# Patient Record
Sex: Male | Born: 1935 | Race: White | Hispanic: No | Marital: Married | Smoking: Never smoker
Health system: Southern US, Community
[De-identification: ages and names within clinical notes are randomized; demographics above are authoritative.]

## PROBLEM LIST (undated history)

## (undated) DIAGNOSIS — R9389 Abnormal findings on diagnostic imaging of other specified body structures: Secondary | ICD-10-CM

## (undated) DIAGNOSIS — T8859XA Other complications of anesthesia, initial encounter: Secondary | ICD-10-CM

## (undated) DIAGNOSIS — G459 Transient cerebral ischemic attack, unspecified: Secondary | ICD-10-CM

## (undated) DIAGNOSIS — K259 Gastric ulcer, unspecified as acute or chronic, without hemorrhage or perforation: Secondary | ICD-10-CM

## (undated) DIAGNOSIS — B029 Zoster without complications: Secondary | ICD-10-CM

## (undated) DIAGNOSIS — Z950 Presence of cardiac pacemaker: Secondary | ICD-10-CM

## (undated) DIAGNOSIS — I6522 Occlusion and stenosis of left carotid artery: Secondary | ICD-10-CM

## (undated) DIAGNOSIS — E119 Type 2 diabetes mellitus without complications: Secondary | ICD-10-CM

## (undated) DIAGNOSIS — N390 Urinary tract infection, site not specified: Secondary | ICD-10-CM

## (undated) DIAGNOSIS — H01009 Unspecified blepharitis unspecified eye, unspecified eyelid: Secondary | ICD-10-CM

## (undated) DIAGNOSIS — Z9289 Personal history of other medical treatment: Secondary | ICD-10-CM

## (undated) DIAGNOSIS — C801 Malignant (primary) neoplasm, unspecified: Secondary | ICD-10-CM

## (undated) DIAGNOSIS — K922 Gastrointestinal hemorrhage, unspecified: Secondary | ICD-10-CM

## (undated) DIAGNOSIS — E6609 Other obesity due to excess calories: Secondary | ICD-10-CM

## (undated) DIAGNOSIS — J9601 Acute respiratory failure with hypoxia: Secondary | ICD-10-CM

## (undated) HISTORY — DX: Abnormal findings on diagnostic imaging of other specified body structures: R93.89

## (undated) HISTORY — PX: OTHER SURGICAL HISTORY: SHX169

## (undated) HISTORY — PX: SPINE SURGERY: SHX786

## (undated) HISTORY — PX: CATARACT EXTRACTION: SUR2

## (undated) HISTORY — DX: Type 2 diabetes mellitus without complications: E11.9

## (undated) HISTORY — PX: KNEE SURGERY: SHX244

## (undated) HISTORY — DX: Personal history of other medical treatment: Z92.89

## (undated) HISTORY — DX: Other obesity due to excess calories: E66.09

## (undated) HISTORY — PX: PROSTATE BIOPSY: SHX241

## (undated) HISTORY — PX: WISDOM TOOTH EXTRACTION: SHX21

---
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
## 2000-03-02 ENCOUNTER — Encounter: Payer: Self-pay | Admitting: Family Medicine

## 2000-03-02 ENCOUNTER — Encounter: Admission: RE | Admit: 2000-03-02 | Discharge: 2000-03-02 | Payer: Self-pay | Admitting: Family Medicine

## 2000-10-01 ENCOUNTER — Encounter: Admission: RE | Admit: 2000-10-01 | Discharge: 2000-12-30 | Payer: Self-pay | Admitting: Family Medicine

## 2001-04-05 ENCOUNTER — Ambulatory Visit (HOSPITAL_COMMUNITY): Admission: RE | Admit: 2001-04-05 | Discharge: 2001-04-06 | Payer: Self-pay | Admitting: Cardiovascular Disease

## 2001-04-06 ENCOUNTER — Encounter: Payer: Self-pay | Admitting: Cardiovascular Disease

## 2003-11-19 ENCOUNTER — Inpatient Hospital Stay (HOSPITAL_COMMUNITY): Admission: AD | Admit: 2003-11-19 | Discharge: 2003-11-20 | Payer: Self-pay | Admitting: Cardiovascular Disease

## 2003-11-19 HISTORY — PX: CARDIAC CATHETERIZATION: SHX172

## 2003-12-29 ENCOUNTER — Ambulatory Visit (HOSPITAL_COMMUNITY): Admission: RE | Admit: 2003-12-29 | Discharge: 2003-12-30 | Payer: Self-pay | Admitting: Cardiovascular Disease

## 2003-12-29 HISTORY — PX: CARDIAC CATHETERIZATION: SHX172

## 2004-02-01 ENCOUNTER — Encounter (HOSPITAL_COMMUNITY): Admission: RE | Admit: 2004-02-01 | Discharge: 2004-05-01 | Payer: Self-pay | Admitting: Cardiovascular Disease

## 2004-11-15 ENCOUNTER — Emergency Department (HOSPITAL_COMMUNITY): Admission: EM | Admit: 2004-11-15 | Discharge: 2004-11-15 | Payer: Self-pay | Admitting: Emergency Medicine

## 2005-12-17 ENCOUNTER — Inpatient Hospital Stay (HOSPITAL_COMMUNITY): Admission: EM | Admit: 2005-12-17 | Discharge: 2005-12-25 | Payer: Self-pay | Admitting: Emergency Medicine

## 2009-05-22 DIAGNOSIS — Z950 Presence of cardiac pacemaker: Secondary | ICD-10-CM

## 2009-05-22 HISTORY — DX: Presence of cardiac pacemaker: Z95.0

## 2010-03-31 ENCOUNTER — Ambulatory Visit (HOSPITAL_COMMUNITY): Admission: RE | Admit: 2010-03-31 | Discharge: 2010-03-31 | Payer: Self-pay | Admitting: Cardiovascular Disease

## 2010-06-29 ENCOUNTER — Emergency Department (HOSPITAL_COMMUNITY)
Admission: EM | Admit: 2010-06-29 | Discharge: 2010-06-29 | Disposition: A | Discharge status: Home / Self Care | Payer: Medicare Other | Attending: Emergency Medicine | Admitting: Emergency Medicine

## 2010-06-29 ENCOUNTER — Emergency Department (HOSPITAL_COMMUNITY): Payer: Medicare Other

## 2010-06-29 DIAGNOSIS — I2510 Atherosclerotic heart disease of native coronary artery without angina pectoris: Secondary | ICD-10-CM | POA: Insufficient documentation

## 2010-06-29 DIAGNOSIS — Z794 Long term (current) use of insulin: Secondary | ICD-10-CM | POA: Insufficient documentation

## 2010-06-29 DIAGNOSIS — K5641 Fecal impaction: Secondary | ICD-10-CM | POA: Insufficient documentation

## 2010-06-29 DIAGNOSIS — E119 Type 2 diabetes mellitus without complications: Secondary | ICD-10-CM | POA: Insufficient documentation

## 2010-06-29 DIAGNOSIS — I10 Essential (primary) hypertension: Secondary | ICD-10-CM | POA: Insufficient documentation

## 2010-06-29 DIAGNOSIS — K5900 Constipation, unspecified: Secondary | ICD-10-CM | POA: Insufficient documentation

## 2010-06-29 DIAGNOSIS — Z950 Presence of cardiac pacemaker: Secondary | ICD-10-CM | POA: Insufficient documentation

## 2010-06-29 LAB — URINALYSIS, ROUTINE W REFLEX MICROSCOPIC
Bilirubin Urine: NEGATIVE
Hgb urine dipstick: NEGATIVE
Ketones, ur: NEGATIVE mg/dL
Nitrite: NEGATIVE
Protein, ur: NEGATIVE mg/dL
Specific Gravity, Urine: 1.01 (ref 1.005–1.030)
Urine Glucose, Fasting: NEGATIVE mg/dL
Urobilinogen, UA: 0.2 mg/dL (ref 0.0–1.0)
pH: 6.5 (ref 5.0–8.0)

## 2010-08-02 LAB — GLUCOSE, CAPILLARY
Glucose-Capillary: 137 mg/dL — ABNORMAL HIGH (ref 70–99)
Glucose-Capillary: 182 mg/dL — ABNORMAL HIGH (ref 70–99)

## 2010-08-02 LAB — SURGICAL PCR SCREEN
MRSA, PCR: NEGATIVE
Staphylococcus aureus: NEGATIVE

## 2010-10-07 NOTE — Op Note (Signed)
[name], [name]                ACCOUNT NO.:  [id number]   MEDICAL RECORD NO.:  [id number]          PATIENT TYPE:  INP   LOCATION:  2911                         FACILITY:  [facility]   PHYSICIAN:  John C. Amedeo Plenty, M.D.    DATE OF BIRTH:  1932-02-19   DATE OF PROCEDURE:  12/18/2005  DATE OF DISCHARGE:                                 OPERATIVE REPORT   PROCEDURE:  Esophagogastroduodenoscopy.   INDICATIONS FOR PROCEDURE:  Suspected upper GI bleeding.   PROCEDURE:  The patient was placed in the left lateral decubitus position  and placed on the pulse monitor with continuous low-flow oxygen delivered by  nasal cannula.  He was sedated with 20 mcg IV fentanyl and 2 mg IV Versed.  The Olympus video endoscope was advanced under direct vision in the  oropharynx and esophagus.  The esophagus was straight and of normal caliber  with squamocolumnar line sharply outlined at 38 cm.  There was no visible  esophagitis, ring, stricture, Mallory-Weiss tear, esophageal varices or  other abnormalities in the esophagus.  Just distal to the GE junction,  however, there  is a large body of blackish blue clot that remained quite  adherent and resisted suctioning despite switching to the therapeutic scope.  This occupied a significant portion of the dependent portions of the fundus  and body.  A significant amount of time was spent trying to lavage and  suction fluid and clots.  The clots generally would not come through the  scope.  I was able to get a retroflex view of the immediate cardia all the  way around the squamocolumnar line but able to see almost none of the  fundus.  The significant portions of the body and particularly the entire  lesser curvature of the body were well seen as was the antrum and pylorus  and all appeared normal.  The duodenum was entered and both the bulb and  second portion were well inspected and appeared to be within normal limits.  Despite multiple attempts, I was unable to  dislodge the clot from the  proximal stomach and could not ascertain the source of bleeding.  It was  also unclear whether there was active bleeding going on.  I never saw any  fresh bright red blood but certainly, fluid around the clots was purple to  reddish.  The scope was then withdrawn and the patient returned to the  recovery room in stable condition.  He tolerated the procedure well.  There  were no immediate complications.   IMPRESSION:  Evidence of recent or active bleeding from the proximal  stomach.  Fundus of stomach not well seen.  Gastroesophageal  junction  relatively well seen and appeared free of any Mallory-Weiss tear, varices,  esophageal ulcer etc.   PLAN:  Intensive PPI therapy, IV Reglan and probable repeat and attempted  endoscopy while resuscitating with blood and fluid products as necessary.           ______________________________  Elyse Jarvis Amedeo Plenty, M.D.     JCH/MEDQ  D:  12/18/2005  T:  12/19/2005  Job:  [id number]   cc:   Jerelene Redden, MD   Janifer Adie, M.D.  Fax: [phone]

## 2010-10-07 NOTE — Op Note (Signed)
NAMEQUANTAVIUS, [name]                ACCOUNT NO.:  [id number]   MEDICAL RECORD NO.:  [id number]          PATIENT TYPE:  INP   LOCATION:  2911                         FACILITY:  [facility]   PHYSICIAN:  John C. Amedeo Plenty, M.D.    DATE OF BIRTH:  03-Nov-1931   DATE OF PROCEDURE:  12/19/2005  DATE OF DISCHARGE:                                 OPERATIVE REPORT   Esophagogastroduodenoscopy with biopsy.   INDICATIONS FOR PROCEDURE:  Upper GI bleeding with EGD yesterday  nondiagnostic due to a large amount of clot in the fundus of the stomach.   PROCEDURE:  The patient was placed in the left lateral decubitus position  and placed on the pulse monitor with continuous low-flow oxygen delivered by  nasal cannula.  He was sedated with 50 mcg of IV fentanyl and 2.5 mg of IV  Versed.  The Olympus video endoscope was advanced under direct vision into  the oropharynx and esophagus.  The esophagus was straight and normal caliber  with squamocolumnar line at 38 cm.  There was no visible hiatal hernia, ring  stricture, Mallory-Weiss tear, or other abnormality of the GE junction.  The  stomach was entered.  There was no blood seen in the stomach at this time.  A small amount of liquid secretions are suctioned from the fundus.  Retroflexed view of the cardia was essentially unremarkable.  In the fundus,  particularly along the greater curvature and into the body, there were  streaks of erythema and granularity with one or two small faint submucosal  hemorrhages paralleling the rugal folds.  Careful prolonged inspection of  the proximal stomach revealed no other likely bleeding lesions.  Specifically, there was no ulcer seen and no Dieulafoy-type lesions  visualized.  The inflammation attenuated into the body, and the body and  antrum appeared basically normal.  I did obtain a CLOtest from normal-  appearing antral mucosa.  The pylorus was nondeformed and easily allowed  passage of the endoscope tip into the  duodenum.  Both the bulb and second  portion were well-inspected and appeared to be within normal limits.  The  scope was then withdrawn, and the patient returned to the recovery room in  stable condition.  He tolerated the procedure well, and there were no  immediate complications.   IMPRESSION:  Proximal gastritis with no obvious stigma of hemorrhage.   PLAN:  We will treat his peptic ulcer disease and observe for re-bleeding.  We will need to repeat endoscopy on demand if he has recurrent upper GI  bleeding.           ______________________________  Elyse Jarvis. Amedeo Plenty, M.D.     JCH/MEDQ  D:  12/19/2005  T:  12/19/2005  Job:  [id number]   cc:   Janifer Adie, M.D.  Fax: [phone]

## 2010-10-07 NOTE — Consult Note (Signed)
[name], ROCCHI                ACCOUNT NO.:  [id number]   MEDICAL RECORD NO.:  [id number]          PATIENT TYPE:  INP   LOCATION:  2911                         FACILITY:  [facility]   PHYSICIAN:  John C. Amedeo Cross, M.D.    DATE OF BIRTH:  January 15, 1932   DATE OF CONSULTATION:  DATE OF DISCHARGE:                                   CONSULTATION   REASON FOR CONSULTATION:  1.  Black stools.  2.  Anemia.  3.  Hypotension with elevated liver function tests.   HISTORY OF ILLNESS:  Patient is a 77 year old white male who was having  trouble as an outpatient with increasing constipation, who could not pass a  stool yesterday despite enemas, and was feeling increasingly uncomfortable  and came to the emergency room.  While he was there, he became quite dizzy  and lightheaded, and I believe had orthostatic hypotension, and also had an  elevated BUN and creatinine and was felt to be dehydrated with prerenal  azotemia.  He also had elevated AST and ALT.  He was admitted to the  hospital  and underwent disimpaction by the nurses, he says with good  results, but a large amount of black stool.  His hemoglobin on admission was  10.4, but this morning fell to 7.4.  Since he has been here, he has remained  orthostatic and is currently having supine hypotension requiring pressor  support with dopamine.  He denies any specific abdominal pain.  Denies chest  pain or shortness of breath.  He states he is not aware of having any  problems with his liver and has been on statin lipid-lowering agents for  some time and has his LFTs checked every 6 months.  On admission his  bilirubin and alkaline phosphatase were normal, but his AST was 569 and ALT  861.  These were slightly improved today.  Patient has no history of GI  bleeding that he is aware of.  He reportedly had a colonoscopy by Dr. Penelope Coop  earlier this year with findings of 1 small polyp.   PAST MEDICAL HISTORY:  1.  Coronary artery disease status post  percutaneous transluminal coronary      angioplasty in 2005.  2.  History of complete heart block with pacemaker implantation.  3.  Prostate cancer status post seed implants.  4.  Adult-onset diabetes, non-insulin-dependent.  5.  Hypertension.  6.  Hyperlipidemia.   SURGERIES:  In addition to his pacemaker implantation, he has had 2 back  operations.   FAMILY HISTORY:  Noncontributory.   SOCIAL HISTORY:  Patient lives with his wife.  He denies alcohol or tobacco  use.   PHYSICAL EXAMINATION:  GENERAL:  Obese, white male.  Skin is somewhat cool  and clammy, but no acute distress.  HEART:  Regular tachycardia.  Pulse of approximately 120.  No murmurs or  gallops or rubs.  LUNGS:  The lungs are clear anteriorly.  ABDOMEN:  Soft, slightly distended, with normoactive bowel sounds.  There is  mild diffuse tenderness which was not marked in any quadrant.   IMPRESSION:  1.  Black stools.  Anemia.  Hypotension as well as BUN, suggesting an upper      GI bleed.  2.  Elevated liver function tests, cause unclear at present.  I suppose this      could represent an ischemic hepatopathy related to congestive heart      failure or simply hypotension, and appears to be acute and not      representative of chronic  liver disease.   PLAN:  We will begin workup with EGD and proceed from there.  Further  recommendations to follow.           ______________________________  [name], M.D.     JCH/MEDQ  D:  12/18/2005  T:  12/18/2005  Job:  [id number]   cc:   Janifer Adie, M.D.  Fax: [phone]

## 2010-10-07 NOTE — Discharge Summary (Signed)
[name], [name]                            ACCOUNT NO.:  [id number]   MEDICAL RECORD NO.:  [id number]                   PATIENT TYPE:  INP   LOCATION:  6523                                 FACILITY:  [facility]   PHYSICIAN:  Richard A. Rollene Fare, M.D.          DATE OF BIRTH:  12-Sep-1931   DATE OF ADMISSION:  11/19/2003  DATE OF DISCHARGE:  11/20/2003                                 DISCHARGE SUMMARY   ADMISSION DIAGNOSES:  1. Exertional angina, positive Cardiolite, positive cardiac catheterization     with 95% left anterior descending, 70% to 80% right coronary artery     stenosis, ejection fraction of 45%.  2. History of paroxysmal atrial fibrillation and complete heart block with     permanent transvenous pacemaker, first, April 27, 1989, and second,     April 05, 2001.  Current pacer is a St. Jude Identity [id number].  3. Adult-onset diabetes mellitus, non-insulin-dependent, type 2.  4. Hypertension.  5. Hyperlipidemia.  6. Metabolic syndrome.  7. Mild elevation in creatinine, post procedure, from 1.2 to 1.5.   DISCHARGE DIAGNOSES:  1. Exertional angina, positive Cardiolite, positive cardiac catheterization     with 95% left anterior descending, 70% to 80% right coronary artery     stenosis, ejection fraction of 45%.  2. History of paroxysmal atrial fibrillation and complete heart block with     permanent transvenous pacemaker, first, April 27, 1989, and second,     April 05, 2001.  Current pacer is a St. Jude Identity [id number].  3. Adult-onset diabetes mellitus, non-insulin-dependent, type 2.  4. Hypertension.  5. Hyperlipidemia.  6. Metabolic syndrome.  7. Mild elevation in creatinine, post procedure, from 1.2 to 1.5.   PROCEDURES:  Percutaneous transluminal coronary angioplasty to the left  anterior descending with placement of a 3.5 x 28.0-mm CYPHER stent.   BRIEF HISTORY:  The patient is a 77 year old gentleman, a medical patient of  Dr. Janifer Adie,  who presented with history of chest discomfort.  He  has a long history of PAF and more specifically, complete heart block,  requiring a permanent transvenous pacemaker.  Recently, he had some  exertional chest discomfort which he minimized but Dr. Nanine Means. Rollene Fare  did a Cardiolite which was positive.  He subsequently underwent outpatient  cardiac cath on November 19, 2003 which showed a proximal 30% RCA and a distal  75% to 80% RCA stenosis.  He also had a mid 95% LAD stenosis with a 30%  stenosis just prior to that.  The patient has a 30% stenosis of his  circumflex.  EF was 45%.  The patient was subsequently transferred to [facility] and taken to the cath lab there, at which time he underwent  PTCA and CYPHER stenting of his 95% LAD lesion.   PAST MEDICAL HISTORY:  Past medical history is positive for:  1. Complete heart block with St. Jude pacer, as  noted above.  2. Adult-onset diabetes mellitus, non-insulin-dependent.  3. Hyperlipidemia.  4. Hypertension.  5. Metabolic syndrome with the patient being significantly overweight.   ALLERGIES:  None.   CURRENT MEDICATIONS:  1. Monopril 10 mg daily.  2. Aspirin 81 mg daily.  3. CQ10 daily.  4. Vytorin 10/40 daily.  5. Glucophage daily -- I do not have a dose listed.  6. Vitamin C, E and fish oil daily.  7. Toprol-XL -- dose is not listed.   ALLERGIES:  None.   FAMILY HISTORY AND SOCIAL HISTORY:  Please see old chart.   HOSPITAL COURSE:  The patient was admitted and underwent PTCA of his LAD  lesion, along with placement of a CYPHER stent.  He was placed on Aggrastat  and fluids and transferred to the floor.  He had done well, he has had no  arrhythmias and the following a.m., it was Dr. Lowella Fairy opinion he could  be discharged home.  His creatinine was elevated at 1.5 and we plan to  encourage fluids over the weekend and recheck it next week after the holiday  on November 24, 2003.   DISCHARGE MEDICATIONS:  1.  Vytorin 10/40 one daily.  2. Monopril 10 mg daily.  3. Aspirin 81 mg daily.  4. Lopressor 25 mg b.i.d.  5. Plavix 75 mg daily.  6. He is to resume his Glucophage tomorrow, November 21, 2003.  7. He was given Amaryl 2 mg today.  8. He was also given a prescription for nitroglycerin 1/150 SL p.r.n. and     instructed on how to use it and to call if he needs to use 2 or more.   DISCHARGE ACTIVITY:  Light-to-moderate, no lifting over 10 pounds, no  driving, no strenuous activity for 72 hours.  He is to walk daily.   DIET:  Low-fat diabetic diet.   SPECIAL DISCHARGE INSTRUCTIONS:  He is to call if he has any problems with  his cath site.   FOLLOWUP:  He will have his BMP checked on November 24, 2003.  He will return to  the office for an office check on Wednesday, November 25, 2003, at 10:30.  He is  scheduled for a Cardiolite stress test on December 11, 2003 at 9 a.m. and he  will follow up with Dr. Rollene Fare after that.   LABORATORY DATA:  Please note, creatinine was elevated to 1.5, post  procedure; it was 1.2 prior to procedure.  BUN is 15.  His electrolytes are  normal.  Hemoglobin is 14.1, hematocrit is 40, white count is 9.4, platelets  are 171,000.      Lydia Guiles, P.A.                 Richard A. Rollene Fare, M.D.    WDJ/MEDQ  D:  11/20/2003  T:  11/21/2003  Job:  [id number]   cc:   Janifer Adie, M.D.  [place]  [place] [address]  Fax: [phone]

## 2010-10-07 NOTE — H&P (Signed)
[name], [name] NO.:  [id number]   MEDICAL RECORD NO.:  [id number]          PATIENT TYPE:  EMS   LOCATION:  MAJO                         FACILITY:  [facility]   PHYSICIAN:  Jerelene Redden, MD      DATE OF BIRTH:  July 26, 1931   DATE OF ADMISSION:  12/17/2005  DATE OF DISCHARGE:                                HISTORY & PHYSICAL   HISTORY OF PRESENT ILLNESS:  [name] is a 77 year old man who states  that as he has gotten older, he has had increasing problems with  constipation.  He has been taking stool softeners, Ex-Lax, about once a  month, and giving himself tap water enemas.  In spite of these measures, he  continues to feel that he is having problems with constipation.  Today, he  felt quite weak and tired.  For the last several days, he has noted a  feeling of bloating of the abdomen.  He feels that he is more short of  breath with exertion because of this feeling of bloating.  He tried to pass  a stool today and became extremely dizzy and lightheaded.  He was,  therefore, brought into the [facility] emergency room where his blood pressure was  initially 100/72, pulse was 101.  Laboratory studies obtained were  remarkable for a creatinine of 1.48 and a BUN of 60, suggesting marked  prerenal azotemia.  A KUB and upright abdomen were obtained, which did not  show any acute process.  An effort was made to disimpact Mr. Bramlett, but  that was not very successful.  When Dr. Winfred Leeds tried to have Mr. Hearing  stand up, he became extremely dizzy, and it was felt to be unsafe to  discharge him from the emergency room.  Therefore, he will be admitted for  further observation and treatment.  He denies any recent history of  headache, breathing difficulty, orthopnea, PND, chest pain, vomiting,  hematemesis or melena.  Of note is that he had a colonoscopy done about 6  months ago by one of the [facility] doctors, and the only finding was a small  polyp.   PAST MEDICAL  HISTORY:   CURRENT MEDICATIONS:  1.  Monopril 10 mg daily.  2.  Vytorin 10/80 one daily.  3.  Aspirin 81 mg daily.  4.  Lopressor 25 mg b.i.d.  5.  Plavix 75 mg daily.  6.  Avandia 8 mg daily.  7.  Glucotrol XL 10 mg daily.  8.  Nitroglycerin p.r.n. for chest pain.  9.  Dulcolax p.r.n. for constipation as noted above.   ALLERGIES:  He has no known drug allergies.   MEDICAL ILLNESSES:  1.  Coronary artery disease.  The patient was evaluated at [facility] in      June of 2005 after having a positive Cardiolite study.  He underwent      cardiac catheterization and was found to have a 95% LAD lesion, as well      as an 80% right coronary lesion.  Ejection fraction was 45%.  Therefore,      he  underwent a PTCA to the LAD with placement of a 3.5-mm Cypher stent.      He tolerated this procedure well and since then has had a stress test      done which he reports looked good.  2.  History of complete heart block.  The patient has a past history of      paroxysmal atrial fibrillation and complete heart block which was first      diagnosed in December of 1990.  He has had 2 pacemakers placed for this      problem and currently has a St. Jude type pacemaker.  3.  Other surgical procedures have included placement of seed implants for      prostate cancer, 2 previous back operations.   OTHER MEDICAL ILLNESSES:  1.  Adult onset diabetes.  The patient states his A1c hemoglobins have been      less than 7.  He says he has not had any problems with his vision or      with kidney disease related to diabetes. to hypertension.  2.  Hypertension.  3.  Hyperlipidemia.   FAMILY HISTORY:  Noncontributory.   SOCIAL HISTORY:  The patient lives with his wife.  He does not smoke.  He  does not consume alcohol.  He does not abuse drugs.   REVIEW OF SYSTEMS:  HEAD:  He denies headache or dizziness.  EYES:  He  denies visual blurring or clear.  EAR/NOSE/THROAT:  Denies earache, sinus  pain or sore  throat.  CHEST:  Denies coughing, wheezing or chest congestion.  CARDIOVASCULAR:  See above.  GI:  See above.  GU:  Denies dysuria, urinary  frequency, hesitancy or nocturia.  RHEUMATOLOGIC:  Denies back pain or joint  pain.  HEMATOLOGIC:  Denies easy bleeding or bruising.  NEUROLOGIC:  Denies  history of seizure or stroke.   PHYSICAL EXAMINATION:  GENERAL:  He is a pleasant obese man who looks  somewhat pale.  HEENT:  Within normal limits.  CHEST:  Clear.  BACK:  No CVA or point tenderness.  CARDIOVASCULAR:  Normal S1-S2.  There are no rubs, murmurs or gallops.  Heart rate is about 90.  ABDOMEN:  The abdomen is obese.  It looks a little bit distended.  It is  nontender.  There is no guarding or rebound.  RECTAL:  There is stool in the rectum, which is somewhat hard.  No blood is  present.  NEUROLOGIC TESTING:  Cranial nerves, motor, sensory and cerebellar testing  is normal.  EXTREMITIES:  No evidence of cyanosis or edema.   IMPRESSION:  1.  Constipation, acute and chronic.  2.  Dehydration.  3.  History of hypertension.  4.  Coronary artery disease status post stents to the left anterior      descending.  5.  Hyperlipidemia.  6.  History of prostate cancer status post seed implants.  7.  History of back surgery,  8.  History of complete heart block status post pacemaker.  9.  Diabetes.  10. Obesity.   PLAN:  Will admit the patient for observation and treatment.  Will start IV  fluids with normal saline at 100 cc/hour.  Will try to give him a treatment  for the constipation using mag citrate or possibly Fleet's enema.  Will make  sure to continue his usual medicines and will monitor his blood sugars  closely.           ______________________________  Jerelene Redden, MD  SY/MEDQ  D:  12/17/2005  T:  12/17/2005  Job:  [id number]   cc:   Janifer Adie, M.D.  Fax: [place]. Rollene Fare, M.D.  Fax: [phone]

## 2010-10-07 NOTE — Cardiovascular Report (Signed)
[name], Cross                            ACCOUNT NO.:  [id number]   MEDICAL RECORD NO.:  [id number]                   PATIENT TYPE:  OIB   LOCATION:  6526                                 FACILITY:  [facility]   PHYSICIAN:  [name], M.D.          DATE OF BIRTH:  11-01-1931   DATE OF PROCEDURE:  12/29/2003  DATE OF DISCHARGE:                              CARDIAC CATHETERIZATION   PROCEDURE:  Retrograde central aortic catheterization, selective coronary  angiography via Judkins technique, pre and post IC nitroglycerin  administration, direct stenting high grade mid right coronary artery  stenosis, Aggrastat bolus plus infusion, weight-adjusted heparin, continued  aspirin and Plavix therapy.   INDICATIONS FOR PROCEDURE:  Please refer to associated notes and prior  catheterization report and PCI report of November 19, 2003.  The patient is a  longterm patient of ours who is married with two children and two  grandchildren.  He is a Psychologist, forensic, nonsmoker, has exogenous  obesity, AODM, hyperlipidemia, and metabolic syndrome.  He is status post  PTVP for complete heart block in 1990 and had end-of-life generator change  on April 05, 2001, with a St. Jude Identity DDD pulse generator.  He was  seen in June and had complaints of several weeks of exertional chest  discomfort.  He had a positive Cardiolite showing anterolateral ischemia and  inferior ischemia.  He underwent outpatient catheterization at [facility] on November 19, 2003, and this showed a 70 to 80% mid right  coronary artery lesion and a 9% eccentric LAD lesion after the large second  diagonal.  He had noncritical circumflex disease with 90% lesion of a small  PAVG branch.  EF was approximately 45% with anterolateral hypokinesis and  pacer-induced LBBB.  He underwent PCI with stenting of the LAD with a 3.5/28  Cypher stent.  He had ostial DX2 side branch narrowing which was dilated  with a 3.0/8  balloon with good result and good flow.  He subsequently had a  follow-up Cardiolite which continued to show inferior ischemia.  He was  followed closely post procedure because of AODM and renal function and  creatinine transiently increased from 1.2 to 1.5.  He was scheduled for  staged elective PCI intervention in this setting.   He was hydrated preoperatively, given bicarbonate drip.  Diuretics and ACE  inhibitors were held on the day of the procedure.  Glucophage was held and  he received Mucomyst preoperatively.   DESCRIPTION OF PROCEDURE:  He was brought to the second floor CP lab in the  postabsorptive state after 5 mg of Valium p.o.  The right groin was prepped  and draped in the usual fashion.  1% Xylocaine was used for local anesthesia  and the CFRA was entered with a single anterior puncture using an 18 thin  wall needle.  A 6 French short Daig sidearm sheath was inserted without  difficulty.  BUN  and creatinine was 29/1.5 on December 23, 2003.  Selective  right coronary angiography was done with a 6 French 4 cm taper right  coronary guiding catheter.  This demonstrated approximately 70 to 75% mildly  segmental stenosis in the mid RCA before the acute margin.  There were  tandem 30% lesions of the RCA proximal and distal to a large RV branch in  the midvessel followed by intercurrent normal appearing artery and then the  stenosis as described.  There is good flow to the distal RCA, PDA, and PLA  branches.   The patient was given Aggrastat bolus plus infusion.  Limited dye was used  of Visipaque.  He was given an extra 150 mg of Plavix and he had been on  Plavix and aspirin as an outpatient.  He was given 2 mg of Nubain and 2 mg  of Versed in divided doses for sedation.  Visipaque dye was used throughout  the procedure.  The lesion was crossed with a Asahi 0.014 inch guide wire  and then crossed with a 3.5 x 16 mm Taxis Express II Scimed stent.  It was  positioned  fluoroscopically and deployed at 10/35, postdilated to 12/42.  Balloon was easily removed and final right coronary injections showed good  position of the stent.  The lesion was fully covered and there was 0%  residual stenosis and TIMI 3 flow to the distal right coronary artery.   The catheter was removed and diagnostic selective left coronary angiography  was done in the LAO and RAO cranial projections to assess the patient's  recently placed stent post DX2.   The stent was widely patent with 0% residual stenosis and good flow to the  distal LAD.  The DX2 which arose just proximal to the stent was widely  patent with approximately 30 to 40% narrowing and good flow.  There did  appear to be probably diffuse disease of the LAD in the midportion, but no  high grade angiographic stenosis.   The catheter was removed, sidearm sheath was flushed.  Final ACT was 303  seconds.  The patient had been given weight-adjusted heparin 5000 units  prior to the procedure, along with Aggrastat 25 mcg/kg bolus and then  infusion.   The patient has also had a history of CA of the prostate treated remotely  with seed implant.  After his last catheterization, he required a Foley  catheter and had mild hematuria probably related to the Foley and  anticoagulant therapy.  As an outpatient, he had dysuria with microscopic  hematuria and pyuria and urine culture showed Escherichia coli.  He was  treated with a course of Cipro in early July and his symptoms resolved.   The patient has had successful staged PCI of his mid right coronary stenosis  with angina and positive Cardiolite as outlined above.  We recommend his LAD  with DES stent.  His LAD DES stent is widely patent on this study from his  staged November 19, 2003, procedure.  Continue medical therapy.   CARDIAC ASSESSMENT:  1. ASHD - stable accelerated angina with positive Cardiolite, June of 2005. 2. Successful DES stenting high grade proximal LAD  stenosis and side branch     DX2 POBA, November 19, 2003.  3. Follow-up positive Cardiolite with inferior ischemia and angina, improved     - staged RCA PCI, December 29, 2003, DES stent mid RCA, widely patent LAD     stent on restudy.  4. Adult onset diabetes  mellitus.  5. Exogenous obesity.  6. Hypertriglyceridemia.  7. Hyperlipidemia under therapy.  8. Remote cancer of the prostate, status post seed implant.  9. Urinary tract infection, Escherichia coli, treated in July of 2005, no     symptoms recurrence.  10.      PTVP, April 27, 1989, with EOL generator replacement on April 05, 2001, for heart block.   It should be noted that during the procedure, the patient was AV pacing with  full capture.  Blood pressures were monitored as was EKG and blood pressures  remained approximately 110 to 120 mmHg.  It transiently dropped to just  below 100 with IC nitroglycerin 200 mcg in the right coronary artery and  then came back up. The patient tolerated this well.                                               [name], M.D.    RAW/MEDQ  D:  12/29/2003  T:  12/30/2003  Job:  [id number]   cc:   Janifer Adie, M.D.  [place]  [place] [address]  Fax: [phone]   Bernestine Amass, M.D.  [place]. [address], [address] [place]  [place] [address]  Fax: [phone]

## 2010-10-07 NOTE — Cardiovascular Report (Signed)
NAMEJERMELLE, [name]                            ACCOUNT NO.:  [id number]   MEDICAL RECORD NO.:  [id number]                   PATIENT TYPE:  INP   LOCATION:  6523                                 FACILITY:  [facility]   PHYSICIAN:  Richard A. Rollene Fare, M.D.          DATE OF BIRTH:  May 11, 1932   DATE OF PROCEDURE:  11/19/2003  DATE OF DISCHARGE:                              CARDIAC CATHETERIZATION   PROCEDURES PERFORMED:  1. Retrograde central aortic catheterization.  2. Selective left coronary angiography via Judkins technique.  3. Intracoronary nitroglycerin administration.  4. Percutaneous transluminal coronary angioplasty, high-grade left anterior     descending stenosis, predilatation and subsequent drug-eluting stent     3.5/28 Cypher.  5. Side branch percutaneous transluminal coronary angioplasty, diagonal-2,     ostial with 3.0/8 balloon.  6. Right coronary angiogram.  7. Double bolus Aggrastat plus infusion, 5,000 units of heparin     intravenously and 150 mg Plavix per os.   CARDIOLOGIST:  Richard A. Rollene Fare, M.D.   BRIEF HISTORY:  Please refer to H&P of November 19, 2003.   [name] is a 77 year old married father  of two with two grandchildren  who is a nonsmoker and a self-employed Arboriculturist.  He has a history of  hypertension, hyperlipidemia, metabolic syndrome, recent diagnosis of AODM,  remote CA of the prostate status post radioactive seed implant without  recurrence, exogenous obesity, and permanent transvenous pacemaker for  symptomatic CHB, April 27, 1989 with end of life generator change  April 05, 2001.  He also has a chronic history of atypical chest pain  remotely with negative Cardiolite in 2003.  He has had three to four months  of exertional angina, seen as an outpatient November 13, 2003 and with positive  Cardiolite, anterolateral and inferior, November 18, 2003.  Outpatient  catheterization at [facility] revealed a 95% mildly eccentric,  mildly  segmental stenosis beyond the large second diagonal and first septal  perforator, noncritical circumflex disease, short main, left, and borderline  75% mid right coronary stenosis.  There was anterolateral hypokinesis  compatible with his LAD lesion and stunned myocardium.  The patient was  transferred to [facility].  He was given 600 mg of Plavix almost five to six hours  prior to the procedure, had been on continued aspirin and was given bolus  heparin pending PCI.   DESCRIPTION OF PROCEDURE:  Informed consent was obtained from the patient  and hi wife to proceed with PCI.  Foley catheter was inserted with over a  liter on IV hydration.  Metformin had been held on the day of the procedure.  The patient was brought to the second floor CP lab in the post absorptive  state.  He was premedicated with 5 mg of Valium p.o.  The previously placed  5 French sheath was sterilely exchanged for an upgraded 6 [place]  short Daig  sidearm sheath under 1% Xylocaine anesthesia.  He was given 2 mg of Versed  and 2 mg of Nubain for sedation in the laboratory.  The left coronary was  intubated with a 6 [place] JL-3.0 guide catheter because of the short aortic  root.  The patient was given 5,000 units of heparin and monitoring ACTs,  which were therapeutic.  The high-grade LAD stenosis was then crossed with a  3.0/8 Guidant Voyager balloon.  The lesion was predilated at seven  atmospheres for 30 seconds.  There was a 95% eccentric lesion beyond the  septal perforator-1, but just beyond the DX-2 there was tandem 50%  narrowing.  Both these areas were covered with a 3.5/28 DE Cordis Cypher  stent positioned beyond the origin of the DX-2.  The stent was deployed  under fluoroscopic control at 10-28 and post dilated at 13-35.  The balloon  was pulled back and injection showed 70% narrowing of the ostium of the DX-  2, probably related to balloon inflation and plaque shift.  There was  excellent stent deployment with 0%  residual stenosis and good TIMI III flow  to the distal LAD, which coursed to the apex of the heart and bifurcated.   The guidewire was pulled back and positioned in the DX-2.  The ostium  of  the DX-2 was the dilated with the same 3.0/8 Voyager balloon at low  atmospheres from 5-6 for 20-30 seconds.  The balloon was pulled back and  final injection showed stenosis reduction of 70 to less than 30. There was a  very minimal tear beyond the balloon dilatation site, but this was not flow-  limiting and there was no significant stenosis, so it was elected not to  address this.   The patient will be continued on Aggrastat for 18 hours, and kept on aspirin  and Plavix.  The final ACT was 319 seconds.   Following PCI right coronary angiography was done with a 6 [place] JL-4  diagnostic catheter.  Angiography of the right coronary artery demonstrated  approximately 70% narrowing, concentric, slightly hypodense with no visible  thrombus and good flow to the distal RCA.   DISCUSSION:  The patient has had successful PTCA and subsequent DES stenting  of tandem LAD lesions beyond the large DX-2.  He had ostial pinching of the  DX-2 ostium that was dilated successfully with a 3.0 balloon with good  result as outlined above.  He has residual coronary artery disease, which  angiographically appears to be approximately 70%, slightly hypodense.  This  is a borderline lesion; however, the patient did have inferior wall  ischemia, fairly significant, separate from his anterior wall ischemia on  recent Cardiolite;  and, I suspect that this is a hemodynamically  significant lesion.   The patient will be followed up medically and we will repeat his Cardiolite  for resolution of anterior ischemia.  It there is clinical inferior ischemia  in view of his risk factors as outlined he would be a candidate for staged  intervention.   CATHETERIZATION DIAGNOSES: 1. Atherosclerotic heart disease - stable exertional  angina with positive     Cardiolite showing anterior and inferior ischemia.  2. Successful percutaneous transluminal coronary angioplasty and drug-     eluting stent, high-grade tandem proximal left anterior descending     stenosis beyond the diagonal-2.  3. Ostial diagonal-2 stenosis post stent, treated successfully with balloon     dilatation.  4. Seventy percent or greater mid right coronary stenosis with inferior  ischemia on Cardiolite.  5. Adult-onset diabetes mellitus.  6. Exogenous obesity.  7. Permanent transvenous pacemaker implant April 27, 1989, end of life     implant April 05, 2001 for symptomatic chronic heart block with     alternating right and left bundle branch block.  8. Anterior wall ischemia and stunning on angiography; follow up pending     post percutaneous coronary     intervention.  9. Short episodes of paroxysmal atrial fibrillation on pacer telemetry, to     be followed up, asymptomatic.  10.      Hyperlipidemia on therapy.                                               Richard A. Rollene Fare, M.D.    RAW/MEDQ  D:  11/19/2003  T:  11/20/2003  Job:  [id number]   cc:   Janifer Adie, M.D.  [place]  [place] [address]  Fax: [phone]   CP Laboratory

## 2010-10-07 NOTE — Op Note (Signed)
[place]  Patient:    [name], [name] Visit Number: [id number] MRN: [id number]          Service Type: CAT Location: 2000 2039 [id number] Attending Physician:  Epifanio Lesches Dictated by:   Nanine Means Rollene Fare, M.D. Proc. Date: 04/05/01 Admit Date:  04/05/2001   CC:         Janifer Adie, M.D.  Alla German, M.D.  [facility] lab   Operative Report  PROCEDURE:  Explantation of end of life expired DDD Paragon II pacesetter number [phone] pulse generator [phone]).  Implantation of new pacesetter, St. Jude medical Identity XL-GR, model number [id number]; serial number [id number].  IMPLANTING PHYSICIAN:  Richard A. Rollene Fare, M.D.  COMPLICATIONS:  None.  ESTIMATED BLOOD LOSS:  Approximately 20 cc.  ANESTHESIA:  Valium 5 mg p.o. premedication, 1% local Xylocaine, 4 mg of Nubain in divided doses during procedure for sedation.  PREOPERATIVE DIAGNOSES: 1. Complete heart block with intermittent left bundle branch block and sinus    rhythm requiring DDD pacesetter Paragon II post generator [id number]. 2. End of life Paragon II pulse generator with generator reaching RRT, and    drop in magnet rate to 86, and increased battery impedance to 48 killaohms. 3. Implantation of new Identity XLDR, post generator [phone], St. Jude medical    03/26/01. 4. Exogenous obesity. 5. Adult onset diabetes mellitus, on medical therapy. 6. Left bundle branch block. 7. Carcinoma of the prostate, status post seed implantation. 8. Systemic hypertension. 9. Hyperlipidemia.  POSTOPERATIVE DIAGNOSES:  Same as above.  EXISTING ELECTRODES:  Implant 04/27/89:  Atrial in line coaxial bipolar passive fixation, model 1022T; serial number [id number].  VENTRICULAR ELECTRODE:  St. Jude model number [id number]; serial number [id number], implanted 04/27/89.  In line coaxial bipolar silicone.  THRESHOLDS: 1. Unipolar atrial:  P=3.5 mv, threshold 0.6 V, impedance 500 ohms. 2. Unipolar ventricular:  R=8.0  mv, minimal threshhold 0.8 V, 420 ohms. 3. Bipolar atrial:  P=3.8 mv, threshhold 0.9 V, impedence 480 ohms. 4. Bipolar ventricular:  R=10.2 mv, minimal threshhold 0.9 V, impedence 540    ohms.  There was retrograde wenkebach at a ventricular pacing rate of 130 with atrial recording.  There was short retrograde VA conduction up to this point of 230 milliseconds.  MAGNET RATE:  BOL=98.6; magnet rated RRT=86.3; magnet rated EOL=68.  GENERATOR:  DDDR multiprogrammable rate adaptive pulse generator with stored electrograms.  DESCRIPTION OF PROCEDURE:  The patient was brought to the second floor CP lab, room #2, in the postabsorptive state after 5 mg of Valium p.o. premedication. The right anterior chest was prepped, draped in usual manner.  Xylocaine 1% was used for local anesthesia.  Nubain 2 mg were given x 2 for sedation during the procedure.  Skin and subcutaneous tissue were infiltrated with 1% Xylocaine, and a right infraclavicular curvilinear incision was performed above the previously placed incision.  It was brought down through the subcutaneous tissue, and to the fibrous capsule using blunt dissection. Fibrous capsule was opened, generator freed up with adequate Xylocaine infiltration.  The generator was freed up and delivered from the pocket.  The patient had been programmed bipolar output so he would continue pacing. Threshold testing was performed showing excellent chronic unipolar and bipolar threshold, R wave and P wave measurements.  The patient had underlying complete heart block, and was not pacer dependent, but had a ventricular response in the 40s without pacing.  Electrocautery was used carefully to control hemostasis.  Threshold testing was performed.  Inspection  of the electrodes showed no abnormalities of the visible electrode loops and pin connectors.  Retrograde conduction study was done with ventricular pacing and atrial recordings through the  implanted electrodes.  Generator pocket was irrigated with 500 mg of kanamycin solution. The new generator was hooked to the electrodes in proper AV sequence with a single hex nut tightened, and delivered into the pocket.  Sponge count was correct.  The subcutaneous tissue was closed with two separate running layers of 2-0 Dexon suture, and the skin was closed with 5-0 subcuticular Dexon suture.  Prior to implantation of the pacemaker, the portion of the anterior fibrous capsule which was redundant was removed surgically.  The Steri-Strips were applied.  Fluoroscopy showed good position of the generator, no electrode damage, and excellent position of the atrial and ventricular electrodes in the RV apex and the right atrial appendage that had not changed.  The patient was transferred to the holding area for postoperative programming in stable condition. Dictated by:   Nanine Means Rollene Fare, M.D. Attending Physician:  Epifanio Lesches DD:  04/05/01 TD:  04/05/01 Job: [id number]

## 2010-10-07 NOTE — Discharge Summary (Signed)
[name], [name]                ACCOUNT NO.:  [id number]   MEDICAL RECORD NO.:  [id number]          PATIENT TYPE:  INP   LOCATION:  [id number]                         FACILITY:  [facility]   PHYSICIAN:  Benito Mccreedy, M.D.DATE OF BIRTH:  1932/01/26   DATE OF ADMISSION:  12/17/2005  DATE OF DISCHARGE:  12/25/2005                                 DISCHARGE SUMMARY   DISCHARGE DIAGNOSES:  1. Upper gastrointestinal bleeding resolved status post EGD biopsy      indicating proximal gastritis with no evidence of hemorrhage.      Discharge done on December 19, 2005.  Prior EGD done on December 18, 2005      indicated evidence of recent or active bleeding from the proximal      acestoma.  Fundus not well visualized.  This is anemia blood loss with      discharge hemoglobin of 12.1.  2. Abnormal liver function testing with negative preliminary ultrasound      report of the liver.  Full report to be followed up by PCP.  Likely      secondary to hepatic ishemic related to hypertensive episode.  Overall      improving at this time.  Followup recommended.  Discharge AST of 127      and ALT of 304.  3. History of hypertension, diabetes, dyslipidemia, coronary artery      disease, history of back surgery, history of obesity.   DISCHARGE MEDICATIONS:  The patient is going to resume all of his  preadmission medications except for the following medicines; Monopril,  aspirin and Plavix.  These are currently on hold and will be started after a  followup appointment if deemed appropriate.  New medicine is Protonix 40 mg  p.o. b.i.d.   CONSULTATIONS:  Dr. Amedeo Plenty   PROCEDURE:  EGD.   CONDITION ON DISCHARGE:  Satisfactory.   REASON FOR ADMISSION:  GI bleed.  Please see H & P dictated on December 07, 2005.   HISTORY:  The patient is a 77 year old gentleman who presented with GI  bleed.  He was hypotensive.  He had also had it on admission.  The patient  was admitted to hospitalist service.  The patient had EGD done  which is  listed above and actually was repeated the next day.  The patient's LFTs  were noted to be high and were thought to be related to ischemic hepatic  injury because of hypertensive episodes.  The patient is also on  dyslipidemic/statins however his liver function tests have continued to  improve with careful followup and after rehydration this is due to  hypertension rather than any Statin related injury or any significant  hepatic injury probably from the hypertension.  The patient's AST and ALT  have continued to improve and he will need to continue outpatient followup.  Ultrasound was done which came back negative.  Patient is fine, no history  of fever or chills.  His antihypertensive medications were held yesterday  because his blood pressures were low though there was no evidence of any  ongoing active bleed.  We therefore  increased the antihypertensive  medications and they seemed to have not done well.  Systolic blood pressure  more than 160 today and we are going to resume his Lopressor but we are  going to hold his Monopril and need followup as recommended.   REVIEW OF SYSTEMS:  Unremarkable.  He is alert and appropriate.  His bowels  are moving fine without any evidence of melena or hematochezia.  He is  ambulatory without any dizziness.   DISCHARGE LABS:  Sodium of 141, potassium 4.2, chloride 105, CO2 30, glucose  139, BUN 24, creatinine 1.6, bilirubin 0.5, alkaline phosphatase 67, AST  125, ALT 304.  Total protein 5.9, albumin 2.9, calcium 8.7.  The patient is  to followup with Dr. Amedeo Plenty in 7-10 days as well as PCP.  He was discharged  in stable satisfactory condition.      Benito Mccreedy, M.D.  Electronically Signed     GO/MEDQ  D:  12/25/2005  T:  12/25/2005  Job:  [id number]

## 2011-07-04 DIAGNOSIS — M79609 Pain in unspecified limb: Secondary | ICD-10-CM | POA: Diagnosis not present

## 2011-07-04 DIAGNOSIS — E1049 Type 1 diabetes mellitus with other diabetic neurological complication: Secondary | ICD-10-CM | POA: Diagnosis not present

## 2011-07-04 DIAGNOSIS — B351 Tinea unguium: Secondary | ICD-10-CM | POA: Diagnosis not present

## 2011-07-15 DIAGNOSIS — I495 Sick sinus syndrome: Secondary | ICD-10-CM | POA: Diagnosis not present

## 2011-07-18 DIAGNOSIS — I495 Sick sinus syndrome: Secondary | ICD-10-CM | POA: Diagnosis not present

## 2011-07-18 DIAGNOSIS — I2510 Atherosclerotic heart disease of native coronary artery without angina pectoris: Secondary | ICD-10-CM | POA: Diagnosis not present

## 2011-07-18 DIAGNOSIS — E119 Type 2 diabetes mellitus without complications: Secondary | ICD-10-CM | POA: Diagnosis not present

## 2011-07-20 DIAGNOSIS — R5381 Other malaise: Secondary | ICD-10-CM | POA: Diagnosis not present

## 2011-07-20 DIAGNOSIS — Z79899 Other long term (current) drug therapy: Secondary | ICD-10-CM | POA: Diagnosis not present

## 2011-07-20 DIAGNOSIS — E119 Type 2 diabetes mellitus without complications: Secondary | ICD-10-CM | POA: Diagnosis not present

## 2011-07-20 DIAGNOSIS — D649 Anemia, unspecified: Secondary | ICD-10-CM | POA: Diagnosis not present

## 2011-07-20 DIAGNOSIS — E782 Mixed hyperlipidemia: Secondary | ICD-10-CM | POA: Diagnosis not present

## 2011-07-31 DIAGNOSIS — H269 Unspecified cataract: Secondary | ICD-10-CM | POA: Diagnosis not present

## 2011-07-31 DIAGNOSIS — I10 Essential (primary) hypertension: Secondary | ICD-10-CM | POA: Diagnosis not present

## 2011-07-31 DIAGNOSIS — E1149 Type 2 diabetes mellitus with other diabetic neurological complication: Secondary | ICD-10-CM | POA: Diagnosis not present

## 2011-07-31 DIAGNOSIS — K253 Acute gastric ulcer without hemorrhage or perforation: Secondary | ICD-10-CM | POA: Diagnosis not present

## 2011-07-31 DIAGNOSIS — E559 Vitamin D deficiency, unspecified: Secondary | ICD-10-CM | POA: Diagnosis not present

## 2011-07-31 DIAGNOSIS — R6889 Other general symptoms and signs: Secondary | ICD-10-CM | POA: Diagnosis not present

## 2011-07-31 DIAGNOSIS — E782 Mixed hyperlipidemia: Secondary | ICD-10-CM | POA: Diagnosis not present

## 2011-09-12 DIAGNOSIS — M79609 Pain in unspecified limb: Secondary | ICD-10-CM | POA: Diagnosis not present

## 2011-09-12 DIAGNOSIS — B351 Tinea unguium: Secondary | ICD-10-CM | POA: Diagnosis not present

## 2011-10-14 DIAGNOSIS — I495 Sick sinus syndrome: Secondary | ICD-10-CM | POA: Diagnosis not present

## 2011-11-22 DIAGNOSIS — Z79899 Other long term (current) drug therapy: Secondary | ICD-10-CM | POA: Diagnosis not present

## 2011-11-22 DIAGNOSIS — I2510 Atherosclerotic heart disease of native coronary artery without angina pectoris: Secondary | ICD-10-CM | POA: Diagnosis not present

## 2011-11-22 DIAGNOSIS — I471 Supraventricular tachycardia, unspecified: Secondary | ICD-10-CM | POA: Diagnosis not present

## 2011-11-22 DIAGNOSIS — R5383 Other fatigue: Secondary | ICD-10-CM | POA: Diagnosis not present

## 2011-11-22 DIAGNOSIS — R5381 Other malaise: Secondary | ICD-10-CM | POA: Diagnosis not present

## 2011-12-05 DIAGNOSIS — B351 Tinea unguium: Secondary | ICD-10-CM | POA: Diagnosis not present

## 2011-12-05 DIAGNOSIS — M79609 Pain in unspecified limb: Secondary | ICD-10-CM | POA: Diagnosis not present

## 2012-01-13 DIAGNOSIS — I495 Sick sinus syndrome: Secondary | ICD-10-CM | POA: Diagnosis not present

## 2012-02-06 DIAGNOSIS — E1129 Type 2 diabetes mellitus with other diabetic kidney complication: Secondary | ICD-10-CM | POA: Diagnosis not present

## 2012-02-06 DIAGNOSIS — Z125 Encounter for screening for malignant neoplasm of prostate: Secondary | ICD-10-CM | POA: Diagnosis not present

## 2012-02-06 DIAGNOSIS — Z0000 Encounter for general adult medical examination without abnormal findings: Secondary | ICD-10-CM | POA: Diagnosis not present

## 2012-02-06 DIAGNOSIS — Z23 Encounter for immunization: Secondary | ICD-10-CM | POA: Diagnosis not present

## 2012-02-06 DIAGNOSIS — E1165 Type 2 diabetes mellitus with hyperglycemia: Secondary | ICD-10-CM | POA: Diagnosis not present

## 2012-02-06 DIAGNOSIS — E782 Mixed hyperlipidemia: Secondary | ICD-10-CM | POA: Diagnosis not present

## 2012-02-06 DIAGNOSIS — Z8546 Personal history of malignant neoplasm of prostate: Secondary | ICD-10-CM | POA: Diagnosis not present

## 2012-02-06 DIAGNOSIS — E559 Vitamin D deficiency, unspecified: Secondary | ICD-10-CM | POA: Diagnosis not present

## 2012-02-06 DIAGNOSIS — N183 Chronic kidney disease, stage 3 unspecified: Secondary | ICD-10-CM | POA: Diagnosis not present

## 2012-02-06 DIAGNOSIS — Z79899 Other long term (current) drug therapy: Secondary | ICD-10-CM | POA: Diagnosis not present

## 2012-02-20 DIAGNOSIS — B351 Tinea unguium: Secondary | ICD-10-CM | POA: Diagnosis not present

## 2012-02-20 DIAGNOSIS — M79609 Pain in unspecified limb: Secondary | ICD-10-CM | POA: Diagnosis not present

## 2012-03-28 DIAGNOSIS — E119 Type 2 diabetes mellitus without complications: Secondary | ICD-10-CM | POA: Diagnosis not present

## 2012-03-28 DIAGNOSIS — Z45018 Encounter for adjustment and management of other part of cardiac pacemaker: Secondary | ICD-10-CM | POA: Diagnosis not present

## 2012-03-28 DIAGNOSIS — I2510 Atherosclerotic heart disease of native coronary artery without angina pectoris: Secondary | ICD-10-CM | POA: Diagnosis not present

## 2012-03-28 DIAGNOSIS — I10 Essential (primary) hypertension: Secondary | ICD-10-CM | POA: Diagnosis not present

## 2012-04-30 DIAGNOSIS — M79609 Pain in unspecified limb: Secondary | ICD-10-CM | POA: Diagnosis not present

## 2012-04-30 DIAGNOSIS — B351 Tinea unguium: Secondary | ICD-10-CM | POA: Diagnosis not present

## 2012-05-24 ENCOUNTER — Other Ambulatory Visit (HOSPITAL_COMMUNITY): Payer: Self-pay | Admitting: Cardiovascular Disease

## 2012-05-24 DIAGNOSIS — I2510 Atherosclerotic heart disease of native coronary artery without angina pectoris: Secondary | ICD-10-CM

## 2012-06-06 ENCOUNTER — Ambulatory Visit (HOSPITAL_COMMUNITY)
Admission: RE | Admit: 2012-06-06 | Discharge: 2012-06-06 | Disposition: A | Discharge status: Home / Self Care | Payer: Medicare Other | Source: Ambulatory Visit | Attending: Cardiovascular Disease | Admitting: Cardiovascular Disease

## 2012-06-06 DIAGNOSIS — R0609 Other forms of dyspnea: Secondary | ICD-10-CM | POA: Insufficient documentation

## 2012-06-06 DIAGNOSIS — E119 Type 2 diabetes mellitus without complications: Secondary | ICD-10-CM | POA: Insufficient documentation

## 2012-06-06 DIAGNOSIS — I10 Essential (primary) hypertension: Secondary | ICD-10-CM | POA: Insufficient documentation

## 2012-06-06 DIAGNOSIS — I2510 Atherosclerotic heart disease of native coronary artery without angina pectoris: Secondary | ICD-10-CM | POA: Diagnosis not present

## 2012-06-06 DIAGNOSIS — Z9289 Personal history of other medical treatment: Secondary | ICD-10-CM

## 2012-06-06 DIAGNOSIS — R42 Dizziness and giddiness: Secondary | ICD-10-CM | POA: Insufficient documentation

## 2012-06-06 DIAGNOSIS — R0989 Other specified symptoms and signs involving the circulatory and respiratory systems: Secondary | ICD-10-CM | POA: Insufficient documentation

## 2012-06-06 HISTORY — DX: Personal history of other medical treatment: Z92.89

## 2012-06-06 MED ORDER — REGADENOSON 0.4 MG/5ML IV SOLN
0.4000 mg | Freq: Once | INTRAVENOUS | Status: AC
  Administered 2012-06-06: 0.4 mg via INTRAVENOUS

## 2012-06-06 MED ORDER — TECHNETIUM TC 99M SESTAMIBI GENERIC - CARDIOLITE
10.5000 | Freq: Once | INTRAVENOUS | Status: AC | PRN
  Administered 2012-06-06: 11 via INTRAVENOUS

## 2012-06-06 MED ORDER — TECHNETIUM TC 99M SESTAMIBI GENERIC - CARDIOLITE
32.0000 | Freq: Once | INTRAVENOUS | Status: AC | PRN
  Administered 2012-06-06: 32 via INTRAVENOUS

## 2012-06-06 NOTE — Procedures (Addendum)
[facility] [address] [place] [address] [id number]  Cardiology Nuclear Med Study  [name] is a 79 y.o. male     MRN : [id number]     DOB: [birth year]  Procedure Date: 06/06/2012  Nuclear Med Background Indication for Stress Test:  Evaluation for Ischemia History:  CAD Cardiac Risk Factors: Hypertension, IDDM Type 2, Lipids and Obesity  Symptoms:  Dizziness and DOE   Nuclear Pre-Procedure Caffeine/Decaff Intake:  10:00pm NPO After: 8:00am   IV Site: R Antecubital  IV 0.9% NS with Angio Cath:  22g  Chest Size (in):  48 IV Started by: Otho Perl, CNMT  Height: 5\' 7"  (1.702 m)  Cup Size: n/a  BMI:  Body mass index is 39.00 kg/(m^2). Weight:  249 lb (112.946 kg)   Tech Comments:  n/a    Nuclear Med Study 1 or 2 day study: 1 day  Stress Test Type:  [place] Provider:  Terance Ice, MD   Resting Radionuclide: Technetium 49m Sestamibi  Resting Radionuclide Dose: 10.5 mCi   Stress Radionuclide:  Technetium 74m Sestamibi  Stress Radionuclide Dose: 32.0 mCi           Stress Protocol Rest HR: 68 Stress HR: 82  Rest BP: 120/86 Stress BP: 157/99  Exercise Time (min): n/a METS: n/a          Dose of Adenosine (mg):  n/a Dose of Lexiscan: 0.4 mg  Dose of Atropine (mg): n/a Dose of Dobutamine: n/a mcg/kg/min (at max HR)  Stress Test Technologist: Mellody Memos, CCT Nuclear Technologist: Imagene Riches, CNMT   Rest Procedure:  Myocardial perfusion imaging was performed at rest 45 minutes following the intravenous administration of Technetium 35m Sestamibi. Stress Procedure:  The patient received IV Lexiscan 0.4 mg over 15-seconds.  Technetium 71m Sestamibi injected at 30-seconds.  There were no significant changes with Lexiscan.  Quantitative spect images were obtained after a 45 minute delay.  Transient Ischemic Dilatation (Normal <1.22):  1.29 Lung/Heart Ratio (Normal <0.45): 0.28 QGS  EDV:  93 ml QGS ESV:  63 ml LV Ejection Fraction: 34%  Signed by: Lesleigh Noe Phillips, CNMT  PHYSICIAN INTERPRETATION:  Rest ECG: NSR-LBBB and intermittent A pace V Sensing as well as A-V pacing.No acute changes  Stress ECG: No significant change from baseline ECG  QPS Raw Data Images:  does not likely alter ability to read. Stress Images:  There is decreased uptake in the mid to apical inferior wall. There is decreased uptake in the apex / inferoapical wall. There is decreased uptake in the basal inferoseptal wall septum - likely attributable to LBBB.  There is also a very focal area of decreased uptake in the apex. Rest Images:  Comparison with the stress images reveals no significant change.  The Actual apical defect is less pronounced, but the distal to apical inferior defect is more pronounced in rest than stress images. Subtraction (SDS):  There is a fixed defect that is most consistent with a previous infarction.No reversibility is appreciated.   Impression Exercise Capacity:  Lexiscan with no exercise. BP Response:  Normal blood pressure response. Clinical Symptoms:  No significant symptoms noted. ECG Impression:  No significant ST segment change suggestive of ischemia. LV Wall Motion:  Likely underestimated LV Function with septal WMA consistent with LBBB.  EF is more likely ~40-45% - LBBB makes wall motion difficult to assess.  Comparison with Prior Nuclear Study: No significant change from previous study -- inferior  defect was present, but attributed to diaphragmatic attenuation.  Overall Impression:  Low risk stress nuclear study. Fixed inferior defect consistent with prior infarction.  with moderately reduced EF.  No appreciable evidence of ischemia.   Leonie Man, MD  06/06/2012 3:03 PM

## 2012-06-13 DIAGNOSIS — Z8546 Personal history of malignant neoplasm of prostate: Secondary | ICD-10-CM | POA: Diagnosis not present

## 2012-06-13 DIAGNOSIS — N3020 Other chronic cystitis without hematuria: Secondary | ICD-10-CM | POA: Diagnosis not present

## 2012-07-19 ENCOUNTER — Other Ambulatory Visit (HOSPITAL_COMMUNITY): Payer: Self-pay | Admitting: Cardiovascular Disease

## 2012-07-19 DIAGNOSIS — I447 Left bundle-branch block, unspecified: Secondary | ICD-10-CM | POA: Diagnosis not present

## 2012-07-19 DIAGNOSIS — E782 Mixed hyperlipidemia: Secondary | ICD-10-CM | POA: Diagnosis not present

## 2012-07-19 DIAGNOSIS — I714 Abdominal aortic aneurysm, without rupture: Secondary | ICD-10-CM

## 2012-07-19 DIAGNOSIS — R011 Cardiac murmur, unspecified: Secondary | ICD-10-CM

## 2012-07-19 DIAGNOSIS — I2510 Atherosclerotic heart disease of native coronary artery without angina pectoris: Secondary | ICD-10-CM | POA: Diagnosis not present

## 2012-07-19 DIAGNOSIS — I719 Aortic aneurysm of unspecified site, without rupture: Secondary | ICD-10-CM | POA: Diagnosis not present

## 2012-07-19 DIAGNOSIS — Z950 Presence of cardiac pacemaker: Secondary | ICD-10-CM

## 2012-07-23 DIAGNOSIS — B351 Tinea unguium: Secondary | ICD-10-CM | POA: Diagnosis not present

## 2012-07-23 DIAGNOSIS — E1049 Type 1 diabetes mellitus with other diabetic neurological complication: Secondary | ICD-10-CM | POA: Diagnosis not present

## 2012-07-23 DIAGNOSIS — M79609 Pain in unspecified limb: Secondary | ICD-10-CM | POA: Diagnosis not present

## 2012-08-05 DIAGNOSIS — N183 Chronic kidney disease, stage 3 unspecified: Secondary | ICD-10-CM | POA: Diagnosis not present

## 2012-08-05 DIAGNOSIS — E559 Vitamin D deficiency, unspecified: Secondary | ICD-10-CM | POA: Diagnosis not present

## 2012-08-05 DIAGNOSIS — I10 Essential (primary) hypertension: Secondary | ICD-10-CM | POA: Diagnosis not present

## 2012-08-05 DIAGNOSIS — E782 Mixed hyperlipidemia: Secondary | ICD-10-CM | POA: Diagnosis not present

## 2012-08-05 DIAGNOSIS — E1129 Type 2 diabetes mellitus with other diabetic kidney complication: Secondary | ICD-10-CM | POA: Diagnosis not present

## 2012-08-09 ENCOUNTER — Ambulatory Visit (HOSPITAL_COMMUNITY): Payer: Medicare Other

## 2012-08-09 ENCOUNTER — Encounter (HOSPITAL_COMMUNITY): Payer: Medicare Other

## 2012-08-09 DIAGNOSIS — H18419 Arcus senilis, unspecified eye: Secondary | ICD-10-CM | POA: Diagnosis not present

## 2012-08-09 DIAGNOSIS — H2510 Age-related nuclear cataract, unspecified eye: Secondary | ICD-10-CM | POA: Diagnosis not present

## 2012-08-15 ENCOUNTER — Ambulatory Visit (HOSPITAL_COMMUNITY)
Admission: RE | Admit: 2012-08-15 | Discharge: 2012-08-15 | Disposition: A | Discharge status: Home / Self Care | Payer: Medicare Other | Source: Ambulatory Visit | Attending: Cardiovascular Disease | Admitting: Cardiovascular Disease

## 2012-08-15 DIAGNOSIS — Z951 Presence of aortocoronary bypass graft: Secondary | ICD-10-CM | POA: Insufficient documentation

## 2012-08-15 DIAGNOSIS — Z950 Presence of cardiac pacemaker: Secondary | ICD-10-CM

## 2012-08-15 DIAGNOSIS — I447 Left bundle-branch block, unspecified: Secondary | ICD-10-CM | POA: Diagnosis not present

## 2012-08-15 DIAGNOSIS — I714 Abdominal aortic aneurysm, without rupture, unspecified: Secondary | ICD-10-CM | POA: Insufficient documentation

## 2012-08-15 DIAGNOSIS — R011 Cardiac murmur, unspecified: Secondary | ICD-10-CM | POA: Diagnosis not present

## 2012-08-15 DIAGNOSIS — I2510 Atherosclerotic heart disease of native coronary artery without angina pectoris: Secondary | ICD-10-CM | POA: Diagnosis not present

## 2012-08-15 DIAGNOSIS — Z9289 Personal history of other medical treatment: Secondary | ICD-10-CM

## 2012-08-15 DIAGNOSIS — R0989 Other specified symptoms and signs involving the circulatory and respiratory systems: Secondary | ICD-10-CM | POA: Diagnosis not present

## 2012-08-15 HISTORY — DX: Personal history of other medical treatment: Z92.89

## 2012-08-15 NOTE — Progress Notes (Signed)
Aorta Duplex Completed. [name]  

## 2012-08-15 NOTE — Progress Notes (Signed)
[facility]   2D echo completed 08/15/2012.   Jamison Neighbor, RDCS

## 2012-10-03 DIAGNOSIS — H1045 Other chronic allergic conjunctivitis: Secondary | ICD-10-CM | POA: Diagnosis not present

## 2012-10-12 DIAGNOSIS — I498 Other specified cardiac arrhythmias: Secondary | ICD-10-CM | POA: Diagnosis not present

## 2012-10-12 LAB — PACEMAKER DEVICE OBSERVATION

## 2012-10-15 DIAGNOSIS — B351 Tinea unguium: Secondary | ICD-10-CM | POA: Diagnosis not present

## 2012-10-15 DIAGNOSIS — M79609 Pain in unspecified limb: Secondary | ICD-10-CM | POA: Diagnosis not present

## 2012-10-17 ENCOUNTER — Other Ambulatory Visit: Payer: Self-pay

## 2012-10-17 MED ORDER — FUROSEMIDE 20 MG PO TABS: 20.0000 mg | ORAL_TABLET | Freq: Every day | ORAL | Status: DC

## 2012-10-17 NOTE — Telephone Encounter (Signed)
Rx faxed to pharmacy  

## 2012-10-28 DIAGNOSIS — H2510 Age-related nuclear cataract, unspecified eye: Secondary | ICD-10-CM | POA: Diagnosis not present

## 2012-10-28 DIAGNOSIS — H269 Unspecified cataract: Secondary | ICD-10-CM | POA: Diagnosis not present

## 2012-10-29 DIAGNOSIS — H2510 Age-related nuclear cataract, unspecified eye: Secondary | ICD-10-CM | POA: Diagnosis not present

## 2012-11-06 DIAGNOSIS — H9190 Unspecified hearing loss, unspecified ear: Secondary | ICD-10-CM | POA: Diagnosis not present

## 2012-11-06 DIAGNOSIS — H9319 Tinnitus, unspecified ear: Secondary | ICD-10-CM | POA: Diagnosis not present

## 2012-11-11 DIAGNOSIS — H2510 Age-related nuclear cataract, unspecified eye: Secondary | ICD-10-CM | POA: Diagnosis not present

## 2012-11-11 DIAGNOSIS — H269 Unspecified cataract: Secondary | ICD-10-CM | POA: Diagnosis not present

## 2012-11-14 ENCOUNTER — Encounter: Payer: Self-pay | Admitting: *Deleted

## 2012-11-29 ENCOUNTER — Encounter: Payer: Self-pay | Admitting: Cardiovascular Disease

## 2012-11-29 ENCOUNTER — Other Ambulatory Visit: Payer: Self-pay | Admitting: *Deleted

## 2012-11-29 DIAGNOSIS — E782 Mixed hyperlipidemia: Secondary | ICD-10-CM | POA: Diagnosis not present

## 2012-11-29 DIAGNOSIS — I2510 Atherosclerotic heart disease of native coronary artery without angina pectoris: Secondary | ICD-10-CM | POA: Diagnosis not present

## 2012-11-29 DIAGNOSIS — R0989 Other specified symptoms and signs involving the circulatory and respiratory systems: Secondary | ICD-10-CM

## 2012-11-29 DIAGNOSIS — I714 Abdominal aortic aneurysm, without rupture: Secondary | ICD-10-CM

## 2012-11-29 LAB — PACEMAKER DEVICE OBSERVATION

## 2012-12-02 ENCOUNTER — Telehealth: Payer: Self-pay | Admitting: Cardiovascular Disease

## 2012-12-02 NOTE — Telephone Encounter (Signed)
Per Hebert Soho, MA complete.

## 2012-12-02 NOTE — Telephone Encounter (Signed)
Please call-question about prescription that was just sent on his Metoprolol!

## 2012-12-07 ENCOUNTER — Encounter: Payer: Self-pay | Admitting: Cardiovascular Disease

## 2012-12-16 ENCOUNTER — Ambulatory Visit (HOSPITAL_COMMUNITY)
Admission: RE | Admit: 2012-12-16 | Discharge: 2012-12-16 | Disposition: A | Discharge status: Home / Self Care | Payer: Medicare Other | Source: Ambulatory Visit | Attending: Cardiovascular Disease | Admitting: Cardiovascular Disease

## 2012-12-16 DIAGNOSIS — I672 Cerebral atherosclerosis: Secondary | ICD-10-CM | POA: Diagnosis not present

## 2012-12-16 DIAGNOSIS — R0989 Other specified symptoms and signs involving the circulatory and respiratory systems: Secondary | ICD-10-CM | POA: Insufficient documentation

## 2012-12-16 DIAGNOSIS — R9389 Abnormal findings on diagnostic imaging of other specified body structures: Secondary | ICD-10-CM

## 2012-12-16 HISTORY — DX: Abnormal findings on diagnostic imaging of other specified body structures: R93.89

## 2012-12-16 NOTE — Progress Notes (Signed)
Carotid Duplex Completed. Ligaya Cormier, RDMS, RVT  

## 2012-12-24 ENCOUNTER — Ambulatory Visit (HOSPITAL_COMMUNITY)
Admission: RE | Admit: 2012-12-24 | Discharge: 2012-12-24 | Disposition: A | Discharge status: Home / Self Care | Payer: Medicare Other | Source: Ambulatory Visit | Attending: Cardiovascular Disease | Admitting: Cardiovascular Disease

## 2012-12-24 DIAGNOSIS — I714 Abdominal aortic aneurysm, without rupture: Secondary | ICD-10-CM

## 2013-01-07 DIAGNOSIS — B351 Tinea unguium: Secondary | ICD-10-CM | POA: Diagnosis not present

## 2013-01-07 DIAGNOSIS — E1049 Type 1 diabetes mellitus with other diabetic neurological complication: Secondary | ICD-10-CM | POA: Diagnosis not present

## 2013-01-07 DIAGNOSIS — M79609 Pain in unspecified limb: Secondary | ICD-10-CM | POA: Diagnosis not present

## 2013-01-14 ENCOUNTER — Telehealth: Payer: Self-pay | Admitting: Cardiovascular Disease

## 2013-01-14 NOTE — Telephone Encounter (Signed)
Returned call.  Pt informed he has a Pacemaker, not a Defibrillator.  Pt verbalized understanding.  Stated he received a letter from [facility] about attending a shock conference and he didn't think he has anything like that.   Message forwarded to S. Tye Savoy, CMA r/t device/monitor concerns (FYI).

## 2013-01-14 NOTE — Telephone Encounter (Signed)
Wants to know if his pacemaker is an icd?

## 2013-01-29 DIAGNOSIS — IMO0001 Reserved for inherently not codable concepts without codable children: Secondary | ICD-10-CM | POA: Diagnosis not present

## 2013-02-27 DIAGNOSIS — E782 Mixed hyperlipidemia: Secondary | ICD-10-CM | POA: Diagnosis not present

## 2013-02-27 DIAGNOSIS — I10 Essential (primary) hypertension: Secondary | ICD-10-CM | POA: Diagnosis not present

## 2013-02-27 DIAGNOSIS — N183 Chronic kidney disease, stage 3 unspecified: Secondary | ICD-10-CM | POA: Diagnosis not present

## 2013-02-27 DIAGNOSIS — Z8546 Personal history of malignant neoplasm of prostate: Secondary | ICD-10-CM | POA: Diagnosis not present

## 2013-02-27 DIAGNOSIS — Z0000 Encounter for general adult medical examination without abnormal findings: Secondary | ICD-10-CM | POA: Diagnosis not present

## 2013-02-27 DIAGNOSIS — Z79899 Other long term (current) drug therapy: Secondary | ICD-10-CM | POA: Diagnosis not present

## 2013-02-27 DIAGNOSIS — E1129 Type 2 diabetes mellitus with other diabetic kidney complication: Secondary | ICD-10-CM | POA: Diagnosis not present

## 2013-03-03 DIAGNOSIS — Z23 Encounter for immunization: Secondary | ICD-10-CM | POA: Diagnosis not present

## 2013-03-03 DIAGNOSIS — Z8546 Personal history of malignant neoplasm of prostate: Secondary | ICD-10-CM | POA: Diagnosis not present

## 2013-03-03 DIAGNOSIS — Z79899 Other long term (current) drug therapy: Secondary | ICD-10-CM | POA: Diagnosis not present

## 2013-03-03 DIAGNOSIS — E1129 Type 2 diabetes mellitus with other diabetic kidney complication: Secondary | ICD-10-CM | POA: Diagnosis not present

## 2013-03-03 DIAGNOSIS — E782 Mixed hyperlipidemia: Secondary | ICD-10-CM | POA: Diagnosis not present

## 2013-03-04 ENCOUNTER — Encounter: Payer: Self-pay | Admitting: Cardiovascular Disease

## 2013-03-13 ENCOUNTER — Telehealth: Payer: Self-pay | Admitting: Cardiovascular Disease

## 2013-03-13 NOTE — Telephone Encounter (Signed)
Need refill on his Proton ix 40 mg #30.

## 2013-03-13 NOTE — Telephone Encounter (Signed)
Returned call.  Left message asking that hardy copy request be faxed to [phone] for refills.

## 2013-03-18 ENCOUNTER — Telehealth: Payer: Self-pay | Admitting: Cardiovascular Disease

## 2013-03-18 NOTE — Telephone Encounter (Signed)
No refill request received.  Call to pharmacy and left message to fax hard copy refill request to [phone].  Returned call and pt verified x 2.  Pt informed message received and pharmacy contacted to fax hard copy refill requests for all refills to office.  Informed will refill when received.  Pt verbalized understanding and agreed w/ plan.

## 2013-03-18 NOTE — Telephone Encounter (Signed)
Calling regarding refill Protonix generic  His pharmacy ([facility]) requested refills last week and yesterday sent again marked stat  He almost out   Need to get refill sooon,  Please call

## 2013-03-19 DIAGNOSIS — R599 Enlarged lymph nodes, unspecified: Secondary | ICD-10-CM | POA: Diagnosis not present

## 2013-03-19 DIAGNOSIS — J029 Acute pharyngitis, unspecified: Secondary | ICD-10-CM | POA: Diagnosis not present

## 2013-03-20 ENCOUNTER — Encounter: Payer: Self-pay | Admitting: Cardiovascular Disease

## 2013-03-20 ENCOUNTER — Other Ambulatory Visit: Payer: Self-pay | Admitting: *Deleted

## 2013-03-20 MED ORDER — ROSUVASTATIN CALCIUM 40 MG PO TABS: 40.0000 mg | ORAL_TABLET | Freq: Every day | ORAL | Status: DC

## 2013-03-21 MED ORDER — ROSUVASTATIN CALCIUM 40 MG PO TABS: 40.0000 mg | ORAL_TABLET | Freq: Every day | ORAL | Status: DC

## 2013-03-22 ENCOUNTER — Emergency Department (HOSPITAL_COMMUNITY)
Admission: EM | Admit: 2013-03-22 | Discharge: 2013-03-22 | Disposition: A | Discharge status: Home / Self Care | Payer: Medicare Other | Attending: Emergency Medicine | Admitting: Emergency Medicine

## 2013-03-22 ENCOUNTER — Encounter (HOSPITAL_COMMUNITY): Payer: Self-pay | Admitting: Emergency Medicine

## 2013-03-22 DIAGNOSIS — Z87440 Personal history of urinary (tract) infections: Secondary | ICD-10-CM | POA: Diagnosis not present

## 2013-03-22 DIAGNOSIS — E119 Type 2 diabetes mellitus without complications: Secondary | ICD-10-CM | POA: Diagnosis not present

## 2013-03-22 DIAGNOSIS — Z950 Presence of cardiac pacemaker: Secondary | ICD-10-CM | POA: Diagnosis not present

## 2013-03-22 DIAGNOSIS — Z8669 Personal history of other diseases of the nervous system and sense organs: Secondary | ICD-10-CM | POA: Insufficient documentation

## 2013-03-22 DIAGNOSIS — Z7982 Long term (current) use of aspirin: Secondary | ICD-10-CM | POA: Diagnosis not present

## 2013-03-22 DIAGNOSIS — K146 Glossodynia: Secondary | ICD-10-CM | POA: Insufficient documentation

## 2013-03-22 DIAGNOSIS — Z794 Long term (current) use of insulin: Secondary | ICD-10-CM | POA: Insufficient documentation

## 2013-03-22 DIAGNOSIS — Z859 Personal history of malignant neoplasm, unspecified: Secondary | ICD-10-CM | POA: Insufficient documentation

## 2013-03-22 DIAGNOSIS — Z79899 Other long term (current) drug therapy: Secondary | ICD-10-CM | POA: Diagnosis not present

## 2013-03-22 DIAGNOSIS — Z8619 Personal history of other infectious and parasitic diseases: Secondary | ICD-10-CM | POA: Insufficient documentation

## 2013-03-22 HISTORY — DX: Urinary tract infection, site not specified: N39.0

## 2013-03-22 HISTORY — DX: Presence of cardiac pacemaker: Z95.0

## 2013-03-22 HISTORY — DX: Type 2 diabetes mellitus without complications: E11.9

## 2013-03-22 HISTORY — DX: Gastrointestinal hemorrhage, unspecified: K92.2

## 2013-03-22 HISTORY — DX: Zoster without complications: B02.9

## 2013-03-22 HISTORY — DX: Unspecified blepharitis unspecified eye, unspecified eyelid: H01.009

## 2013-03-22 HISTORY — DX: Malignant (primary) neoplasm, unspecified: C80.1

## 2013-03-22 HISTORY — DX: Gastric ulcer, unspecified as acute or chronic, without hemorrhage or perforation: K25.9

## 2013-03-22 LAB — GLUCOSE, CAPILLARY: Glucose-Capillary: 106 mg/dL — ABNORMAL HIGH (ref 70–99)

## 2013-03-22 MED ORDER — CLINDAMYCIN HCL 150 MG PO CAPS: 300.0000 mg | ORAL_CAPSULE | Freq: Three times a day (TID) | ORAL | Status: DC

## 2013-03-22 NOTE — ED Provider Notes (Signed)
CSN: [id number]     Arrival date & time 03/22/13  1956 History  This chart was scribed for non-physician practitioner Antonietta Breach, PA-C, working with Virgel Manifold, MD by Zettie Pho, ED Scribe. This patient was seen in room WTR8/WTR8 and the patient's care was started at 8:30 PM.    Chief Complaint  Patient presents with  . Oral Swelling   The history is provided by the patient. No language interpreter was used.   HPI Comments: [name] is a 79 y.o. male who presents to the Emergency Department complaining of a constant tongue pain onset 3 days ago that has been progressively worsening. Patient reports that he was seen by his PCP 3 days ago for tenderness to his right cervical lymph nodes. Patient was diagnosed with tonsillitis and discharged with amoxicillin, which he states he is still currently taking. He states that the lymphadenopathy has been improving, but that no there is pain and swelling to his tongue. He reports associated difficulty speaking secondary to the pain and swelling. Patient reports a history of DM.  Past Medical History  Diagnosis Date  . Pacemaker   . Diabetes mellitus without complication   . Cancer   . Shingles   . GI bleed   . Gastric ulcer   . UTI (lower urinary tract infection)   . Blepharitis    Past Surgical History  Procedure Laterality Date  . Knee surgery Right   . Spine surgery    . Prostate biopsy    . Prostate seed implants    . Cardiac stents    . Wisdom tooth extraction    . Cataract extraction     No family history on file. History  Substance Use Topics  . Smoking status: Never Smoker   . Smokeless tobacco: Not on file  . Alcohol Use: Yes     Comment: daily wine    Review of Systems  HENT: Positive for voice change.   All other systems reviewed and are negative.    Allergies  Review of patient's allergies indicates no known allergies.  Home Medications   Current Outpatient Rx  Name  Route  Sig  Dispense  Refill  .  amoxicillin (AMOXIL) 875 MG tablet   Oral   Take 875 mg by mouth 2 (two) times daily.         Marland Kitchen aspirin 81 MG tablet   Oral   Take 81 mg by mouth daily.         . cholecalciferol (VITAMIN D) 1000 UNITS tablet   Oral   Take 2,000 Units by mouth daily.         . clopidogrel (PLAVIX) 75 MG tablet   Oral   Take 75 mg by mouth daily.         . Coenzyme Q10 (COQ-10) 150 MG CAPS   Oral   Take 1 capsule by mouth daily.          . furosemide (LASIX) 20 MG tablet   Oral   Take 1 tablet (20 mg total) by mouth daily. ALTERNATING with 2 tablets as directed.   30 tablet   9   . insulin lispro protamine-lispro (HUMALOG 75/25) (75-25) 100 UNIT/ML SUSP injection   Subcutaneous   Inject 30-35 Units into the skin 2 (two) times daily with a meal. Sliding scale         . losartan (COZAAR) 100 MG tablet   Oral   Take 150 mg by mouth daily.         Marland Kitchen  metoprolol (LOPRESSOR) 50 MG tablet   Oral   Take 75 mg by mouth daily. Take 1 1/2 tablets daily         . nitroGLYCERIN (NITROSTAT) 0.4 MG SL tablet   Sublingual   Place 0.4 mg under the tongue every 5 (five) minutes as needed for chest pain.         . Omega-3 Fatty Acids (FISH OIL) 1000 MG CAPS   Oral   Take 2,000 mg by mouth daily.         . potassium chloride (K-DUR) 10 MEQ tablet   Oral   Take 10-20 mEq by mouth daily. Takes 29mEq one day and 19mEq the next (alternates)         . rosuvastatin (CRESTOR) 40 MG tablet   Oral   Take 1 tablet (40 mg total) by mouth daily.   90 tablet   3   . vitamin C (ASCORBIC ACID) 500 MG tablet   Oral   Take 500 mg by mouth daily.         . vitamin E 400 UNIT capsule   Oral   Take 400 Units by mouth daily.         . clindamycin (CLEOCIN) 150 MG capsule   Oral   Take 2 capsules (300 mg total) by mouth 3 (three) times daily. May dispense as 150mg  capsules   60 capsule   0    Triage Vitals: BP 146/71  Pulse 100  Temp(Src) 97.4 F (36.3 C) (Oral)  Resp 18  Ht  5\' 7"  (1.702 m)  Wt 250 lb (113.399 kg)  BMI 39.15 kg/m2  SpO2 97%  Physical Exam  Nursing note and vitals reviewed. Constitutional: He is oriented to person, place, and time. He appears well-developed and well-nourished. No distress.  HENT:  Head: Normocephalic and atraumatic.  Mouth/Throat: Uvula is midline. Posterior oropharyngeal erythema ( mild) present. No oropharyngeal exudate.    Patient with geographic tongue on R side with right sided sublingual swelling with mild TTP. No obvious ulceration. Airway patent and uvula midline. Patient tolerating secretions without difficulty or drooling.  Eyes: Conjunctivae and EOM are normal. Pupils are equal, round, and reactive to light. No scleral icterus.  Neck: Normal range of motion. Neck supple.  Pulmonary/Chest: Effort normal. No stridor. No respiratory distress.  Musculoskeletal: Normal range of motion.  Lymphadenopathy:    He has no cervical adenopathy.  Neurological: He is alert and oriented to person, place, and time.  Skin: Skin is warm and dry. No rash noted. He is not diaphoretic. No erythema. No pallor.  Psychiatric: He has a normal mood and affect. His behavior is normal.    ED Course  Procedures (including critical care time)  DIAGNOSTIC STUDIES: Oxygen Saturation is 97% on room air, normal by my interpretation.    COORDINATION OF CARE: 8:40 PM- Will consult with Dr. Wilson Singer, who will come and evaluate the patient. Discussed treatment plan with patient at bedside and patient verbalized agreement.   9:11 PM- Will switch antibiotic to Clindamycin and discharge patient with pain medication to manage symptoms. Discussed treatment plan with patient at bedside and patient verbalized agreement.   Labs Review Labs Reviewed  GLUCOSE, CAPILLARY - Abnormal; Notable for the following:    Glucose-Capillary 106 (*)    All other components within normal limits   Imaging Review No results found.  EKG Interpretation   None        MDM   1. Tongue pain  Patient presents for worsening R tongue pain and swelling. Patient well and nontoxic appearing, hemodynamically stable, and afebrile. Patient recently began a course of amoxicillin for suspected tonsillitis. Patient without angioedema and airways patent; patient tolerating secretions without difficulty. Patient with mild evidence of geographic tongue on right-sided. Physical exam also significant for right sided sublingual swelling with tenderness to palpation. Findings consistent with blocked Wharton's duct. Plan to change antibiotic from amoxicillin to clindamycin and to advise patient to promote salivation such as by sucking on tart candies. Patient appropriate for discharge with primary care followup as needed. Return precautions discussed and patient agreeable to plan with no unaddressed concerns. Patient seen also by Dr. Wilson Singer who is in agreement with this workup, assessment, management plan, and patient's stability for discharge.  I personally performed the services described in this documentation, which was scribed in my presence. The recorded information has been reviewed and is accurate.      Antonietta Breach, PA-C 03/23/13 2133

## 2013-03-22 NOTE — ED Notes (Signed)
Pt c/o R sided tongue swelling and pain. Pt is on 3rd day of Amoxicillin for ?Tonsilitis. Pt c/o difficulty chewing and speaking d/t pain and swelling. Ulceration noted to tongue

## 2013-03-22 NOTE — ED Notes (Signed)
Dr. Kohut at bedside 

## 2013-03-22 NOTE — ED Notes (Signed)
Patient c/o tongue swelling. Patient states he is on amoxicillin at home. Patient states he took his insulin tonight before dinner, patient decided "suddenly to come here" before eating his dinner. States he feels "shakey" CBG 106.

## 2013-03-24 DIAGNOSIS — N183 Chronic kidney disease, stage 3 unspecified: Secondary | ICD-10-CM | POA: Diagnosis not present

## 2013-03-24 DIAGNOSIS — I2510 Atherosclerotic heart disease of native coronary artery without angina pectoris: Secondary | ICD-10-CM | POA: Diagnosis not present

## 2013-03-24 DIAGNOSIS — E1129 Type 2 diabetes mellitus with other diabetic kidney complication: Secondary | ICD-10-CM | POA: Diagnosis not present

## 2013-03-24 DIAGNOSIS — K3184 Gastroparesis: Secondary | ICD-10-CM | POA: Diagnosis not present

## 2013-03-24 DIAGNOSIS — D649 Anemia, unspecified: Secondary | ICD-10-CM | POA: Diagnosis not present

## 2013-03-24 DIAGNOSIS — D500 Iron deficiency anemia secondary to blood loss (chronic): Secondary | ICD-10-CM | POA: Diagnosis not present

## 2013-03-24 DIAGNOSIS — K1120 Sialoadenitis, unspecified: Secondary | ICD-10-CM | POA: Diagnosis not present

## 2013-03-24 DIAGNOSIS — E1165 Type 2 diabetes mellitus with hyperglycemia: Secondary | ICD-10-CM | POA: Diagnosis not present

## 2013-03-27 NOTE — ED Provider Notes (Signed)
Medical screening examination/treatment/procedure(s) were conducted as a shared visit with non-physician practitioner(s) and myself.  I personally evaluated the patient during the encounter.  EKG Interpretation   None      81yM with R sialadenitis. No airway compromise. Sialogogues, abx, pain meds, ENT FU.    Virgel Manifold, MD 03/27/13 [phone]

## 2013-04-01 DIAGNOSIS — B351 Tinea unguium: Secondary | ICD-10-CM | POA: Diagnosis not present

## 2013-04-01 DIAGNOSIS — M79609 Pain in unspecified limb: Secondary | ICD-10-CM | POA: Diagnosis not present

## 2013-04-07 ENCOUNTER — Encounter: Payer: Self-pay | Admitting: Cardiovascular Disease

## 2013-04-09 ENCOUNTER — Encounter: Payer: Self-pay | Admitting: Cardiovascular Disease

## 2013-04-10 ENCOUNTER — Encounter: Payer: Self-pay | Admitting: Cardiovascular Disease

## 2013-04-12 LAB — PACEMAKER DEVICE OBSERVATION

## 2013-04-14 ENCOUNTER — Ambulatory Visit: Payer: Medicare Other

## 2013-04-25 ENCOUNTER — Encounter: Payer: Self-pay | Admitting: *Deleted

## 2013-04-25 LAB — MDC_IDC_ENUM_SESS_TYPE_REMOTE
Battery Remaining Longevity: 7
Battery Voltage: 2.95 V
Brady Statistic RA Percent Paced: 48 %
Brady Statistic RV Percent Paced: 85 %
Implantable Pulse Generator Model: 2210
Implantable Pulse Generator Serial Number: 7174903
Lead Channel Impedance Value: 310 Ohm
Lead Channel Impedance Value: 540 Ohm
Lead Channel Pacing Threshold Amplitude: 0.875 V
Lead Channel Pacing Threshold Amplitude: 1.125 V
Lead Channel Pacing Threshold Pulse Width: 0.4 ms
Lead Channel Pacing Threshold Pulse Width: 0.6 ms
Lead Channel Sensing Intrinsic Amplitude: 5 mV
Lead Channel Sensing Intrinsic Amplitude: 9.1 mV
Lead Channel Setting Pacing Amplitude: 1.375
Lead Channel Setting Pacing Amplitude: 1.625
Lead Channel Setting Pacing Pulse Width: 0.6 ms
Lead Channel Setting Sensing Sensitivity: 4 mV

## 2013-05-08 DIAGNOSIS — B3749 Other urogenital candidiasis: Secondary | ICD-10-CM | POA: Diagnosis not present

## 2013-05-08 DIAGNOSIS — N390 Urinary tract infection, site not specified: Secondary | ICD-10-CM | POA: Diagnosis not present

## 2013-06-04 DIAGNOSIS — E1149 Type 2 diabetes mellitus with other diabetic neurological complication: Secondary | ICD-10-CM | POA: Diagnosis not present

## 2013-06-04 DIAGNOSIS — R946 Abnormal results of thyroid function studies: Secondary | ICD-10-CM | POA: Diagnosis not present

## 2013-06-04 DIAGNOSIS — K3184 Gastroparesis: Secondary | ICD-10-CM | POA: Diagnosis not present

## 2013-06-04 DIAGNOSIS — N183 Chronic kidney disease, stage 3 unspecified: Secondary | ICD-10-CM | POA: Diagnosis not present

## 2013-06-23 ENCOUNTER — Other Ambulatory Visit: Payer: Self-pay | Admitting: *Deleted

## 2013-06-24 DIAGNOSIS — B351 Tinea unguium: Secondary | ICD-10-CM | POA: Diagnosis not present

## 2013-06-24 DIAGNOSIS — M79609 Pain in unspecified limb: Secondary | ICD-10-CM | POA: Diagnosis not present

## 2013-06-24 DIAGNOSIS — N3020 Other chronic cystitis without hematuria: Secondary | ICD-10-CM | POA: Diagnosis not present

## 2013-06-24 DIAGNOSIS — Z8546 Personal history of malignant neoplasm of prostate: Secondary | ICD-10-CM | POA: Diagnosis not present

## 2013-06-30 ENCOUNTER — Telehealth: Payer: Self-pay | Admitting: *Deleted

## 2013-06-30 ENCOUNTER — Other Ambulatory Visit: Payer: Self-pay

## 2013-06-30 MED ORDER — LOSARTAN POTASSIUM 100 MG PO TABS: 100.0000 mg | ORAL_TABLET | Freq: Every day | ORAL | Status: DC

## 2013-06-30 MED ORDER — PANTOPRAZOLE SODIUM 40 MG PO TBEC: 40.0000 mg | DELAYED_RELEASE_TABLET | Freq: Every day | ORAL | Status: DC

## 2013-06-30 NOTE — Telephone Encounter (Signed)
Insurance will not cover > 30 Losartan tablets in a 30 day period.  Reviewed w/Dr. C - decrease Losartan to 100mg  qd.  Patient advised and will see if his BP is controlled.  IF not will change med.  Voiced understanding.

## 2013-06-30 NOTE — Telephone Encounter (Signed)
Received request from pharmacy for Pantoprazole (Protonix). Per paper chart, he was "off" of that medication.  Patient stated there was a period of time when he went off of the pantoprazole but has since gone back on said medication due to worsening reflux symptoms.  Rx was sent to pharmacy electronically.

## 2013-07-02 ENCOUNTER — Other Ambulatory Visit: Payer: Self-pay | Admitting: *Deleted

## 2013-07-02 MED ORDER — POTASSIUM CHLORIDE ER 10 MEQ PO TBCR: 10.0000 meq | EXTENDED_RELEASE_TABLET | Freq: Every day | ORAL | Status: DC

## 2013-07-02 NOTE — Telephone Encounter (Signed)
Rx was sent to pharmacy electronically. 

## 2013-07-24 ENCOUNTER — Encounter: Payer: Self-pay | Admitting: Cardiovascular Disease

## 2013-07-24 ENCOUNTER — Ambulatory Visit (INDEPENDENT_AMBULATORY_CARE_PROVIDER_SITE_OTHER): Payer: Medicare Other | Admitting: Cardiovascular Disease

## 2013-07-24 VITALS — BP 140/82 | HR 73 | Resp 16 | Ht 68.0 in | Wt 261.9 lb

## 2013-07-24 DIAGNOSIS — Z950 Presence of cardiac pacemaker: Secondary | ICD-10-CM | POA: Insufficient documentation

## 2013-07-24 DIAGNOSIS — G4733 Obstructive sleep apnea (adult) (pediatric): Secondary | ICD-10-CM | POA: Insufficient documentation

## 2013-07-24 DIAGNOSIS — I2510 Atherosclerotic heart disease of native coronary artery without angina pectoris: Secondary | ICD-10-CM

## 2013-07-24 DIAGNOSIS — I2589 Other forms of chronic ischemic heart disease: Secondary | ICD-10-CM

## 2013-07-24 DIAGNOSIS — N183 Chronic kidney disease, stage 3 unspecified: Secondary | ICD-10-CM

## 2013-07-24 DIAGNOSIS — I495 Sick sinus syndrome: Secondary | ICD-10-CM | POA: Diagnosis not present

## 2013-07-24 DIAGNOSIS — E1129 Type 2 diabetes mellitus with other diabetic kidney complication: Secondary | ICD-10-CM

## 2013-07-24 DIAGNOSIS — E782 Mixed hyperlipidemia: Secondary | ICD-10-CM | POA: Insufficient documentation

## 2013-07-24 DIAGNOSIS — E1122 Type 2 diabetes mellitus with diabetic chronic kidney disease: Secondary | ICD-10-CM

## 2013-07-24 DIAGNOSIS — Z9989 Dependence on other enabling machines and devices: Secondary | ICD-10-CM

## 2013-07-24 DIAGNOSIS — I255 Ischemic cardiomyopathy: Secondary | ICD-10-CM | POA: Insufficient documentation

## 2013-07-24 HISTORY — DX: Mixed hyperlipidemia: E78.2

## 2013-07-24 HISTORY — DX: Atherosclerotic heart disease of native coronary artery without angina pectoris: I25.10

## 2013-07-24 HISTORY — DX: Obstructive sleep apnea (adult) (pediatric): G47.33

## 2013-07-24 HISTORY — DX: Ischemic cardiomyopathy: I25.5

## 2013-07-24 HISTORY — DX: Chronic kidney disease, stage 3 unspecified: N18.30

## 2013-07-24 LAB — PACEMAKER DEVICE OBSERVATION

## 2013-07-24 NOTE — Progress Notes (Signed)
Patient ID: [name], male   DOB: 24-Apr-1932, 80 y.o.   MRN: [id number]      Reason for office visit Establish new pacemaker followup  This is a 80 year old gentleman, former long-term patient of Dr. Rollene Fare, who is now seeing Dr. Debara Pickett for the majority of his cardiac problems. He has a dual-chamber permanent pacemaker that was initially implanted in 1990 for symptomatic bradycardia, with subsequent generator change outs in 2000 07/01/2009. His initial leads are still operating with excellent parameters. He is enrolled in remote monitoring using the [employer] system.  He has no cardiac complaints today.  Device interrogation shows roughly 50% atrial pacing and almost 90% ventricular pacing which is comparable to his historical trends. Battery 2.96 V with an estimated longevity of 7-8 years. He has infrequent and very brief episodes of mode switch related to bursts of atrial tachycardia. 2 atrial fibrillation was not recorded since his last device check. At presentation today he is in sinus rhythm with native AV conduction. Some far field oversensing was dealt with by slightly increasing PVAB 230 ms. No other permanent changes to make the device settings.  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 aspirin 81 MG tablet Take 81 mg by mouth daily.      . cholecalciferol (VITAMIN D) 1000 UNITS tablet Take 2,000 Units by mouth daily.      . clopidogrel (PLAVIX) 75 MG tablet Take 75 mg by mouth daily.      . Coenzyme Q10 (COQ-10) 150 MG CAPS Take 1 capsule by mouth daily.       . furosemide (LASIX) 20 MG tablet Take 1 tablet (20 mg total) by mouth daily. ALTERNATING with 2 tablets as directed.  30 tablet  9  . insulin lispro protamine-lispro (HUMALOG 75/25) (75-25) 100 UNIT/ML SUSP injection Inject 30-35 Units into the skin 2  (two) times daily with a meal. Sliding scale      . losartan (COZAAR) 100 MG tablet Take 1 tablet (100 mg total) by mouth daily.  30 tablet  0  . metoprolol (LOPRESSOR) 50 MG tablet Take 75 mg by mouth daily. Take 1 1/2 tablets daily      . nitroGLYCERIN (NITROSTAT) 0.4 MG SL tablet Place 0.4 mg under the tongue every 5 (five) minutes as needed for chest pain.      . Omega-3 Fatty Acids (FISH OIL) 1000 MG CAPS Take 2,000 mg by mouth daily.      . pantoprazole (PROTONIX) 40 MG tablet Take 1 tablet (40 mg total) by mouth daily.  30 tablet  5  . potassium chloride (K-DUR) 10 MEQ tablet Take 1-2 tablets (10-20 mEq total) by mouth daily. Takes 13mEq one day and 69mEq the next (alternates)  60 tablet  6  . rosuvastatin (CRESTOR) 40 MG tablet Take 1 tablet (40 mg total) by mouth daily.  90 tablet  3  . vitamin C (ASCORBIC ACID) 500 MG tablet Take 500 mg by mouth daily.      . vitamin E 400 UNIT capsule Take 400 Units by mouth daily.       No current facility-administered medications for this visit.    Past Medical History  Diagnosis Date  . Pacemaker 2011    St. Jude Accent DDDR ; this is the third generator this pt. has had  . Diabetes  mellitus without complication   . Cancer   . Shingles   . GI bleed   . Gastric ulcer   . UTI (lower urinary tract infection)   . Blepharitis   . Hx of echocardiogram 08-15-2012    EF  45-50 % very difficult study, severe LVH, pacemaker, trace TR,mildly enlarged RV,; aorta mildly calcified  . History of stress test 06/06/2012    low risk scan. fixed inferior defect consistant with prior infarction with moderatly reduced EF. no appreciable evidence of ischemia. no significant change from previous study  . Abnormal Doppler ultrasound of carotid artery 12/16/2012    mildly abnormal doppler . no prior studies to compare to. follow up studies are recommended when clinically indicated.  . Exogenous obesity     Past Surgical History  Procedure Laterality Date  .  Knee surgery Right   . Spine surgery    . Prostate biopsy    . Prostate seed implants    . Cardiac stents    . Wisdom tooth extraction    . Cataract extraction    . Cardiac catheterization  12-29-2003    rt. coronary angiography was done with a 6 french 4 cm taper coronary cath. This demonstrated  approximately 70 to 75% mildly segmental stenosis in the mid RCA before the acute margin          . Cardiac catheterization  11-19-2003    diagnostic cath,, pt. to get second cath to have stent placed    No family history on file.  History   Social History  . Marital Status: Married    Spouse Name: N/A    Number of Children: N/A  . Years of Education: N/A   Occupational History  . Not on file.   Social History Main Topics  . Smoking status: Never Smoker   . Smokeless tobacco: Not on file  . Alcohol Use: Yes     Comment: daily wine  . Drug Use: No  . Sexual Activity: Not on file   Other Topics Concern  . Not on file   Social History Narrative  . No narrative on file    Review of systems: The patient specifically denies any chest pain at rest or with exertion, dyspnea at rest or with exertion, orthopnea, paroxysmal nocturnal dyspnea, syncope, palpitations, focal neurological deficits, intermittent claudication, lower extremity edema, unexplained weight gain, cough, hemoptysis or wheezing.  The patient also denies abdominal pain, nausea, vomiting, dysphagia, diarrhea, constipation, polyuria, polydipsia, dysuria, hematuria, frequency, urgency, abnormal bleeding or bruising, fever, chills, unexpected weight changes, mood swings, change in skin or hair texture, change in voice quality, auditory or visual problems, allergic reactions or rashes, new musculoskeletal complaints other than usual "aches and pains".   PHYSICAL EXAM BP 140/82  Pulse 73  Resp 16  Ht 5\' 8"  (1.727 m)  Wt 118.797 kg (261 lb 14.4 oz)  BMI 39.83 kg/m2 Severely obese General: Alert, oriented x3, no  distress Head: no evidence of trauma, PERRL, EOMI, no exophtalmos or lid lag, no myxedema, no xanthelasma; normal ears, nose and oropharynx Neck: normal jugular venous pulsations and no hepatojugular reflux; brisk carotid pulses without delay and no carotid bruits Chest: clear to auscultation, no signs of consolidation by percussion or palpation, normal fremitus, symmetrical and full respiratory excursions Cardiovascular: normal position and quality of the apical impulse, regular rhythm, normal first and paradoxically split second heart sounds, 2/6 early systolic murmur, no rubs or gallops Abdomen: no tenderness or distention, no masses by palpation,  no abnormal pulsatility or arterial bruits, normal bowel sounds, no hepatosplenomegaly Extremities: no clubbing, cyanosis or edema; 2+ radial, ulnar and brachial pulses bilaterally; 2+ right femoral, posterior tibial and dorsalis pedis pulses; 2+ left femoral, posterior tibial and dorsalis pedis pulses; no subclavian or femoral bruits Neurological: grossly nonfocal   EKG: Sinus rhythm, left bundle branch block  Lipid Panel  Total cholesterol 171, triglycerides 266, LDL 74, HDL 39 (March 2014) Hemoglobin A1c 7.2% BMET BUN 33, creatinine 1.65, glucose 174, potassium 4.8, normal liver function tests, hemoglobin 15  ASSESSMENT AND PLAN Pacemaker - dual chamber St. Jude RF Excellent lead function, 25 years following implantation. Minor reprogramming changes made today. Continue remote monitoring every 3 months. I'll see him back in the clinic in 6 months.  CAD (coronary artery disease) History of drug-eluting stents placed in the LAD artery and right coronary artery in 2005 with low risk nuclear study since and no symptoms of active coronary insufficiency.  OSA on CPAP    Mixed hyperlipidemia Secondary to obesity and diabetes mellitus. A1c is not yet optimal level. Suspect triglycerides will decrease with optimal glycemic control. LDL is close to  target at 70 mg/dL. No changes made to his medications today.   Orders Placed This Encounter  Procedures  . EKG 12-Lead   No orders of the defined types were placed in this encounter.    Holli Humbles, MD, [place] [phone] office [phone] pager

## 2013-07-24 NOTE — Assessment & Plan Note (Signed)
History of drug-eluting stents placed in the LAD artery and right coronary artery in 2005 with low risk nuclear study since and no symptoms of active coronary insufficiency.

## 2013-07-24 NOTE — Assessment & Plan Note (Signed)
Secondary to obesity and diabetes mellitus. A1c is not yet optimal level. Suspect triglycerides will decrease with optimal glycemic control. LDL is close to target at 70 mg/dL. No changes made to his medications today.

## 2013-07-24 NOTE — Patient Instructions (Addendum)
Remote monitoring is used to monitor your Pacemaker of ICD from home. This monitoring reduces the number of office visits required to check your device to one time per year. It allows [place] to keep an eye on the functioning of your device to ensure it is working properly. You are scheduled for a device check from home on October 27, 2013. You may send your transmission at any time that day. If you have a wireless device, the transmission will be sent automatically. After your physician reviews your transmission, you will receive a postcard with your next transmission date.  No changes   Your physician wants you to follow-up in 6 months Dr Sallyanne Kuster. You will receive a reminder letter in the mail two months in advance. If you don't receive a letter, please call our office to schedule the follow-up appointment.

## 2013-07-24 NOTE — Assessment & Plan Note (Addendum)
Excellent lead function, 25 years following implantation. Minor reprogramming changes made today. Continue remote monitoring every 3 months. I'll see him back in the clinic in 6 months.

## 2013-07-28 LAB — MDC_IDC_ENUM_SESS_TYPE_INCLINIC
Battery Remaining Longevity: 7.6
Battery Voltage: 2.96 V
Brady Statistic RA Percent Paced: 51 %
Brady Statistic RV Percent Paced: 87 %
Implantable Pulse Generator Model: 2210
Implantable Pulse Generator Serial Number: 7174903
Lead Channel Impedance Value: 310 Ohm
Lead Channel Impedance Value: 540 Ohm
Lead Channel Pacing Threshold Amplitude: 1 V
Lead Channel Pacing Threshold Amplitude: 1 V
Lead Channel Pacing Threshold Pulse Width: 0.4 ms
Lead Channel Pacing Threshold Pulse Width: 0.6 ms
Lead Channel Sensing Intrinsic Amplitude: 4.8 mV
Lead Channel Sensing Intrinsic Amplitude: 9.1 mV
Lead Channel Setting Pacing Amplitude: 1.375
Lead Channel Setting Pacing Amplitude: 1.875
Lead Channel Setting Pacing Pulse Width: 0.6 ms
Lead Channel Setting Sensing Sensitivity: 4 mV

## 2013-08-01 ENCOUNTER — Other Ambulatory Visit: Payer: Self-pay | Admitting: *Deleted

## 2013-08-01 DIAGNOSIS — E1149 Type 2 diabetes mellitus with other diabetic neurological complication: Secondary | ICD-10-CM | POA: Diagnosis not present

## 2013-08-01 DIAGNOSIS — N183 Chronic kidney disease, stage 3 unspecified: Secondary | ICD-10-CM | POA: Diagnosis not present

## 2013-08-01 DIAGNOSIS — R946 Abnormal results of thyroid function studies: Secondary | ICD-10-CM | POA: Diagnosis not present

## 2013-08-01 DIAGNOSIS — K3184 Gastroparesis: Secondary | ICD-10-CM | POA: Diagnosis not present

## 2013-08-01 MED ORDER — CLOPIDOGREL BISULFATE 75 MG PO TABS: 75.0000 mg | ORAL_TABLET | Freq: Every day | ORAL | Status: DC

## 2013-08-01 NOTE — Telephone Encounter (Signed)
Rx was sent to pharmacy electronically. 

## 2013-08-26 DIAGNOSIS — N183 Chronic kidney disease, stage 3 unspecified: Secondary | ICD-10-CM | POA: Diagnosis not present

## 2013-08-26 DIAGNOSIS — Z6841 Body Mass Index (BMI) 40.0 and over, adult: Secondary | ICD-10-CM | POA: Diagnosis not present

## 2013-08-26 DIAGNOSIS — E1129 Type 2 diabetes mellitus with other diabetic kidney complication: Secondary | ICD-10-CM | POA: Diagnosis not present

## 2013-08-31 ENCOUNTER — Other Ambulatory Visit: Payer: Self-pay | Admitting: Cardiovascular Disease

## 2013-09-01 NOTE — Telephone Encounter (Signed)
Rx was sent to pharmacy electronically. 

## 2013-09-16 DIAGNOSIS — M79609 Pain in unspecified limb: Secondary | ICD-10-CM | POA: Diagnosis not present

## 2013-09-16 DIAGNOSIS — B351 Tinea unguium: Secondary | ICD-10-CM | POA: Diagnosis not present

## 2013-09-21 DIAGNOSIS — N390 Urinary tract infection, site not specified: Secondary | ICD-10-CM | POA: Diagnosis not present

## 2013-09-21 DIAGNOSIS — R300 Dysuria: Secondary | ICD-10-CM | POA: Diagnosis not present

## 2013-10-02 ENCOUNTER — Other Ambulatory Visit: Payer: Self-pay | Admitting: *Deleted

## 2013-10-02 DIAGNOSIS — R946 Abnormal results of thyroid function studies: Secondary | ICD-10-CM | POA: Diagnosis not present

## 2013-10-02 DIAGNOSIS — N183 Chronic kidney disease, stage 3 unspecified: Secondary | ICD-10-CM | POA: Diagnosis not present

## 2013-10-02 DIAGNOSIS — K3184 Gastroparesis: Secondary | ICD-10-CM | POA: Diagnosis not present

## 2013-10-02 DIAGNOSIS — E1149 Type 2 diabetes mellitus with other diabetic neurological complication: Secondary | ICD-10-CM | POA: Diagnosis not present

## 2013-10-02 MED ORDER — LOSARTAN POTASSIUM 100 MG PO TABS: 100.0000 mg | ORAL_TABLET | Freq: Every day | ORAL | Status: DC

## 2013-10-02 NOTE — Telephone Encounter (Signed)
Rx was sent to pharmacy electronically. 

## 2013-10-27 ENCOUNTER — Ambulatory Visit
Admission: RE | Admit: 2013-10-27 | Discharge: 2013-10-27 | Disposition: A | Discharge status: Home / Self Care | Payer: Medicare Other | Source: Ambulatory Visit | Attending: Family Medicine | Admitting: Family Medicine

## 2013-10-27 ENCOUNTER — Ambulatory Visit (INDEPENDENT_AMBULATORY_CARE_PROVIDER_SITE_OTHER): Payer: Medicare Other | Admitting: *Deleted

## 2013-10-27 ENCOUNTER — Encounter: Payer: Self-pay | Admitting: Cardiovascular Disease

## 2013-10-27 ENCOUNTER — Other Ambulatory Visit: Payer: Self-pay | Admitting: Family Medicine

## 2013-10-27 DIAGNOSIS — I2589 Other forms of chronic ischemic heart disease: Secondary | ICD-10-CM

## 2013-10-27 DIAGNOSIS — Z950 Presence of cardiac pacemaker: Secondary | ICD-10-CM

## 2013-10-27 DIAGNOSIS — IMO0002 Reserved for concepts with insufficient information to code with codable children: Secondary | ICD-10-CM | POA: Diagnosis not present

## 2013-10-27 DIAGNOSIS — M169 Osteoarthritis of hip, unspecified: Secondary | ICD-10-CM | POA: Diagnosis not present

## 2013-10-27 DIAGNOSIS — M1610 Unilateral primary osteoarthritis, unspecified hip: Secondary | ICD-10-CM | POA: Diagnosis not present

## 2013-10-27 DIAGNOSIS — I255 Ischemic cardiomyopathy: Secondary | ICD-10-CM

## 2013-10-27 NOTE — Progress Notes (Signed)
Remote pacemaker transmission.   

## 2013-10-31 LAB — MDC_IDC_ENUM_SESS_TYPE_REMOTE
Battery Remaining Longevity: 84 mo
Battery Remaining Percentage: 74 %
Battery Voltage: 2.95 V
Brady Statistic AP VP Percent: 41 %
Brady Statistic AP VS Percent: 2.7 %
Brady Statistic AS VP Percent: 52 %
Brady Statistic AS VS Percent: 3.8 %
Brady Statistic RA Percent Paced: 44 %
Brady Statistic RV Percent Paced: 93 %
Date Time Interrogation Session: 20150608080242
Implantable Pulse Generator Model: 2210
Implantable Pulse Generator Serial Number: 7174903
Lead Channel Impedance Value: 310 Ohm
Lead Channel Impedance Value: 540 Ohm
Lead Channel Pacing Threshold Amplitude: 0.875 V
Lead Channel Pacing Threshold Amplitude: 1 V
Lead Channel Pacing Threshold Pulse Width: 0.4 ms
Lead Channel Pacing Threshold Pulse Width: 0.6 ms
Lead Channel Sensing Intrinsic Amplitude: 5 mV
Lead Channel Sensing Intrinsic Amplitude: 9 mV
Lead Channel Setting Pacing Amplitude: 1.25 V
Lead Channel Setting Pacing Amplitude: 1.875
Lead Channel Setting Pacing Pulse Width: 0.6 ms
Lead Channel Setting Sensing Sensitivity: 3 mV

## 2013-11-03 ENCOUNTER — Ambulatory Visit: Payer: Medicare Other | Attending: Family Medicine | Admitting: Physical Therapy

## 2013-11-03 DIAGNOSIS — M25559 Pain in unspecified hip: Secondary | ICD-10-CM | POA: Insufficient documentation

## 2013-11-03 DIAGNOSIS — IMO0001 Reserved for inherently not codable concepts without codable children: Secondary | ICD-10-CM | POA: Diagnosis not present

## 2013-11-04 ENCOUNTER — Ambulatory Visit: Payer: Medicare Other | Admitting: Physical Therapy

## 2013-11-09 DIAGNOSIS — T169XXA Foreign body in ear, unspecified ear, initial encounter: Secondary | ICD-10-CM | POA: Diagnosis not present

## 2013-11-10 ENCOUNTER — Ambulatory Visit: Payer: Medicare Other | Admitting: Physical Therapy

## 2013-11-12 ENCOUNTER — Ambulatory Visit: Payer: Medicare Other | Admitting: Physical Therapy

## 2013-11-18 ENCOUNTER — Encounter: Payer: Self-pay | Admitting: Cardiology

## 2013-11-18 ENCOUNTER — Ambulatory Visit: Payer: Medicare Other | Admitting: Physical Therapy

## 2013-11-20 ENCOUNTER — Ambulatory Visit: Payer: Medicare Other | Attending: Family Medicine | Admitting: Physical Therapy

## 2013-11-20 DIAGNOSIS — IMO0001 Reserved for inherently not codable concepts without codable children: Secondary | ICD-10-CM | POA: Diagnosis not present

## 2013-11-20 DIAGNOSIS — M25559 Pain in unspecified hip: Secondary | ICD-10-CM | POA: Insufficient documentation

## 2013-11-24 ENCOUNTER — Other Ambulatory Visit: Payer: Self-pay | Admitting: *Deleted

## 2013-11-24 MED ORDER — FUROSEMIDE 20 MG PO TABS: 20.0000 mg | ORAL_TABLET | Freq: Every day | ORAL | Status: DC

## 2013-11-25 ENCOUNTER — Ambulatory Visit: Payer: Medicare Other | Admitting: Physical Therapy

## 2013-11-25 DIAGNOSIS — IMO0001 Reserved for inherently not codable concepts without codable children: Secondary | ICD-10-CM | POA: Diagnosis not present

## 2013-11-25 DIAGNOSIS — B351 Tinea unguium: Secondary | ICD-10-CM | POA: Diagnosis not present

## 2013-11-25 DIAGNOSIS — M79609 Pain in unspecified limb: Secondary | ICD-10-CM | POA: Diagnosis not present

## 2013-11-27 ENCOUNTER — Ambulatory Visit: Payer: Medicare Other | Admitting: Physical Therapy

## 2013-11-27 DIAGNOSIS — IMO0001 Reserved for inherently not codable concepts without codable children: Secondary | ICD-10-CM | POA: Diagnosis not present

## 2013-12-01 ENCOUNTER — Other Ambulatory Visit: Payer: Self-pay | Admitting: *Deleted

## 2013-12-01 MED ORDER — FUROSEMIDE 20 MG PO TABS: 20.0000 mg | ORAL_TABLET | Freq: Every day | ORAL | Status: DC

## 2013-12-01 NOTE — Telephone Encounter (Signed)
Rx was sent to pharmacy electronically. 

## 2013-12-02 ENCOUNTER — Encounter: Payer: Medicare Other | Admitting: Physical Therapy

## 2013-12-04 ENCOUNTER — Encounter: Payer: Medicare Other | Admitting: Physical Therapy

## 2013-12-15 DIAGNOSIS — R946 Abnormal results of thyroid function studies: Secondary | ICD-10-CM | POA: Diagnosis not present

## 2013-12-15 DIAGNOSIS — K3184 Gastroparesis: Secondary | ICD-10-CM | POA: Diagnosis not present

## 2013-12-15 DIAGNOSIS — E1149 Type 2 diabetes mellitus with other diabetic neurological complication: Secondary | ICD-10-CM | POA: Diagnosis not present

## 2013-12-15 DIAGNOSIS — N183 Chronic kidney disease, stage 3 unspecified: Secondary | ICD-10-CM | POA: Diagnosis not present

## 2013-12-18 ENCOUNTER — Other Ambulatory Visit: Payer: Self-pay | Admitting: Cardiovascular Disease

## 2013-12-18 NOTE — Telephone Encounter (Deleted)
Rx was sent to pharmacy electronically. 

## 2013-12-18 NOTE — Telephone Encounter (Signed)
Pt dosage increased for his Furosemide 20 mg. He need a new prescription for it #45 instead of #30. Please call to [facility]

## 2013-12-18 NOTE — Telephone Encounter (Signed)
Attempted to call patient to notify him that updated refills were sent to the pharmacy on 12/01/2013 but could not reach the patient.

## 2013-12-22 ENCOUNTER — Other Ambulatory Visit: Payer: Self-pay | Admitting: *Deleted

## 2013-12-22 MED ORDER — METOPROLOL TARTRATE 50 MG PO TABS: ORAL_TABLET | ORAL | Status: DC

## 2013-12-29 ENCOUNTER — Telehealth: Payer: Self-pay | Admitting: Cardiovascular Disease

## 2013-12-29 MED ORDER — PANTOPRAZOLE SODIUM 40 MG PO TBEC: 40.0000 mg | DELAYED_RELEASE_TABLET | Freq: Every day | ORAL | Status: DC

## 2013-12-29 MED ORDER — CLOPIDOGREL BISULFATE 75 MG PO TABS: 75.0000 mg | ORAL_TABLET | Freq: Every day | ORAL | Status: DC

## 2013-12-29 MED ORDER — ROSUVASTATIN CALCIUM 40 MG PO TABS: 40.0000 mg | ORAL_TABLET | Freq: Every day | ORAL | Status: DC

## 2013-12-29 MED ORDER — METOPROLOL TARTRATE 50 MG PO TABS: ORAL_TABLET | ORAL | Status: DC

## 2013-12-29 MED ORDER — LOSARTAN POTASSIUM 100 MG PO TABS: 100.0000 mg | ORAL_TABLET | Freq: Every day | ORAL | Status: DC

## 2013-12-29 MED ORDER — NITROGLYCERIN 0.4 MG SL SUBL: 0.4000 mg | SUBLINGUAL_TABLET | SUBLINGUAL | Status: DC | PRN

## 2013-12-29 MED ORDER — POTASSIUM CHLORIDE ER 10 MEQ PO TBCR: 10.0000 meq | EXTENDED_RELEASE_TABLET | Freq: Every day | ORAL | Status: DC

## 2013-12-29 MED ORDER — FUROSEMIDE 20 MG PO TABS: 20.0000 mg | ORAL_TABLET | Freq: Every day | ORAL | Status: DC

## 2013-12-29 NOTE — Telephone Encounter (Signed)
[name] called in stating that his prescriptions need to be sent to the [facility] on [address] instead of the [address] location..Please call  Thanks

## 2013-12-29 NOTE — Telephone Encounter (Signed)
Prescriptions resent to [place]  States they have been going to [place] which is incorrect.

## 2013-12-31 ENCOUNTER — Telehealth: Payer: Self-pay | Admitting: Cardiovascular Disease

## 2013-12-31 MED ORDER — METOPROLOL SUCCINATE ER 25 MG PO TB24: 37.5000 mg | ORAL_TABLET | Freq: Every day | ORAL | Status: DC

## 2013-12-31 NOTE — Telephone Encounter (Signed)
It should be succinate - tartrate was an error

## 2013-12-31 NOTE — Telephone Encounter (Signed)
Returned call to patient. Metoprolol tartrate 50mg  tablet (take 1.5 tablets - 75mg ) once daily was sent to Rx yesterday. Patient states he has been taking metoprolol succinate 25mg  (1.5 tablets 37.5mg  daily) since was previously Rx'ed by Dr. Rollene Fare. Informed patient that tartrate has been on his med list since his last OV with Dr. Sallyanne Kuster in March.   Will defer to Dr. Sallyanne Kuster to advise on medications.

## 2013-12-31 NOTE — Telephone Encounter (Signed)
Rx was sent to pharmacy electronically. 

## 2013-12-31 NOTE — Telephone Encounter (Signed)
New Prob    Pt has some questions regarding his metoprolol (LOPRESSOR) 50 MG tablet. Please call.

## 2014-01-01 ENCOUNTER — Other Ambulatory Visit (HOSPITAL_COMMUNITY): Payer: Self-pay | Admitting: Unknown Physician Specialty

## 2014-01-01 ENCOUNTER — Ambulatory Visit (HOSPITAL_COMMUNITY)
Admission: RE | Admit: 2014-01-01 | Discharge: 2014-01-01 | Disposition: A | Discharge status: Home / Self Care | Payer: Medicare Other | Source: Ambulatory Visit | Attending: Family Medicine | Admitting: Family Medicine

## 2014-01-01 DIAGNOSIS — M7989 Other specified soft tissue disorders: Secondary | ICD-10-CM | POA: Diagnosis not present

## 2014-01-01 DIAGNOSIS — H9209 Otalgia, unspecified ear: Secondary | ICD-10-CM | POA: Diagnosis not present

## 2014-01-01 NOTE — Progress Notes (Signed)
VASCULAR LAB PRELIMINARY  PRELIMINARY  PRELIMINARY  PRELIMINARY  Right lower extremity venous duplex completed.    Preliminary report:  Right:  No evidence of DVT, superficial thrombosis, or Baker's cyst.  Jeaninne Lodico, RVT 01/01/2014, 1:22 PM

## 2014-01-28 ENCOUNTER — Encounter: Payer: Self-pay | Admitting: Cardiovascular Disease

## 2014-01-29 DIAGNOSIS — B351 Tinea unguium: Secondary | ICD-10-CM | POA: Diagnosis not present

## 2014-01-29 DIAGNOSIS — M79609 Pain in unspecified limb: Secondary | ICD-10-CM | POA: Diagnosis not present

## 2014-02-03 DIAGNOSIS — R946 Abnormal results of thyroid function studies: Secondary | ICD-10-CM | POA: Diagnosis not present

## 2014-03-09 ENCOUNTER — Ambulatory Visit (INDEPENDENT_AMBULATORY_CARE_PROVIDER_SITE_OTHER): Payer: Medicare Other | Admitting: Cardiovascular Disease

## 2014-03-09 ENCOUNTER — Encounter: Payer: Self-pay | Admitting: Cardiovascular Disease

## 2014-03-09 VITALS — BP 140/82 | HR 71 | Resp 20 | Ht 68.0 in | Wt 266.1 lb

## 2014-03-09 DIAGNOSIS — I2510 Atherosclerotic heart disease of native coronary artery without angina pectoris: Secondary | ICD-10-CM | POA: Diagnosis not present

## 2014-03-09 DIAGNOSIS — I495 Sick sinus syndrome: Secondary | ICD-10-CM | POA: Insufficient documentation

## 2014-03-09 DIAGNOSIS — I441 Atrioventricular block, second degree: Secondary | ICD-10-CM

## 2014-03-09 DIAGNOSIS — N183 Chronic kidney disease, stage 3 unspecified: Secondary | ICD-10-CM

## 2014-03-09 DIAGNOSIS — Z950 Presence of cardiac pacemaker: Secondary | ICD-10-CM

## 2014-03-09 DIAGNOSIS — E1122 Type 2 diabetes mellitus with diabetic chronic kidney disease: Secondary | ICD-10-CM

## 2014-03-09 DIAGNOSIS — E782 Mixed hyperlipidemia: Secondary | ICD-10-CM

## 2014-03-09 DIAGNOSIS — Z9989 Dependence on other enabling machines and devices: Secondary | ICD-10-CM

## 2014-03-09 DIAGNOSIS — I255 Ischemic cardiomyopathy: Secondary | ICD-10-CM

## 2014-03-09 DIAGNOSIS — G4733 Obstructive sleep apnea (adult) (pediatric): Secondary | ICD-10-CM

## 2014-03-09 HISTORY — DX: Atrioventricular block, second degree: I44.1

## 2014-03-09 HISTORY — DX: Sick sinus syndrome: I49.5

## 2014-03-09 NOTE — Assessment & Plan Note (Signed)
His device was implanted 25 years ago for sinus node dysfunction but he has also developed AV block and has a fairly high frequency of ventricular pacing. Despite this he does not have any signs or symptoms of congestive heart failure even though his ejection fraction is only around 45%. No changes are made to device settings today. Will continue with every 3 month remote bowel loads and office visit in one year.

## 2014-03-09 NOTE — Assessment & Plan Note (Signed)
No current signs or symptoms of congestive heart failure

## 2014-03-09 NOTE — Patient Instructions (Signed)
Remote monitoring is used to monitor your Pacemaker of ICD from home. This monitoring reduces the number of office visits required to check your device to one time per year. It allows [place] to keep an eye on the functioning of your device to ensure it is working properly. You are scheduled for a device check from home on 3 Months. You may send your transmission at any time that day. If you have a wireless device, the transmission will be sent automatically. After your physician reviews your transmission, you will receive a postcard with your next transmission date.  Your physician wants you to follow-up in: 1 Year with Dr Sallyanne Kuster You will receive a reminder letter in the mail two months in advance. If you don't receive a letter, please call our office to schedule the follow-up appointment.

## 2014-03-09 NOTE — Assessment & Plan Note (Signed)
Intolerant of CPAP. ?

## 2014-03-09 NOTE — Assessment & Plan Note (Addendum)
Need to retrieve his most recent labs. Last year he still had substantial hypertriglyceridemia despite reasonably good glycemic control. Review most recent labs. Adding fenofibrate is a consideration.

## 2014-03-09 NOTE — Assessment & Plan Note (Signed)
Currently asymptomatic. He has a history of drug-eluting stents placed in the LAD and right coronary arteries in 2005. He had a low risk myocardial perfusion study in January 2014.

## 2014-03-09 NOTE — Progress Notes (Signed)
Patient ID: [name], male   DOB: 06/20/1931, 80 y.o.   MRN: [id number]     Reason for office visit Pacemaker followup  He has a dual-chamber permanent pacemaker that was initially implanted in 1990 for symptomatic bradycardia, with subsequent generator change outs in 2002 and 2011. His initial leads are still operating with excellent parameters. He is enrolled in remote monitoring using the [employer] system.  He has no cardiac complaints today.  Device interrogation shows 40% atrial pacing and 79% ventricular pacing which is comparable to his historical trends. Battery 2.93 V with an estimated longevity of 7-8 years. He has not had mode switch episodes (previously infrequent and very brief episodes of mode switch related to bursts of atrial tachycardia). No atrial fibrillation was not recorded since his last device check. At presentation today he is in sinus rhythm with V paced beats. Excellent lead sensing/impedance/capture thresholds. No permanent changes were made to device settings.  Additional problems include insulin-requiring diabetes mellitus, hyperlipidemia and hypertension, all of which are well controlled. His most recent nuclear study in January 2014 showed an ejection of 34% with a fixed inferior defect and no reversible ischemia. I'm not sure about the accuracy of left ventricular ejection fraction assessment. His most recent echocardiogram performed around the same date showed a mildly depressed LVEF of 45-50% (note the presence of right ventricular pacing which may alter the accuracy of EF assessment). 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 He is now retired and is an avid Geophysicist/field seismologist : He mostly likes to photograph buildings and has an exhibit coming up at [employer].   No Known Allergies  Current Outpatient  Prescriptions  Medication Sig Dispense Refill  . aspirin 81 MG tablet Take 81 mg by mouth daily.      . cholecalciferol (VITAMIN D) 1000 UNITS tablet Take 2,000 Units by mouth daily.      . clopidogrel (PLAVIX) 75 MG tablet Take 1 tablet (75 mg total) by mouth daily.  30 tablet  6  . Coenzyme Q10 (COQ-10) 150 MG CAPS Take 1 capsule by mouth daily.       . furosemide (LASIX) 20 MG tablet Take 1 tablet (20 mg total) by mouth daily. ALTERNATING with 2 tablets as directed.  60 tablet  6  . insulin lispro protamine-lispro (HUMALOG 75/25) (75-25) 100 UNIT/ML SUSP injection Inject 30-35 Units into the skin 2 (two) times daily with a meal. Sliding scale      . levothyroxine (SYNTHROID, LEVOTHROID) 25 MCG tablet       . losartan (COZAAR) 100 MG tablet Take 1 tablet (100 mg total) by mouth daily.  30 tablet  6  . metoprolol succinate (TOPROL-XL) 25 MG 24 hr tablet Take 1.5 tablets (37.5 mg total) by mouth daily.  45 tablet  6  . nitroGLYCERIN (NITROSTAT) 0.4 MG SL tablet Place 1 tablet (0.4 mg total) under the tongue every 5 (five) minutes as needed for chest pain.  25 tablet  3  . Omega-3 Fatty Acids (FISH OIL) 1000 MG CAPS Take 2,000 mg by mouth daily.      . pantoprazole (PROTONIX) 40 MG tablet Take 1 tablet (40 mg total) by mouth daily.  30 tablet  6  . potassium chloride (K-DUR) 10 MEQ tablet Take 1-2 tablets (10-20 mEq total) by mouth daily. Takes 57mEq one day and 81mEq the next (  alternates)  60 tablet  6  . rosuvastatin (CRESTOR) 40 MG tablet Take 1 tablet (40 mg total) by mouth daily.  30 tablet  6  . vitamin C (ASCORBIC ACID) 500 MG tablet Take 500 mg by mouth daily.      . vitamin E 400 UNIT capsule Take 400 Units by mouth daily.       No current facility-administered medications for this visit.    Past Medical History  Diagnosis Date  . Pacemaker 2011    St. Jude Accent DDDR ; this is the third generator this pt. has had  . Diabetes mellitus without complication   . Cancer   . Shingles    . GI bleed   . Gastric ulcer   . UTI (lower urinary tract infection)   . Blepharitis   . Hx of echocardiogram 08-15-2012    EF  45-50 % very difficult study, severe LVH, pacemaker, trace TR,mildly enlarged RV,; aorta mildly calcified  . History of stress test 06/06/2012    low risk scan. fixed inferior defect consistant with prior infarction with moderatly reduced EF. no appreciable evidence of ischemia. no significant change from previous study  . Abnormal Doppler ultrasound of carotid artery 12/16/2012    mildly abnormal doppler . no prior studies to compare to. follow up studies are recommended when clinically indicated.  . Exogenous obesity     Past Surgical History  Procedure Laterality Date  . Knee surgery Right   . Spine surgery    . Prostate biopsy    . Prostate seed implants    . Cardiac stents    . Wisdom tooth extraction    . Cataract extraction    . Cardiac catheterization  12-29-2003    rt. coronary angiography was done with a 6 french 4 cm taper coronary cath. This demonstrated  approximately 70 to 75% mildly segmental stenosis in the mid RCA before the acute margin          . Cardiac catheterization  11-19-2003    diagnostic cath,, pt. to get second cath to have stent placed    No family history on file.  History   Social History  . Marital Status: Married    Spouse Name: N/A    Number of Children: N/A  . Years of Education: N/A   Occupational History  . Not on file.   Social History Main Topics  . Smoking status: Never Smoker   . Smokeless tobacco: Not on file  . Alcohol Use: Yes     Comment: daily wine  . Drug Use: No  . Sexual Activity: Not on file   Other Topics Concern  . Not on file   Social History Narrative  . No narrative on file    Review of systems: The patient specifically denies any chest pain at rest or with exertion, dyspnea at rest or with exertion, orthopnea, paroxysmal nocturnal dyspnea, syncope, palpitations, focal neurological  deficits, intermittent claudication, lower extremity edema, unexplained weight gain, cough, hemoptysis or wheezing.  The patient also denies abdominal pain, nausea, vomiting, dysphagia, diarrhea, constipation, polyuria, polydipsia, dysuria, hematuria, frequency, urgency, abnormal bleeding or bruising, fever, chills, unexpected weight changes, mood swings, change in skin or hair texture, change in voice quality, auditory or visual problems, allergic reactions or rashes, new musculoskeletal complaints other than usual "aches and pains".   PHYSICAL EXAM BP 140/82  Pulse 71  Resp 20  Ht 5\' 8"  (1.727 m)  Wt 120.702 kg (266 lb 1.6 oz)  BMI 40.47 kg/m2  General: Alert, oriented x3, no distress, morbidly obese, which limits the exam Head: no evidence of trauma, PERRL, EOMI, no exophtalmos or lid lag, no myxedema, no xanthelasma; normal ears, nose and oropharynx Neck: normal jugular venous pulsations and no hepatojugular reflux; brisk carotid pulses without delay and no carotid bruits Chest: clear to auscultation, no signs of consolidation by percussion or palpation, normal fremitus, symmetrical and full respiratory excursions Cardiovascular: Unable to identify the apical impulse, regular rhythm, normal first and second heart sounds, no murmurs, rubs or gallops Abdomen: no tenderness or distention, no masses by palpation, no abnormal pulsatility or arterial bruits, normal bowel sounds, no hepatosplenomegaly Extremities: no clubbing, cyanosis or edema; 2+ radial, ulnar and brachial pulses bilaterally; 2+ right femoral, posterior tibial and dorsalis pedis pulses; 2+ left femoral, posterior tibial and dorsalis pedis pulses; no subclavian or femoral bruits Neurological: grossly nonfocal  Lipid Panel / Labs  March 2014 hemoglobin A1c 7.2%, total cholesterol 171, triglycerides 266, HDL 39, LDL 74, creatinine 1.67, BUN 31, potassium 5.1  ASSESSMENT AND PLAN Pacemaker - dual chamber St. Jude RF His device  was implanted 25 years ago for sinus node dysfunction but he has also developed AV block and has a fairly high frequency of ventricular pacing. Despite this he does not have any signs or symptoms of congestive heart failure even though his ejection fraction is only around 45%. No changes are made to device settings today. Will continue with every 3 month remote bowel loads and office visit in one year.  CAD (coronary artery disease) Currently asymptomatic. He has a history of drug-eluting stents placed in the LAD and right coronary arteries in 2005. He had a low risk myocardial perfusion study in January 2014.  Cardiomyopathy, ischemic No current signs or symptoms of congestive heart failure  OSA (obstructive sleep apnea) Intolerant of CPAP  Mixed hyperlipidemia Need to retrieve his most recent labs. Last year he still had substantial hypertriglyceridemia despite reasonably good glycemic control. Review most recent labs. Adding fenofibrate is a consideration.   No orders of the defined types were placed in this encounter.   Meds ordered this encounter  Medications  . levothyroxine (SYNTHROID, LEVOTHROID) 25 MCG tablet    Sig:     Holli Humbles, MD, [facility] [phone] office [phone] pager

## 2014-03-16 LAB — MDC_IDC_ENUM_SESS_TYPE_INCLINIC
Brady Statistic RA Percent Paced: 40 %
Brady Statistic RV Percent Paced: 79 %
Implantable Pulse Generator Model: 2210
Implantable Pulse Generator Serial Number: 7174903
Lead Channel Impedance Value: 300 Ohm
Lead Channel Impedance Value: 540 Ohm
Lead Channel Pacing Threshold Amplitude: 0.875 V
Lead Channel Pacing Threshold Amplitude: 1 V
Lead Channel Pacing Threshold Pulse Width: 0.4 ms
Lead Channel Pacing Threshold Pulse Width: 0.6 ms
Lead Channel Sensing Intrinsic Amplitude: 5 mV
Lead Channel Sensing Intrinsic Amplitude: 9.4 mV
Lead Channel Setting Pacing Amplitude: 1.25 V
Lead Channel Setting Pacing Amplitude: 1.875
Lead Channel Setting Pacing Pulse Width: 0.6 ms
Lead Channel Setting Sensing Sensitivity: 3 mV

## 2014-03-18 DIAGNOSIS — E1142 Type 2 diabetes mellitus with diabetic polyneuropathy: Secondary | ICD-10-CM | POA: Diagnosis not present

## 2014-03-18 DIAGNOSIS — N183 Chronic kidney disease, stage 3 (moderate): Secondary | ICD-10-CM | POA: Diagnosis not present

## 2014-03-18 DIAGNOSIS — E039 Hypothyroidism, unspecified: Secondary | ICD-10-CM | POA: Diagnosis not present

## 2014-03-18 DIAGNOSIS — Z23 Encounter for immunization: Secondary | ICD-10-CM | POA: Diagnosis not present

## 2014-03-30 ENCOUNTER — Encounter: Payer: Self-pay | Admitting: Cardiovascular Disease

## 2014-04-06 DIAGNOSIS — M79674 Pain in right toe(s): Secondary | ICD-10-CM | POA: Diagnosis not present

## 2014-04-06 DIAGNOSIS — M79675 Pain in left toe(s): Secondary | ICD-10-CM | POA: Diagnosis not present

## 2014-04-06 DIAGNOSIS — B351 Tinea unguium: Secondary | ICD-10-CM | POA: Diagnosis not present

## 2014-05-25 ENCOUNTER — Ambulatory Visit
Admission: RE | Admit: 2014-05-25 | Discharge: 2014-05-25 | Disposition: A | Discharge status: Home / Self Care | Payer: Medicare Other | Source: Ambulatory Visit | Attending: Family Medicine | Admitting: Family Medicine

## 2014-05-25 ENCOUNTER — Other Ambulatory Visit: Payer: Self-pay | Admitting: Family Medicine

## 2014-05-25 DIAGNOSIS — M47816 Spondylosis without myelopathy or radiculopathy, lumbar region: Secondary | ICD-10-CM | POA: Diagnosis not present

## 2014-05-25 DIAGNOSIS — M549 Dorsalgia, unspecified: Secondary | ICD-10-CM | POA: Diagnosis not present

## 2014-05-25 DIAGNOSIS — B370 Candidal stomatitis: Secondary | ICD-10-CM | POA: Diagnosis not present

## 2014-05-25 DIAGNOSIS — M4316 Spondylolisthesis, lumbar region: Secondary | ICD-10-CM | POA: Diagnosis not present

## 2014-05-25 DIAGNOSIS — M5136 Other intervertebral disc degeneration, lumbar region: Secondary | ICD-10-CM | POA: Diagnosis not present

## 2014-06-02 DIAGNOSIS — M545 Low back pain: Secondary | ICD-10-CM | POA: Diagnosis not present

## 2014-06-03 DIAGNOSIS — M6281 Muscle weakness (generalized): Secondary | ICD-10-CM | POA: Diagnosis not present

## 2014-06-03 DIAGNOSIS — M545 Low back pain: Secondary | ICD-10-CM | POA: Diagnosis not present

## 2014-06-03 DIAGNOSIS — M2560 Stiffness of unspecified joint, not elsewhere classified: Secondary | ICD-10-CM | POA: Diagnosis not present

## 2014-06-04 DIAGNOSIS — M545 Low back pain: Secondary | ICD-10-CM | POA: Diagnosis not present

## 2014-06-04 DIAGNOSIS — M6281 Muscle weakness (generalized): Secondary | ICD-10-CM | POA: Diagnosis not present

## 2014-06-04 DIAGNOSIS — M2560 Stiffness of unspecified joint, not elsewhere classified: Secondary | ICD-10-CM | POA: Diagnosis not present

## 2014-06-08 DIAGNOSIS — M47816 Spondylosis without myelopathy or radiculopathy, lumbar region: Secondary | ICD-10-CM | POA: Diagnosis not present

## 2014-06-08 DIAGNOSIS — M2560 Stiffness of unspecified joint, not elsewhere classified: Secondary | ICD-10-CM | POA: Diagnosis not present

## 2014-06-08 DIAGNOSIS — M6281 Muscle weakness (generalized): Secondary | ICD-10-CM | POA: Diagnosis not present

## 2014-06-08 DIAGNOSIS — M545 Low back pain: Secondary | ICD-10-CM | POA: Diagnosis not present

## 2014-06-10 ENCOUNTER — Ambulatory Visit (INDEPENDENT_AMBULATORY_CARE_PROVIDER_SITE_OTHER): Payer: Medicare Other | Admitting: *Deleted

## 2014-06-10 DIAGNOSIS — I495 Sick sinus syndrome: Secondary | ICD-10-CM | POA: Diagnosis not present

## 2014-06-10 LAB — MDC_IDC_ENUM_SESS_TYPE_REMOTE
Battery Remaining Longevity: 85 mo
Battery Remaining Percentage: 74 %
Battery Voltage: 2.95 V
Brady Statistic AP VP Percent: 30 %
Brady Statistic AP VS Percent: 11 %
Brady Statistic AS VP Percent: 42 %
Brady Statistic AS VS Percent: 17 %
Brady Statistic RA Percent Paced: 40 %
Brady Statistic RV Percent Paced: 72 %
Date Time Interrogation Session: 20160120091625
Implantable Pulse Generator Model: 2210
Implantable Pulse Generator Serial Number: 7174903
Lead Channel Impedance Value: 310 Ohm
Lead Channel Impedance Value: 540 Ohm
Lead Channel Pacing Threshold Amplitude: 0.875 V
Lead Channel Pacing Threshold Amplitude: 1.125 V
Lead Channel Pacing Threshold Pulse Width: 0.4 ms
Lead Channel Pacing Threshold Pulse Width: 0.6 ms
Lead Channel Sensing Intrinsic Amplitude: 5 mV
Lead Channel Sensing Intrinsic Amplitude: 9.2 mV
Lead Channel Setting Pacing Amplitude: 1.375
Lead Channel Setting Pacing Amplitude: 1.875
Lead Channel Setting Pacing Pulse Width: 0.6 ms
Lead Channel Setting Sensing Sensitivity: 3 mV

## 2014-06-10 NOTE — Progress Notes (Signed)
Remote pacemaker transmission.   

## 2014-06-11 DIAGNOSIS — M545 Low back pain: Secondary | ICD-10-CM | POA: Diagnosis not present

## 2014-06-11 DIAGNOSIS — M2560 Stiffness of unspecified joint, not elsewhere classified: Secondary | ICD-10-CM | POA: Diagnosis not present

## 2014-06-11 DIAGNOSIS — M6281 Muscle weakness (generalized): Secondary | ICD-10-CM | POA: Diagnosis not present

## 2014-06-16 DIAGNOSIS — M79675 Pain in left toe(s): Secondary | ICD-10-CM | POA: Diagnosis not present

## 2014-06-16 DIAGNOSIS — B351 Tinea unguium: Secondary | ICD-10-CM | POA: Diagnosis not present

## 2014-06-16 DIAGNOSIS — M79674 Pain in right toe(s): Secondary | ICD-10-CM | POA: Diagnosis not present

## 2014-06-18 ENCOUNTER — Encounter: Payer: Self-pay | Admitting: Cardiology

## 2014-06-18 DIAGNOSIS — M6281 Muscle weakness (generalized): Secondary | ICD-10-CM | POA: Diagnosis not present

## 2014-06-18 DIAGNOSIS — M2560 Stiffness of unspecified joint, not elsewhere classified: Secondary | ICD-10-CM | POA: Diagnosis not present

## 2014-06-18 DIAGNOSIS — M545 Low back pain: Secondary | ICD-10-CM | POA: Diagnosis not present

## 2014-06-22 DIAGNOSIS — M2560 Stiffness of unspecified joint, not elsewhere classified: Secondary | ICD-10-CM | POA: Diagnosis not present

## 2014-06-22 DIAGNOSIS — M6281 Muscle weakness (generalized): Secondary | ICD-10-CM | POA: Diagnosis not present

## 2014-06-22 DIAGNOSIS — M545 Low back pain: Secondary | ICD-10-CM | POA: Diagnosis not present

## 2014-06-24 ENCOUNTER — Encounter: Payer: Self-pay | Admitting: Cardiovascular Disease

## 2014-06-25 DIAGNOSIS — M545 Low back pain: Secondary | ICD-10-CM | POA: Diagnosis not present

## 2014-06-25 DIAGNOSIS — M2560 Stiffness of unspecified joint, not elsewhere classified: Secondary | ICD-10-CM | POA: Diagnosis not present

## 2014-06-25 DIAGNOSIS — M6281 Muscle weakness (generalized): Secondary | ICD-10-CM | POA: Diagnosis not present

## 2014-06-29 DIAGNOSIS — M2560 Stiffness of unspecified joint, not elsewhere classified: Secondary | ICD-10-CM | POA: Diagnosis not present

## 2014-06-29 DIAGNOSIS — M545 Low back pain: Secondary | ICD-10-CM | POA: Diagnosis not present

## 2014-06-29 DIAGNOSIS — M6281 Muscle weakness (generalized): Secondary | ICD-10-CM | POA: Diagnosis not present

## 2014-06-30 DIAGNOSIS — M47816 Spondylosis without myelopathy or radiculopathy, lumbar region: Secondary | ICD-10-CM | POA: Diagnosis not present

## 2014-07-02 DIAGNOSIS — M6281 Muscle weakness (generalized): Secondary | ICD-10-CM | POA: Diagnosis not present

## 2014-07-02 DIAGNOSIS — M2560 Stiffness of unspecified joint, not elsewhere classified: Secondary | ICD-10-CM | POA: Diagnosis not present

## 2014-07-02 DIAGNOSIS — M545 Low back pain: Secondary | ICD-10-CM | POA: Diagnosis not present

## 2014-07-06 DIAGNOSIS — M47816 Spondylosis without myelopathy or radiculopathy, lumbar region: Secondary | ICD-10-CM | POA: Diagnosis not present

## 2014-07-07 DIAGNOSIS — M545 Low back pain: Secondary | ICD-10-CM | POA: Diagnosis not present

## 2014-07-07 DIAGNOSIS — M6281 Muscle weakness (generalized): Secondary | ICD-10-CM | POA: Diagnosis not present

## 2014-07-07 DIAGNOSIS — M2560 Stiffness of unspecified joint, not elsewhere classified: Secondary | ICD-10-CM | POA: Diagnosis not present

## 2014-07-08 DIAGNOSIS — E039 Hypothyroidism, unspecified: Secondary | ICD-10-CM | POA: Diagnosis not present

## 2014-07-08 DIAGNOSIS — Z6841 Body Mass Index (BMI) 40.0 and over, adult: Secondary | ICD-10-CM | POA: Diagnosis not present

## 2014-07-08 DIAGNOSIS — E1142 Type 2 diabetes mellitus with diabetic polyneuropathy: Secondary | ICD-10-CM | POA: Diagnosis not present

## 2014-07-08 DIAGNOSIS — N183 Chronic kidney disease, stage 3 (moderate): Secondary | ICD-10-CM | POA: Diagnosis not present

## 2014-07-08 DIAGNOSIS — Z23 Encounter for immunization: Secondary | ICD-10-CM | POA: Diagnosis not present

## 2014-07-09 DIAGNOSIS — M2560 Stiffness of unspecified joint, not elsewhere classified: Secondary | ICD-10-CM | POA: Diagnosis not present

## 2014-07-09 DIAGNOSIS — M545 Low back pain: Secondary | ICD-10-CM | POA: Diagnosis not present

## 2014-07-09 DIAGNOSIS — M6281 Muscle weakness (generalized): Secondary | ICD-10-CM | POA: Diagnosis not present

## 2014-07-14 ENCOUNTER — Other Ambulatory Visit: Payer: Self-pay | Admitting: Orthopedic Surgery

## 2014-07-14 ENCOUNTER — Encounter: Payer: Self-pay | Admitting: Cardiovascular Disease

## 2014-07-14 DIAGNOSIS — M545 Low back pain: Secondary | ICD-10-CM

## 2014-07-14 DIAGNOSIS — R1031 Right lower quadrant pain: Secondary | ICD-10-CM | POA: Diagnosis not present

## 2014-07-20 DIAGNOSIS — M6281 Muscle weakness (generalized): Secondary | ICD-10-CM | POA: Diagnosis not present

## 2014-07-20 DIAGNOSIS — M545 Low back pain: Secondary | ICD-10-CM | POA: Diagnosis not present

## 2014-07-20 DIAGNOSIS — M2560 Stiffness of unspecified joint, not elsewhere classified: Secondary | ICD-10-CM | POA: Diagnosis not present

## 2014-07-23 DIAGNOSIS — M47816 Spondylosis without myelopathy or radiculopathy, lumbar region: Secondary | ICD-10-CM | POA: Diagnosis not present

## 2014-07-30 DIAGNOSIS — M2041 Other hammer toe(s) (acquired), right foot: Secondary | ICD-10-CM | POA: Diagnosis not present

## 2014-07-30 DIAGNOSIS — B351 Tinea unguium: Secondary | ICD-10-CM | POA: Diagnosis not present

## 2014-07-30 DIAGNOSIS — E119 Type 2 diabetes mellitus without complications: Secondary | ICD-10-CM | POA: Diagnosis not present

## 2014-07-30 DIAGNOSIS — M79674 Pain in right toe(s): Secondary | ICD-10-CM | POA: Diagnosis not present

## 2014-08-13 ENCOUNTER — Other Ambulatory Visit: Payer: Self-pay | Admitting: Cardiovascular Disease

## 2014-08-13 NOTE — Telephone Encounter (Signed)
Rx(s) sent to pharmacy electronically.  

## 2014-08-17 DIAGNOSIS — L03031 Cellulitis of right toe: Secondary | ICD-10-CM | POA: Diagnosis not present

## 2014-08-17 DIAGNOSIS — E119 Type 2 diabetes mellitus without complications: Secondary | ICD-10-CM | POA: Diagnosis not present

## 2014-08-17 DIAGNOSIS — M79674 Pain in right toe(s): Secondary | ICD-10-CM | POA: Diagnosis not present

## 2014-08-24 ENCOUNTER — Other Ambulatory Visit: Payer: Self-pay | Admitting: Cardiovascular Disease

## 2014-08-25 NOTE — Telephone Encounter (Signed)
Rx refill sent to patient pharmacy   

## 2014-09-01 ENCOUNTER — Other Ambulatory Visit: Payer: Self-pay | Admitting: Cardiovascular Disease

## 2014-09-01 NOTE — Telephone Encounter (Signed)
Rx has been sent to the pharmacy electronically. ° °

## 2014-09-09 ENCOUNTER — Ambulatory Visit (INDEPENDENT_AMBULATORY_CARE_PROVIDER_SITE_OTHER): Payer: Medicare Other | Admitting: *Deleted

## 2014-09-09 DIAGNOSIS — I495 Sick sinus syndrome: Secondary | ICD-10-CM | POA: Diagnosis not present

## 2014-09-09 NOTE — Progress Notes (Signed)
Remote pacemaker transmission.   

## 2014-09-13 LAB — MDC_IDC_ENUM_SESS_TYPE_REMOTE
Battery Remaining Longevity: 76 mo
Battery Remaining Percentage: 67 %
Battery Voltage: 2.93 V
Brady Statistic AP VP Percent: 29 %
Brady Statistic AP VS Percent: 10 %
Brady Statistic AS VP Percent: 45 %
Brady Statistic AS VS Percent: 16 %
Brady Statistic RA Percent Paced: 38 %
Brady Statistic RV Percent Paced: 73 %
Date Time Interrogation Session: 20160420074701
Implantable Pulse Generator Model: 2210
Implantable Pulse Generator Serial Number: 7174903
Lead Channel Impedance Value: 300 Ohm
Lead Channel Impedance Value: 540 Ohm
Lead Channel Pacing Threshold Amplitude: 0.875 V
Lead Channel Pacing Threshold Amplitude: 1.125 V
Lead Channel Pacing Threshold Pulse Width: 0.4 ms
Lead Channel Pacing Threshold Pulse Width: 0.6 ms
Lead Channel Sensing Intrinsic Amplitude: 4.9 mV
Lead Channel Sensing Intrinsic Amplitude: 8.2 mV
Lead Channel Setting Pacing Amplitude: 1.375
Lead Channel Setting Pacing Amplitude: 1.875
Lead Channel Setting Pacing Pulse Width: 0.6 ms
Lead Channel Setting Sensing Sensitivity: 3 mV

## 2014-09-16 ENCOUNTER — Encounter: Payer: Self-pay | Admitting: Cardiology

## 2014-09-18 ENCOUNTER — Encounter: Payer: Self-pay | Admitting: Cardiovascular Disease

## 2014-11-03 ENCOUNTER — Other Ambulatory Visit: Payer: Self-pay | Admitting: Cardiovascular Disease

## 2014-11-04 ENCOUNTER — Ambulatory Visit (INDEPENDENT_AMBULATORY_CARE_PROVIDER_SITE_OTHER): Payer: Medicare Other | Admitting: Podiatry

## 2014-11-04 DIAGNOSIS — M79609 Pain in unspecified limb: Secondary | ICD-10-CM

## 2014-11-04 DIAGNOSIS — E1151 Type 2 diabetes mellitus with diabetic peripheral angiopathy without gangrene: Secondary | ICD-10-CM

## 2014-11-04 DIAGNOSIS — M79672 Pain in left foot: Secondary | ICD-10-CM | POA: Diagnosis not present

## 2014-11-04 DIAGNOSIS — B351 Tinea unguium: Secondary | ICD-10-CM

## 2014-11-04 DIAGNOSIS — E1140 Type 2 diabetes mellitus with diabetic neuropathy, unspecified: Secondary | ICD-10-CM

## 2014-11-04 NOTE — Progress Notes (Signed)
PatComplaint:  Visit Type: Patient returns to my office for continued preventative foot care services. Complaint: Patient states" my nails have grown long and thick and become painful to walk and wear shoes" Patient has been diagnosed with DM with no complications. He presents for preventative foot care services. No changes to ROS  Podiatric Exam: Vascular: dorsalis pedis and posterior tibial pulses are not  palpable bilateral. Capillary return is immediate. Temperature gradient is WNL. Skin turgor WNL  Sensorium: Diminished l Semmes Weinstein monofilament test. Normal tactile sensation bilaterally. Nail Exam: Pt has thick disfigured discolored nails with subungual debris noted bilateral entire nail hallux through fifth toenails Ulcer Exam: There is no evidence of ulcer or pre-ulcerative changes or infection. Orthopedic Exam: Muscle tone and strength are WNL. No limitations in general ROM. No crepitus or effusions noted. Foot type and digits show no abnormalities. Bony prominences are unremarkable. Skin: No Porokeratosis. No infection or ulcers  Diagnosis:  Tinea unguium, Pain in right toe, pain in left toes  Treatment & Plan Procedures and Treatment: Consent by patient was obtained for treatment procedures. The patient understood the discussion of treatment and procedures well. All questions were answered thoroughly reviewed. Debridement of mycotic and hypertrophic toenails, 1 through 5 bilateral and clearing of subungual debris. No ulceration, no infection noted.  Return Visit-Office Procedure: Patient instructed to return to the office for a follow up visit 3 months for continued evaluation and treatment.ient ID: [name], male   DOB: May 16, 1932, 81 y.o.   MRN: [id number]

## 2014-12-09 ENCOUNTER — Encounter: Payer: Self-pay | Admitting: Cardiovascular Disease

## 2014-12-09 ENCOUNTER — Ambulatory Visit (INDEPENDENT_AMBULATORY_CARE_PROVIDER_SITE_OTHER): Payer: Medicare Other | Admitting: *Deleted

## 2014-12-09 DIAGNOSIS — I495 Sick sinus syndrome: Secondary | ICD-10-CM | POA: Diagnosis not present

## 2014-12-09 NOTE — Progress Notes (Signed)
Remote pacemaker transmission.   

## 2014-12-18 LAB — CUP PACEART REMOTE DEVICE CHECK
Battery Remaining Longevity: 92 mo
Battery Remaining Percentage: 81 %
Battery Voltage: 2.93 V
Brady Statistic AP VP Percent: 26 %
Brady Statistic AP VS Percent: 11 %
Brady Statistic AS VP Percent: 42 %
Brady Statistic AS VS Percent: 20 %
Brady Statistic RA Percent Paced: 36 %
Brady Statistic RV Percent Paced: 68 %
Date Time Interrogation Session: 20160720082155
Lead Channel Impedance Value: 310 Ohm
Lead Channel Impedance Value: 540 Ohm
Lead Channel Pacing Threshold Amplitude: 0.875 V
Lead Channel Pacing Threshold Amplitude: 1 V
Lead Channel Pacing Threshold Pulse Width: 0.4 ms
Lead Channel Pacing Threshold Pulse Width: 0.6 ms
Lead Channel Sensing Intrinsic Amplitude: 4.9 mV
Lead Channel Sensing Intrinsic Amplitude: 8.7 mV
Lead Channel Setting Pacing Amplitude: 1.25 V
Lead Channel Setting Pacing Amplitude: 1.875
Lead Channel Setting Pacing Pulse Width: 0.6 ms
Lead Channel Setting Sensing Sensitivity: 3 mV
Pulse Gen Model: 2210
Pulse Gen Serial Number: 7174903

## 2014-12-30 ENCOUNTER — Encounter: Payer: Self-pay | Admitting: Cardiology

## 2015-01-06 DIAGNOSIS — N183 Chronic kidney disease, stage 3 (moderate): Secondary | ICD-10-CM | POA: Diagnosis not present

## 2015-01-06 DIAGNOSIS — Z6841 Body Mass Index (BMI) 40.0 and over, adult: Secondary | ICD-10-CM | POA: Diagnosis not present

## 2015-01-06 DIAGNOSIS — E1142 Type 2 diabetes mellitus with diabetic polyneuropathy: Secondary | ICD-10-CM | POA: Diagnosis not present

## 2015-01-06 DIAGNOSIS — Z794 Long term (current) use of insulin: Secondary | ICD-10-CM | POA: Diagnosis not present

## 2015-01-06 DIAGNOSIS — E039 Hypothyroidism, unspecified: Secondary | ICD-10-CM | POA: Diagnosis not present

## 2015-01-06 DIAGNOSIS — I10 Essential (primary) hypertension: Secondary | ICD-10-CM | POA: Diagnosis not present

## 2015-01-07 ENCOUNTER — Other Ambulatory Visit: Payer: Self-pay | Admitting: Cardiovascular Disease

## 2015-01-08 NOTE — Telephone Encounter (Signed)
Rx(s) sent to pharmacy electronically.  

## 2015-02-01 DIAGNOSIS — Z8546 Personal history of malignant neoplasm of prostate: Secondary | ICD-10-CM | POA: Diagnosis not present

## 2015-02-03 ENCOUNTER — Ambulatory Visit (INDEPENDENT_AMBULATORY_CARE_PROVIDER_SITE_OTHER): Payer: Medicare Other | Admitting: Podiatry

## 2015-02-03 ENCOUNTER — Encounter: Payer: Self-pay | Admitting: Podiatry

## 2015-02-03 DIAGNOSIS — M79609 Pain in unspecified limb: Principal | ICD-10-CM

## 2015-02-03 DIAGNOSIS — M79673 Pain in unspecified foot: Secondary | ICD-10-CM

## 2015-02-03 DIAGNOSIS — B351 Tinea unguium: Secondary | ICD-10-CM

## 2015-02-03 DIAGNOSIS — E1151 Type 2 diabetes mellitus with diabetic peripheral angiopathy without gangrene: Secondary | ICD-10-CM

## 2015-02-03 NOTE — Progress Notes (Signed)
PatComplaint:  Visit Type: Patient returns to my office for continued preventative foot care services. Complaint: Patient states" my nails have grown long and thick and become painful to walk and wear shoes" Patient has been diagnosed with DM with no complications. He presents for preventative foot care services. No changes to ROS  Podiatric Exam: Vascular: dorsalis pedis and posterior tibial pulses are not  palpable bilateral. Capillary return is immediate. Temperature gradient is WNL. Skin turgor WNL  Sensorium: Diminished l Semmes Weinstein monofilament test. Normal tactile sensation bilaterally. Nail Exam: Pt has thick disfigured discolored nails with subungual debris noted bilateral entire nail hallux through fifth toenails Ulcer Exam: There is no evidence of ulcer or pre-ulcerative changes or infection. Orthopedic Exam: Muscle tone and strength are WNL. No limitations in general ROM. No crepitus or effusions noted. Foot type and digits show no abnormalities. Bony prominences are unremarkable. Skin: No Porokeratosis. No infection or ulcers  Diagnosis:  Tinea unguium, Pain in right toe, pain in left toes  Treatment & Plan Procedures and Treatment: Consent by patient was obtained for treatment procedures. The patient understood the discussion of treatment and procedures well. All questions were answered thoroughly reviewed. Debridement of mycotic and hypertrophic toenails, 1 through 5 bilateral and clearing of subungual debris. No ulceration, no infection noted.  Return Visit-Office Procedure: Patient instructed to return to the office for a follow up visit 3 months for continued evaluation and treatment.ient ID: [name], male   DOB: 02-17-1932, 81 y.o.   MRN: [id number]

## 2015-02-07 ENCOUNTER — Other Ambulatory Visit: Payer: Self-pay | Admitting: Cardiovascular Disease

## 2015-02-08 NOTE — Telephone Encounter (Signed)
Rx request sent to pharmacy.  

## 2015-02-10 DIAGNOSIS — L821 Other seborrheic keratosis: Secondary | ICD-10-CM | POA: Diagnosis not present

## 2015-02-10 DIAGNOSIS — L570 Actinic keratosis: Secondary | ICD-10-CM | POA: Diagnosis not present

## 2015-02-18 ENCOUNTER — Encounter: Payer: Self-pay | Admitting: Cardiovascular Disease

## 2015-02-18 ENCOUNTER — Ambulatory Visit (INDEPENDENT_AMBULATORY_CARE_PROVIDER_SITE_OTHER): Payer: Medicare Other | Admitting: Cardiovascular Disease

## 2015-02-18 VITALS — BP 128/64 | HR 77 | Resp 16 | Ht 67.0 in | Wt 270.0 lb

## 2015-02-18 DIAGNOSIS — I2510 Atherosclerotic heart disease of native coronary artery without angina pectoris: Secondary | ICD-10-CM

## 2015-02-18 DIAGNOSIS — N183 Chronic kidney disease, stage 3 unspecified: Secondary | ICD-10-CM

## 2015-02-18 DIAGNOSIS — E782 Mixed hyperlipidemia: Secondary | ICD-10-CM | POA: Diagnosis not present

## 2015-02-18 DIAGNOSIS — I441 Atrioventricular block, second degree: Secondary | ICD-10-CM

## 2015-02-18 DIAGNOSIS — E785 Hyperlipidemia, unspecified: Secondary | ICD-10-CM

## 2015-02-18 DIAGNOSIS — I495 Sick sinus syndrome: Secondary | ICD-10-CM | POA: Diagnosis not present

## 2015-02-18 DIAGNOSIS — I5042 Chronic combined systolic (congestive) and diastolic (congestive) heart failure: Secondary | ICD-10-CM

## 2015-02-18 DIAGNOSIS — Z79899 Other long term (current) drug therapy: Secondary | ICD-10-CM

## 2015-02-18 DIAGNOSIS — G4733 Obstructive sleep apnea (adult) (pediatric): Secondary | ICD-10-CM

## 2015-02-18 DIAGNOSIS — I255 Ischemic cardiomyopathy: Secondary | ICD-10-CM | POA: Diagnosis not present

## 2015-02-18 DIAGNOSIS — Z950 Presence of cardiac pacemaker: Secondary | ICD-10-CM

## 2015-02-18 NOTE — Progress Notes (Signed)
Patient ID: [name], male   DOB: 1931/12/21, 81 y.o.   MRN: [id number]     Cardiology Office Note   Date:  02/19/2015   ID:  [name], DOB [birth year], MRN [id number]  PCP:  Lujean Amel, MD  Cardiologist:   Sanda Klein, MD   Chief Complaint  Patient presents with  . Apollo  . Dizziness  . Shortness of Breath  . Edema    ANKLES      History of Present Illness: [name] is a 81 y.o. male who presents for  Pacemaker follow-up as well as coronary artery disease with mildly reduced left ventricular systolic function.   He has not had any new major health problems since his last appointment. He inadvertently stopped his statin for a couple of days and noticed a marked reduction in generalized myalgia and arthralgia. He had much more energy. When he restarted the statin his symptoms recurred. He has insulin requiring diabetes mellitus as well as known coronary disease.  He denies shortness of breath or angina with activity and has not expressed syncope, palpitations, lower extremity edema or intermittent claudication. He does take a small dose of loop diuretic.  Interrogation of his dual-chamber pacemaker shows normal function with an estimated generator longevity of 7. 5-8 years, 36% atrial pacing, 63% ventricular pacing rare episodes of paroxysmal atrial tachycardia (the longest lasting only 52 seconds) , no ventricular tachycardia and no atrial fibrillation. The presenting rhythm today was atrial paced ventricular sensed with left bundle branch block.  He has a dual-chamber permanent pacemaker that was initially implanted in 1990 for symptomatic bradycardia, with subsequent generator change outs in 2002 and 2011. His initial leads are still operating with excellent parameters. He is enrolled in remote monitoring using the [employer] system.   Additional problems include insulin-requiring diabetes mellitus, hyperlipidemia and hypertension, all of which are well  controlled. His most recent nuclear study in January 2014 showed an ejection of 34% with a fixed inferior defect and no reversible ischemia. I'm not sure about the accuracy of left ventricular ejection fraction assessment. His most recent echocardiogram performed around the same date showed a mildly depressed LVEF of 45-50% (note the presence of right ventricular pacing which may alter the accuracy of EF assessment). 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  Past Medical History  Diagnosis Date  . Pacemaker 2011    St. Jude Accent DDDR ; this is the third generator this pt. has had  . Diabetes mellitus without complication   . Cancer   . Shingles   . GI bleed   . Gastric ulcer   . UTI (lower urinary tract infection)   . Blepharitis   . Hx of echocardiogram 08-15-2012    EF  45-50 % very difficult study, severe LVH, pacemaker, trace TR,mildly enlarged RV,; aorta mildly calcified  . History of stress test 06/06/2012    low risk scan. fixed inferior defect consistant with prior infarction with moderatly reduced EF. no appreciable evidence of ischemia. no significant change from previous study  . Abnormal Doppler ultrasound of carotid artery 12/16/2012    mildly abnormal doppler . no prior studies to compare to. follow up studies are recommended when clinically indicated.  Marland Kitchen  Exogenous obesity     Past Surgical History  Procedure Laterality Date  . Knee surgery Right   . Spine surgery    . Prostate biopsy    . Prostate seed implants    . Cardiac stents    . Wisdom tooth extraction    . Cataract extraction    . Cardiac catheterization  12-29-2003    rt. coronary angiography was done with a 6 french 4  cm taper coronary cath. This demonstrated  approximately 70 to 75% mildly segmental stenosis in the mid RCA before the acute margin          . Cardiac catheterization  11-19-2003    diagnostic cath,, pt. to get second cath to have stent placed     Current Outpatient Prescriptions  Medication Sig Dispense Refill  . aspirin 81 MG tablet Take 81 mg by mouth daily.    . cholecalciferol (VITAMIN D) 1000 UNITS tablet Take 2,000 Units by mouth daily.    . clopidogrel (PLAVIX) 75 MG tablet TAKE 1 TABLET BY MOUTH DAILY 30 tablet 6  . Coenzyme Q10 (COQ-10) 150 MG CAPS Take 1 capsule by mouth daily.     . CRESTOR 40 MG tablet TAKE 1 TABLET BY MOUTH DAILY 30 tablet 6  . furosemide (LASIX) 20 MG tablet TAKE 1 TABLET BY MOUTH DAILY ALTERNATING WITH 2 TABLETS AS DIRECTED 60 tablet 0  . insulin NPH-regular Human (NOVOLIN 70/30) (70-30) 100 UNIT/ML injection Inject 60 Units into the skin.    Marland Kitchen levothyroxine (SYNTHROID, LEVOTHROID) 25 MCG tablet     . losartan (COZAAR) 100 MG tablet take 1 tablet by mouth once daily 30 tablet 6  . metoprolol succinate (TOPROL-XL) 25 MG 24 hr tablet Take 1.5 tablets (37.5 mg total) by mouth daily. 45 tablet 7  . nitroGLYCERIN (NITROSTAT) 0.4 MG SL tablet Place 1 tablet (0.4 mg total) under the tongue every 5 (five) minutes as needed for chest pain. 25 tablet 3  . Omega-3 Fatty Acids (FISH OIL) 1000 MG CAPS Take 2,000 mg by mouth daily.    . pantoprazole (PROTONIX) 40 MG tablet take 1 tablet by mouth once daily 30 tablet 6  . potassium chloride (K-DUR) 10 MEQ tablet Take 1-2 tablets (10-20 mEq total) by mouth daily. 60 tablet 2  . vitamin C (ASCORBIC ACID) 500 MG tablet Take 500 mg by mouth daily.    . vitamin E 400 UNIT capsule Take 400 Units by mouth daily.     No current facility-administered medications for this visit.    Allergies:   Review of patient's allergies indicates no known allergies.    Social History:  The patient  reports that he has never smoked. He does  not have any smokeless tobacco history on file. He reports that he drinks alcohol. He reports that he does not use illicit drugs.    ROS:  Please see the history of present illness.    Otherwise, review of systems positive for none.   All other systems are reviewed and negative.    PHYSICAL EXAM: VS:  BP 128/64 mmHg  Pulse 77  Resp 16  Ht 5\' 7"  (1.702 m)  Wt 270 lb (122.471 kg)  BMI 42.28 kg/m2 , BMI Body mass index is 42.28 kg/(m^2).  General: Alert, oriented x3, no distress Head: no evidence of trauma, PERRL, EOMI, no exophtalmos or lid lag, no myxedema, no xanthelasma; normal ears, nose and oropharynx Neck: normal jugular venous pulsations and no hepatojugular reflux; brisk carotid pulses without  delay and no carotid bruits Chest: clear to auscultation, no signs of consolidation by percussion or palpation, normal fremitus, symmetrical and full respiratory excursions,  Healthy left subclavian pacemaker site Cardiovascular: normal position and quality of the apical impulse, regular rhythm, normal first and  Paradoxically splitsecond heart sounds, no murmurs, rubs or gallops Abdomen: no tenderness or distention, no masses by palpation, no abnormal pulsatility or arterial bruits, normal bowel sounds, no hepatosplenomegaly Extremities: no clubbing, cyanosis or edema; 2+ radial, ulnar and brachial pulses bilaterally; 2+ right femoral, posterior tibial and dorsalis pedis pulses; 2+ left femoral, posterior tibial and dorsalis pedis pulses; no subclavian or femoral bruits Neurological: grossly nonfocal Psych: euthymic mood, full affect   EKG:  EKG is ordered today. The ekg ordered today demonstrates  Atrial paced ventricular sensed, left bundle branch block   Recent Labs: No results found for requested labs within last 365 days.    Lipid Panel No results found for: CHOL, TRIG, HDL, CHOLHDL, VLDL, LDLCALC, LDLDIRECT    Wt Readings from Last 3 Encounters:  02/18/15 270 lb (122.471 kg)   03/09/14 266 lb 1.6 oz (120.702 kg)  07/24/13 261 lb 14.4 oz (118.797 kg)    .   ASSESSMENT AND PLAN:  1.  Sinus bradycardia and second-degree AV block with normally functioning dual-chamber permanent pacemaker. Continue remote monitoring every 3 months and yearly office visits.  He has a relatively high burden of ventricular pacing. So far, this does not appear to have negative impact on heart failure. May need to consider CRT in the future.  2. Hyperlipidemia in the setting of known vascular disease and diabetes mellitus. Target LDL cholesterol less than 70.  Myalgias seem to improve off statins. He is oriented taking coenzyme Q 10. We'll try a Crestor holiday of one month and then reevaluate our options. He may need to try to switch to PCS K9 inhibitor.  3..  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 He is on ARB and beta blocker.  4. OSA intolerant of CPAP   Current medicines are reviewed at length with the patient today.  The patient has concerns regarding medicines.  The following changes have been made:  Statin holiday  Labs/ tests ordered today include:   Orders Placed This Encounter  Procedures  . Comprehensive metabolic panel  . Lipid panel  . EKG 12-Lead    Patient Instructions  Your physician has recommended you make the following change in your medication:   [place].  Your physician recommends that you return for lab work in:   [facility]  Remote monitoring is used to monitor your Pacemaker from home. This monitoring reduces the number of office visits required to check your device to one time per year. It allows [place] to monitor the functioning of your device to ensure it is working properly. You are scheduled for a device check from home on May 21, 2015.  Dr. Sallyanne Kuster recommends that you schedule a follow-up appointment in: ONE  YEAR.        Mikael Spray, MD  02/19/2015 2:54 PM    Sanda Klein, MD, [facility] [phone] office [phone] pager

## 2015-02-18 NOTE — Patient Instructions (Addendum)
Your physician has recommended you make the following change in your medication:   [place].  Your physician recommends that you return for lab work in:   [facility]  Remote monitoring is used to monitor your Pacemaker from home. This monitoring reduces the number of office visits required to check your device to one time per year. It allows [place] to monitor the functioning of your device to ensure it is working properly. You are scheduled for a device check from home on May 21, 2015.  Dr. Sallyanne Kuster recommends that you schedule a follow-up appointment in: ONE YEAR.

## 2015-02-19 ENCOUNTER — Encounter: Payer: Self-pay | Admitting: Cardiovascular Disease

## 2015-02-19 DIAGNOSIS — I5042 Chronic combined systolic (congestive) and diastolic (congestive) heart failure: Secondary | ICD-10-CM

## 2015-02-19 HISTORY — DX: Chronic combined systolic (congestive) and diastolic (congestive) heart failure: I50.42

## 2015-03-02 LAB — CUP PACEART INCLINIC DEVICE CHECK
Battery Remaining Percentage: 81 %
Battery Voltage: 2.93 V
Brady Statistic RA Percent Paced: 36 %
Brady Statistic RV Percent Paced: 63 %
Date Time Interrogation Session: 20161011090754
Lead Channel Impedance Value: 300 Ohm
Lead Channel Impedance Value: 540 Ohm
Lead Channel Pacing Threshold Amplitude: 1 V
Lead Channel Pacing Threshold Amplitude: 1 V
Lead Channel Pacing Threshold Pulse Width: 0.4 ms
Lead Channel Pacing Threshold Pulse Width: 0.6 ms
Lead Channel Sensing Intrinsic Amplitude: 5 mV
Lead Channel Sensing Intrinsic Amplitude: 8.7 mV
Lead Channel Setting Pacing Amplitude: 1.25 V
Lead Channel Setting Pacing Amplitude: 1.875
Lead Channel Setting Pacing Pulse Width: 0.6 ms
Lead Channel Setting Sensing Sensitivity: 3 mV
Pulse Gen Model: 2210
Pulse Gen Serial Number: 7174903

## 2015-03-15 ENCOUNTER — Other Ambulatory Visit: Payer: Self-pay | Admitting: Cardiovascular Disease

## 2015-03-22 ENCOUNTER — Other Ambulatory Visit: Payer: Self-pay | Admitting: Cardiovascular Disease

## 2015-04-01 DIAGNOSIS — E785 Hyperlipidemia, unspecified: Secondary | ICD-10-CM | POA: Diagnosis not present

## 2015-04-01 DIAGNOSIS — Z79899 Other long term (current) drug therapy: Secondary | ICD-10-CM | POA: Diagnosis not present

## 2015-04-02 LAB — LIPID PANEL
Cholesterol: 322 mg/dL — ABNORMAL HIGH (ref 125–200)
HDL: 26 mg/dL — ABNORMAL LOW (ref >=40)
Total CHOL/HDL Ratio: 12.4 Ratio — ABNORMAL HIGH (ref <=5.0)
Triglycerides: 501 mg/dL — ABNORMAL HIGH (ref <150)

## 2015-04-02 LAB — COMPREHENSIVE METABOLIC PANEL
ALT: 18 U/L (ref 9–46)
AST: 19 U/L (ref 10–35)
Albumin: 3.8 g/dL (ref 3.6–5.1)
Alkaline Phosphatase: 59 U/L (ref 40–115)
BUN: 38 mg/dL — ABNORMAL HIGH (ref 7–25)
CO2: 30 mmol/L (ref 20–31)
Calcium: 9.2 mg/dL (ref 8.6–10.3)
Chloride: 100 mmol/L (ref 98–110)
Creat: 2.01 mg/dL — ABNORMAL HIGH (ref 0.70–1.11)
Glucose, Bld: 111 mg/dL — ABNORMAL HIGH (ref 65–99)
Potassium: 4.7 mmol/L (ref 3.5–5.3)
Sodium: 137 mmol/L (ref 135–146)
Total Bilirubin: 0.7 mg/dL (ref 0.2–1.2)
Total Protein: 6.6 g/dL (ref 6.1–8.1)

## 2015-04-02 LAB — LDL CHOLESTEROL, DIRECT: Direct LDL: 138 mg/dL — ABNORMAL HIGH (ref <130)

## 2015-04-03 ENCOUNTER — Emergency Department (HOSPITAL_COMMUNITY)
Admission: EM | Admit: 2015-04-03 | Discharge: 2015-04-03 | Disposition: A | Discharge status: Home / Self Care | Payer: Medicare Other | Attending: Emergency Medicine | Admitting: Emergency Medicine

## 2015-04-03 ENCOUNTER — Encounter (HOSPITAL_COMMUNITY): Payer: Self-pay

## 2015-04-03 DIAGNOSIS — Z9889 Other specified postprocedural states: Secondary | ICD-10-CM | POA: Diagnosis not present

## 2015-04-03 DIAGNOSIS — Z7902 Long term (current) use of antithrombotics/antiplatelets: Secondary | ICD-10-CM | POA: Insufficient documentation

## 2015-04-03 DIAGNOSIS — Z950 Presence of cardiac pacemaker: Secondary | ICD-10-CM | POA: Diagnosis not present

## 2015-04-03 DIAGNOSIS — Z794 Long term (current) use of insulin: Secondary | ICD-10-CM | POA: Insufficient documentation

## 2015-04-03 DIAGNOSIS — Z8619 Personal history of other infectious and parasitic diseases: Secondary | ICD-10-CM | POA: Diagnosis not present

## 2015-04-03 DIAGNOSIS — Z859 Personal history of malignant neoplasm, unspecified: Secondary | ICD-10-CM | POA: Diagnosis not present

## 2015-04-03 DIAGNOSIS — K5900 Constipation, unspecified: Secondary | ICD-10-CM | POA: Diagnosis not present

## 2015-04-03 DIAGNOSIS — E119 Type 2 diabetes mellitus without complications: Secondary | ICD-10-CM | POA: Insufficient documentation

## 2015-04-03 DIAGNOSIS — Z87440 Personal history of urinary (tract) infections: Secondary | ICD-10-CM | POA: Diagnosis not present

## 2015-04-03 DIAGNOSIS — Z79899 Other long term (current) drug therapy: Secondary | ICD-10-CM | POA: Diagnosis not present

## 2015-04-03 DIAGNOSIS — Z7982 Long term (current) use of aspirin: Secondary | ICD-10-CM | POA: Diagnosis not present

## 2015-04-03 DIAGNOSIS — E669 Obesity, unspecified: Secondary | ICD-10-CM | POA: Diagnosis not present

## 2015-04-03 DIAGNOSIS — Z8669 Personal history of other diseases of the nervous system and sense organs: Secondary | ICD-10-CM | POA: Insufficient documentation

## 2015-04-03 LAB — CBC
HCT: 48.8 % (ref 39.0–52.0)
Hemoglobin: 16.1 g/dL (ref 13.0–17.0)
MCH: 29 pg (ref 26.0–34.0)
MCHC: 33 g/dL (ref 30.0–36.0)
MCV: 87.8 fL (ref 78.0–100.0)
Platelets: 212 10*3/uL (ref 150–400)
RBC: 5.56 MIL/uL (ref 4.22–5.81)
RDW: 14.9 % (ref 11.5–15.5)
WBC: 7.8 10*3/uL (ref 4.0–10.5)

## 2015-04-03 NOTE — ED Notes (Signed)
Pt reports he just now had a large bowel movement.

## 2015-04-03 NOTE — Discharge Instructions (Signed)
Take MiraLAX 8 scoops in 32 ounces of drink of your choice. Use a fleets enema.  Constipation, Adult Constipation is when a person has fewer than three bowel movements a week, has difficulty having a bowel movement, or has stools that are dry, hard, or larger than normal. As people grow older, constipation is more common. A low-fiber diet, not taking in enough fluids, and taking certain medicines may make constipation worse.  CAUSES   Certain medicines, such as antidepressants, pain medicine, iron supplements, antacids, and water pills.   Certain diseases, such as diabetes, irritable bowel syndrome (IBS), thyroid disease, or depression.   Not drinking enough water.   Not eating enough fiber-rich foods.   Stress or travel.   Lack of physical activity or exercise.   Ignoring the urge to have a bowel movement.   Using laxatives too much.  SIGNS AND SYMPTOMS   Having fewer than three bowel movements a week.   Straining to have a bowel movement.   Having stools that are hard, dry, or larger than normal.   Feeling full or bloated.   Pain in the lower abdomen.   Not feeling relief after having a bowel movement.  DIAGNOSIS  Your health care provider will take a medical history and perform a physical exam. Further testing may be done for severe constipation. Some tests may include:  A barium enema X-ray to examine your rectum, colon, and, sometimes, your small intestine.   A sigmoidoscopy to examine your lower colon.   A colonoscopy to examine your entire colon. TREATMENT  Treatment will depend on the severity of your constipation and what is causing it. Some dietary treatments include drinking more fluids and eating more fiber-rich foods. Lifestyle treatments may include regular exercise. If these diet and lifestyle recommendations do not help, your health care provider may recommend taking over-the-counter laxative medicines to help you have bowel movements.  Prescription medicines may be prescribed if over-the-counter medicines do not work.  HOME CARE INSTRUCTIONS   Eat foods that have a lot of fiber, such as fruits, vegetables, whole grains, and beans.  Limit foods high in fat and processed sugars, such as french fries, hamburgers, cookies, candies, and soda.   A fiber supplement may be added to your diet if you cannot get enough fiber from foods.   Drink enough fluids to keep your urine clear or pale yellow.   Exercise regularly or as directed by your health care provider.   Go to the restroom when you have the urge to go. Do not hold it.   Only take over-the-counter or prescription medicines as directed by your health care provider. Do not take other medicines for constipation without talking to your health care provider first.  [place] IF:   You have bright red blood in your stool.   Your constipation lasts for more than 4 days or gets worse.   You have abdominal or rectal pain.   You have thin, pencil-like stools.   You have unexplained weight loss. MAKE SURE YOU:   Understand these instructions.  Will watch your condition.  Will get help right away if you are not doing well or get worse.   This information is not intended to replace advice given to you by your health care provider. Make sure you discuss any questions you have with your health care provider.   Document Released: 02/04/2004 Document Revised: 05/29/2014 Document Reviewed: 02/17/2013 Elsevier Interactive Patient Education [employer].

## 2015-04-03 NOTE — ED Notes (Signed)
Pt escorted to discharge window. Pt verbalized understanding discharge instructions. In no acute distress.  

## 2015-04-03 NOTE — ED Provider Notes (Signed)
CSN: [id number]     Arrival date & time 04/03/15  1601 History   First MD Initiated Contact with Patient 04/03/15 1742     Chief Complaint  Patient presents with  . Fecal Impaction     (Consider location/radiation/quality/duration/timing/severity/associated sxs/prior Treatment) Patient is a 81 y.o. male presenting with constipation. The history is provided by the patient.  Constipation Severity:  Moderate Time since last bowel movement:  3 days Timing:  Constant Progression:  Worsening Chronicity:  Recurrent Stool description:  None produced Relieved by:  Nothing Worsened by:  Nothing tried Ineffective treatments:  None tried Associated symptoms: abdominal pain   Associated symptoms: no diarrhea, no fever, no nausea and no vomiting    81 yo M with a chief complaint of constipation. This been going on for 3 days. Patient has a history of being severely impacted in the past. Patient was hospitalized after having some bowel ischemia after having prolonged constipation. This time patient is not having very significant abdominal pain. Denies any nausea or vomiting. Patient is concerned that he doesn't want to bear down to hard to cause an issue like previous. Patient has been trying over-the-counter remedies for his constipation. States that they have been working for quite some time and has not wanted to go back on the MiraLAX causes a cousin have diarrhea. Patient denies fevers or chills.  Past Medical History  Diagnosis Date  . Pacemaker 2011    St. Jude Accent DDDR ; this is the third generator this pt. has had  . Diabetes mellitus without complication ([place])   . Cancer ([place])   . Shingles   . GI bleed   . Gastric ulcer   . UTI (lower urinary tract infection)   . Blepharitis   . Hx of echocardiogram 08-15-2012    EF  45-50 % very difficult study, severe LVH, pacemaker, trace TR,mildly enlarged RV,; aorta mildly calcified  . History of stress test 06/06/2012    low risk scan. fixed  inferior defect consistant with prior infarction with moderatly reduced EF. no appreciable evidence of ischemia. no significant change from previous study  . Abnormal Doppler ultrasound of carotid artery 12/16/2012    mildly abnormal doppler . no prior studies to compare to. follow up studies are recommended when clinically indicated.  . Exogenous obesity    Past Surgical History  Procedure Laterality Date  . Knee surgery Right   . Spine surgery    . Prostate biopsy    . Prostate seed implants    . Cardiac stents    . Wisdom tooth extraction    . Cataract extraction    . Cardiac catheterization  12-29-2003    rt. coronary angiography was done with a 6 french 4 cm taper coronary cath. This demonstrated  approximately 70 to 75% mildly segmental stenosis in the mid RCA before the acute margin          . Cardiac catheterization  11-19-2003    diagnostic cath,, pt. to get second cath to have stent placed   No family history on file. Social History  Substance Use Topics  . Smoking status: Never Smoker   . Smokeless tobacco: None  . Alcohol Use: Yes     Comment: daily wine    Review of Systems  Constitutional: Negative for fever and chills.  HENT: Negative for congestion and facial swelling.   Eyes: Negative for discharge and visual disturbance.  Respiratory: Negative for shortness of breath.   Cardiovascular: Negative for chest  pain and palpitations.  Gastrointestinal: Positive for abdominal pain and constipation. Negative for nausea, vomiting and diarrhea.  Musculoskeletal: Negative for myalgias and arthralgias.  Skin: Negative for color change and rash.  Neurological: Negative for tremors, syncope and headaches.  Psychiatric/Behavioral: Negative for confusion and dysphoric mood.      Allergies  Review of patient's allergies indicates no known allergies.  Home Medications   Prior to Admission medications   Medication Sig Start Date End Date Taking? Authorizing Provider   aspirin 81 MG tablet Take 81 mg by mouth daily.   Yes Historical Provider, MD  cholecalciferol (VITAMIN D) 1000 UNITS tablet Take 2,000 Units by mouth daily.   Yes Historical Provider, MD  clopidogrel (PLAVIX) 75 MG tablet TAKE 1 TABLET BY MOUTH DAILY 08/25/14  Yes Mihai Croitoru, MD  Coenzyme Q10 (COQ-10) 150 MG CAPS Take 1 capsule by mouth daily.    Yes Historical Provider, MD  CRESTOR 40 MG tablet TAKE 1 TABLET BY MOUTH DAILY 09/01/14  Yes Mihai Croitoru, MD  furosemide (LASIX) 20 MG tablet take 1 tablet by mouth once daily ALTERNATING WITH 2 tablets as directed 03/23/15  Yes Mihai Croitoru, MD  insulin NPH-regular Human (NOVOLIN 70/30) (70-30) 100 UNIT/ML injection Inject 55 Units into the skin 2 (two) times daily with a meal.    Yes Historical Provider, MD  levothyroxine (SYNTHROID, LEVOTHROID) 25 MCG tablet  02/09/14  Yes Historical Provider, MD  losartan (COZAAR) 100 MG tablet take 1 tablet by mouth once daily 11/03/14  Yes Mihai Croitoru, MD  metoprolol succinate (TOPROL-XL) 25 MG 24 hr tablet Take 1.5 tablets (37.5 mg total) by mouth daily. 08/13/14  Yes Mihai Croitoru, MD  NITROSTAT 0.4 MG SL tablet PLACE 1 TABLET UNDER THE TONGUE EVERY 5 MINUTES AS NEEDED FOR CHEST PAIN 03/16/15  Yes Mihai Croitoru, MD  Omega-3 Fatty Acids (FISH OIL) 1000 MG CAPS Take 2,000 mg by mouth daily.   Yes Historical Provider, MD  pantoprazole (PROTONIX) 40 MG tablet take 1 tablet by mouth once daily 08/25/14  Yes Mihai Croitoru, MD  potassium chloride (K-DUR) 10 MEQ tablet Take 1-2 tablets (10-20 mEq total) by mouth daily. Patient taking differently: Take 10-20 mEq by mouth daily. Alternates between taking 1 tablet and two tablets 01/08/15  Yes Mihai Croitoru, MD  vitamin C (ASCORBIC ACID) 500 MG tablet Take 500 mg by mouth daily.   Yes Historical Provider, MD  vitamin E 400 UNIT capsule Take 400 Units by mouth daily.   Yes Historical Provider, MD   BP 170/74 mmHg  Pulse 86  Temp(Src) 97.6 F (36.4 C) (Oral)  Resp  16  SpO2 96% Physical Exam  Constitutional: He is oriented to person, place, and time. He appears well-developed and well-nourished.  HENT:  Head: Normocephalic and atraumatic.  Eyes: EOM are normal. Pupils are equal, round, and reactive to light.  Neck: Normal range of motion. Neck supple. No JVD present.  Cardiovascular: Normal rate and regular rhythm.  Exam reveals no gallop and no friction rub.   No murmur heard. Pulmonary/Chest: No respiratory distress. He has no wheezes.  Abdominal: He exhibits no distension. There is no rebound and no guarding.  Genitourinary:  Hard brown stool in the vault, barely reachable with my finger. Able to clear out what was able.  Musculoskeletal: Normal range of motion.  Neurological: He is alert and oriented to person, place, and time.  Skin: No rash noted. No pallor.  Psychiatric: He has a normal mood and affect. His behavior is  normal.  Nursing note and vitals reviewed.   ED Course  Procedures (including critical care time) Labs Review Labs Reviewed  CBC  COMPREHENSIVE METABOLIC PANEL  LIPASE, BLOOD    Imaging Review No results found. I have personally reviewed and evaluated these images and lab results as part of my medical decision-making.   EKG Interpretation None      MDM   Final diagnoses:  Constipation, unspecified constipation type    81 yo M with a chief complaint of constipation. Plenty of room in the vault for an enema. We'll have the patient do enema as well as resume MiraLAX therapy. Abdominal exam benign. Doubt colitis, bowel ischemia, or other serious intra-abdominal process. PCP follow-up.  6:53 PM:  I have discussed the diagnosis/risks/treatment options with the patient and believe the pt to be eligible for discharge home to follow-up with PCP. We also discussed returning to the ED immediately if new or worsening sx occur. We discussed the sx which are most concerning (e.g., sudden worsening pain, fever, inability to  tolerate by mouth) that necessitate immediate return. Medications administered to the patient during their visit and any new prescriptions provided to the patient are listed below.  Medications given during this visit Medications - No data to display  New Prescriptions   No medications on file    The patient appears reasonably screen and/or stabilized for discharge and I doubt any other medical condition or other [facility] requiring further screening, evaluation, or treatment in the ED at this time prior to discharge.     Deno Etienne, DO 04/03/15 [phone]

## 2015-04-03 NOTE — ED Notes (Signed)
md at bedside

## 2015-04-03 NOTE — ED Notes (Signed)
2 unsuccessful attempts at drawing labs - RN aware

## 2015-04-03 NOTE — ED Notes (Signed)
He states he has been unable to have b.m. X 3 days.  He also c/o mild abd. Tenderness and rectal discomfort.  He states he has been prone to constipation and had issues with it most of his life.  He is in no distress.

## 2015-04-06 ENCOUNTER — Other Ambulatory Visit: Payer: Self-pay | Admitting: Cardiovascular Disease

## 2015-04-12 ENCOUNTER — Other Ambulatory Visit: Payer: Self-pay | Admitting: Cardiovascular Disease

## 2015-04-12 DIAGNOSIS — E1142 Type 2 diabetes mellitus with diabetic polyneuropathy: Secondary | ICD-10-CM | POA: Diagnosis not present

## 2015-04-12 DIAGNOSIS — N183 Chronic kidney disease, stage 3 (moderate): Secondary | ICD-10-CM | POA: Diagnosis not present

## 2015-04-12 DIAGNOSIS — Z794 Long term (current) use of insulin: Secondary | ICD-10-CM | POA: Diagnosis not present

## 2015-04-12 DIAGNOSIS — E039 Hypothyroidism, unspecified: Secondary | ICD-10-CM | POA: Diagnosis not present

## 2015-04-12 DIAGNOSIS — Z6841 Body Mass Index (BMI) 40.0 and over, adult: Secondary | ICD-10-CM | POA: Diagnosis not present

## 2015-04-30 ENCOUNTER — Ambulatory Visit (INDEPENDENT_AMBULATORY_CARE_PROVIDER_SITE_OTHER): Payer: Medicare Other | Admitting: Podiatry

## 2015-04-30 DIAGNOSIS — M79673 Pain in unspecified foot: Secondary | ICD-10-CM | POA: Diagnosis not present

## 2015-04-30 DIAGNOSIS — M79609 Pain in unspecified limb: Principal | ICD-10-CM

## 2015-04-30 DIAGNOSIS — B351 Tinea unguium: Secondary | ICD-10-CM

## 2015-04-30 DIAGNOSIS — E1151 Type 2 diabetes mellitus with diabetic peripheral angiopathy without gangrene: Secondary | ICD-10-CM

## 2015-04-30 NOTE — Progress Notes (Signed)
PatComplaint:  Visit Type: Patient returns to my office for continued preventative foot care services. Complaint: Patient states" my nails have grown long and thick and become painful to walk and wear shoes" Patient has been diagnosed with DM with no complications. He presents for preventative foot care services. No changes to ROS  Podiatric Exam: Vascular: dorsalis pedis and posterior tibial pulses are not  palpable bilateral. Capillary return is immediate. Temperature gradient is WNL. Skin turgor WNL  Sensorium: Diminished l Semmes Weinstein monofilament test. Normal tactile sensation bilaterally. Nail Exam: Pt has thick disfigured discolored nails with subungual debris noted bilateral entire nail hallux through fifth toenails Ulcer Exam: There is no evidence of ulcer or pre-ulcerative changes or infection. Orthopedic Exam: Muscle tone and strength are WNL. No limitations in general ROM. No crepitus or effusions noted. Foot type and digits show no abnormalities. Bony prominences are unremarkable. Skin: No Porokeratosis. No infection or ulcers  Diagnosis:  Tinea unguium, Pain in right toe, pain in left toes  Treatment & Plan Procedures and Treatment: Consent by patient was obtained for treatment procedures. The patient understood the discussion of treatment and procedures well. All questions were answered thoroughly reviewed. Debridement of mycotic and hypertrophic toenails, 1 through 5 bilateral and clearing of subungual debris. No ulceration, no infection noted.  Return Visit-Office Procedure: Patient instructed to return to the office for a follow up visit 3 months for continued evaluation and treatment.ient ID: [name], male   DOB: [birth year], 81 y.o.   MRN: [id number]   [name] DPM

## 2015-05-04 ENCOUNTER — Other Ambulatory Visit: Payer: Self-pay | Admitting: Cardiovascular Disease

## 2015-05-04 NOTE — Telephone Encounter (Signed)
Rx request sent to pharmacy.  

## 2015-05-18 ENCOUNTER — Other Ambulatory Visit: Payer: Self-pay | Admitting: Cardiovascular Disease

## 2015-05-25 ENCOUNTER — Ambulatory Visit (INDEPENDENT_AMBULATORY_CARE_PROVIDER_SITE_OTHER): Payer: Medicare Other | Admitting: *Deleted

## 2015-05-25 DIAGNOSIS — I495 Sick sinus syndrome: Secondary | ICD-10-CM

## 2015-05-26 ENCOUNTER — Encounter: Payer: Self-pay | Admitting: Cardiology

## 2015-05-26 NOTE — Progress Notes (Signed)
Remote pacemaker transmission.   

## 2015-05-30 ENCOUNTER — Other Ambulatory Visit: Payer: Self-pay | Admitting: Cardiovascular Disease

## 2015-05-31 NOTE — Telephone Encounter (Signed)
Rx(s) sent to pharmacy electronically.  

## 2015-06-02 ENCOUNTER — Encounter: Payer: Self-pay | Admitting: Cardiology

## 2015-06-02 LAB — CUP PACEART REMOTE DEVICE CHECK
Battery Remaining Longevity: 107 mo
Battery Remaining Percentage: 91 %
Battery Voltage: 2.95 V
Brady Statistic AP VP Percent: 8.7 %
Brady Statistic AP VS Percent: 30 %
Brady Statistic AS VP Percent: 12 %
Brady Statistic AS VS Percent: 49 %
Brady Statistic RA Percent Paced: 37 %
Brady Statistic RV Percent Paced: 21 %
Date Time Interrogation Session: 20170103071726
Implantable Lead Implant Date: 19901207
Implantable Lead Implant Date: 20111110
Implantable Lead Location: 753862
Implantable Lead Location: 753862
Lead Channel Impedance Value: 300 Ohm
Lead Channel Impedance Value: 550 Ohm
Lead Channel Pacing Threshold Amplitude: 0.875 V
Lead Channel Pacing Threshold Amplitude: 1.125 V
Lead Channel Pacing Threshold Pulse Width: 0.4 ms
Lead Channel Pacing Threshold Pulse Width: 0.6 ms
Lead Channel Sensing Intrinsic Amplitude: 5 mV
Lead Channel Sensing Intrinsic Amplitude: 8.4 mV
Lead Channel Setting Pacing Amplitude: 1.375
Lead Channel Setting Pacing Amplitude: 1.875
Lead Channel Setting Pacing Pulse Width: 0.6 ms
Lead Channel Setting Sensing Sensitivity: 3 mV
Pulse Gen Model: 2210
Pulse Gen Serial Number: 7174903

## 2015-06-14 DIAGNOSIS — E1142 Type 2 diabetes mellitus with diabetic polyneuropathy: Secondary | ICD-10-CM | POA: Diagnosis not present

## 2015-06-14 DIAGNOSIS — R8290 Unspecified abnormal findings in urine: Secondary | ICD-10-CM | POA: Diagnosis not present

## 2015-06-14 DIAGNOSIS — Z794 Long term (current) use of insulin: Secondary | ICD-10-CM | POA: Diagnosis not present

## 2015-06-15 ENCOUNTER — Other Ambulatory Visit: Payer: Self-pay | Admitting: Cardiovascular Disease

## 2015-06-15 NOTE — Telephone Encounter (Signed)
Rx(s) sent to pharmacy electronically.  

## 2015-07-15 ENCOUNTER — Telehealth: Payer: Self-pay | Admitting: Cardiovascular Disease

## 2015-07-15 NOTE — Telephone Encounter (Signed)
Transmission received.

## 2015-07-15 NOTE — Telephone Encounter (Signed)
Transmission reviewed- normal device function. No episodes correlating with the time of his symptoms. Patient made aware. He states that he is having tingling in his fingers and his balance is not quite right. He did not feel comfortable driving his wife to an appt this morning so he asked someone else to take her. He says he is trying to rest currently. He denies unilateral weakness, crooked smile, change in speech. I let him know that if he has another episode similar to last night and symptoms persist that the ER is always an option and I will defer back to general cardiology.

## 2015-07-15 NOTE — Telephone Encounter (Signed)
Spoke w/ pt and instructed him how to send remote transmission. Pt verbalized understanding.

## 2015-07-15 NOTE — Telephone Encounter (Signed)
Returned call to patient.  He states last night he woke up and had what he felt like was an irregular heart beat. He felt like his heart was in his throat and felt like he drank a gallon of coffee. He states he felt bloated. He denied chest pain and shortness of breath during this episode (he has shortness of breath normally, but it was not worse). He states he has experienced something similar to this and he ended up in the hospital with a pacemaker.   Patient states this AM he feels better, no complaints other than bloating.  BP this AM 135/70s and HR in 80s - which he states is too high for him.  The previously documented BP readings were from around 2am.   Advised patient to send in a device transmission to investigate rhythm - he states he is unsure how. Will send message to device clinic to assist patient with this. Message routed to MD for further review of symptoms.

## 2015-07-15 NOTE — Telephone Encounter (Signed)
New message   Pt states that he did not sleep well   Patient c/o Palpitations:  High priority if patient c/o lightheadedness and shortness of breath.  1. How long have you been having palpitations? 07/14/2015 evening throughout night 2. Are you currently experiencing lightheadedness and shortness of breath? no 3. Have you checked your BP and heart rate? (document readings) 170/90   Hr  96 while resting  4. Are you experiencing any other symptoms? Stomach felt full of air

## 2015-07-30 ENCOUNTER — Ambulatory Visit (INDEPENDENT_AMBULATORY_CARE_PROVIDER_SITE_OTHER): Payer: Medicare Other | Admitting: Podiatry

## 2015-07-30 ENCOUNTER — Encounter: Payer: Self-pay | Admitting: Podiatry

## 2015-07-30 DIAGNOSIS — E1151 Type 2 diabetes mellitus with diabetic peripheral angiopathy without gangrene: Secondary | ICD-10-CM

## 2015-07-30 DIAGNOSIS — M79673 Pain in unspecified foot: Secondary | ICD-10-CM | POA: Diagnosis not present

## 2015-07-30 DIAGNOSIS — B351 Tinea unguium: Secondary | ICD-10-CM

## 2015-07-30 DIAGNOSIS — M79609 Pain in unspecified limb: Principal | ICD-10-CM

## 2015-07-30 NOTE — Progress Notes (Signed)
PatComplaint:  Visit Type: Patient returns to my office for continued preventative foot care services. Complaint: Patient states" my nails have grown long and thick and become painful to walk and wear shoes" Patient has been diagnosed with DM with no complications. He presents for preventative foot care services. No changes to ROS  Podiatric Exam: Vascular: dorsalis pedis and posterior tibial pulses are not  palpable bilateral. Capillary return is immediate. Temperature gradient is WNL. Skin turgor WNL  Sensorium: Diminished l Semmes Weinstein monofilament test. Normal tactile sensation bilaterally. Nail Exam: Pt has thick disfigured discolored nails with subungual debris noted bilateral entire nail hallux through fifth toenails Ulcer Exam: There is no evidence of ulcer or pre-ulcerative changes or infection. Orthopedic Exam: Muscle tone and strength are WNL. No limitations in general ROM. No crepitus or effusions noted. Foot type and digits show no abnormalities. Bony prominences are unremarkable. Skin: No Porokeratosis. No infection or ulcers  Diagnosis:  Tinea unguium, Pain in right toe, pain in left toes  Treatment & Plan Procedures and Treatment: Consent by patient was obtained for treatment procedures. The patient understood the discussion of treatment and procedures well. All questions were answered thoroughly reviewed. Debridement of mycotic and hypertrophic toenails, 1 through 5 bilateral and clearing of subungual debris. No ulceration, no infection noted.  Return Visit-Office Procedure: Patient instructed to return to the office for a follow up visit 3 months for continued evaluation and treatment.ient ID: [name], male   DOB: 1932/01/26, 82 y.o.   MRN: [id number]   [name] DPM

## 2015-08-24 ENCOUNTER — Ambulatory Visit (INDEPENDENT_AMBULATORY_CARE_PROVIDER_SITE_OTHER): Payer: Medicare Other | Admitting: *Deleted

## 2015-08-24 DIAGNOSIS — I495 Sick sinus syndrome: Secondary | ICD-10-CM | POA: Diagnosis not present

## 2015-08-24 NOTE — Progress Notes (Signed)
Remote pacemaker transmission.   

## 2015-09-09 DIAGNOSIS — N183 Chronic kidney disease, stage 3 (moderate): Secondary | ICD-10-CM | POA: Diagnosis not present

## 2015-09-09 DIAGNOSIS — E1142 Type 2 diabetes mellitus with diabetic polyneuropathy: Secondary | ICD-10-CM | POA: Diagnosis not present

## 2015-09-09 DIAGNOSIS — Z6841 Body Mass Index (BMI) 40.0 and over, adult: Secondary | ICD-10-CM | POA: Diagnosis not present

## 2015-09-09 DIAGNOSIS — R2689 Other abnormalities of gait and mobility: Secondary | ICD-10-CM | POA: Diagnosis not present

## 2015-09-09 DIAGNOSIS — H8110 Benign paroxysmal vertigo, unspecified ear: Secondary | ICD-10-CM | POA: Diagnosis not present

## 2015-09-09 DIAGNOSIS — Z794 Long term (current) use of insulin: Secondary | ICD-10-CM | POA: Diagnosis not present

## 2015-09-09 DIAGNOSIS — E039 Hypothyroidism, unspecified: Secondary | ICD-10-CM | POA: Diagnosis not present

## 2015-09-23 ENCOUNTER — Ambulatory Visit: Payer: Medicare Other | Attending: Internal Medicine | Admitting: Physical Therapy

## 2015-09-23 DIAGNOSIS — R2681 Unsteadiness on feet: Secondary | ICD-10-CM | POA: Insufficient documentation

## 2015-09-23 DIAGNOSIS — R42 Dizziness and giddiness: Secondary | ICD-10-CM | POA: Insufficient documentation

## 2015-09-23 NOTE — Therapy (Signed)
[facility] [address] [place], [place], [address] Phone: [phone]   Fax:  [phone]  Physical Therapy Evaluation  Patient Details  Name: [name] MRN: [id number] Date of Birth: 24-Jun-1931 Referring Provider: Dr Delrae Rend  Encounter Date: 09/23/2015      PT End of Session - 09/23/15 1629    Visit Number 1   Number of Visits 12   Date for PT Re-Evaluation 11/04/15   PT Start Time 1548   PT Stop Time 1624   PT Time Calculation (min) 36 min   Activity Tolerance Patient tolerated treatment well   Behavior During Therapy [facility] for tasks assessed/performed      Past Medical History  Diagnosis Date  . Pacemaker 2011    St. Jude Accent DDDR ; this is the third generator this pt. has had  . Diabetes mellitus without complication ([place])   . Cancer ([place])   . Shingles   . GI bleed   . Gastric ulcer   . UTI (lower urinary tract infection)   . Blepharitis   . Hx of echocardiogram 08-15-2012    EF  45-50 % very difficult study, severe LVH, pacemaker, trace TR,mildly enlarged RV,; aorta mildly calcified  . History of stress test 06/06/2012    low risk scan. fixed inferior defect consistant with prior infarction with moderatly reduced EF. no appreciable evidence of ischemia. no significant change from previous study  . Abnormal Doppler ultrasound of carotid artery 12/16/2012    mildly abnormal doppler . no prior studies to compare to. follow up studies are recommended when clinically indicated.  . Exogenous obesity     Past Surgical History  Procedure Laterality Date  . Knee surgery Right   . Spine surgery    . Prostate biopsy    . Prostate seed implants    . Cardiac stents    . Wisdom tooth extraction    . Cataract extraction    . Cardiac catheterization  12-29-2003    rt. coronary angiography was done with a 6 french 4 cm taper coronary cath. This demonstrated  approximately 70 to 75% mildly segmental stenosis  in the mid RCA before the acute margin          . Cardiac catheterization  11-19-2003    diagnostic cath,, pt. to get second cath to have stent placed    There were no vitals filed for this visit.       Subjective Assessment - 09/23/15 1551    Subjective Pt is an 82 y/o male who presents to OPPT with c/o vertigo and spinning sensation as well as decreased sensation in feet affecting balance.  Pt reports increased difficulty with walking tandem and SLS.     Pertinent History see snapshot; Pacemaker   Patient Stated Goals improve balance and dizziness   Currently in Pain? No/denies            [facility] PT Assessment - 09/23/15 1553    Assessment   Medical Diagnosis vertigo; peripheral neuropathy   Referring Provider Dr Delrae Rend   Onset Date/Surgical Date --  unable to state   Next MD Visit PRN   Prior Therapy for back just recently   Precautions   Precautions ICD/Pacemaker   Restrictions   Weight Bearing Restrictions No   Balance Screen   Has the patient fallen in the past 6 months No   Has the patient had a decrease in activity level because of a fear of falling?  No   Is the patient reluctant to leave their home because of a fear of falling?  No   Home Environment   Living Environment Private residence   Living Arrangements Spouse/significant other;Children   Type of [place] to enter   Entrance Stairs-Number of Steps 16   Entrance Stairs-Rails Can reach both;Left;Right   Home Layout Multi-level   Prior Function   Level of Independence Independent   Vocation Retired   Leisure [employer]   Overall Cognitive Status Within Functional Limits for tasks assessed   Observation/Other Assessments   Focus on Therapeutic Outcomes (FOTO)  56 (44% limited; predicted 26% limited)    Dizziness Handicap Inventory (DHI)  30   Functional Tests   Functional tests Single leg stance;Other;Other2   Single Leg Stance   Comments < 2 sec bilaterally    Other:   Other/ Comments Tandem Stand: 4 sec with either foot   Other:   Other/Comments Level Ground: EO x 30 sec; EC x 10 sec with increased postural sway / Compliant Surface: EO x 20 sec with increased sway; EC x 5 sec with min A to correct LOB   Posture/Postural Control   Posture/Postural Control Postural limitations   Postural Limitations Rounded Shoulders;Forward head   Ambulation/Gait   Ambulation/Gait Yes   Ambulation/Gait Assistance 6: Modified independent (Device/Increase time)   Gait Pattern Wide base of support   Gait Comments no significant gait deviations noted            Vestibular Assessment - 09/23/15 1558    Symptom Behavior   Type of Dizziness Spinning   Frequency of Dizziness 2-3 times/wk; always with lying down   Duration of Dizziness "I usually just fall asleep" otherwise momentarily   Aggravating Factors Lying supine   Relieving Factors Closing eyes   Occulomotor Exam   Occulomotor Alignment Normal   Spontaneous Absent   Gaze-induced Absent   Smooth Pursuits Intact   Saccades Intact   Comment positive head thrust to L; negative to R   Vestibulo-Occular Reflex   VOR 1 Head Only (x 1 viewing) WNL   Positional Testing   Dix-Hallpike Dix-Hallpike Right;Dix-Hallpike Left   Sidelying Test Sidelying Right;Sidelying Left   Horizontal Canal Testing Horizontal Canal Right;Horizontal Canal Left   Dix-Hallpike Right   Dix-Hallpike Right Duration none   Dix-Hallpike Right Symptoms No nystagmus  unable to achieve neck extension   Dix-Hallpike Left   Dix-Hallpike Left Duration none   Dix-Hallpike Left Symptoms No nystagmus  unable to achieve neck extension   Sidelying Right   Sidelying Right Duration none   Sidelying Right Symptoms No nystagmus   Sidelying Left   Sidelying Left Duration none   Sidelying Left Symptoms No nystagmus   Horizontal Canal Right   Horizontal Canal Right Duration none   Horizontal Canal Right Symptoms Normal   Horizontal Canal  Left   Horizontal Canal Left Duration none   Horizontal Canal Left Symptoms Normal                       PT Education - 09/23/15 1629    Education provided Yes   Education Details clinical findings, POC, goals of care   Person(s) Educated Patient   Methods Explanation   Comprehension Verbalized understanding          PT Short Term Goals - 09/23/15 1633    PT SHORT TERM GOAL #1   Title verbalize understanding of  fall prevention strategies (10/14/15)   Time 3   Period Weeks   Status New           PT Long Term Goals - 09/23/15 1634    PT LONG TERM GOAL #1   Title independent with HEP (11/04/15)   Time 6   Period Weeks   Status New   PT LONG TERM GOAL #2   Title improve tandem stand to > 15 sec bil for improved balance and mobility (11/04/15)   Time 6   Period Weeks   Status New   PT LONG TERM GOAL #3   Title perform SLS > 5 sec bil LE for improved function and balance (11/04/15)   Time 6   Period Weeks   Status New   PT LONG TERM GOAL #4   Title ambulate > 500' on various indoor/outdoor surfaces with LRAD modified independent for improved function and mobility (11/04/15)   Time 6   Period Weeks   Status New               Plan - 09/23/15 1630    Clinical Impression Statement Pt is an 82 y/o male who presents to OPPT for moderate complexity evaluation of vertigo and balance deficits.  Subjective history consistent with BPPV however unable to provoke symptoms in clinic today.  Pt demonstrates high level balance deficits consistent with decreased vestibular and somatosensory (due to neuropathy) input.  Will benefit from PT to maximize functional mobility and improve balance.   Rehab Potential Good   PT Frequency 2x / week   PT Duration 6 weeks  anticipate d/c in 4 wks   PT Treatment/Interventions ADLs/Self Care Home Management;Canalith Repostioning;Moist Heat;Patient/family education;Neuromuscular re-education;Balance training;Therapeutic  exercise;Therapeutic activities;Functional mobility training;Stair training;Gait training;Vestibular   PT Next Visit Plan HEP for balance; corner balance exercises; ?SOT; reassess BPPV as needed   Consulted and Agree with Plan of Care Patient      Patient will benefit from skilled therapeutic intervention in order to improve the following deficits and impairments:  Abnormal gait, Postural dysfunction, Dizziness, Decreased balance  Visit Diagnosis: Unsteadiness on feet - Plan: PT plan of care cert/re-cert  Dizziness and giddiness - Plan: PT plan of care cert/re-cert      G-Codes - [id number]    Functional Assessment Tool Used DHI: 30   Functional Limitation Changing and maintaining body position   Changing and Maintaining Body Position Current Status [id number]) At least 20 percent but less than 40 percent impaired, limited or restricted   Changing and Maintaining Body Position Goal Status [id number]) At least 1 percent but less than 20 percent impaired, limited or restricted       Problem List Patient Active Problem List   Diagnosis Date Noted  . Chronic combined systolic and diastolic heart failure ([place]) 02/19/2015  . Sinus node dysfunction 03/09/2014  . Second degree AV block 03/09/2014  . Pacemaker - dual chamber St. Jude RF 07/24/2013  . Cardiomyopathy, ischemic 07/24/2013  . CAD (coronary artery disease) 07/24/2013  . OSA (obstructive sleep apnea) 07/24/2013  . CKD (chronic kidney disease) stage 3, GFR 30-59 ml/min 07/24/2013  . DM type 2 causing CKD stage 3 ([place]) 07/24/2013  . Mixed hyperlipidemia 07/24/2013   [name], PT, DPT 09/23/2015 4:37 PM  [facility] [address] [place], [place], [address] Phone: [phone]   Fax:  [phone]  Name: [name] MRN: [id number] Date of Birth: 1932-03-19

## 2015-09-27 ENCOUNTER — Ambulatory Visit: Payer: Medicare Other | Admitting: Physical Therapy

## 2015-09-27 ENCOUNTER — Encounter: Payer: Self-pay | Admitting: Physical Therapy

## 2015-09-27 DIAGNOSIS — R2681 Unsteadiness on feet: Secondary | ICD-10-CM | POA: Diagnosis not present

## 2015-09-27 DIAGNOSIS — R42 Dizziness and giddiness: Secondary | ICD-10-CM

## 2015-09-27 NOTE — Patient Instructions (Addendum)
Feet Heel-Toe "Tandem", Arm Motion - Eyes Open    With eyes open, right foot directly in front of the other, keep hands in front. Repeat _3___ times per session. Hold 30 seconds. Do __1-2__ sessions per day.  Copyright  VHI. All rights reserved.     Single Leg - Eyes Open    Holding support, lift right leg while maintaining balance over other leg. Progress to removing hands from support surface for longer periods of time. Hold_10___ seconds. Repeat __3__ times per session. Do _1-2___ sessions per day.  Copyright  VHI. All rights reserved.  Bridging    Slowly raise buttocks from floor, keeping stomach tight. Repeat _10___ times per set. Do _2___ sets per session. Do __1-2__ sessions per day.  http://orth.exer.us/1096   Copyright  VHI. All rights reserved.  ABDUCTION: Standing (Active)    Stand, feet flat. Lift right leg out to side.  Complete _2__ sets of _10__ repetitions. Perform _1-2__ sessions per day.  http://gtsc.exer.us/110   Copyright  VHI. All rights reserved.

## 2015-09-27 NOTE — Therapy (Signed)
[facility] [address] [place], [place], [address] Phone: [phone]   Fax:  [phone]  Physical Therapy Treatment  Patient Details  Name: [name] MRN: [id number] Date of Birth: 1931/05/31 Referring Provider: Dr Delrae Rend  Encounter Date: 09/27/2015      PT End of Session - 09/27/15 1448    Visit Number 2   Number of Visits 12   Date for PT Re-Evaluation 11/04/15   PT Start Time [id number]   PT Stop Time 1443   PT Time Calculation (min) 38 min   Activity Tolerance Patient tolerated treatment well   Behavior During Therapy [facility] for tasks assessed/performed      Past Medical History  Diagnosis Date  . Pacemaker 2011    St. Jude Accent DDDR ; this is the third generator this pt. has had  . Diabetes mellitus without complication ([place])   . Cancer ([place])   . Shingles   . GI bleed   . Gastric ulcer   . UTI (lower urinary tract infection)   . Blepharitis   . Hx of echocardiogram 08-15-2012    EF  45-50 % very difficult study, severe LVH, pacemaker, trace TR,mildly enlarged RV,; aorta mildly calcified  . History of stress test 06/06/2012    low risk scan. fixed inferior defect consistant with prior infarction with moderatly reduced EF. no appreciable evidence of ischemia. no significant change from previous study  . Abnormal Doppler ultrasound of carotid artery 12/16/2012    mildly abnormal doppler . no prior studies to compare to. follow up studies are recommended when clinically indicated.  . Exogenous obesity     Past Surgical History  Procedure Laterality Date  . Knee surgery Right   . Spine surgery    . Prostate biopsy    . Prostate seed implants    . Cardiac stents    . Wisdom tooth extraction    . Cataract extraction    . Cardiac catheterization  12-29-2003    rt. coronary angiography was done with a 6 french 4 cm taper coronary cath. This demonstrated  approximately 70 to 75% mildly segmental stenosis  in the mid RCA before the acute margin          . Cardiac catheterization  11-19-2003    diagnostic cath,, pt. to get second cath to have stent placed    There were no vitals filed for this visit.      Subjective Assessment - 09/27/15 1408    Subjective "Just momentarily" vertigo episodes when tired around bed time.   Pertinent History see snapshot; Pacemaker   Patient Stated Goals improve balance and dizziness   Currently in Pain? No/denies          Treatment: See exercises for balance and strengthening in handout below. Performed as written with cues for technique  And intermittent UE support for balance activites.                       PT Education - 09/27/15 1424    Education provided Yes   Education Details HEP for standing balance   Person(s) Educated Patient   Methods Explanation;Demonstration;Handout;Verbal cues   Comprehension Verbalized understanding;Need further instruction;Returned demonstration;Verbal cues required          PT Short Term Goals - 09/23/15 1633    PT SHORT TERM GOAL #1   Title verbalize understanding of fall prevention strategies (10/14/15)   Time 3   Period Weeks  Status New           PT Long Term Goals - 09/23/15 1634    PT LONG TERM GOAL #1   Title independent with HEP (11/04/15)   Time 6   Period Weeks   Status New   PT LONG TERM GOAL #2   Title improve tandem stand to > 15 sec bil for improved balance and mobility (11/04/15)   Time 6   Period Weeks   Status New   PT LONG TERM GOAL #3   Title perform SLS > 5 sec bil LE for improved function and balance (11/04/15)   Time 6   Period Weeks   Status New   PT LONG TERM GOAL #4   Title ambulate > 500' on various indoor/outdoor surfaces with LRAD modified independent for improved function and mobility (11/04/15)   Time 6   Period Weeks   Status New               Plan - 09/27/15 1434    Clinical Impression Statement Initiated HEP for standing balance  and hip strengthening. Pt demonstrated being challenged by exercises fatiguing quickly requiring rest breaks.   Rehab Potential Good   PT Frequency 2x / week   PT Duration 6 weeks  anticipate d/c in 4 wks   PT Treatment/Interventions ADLs/Self Care Home Management;Canalith Repostioning;Moist Heat;Patient/family education;Neuromuscular re-education;Balance training;Therapeutic exercise;Therapeutic activities;Functional mobility training;Stair training;Gait training;Vestibular   PT Next Visit Plan Review HEP for balance; corner balance exercises; ?SOT; reassess BPPV as needed   Consulted and Agree with Plan of Care Patient      Patient will benefit from skilled therapeutic intervention in order to improve the following deficits and impairments:  Abnormal gait, Postural dysfunction, Dizziness, Decreased balance  Visit Diagnosis: Unsteadiness on feet  Dizziness and giddiness     Problem List Patient Active Problem List   Diagnosis Date Noted  . Chronic combined systolic and diastolic heart failure ([place]) 02/19/2015  . Sinus node dysfunction 03/09/2014  . Second degree AV block 03/09/2014  . Pacemaker - dual chamber St. Jude RF 07/24/2013  . Cardiomyopathy, ischemic 07/24/2013  . CAD (coronary artery disease) 07/24/2013  . OSA (obstructive sleep apnea) 07/24/2013  . CKD (chronic kidney disease) stage 3, GFR 30-59 ml/min 07/24/2013  . DM type 2 causing CKD stage 3 ([place]) 07/24/2013  . Mixed hyperlipidemia 07/24/2013    Bjorn Loser, PTA  09/27/2015, 3:00 PM [facility] [address] [place], [place], [address] Phone: [phone]   Fax:  [phone]  Name: [name] MRN: [id number] Date of Birth: Dec 27, 1931

## 2015-09-29 ENCOUNTER — Ambulatory Visit: Payer: Medicare Other | Admitting: Physical Therapy

## 2015-09-29 DIAGNOSIS — R2681 Unsteadiness on feet: Secondary | ICD-10-CM

## 2015-09-29 DIAGNOSIS — R42 Dizziness and giddiness: Secondary | ICD-10-CM | POA: Diagnosis not present

## 2015-09-29 NOTE — Patient Instructions (Signed)
  STAND IN A CORNER WITH A CHAIR IN FRONT OF YOU   Feet Apart (Compliant Surface) Head Motion - Eyes Closed    Stand on compliant surface: __pillow or cushion__ with feet shoulder width apart. Close eyes and move head slowly, up and down 10 times and side to side 10 times. Repeat _1-2___ times per session. Do __1-2__ sessions per day.  Copyright  VHI. All rights reserved.   Feet Apart (Compliant Surface) Varied Arm Positions - Eyes Closed    Stand on compliant surface: _pillow or cushion__ with feet shoulder width apart and arms at your side. Close eyes and visualize upright position. Hold__10-15__ seconds. Repeat _5___ times per session. Do _1-2___ sessions per day.  Copyright  VHI. All rights reserved.

## 2015-09-29 NOTE — Therapy (Signed)
[facility] [address] [place], [place], [address] Phone: [phone]   Fax:  [phone]  Physical Therapy Treatment  Patient Details  Name: [name] MRN: [id number] Date of Birth: [birth year] Referring Provider: Dr Delrae Rend  Encounter Date: 09/29/2015      PT End of Session - 09/29/15 1557    Visit Number 3   Number of Visits 12   Date for PT Re-Evaluation 11/04/15   PT Start Time [id number]   PT Stop Time 1556   PT Time Calculation (min) 40 min   Activity Tolerance Patient tolerated treatment well   Behavior During Therapy [facility] for tasks assessed/performed      Past Medical History  Diagnosis Date  . Pacemaker 2011    St. Jude Accent DDDR ; this is the third generator this pt. has had  . Diabetes mellitus without complication ([place])   . Cancer ([place])   . Shingles   . GI bleed   . Gastric ulcer   . UTI (lower urinary tract infection)   . Blepharitis   . Hx of echocardiogram 08-15-2012    EF  45-50 % very difficult study, severe LVH, pacemaker, trace TR,mildly enlarged RV,; aorta mildly calcified  . History of stress test 06/06/2012    low risk scan. fixed inferior defect consistant with prior infarction with moderatly reduced EF. no appreciable evidence of ischemia. no significant change from previous study  . Abnormal Doppler ultrasound of carotid artery 12/16/2012    mildly abnormal doppler . no prior studies to compare to. follow up studies are recommended when clinically indicated.  . Exogenous obesity     Past Surgical History  Procedure Laterality Date  . Knee surgery Right   . Spine surgery    . Prostate biopsy    . Prostate seed implants    . Cardiac stents    . Wisdom tooth extraction    . Cataract extraction    . Cardiac catheterization  12-29-2003    rt. coronary angiography was done with a 6 french 4 cm taper coronary cath. This demonstrated  approximately 70 to 75% mildly segmental stenosis  in the mid RCA before the acute margin          . Cardiac catheterization  11-19-2003    diagnostic cath,, pt. to get second cath to have stent placed    There were no vitals filed for this visit.      Subjective Assessment - 09/29/15 1518    Subjective doing exercises "a little bit;" no spinning   Patient Stated Goals improve balance and dizziness   Currently in Pain? No/denies                         [facility] Adult PT Treatment/Exercise - 09/29/15 1532    Exercises   Exercises Knee/Hip   Knee/Hip Exercises: Standing   Heel Raises Both;20 reps   Hip Abduction Both;10 reps;Knee straight   Abduction Limitations alternating   Hip Extension Both;10 reps;Knee straight   Extension Limitations alternating   Knee/Hip Exercises: Supine   Bridges Both;10 reps   Other Supine Knee/Hip Exercises single limb clamshell with green theraband x 10 bil             Balance Exercises - 09/29/15 1526    Balance Exercises: Standing   Standing Eyes Closed Foam/compliant surface;Wide (BOA);5 reps;10 secs;Head turns   Tandem Stance Eyes open;3 reps;30 secs   SLS Eyes open;Solid surface;5  reps;10 secs   Tandem Gait Forward;Intermittent upper extremity support;4 reps   Retro Gait 4 reps  no UE support   Sidestepping 4 reps  no UE support             PT Short Term Goals - 09/23/15 1633    PT SHORT TERM GOAL #1   Title verbalize understanding of fall prevention strategies (10/14/15)   Time 3   Period Weeks   Status New           PT Long Term Goals - 09/23/15 1634    PT LONG TERM GOAL #1   Title independent with HEP (11/04/15)   Time 6   Period Weeks   Status New   PT LONG TERM GOAL #2   Title improve tandem stand to > 15 sec bil for improved balance and mobility (11/04/15)   Time 6   Period Weeks   Status New   PT LONG TERM GOAL #3   Title perform SLS > 5 sec bil LE for improved function and balance (11/04/15)   Time 6   Period Weeks   Status New   PT LONG  TERM GOAL #4   Title ambulate > 500' on various indoor/outdoor surfaces with LRAD modified independent for improved function and mobility (11/04/15)   Time 6   Period Weeks   Status New               Plan - 09/29/15 1557    Clinical Impression Statement Pt independent with initial HEP and tolerated compliant surface exercises well today; and added to HEP.  Will continue to benefit from PT to maximize function and improve balance.   PT Next Visit Plan review corner balance exercises; continue balance/strengthening/functional mobility      Patient will benefit from skilled therapeutic intervention in order to improve the following deficits and impairments:     Visit Diagnosis: Unsteadiness on feet  Dizziness and giddiness     Problem List Patient Active Problem List   Diagnosis Date Noted  . Chronic combined systolic and diastolic heart failure ([place]) 02/19/2015  . Sinus node dysfunction 03/09/2014  . Second degree AV block 03/09/2014  . Pacemaker - dual chamber St. Jude RF 07/24/2013  . Cardiomyopathy, ischemic 07/24/2013  . CAD (coronary artery disease) 07/24/2013  . OSA (obstructive sleep apnea) 07/24/2013  . CKD (chronic kidney disease) stage 3, GFR 30-59 ml/min 07/24/2013  . DM type 2 causing CKD stage 3 ([employer]) 07/24/2013  . Mixed hyperlipidemia 07/24/2013   [name], PT, DPT 09/29/2015 3:59 PM  [facility] [address] [name], [address] Phone: [phone]   Fax:  [phone]  Name: [name] MRN: [id number] Date of Birth: 03/19/1932

## 2015-10-12 ENCOUNTER — Ambulatory Visit: Payer: Medicare Other | Admitting: Physical Therapy

## 2015-10-12 DIAGNOSIS — R42 Dizziness and giddiness: Secondary | ICD-10-CM | POA: Diagnosis not present

## 2015-10-12 DIAGNOSIS — R2681 Unsteadiness on feet: Secondary | ICD-10-CM | POA: Diagnosis not present

## 2015-10-12 NOTE — Therapy (Signed)
[name] [address] [place], [place], [address] Phone: [phone]   Fax:  [phone]  Physical Therapy Treatment  Patient Details  Name: [name] MRN: [id number] Date of Birth: 31-Jul-1931 Referring Provider: Dr Delrae Rend  Encounter Date: 10/12/2015      PT End of Session - 10/12/15 1446    Visit Number 4   Number of Visits 12   Date for PT Re-Evaluation 11/04/15   PT Start Time 1402   PT Stop Time 1440   PT Time Calculation (min) 38 min   Equipment Utilized During Treatment Gait belt   Activity Tolerance Patient tolerated treatment well   Behavior During Therapy [facility] for tasks assessed/performed      Past Medical History  Diagnosis Date  . Pacemaker 2011    St. Jude Accent DDDR ; this is the third generator this pt. has had  . Diabetes mellitus without complication ([place])   . Cancer ([place])   . Shingles   . GI bleed   . Gastric ulcer   . UTI (lower urinary tract infection)   . Blepharitis   . Hx of echocardiogram 08-15-2012    EF  45-50 % very difficult study, severe LVH, pacemaker, trace TR,mildly enlarged RV,; aorta mildly calcified  . History of stress test 06/06/2012    low risk scan. fixed inferior defect consistant with prior infarction with moderatly reduced EF. no appreciable evidence of ischemia. no significant change from previous study  . Abnormal Doppler ultrasound of carotid artery 12/16/2012    mildly abnormal doppler . no prior studies to compare to. follow up studies are recommended when clinically indicated.  . Exogenous obesity     Past Surgical History  Procedure Laterality Date  . Knee surgery Right   . Spine surgery    . Prostate biopsy    . Prostate seed implants    . Cardiac stents    . Wisdom tooth extraction    . Cataract extraction    . Cardiac catheterization  12-29-2003    rt. coronary angiography was done with a 6 french 4 cm taper coronary cath. This demonstrated   approximately 70 to 75% mildly segmental stenosis in the mid RCA before the acute margin          . Cardiac catheterization  11-19-2003    diagnostic cath,, pt. to get second cath to have stent placed    There were no vitals filed for this visit.      Subjective Assessment - 10/12/15 1412    Subjective only did exercises twice since last visit > 1 week ago   Patient Stated Goals improve balance and dizziness   Currently in Pain? No/denies                         [facility] Adult PT Treatment/Exercise - 10/12/15 1412    Knee/Hip Exercises: Aerobic   Nustep SciFit L 4 x 8 min   Knee/Hip Exercises: Standing   Heel Raises Both;20 reps   Heel Raises Limitations toe raises x 20   Hip Abduction Both;10 reps;Knee straight   Abduction Limitations alternating   Hip Extension Both;10 reps;Knee straight   Extension Limitations alternating   Forward Step Up Both;10 reps;Hand Hold: 0;Step Height: 4"   Knee/Hip Exercises: Seated   Sit to Sand 10 reps;without UE support             Balance Exercises - 10/12/15 1425  Balance Exercises: Standing   Standing Eyes Closed Foam/compliant surface;Wide (BOA);5 reps;10 secs;Head turns             PT Short Term Goals - 09/23/15 1633    PT SHORT TERM GOAL #1   Title verbalize understanding of fall prevention strategies (10/14/15)   Time 3   Period Weeks   Status New           PT Long Term Goals - 09/23/15 1634    PT LONG TERM GOAL #1   Title independent with HEP (11/04/15)   Time 6   Period Weeks   Status New   PT LONG TERM GOAL #2   Title improve tandem stand to > 15 sec bil for improved balance and mobility (11/04/15)   Time 6   Period Weeks   Status New   PT LONG TERM GOAL #3   Title perform SLS > 5 sec bil LE for improved function and balance (11/04/15)   Time 6   Period Weeks   Status New   PT LONG TERM GOAL #4   Title ambulate > 500' on various indoor/outdoor surfaces with LRAD modified independent for  improved function and mobility (11/04/15)   Time 6   Period Weeks   Status New               Plan - 10/12/15 1446    Clinical Impression Statement Pt tolerated exercises well today; needed min cues for HEP as he did not perform consistently during missed week of PT.  Will continue to benefit from PT to maximize function.  Reinforced need for compliance with HEP.   PT Next Visit Plan continue balance/strengthening/functional mobility      Patient will benefit from skilled therapeutic intervention in order to improve the following deficits and impairments:     Visit Diagnosis: Unsteadiness on feet  Dizziness and giddiness     Problem List Patient Active Problem List   Diagnosis Date Noted  . Chronic combined systolic and diastolic heart failure ([place]) 02/19/2015  . Sinus node dysfunction 03/09/2014  . Second degree AV block 03/09/2014  . Pacemaker - dual chamber St. Jude RF 07/24/2013  . Cardiomyopathy, ischemic 07/24/2013  . CAD (coronary artery disease) 07/24/2013  . OSA (obstructive sleep apnea) 07/24/2013  . CKD (chronic kidney disease) stage 3, GFR 30-59 ml/min 07/24/2013  . DM type 2 causing CKD stage 3 ([place]) 07/24/2013  . Mixed hyperlipidemia 07/24/2013   [name], PT, DPT 10/12/2015 2:48 PM  [facility] [address] [place], [place], [address] Phone: [phone]   Fax:  [phone]  Name: [name] MRN: [id number] Date of Birth: 01/03/1932

## 2015-10-13 ENCOUNTER — Encounter: Payer: Self-pay | Admitting: Cardiology

## 2015-10-13 LAB — CUP PACEART REMOTE DEVICE CHECK
Battery Remaining Longevity: 95 mo
Battery Remaining Percentage: 81 %
Battery Voltage: 2.93 V
Brady Statistic AP VP Percent: 13 %
Brady Statistic AP VS Percent: 29 %
Brady Statistic AS VP Percent: 15 %
Brady Statistic AS VS Percent: 43 %
Brady Statistic RA Percent Paced: 41 %
Brady Statistic RV Percent Paced: 28 %
Date Time Interrogation Session: 20170404060008
Implantable Lead Implant Date: 19901207
Implantable Lead Implant Date: 20111110
Implantable Lead Location: 753862
Implantable Lead Location: 753862
Lead Channel Impedance Value: 300 Ohm
Lead Channel Impedance Value: 540 Ohm
Lead Channel Pacing Threshold Amplitude: 0.75 V
Lead Channel Pacing Threshold Amplitude: 1 V
Lead Channel Pacing Threshold Pulse Width: 0.4 ms
Lead Channel Pacing Threshold Pulse Width: 0.6 ms
Lead Channel Sensing Intrinsic Amplitude: 3.9 mV
Lead Channel Sensing Intrinsic Amplitude: 8.4 mV
Lead Channel Setting Pacing Amplitude: 1.25 V
Lead Channel Setting Pacing Amplitude: 1.75 V
Lead Channel Setting Pacing Pulse Width: 0.6 ms
Lead Channel Setting Sensing Sensitivity: 3 mV
Pulse Gen Model: 2210
Pulse Gen Serial Number: 7174903

## 2015-10-14 ENCOUNTER — Ambulatory Visit: Payer: Medicare Other | Admitting: Physical Therapy

## 2015-10-14 DIAGNOSIS — R2681 Unsteadiness on feet: Secondary | ICD-10-CM

## 2015-10-14 DIAGNOSIS — R42 Dizziness and giddiness: Secondary | ICD-10-CM

## 2015-10-14 NOTE — Therapy (Signed)
[facility] [address] [place], [place], [address] Phone: [phone]   Fax:  [phone]  Physical Therapy Treatment  Patient Details  Name: [name] MRN: [id number] Date of Birth: May 19, 1932 Referring Provider: Dr Delrae Rend  Encounter Date: 10/14/2015      PT End of Session - 10/14/15 1531    Visit Number 5   Number of Visits 12   Date for PT Re-Evaluation 11/04/15   PT Start Time [id number]  pt arrived late   PT Stop Time 1540   PT Time Calculation (min) 28 min   Activity Tolerance Patient tolerated treatment well   Behavior During Therapy [facility] for tasks assessed/performed      Past Medical History  Diagnosis Date  . Pacemaker 2011    St. Jude Accent DDDR ; this is the third generator this pt. has had  . Diabetes mellitus without complication ([place])   . Cancer ([place])   . Shingles   . GI bleed   . Gastric ulcer   . UTI (lower urinary tract infection)   . Blepharitis   . Hx of echocardiogram 08-15-2012    EF  45-50 % very difficult study, severe LVH, pacemaker, trace TR,mildly enlarged RV,; aorta mildly calcified  . History of stress test 06/06/2012    low risk scan. fixed inferior defect consistant with prior infarction with moderatly reduced EF. no appreciable evidence of ischemia. no significant change from previous study  . Abnormal Doppler ultrasound of carotid artery 12/16/2012    mildly abnormal doppler . no prior studies to compare to. follow up studies are recommended when clinically indicated.  . Exogenous obesity     Past Surgical History  Procedure Laterality Date  . Knee surgery Right   . Spine surgery    . Prostate biopsy    . Prostate seed implants    . Cardiac stents    . Wisdom tooth extraction    . Cataract extraction    . Cardiac catheterization  12-29-2003    rt. coronary angiography was done with a 6 french 4 cm taper coronary cath. This demonstrated  approximately 70 to 75% mildly  segmental stenosis in the mid RCA before the acute margin          . Cardiac catheterization  11-19-2003    diagnostic cath,, pt. to get second cath to have stent placed    There were no vitals filed for this visit.      Subjective Assessment - 10/14/15 1513    Subjective pt arrived late to session; did exercises twice since Tuesday.  sore after last session   Patient Stated Goals improve balance and dizziness   Currently in Pain? No/denies                         [facility] Adult PT Treatment/Exercise - 10/14/15 1530    Self-Care   Self-Care Other Self-Care Comments   Other Self-Care Comments  educated on fall prevention strategies   Knee/Hip Exercises: Aerobic   Stepper seated L 4.0 x 10 min                PT Education - 10/14/15 1531    Education provided Yes   Education Details fall prevention    Person(s) Educated Patient   Methods Explanation;Handout   Comprehension Verbalized understanding          PT Short Term Goals - 10/14/15 1533    PT SHORT TERM  GOAL #1   Title verbalize understanding of fall prevention strategies (10/14/15)   Status Achieved           PT Long Term Goals - 09/23/15 1634    PT LONG TERM GOAL #1   Title independent with HEP (11/04/15)   Time 6   Period Weeks   Status New   PT LONG TERM GOAL #2   Title improve tandem stand to > 15 sec bil for improved balance and mobility (11/04/15)   Time 6   Period Weeks   Status New   PT LONG TERM GOAL #3   Title perform SLS > 5 sec bil LE for improved function and balance (11/04/15)   Time 6   Period Weeks   Status New   PT LONG TERM GOAL #4   Title ambulate > 500' on various indoor/outdoor surfaces with LRAD modified independent for improved function and mobility (11/04/15)   Time 6   Period Weeks   Status New               Plan - 10/14/15 1532    Clinical Impression Statement Pt arrived late to session today; STG #1 met.  Will continue to benefit from PT to  maximize function and mobility.   PT Next Visit Plan continue balance/strengthening/functional mobility      Patient will benefit from skilled therapeutic intervention in order to improve the following deficits and impairments:     Visit Diagnosis: Unsteadiness on feet  Dizziness and giddiness     Problem List Patient Active Problem List   Diagnosis Date Noted  . Chronic combined systolic and diastolic heart failure ([place]) 02/19/2015  . Sinus node dysfunction 03/09/2014  . Second degree AV block 03/09/2014  . Pacemaker - dual chamber St. Jude RF 07/24/2013  . Cardiomyopathy, ischemic 07/24/2013  . CAD (coronary artery disease) 07/24/2013  . OSA (obstructive sleep apnea) 07/24/2013  . CKD (chronic kidney disease) stage 3, GFR 30-59 ml/min 07/24/2013  . DM type 2 causing CKD stage 3 ([place]) 07/24/2013  . Mixed hyperlipidemia 07/24/2013   [name], PT, DPT 10/14/2015 3:40 PM  [facility] [address] [place], [place], [address] Phone: [phone]   Fax:  [phone]  Name: [name] MRN: [id number] Date of Birth: [birth year]

## 2015-10-14 NOTE — Patient Instructions (Signed)

## 2015-10-20 ENCOUNTER — Ambulatory Visit: Payer: Medicare Other | Admitting: Physical Therapy

## 2015-10-20 DIAGNOSIS — R42 Dizziness and giddiness: Secondary | ICD-10-CM

## 2015-10-20 DIAGNOSIS — R2681 Unsteadiness on feet: Secondary | ICD-10-CM

## 2015-10-20 NOTE — Therapy (Signed)
[facility] [address] [place], [place], [address] Phone: [phone]   Fax:  [phone]  Physical Therapy Treatment  Patient Details  Name: [name] MRN: [id number] Date of Birth: 1931/08/11 Referring Provider: Dr Delrae Rend  Encounter Date: 10/20/2015      PT End of Session - 10/20/15 1423    Visit Number 4   Number of Visits 12   Date for PT Re-Evaluation 11/04/15   PT Start Time [id number]   PT Stop Time 1320   PT Time Calculation (min) 45 min   Equipment Utilized During Treatment Gait belt   Activity Tolerance Patient tolerated treatment well   Behavior During Therapy [facility] for tasks assessed/performed      Past Medical History  Diagnosis Date  . Pacemaker 2011    St. Jude Accent DDDR ; this is the third generator this pt. has had  . Diabetes mellitus without complication ([place])   . Cancer ([place])   . Shingles   . GI bleed   . Gastric ulcer   . UTI (lower urinary tract infection)   . Blepharitis   . Hx of echocardiogram 08-15-2012    EF  45-50 % very difficult study, severe LVH, pacemaker, trace TR,mildly enlarged RV,; aorta mildly calcified  . History of stress test 06/06/2012    low risk scan. fixed inferior defect consistant with prior infarction with moderatly reduced EF. no appreciable evidence of ischemia. no significant change from previous study  . Abnormal Doppler ultrasound of carotid artery 12/16/2012    mildly abnormal doppler . no prior studies to compare to. follow up studies are recommended when clinically indicated.  . Exogenous obesity     Past Surgical History  Procedure Laterality Date  . Knee surgery Right   . Spine surgery    . Prostate biopsy    . Prostate seed implants    . Cardiac stents    . Wisdom tooth extraction    . Cataract extraction    . Cardiac catheterization  12-29-2003    rt. coronary angiography was done with a 6 french 4 cm taper coronary cath. This demonstrated   approximately 70 to 75% mildly segmental stenosis in the mid RCA before the acute margin          . Cardiac catheterization  11-19-2003    diagnostic cath,, pt. to get second cath to have stent placed    There were no vitals filed for this visit.      Subjective Assessment - 10/20/15 1242    Subjective Conitnues to have intermittant off balance episodes eg. this morning he was up talking to a group and went to turn and felt suddenly off balance. Had pain in LEFT ankle and knee after standing exercises last week. Finds HEP very tiring even just for a short time.   Patient Stated Goals improve balance and dizziness   Currently in Pain? No/denies                         [facility] Adult PT Treatment/Exercise - 10/20/15 0001    Knee/Hip Exercises: Aerobic   Stepper seated L 5.0 x 10 min             Balance Exercises - 10/20/15 1254    Balance Exercises: Standing   Standing Eyes Opened Narrow base of support (BOS)  Large step forwards on target, progressing with head movement, Min guard   SLS Eyes open  Alt.  toe taps; multidirectional, Min Guard.    Retro Gait Head turns  Narrow BOS in targets.   Sidestepping Other reps (comment)  With Alt. foot taps one cones.           PT Education - 10/20/15 1422    Education provided Yes   Education Details Encouraged pt to perform HEP more consistently.   Person(s) Educated Patient   Methods Explanation   Comprehension Verbalized understanding;Need further instruction          PT Short Term Goals - 10/14/15 1533    PT SHORT TERM GOAL #1   Title verbalize understanding of fall prevention strategies (10/14/15)   Status Achieved           PT Long Term Goals - 09/23/15 1634    PT LONG TERM GOAL #1   Title independent with HEP (11/04/15)   Time 6   Period Weeks   Status New   PT LONG TERM GOAL #2   Title improve tandem stand to > 15 sec bil for improved balance and mobility (11/04/15)   Time 6   Period Weeks    Status New   PT LONG TERM GOAL #3   Title perform SLS > 5 sec bil LE for improved function and balance (11/04/15)   Time 6   Period Weeks   Status New   PT LONG TERM GOAL #4   Title ambulate > 500' on various indoor/outdoor surfaces with LRAD modified independent for improved function and mobility (11/04/15)   Time 6   Period Weeks   Status New               Plan - 10/20/15 1310    Clinical Impression Statement Skilled session focused on standing Balance activites with narrow BOS, SLS, and progressing with heads turns; pt required Min guard and rest breaks due to fatigue.  Reports the LEs felt better after cardio exercise on Sci fit and is interested in equipment at the [facility]   PT Frequency 2x / week   PT Duration 6 weeks   PT Treatment/Interventions ADLs/Self Care Home Management;Canalith Repostioning;Moist Heat;Patient/family education;Neuromuscular re-education;Balance training;Therapeutic exercise;Therapeutic activities;Functional mobility training;Stair training;Gait training;Vestibular   PT Next Visit Plan Give information on Silver Sneakers; continue balance/strengthening/functional mobility      Patient will benefit from skilled therapeutic intervention in order to improve the following deficits and impairments:  Abnormal gait, Postural dysfunction, Dizziness, Decreased balance  Visit Diagnosis: Unsteadiness on feet  Dizziness and giddiness     Problem List Patient Active Problem List   Diagnosis Date Noted  . Chronic combined systolic and diastolic heart failure ([place]) 02/19/2015  . Sinus node dysfunction 03/09/2014  . Second degree AV block 03/09/2014  . Pacemaker - dual chamber St. Jude RF 07/24/2013  . Cardiomyopathy, ischemic 07/24/2013  . CAD (coronary artery disease) 07/24/2013  . OSA (obstructive sleep apnea) 07/24/2013  . CKD (chronic kidney disease) stage 3, GFR 30-59 ml/min 07/24/2013  . DM type 2 causing CKD stage 3 ([place]) 07/24/2013  . Mixed  hyperlipidemia 07/24/2013   [name], PTA  10/20/2015, 2:29 PM [facility] [address] [place], [place], [address] Phone: [phone]   Fax:  [phone]  Name: [name] MRN: [id number] Date of Birth: Mar 17, 1932

## 2015-10-21 ENCOUNTER — Ambulatory Visit: Payer: Medicare Other | Attending: Internal Medicine | Admitting: Physical Therapy

## 2015-10-21 ENCOUNTER — Encounter: Payer: Self-pay | Admitting: Physical Therapy

## 2015-10-21 DIAGNOSIS — R42 Dizziness and giddiness: Secondary | ICD-10-CM | POA: Diagnosis not present

## 2015-10-21 DIAGNOSIS — R2681 Unsteadiness on feet: Secondary | ICD-10-CM | POA: Diagnosis not present

## 2015-10-21 NOTE — Therapy (Signed)
[facility] [address] [place], [place], [address] Phone: [phone]   Fax:  [phone]  Physical Therapy Treatment  Patient Details  Name: [name] MRN: [id number] Date of Birth: July 22, 1931 Referring Provider: Dr Delrae Rend  Encounter Date: 10/21/2015      PT End of Session - 10/21/15 1440    Visit Number 7  corrected from last session   Number of Visits 12   Date for PT Re-Evaluation 11/04/15   PT Start Time 1406   PT Stop Time 1445   PT Time Calculation (min) 39 min   Equipment Utilized During Treatment Gait belt   Activity Tolerance Patient tolerated treatment well   Behavior During Therapy [facility] for tasks assessed/performed      Past Medical History  Diagnosis Date  . Pacemaker 2011    St. Jude Accent DDDR ; this is the third generator this pt. has had  . Diabetes mellitus without complication ([place])   . Cancer ([place])   . Shingles   . GI bleed   . Gastric ulcer   . UTI (lower urinary tract infection)   . Blepharitis   . Hx of echocardiogram 08-15-2012    EF  45-50 % very difficult study, severe LVH, pacemaker, trace TR,mildly enlarged RV,; aorta mildly calcified  . History of stress test 06/06/2012    low risk scan. fixed inferior defect consistant with prior infarction with moderatly reduced EF. no appreciable evidence of ischemia. no significant change from previous study  . Abnormal Doppler ultrasound of carotid artery 12/16/2012    mildly abnormal doppler . no prior studies to compare to. follow up studies are recommended when clinically indicated.  . Exogenous obesity     Past Surgical History  Procedure Laterality Date  . Knee surgery Right   . Spine surgery    . Prostate biopsy    . Prostate seed implants    . Cardiac stents    . Wisdom tooth extraction    . Cataract extraction    . Cardiac catheterization  12-29-2003    rt. coronary angiography was done with a 6 french 4 cm taper  coronary cath. This demonstrated  approximately 70 to 75% mildly segmental stenosis in the mid RCA before the acute margin          . Cardiac catheterization  11-19-2003    diagnostic cath,, pt. to get second cath to have stent placed    There were no vitals filed for this visit.      Subjective Assessment - 10/21/15 1411    Subjective "Not so good today." Reports having vertigo this morning. First time it was this bad in awhile. Does not have vertigo now.   Patient Stated Goals improve balance and dizziness   Currently in Pain? No/denies                         [facility] Adult PT Treatment/Exercise - 10/21/15 0001    Ambulation/Gait   Ambulation/Gait Yes   Ambulation/Gait Assistance 5: Supervision   Ambulation/Gait Assistance Details Working on balance on multisurfaces progressing with heads turns   Ambulation Distance (Feet) 500 Feet   Assistive device None   Gait Pattern Wide base of support;Trendelenburg   Ambulation Surface Level;Unlevel;Indoor;Outdoor;Paved;Gravel;Grass   Knee/Hip Exercises: Aerobic   Stepper seated L 4.0 x 10 min   Knee/Hip Exercises: Supine   Bridges   Standing hip ADB 20 reps  multiple stops to stretch hamstrings  due to cramping  x10 each            PT Education - 10/21/15 1553    Education provided Yes   Education Details Discussed with pt that PT will plan to reassess vertigo due to recent symtoms   Person(s) Educated Patient   Methods Explanation   Comprehension Verbalized understanding          PT Short Term Goals - 10/14/15 1533    PT SHORT TERM GOAL #1   Title verbalize understanding of fall prevention strategies (10/14/15)   Status Achieved           PT Long Term Goals - 09/23/15 1634    PT LONG TERM GOAL #1   Title independent with HEP (11/04/15)   Time 6   Period Weeks   Status New   PT LONG TERM GOAL #2   Title improve tandem stand to > 15 sec bil for improved balance and mobility (11/04/15)   Time 6    Period Weeks   Status New   PT LONG TERM GOAL #3   Title perform SLS > 5 sec bil LE for improved function and balance (11/04/15)   Time 6   Period Weeks   Status New   PT LONG TERM GOAL #4   Title ambulate > 500' on various indoor/outdoor surfaces with LRAD modified independent for improved function and mobility (11/04/15)   Time 6   Period Weeks   Status New               Plan - 10/21/15 1554    Clinical Impression Statement Pt continues to report feeling a lot of sway when walking; noted continued Trendelenburg gait and reviewed hip strengthening exercises in current HEP. Reports recent bout of vertigo this morning. Pt states that he is fine when he's up moving but  when he's at rest he feels the vertigo which he has had for years.   Agrees to see PT to reassess vertigo symptoms.   PT Frequency 2x / week   PT Duration 6 weeks   PT Treatment/Interventions ADLs/Self Care Home Management;Canalith Repostioning;Moist Heat;Patient/family education;Neuromuscular re-education;Balance training;Therapeutic exercise;Therapeutic activities;Functional mobility training;Stair training;Gait training;Vestibular   PT Next Visit Plan Reasses vertigo; Discuss POC; Give information on Silver Sneakers; continue balance/strengthening/functional mobility      Patient will benefit from skilled therapeutic intervention in order to improve the following deficits and impairments:  Abnormal gait, Postural dysfunction, Dizziness, Decreased balance  Visit Diagnosis: Unsteadiness on feet  Dizziness and giddiness     Problem List Patient Active Problem List   Diagnosis Date Noted  . Chronic combined systolic and diastolic heart failure ([place]) 02/19/2015  . Sinus node dysfunction 03/09/2014  . Second degree AV block 03/09/2014  . Pacemaker - dual chamber St. Jude RF 07/24/2013  . Cardiomyopathy, ischemic 07/24/2013  . CAD (coronary artery disease) 07/24/2013  . OSA (obstructive sleep apnea)  07/24/2013  . CKD (chronic kidney disease) stage 3, GFR 30-59 ml/min 07/24/2013  . DM type 2 causing CKD stage 3 ([place]) 07/24/2013  . Mixed hyperlipidemia 07/24/2013    Bjorn Loser, PTA  10/21/2015, 4:04 PM [facility] [address] [place], [place], [address] Phone: [phone]   Fax:  [phone]  Name: [name] MRN: [id number] Date of Birth: 1931/12/16

## 2015-10-25 ENCOUNTER — Ambulatory Visit: Payer: Medicare Other | Admitting: Rehabilitative and Restorative Service Providers"

## 2015-10-25 DIAGNOSIS — R2681 Unsteadiness on feet: Secondary | ICD-10-CM

## 2015-10-25 DIAGNOSIS — R42 Dizziness and giddiness: Secondary | ICD-10-CM | POA: Diagnosis not present

## 2015-10-25 NOTE — Therapy (Signed)
[facility] [address] [place], [place], [address] Phone: [phone]   Fax:  [phone]  Physical Therapy Treatment  Patient Details  Name: [name] MRN: [id number] Date of Birth: 1932/05/22 Referring Provider: Dr Delrae Rend  Encounter Date: 10/25/2015      PT End of Session - 10/25/15 2002    Visit Number 8   Number of Visits 12   Date for PT Re-Evaluation 11/04/15   Authorization Type G code every 10th visit   PT Start Time 1150   PT Stop Time 1230   PT Time Calculation (min) 40 min   Equipment Utilized During Treatment Gait belt   Activity Tolerance Patient tolerated treatment well   Behavior During Therapy [facility] for tasks assessed/performed      Past Medical History  Diagnosis Date  . Pacemaker 2011    St. Jude Accent DDDR ; this is the third generator this pt. has had  . Diabetes mellitus without complication ([place])   . Cancer ([place])   . Shingles   . GI bleed   . Gastric ulcer   . UTI (lower urinary tract infection)   . Blepharitis   . Hx of echocardiogram 08-15-2012    EF  45-50 % very difficult study, severe LVH, pacemaker, trace TR,mildly enlarged RV,; aorta mildly calcified  . History of stress test 06/06/2012    low risk scan. fixed inferior defect consistant with prior infarction with moderatly reduced EF. no appreciable evidence of ischemia. no significant change from previous study  . Abnormal Doppler ultrasound of carotid artery 12/16/2012    mildly abnormal doppler . no prior studies to compare to. follow up studies are recommended when clinically indicated.  . Exogenous obesity     Past Surgical History  Procedure Laterality Date  . Knee surgery Right   . Spine surgery    . Prostate biopsy    . Prostate seed implants    . Cardiac stents    . Wisdom tooth extraction    . Cataract extraction    . Cardiac catheterization  12-29-2003    rt. coronary angiography was done with a 6 french  4 cm taper coronary cath. This demonstrated  approximately 70 to 75% mildly segmental stenosis in the mid RCA before the acute margin          . Cardiac catheterization  11-19-2003    diagnostic cath,, pt. to get second cath to have stent placed    There were no vitals filed for this visit.      Subjective Assessment - 10/25/15 1144    Subjective The patient reports that his main complaint is general unsteadiness.  He notices room spinning vertigo approximately once per month that lasts for minutes.  He doesn't get nausea, just continues with imbalance.   Sensation of spinning usually more associated following sedentary period.     Patient Stated Goals improve balance and dizziness   Currently in Pain? No/denies          Vestibular Assessment - 10/25/15 1152    Positional Testing   Sidelying Test Sidelying Right;Sidelying Left   Horizontal Canal Testing Horizontal Canal Right;Horizontal Canal Left   Sidelying Right   Sidelying Right Duration none   Sidelying Right Symptoms No nystagmus   Sidelying Left   Sidelying Left Duration none   Sidelying Left Symptoms No nystagmus   Horizontal Canal Right   Horizontal Canal Right Duration none   Horizontal Canal Right Symptoms Normal  Horizontal Canal Left   Horizontal Canal Left Duration none   Horizontal Canal Left Symptoms Normal      NEUROMUSCULAR RE-EDUCATION: Gaze x 1 viewing in standing with verbal cues to maintain eye fixation to target Alternating LE foot taps to 6" step Step up/downs to 6" step without UE support x 5 reps each side Sidestepping adding head motion to increase challenge of task  Gait: Heel walking, toe walking x 50 feet alternating ankle position with SBA to CGA Gait forward/backwards for dynamic challenge with SBA Ambulation on level surfaces without a device x 350 feet with cues on bilateral heel strike Marching tasks with SBA  SELF CARE/HOME MANAGEMENT: Discussed current HEP, gym routine and  community options. PT provided information re: PREP (physician referred exercise program) class at [facility]       PT Education - 10/25/15 2001    Education provided Yes   Education Details Recommended post d/c gym routine and PT obtained permission to contact [employer] [facility] [employer] ([facility] RN) for exercise program post d/c   Person(s) Educated Patient   Methods Explanation;Demonstration;Handout   Comprehension Verbalized understanding;Returned demonstration          PT Short Term Goals - 10/14/15 1533    PT SHORT TERM GOAL #1   Title verbalize understanding of fall prevention strategies (10/14/15)   Status Achieved           PT Long Term Goals - 10/25/15 2002    PT LONG TERM GOAL #1   Title independent with HEP (11/04/15)   Time 6   Period Weeks   Status New   PT LONG TERM GOAL #2   Title improve tandem stand to > 15 sec bil for improved balance and mobility (11/04/15)   Time 6   Period Weeks   Status New   PT LONG TERM GOAL #3   Title perform SLS > 5 sec bil LE for improved function and balance (11/04/15)   Time 6   Period Weeks   Status New   PT LONG TERM GOAL #4   Title ambulate > 500' on various indoor/outdoor surfaces with LRAD modified independent for improved function and mobility (11/04/15)   Time [facility] - 10/25/15 2003    Clinical Impression Statement The patient was reassessed for BPPV and did not have nystagmus or dizziness with positional testing.  He has been instructed in HEP for multi-sensory balance training, however he reports he does not perform regularly.  He is interested in participating in community exercise program,--PT discussed the benefit to specific/individualized balance exercises in addition to cardio and strengthening.  Patient verbalizes understanding.  PT contacted community nurse at  [facility] for supervised exercise program for patient to initiiate community program.  Plan to d/c  next visit--check goals.    PT Treatment/Interventions ADLs/Self Care Home Management;Canalith Repostioning;Moist Heat;Patient/family education;Neuromuscular re-education;Balance training;Therapeutic exercise;Therapeutic activities;Functional mobility training;Stair training;Gait training;Vestibular   PT Next Visit Plan Check LTGs, review HEP, discharge--check to see if FOTO completed.   Consulted and Agree with Plan of Care Patient      Patient will benefit from skilled therapeutic intervention in order to improve the following deficits and impairments:  Abnormal gait, Postural dysfunction, Dizziness, Decreased balance  Visit Diagnosis: Unsteadiness on feet  Dizziness and giddiness     Problem List Patient Active Problem List   Diagnosis Date  Noted  . Chronic combined systolic and diastolic heart failure ([place]) 02/19/2015  . Sinus node dysfunction 03/09/2014  . Second degree AV block 03/09/2014  . Pacemaker - dual chamber St. Jude RF 07/24/2013  . Cardiomyopathy, ischemic 07/24/2013  . CAD (coronary artery disease) 07/24/2013  . OSA (obstructive sleep apnea) 07/24/2013  . CKD (chronic kidney disease) stage 3, GFR 30-59 ml/min 07/24/2013  . DM type 2 causing CKD stage 3 ([place]) 07/24/2013  . Mixed hyperlipidemia 07/24/2013    Twyla Dais, PT 10/25/2015, 8:07 PM  [facility] [address] [place], [place], [address] Phone: [phone]   Fax:  [phone]  Name: Grisham Fairley MRN: [id number] Date of Birth: [birth year]

## 2015-10-28 ENCOUNTER — Encounter: Payer: Self-pay | Admitting: Physical Therapy

## 2015-10-28 ENCOUNTER — Ambulatory Visit: Payer: Medicare Other | Admitting: Physical Therapy

## 2015-10-28 DIAGNOSIS — R2681 Unsteadiness on feet: Secondary | ICD-10-CM

## 2015-10-28 DIAGNOSIS — R42 Dizziness and giddiness: Secondary | ICD-10-CM

## 2015-10-28 NOTE — Therapy (Signed)
[facility] [address] [place], [place], [address] Phone: [phone]   Fax:  [phone]  Physical Therapy Treatment  Patient Details  Name: [name] MRN: [id number] Date of Birth: 09-14-1931 Referring Provider: Dr Delrae Rend  Encounter Date: 10/28/2015      PT End of Session - 10/28/15 1446    Visit Number 9   Number of Visits 12   Date for PT Re-Evaluation 11/04/15   Authorization Type G code every 10th visit   PT Start Time 1405   PT Stop Time 1445   PT Time Calculation (min) 40 min   Equipment Utilized During Treatment Gait belt   Activity Tolerance Patient tolerated treatment well   Behavior During Therapy [facility] for tasks assessed/performed      Past Medical History  Diagnosis Date  . Pacemaker 2011    St. Jude Accent DDDR ; this is the third generator this pt. has had  . Diabetes mellitus without complication ([place])   . Cancer ([place])   . Shingles   . GI bleed   . Gastric ulcer   . UTI (lower urinary tract infection)   . Blepharitis   . Hx of echocardiogram 08-15-2012    EF  45-50 % very difficult study, severe LVH, pacemaker, trace TR,mildly enlarged RV,; aorta mildly calcified  . History of stress test 06/06/2012    low risk scan. fixed inferior defect consistant with prior infarction with moderatly reduced EF. no appreciable evidence of ischemia. no significant change from previous study  . Abnormal Doppler ultrasound of carotid artery 12/16/2012    mildly abnormal doppler . no prior studies to compare to. follow up studies are recommended when clinically indicated.  . Exogenous obesity     Past Surgical History  Procedure Laterality Date  . Knee surgery Right   . Spine surgery    . Prostate biopsy    . Prostate seed implants    . Cardiac stents    . Wisdom tooth extraction    . Cataract extraction    . Cardiac catheterization  12-29-2003    rt. coronary angiography was done with a 6 french  4 cm taper coronary cath. This demonstrated  approximately 70 to 75% mildly segmental stenosis in the mid RCA before the acute margin          . Cardiac catheterization  11-19-2003    diagnostic cath,, pt. to get second cath to have stent placed    There were no vitals filed for this visit.      Subjective Assessment - 10/28/15 1408    Subjective Pt has an appointment Monday at [facility] with RN. "I've been trying to walk more energetically." Walking on trails in woods with accompaniment. Has notice an overall improvement with mobility.   Patient Stated Goals improve balance and dizziness   Currently in Pain? No/denies                         [facility] Adult PT Treatment/Exercise - 10/28/15 0001    Ambulation/Gait   Ambulation/Gait Yes   Ambulation/Gait Assistance 6: Modified independent (Device/Increase time)   Ambulation/Gait Assistance Details Working on balance on various surfaces  No LOB   Ambulation Distance (Feet) 500 Feet   Assistive device None   Gait Pattern Step-through pattern   Ambulation Surface Unlevel;Indoor;Outdoor;Level;Paved;Grass;Gravel  good foot clearance and confident movement, although fatigued at end of walk.  Balance Exercises - 10/28/15 1438    Balance Exercises: Standing   Tandem Stance Eyes open;3 reps;15 secs   SLS Eyes open;3 reps;10 secs           PT Education - 10/28/15 1412    Education provided Yes   Education Details Encouraged pt with walking program, but warned about fall risk in walking on uneven trails. Reports incorporating HEP throughout tasks throughout the day. Reviewed results of goals checked.   Person(s) Educated Patient   Methods Explanation   Comprehension Verbalized understanding          PT Short Term Goals - 10/14/15 1533    PT SHORT TERM GOAL #1   Title verbalize understanding of fall prevention strategies (10/14/15)   Status Achieved           PT Long Term Goals - 10/28/15 1418     PT LONG TERM GOAL #1   Title independent with HEP (11/04/15)   Baseline Reports incorporating HEP throughout tasks throughout the day. 10/28/15   Time 6   Period Weeks   Status Achieved   PT LONG TERM GOAL #2   Title improve tandem stand to > 15 sec bil for improved balance and mobility (11/04/15)   Baseline Left foot in back >15 sec, Right foot in back 12 sec   Time 6   Period Weeks   Status Partially Met   PT LONG TERM GOAL #3   Title perform SLS > 5 sec bil LE for improved function and balance (11/04/15)   Baseline Right foot  >5 ; Left foot 4sec ; 10/28/15      Time 6   Period Weeks   Status Partially Met   PT LONG TERM GOAL #4   Title ambulate > 500' on various indoor/outdoor surfaces with LRAD modified independent for improved function and mobility (11/04/15)   Baseline Met 10/28/15   Time 6   Period Weeks   Status Achieved               Plan - 10/28/15 1447    Clinical Impression Statement Pt is agreeable to d/c this visit.  Pt has made some functional gains with mobility as demonstrated with goals met and progressing from 56 to 58 on Functional Status Measure. Pt's continued fitness plan is to meet with a trainer at [facility].   PT Treatment/Interventions ADLs/Self Care Home Management;Canalith Repostioning;Moist Heat;Patient/family education;Neuromuscular re-education;Balance training;Therapeutic exercise;Therapeutic activities;Functional mobility training;Stair training;Gait training;Vestibular   PT Next Visit Plan send d/c summary to MD.   Consulted and Agree with Plan of Care Patient      Patient will benefit from skilled therapeutic intervention in order to improve the following deficits and impairments:  Abnormal gait, Postural dysfunction, Dizziness, Decreased balance  Visit Diagnosis: Unsteadiness on feet  Dizziness and giddiness     Problem List Patient Active Problem List   Diagnosis Date Noted  . Chronic combined systolic and diastolic heart failure ([place])  02/19/2015  . Sinus node dysfunction 03/09/2014  . Second degree AV block 03/09/2014  . Pacemaker - dual chamber St. Jude RF 07/24/2013  . Cardiomyopathy, ischemic 07/24/2013  . CAD (coronary artery disease) 07/24/2013  . OSA (obstructive sleep apnea) 07/24/2013  . CKD (chronic kidney disease) stage 3, GFR 30-59 ml/min 07/24/2013  . DM type 2 causing CKD stage 3 ([place]) 07/24/2013  . Mixed hyperlipidemia 07/24/2013    Bjorn Loser, PTA  10/28/2015, 4:01 PM [facility] [address]  [place], [place], [address] Phone: [phone]   Fax:  [phone]  Name: Evart Mcdonnell MRN: [id number] Date of Birth: 1931-08-07

## 2015-11-01 ENCOUNTER — Encounter: Payer: Self-pay | Admitting: Rehabilitative and Restorative Service Providers"

## 2015-11-01 NOTE — Therapy (Signed)
[facility] [address] [place], [place], [address] Phone: [phone]   Fax:  [phone]  Patient Details  Name: [name] MRN: [id number] Date of Birth: 1932/01/09 Referring Provider:  No ref. provider found  Encounter Date: last encounter 10/28/2015  PHYSICAL THERAPY DISCHARGE SUMMARY  Visits from Start of Care: 9   Current functional level related to goals / functional outcomes:     PT Short Term Goals - 10/14/15 1533    PT SHORT TERM GOAL #1   Title verbalize understanding of fall prevention strategies (10/14/15)   Status Achieved         PT Long Term Goals - 10/28/15 1418    PT LONG TERM GOAL #1   Title independent with HEP (11/04/15)   Baseline Reports incorporating HEP throughout tasks throughout the day. 10/28/15   Time 6   Period Weeks   Status Achieved   PT LONG TERM GOAL #2   Title improve tandem stand to > 15 sec bil for improved balance and mobility (11/04/15)   Baseline Left foot in back >15 sec, Right foot in back 12 sec   Time 6   Period Weeks   Status Partially Met   PT LONG TERM GOAL #3   Title perform SLS > 5 sec bil LE for improved function and balance (11/04/15)   Baseline Right foot  >5 ; Left foot 4sec ; 10/28/15      Time 6   Period Weeks   Status Partially Met   PT LONG TERM GOAL #4   Title ambulate > 500' on various indoor/outdoor surfaces with LRAD modified independent for improved function and mobility (11/04/15)   Baseline Met 10/28/15   Time 6   Period Weeks   Status Achieved        Remaining deficits: Patient to transition to community wellness to continue addressing strength and high level balance deficits.   Education / Equipment: HEP, general conditioning, [employer] PREP program.  Plan: Patient agrees to discharge.  Patient goals were partially met. Patient is being discharged due to meeting the stated rehab goals.  ?????       Thank you for the referral of this  patient. Rudell Cobb, MPT    [name] 11/01/2015, 7:55 PM  [facility] [address] [place], [place], [address] Phone: [phone]   Fax:  [phone]

## 2015-11-05 ENCOUNTER — Ambulatory Visit (INDEPENDENT_AMBULATORY_CARE_PROVIDER_SITE_OTHER): Payer: Medicare Other | Admitting: Podiatry

## 2015-11-05 ENCOUNTER — Encounter: Payer: Self-pay | Admitting: Podiatry

## 2015-11-05 DIAGNOSIS — M79676 Pain in unspecified toe(s): Secondary | ICD-10-CM | POA: Diagnosis not present

## 2015-11-05 DIAGNOSIS — M79609 Pain in unspecified limb: Principal | ICD-10-CM

## 2015-11-05 DIAGNOSIS — E1151 Type 2 diabetes mellitus with diabetic peripheral angiopathy without gangrene: Secondary | ICD-10-CM

## 2015-11-05 DIAGNOSIS — B351 Tinea unguium: Secondary | ICD-10-CM

## 2015-11-05 NOTE — Progress Notes (Signed)
PatComplaint:  Visit Type: Patient returns to my office for continued preventative foot care services. Complaint: Patient states" my nails have grown long and thick and become painful to walk and wear shoes" Patient has been diagnosed with DM with no complications. He presents for preventative foot care services. No changes to ROS  Podiatric Exam: Vascular: dorsalis pedis and posterior tibial pulses are not  palpable bilateral. Capillary return is immediate. Temperature gradient is WNL. Skin turgor WNL  Sensorium: Diminished l Semmes Weinstein monofilament test. Normal tactile sensation bilaterally. Nail Exam: Pt has thick disfigured discolored nails with subungual debris noted bilateral entire nail hallux through fifth toenails Ulcer Exam: There is no evidence of ulcer or pre-ulcerative changes or infection. Orthopedic Exam: Muscle tone and strength are WNL. No limitations in general ROM. No crepitus or effusions noted. Foot type and digits show no abnormalities. Bony prominences are unremarkable. Skin: No Porokeratosis. No infection or ulcers  Diagnosis:  Tinea unguium, Pain in right toe, pain in left toes  Treatment & Plan Procedures and Treatment: Consent by patient was obtained for treatment procedures. The patient understood the discussion of treatment and procedures well. All questions were answered thoroughly reviewed. Debridement of mycotic and hypertrophic toenails, 1 through 5 bilateral and clearing of subungual debris. No ulceration, no infection noted.  Return Visit-Office Procedure: Patient instructed to return to the office for a follow up visit 3 months for continued evaluation and treatment.ient ID: [name], male   DOB: [birth year], 82 y.o.   MRN: [id number]   [name] DPM

## 2015-11-24 ENCOUNTER — Telehealth: Payer: Self-pay | Admitting: Cardiovascular Disease

## 2015-11-24 ENCOUNTER — Ambulatory Visit (INDEPENDENT_AMBULATORY_CARE_PROVIDER_SITE_OTHER): Payer: Medicare Other | Admitting: *Deleted

## 2015-11-24 DIAGNOSIS — I495 Sick sinus syndrome: Secondary | ICD-10-CM | POA: Diagnosis not present

## 2015-11-24 NOTE — Telephone Encounter (Signed)
New Message  Pt calling to see if remote transmission was sent successfully. Please call back and discuss.   

## 2015-11-24 NOTE — Progress Notes (Signed)
Remote pacemaker transmission.   

## 2015-11-24 NOTE — Telephone Encounter (Signed)
Informed pt that we did receive his transmission. Pt verbalized understanding.

## 2015-11-30 LAB — CUP PACEART REMOTE DEVICE CHECK
Battery Remaining Longevity: 85 mo
Battery Remaining Percentage: 73 %
Battery Voltage: 2.92 V
Brady Statistic AP VP Percent: 12 %
Brady Statistic AP VS Percent: 32 %
Brady Statistic AS VP Percent: 14 %
Brady Statistic AS VS Percent: 41 %
Brady Statistic RA Percent Paced: 44 %
Brady Statistic RV Percent Paced: 27 %
Date Time Interrogation Session: 20170705061827
Implantable Lead Implant Date: 19901207
Implantable Lead Implant Date: 20111110
Implantable Lead Location: 753862
Implantable Lead Location: 753862
Lead Channel Impedance Value: 300 Ohm
Lead Channel Impedance Value: 540 Ohm
Lead Channel Pacing Threshold Amplitude: 0.75 V
Lead Channel Pacing Threshold Amplitude: 1.25 V
Lead Channel Pacing Threshold Pulse Width: 0.4 ms
Lead Channel Pacing Threshold Pulse Width: 0.6 ms
Lead Channel Sensing Intrinsic Amplitude: 4.2 mV
Lead Channel Sensing Intrinsic Amplitude: 8.5 mV
Lead Channel Setting Pacing Amplitude: 1.5 V
Lead Channel Setting Pacing Amplitude: 1.75 V
Lead Channel Setting Pacing Pulse Width: 0.6 ms
Lead Channel Setting Sensing Sensitivity: 3 mV
Pulse Gen Model: 2210
Pulse Gen Serial Number: 7174903

## 2015-12-01 ENCOUNTER — Encounter: Payer: Self-pay | Admitting: Cardiology

## 2015-12-02 DIAGNOSIS — Z794 Long term (current) use of insulin: Secondary | ICD-10-CM | POA: Diagnosis not present

## 2015-12-02 DIAGNOSIS — E039 Hypothyroidism, unspecified: Secondary | ICD-10-CM | POA: Diagnosis not present

## 2015-12-02 DIAGNOSIS — R42 Dizziness and giddiness: Secondary | ICD-10-CM | POA: Diagnosis not present

## 2015-12-02 DIAGNOSIS — E1142 Type 2 diabetes mellitus with diabetic polyneuropathy: Secondary | ICD-10-CM | POA: Diagnosis not present

## 2015-12-02 DIAGNOSIS — Z79899 Other long term (current) drug therapy: Secondary | ICD-10-CM | POA: Diagnosis not present

## 2015-12-14 DIAGNOSIS — E1142 Type 2 diabetes mellitus with diabetic polyneuropathy: Secondary | ICD-10-CM | POA: Diagnosis not present

## 2015-12-14 DIAGNOSIS — E039 Hypothyroidism, unspecified: Secondary | ICD-10-CM | POA: Diagnosis not present

## 2015-12-14 DIAGNOSIS — Z6841 Body Mass Index (BMI) 40.0 and over, adult: Secondary | ICD-10-CM | POA: Diagnosis not present

## 2015-12-14 DIAGNOSIS — Z794 Long term (current) use of insulin: Secondary | ICD-10-CM | POA: Diagnosis not present

## 2015-12-14 DIAGNOSIS — N183 Chronic kidney disease, stage 3 (moderate): Secondary | ICD-10-CM | POA: Diagnosis not present

## 2016-01-15 ENCOUNTER — Other Ambulatory Visit: Payer: Self-pay | Admitting: Cardiovascular Disease

## 2016-01-17 NOTE — Telephone Encounter (Signed)
Rx request sent to pharmacy.  

## 2016-01-18 ENCOUNTER — Encounter: Payer: Medicare Other | Admitting: Podiatry

## 2016-01-19 ENCOUNTER — Encounter: Payer: Self-pay | Admitting: Podiatry

## 2016-01-19 ENCOUNTER — Ambulatory Visit (INDEPENDENT_AMBULATORY_CARE_PROVIDER_SITE_OTHER): Payer: Medicare Other

## 2016-01-19 ENCOUNTER — Ambulatory Visit (INDEPENDENT_AMBULATORY_CARE_PROVIDER_SITE_OTHER): Payer: Medicare Other | Admitting: Podiatry

## 2016-01-19 VITALS — BP 173/82 | HR 73 | Resp 16

## 2016-01-19 DIAGNOSIS — M7750 Other enthesopathy of unspecified foot: Secondary | ICD-10-CM

## 2016-01-19 DIAGNOSIS — M7662 Achilles tendinitis, left leg: Secondary | ICD-10-CM

## 2016-01-19 DIAGNOSIS — M79672 Pain in left foot: Secondary | ICD-10-CM | POA: Diagnosis not present

## 2016-01-19 MED ORDER — TRIAMCINOLONE ACETONIDE 10 MG/ML IJ SUSP
10.0000 mg | Freq: Once | INTRAMUSCULAR | Status: AC
Start: 2016-01-19 — End: 2016-01-19
  Administered 2016-01-19: 10 mg

## 2016-01-19 NOTE — Patient Instructions (Signed)

## 2016-01-19 NOTE — Progress Notes (Signed)
This encounter was created in error - please disregard.

## 2016-01-20 NOTE — Progress Notes (Signed)
Subjective:     Patient ID: [name], male   DOB: 04-22-1932, 82 y.o.   MRN: [id number]  HPI patient presents stating he has developed pain in the back of his heel and he's been trying ice and stretching without relief and he had taken a trip to [place] and it started after this   Review of Systems     Objective:   Physical Exam Neurovascular status intact muscle strength adequate inflammatory changes posterior aspect left heel medial side that's tender with localized pain and no indication of central or lateral band inflammation or pain    Assessment:     Localized Achilles tendinitis left    Plan:     Careful medial injection administered 3 mg dexamethasone Kenalog 5 mg Xylocaine and advised on stretching exercises ice therapy and reduced activity and did discuss with him prior to injection the possibilities for rupture  X-ray report indicated small spur with no indications of stress fracture or advanced arthritis

## 2016-02-04 ENCOUNTER — Ambulatory Visit (INDEPENDENT_AMBULATORY_CARE_PROVIDER_SITE_OTHER): Payer: Medicare Other | Admitting: Podiatry

## 2016-02-04 ENCOUNTER — Encounter: Payer: Self-pay | Admitting: Podiatry

## 2016-02-04 VITALS — Ht 67.0 in | Wt 270.0 lb

## 2016-02-04 DIAGNOSIS — B351 Tinea unguium: Secondary | ICD-10-CM

## 2016-02-04 DIAGNOSIS — M79676 Pain in unspecified toe(s): Secondary | ICD-10-CM | POA: Diagnosis not present

## 2016-02-04 DIAGNOSIS — Z794 Long term (current) use of insulin: Secondary | ICD-10-CM

## 2016-02-04 DIAGNOSIS — E1140 Type 2 diabetes mellitus with diabetic neuropathy, unspecified: Secondary | ICD-10-CM

## 2016-02-04 NOTE — Progress Notes (Signed)
PatComplaint:  Visit Type: Patient returns to my office for continued preventative foot care services. Complaint: Patient states" my nails have grown long and thick and become painful to walk and wear shoes" Patient has been diagnosed with DM with no complications. He presents for preventative foot care services. No changes to ROS  Podiatric Exam: Vascular: dorsalis pedis and posterior tibial pulses are not  palpable bilateral. Capillary return is immediate. Temperature gradient is WNL. Skin turgor WNL  Sensorium: Diminished l Semmes Weinstein monofilament test. Normal tactile sensation bilaterally. Nail Exam: Pt has thick disfigured discolored nails with subungual debris noted bilateral entire nail hallux through fifth toenails Ulcer Exam: There is no evidence of ulcer or pre-ulcerative changes or infection. Orthopedic Exam: Muscle tone and strength are WNL. No limitations in general ROM. No crepitus or effusions noted. Foot type and digits show no abnormalities. Bony prominences are unremarkable. Skin: No Porokeratosis. No infection or ulcers  Diagnosis:  Tinea unguium, Pain in right toe, pain in left toes  Treatment & Plan Procedures and Treatment: Consent by patient was obtained for treatment procedures. The patient understood the discussion of treatment and procedures well. All questions were answered thoroughly reviewed. Debridement of mycotic and hypertrophic toenails, 1 through 5 bilateral and clearing of subungual debris. No ulceration, no infection noted.  Return Visit-Office Procedure: Patient instructed to return to the office for a follow up visit 3 months for continued evaluation and treatment.ient ID: [name], male   DOB: 08-03-1931, 82 y.o.   MRN: [id number]   [name] DPM

## 2016-02-18 ENCOUNTER — Encounter: Payer: Self-pay | Admitting: Cardiovascular Disease

## 2016-02-18 ENCOUNTER — Ambulatory Visit (INDEPENDENT_AMBULATORY_CARE_PROVIDER_SITE_OTHER): Payer: Medicare Other | Admitting: Cardiovascular Disease

## 2016-02-18 VITALS — BP 129/79 | HR 91 | Ht 67.0 in | Wt 260.0 lb

## 2016-02-18 DIAGNOSIS — N183 Chronic kidney disease, stage 3 unspecified: Secondary | ICD-10-CM

## 2016-02-18 DIAGNOSIS — I5042 Chronic combined systolic (congestive) and diastolic (congestive) heart failure: Secondary | ICD-10-CM

## 2016-02-18 DIAGNOSIS — G4733 Obstructive sleep apnea (adult) (pediatric): Secondary | ICD-10-CM

## 2016-02-18 DIAGNOSIS — E782 Mixed hyperlipidemia: Secondary | ICD-10-CM

## 2016-02-18 DIAGNOSIS — I441 Atrioventricular block, second degree: Secondary | ICD-10-CM

## 2016-02-18 DIAGNOSIS — Z950 Presence of cardiac pacemaker: Secondary | ICD-10-CM | POA: Diagnosis not present

## 2016-02-18 DIAGNOSIS — I495 Sick sinus syndrome: Secondary | ICD-10-CM | POA: Diagnosis not present

## 2016-02-18 DIAGNOSIS — I2510 Atherosclerotic heart disease of native coronary artery without angina pectoris: Secondary | ICD-10-CM | POA: Diagnosis not present

## 2016-02-18 DIAGNOSIS — Z794 Long term (current) use of insulin: Secondary | ICD-10-CM

## 2016-02-18 DIAGNOSIS — E1122 Type 2 diabetes mellitus with diabetic chronic kidney disease: Secondary | ICD-10-CM

## 2016-02-18 HISTORY — DX: Morbid (severe) obesity due to excess calories: E66.01

## 2016-02-18 NOTE — Patient Instructions (Signed)
Dr Sallyanne Kuster recommends that you continue on your current medications as directed. Please refer to the Current Medication list given to you today.  Remote monitoring is used to monitor your Pacemaker of ICD from home. This monitoring reduces the number of office visits required to check your device to one time per year. It allows [place] to keep an eye on the functioning of your device to ensure it is working properly. You are scheduled for a device check from home on Friday, December 29th, 2017. You may send your transmission at any time that day. If you have a wireless device, the transmission will be sent automatically. After your physician reviews your transmission, you will receive a postcard with your next transmission date.  Dr Sallyanne Kuster recommends that you schedule a follow-up appointment in 12 months with a device check. You will receive a reminder letter in the mail two months in advance. If you don't receive a letter, please call our office to schedule the follow-up appointment.  If you need a refill on your cardiac medications before your next appointment, please call your pharmacy.

## 2016-02-18 NOTE — Progress Notes (Signed)
Cardiology Office Note    Date:  02/18/2016   ID:  [name], DOB 1931-09-30, MRN [id number]  PCP:  Lujean Amel, MD  Cardiologist:   Sanda Klein, MD   Chief Complaint  Patient presents with  . Follow-up    pacer check, bp meds were cut in half my his pcp for dizziness and low bp after exercising    History of Present Illness:  [name] is a 82 y.o. 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 The device was initially implanted in 1990, with generator change at 7 2002 in 2011. Additional problems include minor nonobstructive coronary atherosclerosis, insulin requiring type 2 diabetes mellitus, hyperlipidemia, hypertension in the setting of morbid obesity.  He denies problems with chest pain or shortness of breath or leg edema. He had some dizziness related to low blood pressure. His dose of losartan was reduced in half since his last appointment. He complains of multiple musculoskeletal problems especially pain in his knees and numbness and paresthesias in his fingers if he keeps his arms elevated for long appears of time, such as when he is working on the computer.  Pacemaker interrogation shows normal device function. Estimated generator longevity is 6-7 0.5 years, lead parameters are all excellent. There is 48% atrial pacing and 28% ventricular pacing. There have been no episodes of high ventricular rate. 3 episodes of mode switch are extremely brief. He does not have atrial fibrillation.  He underwent staged LAD and RCA revascularization with drug-eluting stents in 2005 and has not required subsequent percutaneous revascularization procedures.  His most recent nuclear study in January 2014 showed an ejection of 34% with a fixed inferior defect and no reversible ischemia. I'm not sure about the accuracy of left ventricular ejection fraction assessment. His most recent echocardiogram performed around the  same date showed a mildly depressed LVEF of 45-50% (note the presence of right ventricular pacing which may alter the accuracy of EF assessment). 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 He is now retired and is an avid Geophysicist/field seismologist : He mostly likes to photograph buildings and has an exhibit coming up at [employer].  Past Medical History:  Diagnosis Date  . Abnormal Doppler ultrasound of carotid artery 12/16/2012   mildly abnormal doppler . no prior studies to compare to. follow up studies are recommended when clinically indicated.  Marland Kitchen Blepharitis   . Cancer ([place])   . Diabetes mellitus without complication ([place])   . Exogenous obesity   . Gastric ulcer   . GI bleed   . History of stress test 06/06/2012   low risk scan. fixed inferior defect consistant with prior infarction with moderatly reduced EF. no appreciable evidence of ischemia. no significant change from previous study  . Hx of echocardiogram 08-15-2012   EF  45-50 % very difficult study, severe LVH, pacemaker, trace TR,mildly enlarged RV,; aorta mildly calcified  . Pacemaker 2011   St. Jude Accent DDDR ; this is the third generator this pt. has had  . Shingles   . UTI (lower urinary tract infection)     Past Surgical History:  Procedure Laterality Date  . CARDIAC CATHETERIZATION  12-29-2003   rt. coronary angiography was done with a 6 french 4 cm taper coronary cath. This demonstrated  approximately 70 to 75% mildly segmental stenosis in  the mid RCA before the acute margin          . CARDIAC CATHETERIZATION  11-19-2003   diagnostic cath,, pt. to get second cath to have stent placed  . cardiac stents    . CATARACT EXTRACTION    . KNEE SURGERY Right   . PROSTATE BIOPSY    . prostate seed implants    . SPINE SURGERY    . WISDOM TOOTH EXTRACTION      Current  Medications: Outpatient Medications Prior to Visit  Medication Sig Dispense Refill  . aspirin 81 MG tablet Take 81 mg by mouth daily.    . cholecalciferol (VITAMIN D) 1000 UNITS tablet Take 2,000 Units by mouth daily.    . clopidogrel (PLAVIX) 75 MG tablet take 1 tablet by mouth once daily 30 tablet 10  . Coenzyme Q10 (COQ-10) 150 MG CAPS Take 1 capsule by mouth daily.     . furosemide (LASIX) 20 MG tablet take 1 tablet by mouth once daily ALTERNATING WITH 2 tablets as directed 60 tablet 9  . insulin NPH-regular Human (NOVOLIN 70/30) (70-30) 100 UNIT/ML injection Inject 50 Units into the skin 2 (two) times daily with a meal.     . levothyroxine (SYNTHROID, LEVOTHROID) 25 MCG tablet     . metoprolol succinate (TOPROL-XL) 25 MG 24 hr tablet TAKE 1 AND 1/2 TABLETS BY MOUTH DAILY 45 tablet 8  . NITROSTAT 0.4 MG SL tablet PLACE 1 TABLET UNDER THE TONGUE EVERY 5 MINUTES AS NEEDED FOR CHEST PAIN 25 tablet 10  . Omega-3 Fatty Acids (FISH OIL) 1000 MG CAPS Take 2,000 mg by mouth daily.    . pantoprazole (PROTONIX) 40 MG tablet take 1 tablet by mouth once daily 30 tablet 9  . potassium chloride (K-DUR) 10 MEQ tablet take 1 to 2 tablets by mouth once daily 60 tablet 9  . rosuvastatin (CRESTOR) 40 MG tablet take 1 tablet by mouth once daily 30 tablet 11  . vitamin C (ASCORBIC ACID) 500 MG tablet Take 500 mg by mouth daily.    . vitamin E 400 UNIT capsule Take 400 Units by mouth daily.    Marland Kitchen losartan (COZAAR) 100 MG tablet take 1 tablet by mouth once daily (Patient not taking: Reported on 02/18/2016) 30 tablet 9   No facility-administered medications prior to visit.      Allergies:   Review of patient's allergies indicates no known allergies.   Social History   Social History  . Marital status: Married    Spouse name: N/A  . Number of children: N/A  . Years of education: N/A   Social History Main Topics  . Smoking status: Never Smoker  . Smokeless tobacco: None  . Alcohol use Yes     Comment:  daily wine  . Drug use: No  . Sexual activity: Not Asked   Other Topics Concern  . None   Social History Narrative  . None     ROS:   Please see the history of present illness.    ROS All other systems reviewed and are negative.   PHYSICAL EXAM:   VS:  BP 129/79 (BP Location: Left Arm, Patient Position: Sitting, Cuff Size: Normal)   Pulse 91   Ht 5\' 7"  (1.702 m)   Wt 117.9 kg (260 lb)   SpO2 97%   BMI 40.72 kg/m    GEN: morbid obesity limits exam, well developed, in no acute distress  HEENT: normal  Neck: no JVD, carotid bruits, or masses  Cardiac: Healthy subclavian pacemaker site, paradoxically split second heart sound RRR; no murmurs, rubs, or gallops,no edema  Respiratory:  clear to auscultation bilaterally, normal work of breathing GI: soft, nontender, nondistended, + BS MS: no deformity or atrophy  Skin: warm and dry, no rash Neuro:  Alert and Oriented x 3, Strength and sensation are intact Psych: euthymic mood, full affect  Wt Readings from Last 3 Encounters:  02/18/16 117.9 kg (260 lb)  02/04/16 122.5 kg (270 lb)  02/18/15 122.5 kg (270 lb)      Studies/Labs Reviewed:   EKG:  EKG is not ordered today.  The ekg ordered 12/02/2015 demonstrates atrial paced, ventricular sensed rhythm with left bundle branch block  Recent Labs: 04/01/2015: ALT 18; BUN 38; Creat 2.01; Potassium 4.7; Sodium 137 04/03/2015: Hemoglobin 16.1; Platelets 212   Lipid Panel    Component Value Date/Time   CHOL 322 (H) 04/01/2015 0927   TRIG 501 (H) 04/01/2015 0927   HDL 26 (L) 04/01/2015 0927   CHOLHDL 12.4 (H) 04/01/2015 0927   VLDL NOT CALC 04/01/2015 0927   LDLCALC NOT CALC 04/01/2015 0927   LDLDIRECT 138 (H) 04/01/2015 0927     ASSESSMENT:    1. Sinus node dysfunction   2. Second degree AV block   3. Pacemaker   4. Chronic combined systolic and diastolic heart failure ([place])   5. Coronary artery disease involving native coronary artery of native heart without angina  pectoris   6. Type 2 diabetes mellitus with stage 3 chronic kidney disease, with long-term current use of insulin ([place])   7. Morbid obesity due to excess calories ([place])   8. Mixed hyperlipidemia   9. CKD (chronic kidney disease) stage 3, GFR 30-59 ml/min   10. OSA (obstructive sleep apnea)      PLAN:  In order of problems listed above:  1. SSS: Heart rate histogram suggests satisfactory sensor settings 2. 2nd deg AVB: Has relatively infrequent ventricular pacing, but has a left bundle branch block and likely has dyssynchrony during both paced and natively conducted beats. 3. PPM: Continue remote download every 3 months and yearly office visit 4. CHF: He has documented systolic and diastolic left ventricular dysfunction but seems to be well compensated hemodynamically on a very low dose of loop diuretic. He takes an angiotensin receptor blocker and a beta blocker. Doses were reduced due to hypotension recently. 5. CAD: s/p DES stenting proximal LAD stenosis and DX2 POBA, June, 2005 and staged RCA DES, August, 2005. Free of angina.  6. DM: When he has tried to diet, he has developed hypoglycemia and started eating again. I told him that when this happens he may benefit from reduction in the dose and insulin and that hopefully this will gradually lead to weight loss. He reports inability to exercise due to a variety of orthopedic problems. 7. Morbid obesity: Unless he loses weight it is highly likely that his metabolic problems will worsen and that eventually he will develop vascular and other complications of diabetes. 8. HLP: Back on high-dose rosuvastatin. I believe last year's labs were drawn when he was on a "statin holiday". He has significant hypertriglyceridemia and very low HDL cholesterol, in keeping with hyperinsulinemia/insulin resistance. Unlikely to improve without good glycemic control and lower insulin doses. Target LDL less than 100, preferably < 70. Need to get his updated labs. He  is not at target on rosuvastatin, consider adding PCS K9 inhibitor. 9. CKD stage 3-4: Likely primarily due to diabetic nephropathy. 10. OSA: Intolerant  of CPAP    Medication Adjustments/Labs and Tests Ordered: Current medicines are reviewed at length with the patient today.  Concerns regarding medicines are outlined above.  Medication changes, Labs and Tests ordered today are listed in the Patient Instructions below. Patient Instructions  Dr Sallyanne Kuster recommends that you continue on your current medications as directed. Please refer to the Current Medication list given to you today.  Remote monitoring is used to monitor your Pacemaker of ICD from home. This monitoring reduces the number of office visits required to check your device to one time per year. It allows [place] to keep an eye on the functioning of your device to ensure it is working properly. You are scheduled for a device check from home on Friday, December 29th, 2017. You may send your transmission at any time that day. If you have a wireless device, the transmission will be sent automatically. After your physician reviews your transmission, you will receive a postcard with your next transmission date.  Dr Sallyanne Kuster recommends that you schedule a follow-up appointment in 12 months with a device check. You will receive a reminder letter in the mail two months in advance. If you don't receive a letter, please call our office to schedule the follow-up appointment.  If you need a refill on your cardiac medications before your next appointment, please call your pharmacy.    Signed, Sanda Klein, MD  02/18/2016 2:14 PM    [facility] [address], [place], [place]  [address] Phone: [phone]; Fax: [phone]

## 2016-02-23 ENCOUNTER — Other Ambulatory Visit: Payer: Self-pay | Admitting: Cardiovascular Disease

## 2016-02-23 LAB — CUP PACEART INCLINIC DEVICE CHECK
Battery Remaining Longevity: 79 mo
Battery Remaining Percentage: 73 %
Battery Voltage: 2.92 V
Date Time Interrogation Session: 20171004142841
Implantable Lead Implant Date: 19901207
Implantable Lead Implant Date: 20111110
Implantable Lead Location: 753862
Implantable Lead Location: 753862
Lead Channel Setting Pacing Amplitude: 1.5 V
Lead Channel Setting Pacing Amplitude: 1.75 V
Lead Channel Setting Pacing Pulse Width: 0.6 ms
Lead Channel Setting Sensing Sensitivity: 3 mV
Pulse Gen Model: 2210
Pulse Gen Serial Number: 7174903

## 2016-03-03 NOTE — Progress Notes (Signed)
[facility] [employer] PREP Weekly Session   Patient Details  Name: Zayveon Raschke MRN: [id number] Date of Birth: [birth year] Age: 82 y.o. PCP: Lujean Amel, MD  Vitals:   03/03/16 1053  Weight: 263 lb (119.3 kg)        [name] [employer] Weekly seesion - 03/03/16 1000      Weekly Session   Topic Discussed Hitting roadblocks  guest speaker-Al   Minutes exercised this week 0 minutes   Classes attended to date [place] 03/03/2016, 10:55 AM

## 2016-03-10 ENCOUNTER — Other Ambulatory Visit: Payer: Self-pay | Admitting: Cardiovascular Disease

## 2016-03-10 DIAGNOSIS — Z23 Encounter for immunization: Secondary | ICD-10-CM | POA: Diagnosis not present

## 2016-03-10 NOTE — Telephone Encounter (Signed)
Rx(s) sent to pharmacy electronically.  

## 2016-03-15 NOTE — Progress Notes (Signed)
[facility] [employer] PREP Weekly Session   Patient Details  Name: [name] MRN: [id number] Date of Birth: [birth year] Age: 82 y.o. PCP: Lujean Amel, MD  Vitals:   03/15/16 1337  Weight: 264 lb (119.7 kg)        [name] [employer] Weekly seesion - 03/15/16 1300      Weekly Session   Topic Discussed Other  fat and calorie detective   Classes attended to date 12       Vanita Ingles 03/15/2016, 1:38 PM

## 2016-03-17 DIAGNOSIS — Z8546 Personal history of malignant neoplasm of prostate: Secondary | ICD-10-CM | POA: Diagnosis not present

## 2016-03-17 NOTE — Progress Notes (Signed)
[name]   Patient Details  Name: [name] MRN: [id number] Date of Birth: [birth year] Age: 82 y.o. PCP: William Amel, MD  Vitals:   03/17/16 1305  BP: (!) 158/80  Pulse: 83  Resp: 18  SpO2: 96%  Weight: 263 lb (119.3 kg)         [name] - 03/17/16 1300      Measurement   Neck measurement 19 Inches   Waist Circumference 54.5 inches   Body fat 40.1 percent     Quality of Life Assessment [RPT]   Date of Last Quality of Life Assessment [RPT] 11/01/15     Mobility and Daily Activities   I find it easy to walk up or down two or more flights of stairs. 2   I have no trouble taking out the trash. 3   I do housework such as vacuuming and dusting on my own without difficulty. 3   I can easily lift a gallon of milk (8lbs). 4   I can easily walk a mile. 2   I have no trouble reaching into high cupboards or reaching down to pick up something from the floor. 3   I do not have trouble doing out-door work such as Armed forces logistics/support/administrative officer, raking leaves, or gardening. 3     Mobility and Daily Activities   I feel younger than my age. 4   I feel independent. 4   I feel energetic. 2   I live an active life.  3   I feel strong. 1   I feel healthy. 2   I feel active as other people my age. 3     How fit and strong are you.   Fit and Strong Total Score 39     Past Medical History:  Diagnosis Date  . Abnormal Doppler ultrasound of carotid artery 12/16/2012   mildly abnormal doppler . no prior studies to compare to. follow up studies are recommended when clinically indicated.  Marland Kitchen Blepharitis   . Cancer ([place])   . Diabetes mellitus without complication ([place])   . Exogenous obesity   . Gastric ulcer   . GI bleed   . History of stress test 06/06/2012   low risk scan. fixed inferior defect consistant with prior infarction with moderatly reduced EF. no appreciable evidence of ischemia. no significant change from previous study  . Hx of echocardiogram 08-15-2012    EF  45-50 % very difficult study, severe LVH, pacemaker, trace TR,mildly enlarged RV,; aorta mildly calcified  . Pacemaker 2011   St. Jude Accent DDDR ; this is the third generator this pt. has had  . Shingles   . UTI (lower urinary tract infection)    Past Surgical History:  Procedure Laterality Date  . CARDIAC CATHETERIZATION  12-29-2003   rt. coronary angiography was done with a 6 french 4 cm taper coronary cath. This demonstrated  approximately 70 to 75% mildly segmental stenosis in the mid RCA before the acute margin          . CARDIAC CATHETERIZATION  11-19-2003   diagnostic cath,, pt. to get second cath to have stent placed  . cardiac stents    . CATARACT EXTRACTION    . KNEE SURGERY Right   . PROSTATE BIOPSY    . prostate seed implants    . SPINE SURGERY    . WISDOM TOOTH EXTRACTION     History  Smoking Status  . Never Smoker  Smokeless Tobacco  .  Not on file     12 Wellness sessions attended.  Comments:  [name] continues to exercise "every other day, about 120 minutes/week"  He finds that his CBG is about 100 points lower the evenings that he exercises, whereas when he doesn't, he says his CBG is in the 200's.  He also feels that he has better balance than he did when he began.  He states he will continue to exercise and has found out that his insurance will cover his membership after a $50 "copay" for a year.  He is aware of his numerous resources here at [name] to help him stay active or help him if he needs assistance.  [name] has been great to work with.     [name] 03/17/2016, 1:10 PM

## 2016-03-22 NOTE — Progress Notes (Signed)
[facility] [employer] PREP Weekly Session   Patient Details  Name: [name] MRN: [id number] Date of Birth: 1932-04-15 Age: 82 y.o. PCP: Lujean Amel, MD  Vitals:   03/22/16 1036  Weight: 264 lb (119.7 kg)        [name] [employer] Weekly seesion - 03/22/16 1000      Weekly Session   Topic Discussed Healthy eating tips   Minutes exercised this week 90 minutes   Classes attended to date 46   Comments [name] states he is continues to exercise every other day and is committed to that schedule.  He helped encourage a new class participant with suggestions on how to stay active despite being new to exercise.        Vanita Ingles 03/22/2016, 10:40 AM

## 2016-03-24 DIAGNOSIS — N3020 Other chronic cystitis without hematuria: Secondary | ICD-10-CM | POA: Diagnosis not present

## 2016-03-24 DIAGNOSIS — Z8546 Personal history of malignant neoplasm of prostate: Secondary | ICD-10-CM | POA: Diagnosis not present

## 2016-03-27 ENCOUNTER — Other Ambulatory Visit: Payer: Self-pay | Admitting: Cardiovascular Disease

## 2016-03-27 NOTE — Telephone Encounter (Signed)
Rx request sent to pharmacy.  

## 2016-04-11 ENCOUNTER — Encounter: Payer: Self-pay | Admitting: Podiatry

## 2016-04-11 ENCOUNTER — Ambulatory Visit (INDEPENDENT_AMBULATORY_CARE_PROVIDER_SITE_OTHER): Payer: Medicare Other | Admitting: Podiatry

## 2016-04-11 VITALS — Ht 67.0 in | Wt 260.0 lb

## 2016-04-11 DIAGNOSIS — E1140 Type 2 diabetes mellitus with diabetic neuropathy, unspecified: Secondary | ICD-10-CM | POA: Diagnosis not present

## 2016-04-11 DIAGNOSIS — B351 Tinea unguium: Secondary | ICD-10-CM | POA: Diagnosis not present

## 2016-04-11 DIAGNOSIS — M79609 Pain in unspecified limb: Secondary | ICD-10-CM

## 2016-04-11 DIAGNOSIS — Z794 Long term (current) use of insulin: Secondary | ICD-10-CM

## 2016-04-11 NOTE — Progress Notes (Signed)
PatComplaint:  Visit Type: Patient returns to my office for continued preventative foot care services. Complaint: Patient states" my nails have grown long and thick and become painful to walk and wear shoes" Patient has been diagnosed with DM with no complications. He presents for preventative foot care services. No changes to ROS  Podiatric Exam: Vascular: dorsalis pedis and posterior tibial pulses are not  palpable bilateral. Capillary return is immediate. Temperature gradient is WNL. Skin turgor WNL  Sensorium: Diminished l Semmes Weinstein monofilament test. Normal tactile sensation bilaterally. Nail Exam: Pt has thick disfigured discolored nails with subungual debris noted bilateral entire nail hallux through fifth toenails Ulcer Exam: There is no evidence of ulcer or pre-ulcerative changes or infection. Orthopedic Exam: Muscle tone and strength are WNL. No limitations in general ROM. No crepitus or effusions noted. Foot type and digits show no abnormalities. Bony prominences are unremarkable. Skin: No Porokeratosis. No infection or ulcers  Diagnosis:  Tinea unguium, Pain in right toe, pain in left toes  Treatment & Plan Procedures and Treatment: Consent by patient was obtained for treatment procedures. The patient understood the discussion of treatment and procedures well. All questions were answered thoroughly reviewed. Debridement of mycotic and hypertrophic toenails, 1 through 5 bilateral and clearing of subungual debris. No ulceration, no infection noted.  Return Visit-Office Procedure: Patient instructed to return to the office for a follow up visit 3 months for continued evaluation and treatment.ient ID: [name], male   DOB: 05-Mar-1932, 82 y.o.   MRN: [id number]   [name] DPM

## 2016-04-20 DIAGNOSIS — Z0000 Encounter for general adult medical examination without abnormal findings: Secondary | ICD-10-CM | POA: Diagnosis not present

## 2016-04-20 DIAGNOSIS — E1142 Type 2 diabetes mellitus with diabetic polyneuropathy: Secondary | ICD-10-CM | POA: Diagnosis not present

## 2016-04-21 DIAGNOSIS — Z794 Long term (current) use of insulin: Secondary | ICD-10-CM | POA: Diagnosis not present

## 2016-04-21 DIAGNOSIS — E1142 Type 2 diabetes mellitus with diabetic polyneuropathy: Secondary | ICD-10-CM | POA: Diagnosis not present

## 2016-05-26 ENCOUNTER — Telehealth: Payer: Self-pay | Admitting: Cardiology

## 2016-05-26 ENCOUNTER — Ambulatory Visit (INDEPENDENT_AMBULATORY_CARE_PROVIDER_SITE_OTHER): Payer: Medicare Other | Admitting: *Deleted

## 2016-05-26 DIAGNOSIS — I495 Sick sinus syndrome: Secondary | ICD-10-CM | POA: Diagnosis not present

## 2016-05-26 NOTE — Progress Notes (Signed)
Remote pacemaker transmission.   

## 2016-05-26 NOTE — Telephone Encounter (Signed)
LMOVM reminding pt to send remote transmission.   

## 2016-05-31 ENCOUNTER — Encounter: Payer: Self-pay | Admitting: Cardiology

## 2016-06-14 LAB — CUP PACEART REMOTE DEVICE CHECK
Battery Remaining Longevity: 65 mo
Battery Remaining Percentage: 57 %
Battery Voltage: 2.89 V
Brady Statistic AP VP Percent: 16 %
Brady Statistic AP VS Percent: 34 %
Brady Statistic AS VP Percent: 14 %
Brady Statistic AS VS Percent: 36 %
Brady Statistic RA Percent Paced: 49 %
Brady Statistic RV Percent Paced: 30 %
Date Time Interrogation Session: 20180105192259
Implantable Lead Implant Date: 19901207
Implantable Lead Implant Date: 20111110
Implantable Lead Location: 753862
Implantable Lead Location: 753862
Implantable Pulse Generator Implant Date: 20111110
Lead Channel Impedance Value: 280 Ohm
Lead Channel Impedance Value: 540 Ohm
Lead Channel Pacing Threshold Amplitude: 0.875 V
Lead Channel Pacing Threshold Amplitude: 1.125 V
Lead Channel Pacing Threshold Pulse Width: 0.4 ms
Lead Channel Pacing Threshold Pulse Width: 0.6 ms
Lead Channel Sensing Intrinsic Amplitude: 4.7 mV
Lead Channel Sensing Intrinsic Amplitude: 9.6 mV
Lead Channel Setting Pacing Amplitude: 1.375
Lead Channel Setting Pacing Amplitude: 1.875
Lead Channel Setting Pacing Pulse Width: 0.6 ms
Lead Channel Setting Sensing Sensitivity: 3 mV
Pulse Gen Model: 2210
Pulse Gen Serial Number: 7174903

## 2016-06-15 DIAGNOSIS — Z6841 Body Mass Index (BMI) 40.0 and over, adult: Secondary | ICD-10-CM | POA: Diagnosis not present

## 2016-06-15 DIAGNOSIS — N183 Chronic kidney disease, stage 3 (moderate): Secondary | ICD-10-CM | POA: Diagnosis not present

## 2016-06-15 DIAGNOSIS — Z794 Long term (current) use of insulin: Secondary | ICD-10-CM | POA: Diagnosis not present

## 2016-06-15 DIAGNOSIS — E1142 Type 2 diabetes mellitus with diabetic polyneuropathy: Secondary | ICD-10-CM | POA: Diagnosis not present

## 2016-06-15 DIAGNOSIS — E039 Hypothyroidism, unspecified: Secondary | ICD-10-CM | POA: Diagnosis not present

## 2016-06-17 ENCOUNTER — Other Ambulatory Visit: Payer: Self-pay | Admitting: Cardiovascular Disease

## 2016-06-20 ENCOUNTER — Encounter: Payer: Self-pay | Admitting: Podiatry

## 2016-06-20 ENCOUNTER — Ambulatory Visit (INDEPENDENT_AMBULATORY_CARE_PROVIDER_SITE_OTHER): Payer: Medicare Other | Admitting: Podiatry

## 2016-06-20 DIAGNOSIS — E1140 Type 2 diabetes mellitus with diabetic neuropathy, unspecified: Secondary | ICD-10-CM | POA: Diagnosis not present

## 2016-06-20 DIAGNOSIS — B351 Tinea unguium: Secondary | ICD-10-CM | POA: Diagnosis not present

## 2016-06-20 DIAGNOSIS — Z794 Long term (current) use of insulin: Secondary | ICD-10-CM

## 2016-06-20 NOTE — Progress Notes (Signed)
Complaint:  Visit Type: Patient returns to my office for continued preventative foot care services. Complaint: Patient states" my nails have grown long and thick and become painful to walk and wear shoes" Patient has been diagnosed with DM with no foot complications. The patient presents for preventative foot care services. No changes to ROS  Podiatric Exam: Vascular: dorsalis pedis and posterior tibial pulses are palpable bilateral. Capillary return is immediate. Temperature gradient is WNL. Skin turgor WNL  Sensorium: Normal Semmes Weinstein monofilament test. Normal tactile sensation bilaterally. Nail Exam: Pt has thick disfigured discolored nails with subungual debris noted bilateral entire nail hallux through fifth toenails Ulcer Exam: There is no evidence of ulcer or pre-ulcerative changes or infection. Orthopedic Exam: Muscle tone and strength are WNL. No limitations in general ROM. No crepitus or effusions noted. Foot type and digits show no abnormalities. Bony prominences are unremarkable. Skin: No Porokeratosis. No infection or ulcers  Diagnosis:  Onychomycosis, , Pain in right toe, pain in left toes  Treatment & Plan Procedures and Treatment: Consent by patient was obtained for treatment procedures. The patient understood the discussion of treatment and procedures well. All questions were answered thoroughly reviewed. Debridement of mycotic and hypertrophic toenails, 1 through 5 bilateral and clearing of subungual debris. No ulceration, no infection noted. Patient is still having achilles tendinitis pain therefore I recommended he be seen by the medical side of the practice. Gave parking sticker for 6 months.  Return Visit-Office Procedure: Patient instructed to return to the office for a follow up visit 3 months for continued evaluation and treatment.    [name] DPM

## 2016-06-24 ENCOUNTER — Other Ambulatory Visit: Payer: Self-pay | Admitting: Cardiovascular Disease

## 2016-06-26 ENCOUNTER — Telehealth: Payer: Self-pay | Admitting: *Deleted

## 2016-06-26 NOTE — Telephone Encounter (Signed)
Pt states he was supposed to be referred to another doctor for chronic tendonitis. I informed pt Dr. Prudence Davidson was referring him to a Dr. Amalia Hailey in our office. Pt states understanding and will call later to schedule.

## 2016-06-26 NOTE — Telephone Encounter (Signed)
Rx(s) sent to pharmacy electronically.  

## 2016-06-27 DIAGNOSIS — Z6841 Body Mass Index (BMI) 40.0 and over, adult: Secondary | ICD-10-CM | POA: Diagnosis not present

## 2016-06-27 DIAGNOSIS — I10 Essential (primary) hypertension: Secondary | ICD-10-CM | POA: Diagnosis not present

## 2016-06-27 DIAGNOSIS — E1121 Type 2 diabetes mellitus with diabetic nephropathy: Secondary | ICD-10-CM | POA: Diagnosis not present

## 2016-06-27 DIAGNOSIS — N183 Chronic kidney disease, stage 3 (moderate): Secondary | ICD-10-CM | POA: Diagnosis not present

## 2016-07-01 ENCOUNTER — Other Ambulatory Visit: Payer: Self-pay | Admitting: Cardiovascular Disease

## 2016-07-17 ENCOUNTER — Ambulatory Visit (INDEPENDENT_AMBULATORY_CARE_PROVIDER_SITE_OTHER): Payer: Medicare Other | Admitting: Podiatry

## 2016-07-17 ENCOUNTER — Encounter: Payer: Self-pay | Admitting: Podiatry

## 2016-07-17 DIAGNOSIS — M722 Plantar fascial fibromatosis: Secondary | ICD-10-CM | POA: Diagnosis not present

## 2016-07-17 DIAGNOSIS — M79672 Pain in left foot: Secondary | ICD-10-CM

## 2016-07-23 MED ORDER — BETAMETHASONE SOD PHOS & ACET 6 (3-3) MG/ML IJ SUSP: 3.0000 mg | Freq: Once | INTRAMUSCULAR | Status: AC

## 2016-07-23 NOTE — Progress Notes (Signed)
    Subjective: Patient presents today for evaluation of left foot pain that initially started after day walking for approximately approximately 2 years ago. Patient states that he has had injections in the past and they have helped. Patient currently has a pacemaker. Patient states that at the moment he is not able to walk at all due to the pain.  Patient also has a history of right knee pain secondary to trauma which occurred several years ago. patient also has a history of back pain.   Objective: Physical Exam General: The patient is alert and oriented x3 in no acute distress.  Dermatology: Skin is warm, dry and supple bilateral lower extremities. Negative for open lesions or macerations bilateral.   Vascular: Dorsalis Pedis and Posterior Tibial pulses palpable bilateral.  Capillary fill time is immediate to all digits.  Neurological: Epicritic and protective threshold intact bilateral.   Musculoskeletal: Tenderness to palpation at the medial calcaneal tubercale and through the insertion of the plantar fascia of the left foot. All other joints range of motion within normal limits bilateral. Strength 5/5 in all groups bilateral.   Assessment: 1. Plantar fasciitis left foot 2. Pain in left foot  Plan of Care:   1. Patient evaluated. Xrays reviewed.   2. Injection of 0.5cc Celestone soluspan injected into the left plantar fascia.  3. Instructed patient regarding therapies and modalities at home to alleviate symptoms.  4. Plantar fascial band(s) dispensed. 5. Return to clinic in 4 weeks.     Edrick Kins, DPM [facility]  Dr. Edrick Kins, [address]                                        [place], [place] [address]                Office [phone]  Fax [phone]

## 2016-07-28 DIAGNOSIS — E119 Type 2 diabetes mellitus without complications: Secondary | ICD-10-CM | POA: Diagnosis not present

## 2016-07-28 DIAGNOSIS — H524 Presbyopia: Secondary | ICD-10-CM | POA: Diagnosis not present

## 2016-07-31 DIAGNOSIS — E1121 Type 2 diabetes mellitus with diabetic nephropathy: Secondary | ICD-10-CM | POA: Diagnosis not present

## 2016-07-31 DIAGNOSIS — I10 Essential (primary) hypertension: Secondary | ICD-10-CM | POA: Diagnosis not present

## 2016-07-31 DIAGNOSIS — N183 Chronic kidney disease, stage 3 (moderate): Secondary | ICD-10-CM | POA: Diagnosis not present

## 2016-07-31 DIAGNOSIS — N2581 Secondary hyperparathyroidism of renal origin: Secondary | ICD-10-CM | POA: Diagnosis not present

## 2016-07-31 DIAGNOSIS — Z6841 Body Mass Index (BMI) 40.0 and over, adult: Secondary | ICD-10-CM | POA: Diagnosis not present

## 2016-08-03 ENCOUNTER — Ambulatory Visit
Admission: RE | Admit: 2016-08-03 | Discharge: 2016-08-03 | Disposition: A | Discharge status: Home / Self Care | Payer: Medicare Other | Source: Ambulatory Visit | Attending: Family Medicine | Admitting: Family Medicine

## 2016-08-03 ENCOUNTER — Other Ambulatory Visit: Payer: Self-pay | Admitting: Family Medicine

## 2016-08-03 DIAGNOSIS — R1032 Left lower quadrant pain: Secondary | ICD-10-CM

## 2016-08-03 DIAGNOSIS — N183 Chronic kidney disease, stage 3 (moderate): Secondary | ICD-10-CM | POA: Diagnosis not present

## 2016-08-14 ENCOUNTER — Ambulatory Visit (INDEPENDENT_AMBULATORY_CARE_PROVIDER_SITE_OTHER): Payer: Medicare Other | Admitting: Podiatry

## 2016-08-14 ENCOUNTER — Encounter: Payer: Self-pay | Admitting: Podiatry

## 2016-08-14 DIAGNOSIS — M722 Plantar fascial fibromatosis: Secondary | ICD-10-CM

## 2016-08-14 DIAGNOSIS — M7661 Achilles tendinitis, right leg: Secondary | ICD-10-CM

## 2016-08-14 DIAGNOSIS — M79672 Pain in left foot: Secondary | ICD-10-CM | POA: Diagnosis not present

## 2016-08-14 DIAGNOSIS — M79671 Pain in right foot: Secondary | ICD-10-CM | POA: Diagnosis not present

## 2016-08-14 DIAGNOSIS — M79673 Pain in unspecified foot: Secondary | ICD-10-CM

## 2016-08-18 NOTE — Progress Notes (Signed)
    Subjective: Patient presents today for follow-up treatment and evaluation of plantar fasciitis to the left foot. Patient states that he is doing somewhat better however the pain is worse when walking long distances. Patient also thinks that the plantar fascial braces helping.  Objective: Physical Exam General: The patient is alert and oriented x3 in no acute distress.  Dermatology: Skin is warm, dry and supple bilateral lower extremities. Negative for open lesions or macerations bilateral.   Vascular: Dorsalis Pedis and Posterior Tibial pulses palpable bilateral.  Capillary fill time is immediate to all digits.  Neurological: Epicritic and protective threshold intact bilateral.   Musculoskeletal: Tenderness to palpation at the medial calcaneal tubercale and through the insertion of the plantar fascia of the left foot. All other joints range of motion within normal limits bilateral. Strength 5/5 in all groups bilateral. Tenderness to palpation also noted to the plantar fascia of the right foot as well as the insertion of the Achilles tendon right foot   Assessment: 1. Plantar fasciitis bilateral  2. Insertional Achilles tendinitis right  Plan of Care:   1. Patient evaluated.  2. Plantar fascial braces dispensed for the right foot. 3. Silicone heel sleeve also dispensed for insertional Achilles tendinitis 4. Recommend over-the-counter insoles out of a sports 5. Return to clinic in 4 weeks  Edrick Kins, DPM [facility]  Dr. Edrick Kins, [address]                                        [place], [place] [address]                Office [phone]  Fax [phone]

## 2016-08-28 ENCOUNTER — Ambulatory Visit (INDEPENDENT_AMBULATORY_CARE_PROVIDER_SITE_OTHER): Payer: Medicare Other | Admitting: *Deleted

## 2016-08-28 DIAGNOSIS — I495 Sick sinus syndrome: Secondary | ICD-10-CM | POA: Diagnosis not present

## 2016-08-28 NOTE — Progress Notes (Signed)
Remote pacemaker transmission.   

## 2016-08-30 ENCOUNTER — Encounter: Payer: Self-pay | Admitting: Cardiology

## 2016-09-01 LAB — CUP PACEART REMOTE DEVICE CHECK
Battery Remaining Longevity: 58 mo
Battery Remaining Percentage: 51 %
Battery Voltage: 2.87 V
Brady Statistic AP VP Percent: 16 %
Brady Statistic AP VS Percent: 33 %
Brady Statistic AS VP Percent: 15 %
Brady Statistic AS VS Percent: 35 %
Brady Statistic RA Percent Paced: 49 %
Brady Statistic RV Percent Paced: 31 %
Date Time Interrogation Session: 20180409070733
Implantable Lead Implant Date: 19901207
Implantable Lead Implant Date: 20111110
Implantable Lead Location: 753862
Implantable Lead Location: 753862
Implantable Pulse Generator Implant Date: 20111110
Lead Channel Impedance Value: 280 Ohm
Lead Channel Impedance Value: 540 Ohm
Lead Channel Pacing Threshold Amplitude: 0.875 V
Lead Channel Pacing Threshold Amplitude: 1.125 V
Lead Channel Pacing Threshold Pulse Width: 0.4 ms
Lead Channel Pacing Threshold Pulse Width: 0.6 ms
Lead Channel Sensing Intrinsic Amplitude: 4.8 mV
Lead Channel Sensing Intrinsic Amplitude: 8.8 mV
Lead Channel Setting Pacing Amplitude: 1.375
Lead Channel Setting Pacing Amplitude: 1.875
Lead Channel Setting Pacing Pulse Width: 0.6 ms
Lead Channel Setting Sensing Sensitivity: 3 mV
Pulse Gen Model: 2210
Pulse Gen Serial Number: 7174903

## 2016-09-11 ENCOUNTER — Ambulatory Visit (INDEPENDENT_AMBULATORY_CARE_PROVIDER_SITE_OTHER): Payer: Medicare Other | Admitting: Podiatry

## 2016-09-11 DIAGNOSIS — M79672 Pain in left foot: Secondary | ICD-10-CM | POA: Diagnosis not present

## 2016-09-11 DIAGNOSIS — M79671 Pain in right foot: Secondary | ICD-10-CM | POA: Diagnosis not present

## 2016-09-11 DIAGNOSIS — M79673 Pain in unspecified foot: Secondary | ICD-10-CM

## 2016-09-11 DIAGNOSIS — M722 Plantar fascial fibromatosis: Secondary | ICD-10-CM

## 2016-09-12 NOTE — Progress Notes (Signed)
    Subjective: Patient presents today for follow-up treatment and evaluation of plantar fasciitis to the left foot. Patient states that he is doing well and the pain is almost completely resolved with only some intermittent pain. She states the insoles help alleviate the pain.  Objective: Physical Exam General: The patient is alert and oriented x3 in no acute distress.  Dermatology: Skin is warm, dry and supple bilateral lower extremities. Negative for open lesions or macerations bilateral.   Vascular: Dorsalis Pedis and Posterior Tibial pulses palpable bilateral.  Capillary fill time is immediate to all digits.  Neurological: Epicritic and protective threshold intact bilateral.   Musculoskeletal Exam: Range of motion within normal limits to all pedal and ankle joints bilateral. Muscle strength 5/5 in all groups bilateral.   Assessment: 1. Plantar fasciitis bilateral- resolved 2. Insertional Achilles tendinitis right- resolved  Plan of Care:   1. Patient evaluated.  2. Continue wearing Superfeet insoles from [employer]. 3. Return to clinic when necessary  Edrick Kins, DPM [facility]  Dr. Edrick Kins, [address]                                        [place], [place] [address]                Office [phone]  Fax [phone]

## 2016-09-19 ENCOUNTER — Ambulatory Visit (INDEPENDENT_AMBULATORY_CARE_PROVIDER_SITE_OTHER): Payer: Medicare Other | Admitting: Podiatry

## 2016-09-19 ENCOUNTER — Encounter: Payer: Self-pay | Admitting: Podiatry

## 2016-09-19 DIAGNOSIS — Z794 Long term (current) use of insulin: Secondary | ICD-10-CM

## 2016-09-19 DIAGNOSIS — B351 Tinea unguium: Secondary | ICD-10-CM | POA: Diagnosis not present

## 2016-09-19 DIAGNOSIS — E1140 Type 2 diabetes mellitus with diabetic neuropathy, unspecified: Secondary | ICD-10-CM

## 2016-09-19 NOTE — Progress Notes (Signed)
Complaint:  Visit Type: Patient returns to my office for continued preventative foot care services. Complaint: Patient states" my nails have grown long and thick and become painful to walk and wear shoes" Patient has been diagnosed with DM with no foot complications. The patient presents for preventative foot care services. No changes to ROS  Podiatric Exam: Vascular: dorsalis pedis and posterior tibial pulses are palpable bilateral. Capillary return is immediate. Temperature gradient is WNL. Skin turgor WNL  Sensorium: Normal Semmes Weinstein monofilament test. Normal tactile sensation bilaterally. Nail Exam: Pt has thick disfigured discolored nails with subungual debris noted bilateral entire nail hallux through fifth toenails Ulcer Exam: There is no evidence of ulcer or pre-ulcerative changes or infection. Orthopedic Exam: Muscle tone and strength are WNL. No limitations in general ROM. No crepitus or effusions noted. Foot type and digits show no abnormalities. Bony prominences are unremarkable. Skin: No Porokeratosis. No infection or ulcers  Diagnosis:  Onychomycosis, , Pain in right toe, pain in left toes  Treatment & Plan Procedures and Treatment: Consent by patient was obtained for treatment procedures. The patient understood the discussion of treatment and procedures well. All questions were answered thoroughly reviewed. Debridement of mycotic and hypertrophic toenails, 1 through 5 bilateral and clearing of subungual debris. No ulceration, no infection noted.  Return Visit-Office Procedure: Patient instructed to return to the office for a follow up visit 3 months for continued evaluation and treatment.    [name] DPM 

## 2016-11-02 DIAGNOSIS — E1121 Type 2 diabetes mellitus with diabetic nephropathy: Secondary | ICD-10-CM | POA: Diagnosis not present

## 2016-11-02 DIAGNOSIS — I10 Essential (primary) hypertension: Secondary | ICD-10-CM | POA: Diagnosis not present

## 2016-11-02 DIAGNOSIS — N2581 Secondary hyperparathyroidism of renal origin: Secondary | ICD-10-CM | POA: Diagnosis not present

## 2016-11-02 DIAGNOSIS — N183 Chronic kidney disease, stage 3 (moderate): Secondary | ICD-10-CM | POA: Diagnosis not present

## 2016-11-02 DIAGNOSIS — Z6841 Body Mass Index (BMI) 40.0 and over, adult: Secondary | ICD-10-CM | POA: Diagnosis not present

## 2016-11-09 ENCOUNTER — Emergency Department (HOSPITAL_COMMUNITY)
Admission: EM | Admit: 2016-11-09 | Discharge: 2016-11-09 | Disposition: A | Discharge status: Home / Self Care | Payer: Medicare Other | Attending: Emergency Medicine | Admitting: Emergency Medicine

## 2016-11-09 ENCOUNTER — Encounter (HOSPITAL_COMMUNITY): Payer: Self-pay | Admitting: Emergency Medicine

## 2016-11-09 ENCOUNTER — Emergency Department (HOSPITAL_COMMUNITY): Payer: Medicare Other

## 2016-11-09 DIAGNOSIS — Z950 Presence of cardiac pacemaker: Secondary | ICD-10-CM | POA: Diagnosis not present

## 2016-11-09 DIAGNOSIS — Z794 Long term (current) use of insulin: Secondary | ICD-10-CM | POA: Insufficient documentation

## 2016-11-09 DIAGNOSIS — I130 Hypertensive heart and chronic kidney disease with heart failure and stage 1 through stage 4 chronic kidney disease, or unspecified chronic kidney disease: Secondary | ICD-10-CM | POA: Insufficient documentation

## 2016-11-09 DIAGNOSIS — N183 Chronic kidney disease, stage 3 (moderate): Secondary | ICD-10-CM | POA: Diagnosis not present

## 2016-11-09 DIAGNOSIS — I5042 Chronic combined systolic (congestive) and diastolic (congestive) heart failure: Secondary | ICD-10-CM | POA: Insufficient documentation

## 2016-11-09 DIAGNOSIS — Z7982 Long term (current) use of aspirin: Secondary | ICD-10-CM | POA: Insufficient documentation

## 2016-11-09 DIAGNOSIS — K5900 Constipation, unspecified: Secondary | ICD-10-CM | POA: Insufficient documentation

## 2016-11-09 DIAGNOSIS — R140 Abdominal distension (gaseous): Secondary | ICD-10-CM | POA: Diagnosis not present

## 2016-11-09 DIAGNOSIS — M545 Low back pain, unspecified: Secondary | ICD-10-CM

## 2016-11-09 DIAGNOSIS — G8929 Other chronic pain: Secondary | ICD-10-CM | POA: Diagnosis not present

## 2016-11-09 DIAGNOSIS — Z7902 Long term (current) use of antithrombotics/antiplatelets: Secondary | ICD-10-CM | POA: Diagnosis not present

## 2016-11-09 DIAGNOSIS — E1122 Type 2 diabetes mellitus with diabetic chronic kidney disease: Secondary | ICD-10-CM | POA: Diagnosis not present

## 2016-11-09 DIAGNOSIS — Z79899 Other long term (current) drug therapy: Secondary | ICD-10-CM | POA: Insufficient documentation

## 2016-11-09 DIAGNOSIS — Z87891 Personal history of nicotine dependence: Secondary | ICD-10-CM | POA: Diagnosis not present

## 2016-11-09 LAB — COMPREHENSIVE METABOLIC PANEL
ALT: 25 U/L (ref 17–63)
AST: 30 U/L (ref 15–41)
Albumin: 3.9 g/dL (ref 3.5–5.0)
Alkaline Phosphatase: 52 U/L (ref 38–126)
Anion gap: 10 (ref 5–15)
BUN: 28 mg/dL — ABNORMAL HIGH (ref 6–20)
CO2: 26 mmol/L (ref 22–32)
Calcium: 9.3 mg/dL (ref 8.9–10.3)
Chloride: 101 mmol/L (ref 101–111)
Creatinine, Ser: 1.86 mg/dL — ABNORMAL HIGH (ref 0.61–1.24)
GFR calc Af Amer: 37 mL/min — ABNORMAL LOW (ref >60)
GFR calc non Af Amer: 32 mL/min — ABNORMAL LOW (ref >60)
Glucose, Bld: 162 mg/dL — ABNORMAL HIGH (ref 65–99)
Potassium: 4.6 mmol/L (ref 3.5–5.1)
Sodium: 137 mmol/L (ref 135–145)
Total Bilirubin: 0.9 mg/dL (ref 0.3–1.2)
Total Protein: 7 g/dL (ref 6.5–8.1)

## 2016-11-09 LAB — CBC
HCT: 49.3 % (ref 39.0–52.0)
Hemoglobin: 16.2 g/dL (ref 13.0–17.0)
MCH: 29.6 pg (ref 26.0–34.0)
MCHC: 32.9 g/dL (ref 30.0–36.0)
MCV: 90.1 fL (ref 78.0–100.0)
Platelets: 142 10*3/uL — ABNORMAL LOW (ref 150–400)
RBC: 5.47 MIL/uL (ref 4.22–5.81)
RDW: 14.3 % (ref 11.5–15.5)
WBC: 10.4 10*3/uL (ref 4.0–10.5)

## 2016-11-09 NOTE — ED Provider Notes (Signed)
[name] DEPT Provider Note   CSN: [id number] Arrival date & time: 11/09/16  0754     History   Chief Complaint Chief Complaint  Patient presents with  . Back Pain  . Constipation    HPI [name] is a 83 y.o. male.  Patient c/o constipation x past 3 days. Is passing gas. Hx intermittent constipation in past.  Denies abd pain or vomiting. Denies prior abd surgery.  No fever or chills. No dysuria or gu c/o. No recent change in meds, no narcotic use.  Patient also c/o low back pain x 1 year. Pain moderate, dull, worse w w certain movements, bending at waist, lifting. Non radiating. No acute or abrupt change. No leg numbness/weakness.    The history is provided by the patient.  Back Pain   Pertinent negatives include no chest pain, no fever, no numbness, no abdominal pain, no dysuria and no weakness.  Constipation   Pertinent negatives include no abdominal pain and no dysuria.    Past Medical History:  Diagnosis Date  . Abnormal Doppler ultrasound of carotid artery 12/16/2012   mildly abnormal doppler . no prior studies to compare to. follow up studies are recommended when clinically indicated.  Marland Kitchen Blepharitis   . Cancer ([place])   . Diabetes mellitus without complication ([place])   . Exogenous obesity   . Gastric ulcer   . GI bleed   . History of stress test 06/06/2012   low risk scan. fixed inferior defect consistant with prior infarction with moderatly reduced EF. no appreciable evidence of ischemia. no significant change from previous study  . Hx of echocardiogram 08-15-2012   EF  45-50 % very difficult study, severe LVH, pacemaker, trace TR,mildly enlarged RV,; aorta mildly calcified  . Pacemaker 2011   St. Jude Accent DDDR ; this is the third generator this pt. has had  . Shingles   . UTI (lower urinary tract infection)     Patient Active Problem List   Diagnosis Date Noted  . Morbid obesity ([place]) 02/18/2016  . Chronic combined systolic and diastolic heart  failure ([place]) 02/19/2015  . Sinus node dysfunction 03/09/2014  . Second degree AV block 03/09/2014  . Pacemaker - dual chamber St. Jude RF 07/24/2013  . Cardiomyopathy, ischemic 07/24/2013  . CAD (coronary artery disease) 07/24/2013  . OSA (obstructive sleep apnea) 07/24/2013  . CKD (chronic kidney disease) stage 3, GFR 30-59 ml/min 07/24/2013  . DM type 2 causing CKD stage 3 ([place]) 07/24/2013  . Mixed hyperlipidemia 07/24/2013    Past Surgical History:  Procedure Laterality Date  . CARDIAC CATHETERIZATION  12-29-2003   rt. coronary angiography was done with a 6 french 4 cm taper coronary cath. This demonstrated  approximately 70 to 75% mildly segmental stenosis in the mid RCA before the acute margin          . CARDIAC CATHETERIZATION  11-19-2003   diagnostic cath,, pt. to get second cath to have stent placed  . cardiac stents    . CATARACT EXTRACTION    . KNEE SURGERY Right   . PROSTATE BIOPSY    . prostate seed implants    . SPINE SURGERY    . WISDOM TOOTH EXTRACTION         Home Medications    Prior to Admission medications   Medication Sig Start Date End Date Taking? Authorizing Provider  aspirin 81 MG tablet Take 81 mg by mouth daily.    [provider]  cholecalciferol (VITAMIN D) 1000  UNITS tablet Take 2,000 Units by mouth daily.    [provider]  clopidogrel (PLAVIX) 75 MG tablet take 1 tablet by mouth once daily 03/27/16   Croitoru, Mihai, MD  Coenzyme Q10 (COQ-10) 150 MG CAPS Take 1 capsule by mouth daily.     [provider]  furosemide (LASIX) 20 MG tablet take 1 tablet by mouth once daily ALTERNATING WITH 2 TABLETS AS DIRECTED 03/27/16   Croitoru, Mihai, MD  insulin NPH-regular Human (NOVOLIN 70/30) (70-30) 100 UNIT/ML injection Inject 50 Units into the skin 2 (two) times daily with a meal.     [provider]  levothyroxine (SYNTHROID, LEVOTHROID) 25 MCG tablet  02/09/14   [provider]  losartan (COZAAR) 100 MG tablet  Take 50 mg by mouth daily.    [provider]  losartan (COZAAR) 100 MG tablet take 1 tablet by mouth once daily 07/03/16   Croitoru, Mihai, MD  metoprolol succinate (TOPROL-XL) 25 MG 24 hr tablet TAKE 1AND1/2 TABLET BY MOUTH DAILY 03/10/16   Croitoru, Mihai, MD  nitroGLYCERIN (NITROSTAT) 0.4 MG SL tablet PLACE 1 TABLET UNDER THE TONGUE EVERY 5 MINUTES AS NEEDED FOR CHEST PAIN 06/19/16   Croitoru, Mihai, MD  Omega-3 Fatty Acids (FISH OIL) 1000 MG CAPS Take 2,000 mg by mouth daily.    [provider]  pantoprazole (PROTONIX) 40 MG tablet Take 1 tablet (40 mg total) by mouth daily. 02/23/16   Croitoru, Mihai, MD  potassium chloride (K-DUR) 10 MEQ tablet TAKE 1-2 TABLETS BY MOUTH ONCE DAILY 06/26/16   Croitoru, Dani Gobble, MD  rosuvastatin (CRESTOR) 40 MG tablet take 1 tablet by mouth once daily 01/17/16   Croitoru, Mihai, MD  vitamin C (ASCORBIC ACID) 500 MG tablet Take 500 mg by mouth daily.    [provider]  vitamin E 400 UNIT capsule Take 400 Units by mouth daily.    [provider]    Family History History reviewed. No pertinent family history.  Social History Social History  Substance Use Topics  . Smoking status: Never Smoker  . Smokeless tobacco: Former Systems developer  . Alcohol use Yes     Comment: daily wine     Allergies   Patient has no known allergies.   Review of Systems Review of Systems  Constitutional: Negative for chills and fever.  HENT: Negative for sore throat.   Eyes: Negative for redness.  Respiratory: Negative for cough and shortness of breath.   Cardiovascular: Negative for chest pain.  Gastrointestinal: Positive for constipation. Negative for abdominal pain and vomiting.  Genitourinary: Negative for dysuria and flank pain.  Musculoskeletal: Positive for back pain.  Skin: Negative for rash.  Neurological: Negative for weakness and numbness.  Hematological: Does not bruise/bleed easily.  Psychiatric/Behavioral: Negative for confusion.       Physical Exam Updated Vital Signs BP (!) 177/129 (BP Location: Left Arm) Comment: 2nd bp: 171/128  Pulse 92   Temp 98.3 F (36.8 C) (Oral)   Resp 20   SpO2 96%   Physical Exam  Constitutional: He appears well-developed and well-nourished. No distress.  HENT:  Mouth/Throat: Oropharynx is clear and moist.  Eyes: Conjunctivae are normal.  Neck: Neck supple. No tracheal deviation present.  Cardiovascular: Normal rate, regular rhythm, normal heart sounds and intact distal pulses.   Pulmonary/Chest: Effort normal and breath sounds normal. No accessory muscle usage. No respiratory distress.  Abdominal: Soft. Bowel sounds are normal. He exhibits no distension and no mass. There is no tenderness. There is no  rebound and no guarding. No hernia.  Obese. No pulsatile mass.   Genitourinary:  Genitourinary Comments: Rectal, firm stool, no impaction. No mass felt.   Musculoskeletal: He exhibits no edema.  TLS spine, non tender, aligned, no step off.  bil lumbar muscular tenderness.    Neurological: He is alert.  Motor intact bil, sens grossly intact. Ambulates w steady gait.   Skin: Skin is warm and dry. He is not diaphoretic.  Psychiatric: He has a normal mood and affect.  Nursing note and vitals reviewed.    ED Treatments / Results  Labs (all labs ordered are listed, but only abnormal results are displayed) Results for orders placed or performed during the hospital encounter of 11/09/16  CBC  Result Value Ref Range   WBC 10.4 4.0 - 10.5 K/uL   RBC 5.47 4.22 - 5.81 MIL/uL   Hemoglobin 16.2 13.0 - 17.0 g/dL   HCT 49.3 39.0 - 52.0 %   MCV 90.1 78.0 - 100.0 fL   MCH 29.6 26.0 - 34.0 pg   MCHC 32.9 30.0 - 36.0 g/dL   RDW 14.3 11.5 - 15.5 %   Platelets 142 (L) 150 - 400 K/uL  Comprehensive metabolic panel  Result Value Ref Range   Sodium 137 135 - 145 mmol/L   Potassium 4.6 3.5 - 5.1 mmol/L   Chloride 101 101 - 111 mmol/L   CO2 26 22 - 32 mmol/L   Glucose, Bld 162 (H) 65 -  99 mg/dL   BUN 28 (H) 6 - 20 mg/dL   Creatinine, Ser 1.86 (H) 0.61 - 1.24 mg/dL   Calcium 9.3 8.9 - 10.3 mg/dL   Total Protein 7.0 6.5 - 8.1 g/dL   Albumin 3.9 3.5 - 5.0 g/dL   AST 30 15 - 41 U/L   ALT 25 17 - 63 U/L   Alkaline Phosphatase 52 38 - 126 U/L   Total Bilirubin 0.9 0.3 - 1.2 mg/dL   GFR calc non Af Amer 32 (L) >60 mL/min   GFR calc Af Amer 37 (L) >60 mL/min   Anion gap 10 5 - 15   Dg Abd 1 View  Result Date: 11/09/2016 CLINICAL DATA:  Abdominal pain and distention. Chronic constipation. EXAM: ABDOMEN - 1 VIEW COMPARISON:  CT 08/03/2016 . FINDINGS: Soft tissue structures are unremarkable. Implant seeds in the prostate digit. Nondistended air-filled loops of small and large bowel noted. Mild adynamic ileus cannot be excluded. Large amount of stool in the colon. Constipation cannot be excluded. Degenerative changes lumbar spine. Pacemaker lead noted over the right ventricle. IMPRESSION: Zero 1 large amount of stool in colon. Constipation cannot be excluded. 2. Nondistended air-filled loops of small and large bowel. Adynamic ileus cannot be excluded. Electronically Signed   By: Marcello [facility]ores  Register   On: 11/09/2016 11:26     EKG  EKG Interpretation None       Radiology Dg Abd 1 View  Result Date: 11/09/2016 CLINICAL DATA:  Abdominal pain and distention. Chronic constipation. EXAM: ABDOMEN - 1 VIEW COMPARISON:  CT 08/03/2016 . FINDINGS: Soft tissue structures are unremarkable. Implant seeds in the prostate digit. Nondistended air-filled loops of small and large bowel noted. Mild adynamic ileus cannot be excluded. Large amount of stool in the colon. Constipation cannot be excluded. Degenerative changes lumbar spine. Pacemaker lead noted over the right ventricle. IMPRESSION: Zero 1 large amount of stool in colon. Constipation cannot be excluded. 2. Nondistended air-filled loops of small and large bowel. Adynamic ileus cannot be excluded. Electronically  Signed   By: Marcello Moores  Register    On: 11/09/2016 11:26    Procedures Procedures (including critical care time)  Medications Ordered in ED Medications - No data to display   Initial Impression / Assessment and Plan / ED Course  I have reviewed the triage vital signs and the nursing notes.  Pertinent labs & imaging results that were available during my care of the patient were reviewed by me and considered in my medical decision making (see chart for details).  Reviewed nursing notes and prior charts for additional history.   Hx, kub c/w constipation.   abd is soft, non tender, no upper abd pain - not made of gallstones on prior imaging studies.   Spine non tender.   For constipation, rec colace and miralax, and pcp f/u.    Final Clinical Impressions(s) / ED Diagnoses   Final diagnoses:  None    New Prescriptions New Prescriptions   No medications on file     Lajean Saver, MD 11/09/16 1200

## 2016-11-09 NOTE — Discharge Instructions (Signed)
It was our pleasure to provide your ER care today - we hope that you feel better.  For constipation, drink plenty of fluids, get adequate fiber in diet.  Take colace (stool softener) 2x/day, and miralax (laxative) once daily as need - these 2 medications are available over the counter.   Follow up with primary care doctor in the coming week.  For back pain, take acetaminophen as need for pain.  On review of your prior records, you do have gallstones - gallstones can cause upper abdominal pain that can radiate towards midscapular area, typically worse after eating certain foods. Follow up with your doctor.   Return to ER if worse, new symptoms, fevers, worsening or severe abdominal pain, vomiting, other concern.

## 2016-11-09 NOTE — ED Notes (Signed)
Patient transported to X-ray 

## 2016-11-09 NOTE — ED Notes (Signed)
ED Provider at bedside. 

## 2016-11-09 NOTE — ED Triage Notes (Signed)
Pt states he had  severe back pain for a year- he feels it may be related to kidney pain or constipation. Pt took two laxatives last night without relief- and is unable to push due to the back pain. Pt states he is able to ambulate. Denies other symptoms.

## 2016-11-27 ENCOUNTER — Ambulatory Visit (INDEPENDENT_AMBULATORY_CARE_PROVIDER_SITE_OTHER): Payer: Medicare Other | Admitting: *Deleted

## 2016-11-27 DIAGNOSIS — I495 Sick sinus syndrome: Secondary | ICD-10-CM

## 2016-11-27 NOTE — Progress Notes (Signed)
Remote pacemaker transmission.   

## 2016-11-29 LAB — CUP PACEART REMOTE DEVICE CHECK
Battery Remaining Longevity: 65 mo
Battery Remaining Percentage: 57 %
Battery Voltage: 2.89 V
Brady Statistic AP VP Percent: 17 %
Brady Statistic AP VS Percent: 32 %
Brady Statistic AS VP Percent: 16 %
Brady Statistic AS VS Percent: 34 %
Brady Statistic RA Percent Paced: 48 %
Brady Statistic RV Percent Paced: 33 %
Date Time Interrogation Session: 20180709064417
Implantable Lead Implant Date: 19901207
Implantable Lead Implant Date: 20111110
Implantable Lead Location: 753862
Implantable Lead Location: 753862
Implantable Pulse Generator Implant Date: 20111110
Lead Channel Impedance Value: 280 Ohm
Lead Channel Impedance Value: 540 Ohm
Lead Channel Pacing Threshold Amplitude: 0.75 V
Lead Channel Pacing Threshold Amplitude: 1.125 V
Lead Channel Pacing Threshold Pulse Width: 0.4 ms
Lead Channel Pacing Threshold Pulse Width: 0.6 ms
Lead Channel Sensing Intrinsic Amplitude: 4.3 mV
Lead Channel Sensing Intrinsic Amplitude: 9.5 mV
Lead Channel Setting Pacing Amplitude: 1.375
Lead Channel Setting Pacing Amplitude: 1.75 V
Lead Channel Setting Pacing Pulse Width: 0.6 ms
Lead Channel Setting Sensing Sensitivity: 3 mV
Pulse Gen Model: 2210
Pulse Gen Serial Number: 7174903

## 2016-12-01 ENCOUNTER — Encounter: Payer: Self-pay | Admitting: Cardiology

## 2016-12-13 DIAGNOSIS — N183 Chronic kidney disease, stage 3 (moderate): Secondary | ICD-10-CM | POA: Diagnosis not present

## 2016-12-13 DIAGNOSIS — Z794 Long term (current) use of insulin: Secondary | ICD-10-CM | POA: Diagnosis not present

## 2016-12-13 DIAGNOSIS — E039 Hypothyroidism, unspecified: Secondary | ICD-10-CM | POA: Diagnosis not present

## 2016-12-13 DIAGNOSIS — E1142 Type 2 diabetes mellitus with diabetic polyneuropathy: Secondary | ICD-10-CM | POA: Diagnosis not present

## 2016-12-13 DIAGNOSIS — Z6841 Body Mass Index (BMI) 40.0 and over, adult: Secondary | ICD-10-CM | POA: Diagnosis not present

## 2016-12-20 ENCOUNTER — Ambulatory Visit (INDEPENDENT_AMBULATORY_CARE_PROVIDER_SITE_OTHER): Payer: Medicare Other | Admitting: Podiatry

## 2016-12-20 DIAGNOSIS — E1140 Type 2 diabetes mellitus with diabetic neuropathy, unspecified: Secondary | ICD-10-CM

## 2016-12-20 DIAGNOSIS — B351 Tinea unguium: Secondary | ICD-10-CM

## 2016-12-20 DIAGNOSIS — Z794 Long term (current) use of insulin: Secondary | ICD-10-CM

## 2016-12-20 NOTE — Progress Notes (Signed)
Complaint:  Visit Type: Patient returns to my office for continued preventative foot care services. Complaint: Patient states" my nails have grown long and thick and become painful to walk and wear shoes" Patient has been diagnosed with DM with no foot complications. The patient presents for preventative foot care services. No changes to ROS  Podiatric Exam: Vascular: dorsalis pedis and posterior tibial pulses are palpable bilateral. Capillary return is immediate. Temperature gradient is WNL. Skin turgor WNL  Sensorium: Normal Semmes Weinstein monofilament test. Normal tactile sensation bilaterally. Nail Exam: Pt has thick disfigured discolored nails with subungual debris noted bilateral entire nail hallux through fifth toenails Ulcer Exam: There is no evidence of ulcer or pre-ulcerative changes or infection. Orthopedic Exam: Muscle tone and strength are WNL. No limitations in general ROM. No crepitus or effusions noted. Foot type and digits show no abnormalities. Bony prominences are unremarkable. Skin: No Porokeratosis. No infection or ulcers  Diagnosis:  Onychomycosis, , Pain in right toe, pain in left toes  Treatment & Plan Procedures and Treatment: Consent by patient was obtained for treatment procedures. The patient understood the discussion of treatment and procedures well. All questions were answered thoroughly reviewed. Debridement of mycotic and hypertrophic toenails, 1 through 5 bilateral and clearing of subungual debris. No ulceration, no infection noted.  Return Visit-Office Procedure: Patient instructed to return to the office for a follow up visit 3 months for continued evaluation and treatment.    Laasia Arcos DPM 

## 2016-12-27 DIAGNOSIS — Z85828 Personal history of other malignant neoplasm of skin: Secondary | ICD-10-CM | POA: Diagnosis not present

## 2016-12-27 DIAGNOSIS — L820 Inflamed seborrheic keratosis: Secondary | ICD-10-CM | POA: Diagnosis not present

## 2016-12-27 DIAGNOSIS — C44311 Basal cell carcinoma of skin of nose: Secondary | ICD-10-CM | POA: Diagnosis not present

## 2017-01-01 ENCOUNTER — Other Ambulatory Visit: Payer: Self-pay | Admitting: Cardiovascular Disease

## 2017-01-26 ENCOUNTER — Other Ambulatory Visit: Payer: Self-pay | Admitting: Cardiovascular Disease

## 2017-02-07 DIAGNOSIS — N2581 Secondary hyperparathyroidism of renal origin: Secondary | ICD-10-CM | POA: Diagnosis not present

## 2017-02-07 DIAGNOSIS — I10 Essential (primary) hypertension: Secondary | ICD-10-CM | POA: Diagnosis not present

## 2017-02-07 DIAGNOSIS — Z6841 Body Mass Index (BMI) 40.0 and over, adult: Secondary | ICD-10-CM | POA: Diagnosis not present

## 2017-02-07 DIAGNOSIS — E1121 Type 2 diabetes mellitus with diabetic nephropathy: Secondary | ICD-10-CM | POA: Diagnosis not present

## 2017-02-07 DIAGNOSIS — N183 Chronic kidney disease, stage 3 (moderate): Secondary | ICD-10-CM | POA: Diagnosis not present

## 2017-02-09 ENCOUNTER — Other Ambulatory Visit: Payer: Self-pay | Admitting: Cardiovascular Disease

## 2017-02-13 ENCOUNTER — Other Ambulatory Visit: Payer: Self-pay

## 2017-02-13 MED ORDER — METOPROLOL SUCCINATE ER 25 MG PO TB24: 37.5000 mg | ORAL_TABLET | Freq: Every day | ORAL | 0 refills | Status: DC

## 2017-02-22 ENCOUNTER — Telehealth: Payer: Self-pay | Admitting: Cardiovascular Disease

## 2017-02-22 DIAGNOSIS — I499 Cardiac arrhythmia, unspecified: Secondary | ICD-10-CM | POA: Diagnosis not present

## 2017-02-22 DIAGNOSIS — Z23 Encounter for immunization: Secondary | ICD-10-CM | POA: Diagnosis not present

## 2017-02-22 DIAGNOSIS — R251 Tremor, unspecified: Secondary | ICD-10-CM | POA: Diagnosis not present

## 2017-02-22 NOTE — Telephone Encounter (Signed)
[name] went to see his PCP this morning d/t a high HR that is keeping him up at night occasionally. He reports that an EKG was done and the physician informed him his PPM was not pacing and he needed to contact Dr. Sallyanne Kuster right away. He reports his HR was around 90 at the time. He feels much better currently. His PPM lower rate is 65bpm. He says that urine and blood tests were completed during this visit and I advised him to follow up with his PCP about the results of the tests. He verbalizes understanding. Confirmed remote transmission due Monday 02/26/17.

## 2017-02-22 NOTE — Telephone Encounter (Signed)
New message    Pt is calling stating that his pace maker is not pacing.

## 2017-02-26 ENCOUNTER — Ambulatory Visit (INDEPENDENT_AMBULATORY_CARE_PROVIDER_SITE_OTHER): Payer: Medicare Other | Admitting: *Deleted

## 2017-02-26 DIAGNOSIS — I495 Sick sinus syndrome: Secondary | ICD-10-CM | POA: Diagnosis not present

## 2017-02-27 NOTE — Progress Notes (Signed)
Remote pacemaker transmission.   

## 2017-02-28 ENCOUNTER — Ambulatory Visit (INDEPENDENT_AMBULATORY_CARE_PROVIDER_SITE_OTHER): Payer: Medicare Other | Admitting: Cardiovascular Disease

## 2017-02-28 ENCOUNTER — Ambulatory Visit (INDEPENDENT_AMBULATORY_CARE_PROVIDER_SITE_OTHER): Payer: Medicare Other | Admitting: Podiatry

## 2017-02-28 ENCOUNTER — Encounter: Payer: Self-pay | Admitting: Podiatry

## 2017-02-28 ENCOUNTER — Encounter: Payer: Self-pay | Admitting: Cardiovascular Disease

## 2017-02-28 VITALS — BP 141/65 | HR 86 | Ht 67.0 in | Wt 263.0 lb

## 2017-02-28 DIAGNOSIS — N183 Chronic kidney disease, stage 3 unspecified: Secondary | ICD-10-CM

## 2017-02-28 DIAGNOSIS — Z794 Long term (current) use of insulin: Secondary | ICD-10-CM | POA: Diagnosis not present

## 2017-02-28 DIAGNOSIS — I5042 Chronic combined systolic (congestive) and diastolic (congestive) heart failure: Secondary | ICD-10-CM | POA: Diagnosis not present

## 2017-02-28 DIAGNOSIS — I2510 Atherosclerotic heart disease of native coronary artery without angina pectoris: Secondary | ICD-10-CM | POA: Diagnosis not present

## 2017-02-28 DIAGNOSIS — Z79899 Other long term (current) drug therapy: Secondary | ICD-10-CM | POA: Diagnosis not present

## 2017-02-28 DIAGNOSIS — I441 Atrioventricular block, second degree: Secondary | ICD-10-CM

## 2017-02-28 DIAGNOSIS — I495 Sick sinus syndrome: Secondary | ICD-10-CM

## 2017-02-28 DIAGNOSIS — Z950 Presence of cardiac pacemaker: Secondary | ICD-10-CM | POA: Diagnosis not present

## 2017-02-28 DIAGNOSIS — B351 Tinea unguium: Secondary | ICD-10-CM

## 2017-02-28 DIAGNOSIS — G4733 Obstructive sleep apnea (adult) (pediatric): Secondary | ICD-10-CM

## 2017-02-28 DIAGNOSIS — E1122 Type 2 diabetes mellitus with diabetic chronic kidney disease: Secondary | ICD-10-CM

## 2017-02-28 DIAGNOSIS — E782 Mixed hyperlipidemia: Secondary | ICD-10-CM | POA: Diagnosis not present

## 2017-02-28 DIAGNOSIS — M79676 Pain in unspecified toe(s): Secondary | ICD-10-CM | POA: Diagnosis not present

## 2017-02-28 DIAGNOSIS — E1140 Type 2 diabetes mellitus with diabetic neuropathy, unspecified: Secondary | ICD-10-CM

## 2017-02-28 MED ORDER — IRBESARTAN 150 MG PO TABS: 150.0000 mg | ORAL_TABLET | Freq: Every day | ORAL | 3 refills | Status: DC

## 2017-02-28 MED ORDER — POTASSIUM CHLORIDE ER 10 MEQ PO TBCR: 20.0000 meq | EXTENDED_RELEASE_TABLET | Freq: Every day | ORAL | 3 refills | Status: DC

## 2017-02-28 MED ORDER — FUROSEMIDE 40 MG PO TABS: 40.0000 mg | ORAL_TABLET | Freq: Every day | ORAL | 3 refills | Status: DC

## 2017-02-28 NOTE — Progress Notes (Signed)
Cardiology Office Note    Date:  03/02/2017   ID:  [name], DOB [birth year], MRN [id number]  PCP:  [name], MD  Cardiologist:   Sanda Klein, MD   Chief Complaint  Patient presents with  . Follow-up    History of Present Illness:  [name] is a 83 y.o. 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 The device was initially implanted in 1990, with generator changes in 2002 in 2011. Additional problems include minor nonobstructive coronary atherosclerosis, insulin requiring type 2 diabetes mellitus, hyperlipidemia, hypertension in the setting of morbid obesity.  Has generally done well since his last appointment. However, as his joint problems have improved and he tries to become more active he has noticed that he is limited by shortness of breath. He also becomes short of breath bending over. Describes functional class II maybe class III symptoms at times. Taking a shower will sometimes make him short of breath. He does not have orthopnea or PND and denies leg edema. He has not had angina pectoris and denies palpitations, dizziness, syncope, focal neurological complaints are intermittent claudication.  He believes losartan causes cramps and has demonstrated this by stopping it (with resolution of symptoms) and rechallenging himself (with recurrence of leg cramps).  Pacemaker interrogation shows normal device function. Estimated generator longevity is 4.1-4.6 years, lead parameters are all excellent. There is 48% atrial pacing and 33% ventricular pacing, virtually unchanged.. There have been no episodes of high ventricular rate or any atrial fibrillation. A handful of episodes of so-called most which are due to competitive sensor pacing.  He underwent staged LAD and RCA revascularization with drug-eluting stents in 2005 and has not required subsequent percutaneous revascularization procedures. His most  recent nuclear study in January 2014 showed an EF of 34% with a fixed inferior defect and no reversible ischemia. I'm not sure about the accuracy of left ventricular ejection fraction assessment. His most recent echocardiogram performed around the same date showed a mildly depressed LVEF of 45-50% (note the presence of right ventricular pacing which may alter the accuracy of EF assessment). 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  Past Medical History:  Diagnosis Date  . Abnormal Doppler ultrasound of carotid artery 12/16/2012   mildly abnormal doppler . no prior studies to compare to. follow up studies are recommended when clinically indicated.  Marland Kitchen Blepharitis   . Cancer ([place])   . Diabetes mellitus without complication ([place])   . Exogenous obesity   . Gastric ulcer   . GI bleed   . History of stress test 06/06/2012   low risk scan. fixed inferior defect consistant with prior infarction with moderatly reduced EF. no appreciable evidence of ischemia. no significant change from previous study  . Hx of echocardiogram 08-15-2012   EF  45-50 % very difficult study, severe LVH, pacemaker, trace TR,mildly enlarged RV,; aorta mildly calcified  . Pacemaker 2011   St. Jude Accent DDDR ; this is the third generator this pt. has had  . Shingles   . UTI (lower urinary tract infection)     Past Surgical History:  Procedure Laterality Date  . CARDIAC CATHETERIZATION  12-29-2003  rt. coronary angiography was done with a 6 french 4 cm taper coronary cath. This demonstrated  approximately 70 to 75% mildly segmental stenosis in the mid RCA before the acute margin          . CARDIAC CATHETERIZATION  11-19-2003    diagnostic cath,, pt. to get second cath to have stent placed  . cardiac stents    . CATARACT EXTRACTION    . KNEE SURGERY Right   . PROSTATE BIOPSY    . prostate seed implants    . SPINE SURGERY    . WISDOM TOOTH EXTRACTION      Current Medications: Outpatient Medications Prior to Visit  Medication Sig Dispense Refill  . aspirin 81 MG tablet Take 81 mg by mouth daily.    . bisacodyl (DULCOLAX) 5 MG EC tablet Take 5 mg by mouth daily as needed for moderate constipation.    . calcitRIOL (ROCALTROL) 0.25 MCG capsule Take 0.25 mcg by mouth every Monday, Wednesday, and Friday.    . cholecalciferol (VITAMIN D) 1000 UNITS tablet Take 2,000 Units by mouth daily.    . clopidogrel (PLAVIX) 75 MG tablet take 1 tablet by mouth once daily 30 tablet 10  . Coenzyme Q10 (COQ-10) 150 MG CAPS Take 1 capsule by mouth daily.     . insulin NPH-regular Human (NOVOLIN 70/30) (70-30) 100 UNIT/ML injection Inject 66 Units into the skin 2 (two) times daily with a meal.     . levothyroxine (SYNTHROID, LEVOTHROID) 25 MCG tablet Take 25 mcg by mouth daily before breakfast.     . metoprolol succinate (TOPROL-XL) 25 MG 24 hr tablet Take 1.5 tablets (37.5 mg total) by mouth daily. Please keep 02/28/17 appt for future refills 45 tablet 0  . nitroGLYCERIN (NITROSTAT) 0.4 MG SL tablet PLACE 1 TABLET UNDER THE TONGUE EVERY 5 MINUTES AS NEEDED FOR CHEST PAIN 25 tablet 10  . Omega-3 Fatty Acids (FISH OIL) 1000 MG CAPS Take 2,400 mg by mouth daily.     . pantoprazole (PROTONIX) 40 MG tablet Take 1 tablet (40 mg total) by mouth daily. 30 tablet 11  . polyethylene glycol (MIRALAX / GLYCOLAX) packet Take 17 g by mouth daily as needed for mild constipation.    . rosuvastatin (CRESTOR) 40 MG tablet TAKE 1 TABLET BY MOUTH ONCE DAILY 30 tablet 1  . vitamin C (ASCORBIC ACID) 500 MG tablet Take 500 mg by mouth daily.    . vitamin E 400 UNIT capsule Take 400 Units by mouth daily.    . furosemide (LASIX) 20 MG tablet take 1 tablet by  mouth once daily ALTERNATING WITH 2 TABLETS AS DIRECTED 60 tablet 9  . losartan (COZAAR) 100 MG tablet take 1 tablet by mouth once daily (Patient taking differently: TAKE 50MG  BY MOUTH ONCE DAILY) 30 tablet 7  . potassium chloride (K-DUR) 10 MEQ tablet TAKE 1-2 TABLETS BY MOUTH ONCE DAILY (Patient taking differently: TAKE 10MEQ BY MOUTH ONCE DAILY - ALTERNATING WITH 20MEQ) 60 tablet 6   Facility-Administered Medications Prior to Visit  Medication Dose Route Frequency Provider Last Rate Last Dose  . betamethasone acetate-betamethasone sodium phosphate (CELESTONE) injection 3 mg  3 mg Intramuscular Once Edrick Kins, DPM         Allergies:   Patient has no known allergies.   Social History   Social History  . Marital status: Married    Spouse name: N/A  . Number of children: N/A  . Years of education: N/A   Social History Main  Topics  . Smoking status: Never Smoker  . Smokeless tobacco: Former Systems developer  . Alcohol use Yes     Comment: daily wine  . Drug use: No  . Sexual activity: Not Asked   Other Topics Concern  . None   Social History Narrative  . None     ROS:   Please see the history of present illness.    ROS All other systems reviewed and are negative.   PHYSICAL EXAM:   VS:  BP (!) 141/65   Pulse 86   Ht 5\' 7"  (1.702 m)   Wt 263 lb (119.3 kg)   BMI 41.19 kg/m     General: Alert, oriented x3, no distress, Morbidly obese Head: no evidence of trauma, PERRL, EOMI, no exophtalmos or lid lag, no myxedema, no xanthelasma; normal ears, nose and oropharynx Neck: 6-8 centimeters elevation in jugular venous pulsations and prompt hepatojugular reflux; brisk carotid pulses without delay and no carotid bruits Chest: clear to auscultation, no signs of consolidation by percussion or palpation, normal fremitus, symmetrical and full respiratory excursions. Healthy left subclavian pacemaker site Cardiovascular: normal position and quality of the apical impulse, regular rhythm,  normal first and second heart sounds, no murmurs, rubs or gallops Abdomen: no tenderness or distention, no masses by palpation, no abnormal pulsatility or arterial bruits, normal bowel sounds, no hepatosplenomegaly Extremities: no clubbing, cyanosis, there is one-2+ symmetrical ankle edema; 2+ radial, ulnar and brachial pulses bilaterally; 2+ right femoral, posterior tibial and dorsalis pedis pulses; 2+ left femoral, posterior tibial and dorsalis pedis pulses; no subclavian or femoral bruits Neurological: grossly nonfocal Psych: Normal mood and affect  Wt Readings from Last 3 Encounters:  02/28/17 263 lb (119.3 kg)  04/11/16 260 lb (117.9 kg)  03/22/16 264 lb (119.7 kg)      Studies/Labs Reviewed:   EKG:  EKG is ordered today.  The ekg Shows atrial sensed, ventricular paced rhythm. AV delay is slightly prolonged at 210 ms. One PVC is seen. Paced QRS 158 ms, QTC is 483 ms Recent Labs: 11/09/2016: ALT 25; BUN 28; Creatinine, Ser 1.86; Hemoglobin 16.2; Platelets 142; Potassium 4.6; Sodium 137   Lipid Panel    Component Value Date/Time   CHOL 322 (H) 04/01/2015 0927   TRIG 501 (H) 04/01/2015 0927   HDL 26 (L) 04/01/2015 0927   CHOLHDL 12.4 (H) 04/01/2015 0927   VLDL NOT CALC 04/01/2015 0927   LDLCALC NOT CALC 04/01/2015 0927   LDLDIRECT 138 (H) 04/01/2015 0927   More recent lipid profile from October 4 shows total cholesterol 197, triglycerides 305, HDL 35, LDL 89 BUN 33, creatinine 1.83, hemoglobin A1c 7.6%, hemoglobin 16.2 normal liver function tests  ASSESSMENT:    1. Chronic combined systolic and diastolic heart failure ([place])   2. Medication management   3. Sinus node dysfunction   4. Second degree AV block   5. Pacemaker - dual chamber St. Jude RF   6. Coronary artery disease involving native coronary artery of native heart without angina pectoris   7. Type 2 diabetes mellitus with stage 3 chronic kidney disease, with long-term current use of insulin ([place])   8. Morbid  obesity ([place])   9. Mixed hyperlipidemia   10. CKD (chronic kidney disease) stage 3, GFR 30-59 ml/min ([facility])   11. OSA (obstructive sleep apnea)      PLAN:  In order of problems listed above:  1. CHF: Appears to have symptoms of heart failure, whereas at his last appointment he was essentially asymptomatic.  I'm not sure whether this is due to volume overload or the fact that he is trying to be more physically active. His weight hasn't really changed. Will increase his dose of diuretic. He is on an angiotensin receptor blocker, but believes it's causing him side effects. The doses of his heart failure medicines were reduced last year due to symptomatic hypotension. We'll switch him to an alternative ARB - valsartan with twice a day will, but is not currently available due to the recall. He is also on a beta blocker. Trying to avoid higher doses of beta blocker since this will lead to more ventricular pacing, which might be disadvantageous in the setting of systolic heart failure. 2. SSS: Heart rate histogram suggests satisfactory sensor settings 3. 2nd deg AVB: Has relatively infrequent ventricular pacing, but has a left bundle branch block and likely has dyssynchrony during both paced and natively conducted beats. If we cannot provide symptomatic improvement. We'll probably repeat an echo and assess suitability for CRT. 4. PPM: Continue remote download every 3 months and yearly office visit 5. CAD: s/p DES stenting proximal LAD stenosis and DX2 POBA, June, 2005 and staged RCA DES, August, 2005. Free of angina. Last functional study was in 2014. We'll definitely need to reevaluate if his EF has dropped. On the other hand would like to avoid contrast base procedures due to his renal insufficiency. 6. DM: ARB will also be beneficial for him to protect against progression of renal disease. 7. Morbid obesity: This puts him at a big disadvantage for both his metabolic and hemodynamic problems. 8. HLP: Not  quite at target LDL< 70, even though he is on very high-dose rosuvastatin. It would probably be preferable to cut the dose of rosuvastatin due to his renal insufficiency and add Zetia or a PCS K9 inhibitor, but this will make his treatment substantially more expensive. 9. CKD stage 3-4: Likely primarily due to diabetic nephropathy. Baseline creatinine seems to be around 1.8. Repeat labs after we have adjusted his diuretic and switched him to a different ARB. 10. OSA: Intolerant of CPAP    Medication Adjustments/Labs and Tests Ordered: Current medicines are reviewed at length with the patient today.  Concerns regarding medicines are outlined above.  Medication changes, Labs and Tests ordered today are listed in the Patient Instructions below. Patient Instructions  Dr Sallyanne Kuster has recommended making the following medication changes: 1. STOP Losartan 2. START Irbesartan 150 mg - take 1 tablet by mouth daily 3. INCREASE Furosemide to 40 mg daily 4. INCREASE Potassium to 20 mEq daily  Your physician recommends that you return for lab work in 1 month.  Remote monitoring is used to monitor your Pacemaker of ICD from home. This monitoring reduces the number of office visits required to check your device to one time per year. It allows [place] to keep an eye on the functioning of your device to ensure it is working properly. You are scheduled for a device check from home on Monday, January 7th, 2019. You may send your transmission at any time that day. If you have a wireless device, the transmission will be sent automatically. After your physician reviews your transmission, you will receive a postcard with your next transmission date.  Dr Sallyanne Kuster recommends that you schedule a follow-up appointment in 6 months with a pacemaker check. You will receive a reminder letter in the mail two months in advance. If you don't receive a letter, please call our office to schedule the follow-up appointment.  If  you need  a refill on your cardiac medications before your next appointment, please call your pharmacy.    Signed, Sanda Klein, MD  03/02/2017 6:53 PM    [address], [place], [place]  [address] Phone: [phone]; Fax: [phone]

## 2017-02-28 NOTE — Patient Instructions (Signed)
Dr Sallyanne Kuster has recommended making the following medication changes: 1. STOP Losartan 2. START Irbesartan 150 mg - take 1 tablet by mouth daily 3. INCREASE Furosemide to 40 mg daily 4. INCREASE Potassium to 20 mEq daily  Your physician recommends that you return for lab work in 1 month.  Remote monitoring is used to monitor your Pacemaker of ICD from home. This monitoring reduces the number of office visits required to check your device to one time per year. It allows [place] to keep an eye on the functioning of your device to ensure it is working properly. You are scheduled for a device check from home on Monday, January 7th, 2019. You may send your transmission at any time that day. If you have a wireless device, the transmission will be sent automatically. After your physician reviews your transmission, you will receive a postcard with your next transmission date.  Dr Sallyanne Kuster recommends that you schedule a follow-up appointment in 6 months with a pacemaker check. You will receive a reminder letter in the mail two months in advance. If you don't receive a letter, please call our office to schedule the follow-up appointment.  If you need a refill on your cardiac medications before your next appointment, please call your pharmacy.

## 2017-02-28 NOTE — Progress Notes (Signed)
Complaint:  Visit Type: Patient returns to my office for continued preventative foot care services. Complaint: Patient states" my nails have grown long and thick and become painful to walk and wear shoes" Patient has been diagnosed with DM with no foot complications. The patient presents for preventative foot care services. No changes to ROS  Podiatric Exam: Vascular: dorsalis pedis and posterior tibial pulses are palpable bilateral. Capillary return is immediate. Temperature gradient is WNL. Skin turgor WNL  Sensorium: Normal Semmes Weinstein monofilament test. Normal tactile sensation bilaterally. Nail Exam: Pt has thick disfigured discolored nails with subungual debris noted bilateral entire nail hallux through fifth toenails Ulcer Exam: There is no evidence of ulcer or pre-ulcerative changes or infection. Orthopedic Exam: Muscle tone and strength are WNL. No limitations in general ROM. No crepitus or effusions noted. Foot type and digits show no abnormalities. Bony prominences are unremarkable. Skin: No Porokeratosis. No infection or ulcers  Diagnosis:  Onychomycosis, , Pain in right toe, pain in left toes  Treatment & Plan Procedures and Treatment: Consent by patient was obtained for treatment procedures. The patient understood the discussion of treatment and procedures well. All questions were answered thoroughly reviewed. Debridement of mycotic and hypertrophic toenails, 1 through 5 bilateral and clearing of subungual debris. No ulceration, no infection noted. .ABN signed for 2018.  Return Visit-Office Procedure: Patient instructed to return to the office for a follow up visit 3 months for continued evaluation and treatment.    [name] DPM

## 2017-03-02 ENCOUNTER — Encounter: Payer: Self-pay | Admitting: Cardiology

## 2017-03-02 ENCOUNTER — Encounter: Payer: Self-pay | Admitting: Cardiovascular Disease

## 2017-03-05 ENCOUNTER — Other Ambulatory Visit: Payer: Self-pay | Admitting: Cardiovascular Disease

## 2017-03-13 ENCOUNTER — Other Ambulatory Visit: Payer: Self-pay | Admitting: Cardiovascular Disease

## 2017-03-13 LAB — CUP PACEART INCLINIC DEVICE CHECK
Battery Remaining Longevity: 55 mo
Battery Voltage: 2.86 V
Brady Statistic RA Percent Paced: 48 %
Brady Statistic RV Percent Paced: 33 %
Date Time Interrogation Session: 20181010135428
Implantable Lead Implant Date: 19901207
Implantable Lead Implant Date: 20111110
Implantable Lead Location: 753862
Implantable Lead Location: 753862
Implantable Pulse Generator Implant Date: 20111110
Lead Channel Impedance Value: 275 Ohm
Lead Channel Impedance Value: 550 Ohm
Lead Channel Pacing Threshold Amplitude: 1 V
Lead Channel Pacing Threshold Amplitude: 1 V
Lead Channel Pacing Threshold Amplitude: 1 V
Lead Channel Pacing Threshold Amplitude: 1 V
Lead Channel Pacing Threshold Pulse Width: 0.4 ms
Lead Channel Pacing Threshold Pulse Width: 0.4 ms
Lead Channel Pacing Threshold Pulse Width: 0.6 ms
Lead Channel Pacing Threshold Pulse Width: 0.6 ms
Lead Channel Sensing Intrinsic Amplitude: 10.2 mV
Lead Channel Sensing Intrinsic Amplitude: 4.6 mV
Lead Channel Setting Pacing Amplitude: 1.25 V
Lead Channel Setting Pacing Amplitude: 1.875
Lead Channel Setting Pacing Pulse Width: 0.6 ms
Lead Channel Setting Sensing Sensitivity: 3 mV
Pulse Gen Model: 2210
Pulse Gen Serial Number: 7174903

## 2017-03-19 LAB — CUP PACEART REMOTE DEVICE CHECK
Battery Remaining Longevity: 52 mo
Battery Remaining Percentage: 46 %
Battery Voltage: 2.86 V
Brady Statistic AP VP Percent: 16 %
Brady Statistic AP VS Percent: 32 %
Brady Statistic AS VP Percent: 16 %
Brady Statistic AS VS Percent: 34 %
Brady Statistic RA Percent Paced: 48 %
Brady Statistic RV Percent Paced: 33 %
Date Time Interrogation Session: 20181008063912
Implantable Lead Implant Date: 19901207
Implantable Lead Implant Date: 20111110
Implantable Lead Location: 753862
Implantable Lead Location: 753862
Implantable Pulse Generator Implant Date: 20111110
Lead Channel Impedance Value: 280 Ohm
Lead Channel Impedance Value: 540 Ohm
Lead Channel Pacing Threshold Amplitude: 0.875 V
Lead Channel Pacing Threshold Amplitude: 1.125 V
Lead Channel Pacing Threshold Pulse Width: 0.4 ms
Lead Channel Pacing Threshold Pulse Width: 0.6 ms
Lead Channel Sensing Intrinsic Amplitude: 5 mV
Lead Channel Sensing Intrinsic Amplitude: 9 mV
Lead Channel Setting Pacing Amplitude: 1.375
Lead Channel Setting Pacing Amplitude: 1.875
Lead Channel Setting Pacing Pulse Width: 0.6 ms
Lead Channel Setting Sensing Sensitivity: 3 mV
Pulse Gen Model: 2210
Pulse Gen Serial Number: 7174903

## 2017-03-22 ENCOUNTER — Other Ambulatory Visit: Payer: Self-pay | Admitting: Cardiovascular Disease

## 2017-03-24 ENCOUNTER — Other Ambulatory Visit: Payer: Self-pay | Admitting: Cardiovascular Disease

## 2017-03-26 DIAGNOSIS — M7021 Olecranon bursitis, right elbow: Secondary | ICD-10-CM | POA: Diagnosis not present

## 2017-03-26 NOTE — Telephone Encounter (Signed)
REFILL 

## 2017-04-13 ENCOUNTER — Other Ambulatory Visit: Payer: Self-pay | Admitting: Cardiovascular Disease

## 2017-04-16 NOTE — Telephone Encounter (Signed)
Rx(s) sent to pharmacy electronically.  

## 2017-04-20 ENCOUNTER — Other Ambulatory Visit: Payer: Self-pay | Admitting: Cardiovascular Disease

## 2017-05-04 DIAGNOSIS — Z85828 Personal history of other malignant neoplasm of skin: Secondary | ICD-10-CM | POA: Diagnosis not present

## 2017-05-04 DIAGNOSIS — D224 Melanocytic nevi of scalp and neck: Secondary | ICD-10-CM | POA: Diagnosis not present

## 2017-05-04 DIAGNOSIS — D2272 Melanocytic nevi of left lower limb, including hip: Secondary | ICD-10-CM | POA: Diagnosis not present

## 2017-05-04 DIAGNOSIS — L218 Other seborrheic dermatitis: Secondary | ICD-10-CM | POA: Diagnosis not present

## 2017-05-04 DIAGNOSIS — L821 Other seborrheic keratosis: Secondary | ICD-10-CM | POA: Diagnosis not present

## 2017-05-10 DIAGNOSIS — Z6841 Body Mass Index (BMI) 40.0 and over, adult: Secondary | ICD-10-CM | POA: Diagnosis not present

## 2017-05-10 DIAGNOSIS — N2581 Secondary hyperparathyroidism of renal origin: Secondary | ICD-10-CM | POA: Diagnosis not present

## 2017-05-10 DIAGNOSIS — N183 Chronic kidney disease, stage 3 (moderate): Secondary | ICD-10-CM | POA: Diagnosis not present

## 2017-05-10 DIAGNOSIS — E1121 Type 2 diabetes mellitus with diabetic nephropathy: Secondary | ICD-10-CM | POA: Diagnosis not present

## 2017-05-10 DIAGNOSIS — I129 Hypertensive chronic kidney disease with stage 1 through stage 4 chronic kidney disease, or unspecified chronic kidney disease: Secondary | ICD-10-CM | POA: Diagnosis not present

## 2017-05-18 DIAGNOSIS — Z79899 Other long term (current) drug therapy: Secondary | ICD-10-CM | POA: Diagnosis not present

## 2017-05-18 DIAGNOSIS — N183 Chronic kidney disease, stage 3 (moderate): Secondary | ICD-10-CM | POA: Diagnosis not present

## 2017-05-18 DIAGNOSIS — E039 Hypothyroidism, unspecified: Secondary | ICD-10-CM | POA: Diagnosis not present

## 2017-05-18 DIAGNOSIS — I2510 Atherosclerotic heart disease of native coronary artery without angina pectoris: Secondary | ICD-10-CM | POA: Diagnosis not present

## 2017-05-18 DIAGNOSIS — I5042 Chronic combined systolic (congestive) and diastolic (congestive) heart failure: Secondary | ICD-10-CM | POA: Diagnosis not present

## 2017-05-18 DIAGNOSIS — E1142 Type 2 diabetes mellitus with diabetic polyneuropathy: Secondary | ICD-10-CM | POA: Diagnosis not present

## 2017-05-18 DIAGNOSIS — Z0000 Encounter for general adult medical examination without abnormal findings: Secondary | ICD-10-CM | POA: Diagnosis not present

## 2017-05-18 DIAGNOSIS — I495 Sick sinus syndrome: Secondary | ICD-10-CM | POA: Diagnosis not present

## 2017-05-18 DIAGNOSIS — Z794 Long term (current) use of insulin: Secondary | ICD-10-CM | POA: Diagnosis not present

## 2017-05-18 DIAGNOSIS — E1165 Type 2 diabetes mellitus with hyperglycemia: Secondary | ICD-10-CM | POA: Diagnosis not present

## 2017-05-18 DIAGNOSIS — M5136 Other intervertebral disc degeneration, lumbar region: Secondary | ICD-10-CM | POA: Diagnosis not present

## 2017-05-18 DIAGNOSIS — Z950 Presence of cardiac pacemaker: Secondary | ICD-10-CM | POA: Diagnosis not present

## 2017-05-21 ENCOUNTER — Other Ambulatory Visit: Payer: Self-pay

## 2017-05-21 MED ORDER — ROSUVASTATIN CALCIUM 40 MG PO TABS: 40.0000 mg | ORAL_TABLET | Freq: Every day | ORAL | 1 refills | Status: DC

## 2017-05-28 ENCOUNTER — Ambulatory Visit (INDEPENDENT_AMBULATORY_CARE_PROVIDER_SITE_OTHER): Payer: Medicare Other | Admitting: *Deleted

## 2017-05-28 DIAGNOSIS — I495 Sick sinus syndrome: Secondary | ICD-10-CM

## 2017-05-28 NOTE — Progress Notes (Signed)
Remote pacemaker transmission.   

## 2017-05-29 ENCOUNTER — Encounter: Payer: Self-pay | Admitting: Cardiology

## 2017-05-30 ENCOUNTER — Encounter: Payer: Self-pay | Admitting: Podiatry

## 2017-05-30 ENCOUNTER — Ambulatory Visit (INDEPENDENT_AMBULATORY_CARE_PROVIDER_SITE_OTHER): Payer: Medicare Other | Admitting: Podiatry

## 2017-05-30 DIAGNOSIS — D689 Coagulation defect, unspecified: Secondary | ICD-10-CM

## 2017-05-30 DIAGNOSIS — E1140 Type 2 diabetes mellitus with diabetic neuropathy, unspecified: Secondary | ICD-10-CM

## 2017-05-30 DIAGNOSIS — B351 Tinea unguium: Secondary | ICD-10-CM

## 2017-05-30 DIAGNOSIS — Z794 Long term (current) use of insulin: Secondary | ICD-10-CM

## 2017-05-30 NOTE — Progress Notes (Signed)
Complaint:  Visit Type: Patient returns to my office for continued preventative foot care services. Complaint: Patient states" my nails have grown long and thick and become painful to walk and wear shoes" Patient has been diagnosed with DM with no foot complications. The patient presents for preventative foot care services. No changes to ROS.  Patient is taking plavix.  Podiatric Exam: Vascular: dorsalis pedis and posterior tibial pulses are palpable bilateral. Capillary return is immediate. Temperature gradient is WNL. Skin turgor WNL  Sensorium: Normal Semmes Weinstein monofilament test. Normal tactile sensation bilaterally. Nail Exam: Pt has thick disfigured discolored nails with subungual debris noted bilateral entire nail hallux through fifth toenails Ulcer Exam: There is no evidence of ulcer or pre-ulcerative changes or infection. Orthopedic Exam: Muscle tone and strength are WNL. No limitations in general ROM. No crepitus or effusions noted. Foot type and digits show no abnormalities. Bony prominences are unremarkable. Skin: No Porokeratosis. No infection or ulcers  Diagnosis:  Onychomycosis, , Pain in right toe, pain in left toes  Treatment & Plan Procedures and Treatment: Consent by patient was obtained for treatment procedures. The patient understood the discussion of treatment and procedures well. All questions were answered thoroughly reviewed. Debridement of mycotic and hypertrophic toenails, 1 through 5 bilateral and clearing of subungual debris. No ulceration, no infection noted. .ABN signed for 2019.  Return Visit-Office Procedure: Patient instructed to return to the office for a follow up visit 3 months for continued evaluation and treatment.    [place]pet[place] Elling DPM 

## 2017-06-02 LAB — CUP PACEART REMOTE DEVICE CHECK
Battery Remaining Longevity: 43 mo
Battery Remaining Percentage: 40 %
Battery Voltage: 2.84 V
Brady Statistic AP VP Percent: 29 %
Brady Statistic AP VS Percent: 21 %
Brady Statistic AS VP Percent: 29 %
Brady Statistic AS VS Percent: 21 %
Brady Statistic RA Percent Paced: 49 %
Brady Statistic RV Percent Paced: 57 %
Date Time Interrogation Session: 20190107084421
Implantable Lead Implant Date: 19901207
Implantable Lead Implant Date: 20111110
Implantable Lead Location: 753862
Implantable Lead Location: 753862
Implantable Pulse Generator Implant Date: 20111110
Lead Channel Impedance Value: 280 Ohm
Lead Channel Impedance Value: 540 Ohm
Lead Channel Pacing Threshold Amplitude: 0.875 V
Lead Channel Pacing Threshold Amplitude: 1 V
Lead Channel Pacing Threshold Pulse Width: 0.4 ms
Lead Channel Pacing Threshold Pulse Width: 0.6 ms
Lead Channel Sensing Intrinsic Amplitude: 5 mV
Lead Channel Sensing Intrinsic Amplitude: 8.5 mV
Lead Channel Setting Pacing Amplitude: 1.25 V
Lead Channel Setting Pacing Amplitude: 1.875
Lead Channel Setting Pacing Pulse Width: 0.6 ms
Lead Channel Setting Sensing Sensitivity: 3 mV
Pulse Gen Model: 2210
Pulse Gen Serial Number: 7174903

## 2017-06-04 DIAGNOSIS — E119 Type 2 diabetes mellitus without complications: Secondary | ICD-10-CM | POA: Diagnosis not present

## 2017-06-19 DIAGNOSIS — Z794 Long term (current) use of insulin: Secondary | ICD-10-CM | POA: Diagnosis not present

## 2017-06-19 DIAGNOSIS — Z6841 Body Mass Index (BMI) 40.0 and over, adult: Secondary | ICD-10-CM | POA: Diagnosis not present

## 2017-06-19 DIAGNOSIS — E039 Hypothyroidism, unspecified: Secondary | ICD-10-CM | POA: Diagnosis not present

## 2017-06-19 DIAGNOSIS — E1142 Type 2 diabetes mellitus with diabetic polyneuropathy: Secondary | ICD-10-CM | POA: Diagnosis not present

## 2017-06-19 DIAGNOSIS — N183 Chronic kidney disease, stage 3 (moderate): Secondary | ICD-10-CM | POA: Diagnosis not present

## 2017-06-27 ENCOUNTER — Other Ambulatory Visit: Payer: Self-pay

## 2017-06-27 MED ORDER — NITROGLYCERIN 0.4 MG SL SUBL: 0.4000 mg | SUBLINGUAL_TABLET | SUBLINGUAL | 3 refills | Status: DC | PRN

## 2017-08-08 ENCOUNTER — Encounter: Payer: Self-pay | Admitting: Podiatry

## 2017-08-08 ENCOUNTER — Ambulatory Visit (INDEPENDENT_AMBULATORY_CARE_PROVIDER_SITE_OTHER): Payer: Medicare Other | Admitting: Podiatry

## 2017-08-08 DIAGNOSIS — B351 Tinea unguium: Secondary | ICD-10-CM

## 2017-08-08 DIAGNOSIS — E1140 Type 2 diabetes mellitus with diabetic neuropathy, unspecified: Secondary | ICD-10-CM

## 2017-08-08 DIAGNOSIS — D689 Coagulation defect, unspecified: Secondary | ICD-10-CM

## 2017-08-08 DIAGNOSIS — Z794 Long term (current) use of insulin: Secondary | ICD-10-CM

## 2017-08-08 NOTE — Progress Notes (Signed)
Complaint:  Visit Type: Patient returns to my office for continued preventative foot care services. Complaint: Patient states" my nails have grown long and thick and become painful to walk and wear shoes" Patient has been diagnosed with DM with no foot complications. The patient presents for preventative foot care services. No changes to ROS.  Patient is taking plavix.  Podiatric Exam: Vascular: dorsalis pedis and posterior tibial pulses are palpable bilateral. Capillary return is immediate. Temperature gradient is WNL. Skin turgor WNL  Sensorium: Normal Semmes Weinstein monofilament test. Normal tactile sensation bilaterally. Nail Exam: Pt has thick disfigured discolored nails with subungual debris noted bilateral entire nail hallux through fifth toenails Ulcer Exam: There is no evidence of ulcer or pre-ulcerative changes or infection. Orthopedic Exam: Muscle tone and strength are WNL. No limitations in general ROM. No crepitus or effusions noted. Foot type and digits show no abnormalities. Bony prominences are unremarkable. Skin: No Porokeratosis. No infection or ulcers  Diagnosis:  Onychomycosis, , Pain in right toe, pain in left toes  Treatment & Plan Procedures and Treatment: Consent by patient was obtained for treatment procedures. The patient understood the discussion of treatment and procedures well. All questions were answered thoroughly reviewed. Debridement of mycotic and hypertrophic toenails, 1 through 5 bilateral and clearing of subungual debris. No ulceration, no infection noted. .ABN signed for 2019.  Return Visit-Office Procedure: Patient instructed to return to the office for a follow up visit 3 months for continued evaluation and treatment.    Keshayla Schrum DPM 

## 2017-08-27 ENCOUNTER — Ambulatory Visit (INDEPENDENT_AMBULATORY_CARE_PROVIDER_SITE_OTHER): Payer: Medicare Other | Admitting: *Deleted

## 2017-08-27 DIAGNOSIS — I495 Sick sinus syndrome: Secondary | ICD-10-CM

## 2017-08-28 NOTE — Progress Notes (Signed)
Remote pacemaker transmission.   

## 2017-08-29 ENCOUNTER — Encounter: Payer: Self-pay | Admitting: Cardiology

## 2017-09-19 LAB — CUP PACEART REMOTE DEVICE CHECK
Battery Remaining Longevity: 38 mo
Battery Remaining Percentage: 35 %
Battery Voltage: 2.83 V
Brady Statistic AP VP Percent: 30 %
Brady Statistic AP VS Percent: 15 %
Brady Statistic AS VP Percent: 40 %
Brady Statistic AS VS Percent: 15 %
Brady Statistic RA Percent Paced: 44 %
Brady Statistic RV Percent Paced: 70 %
Date Time Interrogation Session: 20190408111315
Implantable Lead Implant Date: 19901207
Implantable Lead Implant Date: 20111110
Implantable Lead Location: 753862
Implantable Lead Location: 753862
Implantable Pulse Generator Implant Date: 20111110
Lead Channel Impedance Value: 280 Ohm
Lead Channel Impedance Value: 540 Ohm
Lead Channel Pacing Threshold Amplitude: 0.875 V
Lead Channel Pacing Threshold Amplitude: 1 V
Lead Channel Pacing Threshold Pulse Width: 0.4 ms
Lead Channel Pacing Threshold Pulse Width: 0.6 ms
Lead Channel Sensing Intrinsic Amplitude: 4.9 mV
Lead Channel Sensing Intrinsic Amplitude: 8.5 mV
Lead Channel Setting Pacing Amplitude: 1.25 V
Lead Channel Setting Pacing Amplitude: 1.875
Lead Channel Setting Pacing Pulse Width: 0.6 ms
Lead Channel Setting Sensing Sensitivity: 3 mV
Pulse Gen Model: 2210
Pulse Gen Serial Number: 7174903

## 2017-09-23 ENCOUNTER — Other Ambulatory Visit: Payer: Self-pay | Admitting: Cardiovascular Disease

## 2017-09-24 NOTE — Telephone Encounter (Signed)
REFILL 

## 2017-11-07 ENCOUNTER — Ambulatory Visit (INDEPENDENT_AMBULATORY_CARE_PROVIDER_SITE_OTHER): Payer: Medicare Other | Admitting: Podiatry

## 2017-11-07 ENCOUNTER — Encounter: Payer: Self-pay | Admitting: Podiatry

## 2017-11-07 DIAGNOSIS — B351 Tinea unguium: Secondary | ICD-10-CM

## 2017-11-07 DIAGNOSIS — D689 Coagulation defect, unspecified: Secondary | ICD-10-CM

## 2017-11-07 DIAGNOSIS — E1140 Type 2 diabetes mellitus with diabetic neuropathy, unspecified: Secondary | ICD-10-CM

## 2017-11-07 DIAGNOSIS — M79676 Pain in unspecified toe(s): Secondary | ICD-10-CM

## 2017-11-07 DIAGNOSIS — Z794 Long term (current) use of insulin: Secondary | ICD-10-CM

## 2017-11-07 NOTE — Progress Notes (Signed)
Complaint:  Visit Type: Patient returns to my office for continued preventative foot care services. Complaint: Patient states" my nails have grown long and thick and become painful to walk and wear shoes" Patient has been diagnosed with DM with no foot complications. The patient presents for preventative foot care services. No changes to ROS.  Patient is taking plavix.  Podiatric Exam: Vascular: dorsalis pedis and posterior tibial pulses are palpable bilateral. Capillary return is immediate. Temperature gradient is WNL. Skin turgor WNL  Sensorium: Normal Semmes Weinstein monofilament test. Normal tactile sensation bilaterally. Nail Exam: Pt has thick disfigured discolored nails with subungual debris noted bilateral entire nail hallux through fifth toenails Ulcer Exam: There is no evidence of ulcer or pre-ulcerative changes or infection. Orthopedic Exam: Muscle tone and strength are WNL. No limitations in general ROM. No crepitus or effusions noted. Foot type and digits show no abnormalities. Bony prominences are unremarkable. Skin: No Porokeratosis. No infection or ulcers  Diagnosis:  Onychomycosis, , Pain in right toe, pain in left toes  Treatment & Plan Procedures and Treatment: Consent by patient was obtained for treatment procedures. The patient understood the discussion of treatment and procedures well. All questions were answered thoroughly reviewed. Debridement of mycotic and hypertrophic toenails, 1 through 5 bilateral and clearing of subungual debris. No ulceration, no infection noted. .ABN signed for 2019.  Return Visit-Office Procedure: Patient instructed to return to the office for a follow up visit 3 months for continued evaluation and treatment.    Dyer Klug DPM 

## 2017-11-15 DIAGNOSIS — E1142 Type 2 diabetes mellitus with diabetic polyneuropathy: Secondary | ICD-10-CM | POA: Diagnosis not present

## 2017-11-15 DIAGNOSIS — N183 Chronic kidney disease, stage 3 (moderate): Secondary | ICD-10-CM | POA: Diagnosis not present

## 2017-11-15 DIAGNOSIS — E039 Hypothyroidism, unspecified: Secondary | ICD-10-CM | POA: Diagnosis not present

## 2017-11-15 DIAGNOSIS — Z794 Long term (current) use of insulin: Secondary | ICD-10-CM | POA: Diagnosis not present

## 2017-11-15 DIAGNOSIS — Z6841 Body Mass Index (BMI) 40.0 and over, adult: Secondary | ICD-10-CM | POA: Diagnosis not present

## 2017-11-26 ENCOUNTER — Ambulatory Visit (INDEPENDENT_AMBULATORY_CARE_PROVIDER_SITE_OTHER): Payer: Medicare Other | Admitting: *Deleted

## 2017-11-26 DIAGNOSIS — I495 Sick sinus syndrome: Secondary | ICD-10-CM

## 2017-11-27 NOTE — Progress Notes (Signed)
Remote pacemaker transmission.   

## 2017-12-03 DIAGNOSIS — H26493 Other secondary cataract, bilateral: Secondary | ICD-10-CM | POA: Diagnosis not present

## 2017-12-03 DIAGNOSIS — H52222 Regular astigmatism, left eye: Secondary | ICD-10-CM | POA: Diagnosis not present

## 2017-12-03 DIAGNOSIS — Z961 Presence of intraocular lens: Secondary | ICD-10-CM | POA: Diagnosis not present

## 2017-12-03 DIAGNOSIS — H5201 Hypermetropia, right eye: Secondary | ICD-10-CM | POA: Diagnosis not present

## 2017-12-03 DIAGNOSIS — H524 Presbyopia: Secondary | ICD-10-CM | POA: Diagnosis not present

## 2017-12-03 DIAGNOSIS — E119 Type 2 diabetes mellitus without complications: Secondary | ICD-10-CM | POA: Diagnosis not present

## 2017-12-04 LAB — CUP PACEART REMOTE DEVICE CHECK
Battery Remaining Longevity: 28 mo
Battery Remaining Percentage: 25 %
Battery Voltage: 2.8 V
Brady Statistic AP VP Percent: 32 %
Brady Statistic AP VS Percent: 9.8 %
Brady Statistic AS VP Percent: 48 %
Brady Statistic AS VS Percent: 9.9 %
Brady Statistic RA Percent Paced: 41 %
Brady Statistic RV Percent Paced: 80 %
Date Time Interrogation Session: 20190708074330
Implantable Lead Implant Date: 19901207
Implantable Lead Implant Date: 20111110
Implantable Lead Location: 753862
Implantable Lead Location: 753862
Implantable Pulse Generator Implant Date: 20111110
Lead Channel Impedance Value: 280 Ohm
Lead Channel Impedance Value: 540 Ohm
Lead Channel Pacing Threshold Amplitude: 0.875 V
Lead Channel Pacing Threshold Amplitude: 1 V
Lead Channel Pacing Threshold Pulse Width: 0.4 ms
Lead Channel Pacing Threshold Pulse Width: 0.6 ms
Lead Channel Sensing Intrinsic Amplitude: 12 mV
Lead Channel Sensing Intrinsic Amplitude: 4.7 mV
Lead Channel Setting Pacing Amplitude: 1.25 V
Lead Channel Setting Pacing Amplitude: 1.875
Lead Channel Setting Pacing Pulse Width: 0.6 ms
Lead Channel Setting Sensing Sensitivity: 3 mV
Pulse Gen Model: 2210
Pulse Gen Serial Number: 7174903

## 2017-12-20 DIAGNOSIS — T68XXXA Hypothermia, initial encounter: Secondary | ICD-10-CM | POA: Diagnosis not present

## 2017-12-20 DIAGNOSIS — G479 Sleep disorder, unspecified: Secondary | ICD-10-CM | POA: Diagnosis not present

## 2017-12-20 DIAGNOSIS — R05 Cough: Secondary | ICD-10-CM | POA: Diagnosis not present

## 2017-12-26 DIAGNOSIS — D631 Anemia in chronic kidney disease: Secondary | ICD-10-CM | POA: Diagnosis not present

## 2017-12-26 DIAGNOSIS — E039 Hypothyroidism, unspecified: Secondary | ICD-10-CM | POA: Diagnosis not present

## 2017-12-26 DIAGNOSIS — I495 Sick sinus syndrome: Secondary | ICD-10-CM | POA: Diagnosis not present

## 2017-12-26 DIAGNOSIS — E785 Hyperlipidemia, unspecified: Secondary | ICD-10-CM | POA: Diagnosis not present

## 2017-12-26 DIAGNOSIS — I2510 Atherosclerotic heart disease of native coronary artery without angina pectoris: Secondary | ICD-10-CM | POA: Diagnosis not present

## 2017-12-26 DIAGNOSIS — N183 Chronic kidney disease, stage 3 (moderate): Secondary | ICD-10-CM | POA: Diagnosis not present

## 2017-12-26 DIAGNOSIS — N2581 Secondary hyperparathyroidism of renal origin: Secondary | ICD-10-CM | POA: Diagnosis not present

## 2017-12-26 DIAGNOSIS — I509 Heart failure, unspecified: Secondary | ICD-10-CM | POA: Diagnosis not present

## 2017-12-26 DIAGNOSIS — E1122 Type 2 diabetes mellitus with diabetic chronic kidney disease: Secondary | ICD-10-CM | POA: Diagnosis not present

## 2017-12-26 DIAGNOSIS — C61 Malignant neoplasm of prostate: Secondary | ICD-10-CM | POA: Diagnosis not present

## 2018-01-30 ENCOUNTER — Encounter: Payer: Self-pay | Admitting: Cardiovascular Disease

## 2018-01-30 ENCOUNTER — Ambulatory Visit (INDEPENDENT_AMBULATORY_CARE_PROVIDER_SITE_OTHER): Payer: Medicare Other | Admitting: Cardiovascular Disease

## 2018-01-30 VITALS — BP 142/70 | HR 72 | Ht 67.0 in | Wt 269.4 lb

## 2018-01-30 DIAGNOSIS — I10 Essential (primary) hypertension: Secondary | ICD-10-CM

## 2018-01-30 DIAGNOSIS — N183 Chronic kidney disease, stage 3 unspecified: Secondary | ICD-10-CM

## 2018-01-30 DIAGNOSIS — I495 Sick sinus syndrome: Secondary | ICD-10-CM

## 2018-01-30 DIAGNOSIS — I25810 Atherosclerosis of coronary artery bypass graft(s) without angina pectoris: Secondary | ICD-10-CM

## 2018-01-30 DIAGNOSIS — E1169 Type 2 diabetes mellitus with other specified complication: Secondary | ICD-10-CM | POA: Diagnosis not present

## 2018-01-30 DIAGNOSIS — E669 Obesity, unspecified: Secondary | ICD-10-CM

## 2018-01-30 DIAGNOSIS — I441 Atrioventricular block, second degree: Secondary | ICD-10-CM | POA: Diagnosis not present

## 2018-01-30 DIAGNOSIS — G4733 Obstructive sleep apnea (adult) (pediatric): Secondary | ICD-10-CM | POA: Diagnosis not present

## 2018-01-30 DIAGNOSIS — E7800 Pure hypercholesterolemia, unspecified: Secondary | ICD-10-CM

## 2018-01-30 DIAGNOSIS — Z950 Presence of cardiac pacemaker: Secondary | ICD-10-CM

## 2018-01-30 DIAGNOSIS — I5042 Chronic combined systolic (congestive) and diastolic (congestive) heart failure: Secondary | ICD-10-CM

## 2018-01-30 DIAGNOSIS — E119 Type 2 diabetes mellitus without complications: Secondary | ICD-10-CM

## 2018-01-30 NOTE — Patient Instructions (Signed)
Dr Sallyanne Kuster recommends that you continue on your current medications as directed. Please refer to the Current Medication list given to you today.  Remote monitoring is used to monitor your Pacemaker or ICD from home. This monitoring reduces the number of office visits required to check your device to one time per year. It allows [place] to keep an eye on the functioning of your device to ensure it is working properly. You are scheduled for a device check from home on Monday, October 7th, 2019. You may send your transmission at any time that day. If you have a wireless device, the transmission will be sent automatically. After your physician reviews your transmission, you will receive a postcard with your next transmission date.  To improve our patient care and to more adequately follow your device, [facility] has decided, as a practice, to start following each patient four times a year with your home monitor. This means that you may experience a remote appointment that is close to an in-office appointment with your physician. Your insurance will apply at the same rate as other remote monitoring transmissions.  Dr Sallyanne Kuster recommends that you schedule a follow-up appointment in 6 months with a pacemaker check. You will receive a reminder letter in the mail two months in advance. If you don't receive a letter, please call our office to schedule the follow-up appointment.  If you need a refill on your cardiac medications before your next appointment, please call your pharmacy.

## 2018-01-31 NOTE — Progress Notes (Signed)
Cardiology Office Note    Date:  01/31/2018   ID:  [name], DOB 05-16-1932, MRN [id number]  PCP:  William Jordan, MD  Cardiologist:   William Klein, MD   Chief Complaint  Patient presents with  . Congestive Heart Failure  . Coronary Artery Disease  . Pacemaker Check    History of Present Illness:  [name] is a 84 y.o. 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 The device was initially implanted in 1990, with generator changes in 2002 in 2011. Additional problems include minor nonobstructive coronary atherosclerosis, insulin requiring type 2 diabetes mellitus, hyperlipidemia, hypertension in the setting of morbid obesity.  He has become more sedentary as his joint problems have become more limiting.  He has no cardiovascular complaints.  The patient specifically denies any chest pain at rest or exertion, dyspnea at rest, orthopnea, paroxysmal nocturnal dyspnea, syncope, palpitations, focal neurological deficits, intermittent claudication, lower extremity edema, unexplained weight gain, cough, hemoptysis or wheezing.  Probably has NYHA functional class II exertional dyspnea.  Pacemaker interrogation shows normal device function. Estimated generator longevity is 2 years, lead parameters are all excellent. There is 41 % atrial pacing and 84 % ventricular pacing, with increased ventricular pacing compared to past downloads.  He has not had any atrial fibrillation or ventricular tachycardia and has very rare episodes of atrial mode switch, mostly due to competitive sensor pacing, some true PAT but very brief.  After Dr. Mercy Moore retired he now sees Dr. Edrick Oh  He underwent staged LAD and RCA revascularization with drug-eluting stents in 2005 and has not required subsequent percutaneous revascularization procedures. His most recent nuclear study in January 2014 showed an EF of 34% with a fixed inferior  defect and no reversible ischemia. I'm not sure about the accuracy of left ventricular ejection fraction assessment. His most recent echocardiogram performed around the same date showed a mildly depressed LVEF of 45-50% (note the presence of right ventricular pacing which may alter the accuracy of EF assessment). 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  Past Medical History:  Diagnosis Date  . Abnormal Doppler ultrasound of carotid artery 12/16/2012   mildly abnormal doppler . no prior studies to compare to. follow up studies are recommended when clinically indicated.  Marland Kitchen Blepharitis   . Cancer ([place])   . Diabetes mellitus without complication ([place])   . Exogenous obesity   . Gastric ulcer   . GI bleed   . History of stress test 06/06/2012   low risk scan. fixed inferior defect consistant with prior infarction with moderatly reduced EF. no appreciable evidence of ischemia. no significant change from previous study  . Hx of echocardiogram 08-15-2012   EF  45-50 % very difficult study, severe LVH, pacemaker, trace TR,mildly enlarged RV,; aorta mildly calcified  . Pacemaker 2011   St. Jude Accent DDDR ; this is the third generator this pt. has had  . Shingles   . UTI (lower urinary tract infection)     Past Surgical History:  Procedure Laterality Date  . CARDIAC CATHETERIZATION  12-29-2003   rt. coronary angiography was done with a 6 french 4 cm taper coronary cath. This  demonstrated  approximately 70 to 75% mildly segmental stenosis in the mid RCA before the acute margin          . CARDIAC CATHETERIZATION  11-19-2003   diagnostic cath,, pt. to get second cath to have stent placed  . cardiac stents    .  CATARACT EXTRACTION    . KNEE SURGERY Right   . PROSTATE BIOPSY    . prostate seed implants    . SPINE SURGERY    . WISDOM TOOTH EXTRACTION      Current Medications: Outpatient Medications Prior to Visit  Medication Sig Dispense Refill  . aspirin 81 MG tablet Take 81 mg by mouth daily.    . bisacodyl (DULCOLAX) 5 MG EC tablet Take 5 mg by mouth daily as needed for moderate constipation.    . calcitRIOL (ROCALTROL) 0.25 MCG capsule Take 0.25 mcg by mouth every Monday, Wednesday, and Friday.    . cholecalciferol (VITAMIN D) 1000 UNITS tablet Take 2,000 Units by mouth daily.    . clopidogrel (PLAVIX) 75 MG tablet TAKE 1 TABLET BY MOUTH ONCE DAILY 90 tablet 3  . Coenzyme Q10 (COQ-10) 150 MG CAPS Take 1 capsule by mouth daily.     . furosemide (LASIX) 40 MG tablet Take 1 tablet (40 mg total) by mouth daily. 90 tablet 3  . insulin NPH-regular Human (NOVOLIN 70/30) (70-30) 100 UNIT/ML injection Inject 66 Units into the skin 2 (two) times daily with a meal.     . irbesartan (AVAPRO) 150 MG tablet Take 1 tablet (150 mg total) by mouth daily. 90 tablet 3  . levothyroxine (SYNTHROID, LEVOTHROID) 25 MCG tablet Take 25 mcg by mouth daily before breakfast.     . metoprolol succinate (TOPROL-XL) 25 MG 24 hr tablet Take 1.5 tablets (37.5 mg total) by mouth daily. 135 tablet 3  . nitroGLYCERIN (NITROSTAT) 0.4 MG SL tablet Place 1 tablet (0.4 mg total) under the tongue every 5 (five) minutes as needed for chest pain. 25 tablet 3  . Omega-3 Fatty Acids (FISH OIL) 1000 MG CAPS Take 2,400 mg by mouth daily.     . pantoprazole (PROTONIX) 40 MG tablet TAKE 1 TABLET BY MOUTH ONCE DAILY 90 tablet 3  . polyethylene glycol (MIRALAX / GLYCOLAX) packet Take 17 g by mouth daily as needed for mild constipation.    . potassium chloride (K-DUR) 10 MEQ tablet Take 2 tablets (20 mEq total) by mouth daily. 180 tablet 3  . rosuvastatin (CRESTOR) 40 MG tablet TAKE 1 TABLET BY MOUTH ONCE DAILY 90 tablet 1  . vitamin C  (ASCORBIC ACID) 500 MG tablet Take 500 mg by mouth daily.    . vitamin E 400 UNIT capsule Take 400 Units by mouth daily.     Facility-Administered Medications Prior to Visit  Medication Dose Route Frequency Provider Last Rate Last Dose  . betamethasone acetate-betamethasone sodium phosphate (CELESTONE) injection 3 mg  3 mg Intramuscular Once Edrick Kins, DPM         Allergies:   Patient has no known allergies.   Social History   Socioeconomic History  . Marital status: Married    Spouse name: Not on file  . Number of children: Not on file  . Years of education: Not on file  . Highest education level: Not on file  Occupational History  . Not on file  Social Needs  . Financial resource strain: Not on file  . Food insecurity:    Worry: Not on file  Inability: Not on file  . Transportation needs:    Medical: Not on file    Non-medical: Not on file  Tobacco Use  . Smoking status: Never Smoker  . Smokeless tobacco: Former Network engineer and Sexual Activity  . Alcohol use: Yes    Comment: daily wine  . Drug use: No  . Sexual activity: Not on file  Lifestyle  . Physical activity:    Days per week: Not on file    Minutes per session: Not on file  . Stress: Not on file  Relationships  . Social connections:    Talks on phone: Not on file    Gets together: Not on file    Attends religious service: Not on file    Active member of club or organization: Not on file    Attends meetings of clubs or organizations: Not on file    Relationship status: Not on file  Other Topics Concern  . Not on file  Social History Narrative  . Not on file     ROS:   Please see the history of present illness.    ROS All systems reviewed and are negative  PHYSICAL EXAM:   VS:  BP (!) 142/70   Pulse 72   Ht 5\' 7"  (1.702 m)   Wt 269 lb 6.4 oz (122.2 kg)   BMI 42.19 kg/m      General: Alert, oriented x3, no distress, morbidly obese Head: no evidence of trauma, PERRL, EOMI, no  exophtalmos or lid lag, no myxedema, no xanthelasma; normal ears, nose and oropharynx Neck: normal jugular venous pulsations and no hepatojugular reflux; brisk carotid pulses without delay and no carotid bruits Chest: clear to auscultation, no signs of consolidation by percussion or palpation, normal fremitus, symmetrical and full respiratory excursions Cardiovascular: normal position and quality of the apical impulse, regular rhythm, normal first and paradoxically split second heart sounds, no murmurs, rubs or gallops Abdomen: no tenderness or distention, no masses by palpation, no abnormal pulsatility or arterial bruits, normal bowel sounds, no hepatosplenomegaly Extremities: no clubbing, cyanosis or edema; 2+ radial, ulnar and brachial pulses bilaterally; 2+ right femoral, posterior tibial and dorsalis pedis pulses; 2+ left femoral, posterior tibial and dorsalis pedis pulses; no subclavian or femoral bruits Neurological: grossly nonfocal Psych: Normal mood and affect   Wt Readings from Last 3 Encounters:  01/30/18 269 lb 6.4 oz (122.2 kg)  02/28/17 263 lb (119.3 kg)  04/11/16 260 lb (117.9 kg)      Studies/Labs Reviewed:   EKG:  EKG is ordered today.  It shows AV sequential pacing, QTC 468 ms Recent Labs: November 15, 2017 Dr. Buddy Duty Total cholesterol 208, LDL 96, HDL 39, triglycerides 36 Creatinine 2.07, hemoglobin A1c 8%, TSH 2.83, hemoglobin 16.1  Lipid Panel    Component Value Date/Time   CHOL 322 (H) 04/01/2015 0927   TRIG 501 (H) 04/01/2015 0927   HDL 26 (L) 04/01/2015 0927   CHOLHDL 12.4 (H) 04/01/2015 0927   VLDL NOT CALC 04/01/2015 0927   LDLCALC NOT CALC 04/01/2015 0927   LDLDIRECT 138 (H) 04/01/2015 0927   More recent lipid profile from October 4 shows total cholesterol 197, triglycerides 305, HDL 35, LDL 89 BUN 33, creatinine 1.83, hemoglobin A1c 7.6%, hemoglobin 16.2 normal liver function tests  ASSESSMENT:    1. Chronic combined systolic and diastolic heart failure  ([place])   2. SSS (sick sinus syndrome) ([place])   3. Second degree AV block   4. Pacemaker   5.  Coronary artery disease involving coronary bypass graft of native heart without angina pectoris   6. Diabetes mellitus type 2 in obese ([place])   7. Essential hypertension   8. Morbid obesity ([place])   9. Hypercholesterolemia   10. CKD (chronic kidney disease) stage 3, GFR 30-59 ml/min ([facility])   11. OSA (obstructive sleep apnea)      PLAN:  In order of problems listed above:  1. CHF: Obesity seriously limits the physical exam, but I believe he is euvolemic, NYHA functional class II.  Watch for worsening heart failure with increased burden of ventricular pacing.  Avoid increases in metoprolol or other negative chronotropic agents.  2. SSS: Heart rate histogram suggests satisfactory sensor settings, a little blunted but appropriate for his activity level 3. 2nd deg AVB: If he develops worsening heart failure we will repeat an echocardiogram and assess suitability for CRT-P upgrade 4. PPM: Remote downloads every 3 months, yearly office visits 5. CAD: s/p DES stenting proximal LAD stenosis and DX2 POBA, June, 2005 and staged RCA DES, August, 2005. Free of angina. Last functional study was in 2014.  He does not have angina pectoris.  Avoid contrast-based procedures due to advancing kidney disease. 6. DM: On insulin, followed by Dr. Buddy Duty.  On ARB for renal protection.  Last hemoglobin A1c 8% was improvement but still not at target 7. HTN: Slightly elevated systolic blood pressure today, but he monitors it regularly at home and it is consistently in the 120-130 range.  No changes made to his blood pressure medications. 8. Morbid obesity: This puts him at a big disadvantage for both his metabolic and hemodynamic problems. 9. HLP: On maximum dose rosuvastatin which is of some concern with his kidney disease.  Despite this his LDL is not at target.  Discussed PCSK9 inhibitor but he finds the cost prohibitive. 10. CKD  stage 3-4: Likely primarily due to diabetic nephropathy. Baseline creatinine seems to be around 1.8. Repeat labs after we have adjusted his diuretic and switched him to a different ARB. 11. OSA: Intolerant of CPAP.  No success with weight loss.    Medication Adjustments/Labs and Tests Ordered: Current medicines are reviewed at length with the patient today.  Concerns regarding medicines are outlined above.  Medication changes, Labs and Tests ordered today are listed in the Patient Instructions below. Patient Instructions  Dr Sallyanne Kuster recommends that you continue on your current medications as directed. Please refer to the Current Medication list given to you today.  Remote monitoring is used to monitor your Pacemaker or ICD from home. This monitoring reduces the number of office visits required to check your device to one time per year. It allows [place] to keep an eye on the functioning of your device to ensure it is working properly. You are scheduled for a device check from home on Monday, October 7th, 2019. You may send your transmission at any time that day. If you have a wireless device, the transmission will be sent automatically. After your physician reviews your transmission, you will receive a postcard with your next transmission date.  To improve our patient care and to more adequately follow your device, [facility] has decided, as a practice, to start following each patient four times a year with your home monitor. This means that you may experience a remote appointment that is close to an in-office appointment with your physician. Your insurance will apply at the same rate as other remote monitoring transmissions.  Dr Sallyanne Kuster recommends that you schedule a  follow-up appointment in 6 months with a pacemaker check. You will receive a reminder letter in the mail two months in advance. If you don't receive a letter, please call our office to schedule the follow-up appointment.  If you need  a refill on your cardiac medications before your next appointment, please call your pharmacy.    Signed, William Klein, MD  01/31/2018 5:08 PM    [facility] [place], [place], [place]  [address] Phone: [phone]; Fax: [phone]

## 2018-02-06 ENCOUNTER — Ambulatory Visit (INDEPENDENT_AMBULATORY_CARE_PROVIDER_SITE_OTHER): Payer: Medicare Other | Admitting: Podiatry

## 2018-02-06 ENCOUNTER — Encounter: Payer: Self-pay | Admitting: Podiatry

## 2018-02-06 DIAGNOSIS — B351 Tinea unguium: Secondary | ICD-10-CM

## 2018-02-06 DIAGNOSIS — E1140 Type 2 diabetes mellitus with diabetic neuropathy, unspecified: Secondary | ICD-10-CM | POA: Diagnosis not present

## 2018-02-06 DIAGNOSIS — Z794 Long term (current) use of insulin: Secondary | ICD-10-CM

## 2018-02-06 DIAGNOSIS — D689 Coagulation defect, unspecified: Secondary | ICD-10-CM

## 2018-02-06 NOTE — Progress Notes (Signed)
Complaint:  Visit Type: Patient returns to my office for continued preventative foot care services. Complaint: Patient states" my nails have grown long and thick and become painful to walk and wear shoes" Patient has been diagnosed with DM with no foot complications. The patient presents for preventative foot care services. No changes to ROS.  Patient is taking plavix.  Podiatric Exam: Vascular: dorsalis pedis and posterior tibial pulses are palpable bilateral. Capillary return is immediate. Temperature gradient is WNL. Skin turgor WNL  Sensorium: Normal Semmes Weinstein monofilament test. Normal tactile sensation bilaterally. Nail Exam: Pt has thick disfigured discolored nails with subungual debris noted bilateral entire nail hallux through fifth toenails Ulcer Exam: There is no evidence of ulcer or pre-ulcerative changes or infection. Orthopedic Exam: Muscle tone and strength are WNL. No limitations in general ROM. No crepitus or effusions noted. Foot type and digits show no abnormalities. Bony prominences are unremarkable. Skin: No Porokeratosis. No infection or ulcers  Diagnosis:  Onychomycosis, , Pain in right toe, pain in left toes  Treatment & Plan Procedures and Treatment: Consent by patient was obtained for treatment procedures. The patient understood the discussion of treatment and procedures well. All questions were answered thoroughly reviewed. Debridement of mycotic and hypertrophic toenails, 1 through 5 bilateral and clearing of subungual debris. No ulceration, no infection noted. .ABN signed for 2019.  Return Visit-Office Procedure: Patient instructed to return to the office for a follow up visit 3 months for continued evaluation and treatment.    [name] DPM

## 2018-02-25 ENCOUNTER — Ambulatory Visit (INDEPENDENT_AMBULATORY_CARE_PROVIDER_SITE_OTHER): Payer: Medicare Other | Admitting: *Deleted

## 2018-02-25 DIAGNOSIS — I495 Sick sinus syndrome: Secondary | ICD-10-CM

## 2018-02-25 NOTE — Progress Notes (Signed)
Remote pacemaker transmission.   

## 2018-03-06 ENCOUNTER — Encounter: Payer: Self-pay | Admitting: Cardiology

## 2018-03-10 ENCOUNTER — Other Ambulatory Visit: Payer: Self-pay | Admitting: Cardiovascular Disease

## 2018-03-23 DIAGNOSIS — Z23 Encounter for immunization: Secondary | ICD-10-CM | POA: Diagnosis not present

## 2018-03-25 ENCOUNTER — Other Ambulatory Visit: Payer: Self-pay | Admitting: Cardiovascular Disease

## 2018-03-25 NOTE — Telephone Encounter (Signed)
Rx(s) sent to pharmacy electronically.  

## 2018-04-02 ENCOUNTER — Other Ambulatory Visit: Payer: Self-pay | Admitting: Cardiovascular Disease

## 2018-04-02 NOTE — Telephone Encounter (Signed)
Rx request sent to pharmacy.  

## 2018-04-08 DIAGNOSIS — H903 Sensorineural hearing loss, bilateral: Secondary | ICD-10-CM

## 2018-04-08 DIAGNOSIS — H9313 Tinnitus, bilateral: Secondary | ICD-10-CM | POA: Diagnosis not present

## 2018-04-08 HISTORY — DX: Tinnitus, bilateral: H93.13

## 2018-04-08 HISTORY — DX: Sensorineural hearing loss, bilateral: H90.3

## 2018-04-10 ENCOUNTER — Other Ambulatory Visit: Payer: Self-pay | Admitting: Cardiovascular Disease

## 2018-04-12 LAB — CUP PACEART REMOTE DEVICE CHECK
Battery Remaining Longevity: 22 mo
Battery Remaining Percentage: 19 %
Battery Voltage: 2.77 V
Brady Statistic AP VP Percent: 52 %
Brady Statistic AP VS Percent: 1 %
Brady Statistic AS VP Percent: 47 %
Brady Statistic AS VS Percent: 1 %
Brady Statistic RA Percent Paced: 52 %
Brady Statistic RV Percent Paced: 99 %
Date Time Interrogation Session: 20191007081341
Implantable Lead Implant Date: 19901207
Implantable Lead Implant Date: 20111110
Implantable Lead Location: 753862
Implantable Lead Location: 753862
Implantable Pulse Generator Implant Date: 20111110
Lead Channel Impedance Value: 280 Ohm
Lead Channel Impedance Value: 540 Ohm
Lead Channel Pacing Threshold Amplitude: 0.875 V
Lead Channel Pacing Threshold Amplitude: 1.125 V
Lead Channel Pacing Threshold Pulse Width: 0.4 ms
Lead Channel Pacing Threshold Pulse Width: 0.6 ms
Lead Channel Sensing Intrinsic Amplitude: 12 mV
Lead Channel Sensing Intrinsic Amplitude: 4.3 mV
Lead Channel Setting Pacing Amplitude: 1.375
Lead Channel Setting Pacing Amplitude: 1.875
Lead Channel Setting Pacing Pulse Width: 0.6 ms
Lead Channel Setting Sensing Sensitivity: 3 mV
Pulse Gen Model: 2210
Pulse Gen Serial Number: 7174903

## 2018-05-03 DIAGNOSIS — D2272 Melanocytic nevi of left lower limb, including hip: Secondary | ICD-10-CM | POA: Diagnosis not present

## 2018-05-03 DIAGNOSIS — D2262 Melanocytic nevi of left upper limb, including shoulder: Secondary | ICD-10-CM | POA: Diagnosis not present

## 2018-05-03 DIAGNOSIS — L438 Other lichen planus: Secondary | ICD-10-CM | POA: Diagnosis not present

## 2018-05-03 DIAGNOSIS — D225 Melanocytic nevi of trunk: Secondary | ICD-10-CM | POA: Diagnosis not present

## 2018-05-03 DIAGNOSIS — L570 Actinic keratosis: Secondary | ICD-10-CM | POA: Diagnosis not present

## 2018-05-03 DIAGNOSIS — Z85828 Personal history of other malignant neoplasm of skin: Secondary | ICD-10-CM | POA: Diagnosis not present

## 2018-05-03 DIAGNOSIS — D1801 Hemangioma of skin and subcutaneous tissue: Secondary | ICD-10-CM | POA: Diagnosis not present

## 2018-05-03 DIAGNOSIS — D2261 Melanocytic nevi of right upper limb, including shoulder: Secondary | ICD-10-CM | POA: Diagnosis not present

## 2018-05-03 DIAGNOSIS — D2271 Melanocytic nevi of right lower limb, including hip: Secondary | ICD-10-CM | POA: Diagnosis not present

## 2018-05-03 DIAGNOSIS — L720 Epidermal cyst: Secondary | ICD-10-CM | POA: Diagnosis not present

## 2018-05-03 DIAGNOSIS — L821 Other seborrheic keratosis: Secondary | ICD-10-CM | POA: Diagnosis not present

## 2018-05-07 DIAGNOSIS — E1142 Type 2 diabetes mellitus with diabetic polyneuropathy: Secondary | ICD-10-CM | POA: Diagnosis not present

## 2018-05-07 DIAGNOSIS — Z6841 Body Mass Index (BMI) 40.0 and over, adult: Secondary | ICD-10-CM | POA: Diagnosis not present

## 2018-05-07 DIAGNOSIS — N183 Chronic kidney disease, stage 3 (moderate): Secondary | ICD-10-CM | POA: Diagnosis not present

## 2018-05-07 DIAGNOSIS — Z794 Long term (current) use of insulin: Secondary | ICD-10-CM | POA: Diagnosis not present

## 2018-05-07 DIAGNOSIS — E039 Hypothyroidism, unspecified: Secondary | ICD-10-CM | POA: Diagnosis not present

## 2018-05-08 ENCOUNTER — Encounter: Payer: Self-pay | Admitting: Podiatry

## 2018-05-08 ENCOUNTER — Ambulatory Visit (INDEPENDENT_AMBULATORY_CARE_PROVIDER_SITE_OTHER): Payer: Medicare Other | Admitting: Podiatry

## 2018-05-08 DIAGNOSIS — D689 Coagulation defect, unspecified: Secondary | ICD-10-CM

## 2018-05-08 DIAGNOSIS — B351 Tinea unguium: Secondary | ICD-10-CM | POA: Diagnosis not present

## 2018-05-08 DIAGNOSIS — Z794 Long term (current) use of insulin: Secondary | ICD-10-CM

## 2018-05-08 DIAGNOSIS — E1140 Type 2 diabetes mellitus with diabetic neuropathy, unspecified: Secondary | ICD-10-CM

## 2018-05-08 NOTE — Progress Notes (Signed)
Complaint:  Visit Type: Patient returns to my office for continued preventative foot care services. Complaint: Patient states" my nails have grown long and thick and become painful to walk and wear shoes" Patient has been diagnosed with DM with no foot complications. The patient presents for preventative foot care services. No changes to ROS.  Patient is taking plavix.  Podiatric Exam: Vascular: dorsalis pedis and posterior tibial pulses are palpable bilateral. Capillary return is immediate. Temperature gradient is WNL. Skin turgor WNL  Sensorium: Normal Semmes Weinstein monofilament test. Normal tactile sensation bilaterally. Nail Exam: Pt has thick disfigured discolored nails with subungual debris noted bilateral entire nail hallux through fifth toenails Ulcer Exam: There is no evidence of ulcer or pre-ulcerative changes or infection. Orthopedic Exam: Muscle tone and strength are WNL. No limitations in general ROM. No crepitus or effusions noted. Foot type and digits show no abnormalities. Bony prominences are unremarkable. Skin: No Porokeratosis. No infection or ulcers  Diagnosis:  Onychomycosis, , Pain in right toe, pain in left toes  Treatment & Plan Procedures and Treatment: Consent by patient was obtained for treatment procedures. The patient understood the discussion of treatment and procedures well. All questions were answered thoroughly reviewed. Debridement of mycotic and hypertrophic toenails, 1 through 5 bilateral and clearing of subungual debris. No ulceration, no infection noted. .ABN signed for 2019.  Return Visit-Office Procedure: Patient instructed to return to the office for a follow up visit 3 months for continued evaluation and treatment.    [name] DPM

## 2018-05-10 DIAGNOSIS — E039 Hypothyroidism, unspecified: Secondary | ICD-10-CM | POA: Diagnosis not present

## 2018-05-10 DIAGNOSIS — Z794 Long term (current) use of insulin: Secondary | ICD-10-CM | POA: Diagnosis not present

## 2018-05-10 DIAGNOSIS — E1165 Type 2 diabetes mellitus with hyperglycemia: Secondary | ICD-10-CM | POA: Diagnosis not present

## 2018-05-10 DIAGNOSIS — E782 Mixed hyperlipidemia: Secondary | ICD-10-CM | POA: Diagnosis not present

## 2018-05-10 DIAGNOSIS — E1142 Type 2 diabetes mellitus with diabetic polyneuropathy: Secondary | ICD-10-CM | POA: Diagnosis not present

## 2018-05-13 ENCOUNTER — Other Ambulatory Visit: Payer: Self-pay | Admitting: *Deleted

## 2018-05-13 MED ORDER — METOPROLOL SUCCINATE ER 25 MG PO TB24: 37.5000 mg | ORAL_TABLET | Freq: Every day | ORAL | 1 refills | Status: DC

## 2018-05-27 ENCOUNTER — Ambulatory Visit (INDEPENDENT_AMBULATORY_CARE_PROVIDER_SITE_OTHER): Payer: Medicare Other

## 2018-05-27 DIAGNOSIS — I495 Sick sinus syndrome: Secondary | ICD-10-CM

## 2018-05-28 LAB — CUP PACEART REMOTE DEVICE CHECK
Battery Remaining Longevity: 17 mo
Battery Remaining Percentage: 15 %
Battery Voltage: 2.74 V
Brady Statistic AP VP Percent: 46 %
Brady Statistic AP VS Percent: 1 %
Brady Statistic AS VP Percent: 54 %
Brady Statistic AS VS Percent: 1 %
Brady Statistic RA Percent Paced: 46 %
Brady Statistic RV Percent Paced: 99 %
Date Time Interrogation Session: 20200106084320
Implantable Lead Implant Date: 19901207
Implantable Lead Implant Date: 20111110
Implantable Lead Location: 753862
Implantable Lead Location: 753862
Implantable Pulse Generator Implant Date: 20111110
Lead Channel Impedance Value: 280 Ohm
Lead Channel Impedance Value: 540 Ohm
Lead Channel Pacing Threshold Amplitude: 0.875 V
Lead Channel Pacing Threshold Amplitude: 1 V
Lead Channel Pacing Threshold Pulse Width: 0.4 ms
Lead Channel Pacing Threshold Pulse Width: 0.6 ms
Lead Channel Sensing Intrinsic Amplitude: 12 mV
Lead Channel Sensing Intrinsic Amplitude: 4.7 mV
Lead Channel Setting Pacing Amplitude: 1.25 V
Lead Channel Setting Pacing Amplitude: 1.875
Lead Channel Setting Pacing Pulse Width: 0.6 ms
Lead Channel Setting Sensing Sensitivity: 3 mV
Pulse Gen Model: 2210
Pulse Gen Serial Number: 7174903

## 2018-05-28 NOTE — Progress Notes (Signed)
Remote pacemaker transmission.   

## 2018-06-12 DIAGNOSIS — Z794 Long term (current) use of insulin: Secondary | ICD-10-CM | POA: Diagnosis not present

## 2018-06-12 DIAGNOSIS — E119 Type 2 diabetes mellitus without complications: Secondary | ICD-10-CM | POA: Diagnosis not present

## 2018-06-12 DIAGNOSIS — Z961 Presence of intraocular lens: Secondary | ICD-10-CM | POA: Diagnosis not present

## 2018-06-12 DIAGNOSIS — H5201 Hypermetropia, right eye: Secondary | ICD-10-CM | POA: Diagnosis not present

## 2018-06-12 DIAGNOSIS — H52222 Regular astigmatism, left eye: Secondary | ICD-10-CM | POA: Diagnosis not present

## 2018-06-12 DIAGNOSIS — H524 Presbyopia: Secondary | ICD-10-CM | POA: Diagnosis not present

## 2018-06-12 DIAGNOSIS — Z7984 Long term (current) use of oral hypoglycemic drugs: Secondary | ICD-10-CM | POA: Diagnosis not present

## 2018-06-28 DIAGNOSIS — Z950 Presence of cardiac pacemaker: Secondary | ICD-10-CM | POA: Diagnosis not present

## 2018-06-28 DIAGNOSIS — M5136 Other intervertebral disc degeneration, lumbar region: Secondary | ICD-10-CM | POA: Diagnosis not present

## 2018-06-28 DIAGNOSIS — I5042 Chronic combined systolic (congestive) and diastolic (congestive) heart failure: Secondary | ICD-10-CM | POA: Diagnosis not present

## 2018-06-28 DIAGNOSIS — Z0000 Encounter for general adult medical examination without abnormal findings: Secondary | ICD-10-CM | POA: Diagnosis not present

## 2018-06-28 DIAGNOSIS — E1142 Type 2 diabetes mellitus with diabetic polyneuropathy: Secondary | ICD-10-CM | POA: Diagnosis not present

## 2018-06-28 DIAGNOSIS — E039 Hypothyroidism, unspecified: Secondary | ICD-10-CM | POA: Diagnosis not present

## 2018-06-28 DIAGNOSIS — I495 Sick sinus syndrome: Secondary | ICD-10-CM | POA: Diagnosis not present

## 2018-06-28 DIAGNOSIS — N183 Chronic kidney disease, stage 3 (moderate): Secondary | ICD-10-CM | POA: Diagnosis not present

## 2018-06-28 DIAGNOSIS — Z8546 Personal history of malignant neoplasm of prostate: Secondary | ICD-10-CM | POA: Diagnosis not present

## 2018-06-28 DIAGNOSIS — G4733 Obstructive sleep apnea (adult) (pediatric): Secondary | ICD-10-CM | POA: Diagnosis not present

## 2018-06-28 DIAGNOSIS — I2510 Atherosclerotic heart disease of native coronary artery without angina pectoris: Secondary | ICD-10-CM | POA: Diagnosis not present

## 2018-08-07 ENCOUNTER — Other Ambulatory Visit: Payer: Self-pay

## 2018-08-07 ENCOUNTER — Encounter: Payer: Self-pay | Admitting: Podiatry

## 2018-08-07 ENCOUNTER — Ambulatory Visit (INDEPENDENT_AMBULATORY_CARE_PROVIDER_SITE_OTHER): Payer: Medicare Other | Admitting: Podiatry

## 2018-08-07 DIAGNOSIS — M79676 Pain in unspecified toe(s): Secondary | ICD-10-CM

## 2018-08-07 DIAGNOSIS — Z794 Long term (current) use of insulin: Secondary | ICD-10-CM

## 2018-08-07 DIAGNOSIS — E1140 Type 2 diabetes mellitus with diabetic neuropathy, unspecified: Secondary | ICD-10-CM

## 2018-08-07 DIAGNOSIS — B351 Tinea unguium: Secondary | ICD-10-CM | POA: Diagnosis not present

## 2018-08-07 DIAGNOSIS — D689 Coagulation defect, unspecified: Secondary | ICD-10-CM

## 2018-08-07 NOTE — Progress Notes (Signed)
Complaint:  Visit Type: Patient returns to my office for continued preventative foot care services. Complaint: Patient states" my nails have grown long and thick and become painful to walk and wear shoes" Patient has been diagnosed with DM with no foot complications. The patient presents for preventative foot care services. No changes to ROS.  Patient is taking plavix.  Podiatric Exam: Vascular: dorsalis pedis and posterior tibial pulses are palpable bilateral. Capillary return is immediate. Temperature gradient is WNL. Skin turgor WNL  Sensorium: Normal Semmes Weinstein monofilament test. Normal tactile sensation bilaterally. Nail Exam: Pt has thick disfigured discolored nails with subungual debris noted bilateral entire nail hallux through fifth toenails Ulcer Exam: There is no evidence of ulcer or pre-ulcerative changes or infection. Orthopedic Exam: Muscle tone and strength are WNL. No limitations in general ROM. No crepitus or effusions noted. Foot type and digits show no abnormalities. Bony prominences are unremarkable. Skin: No Porokeratosis. No infection or ulcers  Diagnosis:  Onychomycosis, , Pain in right toe, pain in left toes  Treatment & Plan Procedures and Treatment: Consent by patient was obtained for treatment procedures. The patient understood the discussion of treatment and procedures well. All questions were answered thoroughly reviewed. Debridement of mycotic and hypertrophic toenails, 1 through 5 bilateral and clearing of subungual debris. No ulceration, no infection noted. .ABN signed for 2019.  Return Visit-Office Procedure: Patient instructed to return to the office for a follow up visit 3 months for continued evaluation and treatment.    [name] DPM

## 2018-08-26 ENCOUNTER — Other Ambulatory Visit: Payer: Self-pay

## 2018-08-26 ENCOUNTER — Ambulatory Visit (INDEPENDENT_AMBULATORY_CARE_PROVIDER_SITE_OTHER): Payer: Medicare Other | Admitting: *Deleted

## 2018-08-26 DIAGNOSIS — I495 Sick sinus syndrome: Secondary | ICD-10-CM | POA: Diagnosis not present

## 2018-08-26 LAB — CUP PACEART REMOTE DEVICE CHECK
Battery Remaining Longevity: 10 mo
Battery Remaining Percentage: 8 %
Battery Voltage: 2.69 V
Brady Statistic AP VP Percent: 55 %
Brady Statistic AP VS Percent: 1 %
Brady Statistic AS VP Percent: 45 %
Brady Statistic AS VS Percent: 1 %
Brady Statistic RA Percent Paced: 55 %
Brady Statistic RV Percent Paced: 99 %
Date Time Interrogation Session: 20200406092013
Implantable Lead Implant Date: 19901207
Implantable Lead Implant Date: 20111110
Implantable Lead Location: 753862
Implantable Lead Location: 753862
Implantable Pulse Generator Implant Date: 20111110
Lead Channel Impedance Value: 280 Ohm
Lead Channel Impedance Value: 540 Ohm
Lead Channel Pacing Threshold Amplitude: 0.875 V
Lead Channel Pacing Threshold Amplitude: 1 V
Lead Channel Pacing Threshold Pulse Width: 0.4 ms
Lead Channel Pacing Threshold Pulse Width: 0.6 ms
Lead Channel Sensing Intrinsic Amplitude: 12 mV
Lead Channel Sensing Intrinsic Amplitude: 4.5 mV
Lead Channel Setting Pacing Amplitude: 1.25 V
Lead Channel Setting Pacing Amplitude: 1.875
Lead Channel Setting Pacing Pulse Width: 0.6 ms
Lead Channel Setting Sensing Sensitivity: 3 mV
Pulse Gen Model: 2210
Pulse Gen Serial Number: 7174903

## 2018-09-03 ENCOUNTER — Encounter: Payer: Self-pay | Admitting: Cardiology

## 2018-09-03 DIAGNOSIS — N183 Chronic kidney disease, stage 3 (moderate): Secondary | ICD-10-CM | POA: Diagnosis not present

## 2018-09-03 DIAGNOSIS — E039 Hypothyroidism, unspecified: Secondary | ICD-10-CM | POA: Diagnosis not present

## 2018-09-03 DIAGNOSIS — E1142 Type 2 diabetes mellitus with diabetic polyneuropathy: Secondary | ICD-10-CM | POA: Diagnosis not present

## 2018-09-03 DIAGNOSIS — Z6841 Body Mass Index (BMI) 40.0 and over, adult: Secondary | ICD-10-CM | POA: Diagnosis not present

## 2018-09-03 DIAGNOSIS — Z794 Long term (current) use of insulin: Secondary | ICD-10-CM | POA: Diagnosis not present

## 2018-09-03 NOTE — Progress Notes (Signed)
Remote pacemaker transmission.   

## 2018-09-09 ENCOUNTER — Telehealth: Payer: Self-pay

## 2018-09-09 NOTE — Telephone Encounter (Signed)
Virtual Visit Pre-Appointment Phone Call  Steps For Call:  1. Confirm consent - "In the setting of the current Covid19 crisis, you are scheduled for a VIDEO visit with Dr. Sallyanne Kuster on 09/12/2018 at 11:00AM.  Just as we do with many in-office visits, in order for you to participate in this visit, we must obtain consent.  If you'd like, I can send this to your mychart (if signed up) or email for you to review.  Otherwise, I can obtain your verbal consent now.  All virtual visits are billed to your insurance company just like a normal visit would be.  By agreeing to a virtual visit, we'd like you to understand that the technology does not allow for your provider to perform an examination, and thus may limit your provider's ability to fully assess your condition. If your provider identifies any concerns that need to be evaluated in person, we will make arrangements to do so.  Finally, though the technology is pretty good, we cannot assure that it will always work on either your or our end, and in the setting of a video visit, we may have to convert it to a phone-only visit.  In either situation, we cannot ensure that we have a secure connection.  Are you willing to proceed?" STAFF: Did the patient verbally acknowledge consent to telehealth visit? Document YES/NO here: YES  2. Confirm the BEST phone number to call the day of the visit by including in appointment notes  3. Give patient instructions for WebEx/MyChart download to smartphone as below or Doximity/Doxy.me if video visit (depending on what platform provider is using)  4. Advise patient to be prepared with their blood pressure, heart rate, weight, any heart rhythm information, their current medicines, and a piece of paper and pen handy for any instructions they may receive the day of their visit  5. Inform patient they will receive a phone call 15 minutes prior to their appointment time (may be from unknown caller ID) so they should be prepared  to answer  6. Confirm that appointment type is correct in Epic appointment notes (VIDEO vs PHONE)     TELEPHONE CALL NOTE  [name] has been deemed a candidate for a follow-up tele-health visit to limit community exposure during the Covid-19 pandemic. I spoke with the patient via phone to ensure availability of phone/video source, confirm preferred email & phone number, and discuss instructions and expectations.  I reminded [name] to be prepared with any vital sign and/or heart rhythm information that could potentially be obtained via home monitoring, at the time of his visit. I reminded [name] to expect a phone call at the time of his visit if his visit.  Jacqulynn Cadet, CMA 09/09/2018 1:25 PM   INSTRUCTIONS FOR DOWNLOADING THE [name] TO SMARTPHONE  - If Apple, ask patient to go to [employer] and type in WebEx in the search bar. [name], the blue/green circle. If Android, go to [employer] and type in [employer] in the search bar. The app is free but as with any other app downloads, their phone may require them to verify saved payment information or Apple/Android password.  - The patient does NOT have to create an account. - On the day of the visit, the assist will walk the patient through joining the meeting with the meeting number/password.  INSTRUCTIONS FOR DOWNLOADING THE MYCHART APP TO SMARTPHONE  - The patient must first make sure to have activated MyChart  and know their login information - If Apple, go to [employer] and type in MyChart in the search bar and download the app. If Android, ask patient to go to [employer] and type in [place] in the search bar and download the app. The app is free but as with any other app downloads, their phone may require them to verify saved payment information or Apple/Android password.  - The patient will need to then log into the app with their MyChart username and password, and select  as  their healthcare provider to link the account. When it is time for your visit, go to the MyChart app, find appointments, and click Begin Video Visit. Be sure to Select Allow for your device to access the Microphone and Camera for your visit. You will then be connected, and your provider will be with you shortly.  **If they have any issues connecting, or need assistance please contact MyChart service desk [phone])**  **If using a computer, in order to ensure the best quality for their visit they will need to use either of the following Internet Browsers: [employer], or Google Chrome**  IF USING DOXIMITY or DOXY.ME - The patient will receive a link just prior to their visit, either by text or email (to be determined day of appointment depending on if it's doxy.me or Doximity).     FULL LENGTH CONSENT FOR TELE-HEALTH VISIT   I hereby voluntarily request, consent and authorize [facility] and its employed or contracted physicians, physician assistants, nurse practitioners or other licensed health care professionals (the Practitioner), to provide me with telemedicine health care services (the "Services") as deemed necessary by the treating Practitioner. I acknowledge and consent to receive the Services by the Practitioner via telemedicine. I understand that the telemedicine visit will involve communicating with the Practitioner through live audiovisual communication technology and the disclosure of certain medical information by electronic transmission. I acknowledge that I have been given the opportunity to request an in-person assessment or other available alternative prior to the telemedicine visit and am voluntarily participating in the telemedicine visit.  I understand that I have the right to withhold or withdraw my consent to the use of telemedicine in the course of my care at any time, without affecting my right to future care or treatment, and that the Practitioner or I  may terminate the telemedicine visit at any time. I understand that I have the right to inspect all information obtained and/or recorded in the course of the telemedicine visit and may receive copies of available information for a reasonable fee.  I understand that some of the potential risks of receiving the Services via telemedicine include:  Marland Kitchen Delay or interruption in medical evaluation due to technological equipment failure or disruption; . Information transmitted may not be sufficient (e.g. poor resolution of images) to allow for appropriate medical decision making by the Practitioner; and/or  . In rare instances, security protocols could fail, causing a breach of personal health information.  Furthermore, I acknowledge that it is my responsibility to provide information about my medical history, conditions and care that is complete and accurate to the best of my ability. I acknowledge that Practitioner's advice, recommendations, and/or decision may be based on factors not within their control, such as incomplete or inaccurate data provided by me or distortions of diagnostic images or specimens that may result from electronic transmissions. I understand that the practice of medicine is not an exact science and that Practitioner makes  no warranties or guarantees regarding treatment outcomes. I acknowledge that I will receive a copy of this consent concurrently upon execution via email to the email address I last provided but may also request a printed copy by calling the office of [facility].    I understand that my insurance will be billed for this visit.   I have read or had this consent read to me. . I understand the contents of this consent, which adequately explains the benefits and risks of the Services being provided via telemedicine.  . I have been provided ample opportunity to ask questions regarding this consent and the Services and have had my questions answered to my satisfaction. . I  give my informed consent for the services to be provided through the use of telemedicine in my medical care  By participating in this telemedicine visit I agree to the above.

## 2018-09-09 NOTE — Telephone Encounter (Signed)
Please reassure him that I can get the info I need from his remote download and a conversation with him (preferably a video one if he has a smartphone) and we will keep his appt as a virtual visit MCr

## 2018-09-09 NOTE — Telephone Encounter (Signed)
Patient asked how is he to do his pacer check since he is now doing a virtual visit. I informed patient that I will check with Dr. Sallyanne Kuster and get back with him

## 2018-09-10 NOTE — Telephone Encounter (Signed)
Left a voice message for the patient to call me back

## 2018-09-11 ENCOUNTER — Telehealth: Payer: Self-pay | Admitting: Cardiovascular Disease

## 2018-09-11 NOTE — Telephone Encounter (Signed)
LVM/Called x3 for pre reg

## 2018-09-11 NOTE — Telephone Encounter (Signed)
LVM for pre reg °

## 2018-09-12 ENCOUNTER — Telehealth (INDEPENDENT_AMBULATORY_CARE_PROVIDER_SITE_OTHER): Payer: Medicare Other | Admitting: Cardiovascular Disease

## 2018-09-12 ENCOUNTER — Encounter: Payer: Medicare Other | Admitting: Cardiovascular Disease

## 2018-09-12 ENCOUNTER — Ambulatory Visit: Payer: Medicare Other | Admitting: Cardiovascular Disease

## 2018-09-12 DIAGNOSIS — I495 Sick sinus syndrome: Secondary | ICD-10-CM | POA: Diagnosis not present

## 2018-09-12 DIAGNOSIS — I5042 Chronic combined systolic (congestive) and diastolic (congestive) heart failure: Secondary | ICD-10-CM

## 2018-09-12 DIAGNOSIS — N183 Chronic kidney disease, stage 3 unspecified: Secondary | ICD-10-CM

## 2018-09-12 DIAGNOSIS — I441 Atrioventricular block, second degree: Secondary | ICD-10-CM

## 2018-09-12 DIAGNOSIS — Z794 Long term (current) use of insulin: Secondary | ICD-10-CM | POA: Diagnosis not present

## 2018-09-12 DIAGNOSIS — E1122 Type 2 diabetes mellitus with diabetic chronic kidney disease: Secondary | ICD-10-CM

## 2018-09-12 DIAGNOSIS — E782 Mixed hyperlipidemia: Secondary | ICD-10-CM

## 2018-09-12 DIAGNOSIS — G4733 Obstructive sleep apnea (adult) (pediatric): Secondary | ICD-10-CM | POA: Diagnosis not present

## 2018-09-12 DIAGNOSIS — Z950 Presence of cardiac pacemaker: Secondary | ICD-10-CM

## 2018-09-12 DIAGNOSIS — I2510 Atherosclerotic heart disease of native coronary artery without angina pectoris: Secondary | ICD-10-CM | POA: Diagnosis not present

## 2018-09-12 NOTE — Patient Instructions (Signed)
Medication Instructions:  Your physician recommends that you continue on your current medications as directed. Please refer to the Current Medication list given to you today.  If you need a refill on your cardiac medications before your next appointment, please call your pharmacy.   Follow-Up: At [facility], you and your health needs are our priority.  As part of our continuing mission to provide you with exceptional heart care, we have created designated Provider Care Teams.  These Care Teams include your primary Cardiologist (physician) and Advanced Practice Providers (APPs -  Physician Assistants and Nurse Practitioners) who all work together to provide you with the care you need, when you need it. You will need a follow up appointment in 9 months.  Please call our office 2 months in advance to schedule this appointment.  You may see Sanda Klein, MD or one of the following Advanced Practice Providers on your designated Care Team: [place], [place] . Fabian Sharp, PA-C  Any Other Special Instructions Will Be Listed Below (If Applicable). None

## 2018-09-12 NOTE — Progress Notes (Addendum)
Virtual Visit via Video Note   This visit type was conducted due to national recommendations for restrictions regarding the COVID-19 Pandemic (e.g. social distancing) in an effort to limit this patient's exposure and mitigate transmission in our community.  Due to his co-morbid illnesses, this patient is at least at moderate risk for complications without adequate follow up.  This format is felt to be most appropriate for this patient at this time.  All issues noted in this document were discussed and addressed.  A limited physical exam was performed with this format.  Please refer to the patient's chart for his consent to telehealth for [facility].   Evaluation Performed:  Follow-up visit  Date:  09/12/2018   ID:  [name], DOB 13-Apr-1932, MRN [id number]  Patient Location: Home Provider Location: Home  PCP:  [name], MD  Cardiologist:  Vicci Reder Electrophysiologist:  None   Chief Complaint:  CHF, SSS/2nd deg AV block, PM   History of Present Illness:    [name] is a 85 y.o. male with tachycardia-bradycardia syndrome (sinus node dysfunction, second-degree AV block, episodes of paroxysmal atrial tachycardia), status post dual-chamber permanent pacemaker implantation (initial implant 1990, last generator change St. Jude Accent DR RF 2011), congestive heart failure with combined systolic and diastolic dysfunction (mildly depressed LVEF 45 to 50% by last echo in 2014), coronary artery disease with remote percutaneous revascularization (no PCI since 2005), obesity with type 2 diabetes mellitus and mixed hyperlipidemia, chronic kidney disease stage III, systemic hypertension, obstructive sleep apnea intolerant to CPAP.  He has not had any recent major cardiac complaints.  He never experiences angina pectoris.  He has mild dyspnea on exertion, functional class II which is unchanged.  He developed some edema towards the end of the day that resolves after overnight supine  position.  He has been suffering a little bit with allergies and has a bit of a scratchy throat, but has been afebrile and does not have a cough.  Both he and his wife have been avoiding contact with the outside world since the coronavirus lockdown.  Sugar control is good and he has occasional diaphoresis from hypoglycemia (he becomes symptomatic when his blood sugar falls to 80 or less).  He has not experienced syncope.  He has been quite sedentary recently. He has elected for abrupt buildings, but has been limited to photographing blooming flowers in his backyard.  His most recent creatinine was 2.29, corresponding to a GFR of roughly 30.  He has started calcitriol per Dr. Jason Nest recommendation.  His last potassium and phosphorus numbers from February were normal.  His most recent lipid profile shows a total cholesterol of, LDL 89, HDL 34, triglycerides 331.  He is on the maximum dose of rosuvastatin, which she has been taking for many years.  Pacemaker interrogation shows normal device function. Estimated generator longevity is 2 years, lead parameters are all excellent. There is 55% atrial pacing and >99% ventricular pacing, with increased ventricular pacing compared to past downloads.   Since we cannot see him in the office, I am not sure whether he has become device dependent.  His device still records R wave voltages suggesting that he had occasional AV conduction, versus PVCs. He has not had any atrial fibrillation or ventricular tachycardia.  In the past he has had episodes of paroxysmal atrial tachycardia but none since the last device download.  The patient does not have symptoms concerning for COVID-19 infection (fever, chills, cough, or new shortness of breath).  He underwent staged LAD and RCA revascularization with drug-eluting stents in 2005 and has not required subsequent percutaneous revascularization procedures. His most recent nuclear study in January 2014 showed an EF of 34% with a  fixed inferior defect and no reversible ischemia. I'm not sure about the accuracy of left ventricular ejection fraction assessment. His most recent echocardiogram performed around the same date showed a mildly depressed LVEF of 45-50% (note the presence of right ventricular pacing which may alter the accuracy of EF assessment). 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 He is now retired and is an avid Geophysicist/field seismologist : He mostly likes to photograph buildings and has had an exhibit at [employer].  Past Medical History:  Diagnosis Date  . Abnormal Doppler ultrasound of carotid artery 12/16/2012   mildly abnormal doppler . no prior studies to compare to. follow up studies are recommended when clinically indicated.  Marland Kitchen Blepharitis   . Cancer ([place])   . Diabetes mellitus without complication ([place])   . Exogenous obesity   . Gastric ulcer   . GI bleed   . History of stress test 06/06/2012   low risk scan. fixed inferior defect consistant with prior infarction with moderatly reduced EF. no appreciable evidence of ischemia. no significant change from previous study  . Hx of echocardiogram 08-15-2012   EF  45-50 % very difficult study, severe LVH, pacemaker, trace TR,mildly enlarged RV,; aorta mildly calcified  . Pacemaker 2011   St. Jude Accent DDDR ; this is the third generator this pt. has had  . Shingles   . UTI (lower urinary tract infection)    Past Surgical History:  Procedure Laterality Date  . CARDIAC CATHETERIZATION  12-29-2003   rt. coronary angiography was done with a 6 french 4 cm taper coronary cath. This demonstrated  approximately 70 to 75% mildly segmental stenosis in the mid RCA before the acute margin          . CARDIAC CATHETERIZATION  11-19-2003   diagnostic cath,, pt. to get second cath to have stent placed  . cardiac stents     . CATARACT EXTRACTION    . KNEE SURGERY Right   . PROSTATE BIOPSY    . prostate seed implants    . SPINE SURGERY    . WISDOM TOOTH EXTRACTION       Current Meds  Medication Sig  . aspirin 81 MG tablet Take 81 mg by mouth daily.  . Aspirin-Calcium Carbonate 81-777 MG TABS Take by mouth.  . betamethasone acetate-betamethasone sodium phosphate (CELESTONE) 6 (3-3) MG/ML injection Inject into the muscle.  . bisacodyl (DULCOLAX) 5 MG EC tablet Take 5 mg by mouth daily as needed for moderate constipation.  . calcitRIOL (ROCALTROL) 0.25 MCG capsule Take 0.25 mcg by mouth every Monday, Wednesday, and Friday.  . Cholecalciferol (VITAMIN D-1000 MAX ST) 25 MCG (1000 UT) tablet Take by mouth.  . clopidogrel (PLAVIX) 75 MG tablet TAKE 1 TABLET BY MOUTH ONCE DAILY  . Coenzyme Q10 (COQ10) 150 MG CAPS Take by mouth.  . furosemide (LASIX) 40 MG tablet TAKE 1 TABLET BY MOUTH ONCE DAILY  . insulin NPH-regular Human (70-30) 100 UNIT/ML injection Inject into the skin.  Marland Kitchen irbesartan (AVAPRO) 150 MG tablet TAKE 1 TABLET BY MOUTH ONCE DAILY  . levothyroxine (SYNTHROID, LEVOTHROID) 25 MCG tablet Take by mouth.  . metoprolol succinate (TOPROL-XL)  25 MG 24 hr tablet Take 1.5 tablets (37.5 mg total) by mouth daily.  . nitroGLYCERIN (NITROSTAT) 0.4 MG SL tablet Place under the tongue.  Marland Kitchen omega-3 acid ethyl esters (LOVAZA) 1 g capsule Take by mouth.  . Omega-3 Fatty Acids (FISH OIL) 1000 MG CAPS Take 2,400 mg by mouth daily.   . pantoprazole (PROTONIX) 40 MG tablet TAKE 1 TABLET BY MOUTH ONCE DAILY  . polyethylene glycol (MIRALAX / GLYCOLAX) packet Take by mouth.  . potassium chloride (K-DUR) 10 MEQ tablet TAKE 2 TABLETS BY MOUTH ONCE DAILY  . rosuvastatin (CRESTOR) 40 MG tablet TAKE 1 TABLET BY MOUTH ONCE DAILY  . UNABLE TO FIND Take by mouth.  . vitamin C (ASCORBIC ACID) 500 MG tablet Take 500 mg by mouth daily.  . vitamin E 400 UNIT capsule Take 400 Units by mouth daily.   Current Facility-Administered  Medications for the 09/12/18 encounter (Telemedicine) with Sanda Klein, MD  Medication  . betamethasone acetate-betamethasone sodium phosphate (CELESTONE) injection 3 mg     Allergies:   Patient has no known allergies.   Social History   Tobacco Use  . Smoking status: Never Smoker  . Smokeless tobacco: Former Network engineer Use Topics  . Alcohol use: Yes    Comment: daily wine  . Drug use: No     Family Hx: The patient's family history is not on file.  ROS:   Please see the history of present illness.     All other systems reviewed and are negative.   Prior CV studies:   The following studies were reviewed today:  Pacemaker data on August 26, 2018  Labs/Other Tests and Data Reviewed:    EKG:  An ECG dated 01/30/2018 was personally reviewed today and demonstrated:  AV sequential pacing  Recent Labs: July 11, 2018 creatinine 2.29, hemoglobin 15.7, potassium 4.9, normal calcium and phosphorus, TSH 4.38, hemoglobin A1c 7.5%  Recent Lipid Panel Lab Results  Component Value Date/Time   CHOL 322 (H) 04/01/2015 09:27 AM   TRIG 501 (H) 04/01/2015 09:27 AM   HDL 26 (L) 04/01/2015 09:27 AM   CHOLHDL 12.4 (H) 04/01/2015 09:27 AM   LDLCALC NOT CALC 04/01/2015 09:27 AM   LDLDIRECT 138 (H) 04/01/2015 09:27 AM  February 2020 Total cholesterol 189 LDL 89, HDL 34, triglycerides 331  Wt Readings from Last 3 Encounters:  09/12/18 258 lb (117 kg)  01/30/18 269 lb 6.4 oz (122.2 kg)  02/28/17 263 lb (119.3 kg)     Objective:    Vital Signs:  BP 114/60   Pulse 74   Ht 5\' 7"  (1.702 m)   Wt 258 lb (117 kg)   BMI 40.41 kg/m    VITAL SIGNS:  reviewed GEN:  no acute distress EYES:  sclerae anicteric, EOMI - Extraocular Movements Intact RESPIRATORY:  normal respiratory effort, symmetric expansion CARDIOVASCULAR:  no peripheral edema SKIN:  no rash, lesions or ulcers. MUSCULOSKELETAL:  no obvious deformities. NEURO:  alert and oriented x 3, no obvious focal deficit  PSYCH:  normal affect Morbidly obese  ASSESSMENT & PLAN:    1. CHF: Obesity has always placed serious limits on the physical exam, even worse with a video connection.  It sounds like he may be mildly hypervolemic, but has stable NYHA functional class II.  As long as the hypervolemia does not impact his breathing, I would tolerate some edema, to avoid worsening kidney function.  Over the last several years he has had gradual increase in right ventricular pacing  to where it is now 100%, but I cannot say that this has had any impact on his overall functional status. 2. SSS: Heart rate histogram suggests satisfactory sensor settings, a little blunted but appropriate for his activity level 3. 2nd deg AVB: I can tell based on the remote download whether he has progressed to complete heart block or whether he still has some underlying AV conduction. 4. PPM: Remote downloads every 3 months, will increase the frequency to monthly when we approach end of service  5. CAD: Angina free/asymptomatic.  No progression of disease that we know of in 15 years.  S/p DES stenting proximal LAD stenosis and DX2 POBA, June, 2005 and staged RCA DES, August, 2005. Free of angina. Last functional study was in 2014.  He does not have angina pectoris.  Avoid contrast-based procedures due to advancing kidney disease. 6. DM: On insulin.  On ARB for renal protection.  Last hemoglobin A1c 7.5% was an improvement, It may be a challenge to prevent further since he has some symptoms of hypoglycemia.  Hypoglycemia remains a concern as his kidney function worsens. 7. HTN:  Well-controlled 8. Morbid obesity: No progress with weight loss.  This puts him at a big disadvantage for both his metabolic and hemodynamic problems. 9. HLP: On maximum dose rosuvastatin which is of some concern with his kidney disease.  Despite this his LDL is not at target (<70).  It is true that he has not had disease progression that we know is in the last 15 years.   May have to switch to [place] if his kidney function worsens further. 10. CKD stage 3-4: Likely primarily due to diabetic nephropathy.  Creatinine on February labs has worsened from last fall's baseline creatinine around 1.8.  11. OSA: Intolerant of CPAP.  No success with weight loss.  COVID-19 Education: The signs and symptoms of COVID-19 were discussed with the patient and how to seek care for testing (follow up with PCP or arrange E-visit).  The importance of social distancing was discussed today.  Time:   Today, I have spent 22 minutes with the patient with telehealth technology discussing the above problems.     Medication Adjustments/Labs and Tests Ordered: Current medicines are reviewed at length with the patient today.  Concerns regarding medicines are outlined above.   Tests Ordered: No orders of the defined types were placed in this encounter.   Medication Changes: No orders of the defined types were placed in this encounter.   Disposition:  Follow up 9 months, around time of expected need for pacemaker generator change  Signed, Sanda Klein, MD  09/12/2018 10:19 AM    [place]

## 2018-10-04 ENCOUNTER — Telehealth: Payer: Self-pay

## 2018-10-04 ENCOUNTER — Other Ambulatory Visit: Payer: Medicare Other

## 2018-10-04 DIAGNOSIS — Z20822 Contact with and (suspected) exposure to covid-19: Secondary | ICD-10-CM

## 2018-10-04 DIAGNOSIS — R6889 Other general symptoms and signs: Secondary | ICD-10-CM | POA: Diagnosis not present

## 2018-10-04 DIAGNOSIS — R0602 Shortness of breath: Secondary | ICD-10-CM | POA: Diagnosis not present

## 2018-10-04 NOTE — Addendum Note (Signed)
Addended by: Kendrick Ranch on: 10/04/2018 02:14 PM   Modules accepted: Orders

## 2018-10-04 NOTE — Addendum Note (Signed)
Addended by: Kendrick Ranch on: 10/04/2018 02:08 PM   Modules accepted: Orders

## 2018-10-04 NOTE — Telephone Encounter (Signed)
Dr. Stephanie Acre ordered COVID 19 testing appointment made.

## 2018-10-07 LAB — NOVEL CORONAVIRUS, NAA: SARS-CoV-2, NAA: NOT DETECTED

## 2018-10-11 ENCOUNTER — Telehealth: Payer: Self-pay

## 2018-10-11 NOTE — Telephone Encounter (Signed)
I was helping the pt send a transmission however, I was unsuccessful. I gave the pt the number to [place] support to get additional help. I told him the nurse will give him a call Tuesday when she see the transmission.

## 2018-10-11 NOTE — Telephone Encounter (Signed)
Spoke with pt who state he went to work today and felt he may have over did it walking up and down stairs. He report feeling faint, SOB and off balance. Once he got home he checked BP but machine kept reading error. He checked it again with different machine and it ws 101/56. Since resting he report felling a little better but still concerned. He is requesting if he should send a remote download.

## 2018-10-11 NOTE — Telephone Encounter (Signed)
Pt Dr. Loletha Grayer he would like pt to do a remote download. Pt unsure how to do it. Will route to device clinic.

## 2018-10-15 ENCOUNTER — Telehealth: Payer: Self-pay | Admitting: *Deleted

## 2018-10-15 ENCOUNTER — Telehealth: Payer: Self-pay | Admitting: Cardiovascular Disease

## 2018-10-15 NOTE — Telephone Encounter (Signed)
Transmission received.

## 2018-10-15 NOTE — Telephone Encounter (Signed)
LMOM to discuss symptoms.

## 2018-10-15 NOTE — Telephone Encounter (Signed)
The patient has been set up with a virtual appointment with Dr. Sallyanne Kuster tomorrow, 10/16/2018. He has verbalized his understanding.

## 2018-10-15 NOTE — Telephone Encounter (Signed)
Smart phone/video visit/my chart/call cell phone #/pre reg complete/consent obtained -- ttf

## 2018-10-16 ENCOUNTER — Encounter: Payer: Self-pay | Admitting: Cardiovascular Disease

## 2018-10-16 ENCOUNTER — Telehealth (INDEPENDENT_AMBULATORY_CARE_PROVIDER_SITE_OTHER): Payer: Medicare Other | Admitting: Cardiovascular Disease

## 2018-10-16 VITALS — Ht 66.5 in | Wt 258.0 lb

## 2018-10-16 DIAGNOSIS — I441 Atrioventricular block, second degree: Secondary | ICD-10-CM | POA: Diagnosis not present

## 2018-10-16 DIAGNOSIS — E1122 Type 2 diabetes mellitus with diabetic chronic kidney disease: Secondary | ICD-10-CM

## 2018-10-16 DIAGNOSIS — I2510 Atherosclerotic heart disease of native coronary artery without angina pectoris: Secondary | ICD-10-CM

## 2018-10-16 DIAGNOSIS — N183 Chronic kidney disease, stage 3 unspecified: Secondary | ICD-10-CM

## 2018-10-16 DIAGNOSIS — E782 Mixed hyperlipidemia: Secondary | ICD-10-CM | POA: Diagnosis not present

## 2018-10-16 DIAGNOSIS — G4733 Obstructive sleep apnea (adult) (pediatric): Secondary | ICD-10-CM

## 2018-10-16 DIAGNOSIS — I495 Sick sinus syndrome: Secondary | ICD-10-CM

## 2018-10-16 DIAGNOSIS — I5042 Chronic combined systolic (congestive) and diastolic (congestive) heart failure: Secondary | ICD-10-CM

## 2018-10-16 DIAGNOSIS — Z950 Presence of cardiac pacemaker: Secondary | ICD-10-CM | POA: Diagnosis not present

## 2018-10-16 DIAGNOSIS — Z794 Long term (current) use of insulin: Secondary | ICD-10-CM | POA: Diagnosis not present

## 2018-10-16 NOTE — Patient Instructions (Signed)
Medication Instructions:  Stop taking Furosemide  Stop taking Potassium Supplement Stop taking Irbesartan Continue all other medications  If you need a refill on your cardiac medications before your next appointment, please call your pharmacy.   Lab work: None ordered  Testing/Procedures: None ordered  Follow-Up: At [facility], you and your health needs are our priority.  As part of our continuing mission to provide you with exceptional heart care, we have created designated Provider Care Teams.  These Care Teams include your primary Cardiologist (physician) and Advanced Practice Providers (APPs -  Physician Assistants and Nurse Practitioners) who all work together to provide you with the care you need, when you need it  Wear medium thigh high compression stockings You can purchase them at [facility] on [address] make sure they measure you to get the correct size.  If you to decide on a abdominal binder let us know we will mail you a prescription  Scheduler will call you with a appointment with Dr.Croitoru in 1 month

## 2018-10-16 NOTE — Progress Notes (Addendum)
Virtual Visit via Video Note   This visit type was conducted due to national recommendations for restrictions regarding the COVID-19 Pandemic (e.g. social distancing) in an effort to limit this patient's exposure and mitigate transmission in our community.  Due to his co-morbid illnesses, this patient is at least at moderate risk for complications without adequate follow up.  This format is felt to be most appropriate for this patient at this time.  All issues noted in this document were discussed and addressed.  A limited physical exam was performed with this format.  Please refer to the patient's chart for his consent to telehealth for [facility].   Evaluation Performed:  Follow-up visit  Date:  10/16/2018   ID:  [name], DOB Jun 12, 1931, MRN [id number]  Patient Location: Home Provider Location: Home  PCP:  [name], MD  Cardiologist:  Rayfield Beem Electrophysiologist:  None   Chief Complaint: Weakness and dizziness; CHF, SSS/2nd deg AV block, PM   History of Present Illness:    [name] is a 85 y.o. male with tachycardia-bradycardia syndrome (sinus node dysfunction, second-degree AV block, episodes of paroxysmal atrial tachycardia), status post dual-chamber permanent pacemaker implantation (initial implant 1990, last generator change St. Jude Accent DR RF 2011), congestive heart failure with combined systolic and diastolic dysfunction (mildly depressed LVEF 45 to 50% by last echo in 2014), coronary artery disease with remote percutaneous revascularization (no PCI since 2005), obesity with type 2 diabetes mellitus and mixed hyperlipidemia, chronic kidney disease stage III, systemic hypertension, obstructive sleep apnea intolerant to CPAP.  He called with complaints of dizziness.  It sounds very much compatible with orthostatic hypotension, but only occurs after he has been upright for at least 10 or 15 minutes.  It is particularly bad if he has to walk longer distances.  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  He felt very poorly and when he returned home his knees buckled and he almost fell, although he did not have loss of consciousness.  At my request, please recorded it is blood pressure sitting (125/67) and after standing for several minutes consist 97/55).  He does report more fatigue and is harder to climb stairs without stopping to catch his breath.  However his weight is stable.  Has been no change in his intake of food and fluids.  After he called with complaints of dizziness we did an additional download of his device.  On May 22, the baby very poorly while standing for prolonged period of time he had several episodes of rapid ventricular rates (atrial sensed, ventricular tract) which could represents just sinus tachycardia, judging by the fact that the rhythm shows slight variations.  The real-time electrograms at the time of the download on May 26 shows atrial sensed ventricular paced rhythm at 95 bpm.  This was seen as of the last evaluation, he has virtually 100% ventricular pacing (unable to tell if he is now device dependent).  His episodes of weakness and dizziness have not been associated with hypoglycemia.  In fact his blood sugar was a little high.  He denies angina, focal neurological complaints or any changes in his medications.  His most recent creatinine was 2.29, corresponding to a GFR of roughly 30.  He has started calcitriol per Dr. Jason Nest recommendation.  His last potassium and phosphorus numbers from February  were normal.  His most recent lipid profile shows a total cholesterol of, LDL 89, HDL 34, triglycerides 331.  He is on the maximum dose of rosuvastatin, which she has been taking for many years.  Pacemaker interrogation shows normal device function. Estimated generator longevity is 2 years, lead  parameters are all excellent. There is 55% atrial pacing and >99% ventricular pacing, with increased ventricular pacing compared to past downloads.   Since we cannot see him in the office, I am not sure whether he has become device dependent.  His device still records R wave voltages suggesting that he had occasional AV conduction, versus PVCs. He has not had any atrial fibrillation or ventricular tachycardia.  In the past he has had episodes of paroxysmal atrial tachycardia but none since the last device download.  The patient does not have symptoms concerning for COVID-19 infection (fever, chills, cough, or new shortness of breath).   He underwent staged LAD and RCA revascularization with drug-eluting stents in 2005 and has not required subsequent percutaneous revascularization procedures. His most recent nuclear study in January 2014 showed an EF of 34% with a fixed inferior defect and no reversible ischemia. I'm not sure about the accuracy of left ventricular ejection fraction assessment. His most recent echocardiogram performed around the same date showed a mildly depressed LVEF of 45-50% (note the presence of right ventricular pacing which may alter the accuracy of EF assessment). 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 He is now retired and is an avid Geophysicist/field seismologist : He mostly likes to photograph buildings and has had an exhibit at [employer].  Past Medical History:  Diagnosis Date  . Abnormal Doppler ultrasound of carotid artery 12/16/2012   mildly abnormal doppler . no prior studies to compare to. follow up studies are recommended when clinically indicated.  Marland Kitchen Blepharitis   . Cancer ([place])   . Diabetes mellitus without complication ([place])   . Exogenous obesity   . Gastric ulcer   . GI bleed   . History of stress test 06/06/2012    low risk scan. fixed inferior defect consistant with prior infarction with moderatly reduced EF. no appreciable evidence of ischemia. no significant change from previous study  . Hx of echocardiogram 08-15-2012   EF  45-50 % very difficult study, severe LVH, pacemaker, trace TR,mildly enlarged RV,; aorta mildly calcified  . Pacemaker 2011   St. Jude Accent DDDR ; this is the third generator this pt. has had  . Shingles   . UTI (lower urinary tract infection)    Past Surgical History:  Procedure Laterality Date  . CARDIAC CATHETERIZATION  12-29-2003   rt. coronary angiography was done with a 6 french 4 cm taper coronary cath. This demonstrated  approximately 70 to 75% mildly segmental stenosis in the mid RCA before the acute margin          . CARDIAC CATHETERIZATION  11-19-2003   diagnostic cath,, pt. to get second cath to have stent placed  . cardiac stents    . CATARACT EXTRACTION    . KNEE SURGERY Right   . PROSTATE BIOPSY    . prostate seed implants    . SPINE SURGERY    . WISDOM TOOTH EXTRACTION       Current Meds  Medication Sig  . aspirin 81 MG tablet Take 81 mg by mouth daily.  Marland Kitchen  betamethasone acetate-betamethasone sodium phosphate (CELESTONE) 6 (3-3) MG/ML injection Inject into the muscle.  . bisacodyl (DULCOLAX) 5 MG EC tablet Take 5 mg by mouth daily as needed for moderate constipation.  . calcitRIOL (ROCALTROL) 0.25 MCG capsule Take 0.25 mcg by mouth every Monday, Wednesday, and Friday.  . Cholecalciferol (VITAMIN D-1000 MAX ST) 25 MCG (1000 UT) tablet Take by mouth.  . clopidogrel (PLAVIX) 75 MG tablet TAKE 1 TABLET BY MOUTH ONCE DAILY  . Coenzyme Q10 (COQ10) 150 MG CAPS Take by mouth.  . insulin NPH-regular Human (70-30) 100 UNIT/ML injection Inject 65 Units into the skin 2 (two) times daily with a meal.   . levothyroxine (SYNTHROID, LEVOTHROID) 25 MCG tablet Take by mouth.  . metoprolol succinate (TOPROL-XL) 25 MG 24 hr tablet Take 1.5 tablets (37.5 mg total) by mouth  daily.  . nitroGLYCERIN (NITROSTAT) 0.4 MG SL tablet Place under the tongue.  Marland Kitchen omega-3 acid ethyl esters (LOVAZA) 1 g capsule Take by mouth.  . Omega-3 Fatty Acids (FISH OIL) 1000 MG CAPS Take 2,400 mg by mouth daily.   . pantoprazole (PROTONIX) 40 MG tablet TAKE 1 TABLET BY MOUTH ONCE DAILY  . polyethylene glycol (MIRALAX / GLYCOLAX) packet Take by mouth.  . rosuvastatin (CRESTOR) 40 MG tablet TAKE 1 TABLET BY MOUTH ONCE DAILY  . vitamin C (ASCORBIC ACID) 500 MG tablet Take 500 mg by mouth daily.  . vitamin E 400 UNIT capsule Take 400 Units by mouth daily.  . [DISCONTINUED] furosemide (LASIX) 40 MG tablet TAKE 1 TABLET BY MOUTH ONCE DAILY  . [DISCONTINUED] potassium chloride (K-DUR) 10 MEQ tablet TAKE 2 TABLETS BY MOUTH ONCE DAILY   Current Facility-Administered Medications for the 10/16/18 encounter (Telemedicine) with Sanda Klein, MD  Medication  . betamethasone acetate-betamethasone sodium phosphate (CELESTONE) injection 3 mg     Allergies:   Patient has no known allergies.   Social History   Tobacco Use  . Smoking status: Never Smoker  . Smokeless tobacco: Former Network engineer Use Topics  . Alcohol use: Yes    Comment: daily wine  . Drug use: No     Family Hx: The patient's family history is not on file.  ROS:   Please see the history of present illness.     All other systems are reviewed and are negative  Prior CV studies:   The following studies were reviewed today:  Pacemaker data on August 26, 2018 and on Oct 15, 2018  Labs/Other Tests and Data Reviewed:    EKG:  An ECG dated 01/30/2018 was personally reviewed today and demonstrated:  AV sequential pacing  Recent Labs: July 11, 2018 creatinine 2.29, hemoglobin 15.7, potassium 4.9, normal calcium and phosphorus, TSH 4.38, hemoglobin A1c 7.5%  Recent Lipid Panel Lab Results  Component Value Date/Time   CHOL 322 (H) 04/01/2015 09:27 AM   TRIG 501 (H) 04/01/2015 09:27 AM   HDL 26 (L) 04/01/2015 09:27  AM   CHOLHDL 12.4 (H) 04/01/2015 09:27 AM   LDLCALC NOT CALC 04/01/2015 09:27 AM   LDLDIRECT 138 (H) 04/01/2015 09:27 AM  February 2020 Total cholesterol 189 LDL 89, HDL 34, triglycerides 331  Wt Readings from Last 3 Encounters:  10/16/18 258 lb (117 kg)  09/12/18 258 lb (117 kg)  01/30/18 269 lb 6.4 oz (122.2 kg)     Objective:    Vital Signs:  Ht 5' 6.5" (1.689 m)   Wt 258 lb (117 kg)   BMI 41.02 kg/m    VITAL SIGNS:  reviewed GEN:  no acute distress EYES:  sclerae anicteric, EOMI - Extraocular Movements Intact RESPIRATORY:  normal respiratory effort, symmetric expansion CARDIOVASCULAR:  no peripheral edema SKIN:  no rash, lesions or ulcers. MUSCULOSKELETAL:  no obvious deformities. NEURO:  alert and oriented x 3, no obvious focal deficit PSYCH:  normal affect Morbidly obese  ASSESSMENT & PLAN:    1. CHF:  Just as was the case at his last appointment, it is very difficult to make an assessment of volume status with a video link, especially in view of his obese body habitus.  His weight has not changed and he has not had any worsening edema.  Over the last several years he has had gradual increase in right ventricular pacing to where it is now 100%, but I cannot say that this has had any impact on his overall functional status. 2. Orthostatic hypotension: I asked him to stop his irbesartan permanently (I think this was prescribed mostly for renal function protection, rather than hypertension) and he will temporarily hold his furosemide as well.  The most likely cause for this is diabetic neuropathy, although he may be developing central autonomic dysfunction as well.  Recommended that he has to wear compression stockings whenever he will be standing or walking for longer periods of time.  He may also require an abdominal binder.  We cannot recommend salt loading or fludrocortisone since this will increase the likelihood of heart failure exacerbation. 3. SSS: Heart rate histogram  suggests satisfactory sensor settings, a little blunted but appropriate for his activity level 4. 2nd deg AVB:  Will have to wait until his office appointment to see whether he is truly device dependent or just has frequent ventricular pacing due to second-degree AV block. 5. PPM: Remote downloads every 3 months, will increase the frequency to monthly when we approach end of service  6. CAD: Remains angina free and his current symptoms do not sound suggestive of coronary problems.  No progression of disease that we know of in 15 years.  S/p DES stenting proximal LAD stenosis and DX2 POBA, June, 2005 and staged RCA DES, August, 2005. Free of angina. Last functional study was in 2014.  He does not have angina pectoris.  Avoid contrast-based procedures due to advancing kidney disease. 7. DM: On insulin.  On ARB for renal protection, which we will stop today. Last hemoglobin A1c 7.5% was an improvement, It may be a challenge to prevent further since he has some symptoms of hypoglycemia.  Hypoglycemia remains a concern as his kidney function worsens. 8. HTN:  Well-controlled 9. Morbid obesity: No progress with weight loss.  This puts him at a big disadvantage for both his metabolic and hemodynamic problems. 10. HLP: On maximum dose rosuvastatin which is of some concern with his kidney disease.  Despite this his LDL is not at target (<70).  It is true that he has not had disease progression that we know is in the last 15 years.  May have to switch to [place] if his kidney function worsens further. 11. CKD stage 3-4: Likely primarily due to diabetic nephropathy.  Creatinine on February labs has worsened from last fall's baseline creatinine around 1.8.  12. OSA: Intolerant of CPAP.  No success with weight loss.  COVID-19 Education: The signs and symptoms of COVID-19 were discussed with the patient and how to seek care for testing (follow up with PCP or arrange E-visit).  The importance of social distancing was  discussed today.  Time:  Today, I have spent 22 minutes with the patient with telehealth technology discussing the above problems.     Medication Adjustments/Labs and Tests Ordered: Current medicines are reviewed at length with the patient today.  Concerns regarding medicines are outlined above.   Tests Ordered: No orders of the defined types were placed in this encounter.   Medication Changes: No orders of the defined types were placed in this encounter.   Disposition:  Follow up 9 months, around time of expected need for pacemaker generator change  Signed, Sanda Klein, MD  10/16/2018 11:11 AM    [place]

## 2018-10-16 NOTE — Telephone Encounter (Signed)
Had visit with Dr. Sallyanne Kuster today. See note in epic for further.    [name] [address]" [place], PA-C 10/16/2018 12:46 PM

## 2018-10-30 ENCOUNTER — Encounter: Payer: Medicare Other | Admitting: Cardiovascular Disease

## 2018-11-08 ENCOUNTER — Other Ambulatory Visit: Payer: Self-pay | Admitting: Cardiovascular Disease

## 2018-11-13 ENCOUNTER — Ambulatory Visit (INDEPENDENT_AMBULATORY_CARE_PROVIDER_SITE_OTHER): Payer: Medicare Other | Admitting: Podiatry

## 2018-11-13 ENCOUNTER — Encounter: Payer: Self-pay | Admitting: Podiatry

## 2018-11-13 ENCOUNTER — Other Ambulatory Visit: Payer: Self-pay

## 2018-11-13 DIAGNOSIS — E1122 Type 2 diabetes mellitus with diabetic chronic kidney disease: Secondary | ICD-10-CM

## 2018-11-13 DIAGNOSIS — B351 Tinea unguium: Secondary | ICD-10-CM

## 2018-11-13 DIAGNOSIS — Z794 Long term (current) use of insulin: Secondary | ICD-10-CM

## 2018-11-13 DIAGNOSIS — M79674 Pain in right toe(s): Secondary | ICD-10-CM | POA: Diagnosis not present

## 2018-11-13 DIAGNOSIS — M79675 Pain in left toe(s): Secondary | ICD-10-CM | POA: Diagnosis not present

## 2018-11-13 DIAGNOSIS — N183 Type 2 diabetes mellitus with diabetic chronic kidney disease: Secondary | ICD-10-CM

## 2018-11-13 HISTORY — DX: Tinea unguium: B35.1

## 2018-11-20 DIAGNOSIS — J9601 Acute respiratory failure with hypoxia: Secondary | ICD-10-CM

## 2018-11-20 DIAGNOSIS — G459 Transient cerebral ischemic attack, unspecified: Secondary | ICD-10-CM

## 2018-11-20 DIAGNOSIS — I6522 Occlusion and stenosis of left carotid artery: Secondary | ICD-10-CM

## 2018-11-20 HISTORY — DX: Transient cerebral ischemic attack, unspecified: G45.9

## 2018-11-20 HISTORY — DX: Occlusion and stenosis of left carotid artery: I65.22

## 2018-11-20 HISTORY — DX: Acute respiratory failure with hypoxia: J96.01

## 2018-11-24 ENCOUNTER — Emergency Department (HOSPITAL_COMMUNITY): Payer: Medicare Other

## 2018-11-24 ENCOUNTER — Observation Stay (HOSPITAL_COMMUNITY): Payer: Medicare Other

## 2018-11-24 ENCOUNTER — Encounter (HOSPITAL_COMMUNITY): Payer: Self-pay | Admitting: Emergency Medicine

## 2018-11-24 ENCOUNTER — Observation Stay (HOSPITAL_COMMUNITY)
Admission: EM | Admit: 2018-11-24 | Discharge: 2018-11-26 | Disposition: A | Discharge status: Home / Self Care | Payer: Medicare Other | Attending: Internal Medicine | Admitting: Internal Medicine

## 2018-11-24 ENCOUNTER — Other Ambulatory Visit: Payer: Self-pay

## 2018-11-24 DIAGNOSIS — Z03818 Encounter for observation for suspected exposure to other biological agents ruled out: Secondary | ICD-10-CM | POA: Diagnosis not present

## 2018-11-24 DIAGNOSIS — E872 Acidosis: Secondary | ICD-10-CM | POA: Insufficient documentation

## 2018-11-24 DIAGNOSIS — E039 Hypothyroidism, unspecified: Secondary | ICD-10-CM | POA: Diagnosis not present

## 2018-11-24 DIAGNOSIS — G459 Transient cerebral ischemic attack, unspecified: Secondary | ICD-10-CM

## 2018-11-24 DIAGNOSIS — R4701 Aphasia: Secondary | ICD-10-CM | POA: Diagnosis not present

## 2018-11-24 DIAGNOSIS — Z955 Presence of coronary angioplasty implant and graft: Secondary | ICD-10-CM | POA: Diagnosis not present

## 2018-11-24 DIAGNOSIS — G4733 Obstructive sleep apnea (adult) (pediatric): Secondary | ICD-10-CM | POA: Insufficient documentation

## 2018-11-24 DIAGNOSIS — Z1159 Encounter for screening for other viral diseases: Secondary | ICD-10-CM | POA: Insufficient documentation

## 2018-11-24 DIAGNOSIS — N183 Chronic kidney disease, stage 3 unspecified: Secondary | ICD-10-CM | POA: Diagnosis present

## 2018-11-24 DIAGNOSIS — Z79890 Hormone replacement therapy (postmenopausal): Secondary | ICD-10-CM | POA: Insufficient documentation

## 2018-11-24 DIAGNOSIS — Z6841 Body Mass Index (BMI) 40.0 and over, adult: Secondary | ICD-10-CM | POA: Insufficient documentation

## 2018-11-24 DIAGNOSIS — Z7982 Long term (current) use of aspirin: Secondary | ICD-10-CM | POA: Diagnosis not present

## 2018-11-24 DIAGNOSIS — R4182 Altered mental status, unspecified: Secondary | ICD-10-CM | POA: Diagnosis not present

## 2018-11-24 DIAGNOSIS — I441 Atrioventricular block, second degree: Secondary | ICD-10-CM | POA: Insufficient documentation

## 2018-11-24 DIAGNOSIS — I5042 Chronic combined systolic (congestive) and diastolic (congestive) heart failure: Secondary | ICD-10-CM | POA: Diagnosis not present

## 2018-11-24 DIAGNOSIS — Z794 Long term (current) use of insulin: Secondary | ICD-10-CM | POA: Diagnosis not present

## 2018-11-24 DIAGNOSIS — R4702 Dysphasia: Secondary | ICD-10-CM | POA: Insufficient documentation

## 2018-11-24 DIAGNOSIS — R079 Chest pain, unspecified: Secondary | ICD-10-CM | POA: Diagnosis present

## 2018-11-24 DIAGNOSIS — N184 Chronic kidney disease, stage 4 (severe): Secondary | ICD-10-CM | POA: Diagnosis not present

## 2018-11-24 DIAGNOSIS — Z7902 Long term (current) use of antithrombotics/antiplatelets: Secondary | ICD-10-CM | POA: Diagnosis not present

## 2018-11-24 DIAGNOSIS — I495 Sick sinus syndrome: Secondary | ICD-10-CM | POA: Diagnosis not present

## 2018-11-24 DIAGNOSIS — I255 Ischemic cardiomyopathy: Secondary | ICD-10-CM | POA: Diagnosis not present

## 2018-11-24 DIAGNOSIS — Z950 Presence of cardiac pacemaker: Secondary | ICD-10-CM | POA: Diagnosis not present

## 2018-11-24 DIAGNOSIS — J9601 Acute respiratory failure with hypoxia: Secondary | ICD-10-CM | POA: Insufficient documentation

## 2018-11-24 DIAGNOSIS — E1122 Type 2 diabetes mellitus with diabetic chronic kidney disease: Secondary | ICD-10-CM | POA: Insufficient documentation

## 2018-11-24 DIAGNOSIS — R2681 Unsteadiness on feet: Secondary | ICD-10-CM | POA: Diagnosis not present

## 2018-11-24 DIAGNOSIS — R0602 Shortness of breath: Secondary | ICD-10-CM | POA: Diagnosis not present

## 2018-11-24 DIAGNOSIS — R29818 Other symptoms and signs involving the nervous system: Secondary | ICD-10-CM | POA: Diagnosis not present

## 2018-11-24 DIAGNOSIS — R778 Other specified abnormalities of plasma proteins: Secondary | ICD-10-CM | POA: Diagnosis present

## 2018-11-24 DIAGNOSIS — E119 Type 2 diabetes mellitus without complications: Secondary | ICD-10-CM | POA: Diagnosis not present

## 2018-11-24 DIAGNOSIS — Z79899 Other long term (current) drug therapy: Secondary | ICD-10-CM | POA: Insufficient documentation

## 2018-11-24 DIAGNOSIS — J9600 Acute respiratory failure, unspecified whether with hypoxia or hypercapnia: Secondary | ICD-10-CM | POA: Diagnosis present

## 2018-11-24 DIAGNOSIS — I6522 Occlusion and stenosis of left carotid artery: Secondary | ICD-10-CM | POA: Diagnosis not present

## 2018-11-24 DIAGNOSIS — I2510 Atherosclerotic heart disease of native coronary artery without angina pectoris: Secondary | ICD-10-CM | POA: Diagnosis not present

## 2018-11-24 DIAGNOSIS — M6281 Muscle weakness (generalized): Secondary | ICD-10-CM | POA: Insufficient documentation

## 2018-11-24 HISTORY — DX: Hypothyroidism, unspecified: E03.9

## 2018-11-24 LAB — COMPREHENSIVE METABOLIC PANEL
ALT: 22 U/L (ref 0–44)
AST: 21 U/L (ref 15–41)
Albumin: 3.7 g/dL (ref 3.5–5.0)
Alkaline Phosphatase: 45 U/L (ref 38–126)
Anion gap: 10 (ref 5–15)
BUN: 35 mg/dL — ABNORMAL HIGH (ref 8–23)
CO2: 23 mmol/L (ref 22–32)
Calcium: 9.1 mg/dL (ref 8.9–10.3)
Chloride: 106 mmol/L (ref 98–111)
Creatinine, Ser: 1.91 mg/dL — ABNORMAL HIGH (ref 0.61–1.24)
GFR calc Af Amer: 36 mL/min — ABNORMAL LOW (ref >60)
GFR calc non Af Amer: 31 mL/min — ABNORMAL LOW (ref >60)
Glucose, Bld: 136 mg/dL — ABNORMAL HIGH (ref 70–99)
Potassium: 4.5 mmol/L (ref 3.5–5.1)
Sodium: 139 mmol/L (ref 135–145)
Total Bilirubin: 0.7 mg/dL (ref 0.3–1.2)
Total Protein: 6.9 g/dL (ref 6.5–8.1)

## 2018-11-24 LAB — DIFFERENTIAL
Abs Immature Granulocytes: 0.04 10*3/uL (ref 0.00–0.07)
Basophils Absolute: 0 10*3/uL (ref 0.0–0.1)
Basophils Relative: 0 %
Eosinophils Absolute: 0.1 10*3/uL (ref 0.0–0.5)
Eosinophils Relative: 1 %
Immature Granulocytes: 1 %
Lymphocytes Relative: 19 %
Lymphs Abs: 1.7 10*3/uL (ref 0.7–4.0)
Monocytes Absolute: 1 10*3/uL (ref 0.1–1.0)
Monocytes Relative: 12 %
Neutro Abs: 5.8 10*3/uL (ref 1.7–7.7)
Neutrophils Relative %: 67 %

## 2018-11-24 LAB — CBC
HCT: 50.7 % (ref 39.0–52.0)
Hemoglobin: 16.2 g/dL (ref 13.0–17.0)
MCH: 30.2 pg (ref 26.0–34.0)
MCHC: 32 g/dL (ref 30.0–36.0)
MCV: 94.4 fL (ref 80.0–100.0)
Platelets: 147 10*3/uL — ABNORMAL LOW (ref 150–400)
RBC: 5.37 MIL/uL (ref 4.22–5.81)
RDW: 13.9 % (ref 11.5–15.5)
WBC: 8.6 10*3/uL (ref 4.0–10.5)
nRBC: 0 % (ref 0.0–0.2)

## 2018-11-24 LAB — I-STAT CHEM 8, ED
BUN: 39 mg/dL — ABNORMAL HIGH (ref 8–23)
Calcium, Ion: 1.03 mmol/L — ABNORMAL LOW (ref 1.15–1.40)
Chloride: 106 mmol/L (ref 98–111)
Creatinine, Ser: 1.9 mg/dL — ABNORMAL HIGH (ref 0.61–1.24)
Glucose, Bld: 136 mg/dL — ABNORMAL HIGH (ref 70–99)
HCT: 49 % (ref 39.0–52.0)
Hemoglobin: 16.7 g/dL (ref 13.0–17.0)
Potassium: 4.5 mmol/L (ref 3.5–5.1)
Sodium: 139 mmol/L (ref 135–145)
TCO2: 27 mmol/L (ref 22–32)

## 2018-11-24 LAB — APTT: aPTT: 31 seconds (ref 24–36)

## 2018-11-24 LAB — TROPONIN I (HIGH SENSITIVITY): Troponin I (High Sensitivity): 20 ng/L — ABNORMAL HIGH (ref <18)

## 2018-11-24 LAB — HEMOGLOBIN A1C
Hgb A1c MFr Bld: 7 % — ABNORMAL HIGH (ref 4.8–5.6)
Mean Plasma Glucose: 154.2 mg/dL

## 2018-11-24 LAB — PROTIME-INR
INR: 1.1 (ref 0.8–1.2)
Prothrombin Time: 14.2 seconds (ref 11.4–15.2)

## 2018-11-24 LAB — SARS CORONAVIRUS 2 BY RT PCR (HOSPITAL ORDER, PERFORMED IN ~~LOC~~ HOSPITAL LAB): SARS Coronavirus 2: NEGATIVE

## 2018-11-24 LAB — CBG MONITORING, ED: Glucose-Capillary: 106 mg/dL — ABNORMAL HIGH (ref 70–99)

## 2018-11-24 MED ORDER — SODIUM CHLORIDE 0.9% FLUSH: 3.0000 mL | Freq: Once | INTRAVENOUS | Status: DC

## 2018-11-24 MED ORDER — INSULIN DETEMIR 100 UNIT/ML ~~LOC~~ SOLN
40.0000 [IU] | Freq: Two times a day (BID) | SUBCUTANEOUS | Status: DC
  Administered 2018-11-24 – 2018-11-26 (×4): 40 [IU] via SUBCUTANEOUS
  Filled 2018-11-24 (×5): qty 0.4

## 2018-11-24 MED ORDER — OMEGA-3-ACID ETHYL ESTERS 1 G PO CAPS
2.0000 g | ORAL_CAPSULE | Freq: Every day | ORAL | Status: DC
  Administered 2018-11-25 – 2018-11-26 (×2): 2 g via ORAL
  Filled 2018-11-24 (×2): qty 2

## 2018-11-24 MED ORDER — ASPIRIN 81 MG PO CHEW
162.0000 mg | CHEWABLE_TABLET | Freq: Once | ORAL | Status: AC
  Administered 2018-11-24: 162 mg via ORAL
  Filled 2018-11-24: qty 2

## 2018-11-24 MED ORDER — ASPIRIN EC 81 MG PO TBEC
81.0000 mg | DELAYED_RELEASE_TABLET | Freq: Every day | ORAL | Status: DC
  Administered 2018-11-25 – 2018-11-26 (×2): 81 mg via ORAL
  Filled 2018-11-24 (×2): qty 1

## 2018-11-24 MED ORDER — CLOPIDOGREL BISULFATE 75 MG PO TABS
75.0000 mg | ORAL_TABLET | Freq: Every day | ORAL | Status: DC
  Administered 2018-11-25 – 2018-11-26 (×2): 75 mg via ORAL
  Filled 2018-11-24 (×2): qty 1

## 2018-11-24 MED ORDER — ROSUVASTATIN CALCIUM 20 MG PO TABS
40.0000 mg | ORAL_TABLET | Freq: Every day | ORAL | Status: DC
  Administered 2018-11-25: 40 mg via ORAL
  Filled 2018-11-24 (×2): qty 2

## 2018-11-24 MED ORDER — ACETAMINOPHEN 325 MG PO TABS: 650.0000 mg | ORAL_TABLET | ORAL | Status: DC | PRN

## 2018-11-24 MED ORDER — STROKE: EARLY STAGES OF RECOVERY BOOK
Freq: Once | Status: AC
  Administered 2018-11-25: 12:00:00
  Filled 2018-11-24: qty 1

## 2018-11-24 MED ORDER — SODIUM CHLORIDE 0.9 % IV SOLN
INTRAVENOUS | Status: AC
  Administered 2018-11-24: 23:00:00 via INTRAVENOUS

## 2018-11-24 MED ORDER — IOHEXOL 350 MG/ML SOLN
50.0000 mL | Freq: Once | INTRAVENOUS | Status: AC | PRN
  Administered 2018-11-24: 50 mL via INTRAVENOUS

## 2018-11-24 MED ORDER — HEPARIN SODIUM (PORCINE) 5000 UNIT/ML IJ SOLN
5000.0000 [IU] | Freq: Three times a day (TID) | INTRAMUSCULAR | Status: DC
  Administered 2018-11-24 – 2018-11-26 (×5): 5000 [IU] via SUBCUTANEOUS
  Filled 2018-11-24 (×5): qty 1

## 2018-11-24 MED ORDER — ACETAMINOPHEN 160 MG/5ML PO SOLN: 650.0000 mg | ORAL | Status: DC | PRN

## 2018-11-24 MED ORDER — PANTOPRAZOLE SODIUM 40 MG PO TBEC
40.0000 mg | DELAYED_RELEASE_TABLET | Freq: Every day | ORAL | Status: DC
  Administered 2018-11-25 – 2018-11-26 (×2): 40 mg via ORAL
  Filled 2018-11-24 (×2): qty 1

## 2018-11-24 MED ORDER — INSULIN ASPART 100 UNIT/ML ~~LOC~~ SOLN
0.0000 [IU] | Freq: Three times a day (TID) | SUBCUTANEOUS | Status: DC
  Administered 2018-11-25: 3 [IU] via SUBCUTANEOUS
  Administered 2018-11-25: 2 [IU] via SUBCUTANEOUS

## 2018-11-24 MED ORDER — SENNOSIDES-DOCUSATE SODIUM 8.6-50 MG PO TABS: 1.0000 | ORAL_TABLET | Freq: Every evening | ORAL | Status: DC | PRN

## 2018-11-24 MED ORDER — ACETAMINOPHEN 650 MG RE SUPP: 650.0000 mg | RECTAL | Status: DC | PRN

## 2018-11-24 MED ORDER — LEVOTHYROXINE SODIUM 25 MCG PO TABS
25.0000 ug | ORAL_TABLET | Freq: Every day | ORAL | Status: DC
  Administered 2018-11-25 – 2018-11-26 (×2): 25 ug via ORAL
  Filled 2018-11-24 (×2): qty 1

## 2018-11-24 MED ORDER — INSULIN ASPART 100 UNIT/ML ~~LOC~~ SOLN: 0.0000 [IU] | Freq: Every day | SUBCUTANEOUS | Status: DC

## 2018-11-24 NOTE — ED Triage Notes (Addendum)
Pt reports he sat down at computer around 16:45pm and could not understand the keys on the computer.  Reports unable to see hands on a clock and unable to read..  Also reports expressive aphasia. Symptoms have now resolved.  No arm drift.  Pt reading without difficulty and naming objects.

## 2018-11-24 NOTE — H&P (Addendum)
History and Physical    Stanislaus Kaltenbach YWV:[id number] DOB: [birth year] DOA: 11/24/2018  PCP: [name], MD   Patient coming from: Home   Chief Complaint: Confusion, word-finding difficulty   HPI: [name] is a 85 y.o. male with medical history significant for coronary artery disease, ischemic cardiomyopathy, insulin-dependent diabetes mellitus, chronic kidney disease stage III-IV, hypothyroidism, and OSA with CPAP intolerance, now presenting to the emergency department for evaluation of confusion and word finding difficulty.  Patient had been out shopping and having an uneventful evening but began to feel confused when arriving back home.  He did not feel particularly lightheaded and denied any focal numbness or weakness, but was having difficulty reading words or seeing the time on a clock.  He was having difficulty finding words to use during this episode as well and symptoms eventually resolved on their own.  He has had episodes of lightheadedness and near syncope while standing for over a month now, was noted to be orthostatic by his cardiologist, his ARB was stopped, his diuretic and potassium was held, and it was recommended that he wear compression stockings and possibly abdominal binder for this.  No recent fevers, chills, cough, or shortness of breath.  ED Course: Upon arrival to the ED, patient is found to be afebrile, saturating well on room air, and with remaining vitals also normal.  EKG features sinus rhythm with PVC, nonspecific IVCD, and LVH. Noncontrast head CT is negative for acute intracranial abnormality.  Chemistry panel is notable for creatinine 1.91 which appears consistent with his baseline.  CBC is notable for slight thrombocytopenia.  Neurology was consulted by the ED physician and recommends medical admission for further evaluation and management.   Review of Systems:  All other systems reviewed and apart from HPI, are negative.  Past Medical History:  Diagnosis  Date  . Abnormal Doppler ultrasound of carotid artery 12/16/2012   mildly abnormal doppler . no prior studies to compare to. follow up studies are recommended when clinically indicated.  Marland Kitchen Blepharitis   . Cancer ([place])   . Diabetes mellitus without complication ([place])   . Exogenous obesity   . Gastric ulcer   . GI bleed   . History of stress test 06/06/2012   low risk scan. fixed inferior defect consistant with prior infarction with moderatly reduced EF. no appreciable evidence of ischemia. no significant change from previous study  . Hx of echocardiogram 08-15-2012   EF  45-50 % very difficult study, severe LVH, pacemaker, trace TR,mildly enlarged RV,; aorta mildly calcified  . Pacemaker 2011   [employer] Accent DDDR ; this is the third generator this pt. has had  . Shingles   . UTI (lower urinary tract infection)     Past Surgical History:  Procedure Laterality Date  . CARDIAC CATHETERIZATION  12-29-2003   rt. coronary angiography was done with a 6 french 4 cm taper coronary cath. This demonstrated  approximately 70 to 75% mildly segmental stenosis in the mid RCA before the acute margin          . CARDIAC CATHETERIZATION  11-19-2003   diagnostic cath,, pt. to get second cath to have stent placed  . cardiac stents    . CATARACT EXTRACTION    . KNEE SURGERY Right   . PROSTATE BIOPSY    . prostate seed implants    . SPINE SURGERY    . WISDOM TOOTH EXTRACTION       reports that he has never smoked. He has quit  using smokeless tobacco. He reports current alcohol use. He reports that he does not use drugs.  No Known Allergies  History reviewed. No pertinent family history.   Prior to Admission medications   Medication Sig Start Date End Date Taking? Authorizing Provider  aspirin 81 MG tablet Take 81 mg by mouth daily.    [provider]  Aspirin-Calcium Carbonate 81-777 MG TABS Take by mouth.    [provider]  betamethasone acetate-betamethasone sodium phosphate  (CELESTONE) 6 (3-3) MG/ML injection Inject into the muscle. 07/23/16   [provider]  bisacodyl (DULCOLAX) 5 MG EC tablet Take 5 mg by mouth daily as needed for moderate constipation.    [provider]  calcitRIOL (ROCALTROL) 0.25 MCG capsule Take 0.25 mcg by mouth every Monday, Wednesday, and Friday.    [provider]  Cholecalciferol (VITAMIN D-1000 MAX ST) 25 MCG (1000 UT) tablet Take by mouth.    [provider]  clopidogrel (PLAVIX) 75 MG tablet TAKE 1 TABLET BY MOUTH ONCE DAILY 04/02/18   [provider]  Coenzyme Q10 (COQ10) 150 MG CAPS Take by mouth.    [provider]  insulin NPH-regular Human (70-30) 100 UNIT/ML injection Inject 65 Units into the skin 2 (two) times daily with a meal.     [provider]  levothyroxine (SYNTHROID, LEVOTHROID) 25 MCG tablet Take by mouth. 02/09/14   [provider]  metoprolol succinate (TOPROL-XL) 25 MG 24 hr tablet TAKE 1 & 1/2 (ONE & ONE-HALF) TABLETS BY MOUTH DAILY 11/08/18   Croitoru, Mihai, MD  nitroGLYCERIN (NITROSTAT) 0.4 MG SL tablet Place under the tongue. 06/27/17   [provider]  omega-3 acid ethyl esters (LOVAZA) 1 g capsule Take by mouth.    [provider]  Omega-3 Fatty Acids (FISH OIL) 1000 MG CAPS Take 2,400 mg by mouth daily.     [provider]  pantoprazole (PROTONIX) 40 MG tablet TAKE 1 TABLET BY MOUTH ONCE DAILY 04/02/18   [provider]  polyethylene glycol (MIRALAX / GLYCOLAX) packet Take by mouth.    [provider]  rosuvastatin (CRESTOR) 40 MG tablet TAKE 1 TABLET BY MOUTH ONCE DAILY 03/25/18   [provider]  vitamin C (ASCORBIC ACID) 500 MG tablet Take 500 mg by mouth daily.    [provider]  vitamin E 400 UNIT capsule Take 400 Units by mouth daily.    [provider]    Physical Exam: Vitals:   11/24/18 1742 11/24/18 1744 11/24/18 1745 11/24/18 1900  BP: (!) 165/82  (!) 165/82    Pulse: 88  92   Resp: 16  18 20   Temp: 98.6 F (37 C)     TempSrc: Oral     SpO2: 95%  96%   Weight:  117 kg      Constitutional: NAD, calm  Eyes: PERTLA, lids and conjunctivae normal ENMT: Mucous membranes are moist. Posterior pharynx clear of any exudate or lesions.   Neck: normal, supple, no masses, no thyromegaly Respiratory: no wheezing, no crackles. No accessory muscle use.  Cardiovascular: S1 & S2 heard, regular rate and rhythm. Pretibial pitting edema bilaterally. Abdomen: No distension, no tenderness, soft. Bowel sounds active.  Musculoskeletal: no clubbing / cyanosis. No joint deformity upper and lower extremities.   Skin: no significant rashes, lesions, ulcers. Warm, dry, well-perfused. Neurologic: CN 2-12 grossly intact. Sensation intact. Strength 5/5 in all 4 limbs.  Psychiatric: Alert and oriented to person, place, and situation. Pleasant, cooperative.  Labs on Admission: I have personally reviewed following labs and imaging studies  CBC: Recent Labs  Lab 11/24/18 1754 11/24/18 1802  WBC 8.6  --   NEUTROABS 5.8  --   HGB 16.2 16.7  HCT 50.7 49.0  MCV 94.4  --   PLT 147*  --    Basic Metabolic Panel: Recent Labs  Lab 11/24/18 1754 11/24/18 1802  NA 139 139  K 4.5 4.5  CL 106 106  CO2 23  --   GLUCOSE 136* 136*  BUN 35* 39*  CREATININE 1.91* 1.90*  CALCIUM 9.1  --    GFR: Estimated Creatinine Clearance: 35.1 mL/min (A) (by C-G formula based on SCr of 1.9 mg/dL (H)). Liver Function Tests: Recent Labs  Lab 11/24/18 1754  AST 21  ALT 22  ALKPHOS 45  BILITOT 0.7  PROT 6.9  ALBUMIN 3.7   No results for input(s): LIPASE, AMYLASE in the last 168 hours. No results for input(s): AMMONIA in the last 168 hours. Coagulation Profile: Recent Labs  Lab 11/24/18 1754  INR 1.1   Cardiac Enzymes: No results for input(s): CKTOTAL, CKMB, CKMBINDEX, TROPONINI in the last 168 hours. BNP (last 3 results) No results for input(s): PROBNP in the last  8760 hours. HbA1C: No results for input(s): HGBA1C in the last 72 hours. CBG: No results for input(s): GLUCAP in the last 168 hours. Lipid Profile: No results for input(s): CHOL, HDL, LDLCALC, TRIG, CHOLHDL, LDLDIRECT in the last 72 hours. Thyroid Function Tests: No results for input(s): TSH, T4TOTAL, FREET4, T3FREE, THYROIDAB in the last 72 hours. Anemia Panel: No results for input(s): VITAMINB12, FOLATE, FERRITIN, TIBC, IRON, RETICCTPCT in the last 72 hours. Urine analysis:    Component Value Date/Time   COLORURINE YELLOW 06/29/2010 0930   APPEARANCEUR CLEAR 06/29/2010 0930   LABSPEC 1.010 06/29/2010 0930   PHURINE 6.5 06/29/2010 0930   HGBUR NEGATIVE 06/29/2010 0930   BILIRUBINUR NEGATIVE 06/29/2010 0930   KETONESUR NEGATIVE 06/29/2010 0930   PROTEINUR NEGATIVE 06/29/2010 0930   UROBILINOGEN 0.2 06/29/2010 0930   NITRITE NEGATIVE 06/29/2010 0930   LEUKOCYTESUR  06/29/2010 0930    NEGATIVE MICROSCOPIC NOT DONE ON URINES WITH NEGATIVE PROTEIN, BLOOD, LEUKOCYTES, NITRITE, OR GLUCOSE <1000 mg/dL.   Sepsis Labs: @LABRCNTIP (procalcitonin:4,lacticidven:4) )No results found for this or any previous visit (from the past 240 hour(s)).   Radiological Exams on Admission: Ct Head Wo Contrast  Result Date: 11/24/2018 CLINICAL DATA:  Possible TIA.  Mental status changes for 30 minutes. EXAM: CT HEAD WITHOUT CONTRAST TECHNIQUE: Contiguous axial images were obtained from the base of the skull through the vertex without intravenous contrast. COMPARISON:  None. FINDINGS: Brain: Cerebral and cerebellar atrophy are felt to be within normal variation for age. Moderate low density in the periventricular white matter likely related to small vessel disease. No mass lesion, hemorrhage, hydrocephalus, acute infarct, intra-axial, or extra-axial fluid collection. Vascular: No hyperdense vessel or unexpected calcification. Skull: Normal Sinuses/Orbits: Bilateral cataracts extractions. Mucosal thickening of the  right maxillary sinus. Other: None. IMPRESSION: 1.  No acute intracranial abnormality. 2.  Cerebral atrophy and small vessel ischemic change. Electronically Signed   By: Abigail Miyamoto M.D.   On: 11/24/2018 20:16    EKG: Independently reviewed. Sinus rhythm, PVC, non-specific IVCD, LVH.   Assessment/Plan   1. Transient neurologic deficits  - Presents following an episode of confusion and word-finding difficulty, resolved prior to admission  - Head CT is negative for acute findings, no focal deficits on exam  - Neurology consulting  and much appreciated  - MRI is precluded by his pacer  - Continue neuro checks, PT/OT/SLP eval  - Check CTA head and neck, repeat non-contrast CT head tomorrow, echocardiogram, fasting lipids, and A1c  - Continue dual antiplatelets and Crestor    2. Sinus node dysfunction  - He has PPM, will interrogate as above   3. Insulin-dependent DM  - No A1c on file, will check in light of TIA sxs  - Treated with insulin at home, will use Lantus with Novolog correctional while in hospital    4. CAD  - No anginal complaints  - Continue ASA, Plavix, and statin    ADDENDUM: patient complained of acute-onset chest pressure while at rest in ED. Vitals are stable, he appears comfortable. CXR already ordered, will check EKG now, give ASA, check enzymes.    5. Chronic combined systolic & diastolic CHF  - EF 38-38% in 2014  - Appears hypervolemic in ED but does not appear dyspneic and is speaking full sentances - He reports holding his diuretic for the past month d/t orthostasis  - He is being gently hydrated given his CKD and plan for CTA, will need to watch for complications of this, follow daily wt and I/O's  - Echo is being updated as above   6. CKD stage III-IV  - SCr is 1.91 on admission, appears to be his baseline  - Renally-dose medications, CTA recommended by neurology and he will be cautiously hydrated with IVF    7. Hypothyroidism  - Continue Synthroid      PPE: mask, face shield  DVT prophylaxis: sq heparin  Code Status: Full  Family Communication: Discussed with patient  Consults called: Neurology  Admission status: Observation     Vianne Bulls, MD [facility] Pager [phone]  If 7PM-7AM, please contact night-coverage [email or web address] Password [facility]  11/24/2018, 8:32 PM

## 2018-11-24 NOTE — ED Provider Notes (Signed)
[facility] EMERGENCY DEPARTMENT Provider Note   CSN: [id number] Arrival date & time: 11/24/18  1722    History   Chief Complaint Chief Complaint  Patient presents with  . Transient Ischemic Attack    HPI Fredric Slabach is a 85 y.o. male.     HPI  85 year old male with history of diabetes, hypertension, hyperlipidemia, prior abnormal ultrasound of the carotid arteries, CAD, CHF, pacemaker in place, obesity, presents with concern for episode of difficulty speaking and recognizing symbols.  At approximately 4:45 PM today, patient reports that he was sitting at the computer, and was unable to recognize the computer keys.  Reports that he looked at the clock and was unable to see the clock hands, read the clock.  He does not think it was a visual problem but have a problem comprehending the symbols.  Reports that it seemed as if the symbols he were looking at were in another language.  He also reports he had difficulty speaking, difficulty finding the words he was looking to say.  He denied facial droop, focal numbness, weakness, change in vision.  He has never had an incident like this before.  No history of stroke.  Reports that he is back to baseline now, but has to think things through more than before.   Past Medical History:  Diagnosis Date  . Abnormal Doppler ultrasound of carotid artery 12/16/2012   mildly abnormal doppler . no prior studies to compare to. follow up studies are recommended when clinically indicated.  Marland Kitchen Blepharitis   . Cancer ([place])   . Diabetes mellitus without complication ([place])   . Exogenous obesity   . Gastric ulcer   . GI bleed   . History of stress test 06/06/2012   low risk scan. fixed inferior defect consistant with prior infarction with moderatly reduced EF. no appreciable evidence of ischemia. no significant change from previous study  . Hx of echocardiogram 08-15-2012   EF  45-50 % very difficult study, severe LVH, pacemaker, trace  TR,mildly enlarged RV,; aorta mildly calcified  . Pacemaker 2011   St. Jude Accent DDDR ; this is the third generator this pt. has had  . Shingles   . UTI (lower urinary tract infection)     Patient Active Problem List   Diagnosis Date Noted  . Chest pain 11/25/2018  . Elevated troponin 11/25/2018  . Transient neurological symptoms 11/24/2018  . Hypothyroidism 11/24/2018  . Pain due to onychomycosis of toenails of both feet 11/13/2018  . Morbid obesity ([place]) 02/18/2016  . Chronic combined systolic and diastolic heart failure ([employer]) 02/19/2015  . Sinus node dysfunction 03/09/2014  . Second degree AV block 03/09/2014  . Pacemaker - dual chamber St. Jude RF 07/24/2013  . Cardiomyopathy, ischemic 07/24/2013  . CAD (coronary artery disease) 07/24/2013  . OSA (obstructive sleep apnea) 07/24/2013  . CKD (chronic kidney disease) stage 3, GFR 30-59 ml/min ([facility]) 07/24/2013  . DM type 2 causing CKD stage 3 ([place]) 07/24/2013  . Mixed hyperlipidemia 07/24/2013    Past Surgical History:  Procedure Laterality Date  . CARDIAC CATHETERIZATION  12-29-2003   rt. coronary angiography was done with a 6 french 4 cm taper coronary cath. This demonstrated  approximately 70 to 75% mildly segmental stenosis in the mid RCA before the acute margin          . CARDIAC CATHETERIZATION  11-19-2003   diagnostic cath,, pt. to get second cath to have stent placed  . cardiac stents    .  CATARACT EXTRACTION    . KNEE SURGERY Right   . PROSTATE BIOPSY    . prostate seed implants    . SPINE SURGERY    . WISDOM TOOTH EXTRACTION          Home Medications    Prior to Admission medications   Medication Sig Start Date End Date Taking? Authorizing Provider  acetaminophen (TYLENOL) 500 MG tablet Take 1,000 mg by mouth daily as needed for mild pain.   Yes [provider]  aspirin 81 MG tablet Take 81 mg by mouth daily.   Yes [provider]  calcitRIOL (ROCALTROL) 0.25 MCG capsule Take 0.25 mcg by  mouth every Monday, Wednesday, and Friday.   Yes [provider]  Cholecalciferol (VITAMIN D-1000 MAX ST) 25 MCG (1000 UT) tablet Take 2,000 Units by mouth daily.    Yes [provider]  clopidogrel (PLAVIX) 75 MG tablet Take 75 mg by mouth daily.  04/02/18  Yes [provider]  Coenzyme Q10 (COQ10) 150 MG CAPS Take 1 tablet by mouth daily.    Yes [provider]  docusate sodium (COLACE) 100 MG capsule Take 200 mg by mouth daily.   Yes [provider]  insulin NPH-regular Human (70-30) 100 UNIT/ML injection Inject 65 Units into the skin 2 (two) times daily with a meal.    Yes [provider]  levothyroxine (SYNTHROID, LEVOTHROID) 25 MCG tablet Take 25 mcg by mouth daily.  02/09/14  Yes [provider]  metoprolol succinate (TOPROL-XL) 25 MG 24 hr tablet TAKE 1 & 1/2 (ONE & ONE-HALF) TABLETS BY MOUTH DAILY Patient taking differently: Take 37.5 mg by mouth daily.  11/08/18  Yes Croitoru, Mihai, MD  nitroGLYCERIN (NITROSTAT) 0.4 MG SL tablet Place under the tongue. 06/27/17  Yes [provider]  Omega-3 Fatty Acids (FISH OIL) 1000 MG CAPS Take 2,400 mg by mouth daily.    Yes [provider]  pantoprazole (PROTONIX) 40 MG tablet Take 40 mg by mouth daily.  04/02/18  Yes [provider]  rosuvastatin (CRESTOR) 40 MG tablet Take 40 mg by mouth daily.  03/25/18  Yes [provider]  vitamin C (ASCORBIC ACID) 500 MG tablet Take 500 mg by mouth daily.   Yes [provider]  vitamin E 400 UNIT capsule Take 400 Units by mouth daily.   Yes [provider]    Family History History reviewed. No pertinent family history.  Social History Social History   Tobacco Use  . Smoking status: Never Smoker  . Smokeless tobacco: Former Network engineer Use Topics  . Alcohol use: Yes    Comment: daily wine  . Drug use: No     Allergies   Patient has no known allergies.   Review of Systems  Review of Systems  Constitutional: Negative for fever.  HENT: Negative for sore throat.   Eyes: Negative for visual disturbance.  Respiratory: Negative for shortness of breath.   Cardiovascular: Negative for chest pain.  Gastrointestinal: Negative for abdominal pain, nausea and vomiting.  Genitourinary: Negative for difficulty urinating.  Musculoskeletal: Negative for back pain and neck stiffness.  Skin: Negative for rash.  Neurological: Positive for speech difficulty. Negative for seizures, syncope, facial asymmetry, weakness, light-headedness, numbness and headaches.     Physical Exam Updated Vital Signs BP 120/67 (BP Location: Left Arm)   Pulse 83   Temp 97.8 F (36.6 C) (Oral)   Resp 20   Wt 117 kg   SpO2 (!) 86% Comment:  while walking with PT  BMI 41.02 kg/m   Physical Exam Vitals signs and nursing note reviewed.  Constitutional:      General: He is not in acute distress.    Appearance: He is well-developed. He is not diaphoretic.  HENT:     Head: Normocephalic and atraumatic.  Eyes:     General: No visual field deficit.    Conjunctiva/sclera: Conjunctivae normal.  Neck:     Musculoskeletal: Normal range of motion.  Cardiovascular:     Rate and Rhythm: Normal rate and regular rhythm.     Heart sounds: Normal heart sounds. No murmur. No friction rub. No gallop.   Pulmonary:     Effort: Pulmonary effort is normal. No respiratory distress.     Breath sounds: Normal breath sounds. No wheezing or rales.  Abdominal:     General: There is no distension.     Palpations: Abdomen is soft.     Tenderness: There is no abdominal tenderness. There is no guarding.  Skin:    General: Skin is warm and dry.  Neurological:     Mental Status: He is alert and oriented to person, place, and time.     GCS: GCS eye subscore is 4. GCS verbal subscore is 5. GCS motor subscore is 6.     Cranial Nerves: Cranial nerves are intact. No cranial nerve deficit, dysarthria or facial  asymmetry.     Sensory: Sensation is intact. No sensory deficit.     Motor: Motor function is intact. No weakness.     Coordination: Coordination is intact.      ED Treatments / Results  Labs (all labs ordered are listed, but only abnormal results are displayed) Labs Reviewed  CBC - Abnormal; Notable for the following components:      Result Value   Platelets 147 (*)    All other components within normal limits  COMPREHENSIVE METABOLIC PANEL - Abnormal; Notable for the following components:   Glucose, Bld 136 (*)    BUN 35 (*)    Creatinine, Ser 1.91 (*)    GFR calc non Af Amer 31 (*)    GFR calc Af Amer 36 (*)    All other components within normal limits  TROPONIN I (HIGH SENSITIVITY) - Abnormal; Notable for the following components:   Troponin I (High Sensitivity) 20 (*)    All other components within normal limits  TROPONIN I (HIGH SENSITIVITY) - Abnormal; Notable for the following components:   Troponin I (High Sensitivity) 43 (*)    All other components within normal limits  HEMOGLOBIN A1C - Abnormal; Notable for the following components:   Hgb A1c MFr Bld 7.0 (*)    All other components within normal limits  LIPID PANEL - Abnormal; Notable for the following components:   Triglycerides 236 (*)    HDL 35 (*)    VLDL 47 (*)    All other components within normal limits  BASIC METABOLIC PANEL - Abnormal; Notable for the following components:   Glucose, Bld 161 (*)    BUN 28 (*)    Creatinine, Ser 1.70 (*)    GFR calc non Af Amer 36 (*)    GFR calc Af Amer 42 (*)    All other components within normal limits  GLUCOSE, CAPILLARY - Abnormal; Notable for the following components:   Glucose-Capillary 161 (*)    All other components within normal limits  I-STAT CHEM 8, ED - Abnormal; Notable for the following components:   BUN  39 (*)    Creatinine, Ser 1.90 (*)    Glucose, Bld 136 (*)    Calcium, Ion 1.03 (*)    All other components within normal limits  CBG MONITORING,  ED - Abnormal; Notable for the following components:   Glucose-Capillary 106 (*)    All other components within normal limits  SARS CORONAVIRUS 2 (HOSPITAL ORDER, [facility])  PROTIME-INR  APTT  DIFFERENTIAL  GLUCOSE, CAPILLARY    EKG EKG Interpretation  Date/Time:  Sunday November 24 2018 18:01:23 EDT Ventricular Rate:  82 PR Interval:    QRS Duration: 191 QT Interval:  444 QTC Calculation: 519 R Axis:   -80 Text Interpretation:  Sinus rhythm Ventricular premature complex Nonspecific IVCD with LAD Left ventricular hypertrophy No significant change since last tracing Confirmed by Gareth Morgan [id number]) on 11/24/2018 6:08:38 PM   Radiology Ct Angio Head W Or Wo Contrast  Result Date: 11/24/2018 CLINICAL DATA:  85 y/o  M; word-finding difficulty and confusion. EXAM: CT ANGIOGRAPHY HEAD AND NECK TECHNIQUE: Multidetector CT imaging of the head and neck was performed using the standard protocol during bolus administration of intravenous contrast. Multiplanar CT image reconstructions and MIPs were obtained to evaluate the vascular anatomy. Carotid stenosis measurements (when applicable) are obtained utilizing NASCET criteria, using the distal internal carotid diameter as the denominator. CONTRAST:  58mL OMNIPAQUE IOHEXOL 350 MG/ML SOLN COMPARISON:  11/24/2018 CT head. FINDINGS: CTA NECK FINDINGS Aortic arch: Bovine variant branching. Imaged portion shows no evidence of aneurysm or dissection. No significant stenosis of the major arch vessel origins. Mild calcific atherosclerosis. Right carotid system: No evidence of dissection, stenosis (50% or greater) or occlusion. Non stenotic calcific atherosclerosis of carotid bifurcation. Left carotid system: Moderate 60% proximal left ICA stenosis with calcified plaque. No additional evidence for stenosis, dissection, or occlusion of left carotid system. Vertebral arteries: Right dominant. No evidence of dissection, stenosis (50% or  greater) or occlusion. Skeleton: No acute finding. Multilevel flowing anterior ossification compatible with DISH. Other neck: 8 mm nodule in the right lobe of the thyroid gland. Upper chest: Negative. Review of the MIP images confirms the above findings CTA HEAD FINDINGS Anterior circulation: No significant stenosis, proximal occlusion, aneurysm, or vascular malformation. Non stenotic calcific atherosclerosis of internal carotid arteries. Posterior circulation: No significant stenosis, proximal occlusion, aneurysm, or vascular malformation. Venous sinuses: As permitted by contrast timing, patent. Anatomic variants: None significant. Review of the MIP images confirms the above findings IMPRESSION: 1. Patent carotid and vertebral arteries. 2. Moderate 60% proximal left ICA stenosis with calcified plaque. No additional findings for dissection, aneurysm, or hemodynamically significant stenosis utilizing NASCET criteria. 3. Patent Circle of Willis. No intracranial large vessel occlusion, aneurysm, or significant stenosis. Electronically Signed   By: Kristine Garbe M.D.   On: 11/24/2018 22:42   Ct Head Wo Contrast  Result Date: 11/24/2018 CLINICAL DATA:  Possible TIA.  Mental status changes for 30 minutes. EXAM: CT HEAD WITHOUT CONTRAST TECHNIQUE: Contiguous axial images were obtained from the base of the skull through the vertex without intravenous contrast. COMPARISON:  None. FINDINGS: Brain: Cerebral and cerebellar atrophy are felt to be within normal variation for age. Moderate low density in the periventricular white matter likely related to small vessel disease. No mass lesion, hemorrhage, hydrocephalus, acute infarct, intra-axial, or extra-axial fluid collection. Vascular: No hyperdense vessel or unexpected calcification. Skull: Normal Sinuses/Orbits: Bilateral cataracts extractions. Mucosal thickening of the right maxillary sinus. Other: None. IMPRESSION: 1.  No acute intracranial abnormality.  2.   Cerebral atrophy and small vessel ischemic change. Electronically Signed   By: Abigail Miyamoto M.D.   On: 11/24/2018 20:16   Ct Angio Neck W Or Wo Contrast  Result Date: 11/24/2018 CLINICAL DATA:  85 y/o  M; word-finding difficulty and confusion. EXAM: CT ANGIOGRAPHY HEAD AND NECK TECHNIQUE: Multidetector CT imaging of the head and neck was performed using the standard protocol during bolus administration of intravenous contrast. Multiplanar CT image reconstructions and MIPs were obtained to evaluate the vascular anatomy. Carotid stenosis measurements (when applicable) are obtained utilizing NASCET criteria, using the distal internal carotid diameter as the denominator. CONTRAST:  42mL OMNIPAQUE IOHEXOL 350 MG/ML SOLN COMPARISON:  11/24/2018 CT head. FINDINGS: CTA NECK FINDINGS Aortic arch: Bovine variant branching. Imaged portion shows no evidence of aneurysm or dissection. No significant stenosis of the major arch vessel origins. Mild calcific atherosclerosis. Right carotid system: No evidence of dissection, stenosis (50% or greater) or occlusion. Non stenotic calcific atherosclerosis of carotid bifurcation. Left carotid system: Moderate 60% proximal left ICA stenosis with calcified plaque. No additional evidence for stenosis, dissection, or occlusion of left carotid system. Vertebral arteries: Right dominant. No evidence of dissection, stenosis (50% or greater) or occlusion. Skeleton: No acute finding. Multilevel flowing anterior ossification compatible with DISH. Other neck: 8 mm nodule in the right lobe of the thyroid gland. Upper chest: Negative. Review of the MIP images confirms the above findings CTA HEAD FINDINGS Anterior circulation: No significant stenosis, proximal occlusion, aneurysm, or vascular malformation. Non stenotic calcific atherosclerosis of internal carotid arteries. Posterior circulation: No significant stenosis, proximal occlusion, aneurysm, or vascular malformation. Venous sinuses: As  permitted by contrast timing, patent. Anatomic variants: None significant. Review of the MIP images confirms the above findings IMPRESSION: 1. Patent carotid and vertebral arteries. 2. Moderate 60% proximal left ICA stenosis with calcified plaque. No additional findings for dissection, aneurysm, or hemodynamically significant stenosis utilizing NASCET criteria. 3. Patent Circle of Willis. No intracranial large vessel occlusion, aneurysm, or significant stenosis. Electronically Signed   By: Kristine Garbe M.D.   On: 11/24/2018 22:42   Dg Chest Port 1 View  Result Date: 11/24/2018 CLINICAL DATA:  85 year old male with acute onset visual changes around 1645 hours. Shortness of breath. EXAM: PORTABLE CHEST 1 VIEW COMPARISON:  Chest radiographs 03/31/2010 and earlier. FINDINGS: Portable AP upright view at 2107 hours. Similar low lung volumes and cardiomegaly. Chronic right chest cardiac pacemaker. Visualized tracheal air column is within normal limits. Allowing for portable technique the lungs are clear. No pneumothorax, pleural effusion or confluent pulmonary opacity. Paucity of bowel gas in the upper abdomen. No acute osseous abnormality identified. IMPRESSION: Low lung volumes.  No acute cardiopulmonary abnormality. Electronically Signed   By: Genevie Ann M.D.   On: 11/24/2018 21:16    Procedures Procedures (including critical care time)  Medications Ordered in ED Medications  sodium chloride flush (NS) 0.9 % injection 3 mL (3 mLs Intravenous Not Given 11/24/18 1917)  aspirin EC tablet 81 mg (81 mg Oral Given 11/25/18 1011)  rosuvastatin (CRESTOR) tablet 40 mg (has no administration in time range)  levothyroxine (SYNTHROID) tablet 25 mcg (25 mcg Oral Given 11/25/18 0609)  pantoprazole (PROTONIX) EC tablet 40 mg (40 mg Oral Given 11/25/18 1010)  clopidogrel (PLAVIX) tablet 75 mg (75 mg Oral Given 11/25/18 1011)  omega-3 acid ethyl esters (LOVAZA) capsule 2 g (2 g Oral Given 11/25/18 1010)  0.9 %  sodium  chloride infusion ( Intravenous Stopped 11/25/18 0920)  acetaminophen (TYLENOL) tablet  650 mg (has no administration in time range)    Or  acetaminophen (TYLENOL) solution 650 mg (has no administration in time range)    Or  acetaminophen (TYLENOL) suppository 650 mg (has no administration in time range)  senna-docusate (Senokot-S) tablet 1 tablet (has no administration in time range)  heparin injection 5,000 Units (5,000 Units Subcutaneous Given 11/25/18 1302)  insulin detemir (LEVEMIR) injection 40 Units (40 Units Subcutaneous Given 11/25/18 1011)  insulin aspart (novoLOG) injection 0-9 Units (2 Units Subcutaneous Given 11/25/18 1245)  insulin aspart (novoLOG) injection 0-5 Units (0 Units Subcutaneous Hold 11/24/18 2224)   stroke: mapping our early stages of recovery book ( Does not apply Given 11/25/18 1144)  aspirin chewable tablet 162 mg (162 mg Oral Given 11/24/18 2129)  iohexol (OMNIPAQUE) 350 MG/ML injection 50 mL (50 mLs Intravenous Contrast Given 11/24/18 2228)     Initial Impression / Assessment and Plan / ED Course  I have reviewed the triage vital signs and the nursing notes.  Pertinent labs & imaging results that were available during my care of the patient were reviewed by me and considered in my medical decision making (see chart for details).        85 year old male with history of diabetes, hypertension, hyperlipidemia, prior abnormal ultrasound of the carotid arteries, CAD, CHF, pacemaker in place, obesity, presents with concern for episode of difficulty speaking and recognizing symbols.   Patient has a normal neurologic exam at this time, denies continuing symptoms.  EKG is unchanged from prior.  Labs without significant change.  History is concerning for TIA.  Consulted neurology and hospitalist for admission.   Final Clinical Impressions(s) / ED Diagnoses   Final diagnoses:  TIA (transient ischemic attack)    ED Discharge Orders    None       Gareth Morgan, MD 11/25/18  1547

## 2018-11-24 NOTE — ED Notes (Signed)
CT contacted RN about IV. IV team placed 22G earlier. This RN tried twice for IV. IV team consulted again.

## 2018-11-24 NOTE — Consult Note (Signed)
Neurology Consultation  Reason for Consult: Stroke, TIA Referring Physician: Dr. Billy Fischer  CC: Word finding difficulty and confusion  History is obtained from: Patient, chart  HPI: [name] is a 85 y.o. 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 He reports that he was driving back from shopping with his wife this evening when he started feeling confused.  He denied any lightheadedness or focal tingling numbness or weakness associated with it but he could not really read the letters on the screen on his computer.  He also could not see a part of the wall clock-it was around 6:00 PM and he could not see the lower half-the hour hand of the clock.  The symptoms lasted for a brief amount of time before resolving.  During that time, he also said that he felt that he could not really bring out the words that he wanted to though he did not try to talk because nobody was around him at the time.  The family then came in, checked on him and brought him to the hospital for concern for strokes. He has not had a stroke in the past.  He says that over the past few years he has been having more difficulty with walking due to increasing shortness of breath.  He also said that over the past few days and more so last night he was feeling unwell with some chills and lightheadedness. He has established follow-up with the cardiology service-Dr. Sallyanne Kuster. He is compliant to medications He is a non-smoker He drinks 1 drink of [place] every night. No drugs Retired Arboriculturist, keeps himself busy with hobby photography.   LKW: 5 PM tpa given?: no, complete resolution of symptoms Premorbid modified Rankin scale (mRS): *1 ROS: ROS was performed and is negative except as noted in the HPI.    Past Medical History:  Diagnosis Date  . Abnormal Doppler  ultrasound of carotid artery 12/16/2012   mildly abnormal doppler . no prior studies to compare to. follow up studies are recommended when clinically indicated.  Marland Kitchen Blepharitis   . Cancer ([place])   . Diabetes mellitus without complication ([place])   . Exogenous obesity   . Gastric ulcer   . GI bleed   . History of stress test 06/06/2012   low risk scan. fixed inferior defect consistant with prior infarction with moderatly reduced EF. no appreciable evidence of ischemia. no significant change from previous study  . Hx of echocardiogram 08-15-2012   EF  45-50 % very difficult study, severe LVH, pacemaker, trace TR,mildly enlarged RV,; aorta mildly calcified  . Pacemaker 2011   St. Jude Accent DDDR ; this is the third generator this pt. has had  . Shingles   . UTI (lower urinary tract infection)    No family history on file. No family history of strokes  Social History:   reports that he has never smoked. He has quit using smokeless tobacco. He reports current alcohol use. He reports that he does not use drugs.  Medications  Current Facility-Administered Medications:  .  betamethasone acetate-betamethasone sodium phosphate (CELESTONE) injection 3 mg, 3 mg, Intramuscular, Once, Evans, Brent M, DPM .  sodium chloride flush (NS) 0.9 % injection 3 mL, 3 mL, Intravenous, Once, Gareth Morgan, MD  Current Outpatient Medications:  .  aspirin 81 MG tablet, Take 81 mg by mouth daily., Disp: , Rfl:  .  Aspirin-Calcium Carbonate 81-777 MG TABS, Take by mouth., Disp: , Rfl:  .  betamethasone acetate-betamethasone sodium phosphate (CELESTONE) 6 (3-3) MG/ML injection, Inject into the muscle., Disp: , Rfl:  .  bisacodyl (DULCOLAX) 5 MG EC tablet, Take 5 mg by mouth daily as needed for moderate constipation., Disp: , Rfl:  .  calcitRIOL (ROCALTROL) 0.25 MCG capsule, Take 0.25 mcg by mouth every Monday, Wednesday, and Friday., Disp: , Rfl:  .  Cholecalciferol (VITAMIN D-1000 MAX ST) 25 MCG (1000 UT) tablet,  Take by mouth., Disp: , Rfl:  .  clopidogrel (PLAVIX) 75 MG tablet, TAKE 1 TABLET BY MOUTH ONCE DAILY, Disp: , Rfl:  .  Coenzyme Q10 (COQ10) 150 MG CAPS, Take by mouth., Disp: , Rfl:  .  insulin NPH-regular Human (70-30) 100 UNIT/ML injection, Inject 65 Units into the skin 2 (two) times daily with a meal. , Disp: , Rfl:  .  levothyroxine (SYNTHROID, LEVOTHROID) 25 MCG tablet, Take by mouth., Disp: , Rfl:  .  metoprolol succinate (TOPROL-XL) 25 MG 24 hr tablet, TAKE 1 & 1/2 (ONE & ONE-HALF) TABLETS BY MOUTH DAILY, Disp: 135 tablet, Rfl: 2 .  nitroGLYCERIN (NITROSTAT) 0.4 MG SL tablet, Place under the tongue., Disp: , Rfl:  .  omega-3 acid ethyl esters (LOVAZA) 1 g capsule, Take by mouth., Disp: , Rfl:  .  Omega-3 Fatty Acids (FISH OIL) 1000 MG CAPS, Take 2,400 mg by mouth daily. , Disp: , Rfl:  .  pantoprazole (PROTONIX) 40 MG tablet, TAKE 1 TABLET BY MOUTH ONCE DAILY, Disp: , Rfl:  .  polyethylene glycol (MIRALAX / GLYCOLAX) packet, Take by mouth., Disp: , Rfl:  .  rosuvastatin (CRESTOR) 40 MG tablet, TAKE 1 TABLET BY MOUTH ONCE DAILY, Disp: , Rfl:  .  vitamin C (ASCORBIC ACID) 500 MG tablet, Take 500 mg by mouth daily., Disp: , Rfl:  .  vitamin E 400 UNIT capsule, Take 400 Units by mouth daily., Disp: , Rfl:   Exam: Current vital signs: BP (!) 165/82   Pulse 92   Temp 98.6 F (37 C) (Oral)   Resp 20   Wt 117 kg   SpO2 96%   BMI 41.02 kg/m  Vital signs in last 24 hours: Temp:  [98.6 F (37 C)] 98.6 F (37 C) (07/05 1742) Pulse Rate:  [88-92] 92 (07/05 1745) Resp:  [16-20] 20 (07/05 1900) BP: (165)/(82) 165/82 (07/05 1745) SpO2:  [95 %-96 %] 96 % (07/05 1745) Weight:  [366 kg] 117 kg (07/05 1744) GENERAL: Awake, alert in NAD HEENT: - Normocephalic and atraumatic, dry mm, no LN++, no Thyromegally LUNGS - Clear to auscultation bilaterally with no wheezes CV - S1S2 RRR, no m/r/g, equal pulses bilaterally. ABDOMEN - Soft, nontender, nondistended with normoactive BS Ext: warm, well  perfused, intact peripheral pulses,no edema  NEURO:  Mental Status: AA&Ox3  Language: speech is not dysarthric.  Naming, repetition, fluency, and comprehension intact but there are subtle paraphasic errors -it is in the realm of possibility that that is because of the fact that he has a thick accent and so do I and both of [place] are wearing masks and he also is hard of hearing. Cranial Nerves: PERRL. EOMI, visual fields full, no facial asymmetry, facial sensation intact, hearing intact, tongue/uvula/soft palate midline, normal sternocleidomastoid and trapezius muscle strength. No evidence of tongue atrophy or fibrillations Motor: 5/5 in all 4 with no vertical drift Tone: is normal and bulk is normal Sensation- Intact to light touch bilaterally, no extinction Coordination: FTN  intact bilaterally Gait- deferred  NIHSS - 1   Labs I have reviewed labs in epic and the results pertinent to this consultation are:  CBC    Component Value Date/Time   WBC 8.6 11/24/2018 1754   RBC 5.37 11/24/2018 1754   HGB 16.7 11/24/2018 1802   HCT 49.0 11/24/2018 1802   PLT 147 (L) 11/24/2018 1754   MCV 94.4 11/24/2018 1754   MCH 30.2 11/24/2018 1754   MCHC 32.0 11/24/2018 1754   RDW 13.9 11/24/2018 1754   LYMPHSABS 1.7 11/24/2018 1754   MONOABS 1.0 11/24/2018 1754   EOSABS 0.1 11/24/2018 1754   BASOSABS 0.0 11/24/2018 1754    CMP     Component Value Date/Time   NA 139 11/24/2018 1802   K 4.5 11/24/2018 1802   CL 106 11/24/2018 1802   CO2 23 11/24/2018 1754   GLUCOSE 136 (H) 11/24/2018 1802   BUN 39 (H) 11/24/2018 1802   CREATININE 1.90 (H) 11/24/2018 1802   CREATININE 2.01 (H) 04/01/2015 0927   CALCIUM 9.1 11/24/2018 1754   PROT 6.9 11/24/2018 1754   ALBUMIN 3.7 11/24/2018 1754   AST 21 11/24/2018 1754   ALT 22 11/24/2018 1754   ALKPHOS 45 11/24/2018 1754   BILITOT 0.7 11/24/2018 1754   GFRNONAA 31 (L) 11/24/2018 1754   GFRAA 36 (L) 11/24/2018 1754   Imaging I have reviewed the images  obtained:  CT-scan of the brain -no acute changes.  Assessment: 85 year old man with a past medical history as above presenting with episodes of confusion and word finding difficulty. On examination, there is subtle paraphasic errors but no other focal neurological deficits. Of note, during my encounter he started complaining of feeling that he is going to have a spell of confusion again. This did not last for more than a few seconds and he felt better again. He said he had been feeling sick with his complaints of some chills since yesterday and feeling lightheaded. He does require a stroke/TIA work-up and possibly cardiac input while he is inpatient might be helpful as well.  Impression: Episodes of confusion and word finding difficulty-concern for stroke/TIA Pacemaker for tachybradycardia syndrome-not compatible with MRI Diabetes Hyperlipidemia  Recommendations: -MRI of the brain cannot be done due to non-compatibility of the pacemaker and pacemaker leads -I would recommend repeating a CT scan tomorrow-CT head without contrast. -CTA head and neck -I would also recommend interrogating his pacemaker and checking with cardiology as some of his symptoms might be related to a cardiac etiology as well.  If possible, contact his cardiologist to see if he would like to see the patient while he is inpatient as well. -Frequent neurochecks -2D echocardiogram -Hemoglobin A1c -Lipid panel -Physical therapy -Occupational Therapy -Speech therapy -N.p.o. until cleared by bedside swallow evaluation or formal swallow eval -He is currently on aspirin and Plavix-I will continue dual antiplatelets for now and consider anticoagulation if there is any evidence of atrial fibrillation. -I would also continue the statin that she is on rosuvastatin.  Discussed with the ED provider Dr. Billy Fischer and admitting hospitalist Dr. Myna Hidalgo.  Stroke team will continue to follow with you  -- Amie Portland, MD [facility]  Neurohospitalist Pager: [phone] If 7pm to 7am, please call on call as listed on AMION.

## 2018-11-25 ENCOUNTER — Observation Stay (HOSPITAL_BASED_OUTPATIENT_CLINIC_OR_DEPARTMENT_OTHER): Payer: Medicare Other

## 2018-11-25 ENCOUNTER — Ambulatory Visit (INDEPENDENT_AMBULATORY_CARE_PROVIDER_SITE_OTHER): Payer: Medicare Other | Admitting: *Deleted

## 2018-11-25 DIAGNOSIS — R079 Chest pain, unspecified: Secondary | ICD-10-CM | POA: Diagnosis not present

## 2018-11-25 DIAGNOSIS — N184 Chronic kidney disease, stage 4 (severe): Secondary | ICD-10-CM | POA: Diagnosis not present

## 2018-11-25 DIAGNOSIS — G4733 Obstructive sleep apnea (adult) (pediatric): Secondary | ICD-10-CM | POA: Diagnosis not present

## 2018-11-25 DIAGNOSIS — E039 Hypothyroidism, unspecified: Secondary | ICD-10-CM | POA: Diagnosis not present

## 2018-11-25 DIAGNOSIS — E1122 Type 2 diabetes mellitus with diabetic chronic kidney disease: Secondary | ICD-10-CM | POA: Diagnosis not present

## 2018-11-25 DIAGNOSIS — R7989 Other specified abnormal findings of blood chemistry: Secondary | ICD-10-CM | POA: Diagnosis not present

## 2018-11-25 DIAGNOSIS — R778 Other specified abnormalities of plasma proteins: Secondary | ICD-10-CM | POA: Diagnosis present

## 2018-11-25 DIAGNOSIS — Z1159 Encounter for screening for other viral diseases: Secondary | ICD-10-CM | POA: Diagnosis not present

## 2018-11-25 DIAGNOSIS — Z794 Long term (current) use of insulin: Secondary | ICD-10-CM | POA: Diagnosis not present

## 2018-11-25 DIAGNOSIS — R29818 Other symptoms and signs involving the nervous system: Secondary | ICD-10-CM

## 2018-11-25 DIAGNOSIS — I6522 Occlusion and stenosis of left carotid artery: Secondary | ICD-10-CM | POA: Diagnosis not present

## 2018-11-25 DIAGNOSIS — G459 Transient cerebral ischemic attack, unspecified: Secondary | ICD-10-CM

## 2018-11-25 DIAGNOSIS — I495 Sick sinus syndrome: Secondary | ICD-10-CM

## 2018-11-25 DIAGNOSIS — I5042 Chronic combined systolic (congestive) and diastolic (congestive) heart failure: Secondary | ICD-10-CM | POA: Diagnosis not present

## 2018-11-25 DIAGNOSIS — N183 Chronic kidney disease, stage 3 (moderate): Secondary | ICD-10-CM | POA: Diagnosis not present

## 2018-11-25 DIAGNOSIS — I208 Other forms of angina pectoris: Secondary | ICD-10-CM

## 2018-11-25 DIAGNOSIS — J9601 Acute respiratory failure with hypoxia: Secondary | ICD-10-CM | POA: Diagnosis not present

## 2018-11-25 DIAGNOSIS — Z955 Presence of coronary angioplasty implant and graft: Secondary | ICD-10-CM | POA: Diagnosis not present

## 2018-11-25 DIAGNOSIS — I2510 Atherosclerotic heart disease of native coronary artery without angina pectoris: Secondary | ICD-10-CM | POA: Diagnosis not present

## 2018-11-25 DIAGNOSIS — E119 Type 2 diabetes mellitus without complications: Secondary | ICD-10-CM | POA: Diagnosis not present

## 2018-11-25 LAB — GLUCOSE, CAPILLARY
Glucose-Capillary: 98 mg/dL (ref 70–99)
Glucose-Capillary: 161 mg/dL — ABNORMAL HIGH (ref 70–99)
Glucose-Capillary: 223 mg/dL — ABNORMAL HIGH (ref 70–99)
Glucose-Capillary: 160 mg/dL — ABNORMAL HIGH (ref 70–99)

## 2018-11-25 LAB — BASIC METABOLIC PANEL
Anion gap: 9 (ref 5–15)
BUN: 28 mg/dL — ABNORMAL HIGH (ref 8–23)
CO2: 26 mmol/L (ref 22–32)
Calcium: 8.9 mg/dL (ref 8.9–10.3)
Chloride: 103 mmol/L (ref 98–111)
Creatinine, Ser: 1.7 mg/dL — ABNORMAL HIGH (ref 0.61–1.24)
GFR calc Af Amer: 42 mL/min — ABNORMAL LOW (ref >60)
GFR calc non Af Amer: 36 mL/min — ABNORMAL LOW (ref >60)
Glucose, Bld: 161 mg/dL — ABNORMAL HIGH (ref 70–99)
Potassium: 4.6 mmol/L (ref 3.5–5.1)
Sodium: 138 mmol/L (ref 135–145)

## 2018-11-25 LAB — LIPID PANEL
Cholesterol: 156 mg/dL (ref 0–200)
HDL: 35 mg/dL — ABNORMAL LOW (ref >40)
LDL Cholesterol: 74 mg/dL (ref 0–99)
Total CHOL/HDL Ratio: 4.5 RATIO
Triglycerides: 236 mg/dL — ABNORMAL HIGH (ref <150)
VLDL: 47 mg/dL — ABNORMAL HIGH (ref 0–40)

## 2018-11-25 LAB — ECHOCARDIOGRAM COMPLETE: Weight: 4128 oz

## 2018-11-25 LAB — TROPONIN I (HIGH SENSITIVITY): Troponin I (High Sensitivity): 43 ng/L — ABNORMAL HIGH (ref <18)

## 2018-11-25 MED ORDER — PERFLUTREN LIPID MICROSPHERE
4.0000 mL | Freq: Once | INTRAVENOUS | Status: AC
  Administered 2018-11-25: 4 mL via INTRAVENOUS
  Filled 2018-11-25: qty 4

## 2018-11-25 NOTE — Progress Notes (Signed)
Carotid duplex       has been completed. Preliminary results can be found under CV proc through chart review. [name], BS, RDMS, RVT   

## 2018-11-25 NOTE — Evaluation (Signed)
Speech Language Pathology Evaluation Patient Details Name: [name] MRN: [id number] DOB: [birth year] Today's Date: 11/25/2018 Time: 8250-5397 SLP Time Calculation (min) (ACUTE ONLY): 19 min  Problem List:  Patient Active Problem List   Diagnosis Date Noted  . Chest pain 11/25/2018  . Elevated troponin 11/25/2018  . Transient neurological symptoms 11/24/2018  . Hypothyroidism 11/24/2018  . Pain due to onychomycosis of toenails of both feet 11/13/2018  . Morbid obesity ([place]) 02/18/2016  . Chronic combined systolic and diastolic heart failure ([place]) 02/19/2015  . Sinus node dysfunction 03/09/2014  . Second degree AV block 03/09/2014  . Pacemaker - dual chamber St. Jude RF 07/24/2013  . Cardiomyopathy, ischemic 07/24/2013  . CAD (coronary artery disease) 07/24/2013  . OSA (obstructive sleep apnea) 07/24/2013  . CKD (chronic kidney disease) stage 3, GFR 30-59 ml/min (HCC) 07/24/2013  . DM type 2 causing CKD stage 3 ([place]) 07/24/2013  . Mixed hyperlipidemia 07/24/2013   Past Medical History:  Past Medical History:  Diagnosis Date  . Abnormal Doppler ultrasound of carotid artery 12/16/2012   mildly abnormal doppler . no prior studies to compare to. follow up studies are recommended when clinically indicated.  Marland Kitchen Blepharitis   . Cancer ([place])   . Diabetes mellitus without complication ([place])   . Exogenous obesity   . Gastric ulcer   . GI bleed   . History of stress test 06/06/2012   low risk scan. fixed inferior defect consistant with prior infarction with moderatly reduced EF. no appreciable evidence of ischemia. no significant change from previous study  . Hx of echocardiogram 08-15-2012   EF  45-50 % very difficult study, severe LVH, pacemaker, trace TR,mildly enlarged RV,; aorta mildly calcified  . Pacemaker 2011   St. Jude Accent DDDR ; this is the third generator this pt. has had  . Shingles   . UTI (lower urinary tract infection)    Past Surgical History:  Past Surgical  History:  Procedure Laterality Date  . CARDIAC CATHETERIZATION  12-29-2003   rt. coronary angiography was done with a 6 french 4 cm taper coronary cath. This demonstrated  approximately 70 to 75% mildly segmental stenosis in the mid RCA before the acute margin          . CARDIAC CATHETERIZATION  11-19-2003   diagnostic cath,, pt. to get second cath to have stent placed  . cardiac stents    . CATARACT EXTRACTION    . KNEE SURGERY Right   . PROSTATE BIOPSY    . prostate seed implants    . SPINE SURGERY    . WISDOM TOOTH EXTRACTION     HPI:  Pt is an 85 y.o. male with medical history significant for coronary artery disease, ischemic cardiomyopathy, insulin-dependent diabetes mellitus, chronic kidney disease stage III-IV, hypothyroidism, and OSA with CPAP intolerance, who presented to the emergency department for evaluation of confusion and word finding difficulty. CT of the head was negative for acute changes.    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 He reported that his word retrieval difficulty and "confusion" have resolved and he denied any additional deficits in speech, language, or cognition. His speech and language skills are currently within normal limits and no overt cognitive deficits were noted. Further skilled SLP services are not clinically indicated at this time. Pt and nursing were educated regarding results and recommendations; both parties verbalized  understanding as well as agreement with plan of care.    SLP Assessment  SLP Recommendation/Assessment: Patient does not need any further Speech Lanaguage Pathology Services SLP Visit Diagnosis: Aphasia (R47.01)    Follow Up Recommendations  None    Frequency and Duration           SLP Evaluation Cognition  Overall Cognitive Status: Within Functional Limits for tasks  assessed Arousal/Alertness: Awake/alert Orientation Level: Oriented X4 Attention: Focused;Sustained Focused Attention: Appears intact Sustained Attention: Appears intact Memory: Appears intact(Immediate: 3/3; delayed: 3/3) Awareness: Appears intact Problem Solving: Appears intact(5/5)       Comprehension  Auditory Comprehension Overall Auditory Comprehension: Appears within functional limits for tasks assessed Yes/No Questions: Within Functional Limits Basic Biographical Questions: (5/5) Complex Questions: (5/5) Paragraph Comprehension (via yes/no questions): (4/43/4) Commands: Within Functional Limits Two Step Basic Commands: (4/4) Multistep Basic Commands: (4/4) Visual Recognition/Discrimination Discrimination: Within Function Limits Reading Comprehension Reading Status: Within funtional limits    Expression Expression Primary Mode of Expression: Verbal Verbal Expression Overall Verbal Expression: Appears within functional limits for tasks assessed Initiation: No impairment Automatic Speech: Counting;Day of week;Month of year(WNL) Level of Generative/Spontaneous Verbalization: Conversation Repetition: No impairment(5/5) Naming: No impairment Responsive: (5/5) Confrontation: Within functional limits(10/10) Convergent: (Sentence completion:: 5/5) Pragmatics: No impairment Written Expression Dominant Hand: Right   Oral / Motor  Oral Motor/Sensory Function Overall Oral Motor/Sensory Function: Within functional limits Motor Speech Overall Motor Speech: Appears within functional limits for tasks assessed Respiration: Within functional limits Phonation: Normal Resonance: Within functional limits Articulation: Within functional limitis Intelligibility: Intelligible Motor Planning: Witnin functional limits Motor Speech Errors: Not applicable   Aviah Sorci I. Hardin Negus, [place], [place] Office number [phone] Pager [place] 11/25/2018, 12:25 PM

## 2018-11-25 NOTE — Care Management Obs Status (Signed)
[place] NOTIFICATION   Patient Details  Name: [name] MRN: [id number] Date of Birth: 05/08/1932   Medicare Observation Status Notification Given:  Yes    Benard Halsted, [place] 11/25/2018, 5:20 PM

## 2018-11-25 NOTE — Progress Notes (Signed)
Occupational Therapy Evaluation Patient Details Name: [name] MRN: [id number] DOB: [birth year] Today's Date: 11/25/2018    History of Present Illness [name] is a 85 y.o. male with medical history significant for coronary artery disease, ischemic cardiomyopathy, pacemaker in place, insulin-dependent diabetes mellitus, chronic kidney disease stage III-IV, hypothyroidism, and OSA with CPAP intolerance, now presenting to the emergency department for evaluation of confusion and word finding difficulty.Noncontrast head CT is negative for acute intracranial abnormality.    Clinical Impression   PTA, pt was living at home with his wife and son, and was independent with ADL/IADL and modified independent with functional mobility at rollator level. Pt currently requires S-minguard for ADL and functional mobility at RW level. Pt on 3lnc maintained SpO2 >92% throughout session. Began education on energy conservation strategies during ADL/IADL completions. Due to decline in current level of function, pt would benefit from acute OT to address established goals to facilitate safe D/C to venue listed below. At this time, recommend HHOT follow-up. Will continue to follow acutely.     Follow Up Recommendations  Supervision - Intermittent;Home health OT    Equipment Recommendations       Recommendations for Other Services PT consult     Precautions / Restrictions Precautions Precautions: ICD/Pacemaker Restrictions Weight Bearing Restrictions: No      Mobility Bed Mobility               General bed mobility comments: pt in recliner upon arrival  Transfers Overall transfer level: Needs assistance Equipment used: Rolling walker (2 wheeled) Transfers: Sit to/from Stand Sit to Stand: Supervision         General transfer comment: RW for longer distance ambulation    Balance Overall balance assessment: Needs assistance Sitting-balance support: No upper extremity supported;Feet  supported Sitting balance-Leahy Scale: Fair     Standing balance support: No upper extremity supported;During functional activity Standing balance-Leahy Scale: Fair Standing balance comment: pt verbalized preference for RW for longer distance ambulation                           ADL either performed or assessed with clinical judgement   ADL Overall ADL's : Needs assistance/impaired Eating/Feeding: Set up   Grooming: Supervision/safety;Standing   Upper Body Bathing: Supervision/ safety;Standing   Lower Body Bathing: Supervison/ safety;Sit to/from stand   Upper Body Dressing : Supervision/safety   Lower Body Dressing: Supervision/safety;Sit to/from stand   Toilet Transfer: Min guard;Ambulation Toilet Transfer Details (indicate cue type and reason): simulated in room Toileting- Clothing Manipulation and Hygiene: Supervision/safety;Sit to/from stand   Tub/ Banker: Medical sales representative Details (indicate cue type and reason): simulated Functional mobility during ADLs: Min guard;Rolling walker General ADL Comments: minguard for safety;pt on 3lnc SpO2 92%-95% throughout session and during ADL completion;began education on energy conservation strategies      Vision Patient Visual Report: (increased difficulty switching from dark to light)       Perception     Praxis      Pertinent Vitals/Pain Pain Assessment: No/denies pain     Hand Dominance Right   Extremity/Trunk Assessment Upper Extremity Assessment Upper Extremity Assessment: Overall WFL for tasks assessed   Lower Extremity Assessment Lower Extremity Assessment: Defer to PT evaluation   Cervical / Trunk Assessment Cervical / Trunk Assessment: Normal   Communication Communication Communication: No difficulties   Cognition Arousal/Alertness: Awake/alert Behavior During Therapy: WFL for tasks assessed/performed Overall Cognitive Status: Within Functional Limits for tasks  assessed                                     General Comments  3lnc SpO2 >92% throughout session;educated pt on pursed lip breathing and energy conservation strategies    Exercises     Shoulder Instructions      Home Living Family/patient expects to be discharged to:: Private residence Living Arrangements: Spouse/significant other Available Help at Discharge: Family;Available 24 hours/day;Available PRN/intermittently Type of Home: House Home Access: Stairs to enter     Home Layout: Multi-level;Other (Comment)(pt lives on basement and main level) Alternate Level Stairs-Number of Steps: 16 Alternate Level Stairs-Rails: Can reach both Bathroom Shower/Tub: Walk-in shower;Tub/shower unit   Bathroom Toilet: Standard Bathroom Accessibility: Yes How Accessible: Accessible via walker Home Equipment: [place] - 4 wheels;Shower seat          Prior Functioning/Environment Level of Independence: Independent                 OT Problem List: Decreased activity tolerance;Impaired balance (sitting and/or standing);Decreased safety awareness;Decreased knowledge of precautions;Decreased knowledge of use of DME or AE      OT Treatment/Interventions: Self-care/ADL training;Energy conservation;DME and/or AE instruction;Therapeutic exercise;Therapeutic activities;Patient/family education;Balance training    OT Goals(Current goals can be found in the care plan section) Acute Rehab OT Goals Patient Stated Goal: to get stronger OT Goal Formulation: With patient Time For Goal Achievement: 12/09/18 Potential to Achieve Goals: Good ADL Goals Pt Will Perform Grooming: with modified independence;standing Pt Will Perform Upper Body Dressing: Independently Pt Will Perform Lower Body Dressing: Independently;sit to/from stand Pt Will Transfer to Toilet: Independently;ambulating Pt Will Perform Tub/Shower Transfer: Independently Additional ADL Goal #1: Pt will independently  demonstrate use of 3 energy conservation strategies during ADL.  OT Frequency: Min 2X/week   Barriers to D/C:            Co-evaluation              AM-PAC OT "6 Clicks" Daily Activity     Outcome Measure Help from another person eating meals?: None Help from another person taking care of personal grooming?: A Little Help from another person toileting, which includes using toliet, bedpan, or urinal?: A Little Help from another person bathing (including washing, rinsing, drying)?: A Little Help from another person to put on and taking off regular upper body clothing?: A Little Help from another person to put on and taking off regular lower body clothing?: A Little 6 Click Score: 19   End of Session Equipment Utilized During Treatment: Gait belt;Rolling walker;Oxygen Nurse Communication: Mobility status  Activity Tolerance: Patient tolerated treatment well Patient left: with call bell/phone within reach;in chair  OT Visit Diagnosis: Unsteadiness on feet (R26.81);Other abnormalities of gait and mobility (R26.89);Muscle weakness (generalized) (M62.81)                Time: 6060-0459 OT Time Calculation (min): 24 min Charges:  OT General Charges $OT Visit: 1 Visit OT Evaluation $OT Eval Moderate Complexity: 1 Mod OT Treatments $Self Care/Home Management : 8-22 mins  Dorinda Hill OTR/L Acute Rehabilitation Services Office: [place] 11/25/2018, 10:35 AM

## 2018-11-25 NOTE — Consult Note (Signed)
Cardiology Consultation:   Patient ID: [name] MRN: [id number]; DOB: August 26, 1931  Admit date: 11/24/2018 Date of Consult: 11/25/2018  Primary Care Provider: [name], MD Primary Cardiologist: William Klein, MD  Nephrologist: Dr. Justin Cross  Patient Profile:   [name] is a 85 y.o. 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 of with tachycardia-bradycardia syndrome (sinus node dysfunction, second-degree AV block, episodes of paroxysmal atrial tachycardia), status post dual-chamber permanent pacemaker implantation (initial implant 1990, last generator change St. Jude Accent DR RF 2011), congestive heart failure with combined systolic and diastolic dysfunction (mildly depressed LVEF 45 to 50% by last echo in 2014), coronary artery disease with remote percutaneous revascularization (no PCI since 2005), obesity with type 2 diabetes mellitus and mixed hyperlipidemia, chronic kidney disease stage III, systemic hypertension, obstructive sleep apnea intolerant to CPAP who is being seen today for the evaluation of Chest pressure at the request of Dr. Eliseo Cross.   He underwent staged LAD and RCA revascularization with drug-eluting stents in 2005 and has not required subsequent percutaneous revascularization procedures. His most recent nuclear study in January 2014 showed an EF of 34% with a fixed inferior defect and no reversible ischemia.   Last Pacemaker interrogation 08/2018 showed normal device function. Estimated generator longevity is2years, lead parameters are all excellent. There is 55% atrial pacing and>99% ventricular pacing,with increased ventricular pacing compared to past downloads.   Last seen by Dr. Sallyanne Cross 10/16/2018 virtually for dizziness and weakness. Stopped Irbesartan and lasix.   History of Present Illness:   Mr. [name] presented with acute onset of confusion and word difficulty while at home.  He was working on his computer.  Symptoms persisted for 2 hours and eventually resolved  in ER. CT Head negative for acute findings.  Unable to do MRI due to his pacer.  Evaluated by neuro and recommended repeat CT tomorrow.  Cardiology is asked for evaluation of chest pain episode. However,  Patient denies chest pain episode.  He had an episode on July 4 where he did not feel well.  Unable to provide any further information.  Currently patient is oriented to name, year and place but not to date.  Per H & P, patient had acute onset CP in ER. Patient reports chronic dyspnea on exertion however progressively worsened in past 6 months.  He is unable to walk to his mailbox or climb the stairs which he used to do previously.  Chronically sleeps on recliner.  Mild lower extremity edema.  Hs troponin 20>>43. Hgb A1c 7. LDL 74. COVID negative. CXR without acute abnormality. Scr 1.9>>1.7.   CT angio head/Neck: IMPRESSION: 1. Patent carotid and vertebral arteries. 2. Moderate 60% proximal left ICA stenosis with calcified plaque. No additional findings for dissection, aneurysm, or hemodynamically significant stenosis utilizing NASCET criteria. 3. Patent Circle of Willis. No intracranial large vessel occlusion, aneurysm, or significant stenosis.   Device interrogation showed battery longevity 3.9 months. AP 55%, VP > 99%, AF/AT burden < 1.  Heart Pathway Score:     Past Medical History:  Diagnosis Date   Abnormal Doppler ultrasound of carotid artery 12/16/2012   mildly abnormal doppler . no prior studies to compare to. follow up studies are recommended when clinically indicated.   Blepharitis    Cancer ([place])    Diabetes mellitus without complication ([place])    Exogenous obesity    Gastric ulcer    GI bleed    History of stress test 06/06/2012   low risk scan. fixed inferior defect consistant with  prior infarction with moderatly reduced EF. no appreciable evidence of ischemia. no significant change from previous study   Hx of echocardiogram 08-15-2012   EF  45-50 % very difficult  study, severe LVH, pacemaker, trace TR,mildly enlarged RV,; aorta mildly calcified   Pacemaker 2011   St. Jude Accent DDDR ; this is the third generator this pt. has had   Shingles    UTI (lower urinary tract infection)     Past Surgical History:  Procedure Laterality Date   CARDIAC CATHETERIZATION  12-29-2003   rt. coronary angiography was done with a 6 french 4 cm taper coronary cath. This demonstrated  approximately 70 to 75% mildly segmental stenosis in the mid RCA before the acute margin           CARDIAC CATHETERIZATION  11-19-2003   diagnostic cath,, pt. to get second cath to have stent placed   cardiac stents     CATARACT EXTRACTION     KNEE SURGERY Right    PROSTATE BIOPSY     prostate seed implants     SPINE SURGERY     WISDOM TOOTH EXTRACTION      Inpatient Medications: Scheduled Meds:  aspirin EC  81 mg Oral Daily   clopidogrel  75 mg Oral Daily   heparin  5,000 Units Subcutaneous Q8H   insulin aspart  0-5 Units Subcutaneous QHS   insulin aspart  0-9 Units Subcutaneous TID WC   insulin detemir  40 Units Subcutaneous BID   levothyroxine  25 mcg Oral Q0600   omega-3 acid ethyl esters  2 g Oral Daily   pantoprazole  40 mg Oral Daily   rosuvastatin  40 mg Oral q1800   sodium chloride flush  3 mL Intravenous Once   Continuous Infusions:  PRN Meds: acetaminophen **OR** acetaminophen (TYLENOL) oral liquid 160 mg/5 mL **OR** acetaminophen, senna-docusate  Allergies:   No Known Allergies  Social History:   Social History   Socioeconomic History   Marital status: Married    Spouse name: Not on file   Number of children: Not on file   Years of education: Not on file   Highest education level: Not on file  Occupational History   Not on file  Social Needs   Financial resource strain: Not on file   Food insecurity    Worry: Not on file    Inability: Not on file   Transportation needs    Medical: Not on file    Non-medical: Not on  file  Tobacco Use   Smoking status: Never Smoker   Smokeless tobacco: Former Systems developer  Substance and Sexual Activity   Alcohol use: Yes    Comment: daily wine   Drug use: No   Sexual activity: Not on file  Lifestyle   Physical activity    Days per week: Not on file    Minutes per session: Not on file   Stress: Not on file  Relationships   Social connections    Talks on phone: Not on file    Gets together: Not on file    Attends religious service: Not on file    Active member of club or organization: Not on file    Attends meetings of clubs or organizations: Not on file    Relationship status: Not on file   Intimate partner violence    Fear of current or ex partner: Not on file    Emotionally abused: Not on file    Physically abused: Not on file  Forced sexual activity: Not on file  Other Topics Concern   Not on file  Social History Narrative   Not on file    Family History:   Denies family hx of CAD  ROS:  Please see the history of present illness.  All other ROS reviewed and negative.     Physical Exam/Data:   Vitals:   11/25/18 1017 11/25/18 1200 11/25/18 1201 11/25/18 1206  BP: 120/67     Pulse: 75 83    Resp: 17 15    Temp: 98.1 F (36.7 C)   97.8 F (36.6 C)  TempSrc: Oral   Oral  SpO2: 98% 98% (!) 86%   Weight:        Intake/Output Summary (Last 24 hours) at 11/25/2018 1413 Last data filed at 11/25/2018 0600 Gross per 24 hour  Intake 525.92 ml  Output --  Net 525.92 ml   Last 3 Weights 11/24/2018 10/16/2018 09/12/2018  Weight (lbs) 258 lb 258 lb 258 lb  Weight (kg) 117.028 kg 117.028 kg 117.028 kg     Body mass index is 41.02 kg/m.  General:  Obese male in no acute distress, sitting in recliner HEENT: normal Lymph: no adenopathy Neck: JVD difficult to assess due to girth and beard Endocrine:  No thryomegaly Vascular: No carotid bruits; FA pulses 2+ bilaterally without bruits  Cardiac:  normal S1, S2; RRR; no murmur Lungs:  clear to  auscultation bilaterally, no wheezing, rhonchi or rales  Abd: soft, nontender, no hepatomegaly  Ext: trace edema edema Musculoskeletal:  No deformities, BUE and BLE strength normal and equal Skin: warm and dry  Neuro:   no focal abnormalities noted,  patient is oriented to name, year and place but not to date. Psych:  Normal affect   EKG:  The EKG was personally reviewed and demonstrates:  sinus rhythm at 82 bpm, V pacing,  PVCs Telemetry:  Telemetry was personally reviewed and demonstrates:  Sinus rhythm with V packing intermittently at rate of 80s Relevant CV Studies:  Stress test 05/2012 Impression Exercise Capacity:  Lexiscan with no exercise. BP Response:  Normal blood pressure response. Clinical Symptoms:  No significant symptoms noted. ECG Impression:  No significant ST segment change suggestive of ischemia. LV Wall Motion:  Likely underestimated LV Function with septal WMA consistent with LBBB.  EF is more likely ~40-45% - LBBB makes wall motion difficult to assess.  Comparison with Prior Nuclear Study: No significant change from previous study -- inferior defect was present, but attributed to diaphragmatic attenuation.  Overall Impression:  Low risk stress nuclear study. Fixed inferior defect consistent with prior infarction.  with moderately reduced EF.  No appreciable evidence of ischemia.   Echo 2014 Study Conclusions   - Left ventricle: Wall thickness was increased in a pattern  of moderate LVH. There was moderate concentric  hypertrophy. Systolic function was mildly reduced. The  estimated ejection fraction was in the range of 45% to  50%. Regional wall motion abnormalities cannot be  excluded. Doppler parameters are consistent with abnormal  left ventricular relaxation (grade 1 diastolic  dysfunction).  - Ventricular septum: Septal motion showed abnormal function  and dyssynergy.  - Mitral valve: Calcified annulus. Mild regurgitation. Valve    area by pressure half-time: 2.34cm^2.  - Left atrium: The atrium was mildly to moderately dilated.  - Right ventricle: The cavity size was mildly dilated. Wall  thickness was normal.  - Right atrium: The atrium was mildly dilated.  - Atrial septum: No defect or  patent foramen ovale was  identified.  Impressions:   - moderate septal -lateral wall intra-ventricular  dyssynchrony.  EF approzimately 45%.  Technically difficult study. A-V paced rhythm with pacer  induced LBBB.   Laboratory Data:  High Sensitivity Troponin:   Recent Labs  Lab 11/24/18 2145 11/24/18 2337  TROPONINIHS 20* 43*      Chemistry Recent Labs  Lab 11/24/18 1754 11/24/18 1802 11/25/18 1052  NA 139 139 138  K 4.5 4.5 4.6  CL 106 106 103  CO2 23  --  26  GLUCOSE 136* 136* 161*  BUN 35* 39* 28*  CREATININE 1.91* 1.90* 1.70*  CALCIUM 9.1  --  8.9  GFRNONAA 31*  --  36*  GFRAA 36*  --  42*  ANIONGAP 10  --  9    Recent Labs  Lab 11/24/18 1754  PROT 6.9  ALBUMIN 3.7  AST 21  ALT 22  ALKPHOS 45  BILITOT 0.7   Hematology Recent Labs  Lab 11/24/18 1754 11/24/18 1802  WBC 8.6  --   RBC 5.37  --   HGB 16.2 16.7  HCT 50.7 49.0  MCV 94.4  --   MCH 30.2  --   MCHC 32.0  --   RDW 13.9  --   PLT 147*  --     Radiology/Studies:  Ct Angio Head W Or Wo Contrast  Result Date: 11/24/2018 CLINICAL DATA:  85 y/o  M; word-finding difficulty and confusion. EXAM: CT ANGIOGRAPHY HEAD AND NECK TECHNIQUE: Multidetector CT imaging of the head and neck was performed using the standard protocol during bolus administration of intravenous contrast. Multiplanar CT image reconstructions and MIPs were obtained to evaluate the vascular anatomy. Carotid stenosis measurements (when applicable) are obtained utilizing NASCET criteria, using the distal internal carotid diameter as the denominator. CONTRAST:  45mL OMNIPAQUE IOHEXOL 350 MG/ML SOLN COMPARISON:  11/24/2018 CT head. FINDINGS: CTA NECK FINDINGS  Aortic arch: Bovine variant branching. Imaged portion shows no evidence of aneurysm or dissection. No significant stenosis of the major arch vessel origins. Mild calcific atherosclerosis. Right carotid system: No evidence of dissection, stenosis (50% or greater) or occlusion. Non stenotic calcific atherosclerosis of carotid bifurcation. Left carotid system: Moderate 60% proximal left ICA stenosis with calcified plaque. No additional evidence for stenosis, dissection, or occlusion of left carotid system. Vertebral arteries: Right dominant. No evidence of dissection, stenosis (50% or greater) or occlusion. Skeleton: No acute finding. Multilevel flowing anterior ossification compatible with DISH. Other neck: 8 mm nodule in the right lobe of the thyroid gland. Upper chest: Negative. Review of the MIP images confirms the above findings CTA HEAD FINDINGS Anterior circulation: No significant stenosis, proximal occlusion, aneurysm, or vascular malformation. Non stenotic calcific atherosclerosis of internal carotid arteries. Posterior circulation: No significant stenosis, proximal occlusion, aneurysm, or vascular malformation. Venous sinuses: As permitted by contrast timing, patent. Anatomic variants: None significant. Review of the MIP images confirms the above findings IMPRESSION: 1. Patent carotid and vertebral arteries. 2. Moderate 60% proximal left ICA stenosis with calcified plaque. No additional findings for dissection, aneurysm, or hemodynamically significant stenosis utilizing NASCET criteria. 3. Patent Circle of Willis. No intracranial large vessel occlusion, aneurysm, or significant stenosis. Electronically Signed   By: Kristine Garbe M.D.   On: 11/24/2018 22:42   Ct Head Wo Contrast  Result Date: 11/24/2018 CLINICAL DATA:  Possible TIA.  Mental status changes for 30 minutes. EXAM: CT HEAD WITHOUT CONTRAST TECHNIQUE: Contiguous axial images were obtained from the base of the skull  through the vertex  without intravenous contrast. COMPARISON:  None. FINDINGS: Brain: Cerebral and cerebellar atrophy are felt to be within normal variation for age. Moderate low density in the periventricular white matter likely related to small vessel disease. No mass lesion, hemorrhage, hydrocephalus, acute infarct, intra-axial, or extra-axial fluid collection. Vascular: No hyperdense vessel or unexpected calcification. Skull: Normal Sinuses/Orbits: Bilateral cataracts extractions. Mucosal thickening of the right maxillary sinus. Other: None. IMPRESSION: 1.  No acute intracranial abnormality. 2.  Cerebral atrophy and small vessel ischemic change. Electronically Signed   By: Abigail Miyamoto M.D.   On: 11/24/2018 20:16   Ct Angio Neck W Or Wo Contrast  Result Date: 11/24/2018 CLINICAL DATA:  85 y/o  M; word-finding difficulty and confusion. EXAM: CT ANGIOGRAPHY HEAD AND NECK TECHNIQUE: Multidetector CT imaging of the head and neck was performed using the standard protocol during bolus administration of intravenous contrast. Multiplanar CT image reconstructions and MIPs were obtained to evaluate the vascular anatomy. Carotid stenosis measurements (when applicable) are obtained utilizing NASCET criteria, using the distal internal carotid diameter as the denominator. CONTRAST:  14mL OMNIPAQUE IOHEXOL 350 MG/ML SOLN COMPARISON:  11/24/2018 CT head. FINDINGS: CTA NECK FINDINGS Aortic arch: Bovine variant branching. Imaged portion shows no evidence of aneurysm or dissection. No significant stenosis of the major arch vessel origins. Mild calcific atherosclerosis. Right carotid system: No evidence of dissection, stenosis (50% or greater) or occlusion. Non stenotic calcific atherosclerosis of carotid bifurcation. Left carotid system: Moderate 60% proximal left ICA stenosis with calcified plaque. No additional evidence for stenosis, dissection, or occlusion of left carotid system. Vertebral arteries: Right dominant. No evidence of dissection,  stenosis (50% or greater) or occlusion. Skeleton: No acute finding. Multilevel flowing anterior ossification compatible with DISH. Other neck: 8 mm nodule in the right lobe of the thyroid gland. Upper chest: Negative. Review of the MIP images confirms the above findings CTA HEAD FINDINGS Anterior circulation: No significant stenosis, proximal occlusion, aneurysm, or vascular malformation. Non stenotic calcific atherosclerosis of internal carotid arteries. Posterior circulation: No significant stenosis, proximal occlusion, aneurysm, or vascular malformation. Venous sinuses: As permitted by contrast timing, patent. Anatomic variants: None significant. Review of the MIP images confirms the above findings IMPRESSION: 1. Patent carotid and vertebral arteries. 2. Moderate 60% proximal left ICA stenosis with calcified plaque. No additional findings for dissection, aneurysm, or hemodynamically significant stenosis utilizing NASCET criteria. 3. Patent Circle of Willis. No intracranial large vessel occlusion, aneurysm, or significant stenosis. Electronically Signed   By: Kristine Garbe M.D.   On: 11/24/2018 22:42   Dg Chest Port 1 View  Result Date: 11/24/2018 CLINICAL DATA:  85 year old male with acute onset visual changes around 1645 hours. Shortness of breath. EXAM: PORTABLE CHEST 1 VIEW COMPARISON:  Chest radiographs 03/31/2010 and earlier. FINDINGS: Portable AP upright view at 2107 hours. Similar low lung volumes and cardiomegaly. Chronic right chest cardiac pacemaker. Visualized tracheal air column is within normal limits. Allowing for portable technique the lungs are clear. No pneumothorax, pleural effusion or confluent pulmonary opacity. Paucity of bowel gas in the upper abdomen. No acute osseous abnormality identified. IMPRESSION: Low lung volumes.  No acute cardiopulmonary abnormality. Electronically Signed   By: Genevie Ann M.D.   On: 11/24/2018 21:16    Assessment and Plan:   1. Chest pain / Elevated  troponin / DOE - Patient denies chest pain. However had some short of episode on July 4th which he is unable to described properly. Patient had CP episode while in ER per  H & P.  - Troponin Hs 20>>43. EKG without acute ischemic changes.  - Patient has chronic DOE which has worsen in past 6 months progressively. He is unable to climb 3 story house and walk to mail box.  - Will consider ischemic evaluation either inpatient vs outpatient once reviewed with Dr. Debara Pickett. Currently chest pain free. Pending echo.   2. CAD  - S/p DES stenting proximal LAD stenosis and DX2 POBA, June, 2005 and staged RCA DES, August, 2005.  - Last stress test in 2014 was low risk.  - Per Dr. Sallyanne Cross, avoid contrast-based procedures due to advancing kidney disease  3. SSS / 2nd degree AV block -  Device interrogation 7/6 showed battery longevity 3.9 months. AP 55%, VP > 99%, AF/AT burden < 1.  4. Chronic combined systolic and diastolic dysfunction  - Pending echo this admission - CXR without acute findings - Lungs clear  5. OSA - Unable to tolerate CPAP  6.  Transient neurologic deficits - Per primary/neurology   For questions or updates, please contact [employer] Please consult [email or web address] for contact info under     Jarrett Soho, PA  11/25/2018 2:13 PM

## 2018-11-25 NOTE — TOC Initial Note (Signed)
Transition of Care [facility]) - Initial/Assessment Note    Patient Details  Name: Nidal Rivet MRN: [id number] Date of Birth: 25-Feb-1932  [facility] CM/SW Contact:    Benard Halsted, LCSW Phone Number: 11/25/2018, 5:25 PM  Clinical Narrative:                 CSW spoke with patient regarding PT recommendation of home health services. Patient states that he is accepting of services. CSW will reach out to [facility] for RN/PT/OT. CSW confirmed home address on facesheet. Patient reports that he has all needed equipment at home. He does not have oxygen at home. CSW will re-evalulate O2 needs at DC.   Expected Discharge Plan: [place] Barriers to Discharge: Continued Medical Work up   Patient Goals and CMS Choice Patient states their goals for this hospitalization and ongoing recovery are:: Return home CMS Medicare.gov Compare Post Acute Care list provided to:: Patient Choice offered to / list presented to : Patient  Expected Discharge Plan and Services Expected Discharge Plan: [place] In-house Referral: NA Discharge Planning Services: CM Consult Post Acute Care Choice: [place] arrangements for the past 2 months: Single Family Home                           HH Arranged: RN, PT, OT [facility] Agency: [facility] Date [place]: 11/25/18 Time [place]: [place] Representative spoke with at [place]: Dorian Pod  Prior Living Arrangements/Services Living arrangements for the past 2 months: [place] Lives with:: Spouse Patient language and need for interpreter reviewed:: Yes Do you feel safe going back to the place where you live?: Yes      Need for Family Participation in Patient Care: No (Comment) Care giver support system in place?: Yes (comment) Current home services: DME Criminal Activity/Legal Involvement Pertinent to Current Situation/Hospitalization: No - Comment as needed  Activities of Daily  Living Home Assistive Devices/Equipment: Walker (specify type) ADL Screening (condition at time of admission) Patient's cognitive ability adequate to safely complete daily activities?: Yes Is the patient deaf or have difficulty hearing?: Yes Does the patient have difficulty seeing, even when wearing glasses/contacts?: No Does the patient have difficulty concentrating, remembering, or making decisions?: No Patient able to express need for assistance with ADLs?: Yes Does the patient have difficulty dressing or bathing?: No Independently performs ADLs?: No Communication: Independent Dressing (OT): Needs assistance Is this a change from baseline?: Change from baseline, expected to last <3days Grooming: Independent Feeding: Independent Bathing: Needs assistance Is this a change from baseline?: Change from baseline, expected to last <3 days Toileting: Needs assistance Is this a change from baseline?: Change from baseline, expected to last <3 days In/Out Bed: Needs assistance Is this a change from baseline?: Change from baseline, expected to last <3 days Walks in Home: Needs assistance Is this a change from baseline?: Change from baseline, expected to last <3 days Does the patient have difficulty walking or climbing stairs?: Yes Weakness of Legs: None Weakness of Arms/Hands: None  Permission Sought/Granted                  Emotional Assessment Appearance:: Appears stated age Attitude/Demeanor/Rapport: Engaged, Charismatic Affect (typically observed): Accepting, Adaptable, Pleasant Orientation: : Oriented to Self, Oriented to Place, Oriented to  Time, Oriented to Situation Alcohol / Substance Use: Not Applicable Psych Involvement: No (comment)  Admission diagnosis:  SOB (shortness of breath) [R06.02] Patient Active Problem List   Diagnosis Date Noted  . Chest pain 11/25/2018  . Elevated troponin 11/25/2018  . Transient neurological symptoms 11/24/2018  . Hypothyroidism  11/24/2018  . Pain due to onychomycosis of toenails of both feet 11/13/2018  . Morbid obesity ([place]) 02/18/2016  . Chronic combined systolic and diastolic heart failure ([place]) 02/19/2015  . Sinus node dysfunction 03/09/2014  . Second degree AV block 03/09/2014  . Pacemaker - dual chamber St. Jude RF 07/24/2013  . Cardiomyopathy, ischemic 07/24/2013  . CAD (coronary artery disease) 07/24/2013  . OSA (obstructive sleep apnea) 07/24/2013  . CKD (chronic kidney disease) stage 3, GFR 30-59 ml/min (HCC) 07/24/2013  . DM type 2 causing CKD stage 3 ([place]) 07/24/2013  . Mixed hyperlipidemia 07/24/2013   PCP:  [name], MD Pharmacy:   [facility], [place] [address] Phone: [phone] Fax: [facility] [address] [place] - [address] [place] [place] [address] Phone: [phone] Fax: [phone]     Social Determinants of Health (SDOH) Interventions    Readmission Risk Interventions No flowsheet data found.

## 2018-11-25 NOTE — Evaluation (Signed)
Physical Therapy Evaluation Patient Details Name: [name] MRN: [id number] DOB: 03-Oct-1931 Today's Date: 11/25/2018   History of Present Illness  Jaylun Fleener is a 85 y.o. male with medical history significant for coronary artery disease, ischemic cardiomyopathy, pacemaker in place, insulin-dependent diabetes mellitus, chronic kidney disease stage III-IV, hypothyroidism, and OSA with CPAP intolerance, now presenting to the emergency department for evaluation of confusion and word finding difficulty.Noncontrast head CT is negative for acute intracranial abnormality. Pt dx with TIA, sinus node dysfunction (pacemaker is being checked).    Clinical Impression  Pt with h/o falls, deconditioned and imbalanced on his feet.  Pt's O2 sats also dropped to 86% during gait on RA with DOE 2/4.   PT to follow acutely for deficits listed below.      Follow Up Recommendations Home health PT    Equipment Recommendations  None recommended by PT    Recommendations for Other Services   NA    Precautions / Restrictions Precautions Precautions: ICD/Pacemaker;Fall Precaution Comments: pt has h/o falls per pt due to diabetic neuropathy Restrictions Weight Bearing Restrictions: No      Mobility  Bed Mobility               General bed mobility comments: pt in recliner upon arrival  Transfers Overall transfer level: Needs assistance Equipment used: None Transfers: Sit to/from Stand Sit to Stand: Supervision         General transfer comment: supervision for safety.  Heavy use of chair armrests for transitions.  Ambulation/Gait Ambulation/Gait assistance: Supervision Gait Distance (Feet): 200 Feet Assistive device: None Gait Pattern/deviations: Step-through pattern;Staggering left;Staggering right     General Gait Details: Pt with staggering gait pattern, especially when turning head to look, walk, and talk to me.  Pt also reporting low back pain, LE weakness and SOB at the end of gait.   His O2 sats decreased to 86% on RA with DOE 2/4.    Modified Rankin (Stroke Patients Only) Modified Rankin (Stroke Patients Only) Pre-Morbid Rankin Score: No significant disability Modified Rankin: Moderately severe disability     Balance Overall balance assessment: Needs assistance Sitting-balance support: No upper extremity supported Sitting balance-Leahy Scale: Good Sitting balance - Comments: needs extra time and energy, but can sit forward and donn shoes and socks with legs crossed.    Standing balance support: No upper extremity supported Standing balance-Leahy Scale: Good Standing balance comment: Pt is unsteady, but can accept minimal challenge with supervision                             Pertinent Vitals/Pain Pain Assessment: No/denies pain    Home Living Family/patient expects to be discharged to:: Private residence Living Arrangements: Spouse/significant other Available Help at Discharge: Family;Available 24 hours/day;Available PRN/intermittently Type of Home: House Home Access: Stairs to enter     Home Layout: Multi-level;Other (Comment)(bedroom in basement) Home Equipment: Walker - 4 wheels;Shower seat;Cane - single point      Prior Function Level of Independence: Independent               Hand Dominance   Dominant Hand: Right    Extremity/Trunk Assessment   Upper Extremity Assessment Upper Extremity Assessment: Defer to OT evaluation(R UE unable to lift over 90 degrees since a fall )    Lower Extremity Assessment Lower Extremity Assessment: Generalized weakness(bil lower leg peripherial neuropathy)    Cervical / Trunk Assessment Cervical / Trunk Assessment: Normal  Communication  Communication: No difficulties  Cognition Arousal/Alertness: Awake/alert Behavior During Therapy: WFL for tasks assessed/performed Overall Cognitive Status: Within Functional Limits for tasks assessed                                         General Comments General comments (skin integrity, edema, etc.): 3lnc SpO2 >92% throughout session;educated pt on pursed lip breathing and energy conservation strategies    Exercises     Assessment/Plan    PT Assessment Patient needs continued PT services  PT Problem List Decreased strength;Decreased activity tolerance;Decreased balance;Decreased mobility;Decreased knowledge of use of DME;Decreased knowledge of precautions;Cardiopulmonary status limiting activity;Impaired sensation;Obesity;Pain       PT Treatment Interventions DME instruction;Gait training;Stair training;Functional mobility training;Therapeutic activities;Therapeutic exercise;Balance training;Neuromuscular re-education;Patient/family education    PT Goals (Current goals can be found in the Care Plan section)  Acute Rehab PT Goals Patient Stated Goal: to get stronger PT Goal Formulation: With patient Time For Goal Achievement: 12/09/18 Potential to Achieve Goals: Good    Frequency Min 4X/week           AM-PAC PT "6 Clicks" Mobility  Outcome Measure Help needed turning from your back to your side while in a flat bed without using bedrails?: A Little Help needed moving from lying on your back to sitting on the side of a flat bed without using bedrails?: A Little Help needed moving to and from a bed to a chair (including a wheelchair)?: A Little Help needed standing up from a chair using your arms (e.g., wheelchair or bedside chair)?: A Little Help needed to walk in hospital room?: A Little Help needed climbing 3-5 steps with a railing? : A Little 6 Click Score: 18    End of Session   Activity Tolerance: Patient limited by fatigue Patient left: in chair;with call bell/phone within reach Nurse Communication: Mobility status;Other (comment)(decreased O2 sats) PT Visit Diagnosis: Muscle weakness (generalized) (M62.81);History of falling (Z91.81);Difficulty in walking, not elsewhere classified (R26.2)     Time: 7290-2111 PT Time Calculation (min) (ACUTE ONLY): 26 min   Charges:      Wells Guiles B. Taneil Lazarus, PT, DPT  Acute Rehabilitation [phone] pager #[phone] office  @ [facility]: [phone]   PT Evaluation $PT Eval Moderate Complexity: 1 Mod PT Treatments $Gait Training: 8-22 mins       11/25/2018, 12:05 PM

## 2018-11-25 NOTE — Progress Notes (Signed)
STROKE TEAM PROGRESS NOTE   INTERVAL HISTORY I have reviewed history of presenting illness with the patient in details.  I reviewed electronic medical records as well as imaging films in PACS.  He presented with transient word finding difficulties and confusion which appears to have resolved.  CT scan of the head was unremarkable.  CT angiogram showed 60% left ICA stenosis.  This could be potentially symptomatic and will need to check this further by correlating with carotid ultrasound. Vitals:   11/25/18 0537 11/25/18 0637 11/25/18 0755 11/25/18 1017  BP: (!) 104/49 127/78 121/76 120/67  Pulse: 64 68 73 75  Resp: 16 18  17   Temp:   97.6 F (36.4 C) 98.1 F (36.7 C)  TempSrc:   Oral Oral  SpO2: 100% 100% 99% 98%  Weight:        CBC:  Recent Labs  Lab 11/24/18 1754 11/24/18 1802  WBC 8.6  --   NEUTROABS 5.8  --   HGB 16.2 16.7  HCT 50.7 49.0  MCV 94.4  --   PLT 147*  --     Basic Metabolic Panel:  Recent Labs  Lab 11/24/18 1754 11/24/18 1802  NA 139 139  K 4.5 4.5  CL 106 106  CO2 23  --   GLUCOSE 136* 136*  BUN 35* 39*  CREATININE 1.91* 1.90*  CALCIUM 9.1  --    Lipid Panel:     Component Value Date/Time   CHOL 156 11/24/2018 2337   TRIG 236 (H) 11/24/2018 2337   HDL 35 (L) 11/24/2018 2337   CHOLHDL 4.5 11/24/2018 2337   VLDL 47 (H) 11/24/2018 2337   LDLCALC 74 11/24/2018 2337   HgbA1c:  Lab Results  Component Value Date   HGBA1C 7.0 (H) 11/24/2018   Urine Drug Screen: No results found for: LABOPIA, COCAINSCRNUR, LABBENZ, AMPHETMU, THCU, LABBARB  Alcohol Level No results found for: ETH  IMAGING Ct Angio Head W Or Wo Contrast  Result Date: 11/24/2018 CLINICAL DATA:  85 y/o  M; word-finding difficulty and confusion. EXAM: CT ANGIOGRAPHY HEAD AND NECK TECHNIQUE: Multidetector CT imaging of the head and neck was performed using the standard protocol during bolus administration of intravenous contrast. Multiplanar CT image reconstructions and MIPs were  obtained to evaluate the vascular anatomy. Carotid stenosis measurements (when applicable) are obtained utilizing NASCET criteria, using the distal internal carotid diameter as the denominator. CONTRAST:  62mL OMNIPAQUE IOHEXOL 350 MG/ML SOLN COMPARISON:  11/24/2018 CT head. FINDINGS: CTA NECK FINDINGS Aortic arch: Bovine variant branching. Imaged portion shows no evidence of aneurysm or dissection. No significant stenosis of the major arch vessel origins. Mild calcific atherosclerosis. Right carotid system: No evidence of dissection, stenosis (50% or greater) or occlusion. Non stenotic calcific atherosclerosis of carotid bifurcation. Left carotid system: Moderate 60% proximal left ICA stenosis with calcified plaque. No additional evidence for stenosis, dissection, or occlusion of left carotid system. Vertebral arteries: Right dominant. No evidence of dissection, stenosis (50% or greater) or occlusion. Skeleton: No acute finding. Multilevel flowing anterior ossification compatible with DISH. Other neck: 8 mm nodule in the right lobe of the thyroid gland. Upper chest: Negative. Review of the MIP images confirms the above findings CTA HEAD FINDINGS Anterior circulation: No significant stenosis, proximal occlusion, aneurysm, or vascular malformation. Non stenotic calcific atherosclerosis of internal carotid arteries. Posterior circulation: No significant stenosis, proximal occlusion, aneurysm, or vascular malformation. Venous sinuses: As permitted by contrast timing, patent. Anatomic variants: None significant. Review of the MIP images confirms the  above findings IMPRESSION: 1. Patent carotid and vertebral arteries. 2. Moderate 60% proximal left ICA stenosis with calcified plaque. No additional findings for dissection, aneurysm, or hemodynamically significant stenosis utilizing NASCET criteria. 3. Patent Circle of Willis. No intracranial large vessel occlusion, aneurysm, or significant stenosis. Electronically Signed    By: Kristine Garbe M.D.   On: 11/24/2018 22:42   Ct Head Wo Contrast  Result Date: 11/24/2018 CLINICAL DATA:  Possible TIA.  Mental status changes for 30 minutes. EXAM: CT HEAD WITHOUT CONTRAST TECHNIQUE: Contiguous axial images were obtained from the base of the skull through the vertex without intravenous contrast. COMPARISON:  None. FINDINGS: Brain: Cerebral and cerebellar atrophy are felt to be within normal variation for age. Moderate low density in the periventricular white matter likely related to small vessel disease. No mass lesion, hemorrhage, hydrocephalus, acute infarct, intra-axial, or extra-axial fluid collection. Vascular: No hyperdense vessel or unexpected calcification. Skull: Normal Sinuses/Orbits: Bilateral cataracts extractions. Mucosal thickening of the right maxillary sinus. Other: None. IMPRESSION: 1.  No acute intracranial abnormality. 2.  Cerebral atrophy and small vessel ischemic change. Electronically Signed   By: Abigail Miyamoto M.D.   On: 11/24/2018 20:16   Ct Angio Neck W Or Wo Contrast  Result Date: 11/24/2018 CLINICAL DATA:  85 y/o  M; word-finding difficulty and confusion. EXAM: CT ANGIOGRAPHY HEAD AND NECK TECHNIQUE: Multidetector CT imaging of the head and neck was performed using the standard protocol during bolus administration of intravenous contrast. Multiplanar CT image reconstructions and MIPs were obtained to evaluate the vascular anatomy. Carotid stenosis measurements (when applicable) are obtained utilizing NASCET criteria, using the distal internal carotid diameter as the denominator. CONTRAST:  77mL OMNIPAQUE IOHEXOL 350 MG/ML SOLN COMPARISON:  11/24/2018 CT head. FINDINGS: CTA NECK FINDINGS Aortic arch: Bovine variant branching. Imaged portion shows no evidence of aneurysm or dissection. No significant stenosis of the major arch vessel origins. Mild calcific atherosclerosis. Right carotid system: No evidence of dissection, stenosis (50% or greater) or  occlusion. Non stenotic calcific atherosclerosis of carotid bifurcation. Left carotid system: Moderate 60% proximal left ICA stenosis with calcified plaque. No additional evidence for stenosis, dissection, or occlusion of left carotid system. Vertebral arteries: Right dominant. No evidence of dissection, stenosis (50% or greater) or occlusion. Skeleton: No acute finding. Multilevel flowing anterior ossification compatible with DISH. Other neck: 8 mm nodule in the right lobe of the thyroid gland. Upper chest: Negative. Review of the MIP images confirms the above findings CTA HEAD FINDINGS Anterior circulation: No significant stenosis, proximal occlusion, aneurysm, or vascular malformation. Non stenotic calcific atherosclerosis of internal carotid arteries. Posterior circulation: No significant stenosis, proximal occlusion, aneurysm, or vascular malformation. Venous sinuses: As permitted by contrast timing, patent. Anatomic variants: None significant. Review of the MIP images confirms the above findings IMPRESSION: 1. Patent carotid and vertebral arteries. 2. Moderate 60% proximal left ICA stenosis with calcified plaque. No additional findings for dissection, aneurysm, or hemodynamically significant stenosis utilizing NASCET criteria. 3. Patent Circle of Willis. No intracranial large vessel occlusion, aneurysm, or significant stenosis. Electronically Signed   By: Kristine Garbe M.D.   On: 11/24/2018 22:42   Dg Chest Port 1 View  Result Date: 11/24/2018 CLINICAL DATA:  85 year old male with acute onset visual changes around 1645 hours. Shortness of breath. EXAM: PORTABLE CHEST 1 VIEW COMPARISON:  Chest radiographs 03/31/2010 and earlier. FINDINGS: Portable AP upright view at 2107 hours. Similar low lung volumes and cardiomegaly. Chronic right chest cardiac pacemaker. Visualized tracheal air column is within normal  limits. Allowing for portable technique the lungs are clear. No pneumothorax, pleural  effusion or confluent pulmonary opacity. Paucity of bowel gas in the upper abdomen. No acute osseous abnormality identified. IMPRESSION: Low lung volumes.  No acute cardiopulmonary abnormality. Electronically Signed   By: Genevie Ann M.D.   On: 11/24/2018 21:16    PHYSICAL EXAM Obese elderly Caucasian male not in distress. . Afebrile. Head is nontraumatic. Neck is supple without bruit.    Cardiac exam no murmur or gallop. Lungs are clear to auscultation. Distal pulses are well felt. Neurological Exam ;  Awake  Alert oriented x 3. Normal speech and language.eye movements full without nystagmus.fundi were not visualized. Vision acuity and fields appear normal. Hearing is normal. Palatal movements are normal. Face symmetric. Tongue midline. Normal strength, tone, reflexes and coordination. Normal sensation. Gait deferred.  ASSESSMENT/PLAN Mr. [name] is a 85 y.o. male with history of coronary artery disease, ischemic cardiomyopathy, insulin-dependent diabetes mellitus, chronic kidney disease stage III-IV, hypothyroidism, and OSA with CPAP intolerance presenting with confusion and word finding difficulty.   Small left brainStroke not seen on Ct  vs TIA:  L brain in setting of possible ICA stenosis. Need to confirm both stroke and stenosis  CT head No acute abnormality. Small vessel disease. Atrophy.   CTA head & neck L ICA 60%  Repeat CT head in am pending   Carotid doppler to confirm/refute ICA stenosis pending   2D Echo pending   LDL 74  HgbA1c 7.1  Heparin 5000 units sq tid for VTE prophylaxis  aspirin 81 mg daily and clopidogrel 75 mg daily prior to admission, now on aspirin 81 mg daily and clopidogrel 75 mg daily. Continue DAPT at dc  Therapy recommendations:  HH OT  Disposition:  pending   Orthostatic Hypotension Hx Hypertension  home meds adjusted by cardiologist  Stable . Permissive hypertension (OK if < 220/120) but gradually normalize in 5-7 days . Long-term BP goal  normotensive  Hyperlipidemia  Home meds:  crestor 40 and fish oil, resumed in hospital  LDL 74, goal < 70  Continue statin at discharge  Diabetes type II Uncontrolled  HgbA1c 7.1, goal < 7.0  Other Stroke Risk Factors  Advanced age  Former smokeless tobacco user  ETOH use, advised to drink no more than 2 drink(s) a day  Morbid Obesity, Body mass index is 41.02 kg/m., recommend weight loss, diet and exercise as appropriate   CAD  Obstructive sleep apnea w/ CPAP intolerance  Chronic combined systolic and diastolic CHF  Other Active Problems  Sinus node dysfunction w/ [employer] Pacer  CKD stage III-IV  hypothyroidism  Hospital day # 0  I have personally obtained history,examined this patient, reviewed notes, independently viewed imaging studies, participated in medical decision making and plan of care.ROS completed by me personally and pertinent positives fully documented  I have made any additions or clarifications directly to the above note.  He presented with transient confusion and word finding difficulties possibly left hemispheric TIA.  CT angios shows 60% left carotid stenosis which needs to be evaluated further with ultrasound and if it correlates may need consideration for elective left carotid endarterectomy.  Recommend dual antiplatelet therapy and aggressive risk factor modification.  Greater than 50% time during this 35-minute visit was spent on counseling and coordination of care about his TIA and carotid stenosis and answering questions.  Discussed with Dr. Theresa Duty, MD Medical Director [place] Pager: [phone] 11/25/2018 4:19 PM  To contact Stroke Continuity provider, please refer to [email or web address] After hours, contact General Neurology

## 2018-11-25 NOTE — Progress Notes (Signed)
[facility] PROGRESS NOTE  [name] KXF:[id number] DOB: July 05, 1931 DOA: 11/24/2018 PCP: [name], MD  Assessment/Plan  1. Transient neurologic deficits  Presented 11/24/18 following an episode of confusion and word-finding difficulty, resolved prior to admission. Head CT negative for acute findings, no focal deficits on exam.  MRI is precluded by his pacer. Lipid panel with triglycerides 236, HDL 35 and VLDL 47. HgA1c 7.0.  Evaluated by neuro who opined concern for tia/stroke and recommended work up.  -repeat CT ordered this am - PT/OT/SLP eval  - Check CTA head and neck. - echocardiogram  - Continue dual antiplatelets and Crestor    2. Sinus node dysfunction  - He has PPM.   3. Insulin-dependent DM poor control HgA1c 7.0. home regimen includes 70/30 BID -Lantus -SSI for optimal control  4. CAD/with chest pain and elevated troponin.  Brief episode cp last evening. Chest xray with  troponin (high sensitivity) 20> 43. No cp this am. Chart review indicates underwent staged LAD and RCA revascularization with drug-eluting stents in 2005. Most recent nuclear study in January 2014 showed EF 34% with a fixed inferiour defect and no reversible ischemia. - Continue ASA, Plavix, and statin  -cards consult requested.   5. Chronic combined systolic & diastolic CHF  EF 69-67% in 2014. Home meds include BB, avapro and lasix.  Appeared hypervolemic in ED but  not  dyspneic and speaking full sentences. He reported holding his diuretic for the past month d/t orthostasis. Provided with gentle fluids. Given his CKD and plan for CTA, will need to watch for complications of this.  - follow echo -intake and output -daily weight  6. CKD stage III-IV  SCr is 1.91 on admission and 1.90 today. This is close to baseline. -hold nephrotoxins as able.    7. Hypothyroidism  - Continue Synthroid     Code Status: full Family Communication: patient (he said he would update wife) Disposition  Plan: home   Consultants:  Cardiology  neurology  Procedures:  echo  Antibiotics:  HPI/Subjective:  Very pleasant retired Arboriculturist. 85 yo admitted with word searching and intermittent confusion concerning for tia/stroke. Also complained of chest pain last evening. Elevated troponin.    Objective: Vitals:   11/25/18 0755 11/25/18 1017  BP: 121/76 120/67  Pulse: 73 75  Resp:  17  Temp: 97.6 F (36.4 C) 98.1 F (36.7 C)  SpO2: 99% 98%    Intake/Output Summary (Last 24 hours) at 11/25/2018 1102 Last data filed at 11/25/2018 0600 Gross per 24 hour  Intake 525.92 ml  Output -  Net 525.92 ml   Filed Weights   11/24/18 1744  Weight: 117 kg    Exam:   General:  Sitting in chair, smiling no acute distress  Cardiovascular: rrr no mgr trace LE edema  Respiratory: normal effort BS clear bilaterally no wheeze/crackle  Abdomen: obese soft +BS no guarding or rebounding  Musculoskeletal: joints without swelling/erythema   Data Reviewed: Basic Metabolic Panel: Recent Labs  Lab 11/24/18 1754 11/24/18 1802  NA 139 139  K 4.5 4.5  CL 106 106  CO2 23  --   GLUCOSE 136* 136*  BUN 35* 39*  CREATININE 1.91* 1.90*  CALCIUM 9.1  --    Liver Function Tests: Recent Labs  Lab 11/24/18 1754  AST 21  ALT 22  ALKPHOS 45  BILITOT 0.7  PROT 6.9  ALBUMIN 3.7   No results for input(s): LIPASE, AMYLASE in the last 168 hours. No results for input(s): AMMONIA in  the last 168 hours. CBC: Recent Labs  Lab 11/24/18 1754 11/24/18 1802  WBC 8.6  --   NEUTROABS 5.8  --   HGB 16.2 16.7  HCT 50.7 49.0  MCV 94.4  --   PLT 147*  --    Cardiac Enzymes: No results for input(s): CKTOTAL, CKMB, CKMBINDEX, TROPONINI in the last 168 hours. BNP (last 3 results) No results for input(s): BNP in the last 8760 hours.  ProBNP (last 3 results) No results for input(s): PROBNP in the last 8760 hours.  CBG: Recent Labs  Lab 11/24/18 2152 11/25/18 0830  GLUCAP 106* 98     Recent Results (from the past 240 hour(s))  SARS Coronavirus 2 (CEPHEID - Performed in [place] hospital lab), Hosp Order     Status: None   Collection Time: 11/24/18  8:35 PM   Specimen: Nasopharyngeal Swab  Result Value Ref Range Status   SARS Coronavirus 2 NEGATIVE NEGATIVE Final    Comment: (NOTE) If result is NEGATIVE SARS-CoV-2 target nucleic acids are NOT DETECTED. The SARS-CoV-2 RNA is generally detectable in upper and lower  respiratory specimens during the acute phase of infection. The lowest  concentration of SARS-CoV-2 viral copies this assay can detect is 250  copies / mL. A negative result does not preclude SARS-CoV-2 infection  and should not be used as the sole basis for treatment or other  patient management decisions.  A negative result may occur with  improper specimen collection / handling, submission of specimen other  than nasopharyngeal swab, presence of viral mutation(s) within the  areas targeted by this assay, and inadequate number of viral copies  (<250 copies / mL). A negative result must be combined with clinical  observations, patient history, and epidemiological information. If result is POSITIVE SARS-CoV-2 target nucleic acids are DETECTED. The SARS-CoV-2 RNA is generally detectable in upper and lower  respiratory specimens dur ing the acute phase of infection.  Positive  results are indicative of active infection with SARS-CoV-2.  Clinical  correlation with patient history and other diagnostic information is  necessary to determine patient infection status.  Positive results do  not rule out bacterial infection or co-infection with other viruses. If result is PRESUMPTIVE POSTIVE SARS-CoV-2 nucleic acids MAY BE PRESENT.   A presumptive positive result was obtained on the submitted specimen  and confirmed on repeat testing.  While 2019 novel coronavirus  (SARS-CoV-2) nucleic acids may be present in the submitted sample  additional confirmatory  testing may be necessary for epidemiological  and / or clinical management purposes  to differentiate between  SARS-CoV-2 and other Sarbecovirus currently known to infect humans.  If clinically indicated additional testing with an alternate test  methodology [phone]) is advised. The SARS-CoV-2 RNA is generally  detectable in upper and lower respiratory sp ecimens during the acute  phase of infection. The expected result is Negative. Fact Sheet for Patients:  StrictlyIdeas.no Fact Sheet for Healthcare Providers: BankingDealers.co.za This test is not yet approved or cleared by the [place] FDA and has been authorized for detection and/or diagnosis of SARS-CoV-2 by FDA under an Emergency Use Authorization (EUA).  This EUA will remain in effect (meaning this test can be used) for the duration of the COVID-19 declaration under Section 564(b)(1) of the Act, 21 U.S.C. section 360bbb-3(b)(1), unless the authorization is terminated or revoked sooner. Performed at [facility], [place] [address], [place], [place] [address]      Studies: Ct Angio Head W Or Wo Contrast  Result Date: 11/24/2018 CLINICAL DATA:  85 y/o  M; word-finding difficulty and confusion. EXAM: CT ANGIOGRAPHY HEAD AND NECK TECHNIQUE: Multidetector CT imaging of the head and neck was performed using the standard protocol during bolus administration of intravenous contrast. Multiplanar CT image reconstructions and MIPs were obtained to evaluate the vascular anatomy. Carotid stenosis measurements (when applicable) are obtained utilizing NASCET criteria, using the distal internal carotid diameter as the denominator. CONTRAST:  4mL OMNIPAQUE IOHEXOL 350 MG/ML SOLN COMPARISON:  11/24/2018 CT head. FINDINGS: CTA NECK FINDINGS Aortic arch: Bovine variant branching. Imaged portion shows no evidence of aneurysm or dissection. No significant stenosis of the major arch vessel origins. Mild  calcific atherosclerosis. Right carotid system: No evidence of dissection, stenosis (50% or greater) or occlusion. Non stenotic calcific atherosclerosis of carotid bifurcation. Left carotid system: Moderate 60% proximal left ICA stenosis with calcified plaque. No additional evidence for stenosis, dissection, or occlusion of left carotid system. Vertebral arteries: Right dominant. No evidence of dissection, stenosis (50% or greater) or occlusion. Skeleton: No acute finding. Multilevel flowing anterior ossification compatible with DISH. Other neck: 8 mm nodule in the right lobe of the thyroid gland. Upper chest: Negative. Review of the MIP images confirms the above findings CTA HEAD FINDINGS Anterior circulation: No significant stenosis, proximal occlusion, aneurysm, or vascular malformation. Non stenotic calcific atherosclerosis of internal carotid arteries. Posterior circulation: No significant stenosis, proximal occlusion, aneurysm, or vascular malformation. Venous sinuses: As permitted by contrast timing, patent. Anatomic variants: None significant. Review of the MIP images confirms the above findings IMPRESSION: 1. Patent carotid and vertebral arteries. 2. Moderate 60% proximal left ICA stenosis with calcified plaque. No additional findings for dissection, aneurysm, or hemodynamically significant stenosis utilizing NASCET criteria. 3. Patent Circle of Willis. No intracranial large vessel occlusion, aneurysm, or significant stenosis. Electronically Signed   By: Kristine Garbe M.D.   On: 11/24/2018 22:42   Ct Head Wo Contrast  Result Date: 11/24/2018 CLINICAL DATA:  Possible TIA.  Mental status changes for 30 minutes. EXAM: CT HEAD WITHOUT CONTRAST TECHNIQUE: Contiguous axial images were obtained from the base of the skull through the vertex without intravenous contrast. COMPARISON:  None. FINDINGS: Brain: Cerebral and cerebellar atrophy are felt to be within normal variation for age. Moderate low  density in the periventricular white matter likely related to small vessel disease. No mass lesion, hemorrhage, hydrocephalus, acute infarct, intra-axial, or extra-axial fluid collection. Vascular: No hyperdense vessel or unexpected calcification. Skull: Normal Sinuses/Orbits: Bilateral cataracts extractions. Mucosal thickening of the right maxillary sinus. Other: None. IMPRESSION: 1.  No acute intracranial abnormality. 2.  Cerebral atrophy and small vessel ischemic change. Electronically Signed   By: Abigail Miyamoto M.D.   On: 11/24/2018 20:16   Ct Angio Neck W Or Wo Contrast  Result Date: 11/24/2018 CLINICAL DATA:  85 y/o  M; word-finding difficulty and confusion. EXAM: CT ANGIOGRAPHY HEAD AND NECK TECHNIQUE: Multidetector CT imaging of the head and neck was performed using the standard protocol during bolus administration of intravenous contrast. Multiplanar CT image reconstructions and MIPs were obtained to evaluate the vascular anatomy. Carotid stenosis measurements (when applicable) are obtained utilizing NASCET criteria, using the distal internal carotid diameter as the denominator. CONTRAST:  67mL OMNIPAQUE IOHEXOL 350 MG/ML SOLN COMPARISON:  11/24/2018 CT head. FINDINGS: CTA NECK FINDINGS Aortic arch: Bovine variant branching. Imaged portion shows no evidence of aneurysm or dissection. No significant stenosis of the major arch vessel origins. Mild calcific atherosclerosis. Right carotid system: No evidence of dissection, stenosis (50%  or greater) or occlusion. Non stenotic calcific atherosclerosis of carotid bifurcation. Left carotid system: Moderate 60% proximal left ICA stenosis with calcified plaque. No additional evidence for stenosis, dissection, or occlusion of left carotid system. Vertebral arteries: Right dominant. No evidence of dissection, stenosis (50% or greater) or occlusion. Skeleton: No acute finding. Multilevel flowing anterior ossification compatible with DISH. Other neck: 8 mm nodule in  the right lobe of the thyroid gland. Upper chest: Negative. Review of the MIP images confirms the above findings CTA HEAD FINDINGS Anterior circulation: No significant stenosis, proximal occlusion, aneurysm, or vascular malformation. Non stenotic calcific atherosclerosis of internal carotid arteries. Posterior circulation: No significant stenosis, proximal occlusion, aneurysm, or vascular malformation. Venous sinuses: As permitted by contrast timing, patent. Anatomic variants: None significant. Review of the MIP images confirms the above findings IMPRESSION: 1. Patent carotid and vertebral arteries. 2. Moderate 60% proximal left ICA stenosis with calcified plaque. No additional findings for dissection, aneurysm, or hemodynamically significant stenosis utilizing NASCET criteria. 3. Patent Circle of Willis. No intracranial large vessel occlusion, aneurysm, or significant stenosis. Electronically Signed   By: Kristine Garbe M.D.   On: 11/24/2018 22:42   Dg Chest Port 1 View  Result Date: 11/24/2018 CLINICAL DATA:  85 year old male with acute onset visual changes around 1645 hours. Shortness of breath. EXAM: PORTABLE CHEST 1 VIEW COMPARISON:  Chest radiographs 03/31/2010 and earlier. FINDINGS: Portable AP upright view at 2107 hours. Similar low lung volumes and cardiomegaly. Chronic right chest cardiac pacemaker. Visualized tracheal air column is within normal limits. Allowing for portable technique the lungs are clear. No pneumothorax, pleural effusion or confluent pulmonary opacity. Paucity of bowel gas in the upper abdomen. No acute osseous abnormality identified. IMPRESSION: Low lung volumes.  No acute cardiopulmonary abnormality. Electronically Signed   By: Genevie Ann M.D.   On: 11/24/2018 21:16    Scheduled Meds: .  stroke: mapping our early stages of recovery book   Does not apply Once  . aspirin EC  81 mg Oral Daily  . clopidogrel  75 mg Oral Daily  . heparin  5,000 Units Subcutaneous Q8H  .  insulin aspart  0-5 Units Subcutaneous QHS  . insulin aspart  0-9 Units Subcutaneous TID WC  . insulin detemir  40 Units Subcutaneous BID  . levothyroxine  25 mcg Oral Q0600  . omega-3 acid ethyl esters  2 g Oral Daily  . pantoprazole  40 mg Oral Daily  . rosuvastatin  40 mg Oral q1800  . sodium chloride flush  3 mL Intravenous Once   Continuous Infusions:  Principal Problem:   Transient neurological symptoms Active Problems:   Sinus node dysfunction   Chest pain   Elevated troponin   CAD (coronary artery disease)   CKD (chronic kidney disease) stage 3, GFR 30-59 ml/min (HCC)   DM type 2 causing CKD stage 3 (HCC)   Chronic combined systolic and diastolic heart failure ([place])   Hypothyroidism    Time spent: 45 minutes   [place] NP  [facility]  If 7PM-7AM, please contact night-coverage at [email or web address], password [facility] 11/25/2018, 11:02 AM  LOS: 0 days

## 2018-11-26 ENCOUNTER — Telehealth: Payer: Self-pay

## 2018-11-26 ENCOUNTER — Encounter (HOSPITAL_COMMUNITY): Payer: Self-pay | Admitting: Internal Medicine

## 2018-11-26 ENCOUNTER — Observation Stay (HOSPITAL_COMMUNITY): Payer: Medicare Other

## 2018-11-26 DIAGNOSIS — I639 Cerebral infarction, unspecified: Secondary | ICD-10-CM | POA: Diagnosis not present

## 2018-11-26 DIAGNOSIS — I495 Sick sinus syndrome: Secondary | ICD-10-CM

## 2018-11-26 DIAGNOSIS — J9601 Acute respiratory failure with hypoxia: Secondary | ICD-10-CM | POA: Diagnosis present

## 2018-11-26 DIAGNOSIS — R079 Chest pain, unspecified: Secondary | ICD-10-CM

## 2018-11-26 DIAGNOSIS — J9600 Acute respiratory failure, unspecified whether with hypoxia or hypercapnia: Secondary | ICD-10-CM | POA: Diagnosis present

## 2018-11-26 DIAGNOSIS — R29818 Other symptoms and signs involving the nervous system: Secondary | ICD-10-CM | POA: Diagnosis not present

## 2018-11-26 DIAGNOSIS — I5042 Chronic combined systolic (congestive) and diastolic (congestive) heart failure: Secondary | ICD-10-CM | POA: Diagnosis not present

## 2018-11-26 LAB — BASIC METABOLIC PANEL
Anion gap: 10 (ref 5–15)
BUN: 26 mg/dL — ABNORMAL HIGH (ref 8–23)
CO2: 26 mmol/L (ref 22–32)
Calcium: 8.9 mg/dL (ref 8.9–10.3)
Chloride: 104 mmol/L (ref 98–111)
Creatinine, Ser: 1.83 mg/dL — ABNORMAL HIGH (ref 0.61–1.24)
GFR calc Af Amer: 38 mL/min — ABNORMAL LOW (ref >60)
GFR calc non Af Amer: 33 mL/min — ABNORMAL LOW (ref >60)
Glucose, Bld: 89 mg/dL (ref 70–99)
Potassium: 4 mmol/L (ref 3.5–5.1)
Sodium: 140 mmol/L (ref 135–145)

## 2018-11-26 LAB — GLUCOSE, CAPILLARY
Glucose-Capillary: 84 mg/dL (ref 70–99)
Glucose-Capillary: 92 mg/dL (ref 70–99)

## 2018-11-26 LAB — CBC
HCT: 47.2 % (ref 39.0–52.0)
Hemoglobin: 15.4 g/dL (ref 13.0–17.0)
MCH: 30 pg (ref 26.0–34.0)
MCHC: 32.6 g/dL (ref 30.0–36.0)
MCV: 92 fL (ref 80.0–100.0)
Platelets: 142 10*3/uL — ABNORMAL LOW (ref 150–400)
RBC: 5.13 MIL/uL (ref 4.22–5.81)
RDW: 13.8 % (ref 11.5–15.5)
WBC: 7.7 10*3/uL (ref 4.0–10.5)
nRBC: 0 % (ref 0.0–0.2)

## 2018-11-26 MED ORDER — SALINE SPRAY 0.65 % NA SOLN
1.0000 | NASAL | Status: DC | PRN
  Filled 2018-11-26: qty 44

## 2018-11-26 NOTE — TOC Transition Note (Signed)
Transition of Care [facility]) - CM/SW Discharge Note   Patient Details  Name: [name] MRN: [id number] Date of Birth: 04-18-1932  Transition of [facility] CM/SW Contact:  Benard Halsted, LCSW Phone Number: 11/26/2018, 12:45 PM   Clinical Narrative:    Patient will DC to: Home Anticipated DC date: 11/26/18 Family notified: Spouse Transport by: Tax inspector, patient, patient's family notified of DC. Home Health arranged with [facility]. Per RN and PT, patient no longer requires oxygen.   CSW will sign off for now as social work intervention is no longer needed. Please consult [place] again if new needs arise.  Cedric Fishman, LCSW Clinical Social Worker [phone]    Final next level of care: Home w Home Health Services Barriers to Discharge: No Barriers Identified   Patient Goals and CMS Choice Patient states their goals for this hospitalization and ongoing recovery are:: Return home CMS Medicare.gov Compare Post Acute Care list provided to:: Patient Choice offered to / list presented to : Patient  Discharge Placement                Patient to be transferred to facility by: Car Name of family member notified: Patient alerted spouse Patient and family notified of of transfer: 11/26/18  Discharge Plan and Services In-house Referral: NA Discharge Planning Services: CM Consult Post Acute Care Choice: Home Health          DME Arranged: Oxygen DME Agency: [employer] Date DME Agency Contacted: 11/26/18 Time DME Agency Contacted: 23 Representative spoke with at DME Agency: [place]: RN, PT, OT [name] Agency: [facility] Date [place]: 11/25/18 Time [place]: [place] Representative spoke with at [place]: [place] ([place]) Interventions     Readmission Risk Interventions No flowsheet data found.

## 2018-11-26 NOTE — Progress Notes (Signed)
Progress Note  Patient Name: [name] Date of Encounter: 11/26/2018  Primary Cardiologist: Sanda Klein, MD   Subjective   Still a bit confused regarding date and time. Denies CP. He notes having increased symptoms of mild chest discomfort/ palpitations at night that he believes is related to mini panic attacks. Has mild dyspnea, but he reports feels more like nasal congestion.   Inpatient Medications    Scheduled Meds:  aspirin EC  81 mg Oral Daily   clopidogrel  75 mg Oral Daily   heparin  5,000 Units Subcutaneous Q8H   insulin aspart  0-5 Units Subcutaneous QHS   insulin aspart  0-9 Units Subcutaneous TID WC   insulin detemir  40 Units Subcutaneous BID   levothyroxine  25 mcg Oral Q0600   omega-3 acid ethyl esters  2 g Oral Daily   pantoprazole  40 mg Oral Daily   rosuvastatin  40 mg Oral q1800   sodium chloride flush  3 mL Intravenous Once   Continuous Infusions:  PRN Meds: acetaminophen **OR** acetaminophen (TYLENOL) oral liquid 160 mg/5 mL **OR** acetaminophen, senna-docusate, sodium chloride   Vital Signs    Vitals:   11/25/18 1900 11/25/18 1945 11/26/18 0044 11/26/18 0452  BP:  139/69 (!) 122/58 111/65  Pulse: 78 79 81 73  Resp: 20 17 18    Temp:  98.2 F (36.8 C) 97.9 F (36.6 C) 98.4 F (36.9 C)  TempSrc:  Oral Oral Other (Comment)  SpO2: 94% (!) 87% 90% 90%  Weight:        Intake/Output Summary (Last 24 hours) at 11/26/2018 0920 Last data filed at 11/25/2018 1800 Gross per 24 hour  Intake 360 ml  Output --  Net 360 ml   Last 3 Weights 11/24/2018 10/16/2018 09/12/2018  Weight (lbs) 258 lb 258 lb 258 lb  Weight (kg) 117.028 kg 117.028 kg 117.028 kg      Telemetry    n/a - Personally Reviewed  ECG    N/A not performed today- Personally Reviewed  Physical Exam   GEN: elderly obese WM in No acute distress.   Neck: No JVD Cardiac: RRR, no murmurs, rubs, or gallops.  Respiratory: Clear to auscultation bilaterally. GI: Soft,  nontender, non-distended  MS: trace pretibial edema bilaterally Neuro:  Nonfocal  Psych: Normal affect   Labs    High Sensitivity Troponin:   Recent Labs  Lab 11/24/18 2145 11/24/18 2337  TROPONINIHS 20* 43*      Cardiac EnzymesNo results for input(s): TROPONINI in the last 168 hours. No results for input(s): TROPIPOC in the last 168 hours.   Chemistry Recent Labs  Lab 11/24/18 1754 11/24/18 1802 11/25/18 1052 11/26/18 0405  NA 139 139 138 140  K 4.5 4.5 4.6 4.0  CL 106 106 103 104  CO2 23  --  26 26  GLUCOSE 136* 136* 161* 89  BUN 35* 39* 28* 26*  CREATININE 1.91* 1.90* 1.70* 1.83*  CALCIUM 9.1  --  8.9 8.9  PROT 6.9  --   --   --   ALBUMIN 3.7  --   --   --   AST 21  --   --   --   ALT 22  --   --   --   ALKPHOS 45  --   --   --   BILITOT 0.7  --   --   --   GFRNONAA 31*  --  36* 33*  GFRAA 36*  --  42* 38*  ANIONGAP 10  --  9 10     Hematology Recent Labs  Lab 11/24/18 1754 11/24/18 1802 11/26/18 0405  WBC 8.6  --  7.7  RBC 5.37  --  5.13  HGB 16.2 16.7 15.4  HCT 50.7 49.0 47.2  MCV 94.4  --  92.0  MCH 30.2  --  30.0  MCHC 32.0  --  32.6  RDW 13.9  --  13.8  PLT 147*  --  142*    BNPNo results for input(s): BNP, PROBNP in the last 168 hours.   DDimer No results for input(s): DDIMER in the last 168 hours.   Radiology    Ct Angio Head W Or Wo Contrast  Result Date: 11/24/2018 CLINICAL DATA:  85 y/o  M; word-finding difficulty and confusion. EXAM: CT ANGIOGRAPHY HEAD AND NECK TECHNIQUE: Multidetector CT imaging of the head and neck was performed using the standard protocol during bolus administration of intravenous contrast. Multiplanar CT image reconstructions and MIPs were obtained to evaluate the vascular anatomy. Carotid stenosis measurements (when applicable) are obtained utilizing NASCET criteria, using the distal internal carotid diameter as the denominator. CONTRAST:  58mL OMNIPAQUE IOHEXOL 350 MG/ML SOLN COMPARISON:  11/24/2018 CT head.  FINDINGS: CTA NECK FINDINGS Aortic arch: Bovine variant branching. Imaged portion shows no evidence of aneurysm or dissection. No significant stenosis of the major arch vessel origins. Mild calcific atherosclerosis. Right carotid system: No evidence of dissection, stenosis (50% or greater) or occlusion. Non stenotic calcific atherosclerosis of carotid bifurcation. Left carotid system: Moderate 60% proximal left ICA stenosis with calcified plaque. No additional evidence for stenosis, dissection, or occlusion of left carotid system. Vertebral arteries: Right dominant. No evidence of dissection, stenosis (50% or greater) or occlusion. Skeleton: No acute finding. Multilevel flowing anterior ossification compatible with DISH. Other neck: 8 mm nodule in the right lobe of the thyroid gland. Upper chest: Negative. Review of the MIP images confirms the above findings CTA HEAD FINDINGS Anterior circulation: No significant stenosis, proximal occlusion, aneurysm, or vascular malformation. Non stenotic calcific atherosclerosis of internal carotid arteries. Posterior circulation: No significant stenosis, proximal occlusion, aneurysm, or vascular malformation. Venous sinuses: As permitted by contrast timing, patent. Anatomic variants: None significant. Review of the MIP images confirms the above findings IMPRESSION: 1. Patent carotid and vertebral arteries. 2. Moderate 60% proximal left ICA stenosis with calcified plaque. No additional findings for dissection, aneurysm, or hemodynamically significant stenosis utilizing NASCET criteria. 3. Patent Circle of Willis. No intracranial large vessel occlusion, aneurysm, or significant stenosis. Electronically Signed   By: Kristine Garbe M.D.   On: 11/24/2018 22:42   Ct Head Wo Contrast  Result Date: 11/26/2018 CLINICAL DATA:  Stroke follow-up. Episode of transient word-finding difficulties and confusion EXAM: CT HEAD WITHOUT CONTRAST TECHNIQUE: Contiguous axial images were  obtained from the base of the skull through the vertex without intravenous contrast. COMPARISON:  CT head and neck 11/24/2018 FINDINGS: Brain: Mild atrophy and white matter changes are stable. No acute cortical infarct, hemorrhage, or mass lesion is present. Basal ganglia are intact. Insular ribbon is normal. The ventricles are proportionate to the degree of atrophy. No significant extraaxial fluid collection is present. The brainstem and cerebellum are within normal limits. Vascular: Atherosclerotic calcifications are present within the cavernous internal carotid arteries bilaterally. There is no hyperdense vessel. Skull: Calvarium is intact. No focal lytic or blastic lesions are present. Sinuses/Orbits: Chronic right maxillary sinus disease is again noted. There is chronic wall thickening. Mild diffuse mucosal thickening is present throughout  the ethmoid air cells and inferior frontal sinuses bilaterally. Sphenoid sinuses and mastoid air cells are clear. Bilateral lens replacements are noted. Globes and orbits are otherwise unremarkable. IMPRESSION: 1. No acute intracranial abnormality or significant interval change. 2. Stable mild atrophy and white matter disease. 3. Chronic right maxillary sinus disease. Electronically Signed   By: San Morelle M.D.   On: 11/26/2018 05:18   Ct Head Wo Contrast  Result Date: 11/24/2018 CLINICAL DATA:  Possible TIA.  Mental status changes for 30 minutes. EXAM: CT HEAD WITHOUT CONTRAST TECHNIQUE: Contiguous axial images were obtained from the base of the skull through the vertex without intravenous contrast. COMPARISON:  None. FINDINGS: Brain: Cerebral and cerebellar atrophy are felt to be within normal variation for age. Moderate low density in the periventricular white matter likely related to small vessel disease. No mass lesion, hemorrhage, hydrocephalus, acute infarct, intra-axial, or extra-axial fluid collection. Vascular: No hyperdense vessel or unexpected  calcification. Skull: Normal Sinuses/Orbits: Bilateral cataracts extractions. Mucosal thickening of the right maxillary sinus. Other: None. IMPRESSION: 1.  No acute intracranial abnormality. 2.  Cerebral atrophy and small vessel ischemic change. Electronically Signed   By: Abigail Miyamoto M.D.   On: 11/24/2018 20:16   Ct Angio Neck W Or Wo Contrast  Result Date: 11/24/2018 CLINICAL DATA:  85 y/o  M; word-finding difficulty and confusion. EXAM: CT ANGIOGRAPHY HEAD AND NECK TECHNIQUE: Multidetector CT imaging of the head and neck was performed using the standard protocol during bolus administration of intravenous contrast. Multiplanar CT image reconstructions and MIPs were obtained to evaluate the vascular anatomy. Carotid stenosis measurements (when applicable) are obtained utilizing NASCET criteria, using the distal internal carotid diameter as the denominator. CONTRAST:  68mL OMNIPAQUE IOHEXOL 350 MG/ML SOLN COMPARISON:  11/24/2018 CT head. FINDINGS: CTA NECK FINDINGS Aortic arch: Bovine variant branching. Imaged portion shows no evidence of aneurysm or dissection. No significant stenosis of the major arch vessel origins. Mild calcific atherosclerosis. Right carotid system: No evidence of dissection, stenosis (50% or greater) or occlusion. Non stenotic calcific atherosclerosis of carotid bifurcation. Left carotid system: Moderate 60% proximal left ICA stenosis with calcified plaque. No additional evidence for stenosis, dissection, or occlusion of left carotid system. Vertebral arteries: Right dominant. No evidence of dissection, stenosis (50% or greater) or occlusion. Skeleton: No acute finding. Multilevel flowing anterior ossification compatible with DISH. Other neck: 8 mm nodule in the right lobe of the thyroid gland. Upper chest: Negative. Review of the MIP images confirms the above findings CTA HEAD FINDINGS Anterior circulation: No significant stenosis, proximal occlusion, aneurysm, or vascular malformation.  Non stenotic calcific atherosclerosis of internal carotid arteries. Posterior circulation: No significant stenosis, proximal occlusion, aneurysm, or vascular malformation. Venous sinuses: As permitted by contrast timing, patent. Anatomic variants: None significant. Review of the MIP images confirms the above findings IMPRESSION: 1. Patent carotid and vertebral arteries. 2. Moderate 60% proximal left ICA stenosis with calcified plaque. No additional findings for dissection, aneurysm, or hemodynamically significant stenosis utilizing NASCET criteria. 3. Patent Circle of Willis. No intracranial large vessel occlusion, aneurysm, or significant stenosis. Electronically Signed   By: Kristine Garbe M.D.   On: 11/24/2018 22:42   Dg Chest Port 1 View  Result Date: 11/24/2018 CLINICAL DATA:  85 year old male with acute onset visual changes around 1645 hours. Shortness of breath. EXAM: PORTABLE CHEST 1 VIEW COMPARISON:  Chest radiographs 03/31/2010 and earlier. FINDINGS: Portable AP upright view at 2107 hours. Similar low lung volumes and cardiomegaly. Chronic right chest cardiac pacemaker. Visualized  tracheal air column is within normal limits. Allowing for portable technique the lungs are clear. No pneumothorax, pleural effusion or confluent pulmonary opacity. Paucity of bowel gas in the upper abdomen. No acute osseous abnormality identified. IMPRESSION: Low lung volumes.  No acute cardiopulmonary abnormality. Electronically Signed   By: Genevie Ann M.D.   On: 11/24/2018 21:16   Vas US Carotid  Result Date: 11/25/2018 Carotid Arterial Duplex Study Indications:       CVA and CTA report Lt ICA >60%. Comparison Study:  no prior Performing Technologist: June Leap RDMS, RVT  Examination Guidelines: A complete evaluation includes B-mode imaging, spectral Doppler, color Doppler, and power Doppler as needed of all accessible portions of each vessel. Bilateral testing is considered an integral part of a complete  examination. Limited examinations for reoccurring indications may be performed as noted.  Right Carotid Findings: +----------+--------+--------+--------+------------+--------+             PSV cm/s EDV cm/s Stenosis Describe     Comments  +----------+--------+--------+--------+------------+--------+  CCA Prox   78       10                heterogenous           +----------+--------+--------+--------+------------+--------+  CCA Distal 60       14                                       +----------+--------+--------+--------+------------+--------+  ICA Prox   93       19       1-39%    heterogenous           +----------+--------+--------+--------+------------+--------+  ICA Distal 69       16                                       +----------+--------+--------+--------+------------+--------+  ECA        59       7                                        +----------+--------+--------+--------+------------+--------+ +----------+--------+-------+--------------+-------------------+             PSV cm/s EDV cms Describe       Arm Pressure (mmHG)  +----------+--------+-------+--------------+-------------------+  Subclavian                  Not identified                      +----------+--------+-------+--------------+-------------------+ +---------+--------+--+--------+--+---------+  Vertebral PSV cm/s 40 EDV cm/s 13 Antegrade  +---------+--------+--+--------+--+---------+  Left Carotid Findings: +----------+--------+--------+--------+------------+--------+             PSV cm/s EDV cm/s Stenosis Describe     Comments  +----------+--------+--------+--------+------------+--------+  CCA Prox   68       14                                       +----------+--------+--------+--------+------------+--------+  CCA Distal 67       10                                       +----------+--------+--------+--------+------------+--------+  ICA Prox   150      42       40-59%   heterogenous            +----------+--------+--------+--------+------------+--------+  ICA Distal 55       14                                       +----------+--------+--------+--------+------------+--------+  ECA        69       10                                       +----------+--------+--------+--------+------------+--------+ +----------+--------+--------+--------------+-------------------+  Subclavian PSV cm/s EDV cm/s Describe       Arm Pressure (mmHG)  +----------+--------+--------+--------------+-------------------+                               Not identified                      +----------+--------+--------+--------------+-------------------+ +---------+--------+--+--------+--+---------+  Vertebral PSV cm/s 44 EDV cm/s 11 Antegrade  +---------+--------+--+--------+--+---------+  Summary: Right Carotid: Velocities in the right ICA are consistent with a 1-39% stenosis. Left Carotid: Velocities in the left ICA are consistent with a 40-59% stenosis. Vertebrals: Bilateral vertebral arteries demonstrate antegrade flow. *See table(s) above for measurements and observations.     Preliminary     Cardiac Studies   2D Echo 11/25/2018 1. The left ventricle has low normal systolic function, with an ejection fraction of 50-55%. The cavity size was normal. Left ventricular diastolic function could not be evaluated due to nondiagnostic images. No evidence of left ventricular regional  wall motion abnormalities.  2. Left atrial size was not well visualized.  3. The mitral valve was not well visualized. There is moderate mitral annular calcification present.  4. The aortic valve is grossly normal. Aortic valve regurgitation was not assessed by color flow Doppler.  5. No intracardiac thrombi or masses were visualized.  6. The interatrial septum was not assessed.  Patient Profile     85 y.o. 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 Symptoms eventually resolved. CT was negative for stroke or bleed. MRI could not be obtained d/t pacer. In the ER he had mentioned chest tightness. Hs Troponin noted at 20 and 43. Pacer was interrogated and shows close to ERI, no afib, 99% v-paced. Echo with normal LVEF and no thrombus.   Assessment & Plan    1. Possible Left Hemispheric TIA: neurology has recommended DAPT and aggressive risk factor modification. He is on ASA and Plavix as well as a statin.   2. 2nd Degree AVB: s/p remote St Jude PPM. Interrogation revealed device is close to ERI. He will need close f/u in device clinic and gen change in the near future.  No afib detected on interrogation.   3. H/o LV Dysfunction: prior Echo showed mildly reduced EF at 45-50%. Repeat echo this admit showed normal LVEF at 50-55%. Diastolic function could not be evaluated due to nondiagnosic images. No  WMAs. Appears euvolemic on exam.   4. CAD: He underwent staged LAD and RCA revascularization with drug-eluting stents in 2005 and has not required subsequent percutaneous revascularization procedures. His most recent nuclear study in January 2014 showed an EF of 34% with a fixed inferior defect and no reversible ischemia. He noted CP this admit but currently CP free. HS troponin abnormal at 20>>43 ng/L but suspect secondary to CKD. Given he is now CP free with normal echo and elevated SCr, will not pursue any further inpatient cardiac w/u. Will recommend outpatient f/u with Dr. Sallyanne Kuster to reassess. Could consider outpatient NST if recurrent CP.   5. Elevated Troponin: Hs troponin 20>>43. Echo shows normal LVEF and no WMAs. Suspect elevated troponin may be secondary to CKD. No CP free. No further inpatient w/u.   6. Carotid Artery Disease: imaging done as part of TIA w/u. CT angios shows 60% left carotid stenosis which needs to  be evaluated further with ultrasound and if it correlates may need consideration for elective left carotid endarterectomy. We can arrange this in our [place] office. We will continue on Crestor 40 mg. Lipid panel shows LDL at 74 mg/dL. Can consider addition of Zetia for further LDL reduction to meet goal of < 70 mg/dL.   For questions or updates, please contact [place] Please consult [email or web address] for contact info under        Signed, Lyda Jester, PA-C  11/26/2018, 9:20 AM

## 2018-11-26 NOTE — TOC Progression Note (Signed)
Transition of Care [facility]) - Progression Note    Patient Details  Name: [name] MRN: [id number] Date of Birth: 02/13/1932  Transition of Care [facility]) CM/SW [place], LCSW Phone Number: 11/26/2018, 11:41 AM  Clinical Narrative:    CSW reached out to [employer] for oxygen set up. Will alert RN when patient able to discharge.    Expected Discharge Plan: [place] Barriers to Discharge: No Barriers Identified  Expected Discharge Plan and Services Expected Discharge Plan: [place] In-house Referral: NA Discharge Planning Services: CM Consult Post Acute Care Choice: [place] arrangements for the past 2 months: Single Family Home Expected Discharge Date: 11/26/18               DME Arranged: Oxygen DME Agency: [employer] Date DME Agency Contacted: 11/26/18 Time DME Agency Contacted: 22 Representative spoke with at DME Agency: [facility] HH Arranged: RN, PT, OT [place] Agency: [facility] [place] Date [place]: 11/25/18 Time [place]: [place] Representative spoke with at [place]: [place] ([place]) Interventions    Readmission Risk Interventions No flowsheet data found.

## 2018-11-26 NOTE — Telephone Encounter (Signed)
Left message for patient to remind of missed remote transmission.  

## 2018-11-26 NOTE — Progress Notes (Signed)
DAILY PROGRESS NOTE   Patient Name: [name] Date of Encounter: 11/26/2018 Cardiologist: Sanda Klein, MD  Chief Complaint   No complaints  Patient Profile   [name] is a 85 y.o. 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 of with tachycardia-bradycardia syndrome (sinus node dysfunction, second-degree AV block, episodes of paroxysmal atrial tachycardia), status post dual-chamber permanent pacemaker implantation(initial implant 1990, last generator change [employer] DR RF 2011),congestive heart failure with combined systolic and diastolic dysfunction (mildly depressed LVEF 45 to 50% by last echo in 2014), coronary artery disease with remote percutaneous revascularization (no PCI since 2005), obesity with type 2 diabetes mellitus and mixed hyperlipidemia, chronic kidney disease stage III,systemic hypertension, obstructive sleep apnea intolerant to CPAP who is being seen today for the evaluation of Chest pressure at the request of Dr. Eliseo Squires.  Subjective   No issues overnight. Pacer interrogation shows device is close to ERI. He thinks his symptoms are similar to prior to when he had pacer placed. Echo was repeated, actually looks better - LVEF improved at 50-55%, no LV thrombus with Definity. Carotid dopplers yesterday show moderate left and mild right carotid stenosis.  Objective   Vitals:   11/25/18 1900 11/25/18 1945 11/26/18 0044 11/26/18 0452  BP:  139/69 (!) 122/58 111/65  Pulse: 78 79 81 73  Resp: 20 17 18    Temp:  98.2 F (36.8 C) 97.9 F (36.6 C) 98.4 F (36.9 C)  TempSrc:  Oral Oral Other (Comment)  SpO2: 94% (!) 87% 90% 90%  Weight:        Intake/Output Summary (Last 24 hours) at 11/26/2018 1021 Last data filed at 11/25/2018 1800 Gross per 24 hour  Intake 360 ml  Output --  Net 360 ml   Filed Weights   11/24/18 1744  Weight: 117 kg    Physical Exam   General appearance: alert and no distress Lungs: clear to auscultation bilaterally Heart: regular  rate and rhythm, S1, S2 normal, no murmur, click, rub or gallop Extremities: extremities normal, atraumatic, no cyanosis or edema Neurologic: Grossly normal  Inpatient Medications    Scheduled Meds:  aspirin EC  81 mg Oral Daily   clopidogrel  75 mg Oral Daily   heparin  5,000 Units Subcutaneous Q8H   insulin aspart  0-5 Units Subcutaneous QHS   insulin aspart  0-9 Units Subcutaneous TID WC   insulin detemir  40 Units Subcutaneous BID   levothyroxine  25 mcg Oral Q0600   omega-3 acid ethyl esters  2 g Oral Daily   pantoprazole  40 mg Oral Daily   rosuvastatin  40 mg Oral q1800   sodium chloride flush  3 mL Intravenous Once    Continuous Infusions:   PRN Meds: acetaminophen **OR** acetaminophen (TYLENOL) oral liquid 160 mg/5 mL **OR** acetaminophen, senna-docusate, sodium chloride   Labs   Results for orders placed or performed during the hospital encounter of 11/24/18 (from the past 48 hour(s))  Protime-INR     Status: None   Collection Time: 11/24/18  5:54 PM  Result Value Ref Range   Prothrombin Time 14.2 11.4 - 15.2 seconds   INR 1.1 0.8 - 1.2    Comment: (NOTE) INR goal varies based on device and disease states. Performed at [facility], [place] [address] [place], [place] [address]   APTT     Status: None   Collection Time: 11/24/18  5:54 PM  Result Value Ref Range   aPTT 31 24 - 36 seconds  Comment: Performed at [facility], [place] [address] [place], [place] [address]  CBC     Status: Abnormal   Collection Time: 11/24/18  5:54 PM  Result Value Ref Range   WBC 8.6 4.0 - 10.5 K/uL   RBC 5.37 4.22 - 5.81 MIL/uL   Hemoglobin 16.2 13.0 - 17.0 g/dL   HCT 50.7 39.0 - 52.0 %   MCV 94.4 80.0 - 100.0 fL   MCH 30.2 26.0 - 34.0 pg   MCHC 32.0 30.0 - 36.0 g/dL   RDW 13.9 11.5 - 15.5 %   Platelets 147 (L) 150 - 400 K/uL   nRBC 0.0 0.0 - 0.2 %    Comment: Performed at [facility], [place] [address], [place], [place] [address]  Differential      Status: None   Collection Time: 11/24/18  5:54 PM  Result Value Ref Range   Neutrophils Relative % 67 %   Neutro Abs 5.8 1.7 - 7.7 K/uL   Lymphocytes Relative 19 %   Lymphs Abs 1.7 0.7 - 4.0 K/uL   Monocytes Relative 12 %   Monocytes Absolute 1.0 0.1 - 1.0 K/uL   Eosinophils Relative 1 %   Eosinophils Absolute 0.1 0.0 - 0.5 K/uL   Basophils Relative 0 %   Basophils Absolute 0.0 0.0 - 0.1 K/uL   Immature Granulocytes 1 %   Abs Immature Granulocytes 0.04 0.00 - 0.07 K/uL    Comment: Performed at [facility] [address] [place], [place] [address]  Comprehensive metabolic panel     Status: Abnormal   Collection Time: 11/24/18  5:54 PM  Result Value Ref Range   Sodium 139 135 - 145 mmol/L   Potassium 4.5 3.5 - 5.1 mmol/L   Chloride 106 98 - 111 mmol/L   CO2 23 22 - 32 mmol/L   Glucose, Bld 136 (H) 70 - 99 mg/dL   BUN 35 (H) 8 - 23 mg/dL   Creatinine, Ser 1.91 (H) 0.61 - 1.24 mg/dL   Calcium 9.1 8.9 - 10.3 mg/dL   Total Protein 6.9 6.5 - 8.1 g/dL   Albumin 3.7 3.5 - 5.0 g/dL   AST 21 15 - 41 U/L   ALT 22 0 - 44 U/L   Alkaline Phosphatase 45 38 - 126 U/L   Total Bilirubin 0.7 0.3 - 1.2 mg/dL   GFR calc non Af Amer 31 (L) >60 mL/min   GFR calc Af Amer 36 (L) >60 mL/min   Anion gap 10 5 - 15    Comment: Performed at [facility] [address] [place], [place] [address]  I-stat chem 8, ED     Status: Abnormal   Collection Time: 11/24/18  6:02 PM  Result Value Ref Range   Sodium 139 135 - 145 mmol/L   Potassium 4.5 3.5 - 5.1 mmol/L   Chloride 106 98 - 111 mmol/L   BUN 39 (H) 8 - 23 mg/dL   Creatinine, Ser 1.90 (H) 0.61 - 1.24 mg/dL   Glucose, Bld 136 (H) 70 - 99 mg/dL   Calcium, Ion 1.03 (L) 1.15 - 1.40 mmol/L   TCO2 27 22 - 32 mmol/L   Hemoglobin 16.7 13.0 - 17.0 g/dL   HCT 49.0 39.0 - 52.0 %  SARS Coronavirus 2 (CEPHEID - Performed in [facility] hospital lab), Hosp Order     Status: None   Collection Time: 11/24/18  8:35 PM   Specimen: Nasopharyngeal  Swab  Result  Value Ref Range   SARS Coronavirus 2 NEGATIVE NEGATIVE    Comment: (NOTE) If result is NEGATIVE SARS-CoV-2 target nucleic acids are NOT DETECTED. The SARS-CoV-2 RNA is generally detectable in upper and lower  respiratory specimens during the acute phase of infection. The lowest  concentration of SARS-CoV-2 viral copies this assay can detect is 250  copies / mL. A negative result does not preclude SARS-CoV-2 infection  and should not be used as the sole basis for treatment or other  patient management decisions.  A negative result may occur with  improper specimen collection / handling, submission of specimen other  than nasopharyngeal swab, presence of viral mutation(s) within the  areas targeted by this assay, and inadequate number of viral copies  (<250 copies / mL). A negative result must be combined with clinical  observations, patient history, and epidemiological information. If result is POSITIVE SARS-CoV-2 target nucleic acids are DETECTED. The SARS-CoV-2 RNA is generally detectable in upper and lower  respiratory specimens dur ing the acute phase of infection.  Positive  results are indicative of active infection with SARS-CoV-2.  Clinical  correlation with patient history and other diagnostic information is  necessary to determine patient infection status.  Positive results do  not rule out bacterial infection or co-infection with other viruses. If result is PRESUMPTIVE POSTIVE SARS-CoV-2 nucleic acids MAY BE PRESENT.   A presumptive positive result was obtained on the submitted specimen  and confirmed on repeat testing.  While 2019 novel coronavirus  (SARS-CoV-2) nucleic acids may be present in the submitted sample  additional confirmatory testing may be necessary for epidemiological  and / or clinical management purposes  to differentiate between  SARS-CoV-2 and other Sarbecovirus currently known to infect humans.  If clinically indicated additional testing  with an alternate test  methodology [phone]) is advised. The SARS-CoV-2 RNA is generally  detectable in upper and lower respiratory sp ecimens during the acute  phase of infection. The expected result is Negative. Fact Sheet for Patients:  StrictlyIdeas.no Fact Sheet for Healthcare Providers: BankingDealers.co.za This test is not yet approved or cleared by the [place] FDA and has been authorized for detection and/or diagnosis of SARS-CoV-2 by FDA under an Emergency Use Authorization (EUA).  This EUA will remain in effect (meaning this test can be used) for the duration of the COVID-19 declaration under Section 564(b)(1) of the Act, 21 U.S.C. section 360bbb-3(b)(1), unless the authorization is terminated or revoked sooner. Performed at [facility], [place] [address] [place], [place] [address]   Troponin I (High Sensitivity)     Status: Abnormal   Collection Time: 11/24/18  9:45 PM  Result Value Ref Range   Troponin I (High Sensitivity) 20 (H) <18 ng/L    Comment: (NOTE) Elevated high sensitivity troponin I (hsTnI) values and significant  changes across serial measurements may suggest ACS but many other  chronic and acute conditions are known to elevate hsTnI results.  Refer to the "Links" section for chest pain algorithms and additional  guidance. Performed at [facility], [place] [address] [place], [place] [address]   CBG monitoring, ED     Status: Abnormal   Collection Time: 11/24/18  9:52 PM  Result Value Ref Range   Glucose-Capillary 106 (H) 70 - 99 mg/dL  Troponin I (High Sensitivity)     Status: Abnormal   Collection Time: 11/24/18 11:37 PM  Result Value Ref Range   Troponin I (High Sensitivity) 43 (H) <18 ng/L  Comment: RESULT CALLED TO, READ BACK BY AND VERIFIED WITH: BULLOCK T,RN 11/25/18 0031 WAYK Performed at [facility], [place] [address], [place], [place] [address]   Hemoglobin A1c     Status:  Abnormal   Collection Time: 11/24/18 11:37 PM  Result Value Ref Range   Hgb A1c MFr Bld 7.0 (H) 4.8 - 5.6 %    Comment: (NOTE) Pre diabetes:          5.7%-6.4% Diabetes:              >6.4% Glycemic control for   <7.0% adults with diabetes    Mean Plasma Glucose 154.2 mg/dL    Comment: Performed at [facility] [address], [place],  [address]  Lipid panel     Status: Abnormal   Collection Time: 11/24/18 11:37 PM  Result Value Ref Range   Cholesterol 156 0 - 200 mg/dL   Triglycerides 236 (H) <150 mg/dL   HDL 35 (L) >40 mg/dL   Total CHOL/HDL Ratio 4.5 RATIO   VLDL 47 (H) 0 - 40 mg/dL   LDL Cholesterol 74 0 - 99 mg/dL    Comment:        Total Cholesterol/HDL:CHD Risk Coronary Heart Disease Risk Table                     Men   Women  1/2 Average Risk   3.4   3.3  Average Risk       5.0   4.4  2 X Average Risk   9.6   7.1  3 X Average Risk  23.4   11.0        Use the calculated Patient Ratio above and the CHD Risk Table to determine the patient's CHD Risk.        ATP III CLASSIFICATION (LDL):  <100     mg/dL   Optimal  100-129  mg/dL   Near or Above                    Optimal  130-159  mg/dL   Borderline  160-189  mg/dL   High  >190     mg/dL   Very High Performed at [facility] [address], [place], [place] [address]   Glucose, capillary     Status: None   Collection Time: 11/25/18  8:30 AM  Result Value Ref Range   Glucose-Capillary 98 70 - 99 mg/dL  Basic metabolic panel     Status: Abnormal   Collection Time: 11/25/18 10:52 AM  Result Value Ref Range   Sodium 138 135 - 145 mmol/L   Potassium 4.6 3.5 - 5.1 mmol/L   Chloride 103 98 - 111 mmol/L   CO2 26 22 - 32 mmol/L   Glucose, Bld 161 (H) 70 - 99 mg/dL   BUN 28 (H) 8 - 23 mg/dL   Creatinine, Ser 1.70 (H) 0.61 - 1.24 mg/dL   Calcium 8.9 8.9 - 10.3 mg/dL   GFR calc non Af Amer 36 (L) >60 mL/min   GFR calc Af Amer 42 (L) >60 mL/min   Anion gap 9 5 - 15    Comment: Performed at  [facility], [place] [address], [place], [place] [address]  Glucose, capillary     Status: Abnormal   Collection Time: 11/25/18 12:04 PM  Result Value Ref Range   Glucose-Capillary 161 (H) 70 - 99 mg/dL  Glucose, capillary  Status: Abnormal   Collection Time: 11/25/18  5:33 PM  Result Value Ref Range   Glucose-Capillary 223 (H) 70 - 99 mg/dL  Glucose, capillary     Status: Abnormal   Collection Time: 11/25/18  9:45 PM  Result Value Ref Range   Glucose-Capillary 160 (H) 70 - 99 mg/dL  CBC     Status: Abnormal   Collection Time: 11/26/18  4:05 AM  Result Value Ref Range   WBC 7.7 4.0 - 10.5 K/uL   RBC 5.13 4.22 - 5.81 MIL/uL   Hemoglobin 15.4 13.0 - 17.0 g/dL   HCT 47.2 39.0 - 52.0 %   MCV 92.0 80.0 - 100.0 fL   MCH 30.0 26.0 - 34.0 pg   MCHC 32.6 30.0 - 36.0 g/dL   RDW 13.8 11.5 - 15.5 %   Platelets 142 (L) 150 - 400 K/uL   nRBC 0.0 0.0 - 0.2 %    Comment: Performed at [facility], [place] [address], [place], [place] [address]  Basic metabolic panel     Status: Abnormal   Collection Time: 11/26/18  4:05 AM  Result Value Ref Range   Sodium 140 135 - 145 mmol/L   Potassium 4.0 3.5 - 5.1 mmol/L   Chloride 104 98 - 111 mmol/L   CO2 26 22 - 32 mmol/L   Glucose, Bld 89 70 - 99 mg/dL   BUN 26 (H) 8 - 23 mg/dL   Creatinine, Ser 1.83 (H) 0.61 - 1.24 mg/dL   Calcium 8.9 8.9 - 10.3 mg/dL   GFR calc non Af Amer 33 (L) >60 mL/min   GFR calc Af Amer 38 (L) >60 mL/min   Anion gap 10 5 - 15    Comment: Performed at [facility] [address] [place], [place] [address]  Glucose, capillary     Status: None   Collection Time: 11/26/18  8:17 AM  Result Value Ref Range   Glucose-Capillary 84 70 - 99 mg/dL    ECG   N/A  Telemetry   Paced rhythm - Personally Reviewed  Radiology    Ct Angio Head W Or Wo Contrast  Result Date: 11/24/2018 CLINICAL DATA:  85 y/o  M; word-finding difficulty and confusion. EXAM: CT ANGIOGRAPHY HEAD AND NECK TECHNIQUE: Multidetector CT  imaging of the head and neck was performed using the standard protocol during bolus administration of intravenous contrast. Multiplanar CT image reconstructions and MIPs were obtained to evaluate the vascular anatomy. Carotid stenosis measurements (when applicable) are obtained utilizing NASCET criteria, using the distal internal carotid diameter as the denominator. CONTRAST:  35mL OMNIPAQUE IOHEXOL 350 MG/ML SOLN COMPARISON:  11/24/2018 CT head. FINDINGS: CTA NECK FINDINGS Aortic arch: Bovine variant branching. Imaged portion shows no evidence of aneurysm or dissection. No significant stenosis of the major arch vessel origins. Mild calcific atherosclerosis. Right carotid system: No evidence of dissection, stenosis (50% or greater) or occlusion. Non stenotic calcific atherosclerosis of carotid bifurcation. Left carotid system: Moderate 60% proximal left ICA stenosis with calcified plaque. No additional evidence for stenosis, dissection, or occlusion of left carotid system. Vertebral arteries: Right dominant. No evidence of dissection, stenosis (50% or greater) or occlusion. Skeleton: No acute finding. Multilevel flowing anterior ossification compatible with DISH. Other neck: 8 mm nodule in the right lobe of the thyroid gland. Upper chest: Negative. Review of the MIP images confirms the above findings CTA HEAD FINDINGS Anterior circulation: No significant stenosis, proximal occlusion, aneurysm, or vascular malformation. Non stenotic calcific atherosclerosis of  internal carotid arteries. Posterior circulation: No significant stenosis, proximal occlusion, aneurysm, or vascular malformation. Venous sinuses: As permitted by contrast timing, patent. Anatomic variants: None significant. Review of the MIP images confirms the above findings IMPRESSION: 1. Patent carotid and vertebral arteries. 2. Moderate 60% proximal left ICA stenosis with calcified plaque. No additional findings for dissection, aneurysm, or hemodynamically  significant stenosis utilizing NASCET criteria. 3. Patent Circle of Willis. No intracranial large vessel occlusion, aneurysm, or significant stenosis. Electronically Signed   By: Kristine Garbe M.D.   On: 11/24/2018 22:42   Ct Head Wo Contrast  Result Date: 11/26/2018 CLINICAL DATA:  Stroke follow-up. Episode of transient word-finding difficulties and confusion EXAM: CT HEAD WITHOUT CONTRAST TECHNIQUE: Contiguous axial images were obtained from the base of the skull through the vertex without intravenous contrast. COMPARISON:  CT head and neck 11/24/2018 FINDINGS: Brain: Mild atrophy and white matter changes are stable. No acute cortical infarct, hemorrhage, or mass lesion is present. Basal ganglia are intact. Insular ribbon is normal. The ventricles are proportionate to the degree of atrophy. No significant extraaxial fluid collection is present. The brainstem and cerebellum are within normal limits. Vascular: Atherosclerotic calcifications are present within the cavernous internal carotid arteries bilaterally. There is no hyperdense vessel. Skull: Calvarium is intact. No focal lytic or blastic lesions are present. Sinuses/Orbits: Chronic right maxillary sinus disease is again noted. There is chronic wall thickening. Mild diffuse mucosal thickening is present throughout the ethmoid air cells and inferior frontal sinuses bilaterally. Sphenoid sinuses and mastoid air cells are clear. Bilateral lens replacements are noted. Globes and orbits are otherwise unremarkable. IMPRESSION: 1. No acute intracranial abnormality or significant interval change. 2. Stable mild atrophy and white matter disease. 3. Chronic right maxillary sinus disease. Electronically Signed   By: San Morelle M.D.   On: 11/26/2018 05:18   Ct Head Wo Contrast  Result Date: 11/24/2018 CLINICAL DATA:  Possible TIA.  Mental status changes for 30 minutes. EXAM: CT HEAD WITHOUT CONTRAST TECHNIQUE: Contiguous axial images were  obtained from the base of the skull through the vertex without intravenous contrast. COMPARISON:  None. FINDINGS: Brain: Cerebral and cerebellar atrophy are felt to be within normal variation for age. Moderate low density in the periventricular white matter likely related to small vessel disease. No mass lesion, hemorrhage, hydrocephalus, acute infarct, intra-axial, or extra-axial fluid collection. Vascular: No hyperdense vessel or unexpected calcification. Skull: Normal Sinuses/Orbits: Bilateral cataracts extractions. Mucosal thickening of the right maxillary sinus. Other: None. IMPRESSION: 1.  No acute intracranial abnormality. 2.  Cerebral atrophy and small vessel ischemic change. Electronically Signed   By: Abigail Miyamoto M.D.   On: 11/24/2018 20:16   Ct Angio Neck W Or Wo Contrast  Result Date: 11/24/2018 CLINICAL DATA:  85 y/o  M; word-finding difficulty and confusion. EXAM: CT ANGIOGRAPHY HEAD AND NECK TECHNIQUE: Multidetector CT imaging of the head and neck was performed using the standard protocol during bolus administration of intravenous contrast. Multiplanar CT image reconstructions and MIPs were obtained to evaluate the vascular anatomy. Carotid stenosis measurements (when applicable) are obtained utilizing NASCET criteria, using the distal internal carotid diameter as the denominator. CONTRAST:  21mL OMNIPAQUE IOHEXOL 350 MG/ML SOLN COMPARISON:  11/24/2018 CT head. FINDINGS: CTA NECK FINDINGS Aortic arch: Bovine variant branching. Imaged portion shows no evidence of aneurysm or dissection. No significant stenosis of the major arch vessel origins. Mild calcific atherosclerosis. Right carotid system: No evidence of dissection, stenosis (50% or greater) or occlusion. Non stenotic calcific atherosclerosis of  carotid bifurcation. Left carotid system: Moderate 60% proximal left ICA stenosis with calcified plaque. No additional evidence for stenosis, dissection, or occlusion of left carotid system. Vertebral  arteries: Right dominant. No evidence of dissection, stenosis (50% or greater) or occlusion. Skeleton: No acute finding. Multilevel flowing anterior ossification compatible with DISH. Other neck: 8 mm nodule in the right lobe of the thyroid gland. Upper chest: Negative. Review of the MIP images confirms the above findings CTA HEAD FINDINGS Anterior circulation: No significant stenosis, proximal occlusion, aneurysm, or vascular malformation. Non stenotic calcific atherosclerosis of internal carotid arteries. Posterior circulation: No significant stenosis, proximal occlusion, aneurysm, or vascular malformation. Venous sinuses: As permitted by contrast timing, patent. Anatomic variants: None significant. Review of the MIP images confirms the above findings IMPRESSION: 1. Patent carotid and vertebral arteries. 2. Moderate 60% proximal left ICA stenosis with calcified plaque. No additional findings for dissection, aneurysm, or hemodynamically significant stenosis utilizing NASCET criteria. 3. Patent Circle of Willis. No intracranial large vessel occlusion, aneurysm, or significant stenosis. Electronically Signed   By: Kristine Garbe M.D.   On: 11/24/2018 22:42   Dg Chest Port 1 View  Result Date: 11/24/2018 CLINICAL DATA:  85 year old male with acute onset visual changes around 1645 hours. Shortness of breath. EXAM: PORTABLE CHEST 1 VIEW COMPARISON:  Chest radiographs 03/31/2010 and earlier. FINDINGS: Portable AP upright view at 2107 hours. Similar low lung volumes and cardiomegaly. Chronic right chest cardiac pacemaker. Visualized tracheal air column is within normal limits. Allowing for portable technique the lungs are clear. No pneumothorax, pleural effusion or confluent pulmonary opacity. Paucity of bowel gas in the upper abdomen. No acute osseous abnormality identified. IMPRESSION: Low lung volumes.  No acute cardiopulmonary abnormality. Electronically Signed   By: Genevie Ann M.D.   On: 11/24/2018 21:16    Vas US Carotid  Result Date: 11/25/2018 Carotid Arterial Duplex Study Indications:       CVA and CTA report Lt ICA >60%. Comparison Study:  no prior Performing Technologist: June Leap RDMS, RVT  Examination Guidelines: A complete evaluation includes B-mode imaging, spectral Doppler, color Doppler, and power Doppler as needed of all accessible portions of each vessel. Bilateral testing is considered an integral part of a complete examination. Limited examinations for reoccurring indications may be performed as noted.  Right Carotid Findings: +----------+--------+--------+--------+------------+--------+             PSV cm/s EDV cm/s Stenosis Describe     Comments  +----------+--------+--------+--------+------------+--------+  CCA Prox   78       10                heterogenous           +----------+--------+--------+--------+------------+--------+  CCA Distal 60       14                                       +----------+--------+--------+--------+------------+--------+  ICA Prox   93       19       1-39%    heterogenous           +----------+--------+--------+--------+------------+--------+  ICA Distal 69       16                                       +----------+--------+--------+--------+------------+--------+  ECA  59       7                                        +----------+--------+--------+--------+------------+--------+ +----------+--------+-------+--------------+-------------------+             PSV cm/s EDV cms Describe       Arm Pressure (mmHG)  +----------+--------+-------+--------------+-------------------+  Subclavian                  Not identified                      +----------+--------+-------+--------------+-------------------+ +---------+--------+--+--------+--+---------+  Vertebral PSV cm/s 40 EDV cm/s 13 Antegrade  +---------+--------+--+--------+--+---------+  Left Carotid Findings: +----------+--------+--------+--------+------------+--------+             PSV cm/s EDV  cm/s Stenosis Describe     Comments  +----------+--------+--------+--------+------------+--------+  CCA Prox   68       14                                       +----------+--------+--------+--------+------------+--------+  CCA Distal 67       10                                       +----------+--------+--------+--------+------------+--------+  ICA Prox   150      42       40-59%   heterogenous           +----------+--------+--------+--------+------------+--------+  ICA Distal 55       14                                       +----------+--------+--------+--------+------------+--------+  ECA        69       10                                       +----------+--------+--------+--------+------------+--------+ +----------+--------+--------+--------------+-------------------+  Subclavian PSV cm/s EDV cm/s Describe       Arm Pressure (mmHG)  +----------+--------+--------+--------------+-------------------+                               Not identified                      +----------+--------+--------+--------------+-------------------+ +---------+--------+--+--------+--+---------+  Vertebral PSV cm/s 44 EDV cm/s 11 Antegrade  +---------+--------+--+--------+--+---------+  Summary: Right Carotid: Velocities in the right ICA are consistent with a 1-39% stenosis. Left Carotid: Velocities in the left ICA are consistent with a 40-59% stenosis. Vertebrals: Bilateral vertebral arteries demonstrate antegrade flow. *See table(s) above for measurements and observations.     Preliminary     Cardiac Studies   Procedure: 2D Echo, Intracardiac Opacification Agent, Cardiac Doppler and Color            Doppler  Indications:    Transient Ischemic Attack 435.9 / G45.9   History:        Patient has prior history of Echocardiogram examinations, most  recent 08/15/2012. Extreme morbid obesity, Chest Pain, Elevated                 Troponin, Coronary Artery Disease, Diabetes II, Hypothyroidism,                  SHortness of Breath, Pacemaker - Dual Chamber St Jude RF,                 Ischemic Cardiomyopathy, Second Degree AV Block.   Sonographer:    Madelaine Etienne RDCS (AE) Referring Phys: [id number] TIMOTHY S OPYD    Sonographer Comments: Patient is morbidly obese, suboptimal apical window, suboptimal subcostal window and Technically difficult study due to poor echo windows. IMPRESSIONS    1. The left ventricle has low normal systolic function, with an ejection fraction of 50-55%. The cavity size was normal. Left ventricular diastolic function could not be evaluated due to nondiagnostic images. No evidence of left ventricular regional  wall motion abnormalities.  2. Left atrial size was not well visualized.  3. The mitral valve was not well visualized. There is moderate mitral annular calcification present.  4. The aortic valve is grossly normal. Aortic valve regurgitation was not assessed by color flow Doppler.  5. No intracardiac thrombi or masses were visualized.  6. The interatrial septum was not assessed.  SUMMARY   Very technically limited study, with almost no windows. Echo contrast shows grossly normal LV EF, no focal WMA. No LV thrombus seen with contrast.  Assessment   1. Principal Problem: 2.   Transient neurological symptoms 3. Active Problems: 4.   CAD (coronary artery disease) 5.   CKD (chronic kidney disease) stage 3, GFR 30-59 ml/min (HCC) 6.   DM type 2 causing CKD stage 3 ([place]) 7.   Sinus node dysfunction 8.   Chronic combined systolic and diastolic heart failure ([place]) 9.   Hypothyroidism 10.   Chest pain 11.   Elevated troponin 12.   Plan   1. Reassuring, albeit technically difficult echo due to body habitus, however, no thrombus with Definity contrast. LVEF improved, despite near 100% v pacing - device close to ERI. Will reach out to Dr. Loletha Grayer to see about scheduling a generator change. No further cardiac suggestions at this time.  [facility] will sign off.    Medication Recommendations:  No changes Other recommendations (labs, testing, etc):  none Follow up as an outpatient:  Dr. Loletha Grayer for pacer generator replacement  Time Spent Directly with Patient:  I have spent a total of 15 minutes with the patient reviewing hospital notes, telemetry, EKGs, labs and examining the patient as well as establishing an assessment and plan that was discussed personally with the patient.  > 50% of time was spent in direct patient care.  Length of Stay:  LOS: 0 days   Pixie Casino, MD, [facility], [place] Director of the [facility]  Card[facility] Diplomate of the American Board of Clinical Lipidology Attending Cardiologist  Direct Dial: [phone]   Fax: [phone]  Website:  [email or web address]  Nadean Corwin [place] 11/26/2018, 10:21 AM

## 2018-11-26 NOTE — Discharge Summary (Addendum)
Physician Discharge Summary  [name] JKK:[id number] DOB: 01-27-1932 DOA: 11/24/2018  PCP: [name], MD  Admit date: 11/24/2018 Discharge date: 11/26/2018  Time spent: 40 minutes  Recommendations for Outpatient Follow-up:  1. Neuro will contact you for OP follow up 2. Call cardiologist office in 1 week (if they have not contacted you) to discuss plan for generator exchange of pacemaker 3. Follow up with PCP 1-2 weeks for evaluation of symptoms. Recommend  OP ultra sound of left carotid to follow progression  4. Home health PT/OT/RN 5. Home oxygen   Discharge Diagnoses:  Principal Problem:   Transient neurological symptoms Active Problems:   Sinus node dysfunction   Chest pain   Elevated troponin   Acute respiratory failure with hypoxia (HCC)   CAD (coronary artery disease)   CKD (chronic kidney disease) stage 3, GFR 30-59 ml/min (HCC)   DM type 2 causing CKD stage 3 (HCC)   Chronic combined systolic and diastolic heart failure ([place])   Morbid obesity ([place])   Hypothyroidism   Discharge Condition: stable  Diet recommendation: heart healthy and carb modified  Filed Weights   11/24/18 1744  Weight: 117 kg    History of present illness:  [name] is a 85 y.o. male with medical history significant for coronary artery disease, ischemic cardiomyopathy, insulin-dependent diabetes mellitus, chronic kidney disease stage III-IV, hypothyroidism, and OSA with CPAP intolerance, presented 11/24/18 to the emergency department for evaluation of confusion and word finding difficulty.  Patient had been out shopping and having an uneventful evening but began to feel confused when arriving back home.  He did not feel particularly lightheaded and denied any focal numbness or weakness, but was having difficulty reading words or seeing the time on a clock.  He was having difficulty finding words to use during this episode as well and symptoms eventually resolved on their own.  He has had  episodes of lightheadedness and near syncope while standing for over a month now, was noted to be orthostatic by his cardiologist, his ARB was stopped, his diuretic and potassium was held, and it was recommended that he wear compression stockings and possibly abdominal binder for this.  No recent fevers, chills, cough, or shortness of breath.   Hospital Course:   1.Transient neurologic deficits/TIA vs small left brain stroke not seen on CT. Evaluated by neurology who opine possible left hemispheric TIA. CT angios showes 60% left carotid stenosis. Head CT and repeat head CT negative for acute findings, no focal deficits on exam. MRI is precluded by his pacer. Lipid panel with triglycerides 236, HDL 35 and VLDL 47. HgA1c 7.0.  Echo with EF 50%. Home health PT recommended. Neuro recommends dual antiplatelets and Crestor  2.Sinus node dysfunction -He has PPM.evaluated by cards who will contact primary cardiologist to arrange for pacer generator exchange as is almost time.   3.Insulin-dependent DMpoor control HgA1c 7.0. home regimen includes 70/30 BID  4.CAD/with chest pain and elevated troponin.  Brief episode cp. Chest xray with troponin (high sensitivity) 20> 43. Chart review indicates underwent staged LAD and RCA revascularization with drug-eluting stents in 2005. Most recent nuclear study in January 2014 showed EF 34% with a fixed inferiour defect and no reversible ischemia. Evaluated by cardiology who opine elevated troponin related to CKD. Recommend OP work up with primary cardiology. Continue ASA, Plavix, and statin  5.Chronic combined systolic & diastolic CHF EF 71% . Home meds include BB, avapro and lasix. Appeared hypervolemic in ED but  not  dyspneic and  speaking full sentences.He reported holding his diuretic for the past month d/t orthostasis. Provided with gentle fluids. Compensated at discharge  6.CKD stage III-IVSCr is 1.91 on admission and 1.83 at discharge.  This is close to baseline.   7.Hypothyroidism -Continue Synthroid  8. Acute respiratory failure with hypoxia. Related to all above as well as obesity and OSA unable to tolerate cpap.  Suspect chronic component as well. Ambulated in hall on room air and oxygen saturation level fell to 86%. Will recommend home oxygen  Procedures:  echo  Consultations:  Cardiology [place]  sethi neurology  Discharge Exam: Vitals:   11/26/18 0044 11/26/18 0452  BP: (!) 122/58 111/65  Pulse: 81 73  Resp: 18   Temp: 97.9 F (36.6 C) 98.4 F (36.9 C)  SpO2: 90% 90%    General: obese awake sitting in chair no acute distress  Cardiovascular: rrr no mgr no LE edema Respiratory: mild increased work of breathing with conversation. BS clear bilaterally no wheeze  Discharge Instructions   Discharge Instructions    Call MD for:  difficulty breathing, headache or visual disturbances   Complete by: As directed    Call MD for:  extreme fatigue   Complete by: As directed    Call MD for:  persistant dizziness or light-headedness   Complete by: As directed    Diet - low sodium heart healthy   Complete by: As directed    Discharge instructions   Complete by: As directed    Call cardiology office in 1 week (if have not heard from office) to discuss plan for generator exchange of pacemaker Take medications as prescribed Neurology will contact you for follow up appointment   Increase activity slowly   Complete by: As directed      Allergies as of 11/26/2018   No Known Allergies     Medication List    TAKE these medications   acetaminophen 500 MG tablet Commonly known as: TYLENOL Take 1,000 mg by mouth daily as needed for mild pain.   aspirin 81 MG tablet Take 81 mg by mouth daily.   calcitRIOL 0.25 MCG capsule Commonly known as: ROCALTROL Take 0.25 mcg by mouth every Monday, Wednesday, and Friday.   clopidogrel 75 MG tablet Commonly known as: PLAVIX Take 75 mg by mouth daily.    COQ10 150 MG Caps Take 1 tablet by mouth daily.   docusate sodium 100 MG capsule Commonly known as: COLACE Take 200 mg by mouth daily.   Fish Oil 1000 MG Caps Take 2,400 mg by mouth daily.   insulin NPH-regular Human (70-30) 100 UNIT/ML injection Inject 65 Units into the skin 2 (two) times daily with a meal.   levothyroxine 25 MCG tablet Commonly known as: SYNTHROID Take 25 mcg by mouth daily.   metoprolol succinate 25 MG 24 hr tablet Commonly known as: TOPROL-XL TAKE 1 & 1/2 (ONE & ONE-HALF) TABLETS BY MOUTH DAILY What changed: See the new instructions.   nitroGLYCERIN 0.4 MG SL tablet Commonly known as: NITROSTAT Place under the tongue.   pantoprazole 40 MG tablet Commonly known as: PROTONIX Take 40 mg by mouth daily.   rosuvastatin 40 MG tablet Commonly known as: CRESTOR Take 40 mg by mouth daily.   vitamin C 500 MG tablet Commonly known as: ASCORBIC ACID Take 500 mg by mouth daily.   Vitamin D-1000 Max St 25 MCG (1000 UT) tablet Generic drug: Cholecalciferol Take 2,000 Units by mouth daily.   vitamin E 400 UNIT capsule Take 400 Units  by mouth daily.            Durable Medical Equipment  (From admission, onward)         Start     Ordered   11/26/18 0814  For home use only DME oxygen  Once    Question Answer Comment  Length of Need 6 Months   Mode or (Route) Nasal cannula   Liters per Minute 2   Frequency Continuous (stationary and portable oxygen unit needed)   Oxygen delivery system Gas      11/26/18 0813         No Known Allergies    The results of significant diagnostics from this hospitalization (including imaging, microbiology, ancillary and laboratory) are listed below for reference.    Significant Diagnostic Studies: Ct Angio Head W Or Wo Contrast  Result Date: 11/24/2018 CLINICAL DATA:  85 y/o  M; word-finding difficulty and confusion. EXAM: CT ANGIOGRAPHY HEAD AND NECK TECHNIQUE: Multidetector CT imaging of the head and neck  was performed using the standard protocol during bolus administration of intravenous contrast. Multiplanar CT image reconstructions and MIPs were obtained to evaluate the vascular anatomy. Carotid stenosis measurements (when applicable) are obtained utilizing NASCET criteria, using the distal internal carotid diameter as the denominator. CONTRAST:  15mL OMNIPAQUE IOHEXOL 350 MG/ML SOLN COMPARISON:  11/24/2018 CT head. FINDINGS: CTA NECK FINDINGS Aortic arch: Bovine variant branching. Imaged portion shows no evidence of aneurysm or dissection. No significant stenosis of the major arch vessel origins. Mild calcific atherosclerosis. Right carotid system: No evidence of dissection, stenosis (50% or greater) or occlusion. Non stenotic calcific atherosclerosis of carotid bifurcation. Left carotid system: Moderate 60% proximal left ICA stenosis with calcified plaque. No additional evidence for stenosis, dissection, or occlusion of left carotid system. Vertebral arteries: Right dominant. No evidence of dissection, stenosis (50% or greater) or occlusion. Skeleton: No acute finding. Multilevel flowing anterior ossification compatible with DISH. Other neck: 8 mm nodule in the right lobe of the thyroid gland. Upper chest: Negative. Review of the MIP images confirms the above findings CTA HEAD FINDINGS Anterior circulation: No significant stenosis, proximal occlusion, aneurysm, or vascular malformation. Non stenotic calcific atherosclerosis of internal carotid arteries. Posterior circulation: No significant stenosis, proximal occlusion, aneurysm, or vascular malformation. Venous sinuses: As permitted by contrast timing, patent. Anatomic variants: None significant. Review of the MIP images confirms the above findings IMPRESSION: 1. Patent carotid and vertebral arteries. 2. Moderate 60% proximal left ICA stenosis with calcified plaque. No additional findings for dissection, aneurysm, or hemodynamically significant stenosis  utilizing NASCET criteria. 3. Patent Circle of Willis. No intracranial large vessel occlusion, aneurysm, or significant stenosis. Electronically Signed   By: Kristine Garbe M.D.   On: 11/24/2018 22:42   Ct Head Wo Contrast  Result Date: 11/26/2018 CLINICAL DATA:  Stroke follow-up. Episode of transient word-finding difficulties and confusion EXAM: CT HEAD WITHOUT CONTRAST TECHNIQUE: Contiguous axial images were obtained from the base of the skull through the vertex without intravenous contrast. COMPARISON:  CT head and neck 11/24/2018 FINDINGS: Brain: Mild atrophy and white matter changes are stable. No acute cortical infarct, hemorrhage, or mass lesion is present. Basal ganglia are intact. Insular ribbon is normal. The ventricles are proportionate to the degree of atrophy. No significant extraaxial fluid collection is present. The brainstem and cerebellum are within normal limits. Vascular: Atherosclerotic calcifications are present within the cavernous internal carotid arteries bilaterally. There is no hyperdense vessel. Skull: Calvarium is intact. No focal lytic or blastic lesions are present.  Sinuses/Orbits: Chronic right maxillary sinus disease is again noted. There is chronic wall thickening. Mild diffuse mucosal thickening is present throughout the ethmoid air cells and inferior frontal sinuses bilaterally. Sphenoid sinuses and mastoid air cells are clear. Bilateral lens replacements are noted. Globes and orbits are otherwise unremarkable. IMPRESSION: 1. No acute intracranial abnormality or significant interval change. 2. Stable mild atrophy and white matter disease. 3. Chronic right maxillary sinus disease. Electronically Signed   By: San Morelle M.D.   On: 11/26/2018 05:18   Ct Head Wo Contrast  Result Date: 11/24/2018 CLINICAL DATA:  Possible TIA.  Mental status changes for 30 minutes. EXAM: CT HEAD WITHOUT CONTRAST TECHNIQUE: Contiguous axial images were obtained from the base of  the skull through the vertex without intravenous contrast. COMPARISON:  None. FINDINGS: Brain: Cerebral and cerebellar atrophy are felt to be within normal variation for age. Moderate low density in the periventricular white matter likely related to small vessel disease. No mass lesion, hemorrhage, hydrocephalus, acute infarct, intra-axial, or extra-axial fluid collection. Vascular: No hyperdense vessel or unexpected calcification. Skull: Normal Sinuses/Orbits: Bilateral cataracts extractions. Mucosal thickening of the right maxillary sinus. Other: None. IMPRESSION: 1.  No acute intracranial abnormality. 2.  Cerebral atrophy and small vessel ischemic change. Electronically Signed   By: Abigail Miyamoto M.D.   On: 11/24/2018 20:16   Ct Angio Neck W Or Wo Contrast  Result Date: 11/24/2018 CLINICAL DATA:  85 y/o  M; word-finding difficulty and confusion. EXAM: CT ANGIOGRAPHY HEAD AND NECK TECHNIQUE: Multidetector CT imaging of the head and neck was performed using the standard protocol during bolus administration of intravenous contrast. Multiplanar CT image reconstructions and MIPs were obtained to evaluate the vascular anatomy. Carotid stenosis measurements (when applicable) are obtained utilizing NASCET criteria, using the distal internal carotid diameter as the denominator. CONTRAST:  70mL OMNIPAQUE IOHEXOL 350 MG/ML SOLN COMPARISON:  11/24/2018 CT head. FINDINGS: CTA NECK FINDINGS Aortic arch: Bovine variant branching. Imaged portion shows no evidence of aneurysm or dissection. No significant stenosis of the major arch vessel origins. Mild calcific atherosclerosis. Right carotid system: No evidence of dissection, stenosis (50% or greater) or occlusion. Non stenotic calcific atherosclerosis of carotid bifurcation. Left carotid system: Moderate 60% proximal left ICA stenosis with calcified plaque. No additional evidence for stenosis, dissection, or occlusion of left carotid system. Vertebral arteries: Right dominant.  No evidence of dissection, stenosis (50% or greater) or occlusion. Skeleton: No acute finding. Multilevel flowing anterior ossification compatible with DISH. Other neck: 8 mm nodule in the right lobe of the thyroid gland. Upper chest: Negative. Review of the MIP images confirms the above findings CTA HEAD FINDINGS Anterior circulation: No significant stenosis, proximal occlusion, aneurysm, or vascular malformation. Non stenotic calcific atherosclerosis of internal carotid arteries. Posterior circulation: No significant stenosis, proximal occlusion, aneurysm, or vascular malformation. Venous sinuses: As permitted by contrast timing, patent. Anatomic variants: None significant. Review of the MIP images confirms the above findings IMPRESSION: 1. Patent carotid and vertebral arteries. 2. Moderate 60% proximal left ICA stenosis with calcified plaque. No additional findings for dissection, aneurysm, or hemodynamically significant stenosis utilizing NASCET criteria. 3. Patent Circle of Willis. No intracranial large vessel occlusion, aneurysm, or significant stenosis. Electronically Signed   By: Kristine Garbe M.D.   On: 11/24/2018 22:42   Dg Chest Port 1 View  Result Date: 11/24/2018 CLINICAL DATA:  85 year old male with acute onset visual changes around 1645 hours. Shortness of breath. EXAM: PORTABLE CHEST 1 VIEW COMPARISON:  Chest radiographs 03/31/2010 and  earlier. FINDINGS: Portable AP upright view at 2107 hours. Similar low lung volumes and cardiomegaly. Chronic right chest cardiac pacemaker. Visualized tracheal air column is within normal limits. Allowing for portable technique the lungs are clear. No pneumothorax, pleural effusion or confluent pulmonary opacity. Paucity of bowel gas in the upper abdomen. No acute osseous abnormality identified. IMPRESSION: Low lung volumes.  No acute cardiopulmonary abnormality. Electronically Signed   By: Genevie Ann M.D.   On: 11/24/2018 21:16   Vas US Carotid  Result  Date: 11/25/2018 Carotid Arterial Duplex Study Indications:       CVA and CTA report Lt ICA >60%. Comparison Study:  no prior Performing Technologist: June Leap RDMS, RVT  Examination Guidelines: A complete evaluation includes B-mode imaging, spectral Doppler, color Doppler, and power Doppler as needed of all accessible portions of each vessel. Bilateral testing is considered an integral part of a complete examination. Limited examinations for reoccurring indications may be performed as noted.  Right Carotid Findings: +----------+--------+--------+--------+------------+--------+           PSV cm/sEDV cm/sStenosisDescribe    Comments +----------+--------+--------+--------+------------+--------+ CCA Prox  78      10              heterogenous         +----------+--------+--------+--------+------------+--------+ CCA Distal60      14                                   +----------+--------+--------+--------+------------+--------+ ICA Prox  93      19      1-39%   heterogenous         +----------+--------+--------+--------+------------+--------+ ICA Distal69      16                                   +----------+--------+--------+--------+------------+--------+ ECA       59      7                                    +----------+--------+--------+--------+------------+--------+ +----------+--------+-------+--------------+-------------------+           PSV cm/sEDV cmsDescribe      Arm Pressure (mmHG) +----------+--------+-------+--------------+-------------------+ Subclavian               Not identified                    +----------+--------+-------+--------------+-------------------+ +---------+--------+--+--------+--+---------+ VertebralPSV cm/s40EDV cm/s13Antegrade +---------+--------+--+--------+--+---------+  Left Carotid Findings: +----------+--------+--------+--------+------------+--------+           PSV cm/sEDV cm/sStenosisDescribe    Comments  +----------+--------+--------+--------+------------+--------+ CCA Prox  68      14                                   +----------+--------+--------+--------+------------+--------+ CCA Distal67      10                                   +----------+--------+--------+--------+------------+--------+ ICA Prox  150     42      40-59%  heterogenous         +----------+--------+--------+--------+------------+--------+ ICA Distal55      14                                   +----------+--------+--------+--------+------------+--------+  ECA       69      10                                   +----------+--------+--------+--------+------------+--------+ +----------+--------+--------+--------------+-------------------+ SubclavianPSV cm/sEDV cm/sDescribe      Arm Pressure (mmHG) +----------+--------+--------+--------------+-------------------+                           Not identified                    +----------+--------+--------+--------------+-------------------+ +---------+--------+--+--------+--+---------+ VertebralPSV cm/s44EDV cm/s11Antegrade +---------+--------+--+--------+--+---------+  Summary: Right Carotid: Velocities in the right ICA are consistent with a 1-39% stenosis. Left Carotid: Velocities in the left ICA are consistent with a 40-59% stenosis. Vertebrals: Bilateral vertebral arteries demonstrate antegrade flow. *See table(s) above for measurements and observations.     Preliminary     Microbiology: Recent Results (from the past 240 hour(s))  SARS Coronavirus 2 (CEPHEID - Performed in [place] hospital lab), Hosp Order     Status: None   Collection Time: 11/24/18  8:35 PM   Specimen: Nasopharyngeal Swab  Result Value Ref Range Status   SARS Coronavirus 2 NEGATIVE NEGATIVE Final    Comment: (NOTE) If result is NEGATIVE SARS-CoV-2 target nucleic acids are NOT DETECTED. The SARS-CoV-2 RNA is generally detectable in upper and lower  respiratory  specimens during the acute phase of infection. The lowest  concentration of SARS-CoV-2 viral copies this assay can detect is 250  copies / mL. A negative result does not preclude SARS-CoV-2 infection  and should not be used as the sole basis for treatment or other  patient management decisions.  A negative result may occur with  improper specimen collection / handling, submission of specimen other  than nasopharyngeal swab, presence of viral mutation(s) within the  areas targeted by this assay, and inadequate number of viral copies  (<250 copies / mL). A negative result must be combined with clinical  observations, patient history, and epidemiological information. If result is POSITIVE SARS-CoV-2 target nucleic acids are DETECTED. The SARS-CoV-2 RNA is generally detectable in upper and lower  respiratory specimens dur ing the acute phase of infection.  Positive  results are indicative of active infection with SARS-CoV-2.  Clinical  correlation with patient history and other diagnostic information is  necessary to determine patient infection status.  Positive results do  not rule out bacterial infection or co-infection with other viruses. If result is PRESUMPTIVE POSTIVE SARS-CoV-2 nucleic acids MAY BE PRESENT.   A presumptive positive result was obtained on the submitted specimen  and confirmed on repeat testing.  While 2019 novel coronavirus  (SARS-CoV-2) nucleic acids may be present in the submitted sample  additional confirmatory testing may be necessary for epidemiological  and / or clinical management purposes  to differentiate between  SARS-CoV-2 and other Sarbecovirus currently known to infect humans.  If clinically indicated additional testing with an alternate test  methodology [phone]) is advised. The SARS-CoV-2 RNA is generally  detectable in upper and lower respiratory sp ecimens during the acute  phase of infection. The expected result is Negative. Fact Sheet for  Patients:  StrictlyIdeas.no Fact Sheet for Healthcare Providers: BankingDealers.co.za This test is not yet approved or cleared by the [place] FDA and has been authorized for detection and/or diagnosis of SARS-CoV-2 by FDA under an Emergency Use Authorization (EUA).  This EUA will remain  in effect (meaning this test can be used) for the duration of the COVID-19 declaration under Section 564(b)(1) of the Act, 21 U.S.C. section 360bbb-3(b)(1), unless the authorization is terminated or revoked sooner. Performed at [facility], [place] [address] [place], [place] [address]      Labs: Basic Metabolic Panel: Recent Labs  Lab 11/24/18 1754 11/24/18 1802 11/25/18 1052 11/26/18 0405  NA 139 139 138 140  K 4.5 4.5 4.6 4.0  CL 106 106 103 104  CO2 23  --  26 26  GLUCOSE 136* 136* 161* 89  BUN 35* 39* 28* 26*  CREATININE 1.91* 1.90* 1.70* 1.83*  CALCIUM 9.1  --  8.9 8.9   Liver Function Tests: Recent Labs  Lab 11/24/18 1754  AST 21  ALT 22  ALKPHOS 45  BILITOT 0.7  PROT 6.9  ALBUMIN 3.7   No results for input(s): LIPASE, AMYLASE in the last 168 hours. No results for input(s): AMMONIA in the last 168 hours. CBC: Recent Labs  Lab 11/24/18 1754 11/24/18 1802 11/26/18 0405  WBC 8.6  --  7.7  NEUTROABS 5.8  --   --   HGB 16.2 16.7 15.4  HCT 50.7 49.0 47.2  MCV 94.4  --  92.0  PLT 147*  --  142*   Cardiac Enzymes: No results for input(s): CKTOTAL, CKMB, CKMBINDEX, TROPONINI in the last 168 hours. BNP: BNP (last 3 results) No results for input(s): BNP in the last 8760 hours.  ProBNP (last 3 results) No results for input(s): PROBNP in the last 8760 hours.  CBG: Recent Labs  Lab 11/25/18 0830 11/25/18 1204 11/25/18 1733 11/25/18 2145 11/26/18 0817  GLUCAP 98 161* 223* 160* 84       Signed:  Radene Gunning NP  [facility] 11/26/2018, 11:20 AM   Patient was seen, examined,treatment plan was  discussed with the Advance Practice Provider.  I have personally reviewed the clinical findings, labs, EKG, imaging studies and management of this patient in detail. I have also reviewed the orders written for this patient which were under my direction. I agree with the documentation, as recorded by the Advance Practice Provider.   [name] is a 85 y.o. male here with transient neurologic deficit.  CT scan x 2 negative.  Will need monitoring of left carotid artery stenosis progression.   Geradine Girt, DO  How to contact the [facility] Attending or Consulting provider [place] or covering provider during after hours [place], for this patient?  1. Check the care team in [facility] and look for a) attending/consulting [facility] provider listed and b) the [facility] team listed 2. Log into [email or web address] and use [place] universal password to access. If you do not have the password, please contact the hospital operator. 3. Locate the [facility] provider you are looking for under [facility] and page to a number that you can be directly reached. 4. If you still have difficulty reaching the provider, please page the [facility] (Director on Call) for the Hospitalists listed on amion for assistance.

## 2018-11-26 NOTE — Progress Notes (Signed)
Physical Therapy Treatment Patient Details Name: [name] MRN: [id number] DOB: 22-Sep-1931 Today's Date: 11/26/2018    History of Present Illness [name] is a 85 y.o. male with medical history significant for coronary artery disease, ischemic cardiomyopathy, pacemaker in place, insulin-dependent diabetes mellitus, chronic kidney disease stage III-IV, hypothyroidism, and OSA with CPAP intolerance, now presenting to the emergency department for evaluation of confusion and word finding difficulty.Noncontrast head CT is negative for acute intracranial abnormality. Pt dx with TIA, sinus node dysfunction (pacemaker is being checked).      PT Comments    Pt took 2 walks in the hall - one with O2 and one without.  Pt does not need O2 at DC.  Pt educated on breathing strategies.  Pt can benefit from [facility] services to h elp him figure out safe HEP/walking program to do going forward.   Follow Up Recommendations  Home health PT;Supervision for mobility/OOB     Equipment Recommendations  None recommended by PT    Recommendations for Other Services       Precautions / Restrictions Precautions Precautions: ICD/Pacemaker;Fall Precaution Comments: pt has h/o falls per pt due to diabetic neuropathy Restrictions Weight Bearing Restrictions: No    Mobility  Bed Mobility Overal bed mobility: Independent                Transfers Overall transfer level: Modified independent Equipment used: None Transfers: Sit to/from Stand Sit to Stand: Modified independent (Device/Increase time);From elevated surface         General transfer comment: Educated pt in concept of easier to get up from elevated surfaces and ideas of making  home surfaces higher  Ambulation/Gait Ambulation/Gait assistance: Supervision Gait Distance (Feet): 200 Feet Assistive device: None Gait Pattern/deviations: Step-through pattern;Staggering left;Staggering right     General Gait Details: Today pt walked 200  feet on RA - ending pulse ox 89% pulse 104bpm and 2/4 dyspnea.  Pt with one loss of balance -due to neuropathy but able to self correct.  pt has low back pain when walking (he reports he got orthothotics 3 years ago that took away this back pain - i encouraged him to get new ones).  Pt rested and then walked 200 feet with 2L O2 - ending pulse ox 91% and pulse 106bpm and 2/4 dyspnea.  O2 did not seem to help pt when walking - he agreed.  Pt educated on slow walking, not talking when walking (hard for him) and focus on deep breathing.   Stairs             Wheelchair Mobility    Modified Rankin (Stroke Patients Only)       Balance Overall balance assessment: Mild deficits observed, not formally tested     Sitting balance - Comments: needs extra time and energy, but can sit forward and donn shoes and socks with legs crossed.                                     Cognition Arousal/Alertness: Awake/alert Behavior During Therapy: WFL for tasks assessed/performed                                          Exercises      General Comments        Pertinent Vitals/Pain Pain Assessment: No/denies pain  Home Living                      Prior Function            PT Goals (current goals can now be found in the care plan section) Progress towards PT goals: Progressing toward goals    Frequency    Min 3X/week      PT Plan Current plan remains appropriate    Co-evaluation              AM-PAC PT "6 Clicks" Mobility   Outcome Measure  Help needed turning from your back to your side while in a flat bed without using bedrails?: None Help needed moving from lying on your back to sitting on the side of a flat bed without using bedrails?: None Help needed moving to and from a bed to a chair (including a wheelchair)?: None Help needed standing up from a chair using your arms (e.g., wheelchair or bedside chair)?: None Help needed  to walk in hospital room?: A Little Help needed climbing 3-5 steps with a railing? : A Little 6 Click Score: 22    End of Session Equipment Utilized During Treatment: Oxygen Activity Tolerance: Patient tolerated treatment well Patient left: in chair;with call bell/phone within reach Nurse Communication: Mobility status PT Visit Diagnosis: Muscle weakness (generalized) (M62.81);History of falling (Z91.81);Difficulty in walking, not elsewhere classified (R26.2)     Time: 1110-1140 PT Time Calculation (min) (ACUTE ONLY): 30 min  Charges:  $Gait Training: 23-37 mins                     11/26/2018   Rande Lawman, PT    Loyal Buba 11/26/2018, 11:51 AM

## 2018-11-26 NOTE — Progress Notes (Signed)
STROKE TEAM PROGRESS NOTE   INTERVAL HISTORY Patient is neurologically stable without recurrent TIA or stroke symptoms.  Carotid ultrasound shows only 40 to 59% left ICA stenosis which is less than CT angiogram which had shown 60% stenosis.  I recommend medical management for TIA and stroke prevention and conservative 6 monthly follow-up carotid ultrasound studies.  2D echo showed normal ejection fraction.  Pacemaker interrogation reveals no atrial fibrillation Vitals:   11/25/18 1900 11/25/18 1945 11/26/18 0044 11/26/18 0452  BP:  139/69 (!) 122/58 111/65  Pulse: 78 79 81 73  Resp: 20 17 18    Temp:  98.2 F (36.8 C) 97.9 F (36.6 C) 98.4 F (36.9 C)  TempSrc:  Oral Oral Other (Comment)  SpO2: 94% (!) 87% 90% 90%  Weight:        CBC:  Recent Labs  Lab 11/24/18 1754 11/24/18 1802 11/26/18 0405  WBC 8.6  --  7.7  NEUTROABS 5.8  --   --   HGB 16.2 16.7 15.4  HCT 50.7 49.0 47.2  MCV 94.4  --  92.0  PLT 147*  --  142*    Basic Metabolic Panel:  Recent Labs  Lab 11/25/18 1052 11/26/18 0405  NA 138 140  K 4.6 4.0  CL 103 104  CO2 26 26  GLUCOSE 161* 89  BUN 28* 26*  CREATININE 1.70* 1.83*  CALCIUM 8.9 8.9   Lipid Panel:     Component Value Date/Time   CHOL 156 11/24/2018 2337   TRIG 236 (H) 11/24/2018 2337   HDL 35 (L) 11/24/2018 2337   CHOLHDL 4.5 11/24/2018 2337   VLDL 47 (H) 11/24/2018 2337   LDLCALC 74 11/24/2018 2337   HgbA1c:  Lab Results  Component Value Date   HGBA1C 7.0 (H) 11/24/2018   Urine Drug Screen: No results found for: LABOPIA, COCAINSCRNUR, LABBENZ, AMPHETMU, THCU, LABBARB  Alcohol Level No results found for: ETH  IMAGING Ct Angio Head W Or Wo Contrast  Result Date: 11/24/2018 CLINICAL DATA:  85 y/o  M; word-finding difficulty and confusion. EXAM: CT ANGIOGRAPHY HEAD AND NECK TECHNIQUE: Multidetector CT imaging of the head and neck was performed using the standard protocol during bolus administration of intravenous contrast. Multiplanar CT  image reconstructions and MIPs were obtained to evaluate the vascular anatomy. Carotid stenosis measurements (when applicable) are obtained utilizing NASCET criteria, using the distal internal carotid diameter as the denominator. CONTRAST:  53mL OMNIPAQUE IOHEXOL 350 MG/ML SOLN COMPARISON:  11/24/2018 CT head. FINDINGS: CTA NECK FINDINGS Aortic arch: Bovine variant branching. Imaged portion shows no evidence of aneurysm or dissection. No significant stenosis of the major arch vessel origins. Mild calcific atherosclerosis. Right carotid system: No evidence of dissection, stenosis (50% or greater) or occlusion. Non stenotic calcific atherosclerosis of carotid bifurcation. Left carotid system: Moderate 60% proximal left ICA stenosis with calcified plaque. No additional evidence for stenosis, dissection, or occlusion of left carotid system. Vertebral arteries: Right dominant. No evidence of dissection, stenosis (50% or greater) or occlusion. Skeleton: No acute finding. Multilevel flowing anterior ossification compatible with DISH. Other neck: 8 mm nodule in the right lobe of the thyroid gland. Upper chest: Negative. Review of the MIP images confirms the above findings CTA HEAD FINDINGS Anterior circulation: No significant stenosis, proximal occlusion, aneurysm, or vascular malformation. Non stenotic calcific atherosclerosis of internal carotid arteries. Posterior circulation: No significant stenosis, proximal occlusion, aneurysm, or vascular malformation. Venous sinuses: As permitted by contrast timing, patent. Anatomic variants: None significant. Review of the MIP images confirms  the above findings IMPRESSION: 1. Patent carotid and vertebral arteries. 2. Moderate 60% proximal left ICA stenosis with calcified plaque. No additional findings for dissection, aneurysm, or hemodynamically significant stenosis utilizing NASCET criteria. 3. Patent Circle of Willis. No intracranial large vessel occlusion, aneurysm, or  significant stenosis. Electronically Signed   By: Kristine Garbe M.D.   On: 11/24/2018 22:42   Ct Head Wo Contrast  Result Date: 11/26/2018 CLINICAL DATA:  Stroke follow-up. Episode of transient word-finding difficulties and confusion EXAM: CT HEAD WITHOUT CONTRAST TECHNIQUE: Contiguous axial images were obtained from the base of the skull through the vertex without intravenous contrast. COMPARISON:  CT head and neck 11/24/2018 FINDINGS: Brain: Mild atrophy and white matter changes are stable. No acute cortical infarct, hemorrhage, or mass lesion is present. Basal ganglia are intact. Insular ribbon is normal. The ventricles are proportionate to the degree of atrophy. No significant extraaxial fluid collection is present. The brainstem and cerebellum are within normal limits. Vascular: Atherosclerotic calcifications are present within the cavernous internal carotid arteries bilaterally. There is no hyperdense vessel. Skull: Calvarium is intact. No focal lytic or blastic lesions are present. Sinuses/Orbits: Chronic right maxillary sinus disease is again noted. There is chronic wall thickening. Mild diffuse mucosal thickening is present throughout the ethmoid air cells and inferior frontal sinuses bilaterally. Sphenoid sinuses and mastoid air cells are clear. Bilateral lens replacements are noted. Globes and orbits are otherwise unremarkable. IMPRESSION: 1. No acute intracranial abnormality or significant interval change. 2. Stable mild atrophy and white matter disease. 3. Chronic right maxillary sinus disease. Electronically Signed   By: San Morelle M.D.   On: 11/26/2018 05:18   Ct Head Wo Contrast  Result Date: 11/24/2018 CLINICAL DATA:  Possible TIA.  Mental status changes for 30 minutes. EXAM: CT HEAD WITHOUT CONTRAST TECHNIQUE: Contiguous axial images were obtained from the base of the skull through the vertex without intravenous contrast. COMPARISON:  None. FINDINGS: Brain: Cerebral and  cerebellar atrophy are felt to be within normal variation for age. Moderate low density in the periventricular white matter likely related to small vessel disease. No mass lesion, hemorrhage, hydrocephalus, acute infarct, intra-axial, or extra-axial fluid collection. Vascular: No hyperdense vessel or unexpected calcification. Skull: Normal Sinuses/Orbits: Bilateral cataracts extractions. Mucosal thickening of the right maxillary sinus. Other: None. IMPRESSION: 1.  No acute intracranial abnormality. 2.  Cerebral atrophy and small vessel ischemic change. Electronically Signed   By: Abigail Miyamoto M.D.   On: 11/24/2018 20:16   Ct Angio Neck W Or Wo Contrast  Result Date: 11/24/2018 CLINICAL DATA:  85 y/o  M; word-finding difficulty and confusion. EXAM: CT ANGIOGRAPHY HEAD AND NECK TECHNIQUE: Multidetector CT imaging of the head and neck was performed using the standard protocol during bolus administration of intravenous contrast. Multiplanar CT image reconstructions and MIPs were obtained to evaluate the vascular anatomy. Carotid stenosis measurements (when applicable) are obtained utilizing NASCET criteria, using the distal internal carotid diameter as the denominator. CONTRAST:  31mL OMNIPAQUE IOHEXOL 350 MG/ML SOLN COMPARISON:  11/24/2018 CT head. FINDINGS: CTA NECK FINDINGS Aortic arch: Bovine variant branching. Imaged portion shows no evidence of aneurysm or dissection. No significant stenosis of the major arch vessel origins. Mild calcific atherosclerosis. Right carotid system: No evidence of dissection, stenosis (50% or greater) or occlusion. Non stenotic calcific atherosclerosis of carotid bifurcation. Left carotid system: Moderate 60% proximal left ICA stenosis with calcified plaque. No additional evidence for stenosis, dissection, or occlusion of left carotid system. Vertebral arteries: Right dominant. No evidence  of dissection, stenosis (50% or greater) or occlusion. Skeleton: No acute finding. Multilevel  flowing anterior ossification compatible with DISH. Other neck: 8 mm nodule in the right lobe of the thyroid gland. Upper chest: Negative. Review of the MIP images confirms the above findings CTA HEAD FINDINGS Anterior circulation: No significant stenosis, proximal occlusion, aneurysm, or vascular malformation. Non stenotic calcific atherosclerosis of internal carotid arteries. Posterior circulation: No significant stenosis, proximal occlusion, aneurysm, or vascular malformation. Venous sinuses: As permitted by contrast timing, patent. Anatomic variants: None significant. Review of the MIP images confirms the above findings IMPRESSION: 1. Patent carotid and vertebral arteries. 2. Moderate 60% proximal left ICA stenosis with calcified plaque. No additional findings for dissection, aneurysm, or hemodynamically significant stenosis utilizing NASCET criteria. 3. Patent Circle of Willis. No intracranial large vessel occlusion, aneurysm, or significant stenosis. Electronically Signed   By: Kristine Garbe M.D.   On: 11/24/2018 22:42   Dg Chest Port 1 View  Result Date: 11/24/2018 CLINICAL DATA:  85 year old male with acute onset visual changes around 1645 hours. Shortness of breath. EXAM: PORTABLE CHEST 1 VIEW COMPARISON:  Chest radiographs 03/31/2010 and earlier. FINDINGS: Portable AP upright view at 2107 hours. Similar low lung volumes and cardiomegaly. Chronic right chest cardiac pacemaker. Visualized tracheal air column is within normal limits. Allowing for portable technique the lungs are clear. No pneumothorax, pleural effusion or confluent pulmonary opacity. Paucity of bowel gas in the upper abdomen. No acute osseous abnormality identified. IMPRESSION: Low lung volumes.  No acute cardiopulmonary abnormality. Electronically Signed   By: Genevie Ann M.D.   On: 11/24/2018 21:16   Vas US Carotid  Result Date: 11/26/2018 Carotid Arterial Duplex Study Indications:       CVA and CTA report Lt ICA >60%.  Comparison Study:  no prior Performing Technologist: June Leap RDMS, RVT  Examination Guidelines: A complete evaluation includes B-mode imaging, spectral Doppler, color Doppler, and power Doppler as needed of all accessible portions of each vessel. Bilateral testing is considered an integral part of a complete examination. Limited examinations for reoccurring indications may be performed as noted.  Right Carotid Findings: +----------+--------+--------+--------+------------+--------+           PSV cm/sEDV cm/sStenosisDescribe    Comments +----------+--------+--------+--------+------------+--------+ CCA Prox  78      10              heterogenous         +----------+--------+--------+--------+------------+--------+ CCA Distal60      14                                   +----------+--------+--------+--------+------------+--------+ ICA Prox  93      19      1-39%   heterogenous         +----------+--------+--------+--------+------------+--------+ ICA Distal69      16                                   +----------+--------+--------+--------+------------+--------+ ECA       59      7                                    +----------+--------+--------+--------+------------+--------+ +----------+--------+-------+--------------+-------------------+           PSV cm/sEDV cmsDescribe      Arm  Pressure (mmHG) +----------+--------+-------+--------------+-------------------+ Subclavian               Not identified                    +----------+--------+-------+--------------+-------------------+ +---------+--------+--+--------+--+---------+ VertebralPSV cm/s40EDV cm/s13Antegrade +---------+--------+--+--------+--+---------+  Left Carotid Findings: +----------+--------+--------+--------+------------+--------+           PSV cm/sEDV cm/sStenosisDescribe    Comments +----------+--------+--------+--------+------------+--------+ CCA Prox  68      14                                    +----------+--------+--------+--------+------------+--------+ CCA Distal67      10                                   +----------+--------+--------+--------+------------+--------+ ICA Prox  150     42      40-59%  heterogenous         +----------+--------+--------+--------+------------+--------+ ICA Distal55      14                                   +----------+--------+--------+--------+------------+--------+ ECA       69      10                                   +----------+--------+--------+--------+------------+--------+ +----------+--------+--------+--------------+-------------------+ SubclavianPSV cm/sEDV cm/sDescribe      Arm Pressure (mmHG) +----------+--------+--------+--------------+-------------------+                           Not identified                    +----------+--------+--------+--------------+-------------------+ +---------+--------+--+--------+--+---------+ VertebralPSV cm/s44EDV cm/s11Antegrade +---------+--------+--+--------+--+---------+  Summary: Right Carotid: Velocities in the right ICA are consistent with a 1-39% stenosis. Left Carotid: Velocities in the left ICA are consistent with a 40-59% stenosis. Vertebrals: Bilateral vertebral arteries demonstrate antegrade flow. *See table(s) above for measurements and observations.  Electronically signed by Antony Contras MD on 11/26/2018 at 12:02:02 PM.    Final     PHYSICAL EXAM Obese elderly Caucasian male not in distress. . Afebrile. Head is nontraumatic. Neck is supple without bruit.    Cardiac exam no murmur or gallop. Lungs are clear to auscultation. Distal pulses are well felt. Neurological Exam ;  Awake  Alert oriented x 3. Normal speech and language.eye movements full without nystagmus.fundi were not visualized. Vision acuity and fields appear normal. Hearing is normal. Palatal movements are normal. Face symmetric. Tongue midline. Normal strength, tone, reflexes and  coordination. Normal sensation. Gait deferred.  ASSESSMENT/PLAN Mr. [name] is a 85 y.o. male with history of coronary artery disease, ischemic cardiomyopathy, insulin-dependent diabetes mellitus, chronic kidney disease stage III-IV, hypothyroidism, and OSA with CPAP intolerance presenting with confusion and word finding difficulty.   Small left brainStroke not seen on Ct  vs TIA:  L brain in setting of possible ICA stenosis. Need to confirm both stroke and stenosis  CT head No acute abnormality. Small vessel disease. Atrophy.   CTA head & neck L ICA 60%  Repeat CT head in am pending   Carotid doppler 40 to 59% left internal carotid stenosis   2D Echo normal ejection  fraction.  LDL 74  HgbA1c 7.1  Heparin 5000 units sq tid for VTE prophylaxis  aspirin 81 mg daily and clopidogrel 75 mg daily prior to admission, now on aspirin 81 mg daily and clopidogrel 75 mg daily. Continue DAPT at dc  Therapy recommendations:  HH OT Disposition:  home Orthostatic Hypotension Hx Hypertension  home meds adjusted by cardiologist  Stable . Permissive hypertension (OK if < 220/120) but gradually normalize in 5-7 days . Long-term BP goal normotensive  Hyperlipidemia  Home meds:  crestor 40 and fish oil, resumed in hospital  LDL 74, goal < 70  Continue statin at discharge  Diabetes type II Uncontrolled  HgbA1c 7.1, goal < 7.0  Other Stroke Risk Factors  Advanced age  Former smokeless tobacco user  ETOH use, advised to drink no more than 2 drink(s) a day  Morbid Obesity, Body mass index is 41.02 kg/m., recommend weight loss, diet and exercise as appropriate   CAD  Obstructive sleep apnea w/ CPAP intolerance  Chronic combined systolic and diastolic CHF  Other Active Problems  Sinus node dysfunction w/ [employer] Pacer  CKD stage III-IV  hypothyroidism  Hospital day # 0     He presented with transient confusion and word finding difficulties possibly left hemispheric  TIA.  CT angios shows 60% left carotid stenosis but carotid ultrasound shows only 40 to 59% left ICA stenosis hence do not recommend revascularization at the present time recommend dual antiplatelet therapy and aggressive risk factor modification.  Follow-up as an outpatient in the stroke clinic in 6 weeks.  He will need 6 monthly follow-up carotid ultrasound studies.  Greater than 50% time during this 25-minute visit was spent on counseling and coordination of care about his TIA and carotid stenosis and answering questions.  Discussed with Dr. Theresa Duty, MD Medical Director [place] Pager: [phone] 11/26/2018 1:20 PM   To contact Stroke Continuity provider, please refer to [email or web address] After hours, contact General Neurology

## 2018-11-26 NOTE — Discharge Planning (Signed)
Nsg Discharge Note  Admit Date:  11/24/2018 Discharge date: 11/26/2018   Jordy Verba to be D/C'd Home per MD order.  AVS completed.    Discharge Medication: Allergies as of 11/26/2018   No Known Allergies     Medication List    TAKE these medications   acetaminophen 500 MG tablet Commonly known as: TYLENOL Take 1,000 mg by mouth daily as needed for mild pain.   aspirin 81 MG tablet Take 81 mg by mouth daily.   calcitRIOL 0.25 MCG capsule Commonly known as: ROCALTROL Take 0.25 mcg by mouth every Monday, Wednesday, and Friday.   clopidogrel 75 MG tablet Commonly known as: PLAVIX Take 75 mg by mouth daily.   COQ10 150 MG Caps Take 1 tablet by mouth daily.   docusate sodium 100 MG capsule Commonly known as: COLACE Take 200 mg by mouth daily.   Fish Oil 1000 MG Caps Take 2,400 mg by mouth daily.   insulin NPH-regular Human (70-30) 100 UNIT/ML injection Inject 65 Units into the skin 2 (two) times daily with a meal.   levothyroxine 25 MCG tablet Commonly known as: SYNTHROID Take 25 mcg by mouth daily.   metoprolol succinate 25 MG 24 hr tablet Commonly known as: TOPROL-XL TAKE 1 & 1/2 (ONE & ONE-HALF) TABLETS BY MOUTH DAILY What changed: See the new instructions.   nitroGLYCERIN 0.4 MG SL tablet Commonly known as: NITROSTAT Place under the tongue.   pantoprazole 40 MG tablet Commonly known as: PROTONIX Take 40 mg by mouth daily.   rosuvastatin 40 MG tablet Commonly known as: CRESTOR Take 40 mg by mouth daily.   vitamin C 500 MG tablet Commonly known as: ASCORBIC ACID Take 500 mg by mouth daily.   Vitamin D-1000 Max St 25 MCG (1000 UT) tablet Generic drug: Cholecalciferol Take 2,000 Units by mouth daily.   vitamin E 400 UNIT capsule Take 400 Units by mouth daily.            Durable Medical Equipment  (From admission, onward)         Start     Ordered   11/26/18 0814  For home use only DME oxygen  Once    Question Answer Comment  Length of Need  6 Months   Mode or (Route) Nasal cannula   Liters per Minute 2   Frequency Continuous (stationary and portable oxygen unit needed)   Oxygen delivery system Gas      11/26/18 0813          Discharge Assessment: Vitals:   11/26/18 0044 11/26/18 0452  BP: (!) 122/58 111/65  Pulse: 81 73  Resp: 18   Temp: 97.9 F (36.6 C) 98.4 F (36.9 C)  SpO2: 90% 90%   Skin clean, dry and intact without evidence of skin break down, no evidence of skin tears noted. IV catheter discontinued intact. Site without signs and symptoms of complications - no redness or edema noted at insertion site, patient denies c/o pain - only slight tenderness at site.  Dressing with slight pressure applied.  D/c Instructions-Education: Discharge instructions given to patient/family with verbalized understanding. D/c education completed with patient/family including follow up instructions, medication list, d/c activities limitations if indicated, with other d/c instructions as indicated by MD - patient able to verbalize understanding, all questions fully answered. Patient instructed to return to ED, call 911, or call MD for any changes in condition.  Patient escorted via [place], and D/C home via private auto.  Hiram Comber, RN 11/26/2018  12:39 PM

## 2018-11-27 LAB — CUP PACEART REMOTE DEVICE CHECK
Battery Remaining Longevity: 4 mo
Battery Remaining Percentage: 3 %
Battery Voltage: 2.65 V
Brady Statistic AP VP Percent: 5.4 %
Brady Statistic AP VS Percent: 1 %
Brady Statistic AS VP Percent: 92 %
Brady Statistic AS VS Percent: 2.5 %
Brady Statistic RA Percent Paced: 5.3 %
Brady Statistic RV Percent Paced: 97 %
Date Time Interrogation Session: 20200707235037
Implantable Lead Implant Date: 19901207
Implantable Lead Implant Date: 20111110
Implantable Lead Location: 753862
Implantable Lead Location: 753862
Implantable Pulse Generator Implant Date: 20111110
Lead Channel Impedance Value: 280 Ohm
Lead Channel Impedance Value: 540 Ohm
Lead Channel Pacing Threshold Amplitude: 0.875 V
Lead Channel Pacing Threshold Amplitude: 1 V
Lead Channel Pacing Threshold Pulse Width: 0.4 ms
Lead Channel Pacing Threshold Pulse Width: 0.6 ms
Lead Channel Sensing Intrinsic Amplitude: 12 mV
Lead Channel Sensing Intrinsic Amplitude: 3.7 mV
Lead Channel Setting Pacing Amplitude: 1.25 V
Lead Channel Setting Pacing Amplitude: 1.875
Lead Channel Setting Pacing Pulse Width: 0.6 ms
Lead Channel Setting Sensing Sensitivity: 3 mV
Pulse Gen Model: 2210
Pulse Gen Serial Number: 7174903

## 2018-11-29 DIAGNOSIS — N184 Chronic kidney disease, stage 4 (severe): Secondary | ICD-10-CM | POA: Diagnosis not present

## 2018-11-29 DIAGNOSIS — E1122 Type 2 diabetes mellitus with diabetic chronic kidney disease: Secondary | ICD-10-CM | POA: Diagnosis not present

## 2018-11-29 DIAGNOSIS — Z950 Presence of cardiac pacemaker: Secondary | ICD-10-CM | POA: Diagnosis not present

## 2018-11-29 DIAGNOSIS — Z87440 Personal history of urinary (tract) infections: Secondary | ICD-10-CM | POA: Diagnosis not present

## 2018-11-29 DIAGNOSIS — Z794 Long term (current) use of insulin: Secondary | ICD-10-CM | POA: Diagnosis not present

## 2018-11-29 DIAGNOSIS — G4733 Obstructive sleep apnea (adult) (pediatric): Secondary | ICD-10-CM | POA: Diagnosis not present

## 2018-11-29 DIAGNOSIS — I5042 Chronic combined systolic (congestive) and diastolic (congestive) heart failure: Secondary | ICD-10-CM | POA: Diagnosis not present

## 2018-11-29 DIAGNOSIS — E782 Mixed hyperlipidemia: Secondary | ICD-10-CM | POA: Diagnosis not present

## 2018-11-29 DIAGNOSIS — I255 Ischemic cardiomyopathy: Secondary | ICD-10-CM | POA: Diagnosis not present

## 2018-11-29 DIAGNOSIS — I130 Hypertensive heart and chronic kidney disease with heart failure and stage 1 through stage 4 chronic kidney disease, or unspecified chronic kidney disease: Secondary | ICD-10-CM | POA: Diagnosis not present

## 2018-11-29 DIAGNOSIS — Z7902 Long term (current) use of antithrombotics/antiplatelets: Secondary | ICD-10-CM | POA: Diagnosis not present

## 2018-11-29 DIAGNOSIS — Z8673 Personal history of transient ischemic attack (TIA), and cerebral infarction without residual deficits: Secondary | ICD-10-CM | POA: Diagnosis not present

## 2018-11-29 DIAGNOSIS — R413 Other amnesia: Secondary | ICD-10-CM | POA: Diagnosis not present

## 2018-11-29 DIAGNOSIS — E039 Hypothyroidism, unspecified: Secondary | ICD-10-CM | POA: Diagnosis not present

## 2018-11-29 DIAGNOSIS — I951 Orthostatic hypotension: Secondary | ICD-10-CM | POA: Diagnosis not present

## 2018-11-29 DIAGNOSIS — I2510 Atherosclerotic heart disease of native coronary artery without angina pectoris: Secondary | ICD-10-CM | POA: Diagnosis not present

## 2018-11-29 DIAGNOSIS — Z7982 Long term (current) use of aspirin: Secondary | ICD-10-CM | POA: Diagnosis not present

## 2018-11-29 DIAGNOSIS — Z9181 History of falling: Secondary | ICD-10-CM | POA: Diagnosis not present

## 2018-12-03 ENCOUNTER — Encounter: Payer: Self-pay | Admitting: Cardiology

## 2018-12-03 NOTE — Progress Notes (Signed)
Remote pacemaker transmission.   

## 2018-12-05 ENCOUNTER — Telehealth: Payer: Self-pay | Admitting: *Deleted

## 2018-12-05 NOTE — Telephone Encounter (Signed)
Left a message for the patient to call back.  [name], RN        I have him scheduled 12-27-18 as there are no openings to put him in. He said Dr. Sallyanne Kuster told him to stop taking 3 medications so he is wondering if he should stop taking them and would like a call back regarding this.

## 2018-12-10 DIAGNOSIS — G459 Transient cerebral ischemic attack, unspecified: Secondary | ICD-10-CM | POA: Diagnosis not present

## 2018-12-10 DIAGNOSIS — I6522 Occlusion and stenosis of left carotid artery: Secondary | ICD-10-CM | POA: Diagnosis not present

## 2018-12-12 DIAGNOSIS — Z6841 Body Mass Index (BMI) 40.0 and over, adult: Secondary | ICD-10-CM | POA: Diagnosis not present

## 2018-12-12 DIAGNOSIS — N183 Chronic kidney disease, stage 3 (moderate): Secondary | ICD-10-CM | POA: Diagnosis not present

## 2018-12-12 DIAGNOSIS — E1142 Type 2 diabetes mellitus with diabetic polyneuropathy: Secondary | ICD-10-CM | POA: Diagnosis not present

## 2018-12-12 DIAGNOSIS — Z794 Long term (current) use of insulin: Secondary | ICD-10-CM | POA: Diagnosis not present

## 2018-12-12 DIAGNOSIS — E039 Hypothyroidism, unspecified: Secondary | ICD-10-CM | POA: Diagnosis not present

## 2018-12-19 NOTE — Telephone Encounter (Signed)
Left a message for the patient to call back.  Patient has an appointment on 12/27/2018.

## 2018-12-19 NOTE — Telephone Encounter (Signed)
Left a third message for the patient to call back. Encounter will be closed.

## 2018-12-26 ENCOUNTER — Ambulatory Visit (INDEPENDENT_AMBULATORY_CARE_PROVIDER_SITE_OTHER): Payer: Medicare Other | Admitting: *Deleted

## 2018-12-26 DIAGNOSIS — I495 Sick sinus syndrome: Secondary | ICD-10-CM

## 2018-12-27 ENCOUNTER — Other Ambulatory Visit: Payer: Self-pay

## 2018-12-27 ENCOUNTER — Telehealth: Payer: Self-pay | Admitting: Cardiovascular Disease

## 2018-12-27 ENCOUNTER — Telehealth: Payer: Self-pay | Admitting: *Deleted

## 2018-12-27 ENCOUNTER — Encounter: Payer: Medicare Other | Admitting: Cardiovascular Disease

## 2018-12-27 ENCOUNTER — Encounter: Payer: Self-pay | Admitting: Cardiovascular Disease

## 2018-12-27 ENCOUNTER — Telehealth (INDEPENDENT_AMBULATORY_CARE_PROVIDER_SITE_OTHER): Payer: Medicare Other | Admitting: Cardiovascular Disease

## 2018-12-27 VITALS — BP 161/80 | HR 75 | Temp 97.6°F | Wt 256.0 lb

## 2018-12-27 DIAGNOSIS — E1122 Type 2 diabetes mellitus with diabetic chronic kidney disease: Secondary | ICD-10-CM | POA: Diagnosis not present

## 2018-12-27 DIAGNOSIS — I2510 Atherosclerotic heart disease of native coronary artery without angina pectoris: Secondary | ICD-10-CM

## 2018-12-27 DIAGNOSIS — Z794 Long term (current) use of insulin: Secondary | ICD-10-CM

## 2018-12-27 DIAGNOSIS — Z950 Presence of cardiac pacemaker: Secondary | ICD-10-CM | POA: Diagnosis not present

## 2018-12-27 DIAGNOSIS — I10 Essential (primary) hypertension: Secondary | ICD-10-CM

## 2018-12-27 DIAGNOSIS — I441 Atrioventricular block, second degree: Secondary | ICD-10-CM

## 2018-12-27 DIAGNOSIS — N183 Type 2 diabetes mellitus with diabetic chronic kidney disease: Secondary | ICD-10-CM

## 2018-12-27 DIAGNOSIS — I5042 Chronic combined systolic (congestive) and diastolic (congestive) heart failure: Secondary | ICD-10-CM | POA: Diagnosis not present

## 2018-12-27 DIAGNOSIS — I495 Sick sinus syndrome: Secondary | ICD-10-CM

## 2018-12-27 DIAGNOSIS — E782 Mixed hyperlipidemia: Secondary | ICD-10-CM

## 2018-12-27 LAB — CUP PACEART REMOTE DEVICE CHECK
Battery Remaining Longevity: 1 mo
Battery Remaining Percentage: 0.5 %
Battery Voltage: 2.62 V
Brady Statistic AP VP Percent: 45 %
Brady Statistic AP VS Percent: 1 %
Brady Statistic AS VP Percent: 54 %
Brady Statistic AS VS Percent: 1 %
Brady Statistic RA Percent Paced: 45 %
Brady Statistic RV Percent Paced: 99 %
Date Time Interrogation Session: 20200807071451
Implantable Lead Implant Date: 19901207
Implantable Lead Implant Date: 20111110
Implantable Lead Location: 753862
Implantable Lead Location: 753862
Implantable Pulse Generator Implant Date: 20111110
Lead Channel Impedance Value: 280 Ohm
Lead Channel Impedance Value: 540 Ohm
Lead Channel Pacing Threshold Amplitude: 0.875 V
Lead Channel Pacing Threshold Amplitude: 1 V
Lead Channel Pacing Threshold Pulse Width: 0.4 ms
Lead Channel Pacing Threshold Pulse Width: 0.6 ms
Lead Channel Sensing Intrinsic Amplitude: 12 mV
Lead Channel Sensing Intrinsic Amplitude: 4 mV
Lead Channel Setting Pacing Amplitude: 1.25 V
Lead Channel Setting Pacing Amplitude: 1.875
Lead Channel Setting Pacing Pulse Width: 0.6 ms
Lead Channel Setting Sensing Sensitivity: 3 mV
Pulse Gen Model: 2210
Pulse Gen Serial Number: 7174903

## 2018-12-27 MED ORDER — NITROGLYCERIN 0.4 MG SL SUBL: 0.4000 mg | SUBLINGUAL_TABLET | SUBLINGUAL | 1 refills | Status: DC | PRN

## 2018-12-27 NOTE — Telephone Encounter (Signed)
See below, patient was suppose to be seen today- having issues with pacemaker.

## 2018-12-27 NOTE — Progress Notes (Signed)
Virtual Visit via Video Note   This visit type was conducted due to national recommendations for restrictions regarding the COVID-19 Pandemic (e.g. social distancing) in an effort to limit this patient's exposure and mitigate transmission in our community.  Due to his co-morbid illnesses, this patient is at least at moderate risk for complications without adequate follow up.  This format is felt to be most appropriate for this patient at this time.  All issues noted in this document were discussed and addressed.  A limited physical exam was performed with this format.  Please refer to the patient's chart for his consent to telehealth for [facility].   Date:  12/27/2018   ID:  [name], DOB 02-Jul-1931, MRN [id number]  Patient Location: Home Provider Location: Office  PCP:  [name], MD  Cardiologist:  Sanda Klein, MD  Electrophysiologist:  None   Evaluation Performed:  Follow-Up Visit  Chief Complaint:  F/u hospitalization, pacemaker approaching ERI  History of Present Illness:    [name] is a 85 y.o. 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 The device was initially implanted in 1990, with generator changes in 2002 in 2011. 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  He was hospitalized last month with confusion and difficulty finding words, suspected of having a left hemispheric TIA despite the fact that CT angiography showed a 60% left carotid stenosis, but carotid Doppler suggested a less severe obstruction.  Pacemaker interrogation did not show atrial fibrillation.  Medical therapy was recommended.  He is taking aspirin and clopidogrel.  Before this occurred we had held his ARB and diuretics due to orthostatic hypotension.  His  systolic blood pressure is now 160.  He was felt to be mildly hypervolemic during his hospitalization.  Creatinine was stable in the 1.8-1.9 range during the hospitalization, his baseline.  He had mild hypoxemia during his hospitalization.  His echocardiogram showed LVEF 50-55%, nondiagnostic diastolic function assessment (in general the study was very technically limited).  He is very sedentary.  He has not had any angina pectoris.  He still has a bottle of sublingual nitroglycerin tablets from 10 years ago.  He denies orthopnea PND, syncope or any new neurological complaints.  He has mild ankle swelling.  Pacemaker interrogation shows normal device function. Estimated generator longevity is less than 3 months ( 2.62 V, ERI 0.60 V), lead parameters are all excellent. There is 45 % atrial pacing and 99 % ventricular pacing, with increased ventricular pacing compared to past downloads.  At times he has had paroxysmal atrial tachycardia but since his last pacemaker interrogation on July 6 he has not had any single episode of mode switch or high ventricular rate.  His nephrologist is Dr. Edrick Oh  He underwent staged LAD and RCA revascularization with drug-eluting stents in 2005 and has not required subsequent percutaneous revascularization procedures. His most recent nuclear study in January 2014 showed an EF of 34% with a fixed inferior defect and no reversible ischemia. I'm not sure about the accuracy of left ventricular ejection fraction assessment. His most recent echocardiogram performed around the same date showed a mildly depressed LVEF of 45-50% (note the presence of right ventricular pacing which may alter the accuracy of EF assessment). 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  The patient does not have symptoms concerning for COVID-19 infection (fever, chills, cough, or new shortness of breath).    Past Medical History:  Diagnosis Date  . Abnormal Doppler ultrasound of carotid artery 12/16/2012   mildly abnormal doppler . no prior studies to compare to. follow up studies are recommended when clinically indicated.  . Acute respiratory failure with hypoxia ([place]) 11/2018  . Blepharitis   . Cancer ([place])   . Carotid stenosis, left 11/2018   on left 60%  . Diabetes mellitus without complication ([place])   . Exogenous obesity   . Gastric ulcer   . GI bleed   . History of stress test 06/06/2012   low risk scan. fixed inferior defect consistant with prior infarction with moderatly reduced EF. no appreciable evidence of ischemia. no significant change from previous study  . Hx of echocardiogram 08-15-2012   EF  45-50 % very difficult study, severe LVH, pacemaker, trace TR,mildly enlarged RV,; aorta mildly calcified  . Pacemaker 2011   [employer] Accent DDDR ; this is the third generator this pt. has had  . Shingles   . TIA (transient ischemic attack) 11/2018   vs stroke not seen on CT  . UTI (lower urinary tract infection)    Past Surgical History:  Procedure Laterality Date  . CARDIAC CATHETERIZATION  12-29-2003   rt. coronary angiography was done with a 6 french 4 cm taper coronary cath. This demonstrated  approximately 70 to 75% mildly segmental stenosis in the mid RCA before the acute margin          . CARDIAC CATHETERIZATION  11-19-2003   diagnostic cath,, pt. to get second cath to have stent placed  . cardiac stents    . CATARACT EXTRACTION    . KNEE SURGERY Right   . PROSTATE BIOPSY    . prostate seed implants    . SPINE SURGERY    . WISDOM TOOTH EXTRACTION       Current Meds  Medication Sig  . aspirin 81 MG tablet Take 81 mg by mouth daily.  . clopidogrel (PLAVIX) 75 MG tablet  Take 75 mg by mouth daily.   . furosemide (LASIX) 40 MG tablet Take 40 mg by mouth daily.  . irbesartan (AVAPRO) 75 MG tablet Take 75 mg by mouth daily.   . metoprolol succinate (TOPROL-XL) 25 MG 24 hr tablet TAKE 1 & 1/2 (ONE & ONE-HALF) TABLETS BY MOUTH DAILY (Patient taking differently: Take 37.5 mg by mouth daily. )  . pantoprazole (PROTONIX) 40 MG tablet Take 40 mg by mouth daily.   . potassium chloride (K-DUR) 10 MEQ tablet Take 20 mEq by mouth daily.  . rosuvastatin (CRESTOR) 40 MG tablet Take 40 mg by mouth daily.   . vitamin C (ASCORBIC ACID) 500 MG tablet Take 500 mg by mouth daily.  . vitamin E 400 UNIT capsule Take 400 Units by mouth daily.   Current Facility-Administered Medications for the 12/27/18 encounter (Telemedicine) with Sanda Klein, MD  Medication  . betamethasone acetate-betamethasone sodium phosphate (CELESTONE) injection 3 mg     Allergies:   Patient has no known allergies.   Social History   Tobacco Use  . Smoking status: Never Smoker  . Smokeless tobacco: Former Network engineer Use Topics  .  Alcohol use: Yes    Comment: daily wine  . Drug use: No     Family Hx: The patient's family history is not on file.  ROS:   Please see the history of present illness.    All other systems reviewed and are negative.   Prior CV studies:   The following studies were reviewed today:  Echocardiogram November 25, 2018 Vascular ultrasound of carotids November 25, 2018 comprehensive pacemaker check today  Labs/Other Tests and Data Reviewed:    EKG:  Intracardiac electrogram via his pacemaker shows atrial paced, ventricular paced rhythm  Recent Labs: 11/24/2018: ALT 22 11/26/2018: BUN 26; Creatinine, Ser 1.83; Hemoglobin 15.4; Platelets 142; Potassium 4.0; Sodium 140   Recent Lipid Panel Lab Results  Component Value Date/Time   CHOL 156 11/24/2018 11:37 PM   TRIG 236 (H) 11/24/2018 11:37 PM   HDL 35 (L) 11/24/2018 11:37 PM   CHOLHDL 4.5 11/24/2018 11:37 PM   LDLCALC  74 11/24/2018 11:37 PM   LDLDIRECT 138 (H) 04/01/2015 09:27 AM    Wt Readings from Last 3 Encounters:  12/27/18 256 lb (116.1 kg)  11/24/18 258 lb (117 kg)  10/16/18 258 lb (117 kg)     Objective:    Vital Signs:  BP (!) 161/80   Pulse 75   Temp 97.6 F (36.4 C)   Wt 256 lb (116.1 kg)   SpO2 91%   BMI 40.70 kg/m    VITAL SIGNS:  reviewed GEN:  no acute distress EYES:  sclerae anicteric, EOMI - Extraocular Movements Intact RESPIRATORY:  normal respiratory effort, symmetric expansion CARDIOVASCULAR:  lower extremity edema noted SKIN:  no rash, lesions or ulcers. MUSCULOSKELETAL:  no obvious deformities. NEURO:  alert and oriented x 3, no obvious focal deficit PSYCH:  normal affect  Obese  ASSESSMENT & PLAN:    1. CHF: Obesity seriously limits the physical exam, even more of a problem with a video visit, but I suspect that he is mildly hypervolemic.  Recent echo was a poor quality study but still shows preserved LV systolic function. 2. SSS: Heart rate histogram is blunted but he is also extremely sedentary.   3. 2nd deg AVB: He now has 100% ventricular pacing despite not receiving medications with negative chronotropic effect.  If EF drops he is a candidate for CRT-P. 4. PPM: Device approaching ERI, anticipated in the next 3 months.  Generator change out procedure discussed in detail including possible complications 5. CAD: Asymptomatic.  S/p DES stenting proximal LAD stenosis and DX2 POBA, June, 2005 and staged RCA DES, August, 2005.  Last functional study was in 2014.   Avoid contrast-based procedures due to advancing kidney disease. 6. DM: On insulin, followed by Dr. Buddy Duty.  improved control, most recent hemoglobin A1c 7.0 restart lower dose ARB for renal protection.  His systolic blood pressure is too high 7. HTN: Restart irbesartan 75 mg daily and resume his diuretic.  Watch for recurrent symptoms of orthostatic hypotension. 8. Morbid obesity: Contributing to his  respiratory problems and metabolic issues. 9. HLP: LDL cholesterol very close to target (less than 70).  Triglycerides mildly elevated and HDL decreased, consistent with insulin resistance. 10. CKD stage 3-4: Restarting irbesartan half previous dose and resuming his diuretic.  The recheck labs in 30 days. 11. OSA: Intolerant of CPAP.  No success with weight loss.   COVID-19 Education: The signs and symptoms of COVID-19 were discussed with the patient and how to seek care for testing (follow up with PCP or  arrange E-visit).  The importance of social distancing was discussed today.  Time:   Today, I have spent 15 minutes with the patient with telehealth technology discussing the above problems.     Medication Adjustments/Labs and Tests Ordered: Current medicines are reviewed at length with the patient today.  Concerns regarding medicines are outlined above.   Tests Ordered: Orders Placed This Encounter  Procedures  . Basic metabolic panel    Medication Changes: Meds ordered this encounter  Medications  . nitroGLYCERIN (NITROSTAT) 0.4 MG SL tablet    Sig: Place 1 tablet (0.4 mg total) under the tongue every 5 (five) minutes as needed for chest pain.    Dispense:  25 tablet    Refill:  1    Follow Up:  Virtual Visit or In Person October  Signed, Sanda Klein, MD  12/27/2018 5:54 PM    [place]

## 2018-12-27 NOTE — Telephone Encounter (Signed)
Call placed to the patient to go over the AVS instructions from the visit today with Dr. Sallyanne Kuster.   He will come back in one month for a BMET. He will restart Furosemide 40 mg once daily, Potassium 10 mEq, two tablets, once daily, and Irbesartan 75 mg once daily.  The patient has verbalized his understanding.

## 2018-12-27 NOTE — Telephone Encounter (Signed)
New Message   Patient calling in under the impression that his appointment that was scheduled for toady (12/27/18) at 10:40 am was a virtual visit. Informed patient that it was an in office visit. Patient states that he was not able to come in today/right now, so rescheduled appointment to 02/24/19 at 9:20 am. Patient states that his battery on his pacemake may need to be changed before then and may not be able to wait until October for an appointment. Please advise.

## 2018-12-27 NOTE — Telephone Encounter (Signed)
The patient has had a virtual appointment today with Dr. Sallyanne Kuster

## 2018-12-27 NOTE — Patient Instructions (Addendum)
Medication Instructions:  Restart the Furosemide 40 mg once daily Restart the Potassium 10 mEq, two tablets (20 mEq), once daily Restart the Irbesartan 75 mg once daily  If you need a refill on your cardiac medications before your next appointment, please call your pharmacy.   Lab work: Your provider would like for you to return in one month to have the following labs drawn: BMET. You do not need an appointment for the lab. Once in our office lobby there is a podium where you can sign in and ring the doorbell to alert [place] that you are here. The lab is open from 8:00 am to 4:30 pm; closed for lunch from 12:45pm-1:45pm.  If you have labs (blood work) drawn today and your tests are completely normal, you will receive your results only by: [place] (if you have MyChart) OR A paper copy in the mail If you have any lab test that is abnormal or we need to change your treatment, we will call you to review the results.  Testing/Procedures: None ordered  Follow-Up: Please keep your appointment with Dr. Sallyanne Kuster on 02/24/2019

## 2018-12-30 ENCOUNTER — Other Ambulatory Visit: Payer: Self-pay

## 2018-12-30 ENCOUNTER — Encounter: Payer: Self-pay | Admitting: Neurology

## 2018-12-30 ENCOUNTER — Ambulatory Visit (INDEPENDENT_AMBULATORY_CARE_PROVIDER_SITE_OTHER): Payer: Medicare Other | Admitting: Neurology

## 2018-12-30 VITALS — BP 160/80 | HR 82 | Ht 67.0 in | Wt 261.0 lb

## 2018-12-30 DIAGNOSIS — R404 Transient alteration of awareness: Secondary | ICD-10-CM | POA: Diagnosis not present

## 2018-12-30 DIAGNOSIS — I2510 Atherosclerotic heart disease of native coronary artery without angina pectoris: Secondary | ICD-10-CM | POA: Diagnosis not present

## 2018-12-30 DIAGNOSIS — G459 Transient cerebral ischemic attack, unspecified: Secondary | ICD-10-CM | POA: Diagnosis not present

## 2018-12-30 NOTE — Progress Notes (Signed)
NEUROLOGY CONSULTATION NOTE  [name] MRN: [id number] DOB: 08-Nov-1931  Referring provider: Dr. [name] Primary care provider: Dr. [name]  Reason for consult:  TIA  Dear Dr Stephanie Acre:  Thank you for your kind referral of [name] for consultation of the above symptoms. Although his history is well known to you, please allow me to reiterate it for the purpose of our medical record. The patient was accompanied to the clinic by his daughter-in-law who also provides collateral information. Records and images were personally reviewed where available.   HISTORY OF PRESENT ILLNESS: This is a pleasant 85 year old right-handed man with a history of diabetes, CKD, hypothyroidism, OSA on CPAP, CAD, cardiomyopathy, sinus node dysfunction s/p pacemaker placement, presenting for evaluation of TIA. Hospital records were reviewed. On 11/24/2018, after coming home from shopping, he felt sort of strange. He turned on the computer and when it came on, he could not understand anything on it, appearing like hieroglyphics. He could not understand the letters on the keyboard. He could talk and alerted his family. He recalls looking at the clock and the hands were appearing and disappearing. Family drove him to the ER, and along the way initially he could not recognize/read the street signs, then a few minutes later he started recognizing them. He denied any headache, focal numbness/tingling/weakness.  He has chronic tinnitus and recalls the tinnitus was louder than usual and he felt very hot. In the ER, he had 2 head CTs which did not show any significant abnormalities. He had CTA head and neck reporting 60% left ICA stenosis,then carotid dopplers which only showed 40-59% left ICA stenosis. Revascularization was not recommended at that time, aggressive risk factor modification and dual antiplatelet therapy recommended, with 6 monthly carotid ultrasound studies. Echo showed normal EF, LDL 74, HbA1c 7.1.  He was taking aspirin and Plavix prior to hospital admission, no changes made. He had orthostatic hypotension and medications were adjusted by Cardiology. He denies any further similar spells since then, but reports episodes where there would be momentary pauses, he would be doing something then "boom" then comes back. He recalls a similar instance in the 1980s and had his pacemaker placed, and looking back at his diary, he has had these spells since January 2020. Usually they occur when he is doing something boring like driving and suddenly "gets this moment." He reports a different incident 2 nights ago when all of a sudden he could not raise his left arm and the hand felt numb, "fingers like hotdogs," lasting 3-4 minutes. Leg and face were unaffected, he could read and talk to his wife. They report his blood sugar was very low in the 70s and he took Glucerna. They did not check his BP. Since hospital discharge, he has noticed difficulties starting a sentence, he would start with the wrong word and catch himself. He has noticed mistakes when writing. I reviewed his symptom diary, in January he had a fainting sensation while at the computer, the screen became very bright and blurry and he felt like he was vanishing. He could hardly find his pulse and called his family. They noted his BP was quite low but not "scary low." He had a second one that month again on the computer where the back of his eyes hurt and for a short time the screen was too bright to see, similarly he could not find his pulse at the wrist. In February, he woke up with fingers feeling like hotdogs. In March,  he again had an episode where he was starting to fade, revived with Glucerna. He states he realized the sensitivity to light from the computer and migraine brain fog was due to hypoglycemia.   [name] denies any staring/unresponsive episodes, he denies any olfactory/gustatory hallucinations, myoclonic jerks. He has vertigo when  sleeping on his right side and feels wobbly when walking. No paresthesias in his feet but they feel numb like he is wearing shoes with thick rubber. He had a normal birth and early development, no history of febrile convulsions, CNS infections, significant head injuries, family history of seizures.    PAST MEDICAL HISTORY: Past Medical History:  Diagnosis Date   Abnormal Doppler ultrasound of carotid artery 12/16/2012   mildly abnormal doppler . no prior studies to compare to. follow up studies are recommended when clinically indicated.   Acute respiratory failure with hypoxia (HCC) 11/2018   Blepharitis    Cancer (HCC)    Carotid stenosis, left 11/2018   on left 60%   Diabetes mellitus without complication (HCC)    Exogenous obesity    Gastric ulcer    GI bleed    History of stress test 06/06/2012   low risk scan. fixed inferior defect consistant with prior infarction with moderatly reduced EF. no appreciable evidence of ischemia. no significant change from previous study   Hx of echocardiogram 08-15-2012   EF  45-50 % very difficult study, severe LVH, pacemaker, trace TR,mildly enlarged RV,; aorta mildly calcified   Pacemaker 2011   [employer] Accent DDDR ; this is the third generator this pt. has had   Shingles    TIA (transient ischemic attack) 11/2018   vs stroke not seen on CT   UTI (lower urinary tract infection)     PAST SURGICAL HISTORY: Past Surgical History:  Procedure Laterality Date   CARDIAC CATHETERIZATION  12-29-2003   rt. coronary angiography was done with a 6 french 4 cm taper coronary cath. This demonstrated  approximately 70 to 75% mildly segmental stenosis in the mid RCA before the acute margin           CARDIAC CATHETERIZATION  11-19-2003   diagnostic cath,, pt. to get second cath to have stent placed   cardiac stents     CATARACT EXTRACTION     KNEE SURGERY Right    PROSTATE BIOPSY     prostate seed implants     SPINE SURGERY     WISDOM  TOOTH EXTRACTION      MEDICATIONS: Current Outpatient Medications on File Prior to Visit  Medication Sig Dispense Refill   acetaminophen (TYLENOL) 500 MG tablet Take 1,000 mg by mouth daily as needed for mild pain.     aspirin 81 MG tablet Take 81 mg by mouth daily.     calcitRIOL (ROCALTROL) 0.25 MCG capsule Take 0.25 mcg by mouth every Monday, Wednesday, and Friday.     Cholecalciferol (VITAMIN D-1000 MAX ST) 25 MCG (1000 UT) tablet Take 2,000 Units by mouth daily.      clopidogrel (PLAVIX) 75 MG tablet Take 75 mg by mouth daily.      Coenzyme Q10 (COQ10) 150 MG CAPS Take 1 tablet by mouth daily.      docusate sodium (COLACE) 100 MG capsule Take 200 mg by mouth daily.     furosemide (LASIX) 40 MG tablet Take 40 mg by mouth daily.     insulin NPH-regular Human (70-30) 100 UNIT/ML injection Inject 65 Units into the skin 2 (two) times daily  with a meal.      irbesartan (AVAPRO) 75 MG tablet Take 75 mg by mouth daily.      levothyroxine (SYNTHROID, LEVOTHROID) 25 MCG tablet Take 25 mcg by mouth daily.      metoprolol succinate (TOPROL-XL) 25 MG 24 hr tablet TAKE 1 & 1/2 (ONE & ONE-HALF) TABLETS BY MOUTH DAILY (Patient taking differently: Take 37.5 mg by mouth daily. ) 135 tablet 2   nitroGLYCERIN (NITROSTAT) 0.4 MG SL tablet Place 1 tablet (0.4 mg total) under the tongue every 5 (five) minutes as needed for chest pain. 25 tablet 1   Omega-3 Fatty Acids (FISH OIL) 1000 MG CAPS Take 2,400 mg by mouth daily.      pantoprazole (PROTONIX) 40 MG tablet Take 40 mg by mouth daily.      potassium chloride (K-DUR) 10 MEQ tablet Take 20 mEq by mouth daily.     rosuvastatin (CRESTOR) 40 MG tablet Take 40 mg by mouth daily.      vitamin C (ASCORBIC ACID) 500 MG tablet Take 500 mg by mouth daily.     vitamin E 400 UNIT capsule Take 400 Units by mouth daily.     Current Facility-Administered Medications on File Prior to Visit  Medication Dose Route Frequency Provider Last Rate Last Dose     betamethasone acetate-betamethasone sodium phosphate (CELESTONE) injection 3 mg  3 mg Intramuscular Once Edrick Kins, DPM        ALLERGIES: No Known Allergies  FAMILY HISTORY: History reviewed. No pertinent family history.  SOCIAL HISTORY: Social History   Socioeconomic History   Marital status: Married    Spouse name: Not on file   Number of children: Not on file   Years of education: Not on file   Highest education level: Not on file  Occupational History   Not on file  Social Needs   Financial resource strain: Not on file   Food insecurity    Worry: Not on file    Inability: Not on file   Transportation needs    Medical: Not on file    Non-medical: Not on file  Tobacco Use   Smoking status: Never Smoker   Smokeless tobacco: Former Systems developer  Substance and Sexual Activity   Alcohol use: Yes    Comment: daily wine   Drug use: No   Sexual activity: Not Currently    Partners: Female  Lifestyle   Physical activity    Days per week: Not on file    Minutes per session: Not on file   Stress: Not on file  Relationships   Social connections    Talks on phone: Not on file    Gets together: Not on file    Attends religious service: Not on file    Active member of club or organization: Not on file    Attends meetings of clubs or organizations: Not on file    Relationship status: Not on file   Intimate partner violence    Fear of current or ex partner: Not on file    Emotionally abused: Not on file    Physically abused: Not on file    Forced sexual activity: Not on file  Other Topics Concern   Not on file  Social History Narrative   Right handed      Lives with wife, son and daughter in law      Highest level of edu- doctorate    REVIEW OF SYSTEMS: Constitutional: No fevers, chills, or sweats, no generalized  fatigue, change in appetite Eyes: No visual changes, double vision, eye pain Ear, nose and throat: No hearing loss, ear pain, nasal  congestion, sore throat Cardiovascular: No chest pain, palpitations Respiratory:  No shortness of breath at rest or with exertion, wheezes GastrointestinaI: No nausea, vomiting, diarrhea, abdominal pain, fecal incontinence Genitourinary:  No dysuria, urinary retention or frequency Musculoskeletal:  + neck pain, back pain Integumentary: No rash, pruritus, skin lesions Neurological: as above Psychiatric: No depression, insomnia, anxiety Endocrine: No palpitations, fatigue, diaphoresis, mood swings, change in appetite, change in weight, increased thirst Hematologic/Lymphatic:  No anemia, purpura, petechiae. Allergic/Immunologic: no itchy/runny eyes, nasal congestion, recent allergic reactions, rashes  PHYSICAL EXAM: Vitals:   12/30/18 1310  BP: (!) 160/80  Pulse: 82  SpO2: 90%   General: No acute distress Head:  Normocephalic/atraumatic Skin/Extremities: No rash, no edema Neurological Exam: Mental status: alert and oriented to person, place, and time, no dysarthria or aphasia, Fund of knowledge is appropriate.  Recent and remote memory are intact.  Attention and concentration are normal.    Able to name objects and repeat phrases. Cranial nerves: CN I: not tested CN II: pupils equal, round and reactive to light, visual fields intact CN III, IV, VI:  full range of motion, no nystagmus, no ptosis CN V: facial sensation intact CN VII: upper and lower face symmetric CN VIII: hearing intact to conversation CN IX, X: gag intact, uvula midline CN XI: sternocleidomastoid and trapezius muscles intact CN XII: tongue midline Bulk & Tone: normal, no fasciculations. Motor: 5/5 throughout with no pronator drift. Sensation: intact to light touch, cold, pin on both UE and LE, decreased vibration to knees bilaterally. Romberg test positive sway Deep Tendon Reflexes: +1 on both UE, unable to elicit on both LE, no ankle clonus Plantar responses: downgoing bilaterally Cerebellar: no incoordination  on finger to nose testing  Gait: slightly wide-based, no ataxia, carries cane while ambulating Tremor: none  IMPRESSION: This is a pleasant 85 year old right-handed man with a history of diabetes, CKD, hypothyroidism, OSA on CPAP, CAD, cardiomyopathy, sinus node dysfunction s/p pacemaker placement, presenting for evaluation of TIA. He had an transient episode suggestive of alexia (inability to read), he did not try to write, unclear if there was agraphia as well, but he could speak without difficulties, no focal symptoms or headache. He was back to baseline in the hospital. Unable to do MRI brain due to pacemaker, head CT no changes. Symptoms felt to be due to left hemispheric TIA due to left carotid disease, initial CTA reported 60% left ICA stenosis, however only 40-59% on carotid doppler, hence revascularization was not recommended. He is on dual antiplatelet and continues to work on vascular risk factors. He however reports episodes of brief gaps in time, as well as a transient episode of left arm weakness which would be in a different vascular distribution from left ICA. A 1-hour EEG will be ordered to assess for possible seizure as cause of transient symptoms, if normal, a 24-hour EEG will be done. Carotid dopplers will be done every 6 months with his cardiologist. Continue aggressive management of vascula risk factors, he knows to go to the ER for any sudden change in symptoms. Follow-up in 3-4 months, he knows to call for any changes.    Thank you for allowing me to participate in the care of this patient. Please do not hesitate to call for any questions or concerns.   Ellouise Newer, M.D.  CC: Dr. Stephanie Acre, Dr. Sallyanne Kuster

## 2018-12-30 NOTE — Patient Instructions (Addendum)
1. Schedule 1-hour EEG. If normal, we will do a 24-hour EEG. Our office will call you to schedule this appointment in a few days.  2. Continue all your medications  3. Schedule carotid ultrasound every 6 months  4. Follow-up in 3-4 months, call for any changes

## 2018-12-31 ENCOUNTER — Encounter: Payer: Self-pay | Admitting: Cardiology

## 2018-12-31 NOTE — Progress Notes (Signed)
Remote pacemaker transmission.   

## 2018-12-31 NOTE — Addendum Note (Signed)
Addended by: Tiajuana Amass on: 12/31/2018 11:37 AM   Modules accepted: Level of Service

## 2019-01-03 ENCOUNTER — Other Ambulatory Visit: Payer: Self-pay

## 2019-01-03 DIAGNOSIS — I10 Essential (primary) hypertension: Secondary | ICD-10-CM | POA: Diagnosis not present

## 2019-01-03 DIAGNOSIS — N183 Chronic kidney disease, stage 3 unspecified: Secondary | ICD-10-CM

## 2019-01-04 LAB — BASIC METABOLIC PANEL
BUN/Creatinine Ratio: 23 (ref 10–24)
BUN: 44 mg/dL — ABNORMAL HIGH (ref 8–27)
CO2: 24 mmol/L (ref 20–29)
Calcium: 9 mg/dL (ref 8.6–10.2)
Chloride: 100 mmol/L (ref 96–106)
Creatinine, Ser: 1.93 mg/dL — ABNORMAL HIGH (ref 0.76–1.27)
GFR calc Af Amer: 36 mL/min/{1.73_m2} — ABNORMAL LOW (ref >59)
GFR calc non Af Amer: 31 mL/min/{1.73_m2} — ABNORMAL LOW (ref >59)
Glucose: 143 mg/dL — ABNORMAL HIGH (ref 65–99)
Potassium: 5 mmol/L (ref 3.5–5.2)
Sodium: 139 mmol/L (ref 134–144)

## 2019-01-08 ENCOUNTER — Other Ambulatory Visit: Payer: Self-pay

## 2019-01-08 ENCOUNTER — Ambulatory Visit (INDEPENDENT_AMBULATORY_CARE_PROVIDER_SITE_OTHER): Payer: Medicare Other | Admitting: Neurology

## 2019-01-08 DIAGNOSIS — G459 Transient cerebral ischemic attack, unspecified: Secondary | ICD-10-CM | POA: Diagnosis not present

## 2019-01-08 DIAGNOSIS — R404 Transient alteration of awareness: Secondary | ICD-10-CM

## 2019-01-08 NOTE — Progress Notes (Signed)
Thank you for the update [facility]

## 2019-01-13 NOTE — Procedures (Signed)
ELECTROENCEPHALOGRAM REPORT  Date of Study: 01/08/2019  Patient's Name: [name] MRN: [id number] Date of Birth: Sep 07, 1931  Referring Provider: Dr. Ellouise Newer  Clinical History: This is an 85 year old man with a transient episode of alexia, also reporting episodes of brief gaps in time. He recently had a transient episode of left arm weakness. EEG for classification.   Medications: TYLENOL 500 MG tablet aspirin 81 MG tablet ROCALTROL 0.25 MCG capsule VITAMIN D-1000 MAX ST) 25 MCG PLAVIX 75 MG tablet COQ10 150 MG CAPS COLACE 100 MG capsule LASIX 40 MG tablet insulin NPH-regular Human (70-30) 100 UNIT/ML injection AVAPRO 75 MG tablet SYNTHROID, LEVOTHROID 25 MCG tablet TOPROL-XL 25 MG 24 hr tablet NITROSTAT 0.4 MG SL tablet FISH OIL 1000 MG CAPS PROTONIX 40 MG tablet CRESTOR) 40 MG tablet ASCORBIC ACID) 500 MG tablet vitamin E 400 UNIT capsule  Technical Summary: A multichannel digital 1-hour EEG recording measured by the international 10-20 system with electrodes applied with paste and impedances below 5000 ohms performed in our laboratory with EKG monitoring in an awake and asleep patient.  Hyperventilation was not performed. Photic stimulation was performed.  The digital EEG was referentially recorded, reformatted, and digitally filtered in a variety of bipolar and referential montages for optimal display.    Description: The patient is awake and asleep during the recording.  During maximal wakefulness, there is a symmetric, medium voltage 9 Hz posterior dominant rhythm that attenuates with eye opening.  The record is symmetric.  During drowsiness and sleep, there is an increase in theta slowing of the background with vertex waves seen. Photic stimulation did not elicit any abnormalities.  There were no epileptiform discharges or electrographic seizures seen.    EKG lead was unremarkable.  Impression: This 1-hour awake and asleep EEG is normal.    Clinical  Correlation: A normal EEG does not exclude a clinical diagnosis of epilepsy.  If further clinical questions remain, prolonged EEG may be helpful.  Clinical correlation is advised.   Ellouise Newer, M.D.

## 2019-01-14 ENCOUNTER — Other Ambulatory Visit: Payer: Self-pay

## 2019-01-14 ENCOUNTER — Telehealth: Payer: Self-pay

## 2019-01-14 DIAGNOSIS — G459 Transient cerebral ischemic attack, unspecified: Secondary | ICD-10-CM

## 2019-01-14 DIAGNOSIS — R404 Transient alteration of awareness: Secondary | ICD-10-CM

## 2019-01-14 NOTE — Telephone Encounter (Signed)
Pts wife informed of normal EEG.  New order placed for 24 hour ambulatory EEG.  Staff message sent to Laureen Ochs to schedule.

## 2019-01-14 NOTE — Telephone Encounter (Signed)
-----   Message from Cameron Sprang, MD sent at 01/13/2019  1:13 PM EDT ----- Pls let him know the brain wave test was normal, we had discussed doing a 24-hour EEG, proceed as discussed (pls order if we did not order on last visit). Thanks!

## 2019-01-22 ENCOUNTER — Other Ambulatory Visit: Payer: Self-pay

## 2019-01-22 ENCOUNTER — Ambulatory Visit (INDEPENDENT_AMBULATORY_CARE_PROVIDER_SITE_OTHER): Payer: Medicare Other | Admitting: Podiatry

## 2019-01-22 ENCOUNTER — Encounter: Payer: Self-pay | Admitting: Podiatry

## 2019-01-22 DIAGNOSIS — N183 Type 2 diabetes mellitus with diabetic chronic kidney disease: Secondary | ICD-10-CM

## 2019-01-22 DIAGNOSIS — Z794 Long term (current) use of insulin: Secondary | ICD-10-CM

## 2019-01-22 DIAGNOSIS — M79674 Pain in right toe(s): Secondary | ICD-10-CM

## 2019-01-22 DIAGNOSIS — E1122 Type 2 diabetes mellitus with diabetic chronic kidney disease: Secondary | ICD-10-CM

## 2019-01-22 DIAGNOSIS — B351 Tinea unguium: Secondary | ICD-10-CM | POA: Diagnosis not present

## 2019-01-22 DIAGNOSIS — M79675 Pain in left toe(s): Secondary | ICD-10-CM | POA: Diagnosis not present

## 2019-01-22 NOTE — Progress Notes (Signed)
Complaint:  Visit Type: Patient returns to my office for continued preventative foot care services. Complaint: Patient states" my nails have grown long and thick and become painful to walk and wear shoes" Patient has been diagnosed with DM with no foot complications. The patient presents for preventative foot care services. No changes to ROS.  Patient is taking plavix.  Podiatric Exam: Vascular: dorsalis pedis and posterior tibial pulses are palpable bilateral. Capillary return is immediate. Temperature gradient is WNL. Skin turgor WNL  Sensorium: Normal Semmes Weinstein monofilament test. Normal tactile sensation bilaterally. Nail Exam: Pt has thick disfigured discolored nails with subungual debris noted bilateral entire nail hallux through fifth toenails Ulcer Exam: There is no evidence of ulcer or pre-ulcerative changes or infection. Orthopedic Exam: Muscle tone and strength are WNL. No limitations in general ROM. No crepitus or effusions noted. Foot type and digits show no abnormalities. Bony prominences are unremarkable. Skin: No Porokeratosis. No infection or ulcers  Diagnosis:  Onychomycosis, , Pain in right toe, pain in left toes  Treatment & Plan Procedures and Treatment: Consent by patient was obtained for treatment procedures. The patient understood the discussion of treatment and procedures well. All questions were answered thoroughly reviewed. Debridement of mycotic and hypertrophic toenails, 1 through 5 bilateral and clearing of subungual debris. No ulceration, no infection noted.   Return Visit-Office Procedure: Patient instructed to return to the office for a follow up visit 3 months for continued evaluation and treatment.    [name] DPM

## 2019-01-28 ENCOUNTER — Ambulatory Visit (INDEPENDENT_AMBULATORY_CARE_PROVIDER_SITE_OTHER): Payer: Medicare Other | Admitting: *Deleted

## 2019-01-28 DIAGNOSIS — Z950 Presence of cardiac pacemaker: Secondary | ICD-10-CM

## 2019-01-29 LAB — CUP PACEART REMOTE DEVICE CHECK
Battery Remaining Longevity: 0 mo
Battery Voltage: 2.6 V
Brady Statistic RA Percent Paced: 49 %
Brady Statistic RV Percent Paced: 99 %
Date Time Interrogation Session: 20200909213134
Implantable Lead Implant Date: 19901207
Implantable Lead Implant Date: 20111110
Implantable Lead Location: 753862
Implantable Lead Location: 753862
Implantable Pulse Generator Implant Date: 20111110
Lead Channel Impedance Value: 300 Ohm
Lead Channel Impedance Value: 540 Ohm
Lead Channel Pacing Threshold Amplitude: 0.625 V
Lead Channel Pacing Threshold Amplitude: 1.125 V
Lead Channel Pacing Threshold Pulse Width: 0.4 ms
Lead Channel Pacing Threshold Pulse Width: 0.6 ms
Lead Channel Sensing Intrinsic Amplitude: 12 mV
Lead Channel Sensing Intrinsic Amplitude: 4.8 mV
Lead Channel Setting Pacing Amplitude: 1.375
Lead Channel Setting Pacing Amplitude: 1.875
Lead Channel Setting Pacing Pulse Width: 0.6 ms
Lead Channel Setting Sensing Sensitivity: 3 mV
Pulse Gen Model: 2210
Pulse Gen Serial Number: 7174903

## 2019-02-04 ENCOUNTER — Other Ambulatory Visit: Payer: Self-pay

## 2019-02-04 ENCOUNTER — Ambulatory Visit (INDEPENDENT_AMBULATORY_CARE_PROVIDER_SITE_OTHER): Payer: Medicare Other | Admitting: Neurology

## 2019-02-04 DIAGNOSIS — G459 Transient cerebral ischemic attack, unspecified: Secondary | ICD-10-CM | POA: Diagnosis not present

## 2019-02-04 DIAGNOSIS — R404 Transient alteration of awareness: Secondary | ICD-10-CM

## 2019-02-12 ENCOUNTER — Encounter: Payer: Self-pay | Admitting: Cardiology

## 2019-02-12 NOTE — Addendum Note (Signed)
Addended by: Tiajuana Amass on: 02/12/2019 12:15 PM   Modules accepted: Level of Service

## 2019-02-12 NOTE — Progress Notes (Signed)
Remote pacemaker transmission.   

## 2019-02-17 ENCOUNTER — Telehealth: Payer: Self-pay

## 2019-02-17 NOTE — Telephone Encounter (Signed)
-----   Message from Cameron Sprang, MD sent at 02/17/2019  9:00 AM EDT ----- Pls let him know the brain wave test was normal. How has he been feeling, has he had any more episodes? Thanks

## 2019-02-17 NOTE — Telephone Encounter (Signed)
Left message informing pt of normal brain wave test. Instructed pt to call the office back if he continues to have episodes or to give [place] an update on how he is feeling.

## 2019-02-17 NOTE — Procedures (Addendum)
ELECTROENCEPHALOGRAM REPORT  Dates of Recording: 02/04/2019 2:35PM to 02/05/2019 2:41PM  Patient's Name: [name] MRN: [id number] Date of Birth: Jan 07, 1932  Referring Provider: Dr. Ellouise Newer  Procedure: 24-hour ambulatory EEG  History: This is an 85 year old man with a transient episode of alexia, also reporting episodes of brief gaps in time and transient left arm weakness. EEG for classification.  Medications:  TYLENOL 500 MG tablet aspirin 81 MG tablet ROCALTROL 0.25 MCG capsule VITAMIN D-1000 MAX ST 25 MCG PLAVIX 75 MG tablet COQ10 150 MG CAPS COLACE 100 MG capsule LASIX 40 MG tablet insulin NPH-regular Human (70-30) 100 UNIT/ML injection AVAPRO 75 MG tablet SYNTHROID, LEVOTHROID 25 MCG tablet TOPROL-XL 25 MG 24 hr tablet NITROSTAT 0.4 MG SL tablet FISH OIL 1000 MG CAPS PROTONIX 40 MG tablet CRESTOR 40 MG tablet ASCORBIC ACID 500 MG tablet vitamin E 400 UNIT capsule  Technical Summary: This is a 24-hour multichannel digital video EEG recording measured by the international 10-20 system with electrodes applied with paste and impedances below 5000 ohms performed as portable with EKG monitoring.  The digital EEG was referentially recorded, reformatted, and digitally filtered in a variety of bipolar and referential montages for optimal display.    DESCRIPTION OF RECORDING: During maximal wakefulness, the background activity consisted of a symmetric 9 Hz posterior dominant rhythm which was reactive to eye opening.  There were no epileptiform discharges or focal slowing seen in wakefulness.  During the recording, the patient progresses through wakefulness, drowsiness, and Stage 2 sleep.  Again, there were no epileptiform discharges seen.  Events: There were no push button events.   There were no electrographic seizures seen.  EKG lead showed occasional irregular rhythm.  IMPRESSION: This 24-hour ambulatory video EEG study is normal.    CLINICAL CORRELATION: A normal  EEG does not exclude a clinical diagnosis of epilepsy. Typical events were not captured. If further clinical questions remain, inpatient video EEG monitoring may be helpful.   Ellouise Newer, M.D.

## 2019-02-20 DIAGNOSIS — N390 Urinary tract infection, site not specified: Secondary | ICD-10-CM | POA: Diagnosis not present

## 2019-02-24 ENCOUNTER — Encounter: Payer: Medicare Other | Admitting: Cardiovascular Disease

## 2019-02-25 ENCOUNTER — Ambulatory Visit (INDEPENDENT_AMBULATORY_CARE_PROVIDER_SITE_OTHER): Payer: Medicare Other | Admitting: *Deleted

## 2019-02-25 DIAGNOSIS — I5042 Chronic combined systolic (congestive) and diastolic (congestive) heart failure: Secondary | ICD-10-CM

## 2019-02-25 DIAGNOSIS — I441 Atrioventricular block, second degree: Secondary | ICD-10-CM

## 2019-02-26 LAB — CUP PACEART REMOTE DEVICE CHECK
Battery Remaining Longevity: 1 mo
Battery Remaining Percentage: 0.5 %
Battery Voltage: 2.59 V
Brady Statistic AP VP Percent: 48 %
Brady Statistic AP VS Percent: 1 %
Brady Statistic AS VP Percent: 51 %
Brady Statistic AS VS Percent: 1 %
Brady Statistic RA Percent Paced: 48 %
Brady Statistic RV Percent Paced: 99 %
Date Time Interrogation Session: 20201006060006
Implantable Lead Implant Date: 19901207
Implantable Lead Implant Date: 20111110
Implantable Lead Location: 753862
Implantable Lead Location: 753862
Implantable Pulse Generator Implant Date: 20111110
Lead Channel Impedance Value: 300 Ohm
Lead Channel Impedance Value: 540 Ohm
Lead Channel Pacing Threshold Amplitude: 0.875 V
Lead Channel Pacing Threshold Amplitude: 1 V
Lead Channel Pacing Threshold Pulse Width: 0.4 ms
Lead Channel Pacing Threshold Pulse Width: 0.6 ms
Lead Channel Sensing Intrinsic Amplitude: 12 mV
Lead Channel Sensing Intrinsic Amplitude: 5 mV
Lead Channel Setting Pacing Amplitude: 1.25 V
Lead Channel Setting Pacing Amplitude: 1.875
Lead Channel Setting Pacing Pulse Width: 0.6 ms
Lead Channel Setting Sensing Sensitivity: 3 mV
Pulse Gen Model: 2210
Pulse Gen Serial Number: 7174903

## 2019-02-28 DIAGNOSIS — Z23 Encounter for immunization: Secondary | ICD-10-CM | POA: Diagnosis not present

## 2019-03-06 NOTE — Addendum Note (Signed)
Addended by: Douglass Rivers D on: 03/06/2019 09:00 AM   Modules accepted: Level of Service

## 2019-03-06 NOTE — Progress Notes (Signed)
Remote pacemaker transmission.   

## 2019-03-10 ENCOUNTER — Telehealth: Payer: Self-pay | Admitting: Neurology

## 2019-03-10 NOTE — Telephone Encounter (Signed)
Patient is wanting the results of EEG. Thanks!

## 2019-03-10 NOTE — Telephone Encounter (Signed)
Pt was informed of normal results on 02/17/19 via voicemail. Verbally informed pt today. No concerns.

## 2019-03-17 ENCOUNTER — Encounter: Payer: Self-pay | Admitting: Neurology

## 2019-03-19 ENCOUNTER — Other Ambulatory Visit: Payer: Self-pay | Admitting: Cardiovascular Disease

## 2019-03-19 DIAGNOSIS — I495 Sick sinus syndrome: Secondary | ICD-10-CM | POA: Diagnosis not present

## 2019-03-19 DIAGNOSIS — N189 Chronic kidney disease, unspecified: Secondary | ICD-10-CM | POA: Diagnosis not present

## 2019-03-19 DIAGNOSIS — I129 Hypertensive chronic kidney disease with stage 1 through stage 4 chronic kidney disease, or unspecified chronic kidney disease: Secondary | ICD-10-CM | POA: Diagnosis not present

## 2019-03-19 DIAGNOSIS — N183 Chronic kidney disease, stage 3 unspecified: Secondary | ICD-10-CM | POA: Diagnosis not present

## 2019-03-19 DIAGNOSIS — N2581 Secondary hyperparathyroidism of renal origin: Secondary | ICD-10-CM | POA: Diagnosis not present

## 2019-03-19 DIAGNOSIS — N184 Chronic kidney disease, stage 4 (severe): Secondary | ICD-10-CM | POA: Diagnosis not present

## 2019-03-19 DIAGNOSIS — E785 Hyperlipidemia, unspecified: Secondary | ICD-10-CM | POA: Diagnosis not present

## 2019-03-19 DIAGNOSIS — I2510 Atherosclerotic heart disease of native coronary artery without angina pectoris: Secondary | ICD-10-CM | POA: Diagnosis not present

## 2019-03-19 DIAGNOSIS — Z6841 Body Mass Index (BMI) 40.0 and over, adult: Secondary | ICD-10-CM | POA: Diagnosis not present

## 2019-03-19 DIAGNOSIS — E1122 Type 2 diabetes mellitus with diabetic chronic kidney disease: Secondary | ICD-10-CM | POA: Diagnosis not present

## 2019-03-19 DIAGNOSIS — D631 Anemia in chronic kidney disease: Secondary | ICD-10-CM | POA: Diagnosis not present

## 2019-03-28 ENCOUNTER — Encounter: Payer: Medicare Other | Admitting: *Deleted

## 2019-03-31 ENCOUNTER — Telehealth (INDEPENDENT_AMBULATORY_CARE_PROVIDER_SITE_OTHER): Payer: Medicare Other | Admitting: Cardiovascular Disease

## 2019-03-31 ENCOUNTER — Other Ambulatory Visit: Payer: Self-pay

## 2019-03-31 VITALS — BP 135/72 | HR 68 | Ht 67.0 in | Wt 256.0 lb

## 2019-03-31 DIAGNOSIS — E782 Mixed hyperlipidemia: Secondary | ICD-10-CM

## 2019-03-31 DIAGNOSIS — G4733 Obstructive sleep apnea (adult) (pediatric): Secondary | ICD-10-CM

## 2019-03-31 DIAGNOSIS — I10 Essential (primary) hypertension: Secondary | ICD-10-CM

## 2019-03-31 DIAGNOSIS — Z794 Long term (current) use of insulin: Secondary | ICD-10-CM | POA: Diagnosis not present

## 2019-03-31 DIAGNOSIS — E1122 Type 2 diabetes mellitus with diabetic chronic kidney disease: Secondary | ICD-10-CM

## 2019-03-31 DIAGNOSIS — N1832 Chronic kidney disease, stage 3b: Secondary | ICD-10-CM | POA: Diagnosis not present

## 2019-03-31 DIAGNOSIS — Z950 Presence of cardiac pacemaker: Secondary | ICD-10-CM | POA: Diagnosis not present

## 2019-03-31 DIAGNOSIS — I441 Atrioventricular block, second degree: Secondary | ICD-10-CM

## 2019-03-31 DIAGNOSIS — I5042 Chronic combined systolic (congestive) and diastolic (congestive) heart failure: Secondary | ICD-10-CM | POA: Diagnosis not present

## 2019-03-31 DIAGNOSIS — I495 Sick sinus syndrome: Secondary | ICD-10-CM | POA: Diagnosis not present

## 2019-03-31 DIAGNOSIS — E1121 Type 2 diabetes mellitus with diabetic nephropathy: Secondary | ICD-10-CM | POA: Diagnosis not present

## 2019-03-31 DIAGNOSIS — I2510 Atherosclerotic heart disease of native coronary artery without angina pectoris: Secondary | ICD-10-CM

## 2019-03-31 NOTE — H&P (View-Only) (Signed)
Virtual Visit via Video Note   This visit type was conducted due to national recommendations for restrictions regarding the COVID-19 Pandemic (e.g. social distancing) in an effort to limit this patient's exposure and mitigate transmission in our community.  Due to his co-morbid illnesses, this patient is at least at moderate risk for complications without adequate follow up.  This format is felt to be most appropriate for this patient at this time.  All issues noted in this document were discussed and addressed.  A limited physical exam was performed with this format.  Please refer to the patient's chart for his consent to telehealth for [facility].   Date:  03/31/2019   ID:  [name], DOB [birth year], MRN [id number]  Patient Location: Home Provider Location: Office  PCP:  [name], MD  Cardiologist:  Sanda Klein, MD  Electrophysiologist:  None   Evaluation Performed:  Follow-Up Visit  Chief Complaint:  Recent TIA, pacemaker approaching ERI  History of Present Illness:    [name] is a 85 y.o. 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 The device was initially implanted in 1990, with generator changes in 2002 in 2011. 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  No new TIA events have occurred. On dual antiplatelet therapy. No AFib has been seen on the pacemaker.   Pacemaker remote download shows brief atrial tachycardia that appears to coincide with an episode when he woke up with a racing heart beat.   Symptoms of orthostatic hypotension are less. Has balance issues due to neuropathy.  Has some ankle edema, but only at the end of the day. Denies angina and dyspnea with household activities. Develops dyspnea if he walks > 100 feet   His echocardiogram showed LVEF 50-55%, nondiagnostic diastolic function assessment (in general the study was very technically limited).  Pacemaker interrogation shows normal device function. Estimated generator longevity is less than 3 months ( 2.62 V, ERI 2.60 V), lead parameters are all excellent. There is 48 % atrial pacing and 99 % ventricular pacing, similr to the last previous download.  His nephrologist was Dr. Edrick Oh, now Dr. Birdie Riddle.  He underwent staged LAD and RCA revascularization with drug-eluting stents in 2005 and has not required subsequent percutaneous revascularization procedures. His most recent nuclear study in January 2014 showed an EF of 34% with a fixed inferior defect and no reversible ischemia. I'm not sure about the accuracy of left ventricular ejection fraction assessment. His most recent echocardiogram performed around the same date showed a mildly depressed LVEF of 45-50% (note the presence of right ventricular pacing which may alter the accuracy of EF assessment). 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  The patient does not have symptoms concerning for COVID-19 infection (fever, chills, cough, or new shortness of breath).    Past Medical History:  Diagnosis Date  . Abnormal Doppler ultrasound of carotid artery 12/16/2012   mildly abnormal doppler . no prior studies to compare to. follow up studies are recommended when clinically indicated.  . Acute respiratory failure with hypoxia ([place]) 11/2018  .  Blepharitis   . Cancer ([place])   . Carotid stenosis, left 11/2018   on left 60%  . Diabetes mellitus without complication ([place])   . Exogenous obesity   . Gastric ulcer   .  GI bleed   . History of stress test 06/06/2012   low risk scan. fixed inferior defect consistant with prior infarction with moderatly reduced EF. no appreciable evidence of ischemia. no significant change from previous study  . Hx of echocardiogram 08-15-2012   EF  45-50 % very difficult study, severe LVH, pacemaker, trace TR,mildly enlarged RV,; aorta mildly calcified  . Pacemaker 2011   [employer] Jude Accent DDDR ; this is the third generator this pt. has had  . Shingles   . TIA (transient ischemic attack) 11/2018   vs stroke not seen on CT  . UTI (lower urinary tract infection)    Past Surgical History:  Procedure Laterality Date  . CARDIAC CATHETERIZATION  12-29-2003   rt. coronary angiography was done with a 6 french 4 cm taper coronary cath. This demonstrated  approximately 70 to 75% mildly segmental stenosis in the mid RCA before the acute margin          . CARDIAC CATHETERIZATION  11-19-2003   diagnostic cath,, pt. to get second cath to have stent placed  . cardiac stents    . CATARACT EXTRACTION    . KNEE SURGERY Right   . PROSTATE BIOPSY    . prostate seed implants    . SPINE SURGERY    . WISDOM TOOTH EXTRACTION       Current Meds  Medication Sig  . acetaminophen (TYLENOL) 500 MG tablet Take 1,000 mg by mouth daily as needed for mild pain.  Marland Kitchen aspirin 81 MG tablet Take 81 mg by mouth daily.  . calcitRIOL (ROCALTROL) 0.25 MCG capsule Take 0.25 mcg by mouth every Monday, Wednesday, and Friday.  . Cholecalciferol (VITAMIN D-1000 MAX ST) 25 MCG (1000 UT) tablet Take 2,000 Units by mouth daily.   . clopidogrel (PLAVIX) 75 MG tablet Take 75 mg by mouth daily.   . Coenzyme Q10 (COQ10) 150 MG CAPS Take 1 tablet by mouth daily.   Marland Kitchen docusate sodium (COLACE) 100 MG capsule Take 200 mg by mouth daily.  . furosemide (LASIX) 40 MG tablet Take 40 mg by mouth daily.  . insulin NPH-regular Human (70-30) 100 UNIT/ML injection Inject 65 Units into the skin 2 (two) times daily with a meal.   .  irbesartan (AVAPRO) 75 MG tablet Take 75 mg by mouth daily.   Marland Kitchen levothyroxine (SYNTHROID, LEVOTHROID) 25 MCG tablet Take 25 mcg by mouth daily.   . metoprolol succinate (TOPROL-XL) 25 MG 24 hr tablet TAKE 1 & 1/2 (ONE & ONE-HALF) TABLETS BY MOUTH DAILY (Patient taking differently: Take 37.5 mg by mouth daily. )  . nitroGLYCERIN (NITROSTAT) 0.4 MG SL tablet Place 1 tablet (0.4 mg total) under the tongue every 5 (five) minutes as needed for chest pain.  . Omega-3 Fatty Acids (FISH OIL) 1000 MG CAPS Take 2,400 mg by mouth daily.   . pantoprazole (PROTONIX) 40 MG tablet Take 40 mg by mouth daily.   . potassium chloride (KLOR-CON) 10 MEQ tablet Take 2 tablets by mouth once daily  . rosuvastatin (CRESTOR) 40 MG tablet Take 40 mg by mouth daily.   . vitamin C (ASCORBIC ACID) 500 MG tablet Take 500 mg by mouth daily.  . vitamin E 400 UNIT capsule Take 400 Units by mouth daily.   Current  Facility-Administered Medications for the 03/31/19 encounter (Telemedicine) with Sanda Klein, MD  Medication  . betamethasone acetate-betamethasone sodium phosphate (CELESTONE) injection 3 mg     Allergies:   Patient has no known allergies.   Social History   Tobacco Use  . Smoking status: Never Smoker  . Smokeless tobacco: Former Network engineer Use Topics  . Alcohol use: Yes    Comment: daily wine  . Drug use: No     Family Hx: The patient's family history is not on file.  ROS:   Please see the history of present illness.    All other systems are reviewed and are negative.  Prior CV studies:   The following studies were reviewed today:  Pacemaker download 02/25/2019  Labs/Other Tests and Data Reviewed:    EKG:  Intracardiac electrogram via his pacemaker shows atrial paced, ventricular paced rhythm  Recent Labs: 11/24/2018: ALT 22 11/26/2018: Hemoglobin 15.4; Platelets 142 01/03/2019: BUN 44; Creatinine, Ser 1.93; Potassium 5.0; Sodium 139   Recent Lipid Panel Lab Results  Component Value  Date/Time   CHOL 156 11/24/2018 11:37 PM   TRIG 236 (H) 11/24/2018 11:37 PM   HDL 35 (L) 11/24/2018 11:37 PM   CHOLHDL 4.5 11/24/2018 11:37 PM   LDLCALC 74 11/24/2018 11:37 PM   LDLDIRECT 138 (H) 04/01/2015 09:27 AM    Wt Readings from Last 3 Encounters:  03/31/19 256 lb (116.1 kg)  12/30/18 261 lb (118.4 kg)  12/27/18 256 lb (116.1 kg)     Objective:    Vital Signs:  BP 135/72   Pulse 68   Ht 5\' 7"  (1.702 m)   Wt 256 lb (116.1 kg)   SpO2 95%   BMI 40.10 kg/m    VITAL SIGNS:  reviewed GEN:  no acute distress EYES:  sclerae anicteric, EOMI - Extraocular Movements Intact RESPIRATORY:  normal respiratory effort, symmetric expansion CARDIOVASCULAR:  lower extremity edema noted SKIN:  no rash, lesions or ulcers. MUSCULOSKELETAL:  no obvious deformities. NEURO:  alert and oriented x 3, no obvious focal deficit PSYCH:  normal affect  Obese  ASSESSMENT & PLAN:    1. CHF: preserved EF, seems to have minimal edema. NYHA class II. 2. SSS: VOnly roghly 50% A paced. Heart rate histogram is blunted but he is also extremely sedentary.   3. 2nd deg AVB: He now has 100% ventricular pacing despite not receiving medications with negative chronotropic effect.  If EF drops he is a candidate for CRT-P. 4. PPM: Device approaching ERI, anticipated in the next 3 months.  Generator change out procedure discussed in detail including possible complications. Monitor battery voltage monthly. 5. CAD: Asymptomatic.  S/p DES stenting proximal LAD stenosis and DX2 POBA, June, 2005 and staged RCA DES, August, 2005.  Last functional study was in 2014.   Avoid contrast-based procedures due to advancing kidney disease. 6. DM: On insulin, followed by Dr. Buddy Duty.  improved control, most recent hemoglobin A1c 7.0 restart lower dose ARB for renal protection.  His systolic blood pressure is too high 7. HTN:  Need to maintain the balance between orthostatic hypotension (contributed to TIA?) and renal dysfunction  versus CHF. I think SBP<150 may be acceptable. 8. Morbid obesity: Contributing to his respiratory problems and metabolic issues. 9. HLP: labs consistent w metabolic sd. Target LDL<70. 10. CKD stage 3-4: Restarting irbesartan half previous dose and resuming his diuretic.  He had labs w Dr. Birdie Riddle ( I received copies of CBC and Fe studies, but not BMET). 11. OSA: Intolerant  of CPAP.  No success with weight loss.   COVID-19 Education: The signs and symptoms of COVID-19 were discussed with the patient and how to seek care for testing (follow up with PCP or arrange E-visit).  The importance of social distancing was discussed today.  Time:   Today, I have spent 15 minutes with the patient with telehealth technology discussing the above problems.     Medication Adjustments/Labs and Tests Ordered: Current medicines are reviewed at length with the patient today.  Concerns regarding medicines are outlined above.   Tests Ordered: No orders of the defined types were placed in this encounter.   Medication Changes: No orders of the defined types were placed in this encounter.   Patient Instructions  Medication Instructions:  No changes *If you need a refill on your cardiac medications before your next appointment, please call your pharmacy*  Lab Work: None rodered If you have labs (blood work) drawn today and your tests are completely normal, you will receive your results only by: Marland Kitchen MyChart Message (if you have MyChart) OR . A paper copy in the mail If you have any lab test that is abnormal or we need to change your treatment, we will call you to review the results.  Testing/Procedures: None ordered  Follow-Up: At [facility], you and your health needs are our priority.  As part of our continuing mission to provide you with exceptional heart care, we have created designated Provider Care Teams.  These Care Teams include your primary Cardiologist (physician) and Advanced Practice  Providers (APPs -  Physician Assistants and Nurse Practitioners) who all work together to provide you with the care you need, when you need it.  Your next appointment:   6 months  The format for your next appointment:   In Person  Provider:   Sanda Klein, MD      Signed, Sanda Klein, MD  03/31/2019 8:42 PM    [place]

## 2019-03-31 NOTE — Patient Instructions (Signed)
Medication Instructions:  No changes *If you need a refill on your cardiac medications before your next appointment, please call your pharmacy*  Lab Work: None rodered If you have labs (blood work) drawn today and your tests are completely normal, you will receive your results only by: Marland Kitchen MyChart Message (if you have MyChart) OR . A paper copy in the mail If you have any lab test that is abnormal or we need to change your treatment, we will call you to review the results.  Testing/Procedures: None ordered  Follow-Up: At [facility], you and your health needs are our priority.  As part of our continuing mission to provide you with exceptional heart care, we have created designated Provider Care Teams.  These Care Teams include your primary Cardiologist (physician) and Advanced Practice Providers (APPs -  Physician Assistants and Nurse Practitioners) who all work together to provide you with the care you need, when you need it.  Your next appointment:   6 months  The format for your next appointment:   In Person  Provider:   Sanda Klein, MD

## 2019-03-31 NOTE — Progress Notes (Signed)
Virtual Visit via Video Note   This visit type was conducted due to national recommendations for restrictions regarding the COVID-19 Pandemic (e.g. social distancing) in an effort to limit this patient's exposure and mitigate transmission in our community.  Due to his co-morbid illnesses, this patient is at least at moderate risk for complications without adequate follow up.  This format is felt to be most appropriate for this patient at this time.  All issues noted in this document were discussed and addressed.  A limited physical exam was performed with this format.  Please refer to the patient's chart for his consent to telehealth for [facility]   Date:  03/31/2019   ID:  [name], DOB 03-May-1932, MRN [id number]  Patient Location: Home Provider Location: Office  PCP:  [name], MD  Cardiologist:  Sanda Klein, MD  Electrophysiologist:  None   Evaluation Performed:  Follow-Up Visit  Chief Complaint:  Recent TIA, pacemaker approaching ERI  History of Present Illness:    [name] is a 85 y.o. 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 The device was initially implanted in 1990, with generator changes in 2002 in 2011. 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  No new TIA events have occurred. On dual antiplatelet therapy. No AFib has been seen on the pacemaker.   Pacemaker remote download shows brief atrial tachycardia that appears to coincide with an episode when he woke up with a racing heart beat.   Symptoms of orthostatic hypotension are less. Has balance issues due to neuropathy.  Has some ankle edema, but only at the end of the day. Denies angina and dyspnea with household activities. Develops dyspnea if he walks > 100 feet   His echocardiogram showed LVEF 50-55%, nondiagnostic diastolic function assessment (in general the study was very technically limited).  Pacemaker interrogation shows normal device function. Estimated generator longevity is less than 3 months ( 2.62 V, ERI 2.60 V), lead parameters are all excellent. There is 48 % atrial pacing and 99 % ventricular pacing, similr to the last previous download.  His nephrologist was Dr. Edrick Oh, now Dr. Birdie Riddle.  He underwent staged LAD and RCA revascularization with drug-eluting stents in 2005 and has not required subsequent percutaneous revascularization procedures. His most recent nuclear study in January 2014 showed an EF of 34% with a fixed inferior defect and no reversible ischemia. I'm not sure about the accuracy of left ventricular ejection fraction assessment. His most recent echocardiogram performed around the same date showed a mildly depressed LVEF of 45-50% (note the presence of right ventricular pacing which may alter the accuracy of EF assessment). 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  The patient does not have symptoms concerning for COVID-19 infection (fever, chills, cough, or new shortness of breath).    Past Medical History:  Diagnosis Date  . Abnormal Doppler ultrasound of carotid artery 12/16/2012   mildly abnormal doppler . no prior studies to compare to. follow up studies are recommended when clinically indicated.  . Acute respiratory failure with hypoxia ([place]) 11/2018  .  Blepharitis   . Cancer ([place])   . Carotid stenosis, left 11/2018   on left 60%  . Diabetes mellitus without complication ([place])   . Exogenous obesity   . Gastric ulcer   .  GI bleed   . History of stress test 06/06/2012   low risk scan. fixed inferior defect consistant with prior infarction with moderatly reduced EF. no appreciable evidence of ischemia. no significant change from previous study  . Hx of echocardiogram 08-15-2012   EF  45-50 % very difficult study, severe LVH, pacemaker, trace TR,mildly enlarged RV,; aorta mildly calcified  . Pacemaker 2011   [employer] Accent DDDR ; this is the third generator this pt. has had  . Shingles   . TIA (transient ischemic attack) 11/2018   vs stroke not seen on CT  . UTI (lower urinary tract infection)    Past Surgical History:  Procedure Laterality Date  . CARDIAC CATHETERIZATION  12-29-2003   rt. coronary angiography was done with a 6 french 4 cm taper coronary cath. This demonstrated  approximately 70 to 75% mildly segmental stenosis in the mid RCA before the acute margin          . CARDIAC CATHETERIZATION  11-19-2003   diagnostic cath,, pt. to get second cath to have stent placed  . cardiac stents    . CATARACT EXTRACTION    . KNEE SURGERY Right   . PROSTATE BIOPSY    . prostate seed implants    . SPINE SURGERY    . WISDOM TOOTH EXTRACTION       Current Meds  Medication Sig  . acetaminophen (TYLENOL) 500 MG tablet Take 1,000 mg by mouth daily as needed for mild pain.  Marland Kitchen aspirin 81 MG tablet Take 81 mg by mouth daily.  . calcitRIOL (ROCALTROL) 0.25 MCG capsule Take 0.25 mcg by mouth every Monday, Wednesday, and Friday.  . Cholecalciferol (VITAMIN D-1000 MAX ST) 25 MCG (1000 UT) tablet Take 2,000 Units by mouth daily.   . clopidogrel (PLAVIX) 75 MG tablet Take 75 mg by mouth daily.   . Coenzyme Q10 (COQ10) 150 MG CAPS Take 1 tablet by mouth daily.   Marland Kitchen docusate sodium (COLACE) 100 MG capsule Take 200 mg by mouth daily.  . furosemide (LASIX) 40 MG tablet Take 40 mg by mouth daily.  . insulin NPH-regular Human (70-30) 100 UNIT/ML injection Inject 65 Units into the skin 2 (two) times daily with a meal.   .  irbesartan (AVAPRO) 75 MG tablet Take 75 mg by mouth daily.   Marland Kitchen levothyroxine (SYNTHROID, LEVOTHROID) 25 MCG tablet Take 25 mcg by mouth daily.   . metoprolol succinate (TOPROL-XL) 25 MG 24 hr tablet TAKE 1 & 1/2 (ONE & ONE-HALF) TABLETS BY MOUTH DAILY (Patient taking differently: Take 37.5 mg by mouth daily. )  . nitroGLYCERIN (NITROSTAT) 0.4 MG SL tablet Place 1 tablet (0.4 mg total) under the tongue every 5 (five) minutes as needed for chest pain.  . Omega-3 Fatty Acids (FISH OIL) 1000 MG CAPS Take 2,400 mg by mouth daily.   . pantoprazole (PROTONIX) 40 MG tablet Take 40 mg by mouth daily.   . potassium chloride (KLOR-CON) 10 MEQ tablet Take 2 tablets by mouth once daily  . rosuvastatin (CRESTOR) 40 MG tablet Take 40 mg by mouth daily.   . vitamin C (ASCORBIC ACID) 500 MG tablet Take 500 mg by mouth daily.  . vitamin E 400 UNIT capsule Take 400 Units by mouth daily.   Current  Facility-Administered Medications for the 03/31/19 encounter (Telemedicine) with Sanda Klein, MD  Medication  . betamethasone acetate-betamethasone sodium phosphate (CELESTONE) injection 3 mg     Allergies:   Patient has no known allergies.   Social History   Tobacco Use  . Smoking status: Never Smoker  . Smokeless tobacco: Former Network engineer Use Topics  . Alcohol use: Yes    Comment: daily wine  . Drug use: No     Family Hx: The patient's family history is not on file.  ROS:   Please see the history of present illness.    All other systems are reviewed and are negative.  Prior CV studies:   The following studies were reviewed today:  Pacemaker download 02/25/2019  Labs/Other Tests and Data Reviewed:    EKG:  Intracardiac electrogram via his pacemaker shows atrial paced, ventricular paced rhythm  Recent Labs: 11/24/2018: ALT 22 11/26/2018: Hemoglobin 15.4; Platelets 142 01/03/2019: BUN 44; Creatinine, Ser 1.93; Potassium 5.0; Sodium 139   Recent Lipid Panel Lab Results  Component Value  Date/Time   CHOL 156 11/24/2018 11:37 PM   TRIG 236 (H) 11/24/2018 11:37 PM   HDL 35 (L) 11/24/2018 11:37 PM   CHOLHDL 4.5 11/24/2018 11:37 PM   LDLCALC 74 11/24/2018 11:37 PM   LDLDIRECT 138 (H) 04/01/2015 09:27 AM    Wt Readings from Last 3 Encounters:  03/31/19 256 lb (116.1 kg)  12/30/18 261 lb (118.4 kg)  12/27/18 256 lb (116.1 kg)     Objective:    Vital Signs:  BP 135/72   Pulse 68   Ht 5\' 7"  (1.702 m)   Wt 256 lb (116.1 kg)   SpO2 95%   BMI 40.10 kg/m    VITAL SIGNS:  reviewed GEN:  no acute distress EYES:  sclerae anicteric, EOMI - Extraocular Movements Intact RESPIRATORY:  normal respiratory effort, symmetric expansion CARDIOVASCULAR:  lower extremity edema noted SKIN:  no rash, lesions or ulcers. MUSCULOSKELETAL:  no obvious deformities. NEURO:  alert and oriented x 3, no obvious focal deficit PSYCH:  normal affect  Obese  ASSESSMENT & PLAN:    1. CHF: preserved EF, seems to have minimal edema. NYHA class II. 2. SSS: VOnly roghly 50% A paced. Heart rate histogram is blunted but he is also extremely sedentary.   3. 2nd deg AVB: He now has 100% ventricular pacing despite not receiving medications with negative chronotropic effect.  If EF drops he is a candidate for CRT-P. 4. PPM: Device approaching ERI, anticipated in the next 3 months.  Generator change out procedure discussed in detail including possible complications. Monitor battery voltage monthly. 5. CAD: Asymptomatic.  S/p DES stenting proximal LAD stenosis and DX2 POBA, June, 2005 and staged RCA DES, August, 2005.  Last functional study was in 2014.   Avoid contrast-based procedures due to advancing kidney disease. 6. DM: On insulin, followed by Dr. Buddy Duty.  improved control, most recent hemoglobin A1c 7.0 restart lower dose ARB for renal protection.  His systolic blood pressure is too high 7. HTN:  Need to maintain the balance between orthostatic hypotension (contributed to TIA?) and renal dysfunction  versus CHF. I think SBP<150 may be acceptable. 8. Morbid obesity: Contributing to his respiratory problems and metabolic issues. 9. HLP: labs consistent w metabolic sd. Target LDL<70. 10. CKD stage 3-4: Restarting irbesartan half previous dose and resuming his diuretic.  He had labs w Dr. Birdie Riddle ( I received copies of CBC and Fe studies, but not BMET). 11. OSA: Intolerant  of CPAP.  No success with weight loss.   COVID-19 Education: The signs and symptoms of COVID-19 were discussed with the patient and how to seek care for testing (follow up with PCP or arrange E-visit).  The importance of social distancing was discussed today.  Time:   Today, I have spent 15 minutes with the patient with telehealth technology discussing the above problems.     Medication Adjustments/Labs and Tests Ordered: Current medicines are reviewed at length with the patient today.  Concerns regarding medicines are outlined above.   Tests Ordered: No orders of the defined types were placed in this encounter.   Medication Changes: No orders of the defined types were placed in this encounter.   Patient Instructions  Medication Instructions:  No changes *If you need a refill on your cardiac medications before your next appointment, please call your pharmacy*  Lab Work: None rodered If you have labs (blood work) drawn today and your tests are completely normal, you will receive your results only by: Marland Kitchen MyChart Message (if you have MyChart) OR . A paper copy in the mail If you have any lab test that is abnormal or we need to change your treatment, we will call you to review the results.  Testing/Procedures: None ordered  Follow-Up: At [facility], you and your health needs are our priority.  As part of our continuing mission to provide you with exceptional heart care, we have created designated Provider Care Teams.  These Care Teams include your primary Cardiologist (physician) and Advanced Practice  Providers (APPs -  Physician Assistants and Nurse Practitioners) who all work together to provide you with the care you need, when you need it.  Your next appointment:   6 months  The format for your next appointment:   In Person  Provider:   Sanda Klein, MD      Signed, Sanda Klein, MD  03/31/2019 8:42 PM    [place]

## 2019-04-02 ENCOUNTER — Encounter: Payer: Self-pay | Admitting: *Deleted

## 2019-04-02 ENCOUNTER — Telehealth: Payer: Self-pay | Admitting: *Deleted

## 2019-04-02 ENCOUNTER — Telehealth: Payer: Self-pay | Admitting: Emergency Medicine

## 2019-04-02 DIAGNOSIS — E1122 Type 2 diabetes mellitus with diabetic chronic kidney disease: Secondary | ICD-10-CM | POA: Diagnosis not present

## 2019-04-02 DIAGNOSIS — I441 Atrioventricular block, second degree: Secondary | ICD-10-CM

## 2019-04-02 DIAGNOSIS — Z01818 Encounter for other preprocedural examination: Secondary | ICD-10-CM

## 2019-04-02 NOTE — Telephone Encounter (Signed)
Notified patient that device has reached RRT today. Will expect call to set up generator change from Dr Croituro's office.Patient VP 99% on transmission report today.

## 2019-04-02 NOTE — Telephone Encounter (Signed)
Call placed to the patient to schedule his pacemaker gen change with 04/07/2019 with Dr. Sallyanne Kuster.    [facility] [address], [place]  [place], [place] [address]  Phone: [phone] Fax: [phone]    Generator Change Procedure Instructions  You are scheduled for a Generator Change (battery change) on  04/07/2019  with Dr. Sallyanne Kuster.  1. Please arrive at the [facility], Entrance "A"  at [facility] at  1:30 pm on the day of your procedure. (The address is [address])  2. DIET: You may have a light breakfast before 8 am and then nothing to eat or drink after that.   3. LABS: Your provider would like for you to return on Thursday 04/03/2019 to have the following labs drawn: BMET and CBC. You do not need an appointment for the lab. Once in our office lobby there is a podium where you can sign in and ring the doorbell to alert [place] that you are here. The lab is open from 8:00 am to 4:30 pm; closed for lunch from 12:45pm-1:45pm.  You will need to have the coronavirus test completed prior to your procedure. An appointment has been made at 2:35 pm  on 04/03/2019. This is a Drive Up Visit at the [facility] [address]. Please tell them that you are there for pre procedure testing. Someone will direct you to the appropriate testing line. Stay in your car and someone will be with you shortly. Please make sure to have all other labs completed before this test because you will need to stay quarantined until your procedure.   4. MEDICATIONS: Nothing to hold  5.  Plan for an overnight stay.  Bring your insurance cards and a list of you medications.  6.  Wash your chest and neck with surgical scrub the evening before and the morning of your procedure.  Rinse well. Please review the surgical scrub instruction sheet given to you.   7. Your chest will need to be shaved prior to this procedure (if needed). We ask that you  do this yourself at home 1 to 2 days before or if uncomfortable/unable to do yourself, then it will be performed by the hospital staff the day of.  * Special note:  Every effort is made to have your procedure done on time.  Occasionally there are emergencies that present themselves at the hospital that may cause delays.  Please be patient if a delay does occur.                                                                                                           * If you have any questions after you get home, please call Lattie Haw, RN at [phone].    [facility] - Preparing For Surgery  Before surgery, you can play an important role. Because skin is not sterile, your skin needs to be as free of germs as possible. You can reduce the number of germs on your skin by  washing with CHG (chlorahexidine gluconate) Soap before surgery.  CHG is an antiseptic cleaner which kills germs and bonds with the skin to continue killing germs even after washing.   Please do not use if you have an allergy to CHG or antibacterial soaps.  If your skin becomes reddened/irritated stop using the CHG.   Do not shave (including legs and underarms) for at least 48 hours prior to first CHG shower.  It is OK to shave your face.  Please follow these instructions carefully:  1.  Shower the night before surgery and the morning of surgery with CHG.  2.  If you choose to wash your hair, wash your hair first as usual with your normal shampoo.  3.  After you shampoo, rinse your hair and body thoroughly to remove the shampoo.  4.  Use CHG as you would any other liquid soap.  You can apply CHG directly to the skin and wash gently with a clean washcloth. 5.  Apply the CHG Soap to your body ONLY FROM THE NECK DOWN.  Do not use on open wounds or open sores.  Avoid contact with your eyes, ears, mouth and genitals (private parts).  Wash genitals (private parts) with your normal soap.  6.  Wash thoroughly, paying special attention to  the area where your surgery will be performed.  7.  Thoroughly rise your body with warm water from the neck down.   8.  DO NOT shower/wash with your normal soap after using and rinsing off the CHG soap.  9.  Pat yourself dry with a clean towel.           10.  Wear clean pajamas.           11.  Place clean sheets on your bed the night of your first shower and do not sleep with pets.  Day of Surgery: Do not apply any deodorants/lotions.  Please wear clean clothes to the hospital/surgery center.

## 2019-04-02 NOTE — Telephone Encounter (Signed)
Will try to schedule for Monday 16th Nov.

## 2019-04-03 ENCOUNTER — Other Ambulatory Visit (HOSPITAL_COMMUNITY)
Admission: RE | Admit: 2019-04-03 | Discharge: 2019-04-03 | Disposition: A | Discharge status: Home / Self Care | Payer: Medicare Other | Source: Ambulatory Visit | Attending: Cardiovascular Disease | Admitting: Cardiovascular Disease

## 2019-04-03 DIAGNOSIS — I441 Atrioventricular block, second degree: Secondary | ICD-10-CM | POA: Diagnosis not present

## 2019-04-03 DIAGNOSIS — Z01812 Encounter for preprocedural laboratory examination: Secondary | ICD-10-CM | POA: Diagnosis not present

## 2019-04-03 DIAGNOSIS — Z01818 Encounter for other preprocedural examination: Secondary | ICD-10-CM | POA: Diagnosis not present

## 2019-04-03 DIAGNOSIS — Z20828 Contact with and (suspected) exposure to other viral communicable diseases: Secondary | ICD-10-CM | POA: Diagnosis not present

## 2019-04-04 LAB — COMPREHENSIVE METABOLIC PANEL
ALT: 21 IU/L (ref 0–44)
AST: 25 IU/L (ref 0–40)
Albumin/Globulin Ratio: 1.6 (ref 1.2–2.2)
Albumin: 4.3 g/dL (ref 3.6–4.6)
Alkaline Phosphatase: 69 IU/L (ref 39–117)
BUN/Creatinine Ratio: 20 (ref 10–24)
BUN: 39 mg/dL — ABNORMAL HIGH (ref 8–27)
Bilirubin Total: 0.5 mg/dL (ref 0.0–1.2)
CO2: 24 mmol/L (ref 20–29)
Calcium: 9.1 mg/dL (ref 8.6–10.2)
Chloride: 99 mmol/L (ref 96–106)
Creatinine, Ser: 1.99 mg/dL — ABNORMAL HIGH (ref 0.76–1.27)
GFR calc Af Amer: 34 mL/min/{1.73_m2} — ABNORMAL LOW (ref >59)
GFR calc non Af Amer: 30 mL/min/{1.73_m2} — ABNORMAL LOW (ref >59)
Globulin, Total: 2.7 g/dL (ref 1.5–4.5)
Glucose: 161 mg/dL — ABNORMAL HIGH (ref 65–99)
Potassium: 5.2 mmol/L (ref 3.5–5.2)
Sodium: 141 mmol/L (ref 134–144)
Total Protein: 7 g/dL (ref 6.0–8.5)

## 2019-04-04 LAB — CBC
Hematocrit: 49.3 % (ref 37.5–51.0)
Hemoglobin: 16.4 g/dL (ref 13.0–17.7)
MCH: 30 pg (ref 26.6–33.0)
MCHC: 33.3 g/dL (ref 31.5–35.7)
MCV: 90 fL (ref 79–97)
Platelets: 177 10*3/uL (ref 150–450)
RBC: 5.46 x10E6/uL (ref 4.14–5.80)
RDW: 13.8 % (ref 11.6–15.4)
WBC: 8.7 10*3/uL (ref 3.4–10.8)

## 2019-04-04 LAB — NOVEL CORONAVIRUS, NAA (HOSP ORDER, SEND-OUT TO REF LAB; TAT 18-24 HRS): SARS-CoV-2, NAA: NOT DETECTED

## 2019-04-05 ENCOUNTER — Other Ambulatory Visit: Payer: Self-pay | Admitting: *Deleted

## 2019-04-05 DIAGNOSIS — I495 Sick sinus syndrome: Secondary | ICD-10-CM

## 2019-04-07 ENCOUNTER — Encounter (HOSPITAL_COMMUNITY)
Admission: RE | Disposition: A | Discharge status: Home / Self Care | Payer: Self-pay | Source: Home / Self Care | Attending: Cardiovascular Disease

## 2019-04-07 ENCOUNTER — Ambulatory Visit (HOSPITAL_COMMUNITY)
Admission: RE | Admit: 2019-04-07 | Discharge: 2019-04-07 | Disposition: A | Discharge status: Home / Self Care | Payer: Medicare Other | Attending: Cardiovascular Disease | Admitting: Cardiovascular Disease

## 2019-04-07 ENCOUNTER — Other Ambulatory Visit: Payer: Self-pay

## 2019-04-07 DIAGNOSIS — Z7982 Long term (current) use of aspirin: Secondary | ICD-10-CM | POA: Diagnosis not present

## 2019-04-07 DIAGNOSIS — Z45010 Encounter for checking and testing of cardiac pacemaker pulse generator [battery]: Secondary | ICD-10-CM | POA: Diagnosis not present

## 2019-04-07 DIAGNOSIS — I509 Heart failure, unspecified: Secondary | ICD-10-CM | POA: Insufficient documentation

## 2019-04-07 DIAGNOSIS — I2510 Atherosclerotic heart disease of native coronary artery without angina pectoris: Secondary | ICD-10-CM | POA: Diagnosis not present

## 2019-04-07 DIAGNOSIS — Z79899 Other long term (current) drug therapy: Secondary | ICD-10-CM | POA: Insufficient documentation

## 2019-04-07 DIAGNOSIS — Z6841 Body Mass Index (BMI) 40.0 and over, adult: Secondary | ICD-10-CM | POA: Insufficient documentation

## 2019-04-07 DIAGNOSIS — I130 Hypertensive heart and chronic kidney disease with heart failure and stage 1 through stage 4 chronic kidney disease, or unspecified chronic kidney disease: Secondary | ICD-10-CM | POA: Insufficient documentation

## 2019-04-07 DIAGNOSIS — Z955 Presence of coronary angioplasty implant and graft: Secondary | ICD-10-CM | POA: Diagnosis not present

## 2019-04-07 DIAGNOSIS — I495 Sick sinus syndrome: Secondary | ICD-10-CM | POA: Diagnosis present

## 2019-04-07 DIAGNOSIS — G4733 Obstructive sleep apnea (adult) (pediatric): Secondary | ICD-10-CM | POA: Diagnosis not present

## 2019-04-07 DIAGNOSIS — N184 Chronic kidney disease, stage 4 (severe): Secondary | ICD-10-CM | POA: Insufficient documentation

## 2019-04-07 DIAGNOSIS — Z79890 Hormone replacement therapy (postmenopausal): Secondary | ICD-10-CM | POA: Insufficient documentation

## 2019-04-07 DIAGNOSIS — E1122 Type 2 diabetes mellitus with diabetic chronic kidney disease: Secondary | ICD-10-CM | POA: Diagnosis not present

## 2019-04-07 DIAGNOSIS — E1140 Type 2 diabetes mellitus with diabetic neuropathy, unspecified: Secondary | ICD-10-CM | POA: Insufficient documentation

## 2019-04-07 DIAGNOSIS — Z8673 Personal history of transient ischemic attack (TIA), and cerebral infarction without residual deficits: Secondary | ICD-10-CM | POA: Diagnosis not present

## 2019-04-07 DIAGNOSIS — I441 Atrioventricular block, second degree: Secondary | ICD-10-CM | POA: Diagnosis not present

## 2019-04-07 DIAGNOSIS — Z794 Long term (current) use of insulin: Secondary | ICD-10-CM | POA: Insufficient documentation

## 2019-04-07 DIAGNOSIS — E785 Hyperlipidemia, unspecified: Secondary | ICD-10-CM | POA: Insufficient documentation

## 2019-04-07 DIAGNOSIS — Z7902 Long term (current) use of antithrombotics/antiplatelets: Secondary | ICD-10-CM | POA: Diagnosis not present

## 2019-04-07 HISTORY — PX: PPM GENERATOR CHANGEOUT: EP1233

## 2019-04-07 LAB — SURGICAL PCR SCREEN
MRSA, PCR: NEGATIVE
Staphylococcus aureus: NEGATIVE

## 2019-04-07 LAB — GLUCOSE, CAPILLARY
Glucose-Capillary: 123 mg/dL — ABNORMAL HIGH (ref 70–99)
Glucose-Capillary: 77 mg/dL (ref 70–99)

## 2019-04-07 SURGERY — PPM GENERATOR CHANGEOUT

## 2019-04-07 MED ORDER — LIDOCAINE HCL (PF) 1 % IJ SOLN
INTRAMUSCULAR | Status: AC
  Filled 2019-04-07: qty 30

## 2019-04-07 MED ORDER — LIDOCAINE HCL (PF) 1 % IJ SOLN
INTRAMUSCULAR | Status: DC | PRN
  Administered 2019-04-07: 35 mL

## 2019-04-07 MED ORDER — SODIUM CHLORIDE 0.9% FLUSH: 3.0000 mL | INTRAVENOUS | Status: DC | PRN

## 2019-04-07 MED ORDER — ONDANSETRON HCL 4 MG/2ML IJ SOLN: 4.0000 mg | Freq: Four times a day (QID) | INTRAMUSCULAR | Status: DC | PRN

## 2019-04-07 MED ORDER — SODIUM CHLORIDE 0.9 % IV SOLN
INTRAVENOUS | Status: DC
  Administered 2019-04-07: 15:00:00 via INTRAVENOUS

## 2019-04-07 MED ORDER — FENTANYL CITRATE (PF) 100 MCG/2ML IJ SOLN
INTRAMUSCULAR | Status: AC
  Filled 2019-04-07: qty 2

## 2019-04-07 MED ORDER — SODIUM CHLORIDE 0.9% FLUSH: 3.0000 mL | Freq: Two times a day (BID) | INTRAVENOUS | Status: DC

## 2019-04-07 MED ORDER — SODIUM CHLORIDE 0.9 % IV SOLN
INTRAVENOUS | Status: AC
  Filled 2019-04-07: qty 2

## 2019-04-07 MED ORDER — FENTANYL CITRATE (PF) 100 MCG/2ML IJ SOLN
INTRAMUSCULAR | Status: DC | PRN
  Administered 2019-04-07: 25 ug via INTRAVENOUS

## 2019-04-07 MED ORDER — ACETAMINOPHEN 325 MG PO TABS: 325.0000 mg | ORAL_TABLET | ORAL | Status: DC | PRN

## 2019-04-07 MED ORDER — MUPIROCIN 2 % EX OINT
TOPICAL_OINTMENT | CUTANEOUS | Status: AC
  Filled 2019-04-07: qty 22

## 2019-04-07 MED ORDER — CEFAZOLIN SODIUM-DEXTROSE 2-4 GM/100ML-% IV SOLN
2.0000 g | INTRAVENOUS | Status: AC
  Administered 2019-04-07: 2 g via INTRAVENOUS

## 2019-04-07 MED ORDER — SODIUM CHLORIDE 0.9 % IV SOLN
80.0000 mg | INTRAVENOUS | Status: AC
  Administered 2019-04-07: 80 mg

## 2019-04-07 MED ORDER — SODIUM CHLORIDE 0.9 % IV SOLN: 250.0000 mL | INTRAVENOUS | Status: DC | PRN

## 2019-04-07 MED ORDER — CEFAZOLIN SODIUM-DEXTROSE 2-4 GM/100ML-% IV SOLN
INTRAVENOUS | Status: AC
  Filled 2019-04-07: qty 100

## 2019-04-07 MED ORDER — CHLORHEXIDINE GLUCONATE 4 % EX LIQD
4.0000 "application " | Freq: Once | CUTANEOUS | Status: DC
  Filled 2019-04-07: qty 60

## 2019-04-07 SURGICAL SUPPLY — 6 items
CABLE SURGICAL S-101-97-12 (CABLE) ×2 IMPLANT
PACEMAKER ASSURITY DR-RF (Pacemaker) ×1 IMPLANT
PAD PRO RADIOLUCENT 2001M-C (PAD) ×2 IMPLANT
POUCH AIGIS-R ANTIBACT ICD (Mesh General) ×2 IMPLANT
POUCH AIGIS-R ANTIBACT ICD LRG (Mesh General) IMPLANT
TRAY PACEMAKER INSERTION (PACKS) ×2 IMPLANT

## 2019-04-07 NOTE — Discharge Instructions (Signed)
Moderate Conscious Sedation, Adult, Care After These instructions provide you with information about caring for yourself after your procedure. Your health care provider may also give you more specific instructions. Your treatment has been planned according to current medical practices, but problems sometimes occur. Call your health care provider if you have any problems or questions after your procedure. What can I expect after the procedure? After your procedure, it is common:  To feel sleepy for several hours.  To feel clumsy and have poor balance for several hours.  To have poor judgment for several hours.  To vomit if you eat too soon. Follow these instructions at home: For at least 24 hours after the procedure:   Do not: ? Participate in activities where you could fall or become injured. ? Drive. ? Use heavy machinery. ? Drink alcohol. ? Take sleeping pills or medicines that cause drowsiness. ? Make important decisions or sign legal documents. ? Take care of children on your own.  Rest. Eating and drinking  Follow the diet recommended by your health care provider.  If you vomit: ? Drink water, juice, or soup when you can drink without vomiting. ? Make sure you have little or no nausea before eating solid foods. General instructions  Have a responsible adult stay with you until you are awake and alert.  Take over-the-counter and prescription medicines only as told by your health care provider.  If you smoke, do not smoke without supervision.  Keep all follow-up visits as told by your health care provider. This is important. Contact a health care provider if:  You keep feeling nauseous or you keep vomiting.  You feel light-headed.  You develop a rash.  You have a fever. Get help right away if:  You have trouble breathing. This information is not intended to replace advice given to you by your health care provider. Make sure you discuss any questions you have  with your health care provider. Document Released: 02/26/2013 Document Revised: 04/20/2017 Document Reviewed: 08/28/2015 Elsevier Patient Education  2020 [place] Discharge Instructions for  Pacemaker/Defibrillator Patients  Activity No restrictions. DO wear your seatbelt, even if it crosses over the pacemaker site.  WOUND CARE - Keep the wound area clean and dry.  Remove the dressing the day after you return home (usually 48 hours after the procedure). - DO NOT SUBMERGE UNDER WATER UNTIL FULLY HEALED (no tub baths, hot tubs, swimming pools, etc.).  - You  may shower or take a sponge bath after the dressing is removed. DO NOT SOAK the area and do not allow the shower to directly spray on the site. - If you have staples, these will be removed in the office in 7-14 days. - If you have tape/steri-strips on your wound, these will fall off; do not pull them off prematurely.   - No bandage is needed on the site.  DO  NOT apply any creams, oils, or ointments to the wound area. - If you notice any drainage or discharge from the wound, any swelling, excessive redness or bruising at the site, or if you develop a fever > 101? F after you are discharged home, call the office at once.  Special Instructions - You are still able to use cellular telephones.  Avoid carrying your cellular phone near your device. - When traveling through airports, show security personnel your identification card to avoid being screened in the metal detectors.  - Avoid arc welding equipment, MRI testing (magnetic resonance imaging),  TENS units (transcutaneous nerve stimulators).  Call the office for questions about other devices. - Avoid electrical appliances that are in poor condition or are not properly grounded. - Microwave ovens are safe to be near or to operate  Remove outer dressing tomorrow- tegaderm and gauze. Leave steri-strips on; clean site with betadine swabs tomorrow

## 2019-04-07 NOTE — Interval H&P Note (Signed)
History and Physical Interval Note:  04/07/2019 2:37 PM   His device reached ERI the day after his office visit.  [name]  has presented today for surgery, with the diagnosis of ERI.  The various methods of treatment have been discussed with the patient and family. After consideration of risks, benefits and other options for treatment, the patient has consented to  Procedure(s): PPM GENERATOR CHANGEOUT (N/A) as a surgical intervention.  The patient's history has been reviewed, patient examined, no change in status, stable for surgery.  I have reviewed the patient's chart and labs.  Questions were answered to the patient's satisfaction.     [name]

## 2019-04-07 NOTE — Op Note (Signed)
Procedure report  Procedure performed:  1. Dual chamber pacemaker generator changeout  2. Light sedation  3. TyRx pouch Reason for procedure:  1. Device generator at elective replacement interval  Procedure performed by:  Sanda Klein, MD  Complications:  None  Estimated blood loss:  <5 mL  Medications administered during procedure:  Ancef 2 g intravenously, lidocaine 1% 30 mL locally, fentanyl 25 mcg intravenously During this procedure the patient is administered a total Fentanyl 25 mcg IV to achieve and maintain moderate conscious sedation.  The patient's heart rate, blood pressure, and oxygen saturation are monitored continuously during the procedure. The period of conscious sedation is 40 minutes, of which I was present face-to-face 100% of this time.  Device details:   Web designer. Jude Assurity MRI, model [id number], serial number [id number] Right atrial lead (chronic) St. Jude, model number 3888K, serial number [id number] (implanted 04/27/1989) Right ventricular lead (chronic) St. Jude   model number 1216T, serial number [id number] (implanted 04/27/1989)  Explanted generator St. Jude Accent,  model number [id number], serial number  [id number] (implanted 03/31/2010)  Procedure details:  After the risks and benefits of the procedure were discussed the patient provided informed consent. She was brought to the cardiac catheter lab in the fasting state. The patient was prepped and draped in usual sterile fashion. Local anesthesia with 1% lidocaine was administered to to the left infraclavicular area. A 5-6cm horizontal incision was made parallel with and 2-3 cm caudal to the right clavicle, in the area of an old scar. Two older scars were seen closer to the left clavicle. Using minimal electrocautery and mostly sharp and blunt dissection the prepectoral pocket was opened carefully to avoid injury to the loops of chronic leads. Extensive dissection was not necessary, but note was made of a thick and  calcified capsule. The device was explanted. The pocket was carefully inspected for hemostasis and flushed with copious amounts of antibiotic solution.  The leads were disconnected from the old generator and testing of the lead parameters via telemetry showed excellent values. The new generator was connected to the chronic leads, with appropriate pacing noted. The device was placed in a Tyrx pouch (pacemaker dependent patient, 3rd surgery).  The entire system was then carefully inserted in the pocket with care been taking that the leads and device assumed a comfortable position without pressure on the incision. Great care was taken that the leads be located deep to the generator. The pocket was then closed in layers using 2 layers of 2-0 Vicryl, one layer of 3-0 Vicryl and Steristrips after which a sterile dressing was applied.   At the end of the procedure the following lead parameters were encountered:   Right atrial lead sensed P waves 4.4 mV, impedance 330 ohms, threshold 1.0 V at 0.4 ms pulse width.  Right ventricular lead sensed R waves  None detected, impedance 590 ohms, threshold 1.0 at 0.6 ms pulse width.  Sanda Klein, MD, [facility] [phone] office [phone] pager

## 2019-04-08 ENCOUNTER — Telehealth: Payer: Self-pay | Admitting: Cardiovascular Disease

## 2019-04-08 ENCOUNTER — Encounter (HOSPITAL_COMMUNITY): Payer: Self-pay | Admitting: Cardiovascular Disease

## 2019-04-08 NOTE — Telephone Encounter (Signed)
Returned call to patient spoke to wife.She stated when she pulled pad off device this morning she pulled some skin off too.Stated she wanted to ask if ok to put neosporin oint on the skin.Stated she also wanted verify the directions on how to take care of incision.Advised I will send message to device clinic.

## 2019-04-08 NOTE — Telephone Encounter (Signed)
Occlusive dressing was removed from incision site today and patient reports area of red irritated skin that was under edge of occlusive dressing where it was adhered to skin. No bleeding, drainage or warmth at site that is below where steri-strips are located. Advised to keep area clean with anti-bacterial soap and pat area dry with soft cloth. Instructed to  avoid getting steri-strips wet. Will call clinic if develops sign and symptoms of infection.

## 2019-04-08 NOTE — Telephone Encounter (Signed)
New Message:   Please call, question about caring for his incision. Pt says it is hurting really bad.

## 2019-04-10 DIAGNOSIS — E1122 Type 2 diabetes mellitus with diabetic chronic kidney disease: Secondary | ICD-10-CM | POA: Diagnosis not present

## 2019-04-11 ENCOUNTER — Encounter: Payer: Self-pay | Admitting: Neurology

## 2019-04-11 ENCOUNTER — Ambulatory Visit (INDEPENDENT_AMBULATORY_CARE_PROVIDER_SITE_OTHER): Payer: Medicare Other | Admitting: Neurology

## 2019-04-11 ENCOUNTER — Ambulatory Visit (INDEPENDENT_AMBULATORY_CARE_PROVIDER_SITE_OTHER): Payer: Medicare Other | Admitting: Podiatry

## 2019-04-11 ENCOUNTER — Other Ambulatory Visit: Payer: Self-pay

## 2019-04-11 ENCOUNTER — Encounter: Payer: Self-pay | Admitting: Podiatry

## 2019-04-11 VITALS — BP 117/68 | HR 88 | Ht 67.0 in | Wt 258.0 lb

## 2019-04-11 DIAGNOSIS — G459 Transient cerebral ischemic attack, unspecified: Secondary | ICD-10-CM | POA: Diagnosis not present

## 2019-04-11 DIAGNOSIS — E1140 Type 2 diabetes mellitus with diabetic neuropathy, unspecified: Secondary | ICD-10-CM | POA: Diagnosis not present

## 2019-04-11 DIAGNOSIS — Z794 Long term (current) use of insulin: Secondary | ICD-10-CM

## 2019-04-11 DIAGNOSIS — M79674 Pain in right toe(s): Secondary | ICD-10-CM | POA: Diagnosis not present

## 2019-04-11 DIAGNOSIS — I2510 Atherosclerotic heart disease of native coronary artery without angina pectoris: Secondary | ICD-10-CM

## 2019-04-11 DIAGNOSIS — M79675 Pain in left toe(s): Secondary | ICD-10-CM

## 2019-04-11 DIAGNOSIS — B351 Tinea unguium: Secondary | ICD-10-CM | POA: Diagnosis not present

## 2019-04-11 NOTE — Progress Notes (Signed)
NEUROLOGY FOLLOW UP OFFICE NOTE  [name] [id number] 03-Jul-1931  HISTORY OF PRESENT ILLNESS: I had the pleasure of seeing Remijio Holleran in follow-up in the neurology clinic on 04/11/2019.  The patient was last seen 3 months ago after a transient episode of alexia. Head CT unremarkable, unable to do MRI due to pacemaker. His CTA neck reported 60% left ICA stenosis, however carotid dopplers only showed 40-59% left ICA stenosis and aggressive risk factor modification was recommended, as well as carotid doppler every 6 months. He is on dual antiplatelet therapy. BP today 117/68. He was also reporting episodes of gaps in time and a transient episode of left hand numbness/weakness. He had a routine ane 24-hour EEG which were normal, however typical events were not captured. Since his last visit, he denies any further episodes of alexia. He denies any episodes of transient vision loss. He has occasional memory issues where he cannot retrieve a name, such as [employer], but it later comes back. He has noticed he is making more typical errors. He denies any further gaps in time or transient weakness, but sometimes notices that he would drop his phone from the left hand after holding it up for 20 minutes. He denies any headaches, dizziness, vision changes, no falls.   History on Initial Assessment 12/30/2018: This is a pleasant 85 year old right-handed man with a history of diabetes, CKD, hypothyroidism, OSA on CPAP, CAD, cardiomyopathy, sinus node dysfunction s/p pacemaker placement, presenting for evaluation of TIA. Hospital records were reviewed. On 11/24/2018, after coming home from shopping, he felt sort of strange. He turned on the computer and when it came on, he could not understand anything on it, appearing like hieroglyphics. He could not understand the letters on the keyboard. He could talk and alerted his family. He recalls looking at the clock and the hands were appearing and disappearing.  Family drove him to the ER, and along the way initially he could not recognize/read the street signs, then a few minutes later he started recognizing them. He denied any headache, focal numbness/tingling/weakness.  He has chronic tinnitus and recalls the tinnitus was louder than usual and he felt very hot. In the ER, he had 2 head CTs which did not show any significant abnormalities. He had CTA head and neck reporting 60% left ICA stenosis,then carotid dopplers which only showed 40-59% left ICA stenosis. Revascularization was not recommended at that time, aggressive risk factor modification and dual antiplatelet therapy recommended, with 6 monthly carotid ultrasound studies. Echo showed normal EF, LDL 74, HbA1c 7.1. He was taking aspirin and Plavix prior to hospital admission, no changes made. He had orthostatic hypotension and medications were adjusted by Cardiology. He denies any further similar spells since then, but reports episodes where there would be momentary pauses, he would be doing something then "boom" then comes back. He recalls a similar instance in the 1980s and had his pacemaker placed, and looking back at his diary, he has had these spells since January 2020. Usually they occur when he is doing something boring like driving and suddenly "gets this moment." He reports a different incident 2 nights ago when all of a sudden he could not raise his left arm and the hand felt numb, "fingers like hotdogs," lasting 3-4 minutes. Leg and face were unaffected, he could read and talk to his wife. They report his blood sugar was very low in the 70s and he took Glucerna. They did not check his BP. Since hospital  discharge, he has noticed difficulties starting a sentence, he would start with the wrong word and catch himself. He has noticed mistakes when writing. I reviewed his symptom diary, in January he had a fainting sensation while at the computer, the screen became very bright and blurry and he felt like he  was vanishing. He could hardly find his pulse and called his family. They noted his BP was quite low but not "scary low." He had a second one that month again on the computer where the back of his eyes hurt and for a short time the screen was too bright to see, similarly he could not find his pulse at the wrist. In February, he woke up with fingers feeling like hotdogs. In March, he again had an episode where he was starting to fade, revived with Glucerna. He states he realized the sensitivity to light from the computer and migraine brain fog was due to hypoglycemia.   [name] denies any staring/unresponsive episodes, he denies any olfactory/gustatory hallucinations, myoclonic jerks. He has vertigo when sleeping on his right side and feels wobbly when walking. No paresthesias in his feet but they feel numb like he is wearing shoes with thick rubber. He had a normal birth and early development, no history of febrile convulsions, CNS infections, significant head injuries, family history of seizures.    PAST MEDICAL HISTORY: Past Medical History:  Diagnosis Date   Abnormal Doppler ultrasound of carotid artery 12/16/2012   mildly abnormal doppler . no prior studies to compare to. follow up studies are recommended when clinically indicated.   Acute respiratory failure with hypoxia (HCC) 11/2018   Blepharitis    Cancer (HCC)    Carotid stenosis, left 11/2018   on left 60%   Diabetes mellitus without complication (HCC)    Exogenous obesity    Gastric ulcer    GI bleed    History of stress test 06/06/2012   low risk scan. fixed inferior defect consistant with prior infarction with moderatly reduced EF. no appreciable evidence of ischemia. no significant change from previous study   Hx of echocardiogram 08-15-2012   EF  45-50 % very difficult study, severe LVH, pacemaker, trace TR,mildly enlarged RV,; aorta mildly calcified   Pacemaker 2011   St. Jude Accent DDDR ; this is the  third generator this pt. has had   Shingles    TIA (transient ischemic attack) 11/2018   vs stroke not seen on CT   UTI (lower urinary tract infection)     MEDICATIONS: Current Outpatient Medications on File Prior to Visit  Medication Sig Dispense Refill   acetaminophen (TYLENOL) 500 MG tablet Take 500 mg by mouth 2 (two) times daily.      aspirin 81 MG tablet Take 81 mg by mouth every evening.      BD VEO INSULIN SYRINGE U/F 31G X 15/64" 1 ML MISC USE TO ADMINSTER INSULIN SYRINGE TWICE DAILY     calcitRIOL (ROCALTROL) 0.25 MCG capsule Take 0.25 mcg by mouth every Monday, Wednesday, and Friday. evening     Cholecalciferol (VITAMIN D) 50 MCG (2000 UT) tablet Take 2,000 Units by mouth every evening.     clopidogrel (PLAVIX) 75 MG tablet Take 75 mg by mouth every evening.      Coenzyme Q10 (COQ10) 100 MG CAPS Take 100 mg by mouth every other day. Alternate days with 200 mg     Coenzyme Q10 (COQ10) 200 MG CAPS Take 200 mg by mouth every other day. Alternate days  with 100 mg     docusate sodium (COLACE) 100 MG capsule Take 100 mg by mouth 2 (two) times daily.      Emollient (UDDERLY SMOOTH EX) Apply 1 application topically daily.     famotidine (PEPCID) 40 MG tablet Take 40 mg by mouth at bedtime.     furosemide (LASIX) 20 MG tablet Take 20 mg by mouth every evening.     insulin NPH-regular Human (70-30) 100 UNIT/ML injection Inject 55-65 Units into the skin 2 (two) times daily with a meal.      irbesartan (AVAPRO) 150 MG tablet Take 150 mg by mouth daily.     levothyroxine (SYNTHROID, LEVOTHROID) 25 MCG tablet Take 25 mcg by mouth daily.      metoprolol succinate (TOPROL-XL) 25 MG 24 hr tablet TAKE 1 & 1/2 (ONE & ONE-HALF) TABLETS BY MOUTH DAILY (Patient taking differently: Take 37.5 mg by mouth every evening. ) 135 tablet 2   nitroGLYCERIN (NITROSTAT) 0.4 MG SL tablet Place 1 tablet (0.4 mg total) under the tongue every 5 (five) minutes as needed for chest pain. 25 tablet 1    Omega-3 Fatty Acids (FISH OIL) 1200 MG CAPS Take 2,400 mg by mouth every evening.     potassium chloride (KLOR-CON) 10 MEQ tablet Take 2 tablets by mouth once daily (Patient taking differently: Take 20 mEq by mouth every evening. ) 180 tablet 0   rosuvastatin (CRESTOR) 40 MG tablet Take 40 mg by mouth every evening.      vitamin C (ASCORBIC ACID) 500 MG tablet Take 500 mg by mouth every evening.      vitamin E 400 UNIT capsule Take 400 Units by mouth every evening.      Current Facility-Administered Medications on File Prior to Visit  Medication Dose Route Frequency Provider Last Rate Last Dose   betamethasone acetate-betamethasone sodium phosphate (CELESTONE) injection 3 mg  3 mg Intramuscular Once Edrick Kins, DPM        ALLERGIES: No Known Allergies  FAMILY HISTORY: No family history on file.  SOCIAL HISTORY: Social History   Socioeconomic History   Marital status: Married    Spouse name: Not on file   Number of children: Not on file   Years of education: Not on file   Highest education level: Not on file  Occupational History   Not on file  Social Needs   Financial resource strain: Not on file   Food insecurity    Worry: Not on file    Inability: Not on file   Transportation needs    Medical: Not on file    Non-medical: Not on file  Tobacco Use   Smoking status: Never Smoker   Smokeless tobacco: Former Systems developer  Substance and Sexual Activity   Alcohol use: Yes    Comment: daily wine   Drug use: No   Sexual activity: Not Currently    Partners: Female  Lifestyle   Physical activity    Days per week: Not on file    Minutes per session: Not on file   Stress: Not on file  Relationships   Social connections    Talks on phone: Not on file    Gets together: Not on file    Attends religious service: Not on file    Active member of club or organization: Not on file    Attends meetings of clubs or organizations: Not on file    Relationship  status: Not on file   Intimate partner violence  Fear of current or ex partner: Not on file    Emotionally abused: Not on file    Physically abused: Not on file    Forced sexual activity: Not on file  Other Topics Concern   Not on file  Social History Narrative   Right handed      Lives with wife, son and daughter in law      Highest level of edu- doctorate    REVIEW OF SYSTEMS: Constitutional: No fevers, chills, or sweats, no generalized fatigue, change in appetite Eyes: No visual changes, double vision, eye pain Ear, nose and throat: No hearing loss, ear pain, nasal congestion, sore throat Cardiovascular: No chest pain, palpitations Respiratory:  No shortness of breath at rest or with exertion, wheezes GastrointestinaI: No nausea, vomiting, diarrhea, abdominal pain, fecal incontinence Genitourinary:  No dysuria, urinary retention or frequency Musculoskeletal:  No neck pain, back pain Integumentary: No rash, pruritus, skin lesions Neurological: as above Psychiatric: No depression, insomnia, anxiety Endocrine: No palpitations, fatigue, diaphoresis, mood swings, change in appetite, change in weight, increased thirst Hematologic/Lymphatic:  No anemia, purpura, petechiae. Allergic/Immunologic: no itchy/runny eyes, nasal congestion, recent allergic reactions, rashes  PHYSICAL EXAM: Vitals:   04/11/19 1539  BP: 117/68  Pulse: 88  SpO2: 94%   General: No acute distress Head:  Normocephalic/atraumatic Skin/Extremities: No rash, no edema Neurological Exam: alert and oriented to person, place, and time. No aphasia or dysarthria. Fund of knowledge is appropriate.  Recent and remote memory are intact.  Attention and concentration are normal.    Able to name objects and repeat phrases. Cranial nerves: Pupils equal, round, reactive to light. Extraocular movements intact with no nystagmus. Visual fields full. No facial asymmetry. Tongue, uvula, palate midline.  Motor: Bulk and tone  normal, muscle strength 5/5 throughout with no pronator drift.  Finger to nose testing intact.  Gait wide-based, no ataxia.   IMPRESSION: This is a pleasant 85 yo RH man with a history of diabetes, CKD, hypothyroidism, OSA on CPAP, CAD, cardiomyopathy, sinus node dysfunction s/p pacemaker placement, with TIA last July 2020 with transient episode suggestive of alexia (inability to read), he could speak without difficulties, no focal symptoms or headache. Unable to do MRI brain due to pacemaker, head CT no changes. Symptoms felt to be due to left hemispheric TIA due to left carotid disease, initial CTA reported 60% left ICA stenosis, however only 40-59% on carotid doppler, hence revascularization was not recommended. He is on dual antiplatelet, continue control of vascular risk factors. We discussed recommendation to repeat carotid ultrasound every 6 months, he will follow-up with Cardiology for this. He was also reporting some brief gaps in time, 24-hour EEG normal. He denies any further concerning episodes since July 2020, he knows to go to the ER for any sudden change in symptoms, follow-up prn. He knows to call for any changes.  Thank you for allowing me to participate in his care.  Please do not hesitate to call for any questions or concerns.   Ellouise Newer, M.D.   CC: Dr. Stephanie Acre, Dr. Sallyanne Kuster

## 2019-04-11 NOTE — Patient Instructions (Signed)
Great seeing you! Continue all your medications. Recommend carotid ultrasound every 6 months, I will send a message to Dr. Sallyanne Kuster to follow-up. Take care, follow-up as needed, call for any changes.

## 2019-04-11 NOTE — Progress Notes (Signed)
Complaint:  Visit Type: Patient returns to my office for continued preventative foot care services. Complaint: Patient states" my nails have grown long and thick and become painful to walk and wear shoes" Patient has been diagnosed with DM with no foot complications. The patient presents for preventative foot care services. No changes to ROS.  Patient is taking plavix.  Podiatric Exam: Vascular: dorsalis pedis and posterior tibial pulses are palpable bilateral. Capillary return is immediate. Temperature gradient is WNL. Skin turgor WNL  Sensorium: Normal Semmes Weinstein monofilament test. Normal tactile sensation bilaterally. Nail Exam: Pt has thick disfigured discolored nails with subungual debris noted bilateral entire nail hallux through fifth toenails Ulcer Exam: There is no evidence of ulcer or pre-ulcerative changes or infection. Orthopedic Exam: Muscle tone and strength are WNL. No limitations in general ROM. No crepitus or effusions noted. Foot type and digits show no abnormalities. Bony prominences are unremarkable. Skin: No Porokeratosis. No infection or ulcers  Diagnosis:  Onychomycosis, , Pain in right toe, pain in left toes  Treatment & Plan Procedures and Treatment: Consent by patient was obtained for treatment procedures. The patient understood the discussion of treatment and procedures well. All questions were answered thoroughly reviewed. Debridement of mycotic and hypertrophic toenails, 1 through 5 bilateral and clearing of subungual debris. No ulceration, no infection noted.   Return Visit-Office Procedure: Patient instructed to return to the office for a follow up visit 3 months for continued evaluation and treatment.    [name] DPM

## 2019-04-14 ENCOUNTER — Other Ambulatory Visit: Payer: Self-pay | Admitting: *Deleted

## 2019-04-14 DIAGNOSIS — I6529 Occlusion and stenosis of unspecified carotid artery: Secondary | ICD-10-CM

## 2019-04-14 DIAGNOSIS — G459 Transient cerebral ischemic attack, unspecified: Secondary | ICD-10-CM

## 2019-04-14 NOTE — Progress Notes (Signed)
Happy Thanksgiving to you as well

## 2019-04-14 NOTE — Progress Notes (Signed)
We will gladly do so!

## 2019-04-18 ENCOUNTER — Other Ambulatory Visit: Payer: Self-pay | Admitting: Cardiovascular Disease

## 2019-04-21 NOTE — Progress Notes (Signed)
Cardiology Office Note Date:  04/23/2019  Patient ID:  [name], [name] 10-Mar-1932, MRN [id number] PCP:  [name], MD  Cardiologist:  Dr. Sallyanne Kuster     Chief Complaint: wound check visit  History of Present Illness: [name] is a 85 y.o. 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  He comes in today to be seeen for Dr. Sallyanne Kuster, s/p PPM gen change/wound check visit.  He is doing well, though c/w the same DOE, this he thinks has more to do with his kidney disease.  No rest SOB, no dizziness, near syncope or syncope.  No CP, no palpitations.   He has not had any concerns with his pacer site (outside of the skin tear he got when his daughter in law removed his outer bandage).    He asks about having his carotid arteries followed up on given his h/o stroke  Device information SJM dual chamber PPM, implanted 1990 > gen changes 2002, 2011, 04/07/2019 (Tyrex pouch used)   Past Medical History:  Diagnosis Date  . Abnormal Doppler ultrasound of carotid artery 12/16/2012   mildly abnormal doppler . no prior studies to compare to. follow up studies are recommended when clinically indicated.  . Acute respiratory failure with hypoxia ([place]) 11/2018  . Blepharitis   . Cancer ([place])   . Carotid stenosis, left 11/2018   on left 60%  . Diabetes mellitus without complication ([place])   . Exogenous obesity   . Gastric ulcer   . GI bleed   . History of stress test 06/06/2012   low risk scan. fixed inferior defect consistant with prior infarction with moderatly reduced EF. no appreciable evidence of ischemia. no significant change from previous study  . Hx of echocardiogram 08-15-2012   EF  45-50 % very difficult study, severe LVH, pacemaker, trace TR,mildly enlarged RV,; aorta mildly calcified  . Pacemaker 2011   [employer] Jude Accent DDDR ; this is the third generator this pt. has had   . Shingles   . TIA (transient ischemic attack) 11/2018   vs stroke not seen on CT  . UTI (lower urinary tract infection)     Past Surgical History:  Procedure Laterality Date  . CARDIAC CATHETERIZATION  12-29-2003   rt. coronary angiography was done with a 6 french 4 cm taper coronary cath. This demonstrated  approximately 70 to 75% mildly segmental stenosis in the mid RCA before the acute margin          . CARDIAC CATHETERIZATION  11-19-2003   diagnostic cath,, pt. to get second cath to have stent placed  . cardiac stents    . CATARACT EXTRACTION    . KNEE SURGERY Right   . PPM GENERATOR CHANGEOUT N/A 04/07/2019   Procedure: PPM GENERATOR CHANGEOUT;  Surgeon: Sanda Klein, MD;  Location: [facility];  Service: Cardiovascular;  Laterality: N/A;  . PROSTATE BIOPSY    . prostate seed implants    . SPINE SURGERY    . WISDOM TOOTH EXTRACTION      Current Outpatient Medications  Medication Sig Dispense Refill  . acetaminophen (TYLENOL) 500 MG tablet Take 500 mg by mouth 2 (two) times daily.     Marland Kitchen aspirin 81 MG tablet Take 81 mg by mouth every evening.     . BD VEO INSULIN SYRINGE U/F 31G X 15/64" 1 ML MISC USE TO ADMINSTER INSULIN  SYRINGE TWICE DAILY    . calcitRIOL (ROCALTROL) 0.25 MCG capsule Take 0.25 mcg by mouth every Monday, Wednesday, and Friday. evening    . Cholecalciferol (VITAMIN D) 50 MCG (2000 UT) tablet Take 2,000 Units by mouth every evening.    . clopidogrel (PLAVIX) 75 MG tablet Take 1 tablet by mouth once daily 90 tablet 3  . Coenzyme Q10 (COQ10) 100 MG CAPS Take 100 mg by mouth every other day. Alternate days with 200 mg    . Coenzyme Q10 (COQ10) 200 MG CAPS Take 200 mg by mouth every other day. Alternate days with 100 mg    . docusate sodium (COLACE) 100 MG capsule Take 100 mg by mouth 2 (two) times daily.     . Emollient (UDDERLY SMOOTH EX) Apply 1 application topically daily.    . famotidine (PEPCID) 40 MG tablet Take 40 mg by mouth at bedtime.    .  furosemide (LASIX) 20 MG tablet Take 20 mg by mouth every evening.    . insulin NPH-regular Human (70-30) 100 UNIT/ML injection Inject 55-65 Units into the skin 2 (two) times daily with a meal.     . irbesartan (AVAPRO) 150 MG tablet Take 150 mg by mouth daily.    Marland Kitchen levothyroxine (SYNTHROID, LEVOTHROID) 25 MCG tablet Take 25 mcg by mouth daily.     . metoprolol succinate (TOPROL-XL) 25 MG 24 hr tablet TAKE 1 & 1/2 (ONE & ONE-HALF) TABLETS BY MOUTH DAILY (Patient taking differently: Take 37.5 mg by mouth every evening. ) 135 tablet 2  . nitroGLYCERIN (NITROSTAT) 0.4 MG SL tablet Place 1 tablet (0.4 mg total) under the tongue every 5 (five) minutes as needed for chest pain. 25 tablet 1  . Omega-3 Fatty Acids (FISH OIL) 1200 MG CAPS Take 2,400 mg by mouth every evening.    . potassium chloride (KLOR-CON) 10 MEQ tablet Take 2 tablets by mouth once daily (Patient taking differently: Take 20 mEq by mouth every evening. ) 180 tablet 0  . rosuvastatin (CRESTOR) 40 MG tablet Take 1 tablet by mouth once daily 90 tablet 3  . vitamin C (ASCORBIC ACID) 500 MG tablet Take 500 mg by mouth every evening.     . vitamin E 400 UNIT capsule Take 400 Units by mouth every evening.      Current Facility-Administered Medications  Medication Dose Route Frequency Provider Last Rate Last Dose  . betamethasone acetate-betamethasone sodium phosphate (CELESTONE) injection 3 mg  3 mg Intramuscular Once Edrick Kins, DPM        Allergies:   Patient has no known allergies.   Social History:  The patient  reports that he has never smoked. He has quit using smokeless tobacco. He reports current alcohol use. He reports that he does not use drugs.   Family History:  The patient's family history includes Heart disease in his father.  ROS:  Please see the history of present illness.  All other systems are reviewed and otherwise negative.   PHYSICAL EXAM:  VS:  BP (!) 134/58   Pulse 71   Ht 5\' 7"  (1.702 m)   Wt 263 lb  (119.3 kg)   BMI 41.19 kg/m  BMI: Body mass index is 41.19 kg/m. Well nourished, well developed, in no acute distress  HEENT: normocephalic, atraumatic  Neck: no JVD, carotid bruits or masses Cardiac:  RRR; no significant murmurs, no rubs, or gallops Lungs:  CTA b/l, no wheezing, rhonchi or rales  Abd: soft, nontender, obese MS: no deformity  or atrophy Ext: trace edema  Skin: warm and dry, no rash Neuro:  No gross deficits appreciated Psych: euthymic mood, full affect  PPM site: steri strips are removed without difficulty, wound edges are well approximated, no swelling, bleeding, erythema, no increased heat to the surrounding tissues.  No evidence of infection.  Healing well.  Inferious to the pacer wound healing/scabbed skin tear, stable   EKG:  Not done today PPM interrogation done today and reviewed by myself Battery and lead measurements are stable No arrhythmias  He is V paced at 45 and symptomatic with this rate   11/25/2018: TTE IMPRESSIONS 1. The left ventricle has low normal systolic function, with an ejection fraction of 50-55%. The cavity size was normal. Left ventricular diastolic function could not be evaluated due to nondiagnostic images. No evidence of left ventricular regional  wall motion abnormalities.  2. Left atrial size was not well visualized.  3. The mitral valve was not well visualized. There is moderate mitral annular calcification present.  4. The aortic valve is grossly normal. Aortic valve regurgitation was not assessed by color flow Doppler.  5. No intracardiac thrombi or masses were visualized.  6. The interatrial septum was not assessed.   11/25/2018: Carotid US Summary: Right Carotid: Velocities in the right ICA are consistent with a 1-39% stenosis.  Left Carotid: Velocities in the left ICA are consistent with a 40-59% stenosis.  Vertebrals: Bilateral vertebral arteries demonstrate antegrade flow.   06/06/2012: lexiscan stress myoview  Overall Impression:  Low risk stress nuclear study. Fixed inferior defect consistent with prior infarction.  with moderately reduced EF.  No appreciable evidence of ischemia.   Recent Labs: 04/03/2019: ALT 21; BUN 39; Creatinine, Ser 1.99; Hemoglobin 16.4; Platelets 177; Potassium 5.2; Sodium 141  11/24/2018: Cholesterol 156; HDL 35; LDL Cholesterol 74; Total CHOL/HDL Ratio 4.5; Triglycerides 236; VLDL 47   Estimated Creatinine Clearance: 33.5 mL/min (A) (by C-G formula based on SCr of 1.99 mg/dL (H)).   Wt Readings from Last 3 Encounters:  04/23/19 263 lb (119.3 kg)  04/11/19 258 lb (117 kg)  04/07/19 258 lb (117 kg)     Other studies reviewed: Additional studies/records reviewed today include: summarized above  ASSESSMENT AND PLAN:  1. PPM s/p gen change     Site is healing well     No programming changes made  2. HTN     Looks OK, no changes made  3. DOE     Not new     Felt to be 2/2 body habitus and chronic metabolic issues    Disposition: I recommended annual carotid US, though he states he was told by his neurologist should be more frequent, I will defer to Dr. Sallyanne Kuster, He sess him in March.  We will continue Q 3 mo remote checks and annual in clinic checks, otherwise.    Current medicines are reviewed at length with the patient today.  The patient did not have any concerns regarding medicines.  Venetia Night, PA-C 04/23/2019 5:23 PM     [address] [place] [address] [phone] (office)  [phone] (fax)

## 2019-04-23 ENCOUNTER — Ambulatory Visit (INDEPENDENT_AMBULATORY_CARE_PROVIDER_SITE_OTHER): Payer: Medicare Other | Admitting: Physician Assistant

## 2019-04-23 ENCOUNTER — Other Ambulatory Visit: Payer: Self-pay

## 2019-04-23 ENCOUNTER — Encounter: Payer: Self-pay | Admitting: Physician Assistant

## 2019-04-23 VITALS — BP 134/58 | HR 71 | Ht 67.0 in | Wt 263.0 lb

## 2019-04-23 DIAGNOSIS — I6529 Occlusion and stenosis of unspecified carotid artery: Secondary | ICD-10-CM

## 2019-04-23 DIAGNOSIS — Z950 Presence of cardiac pacemaker: Secondary | ICD-10-CM | POA: Diagnosis not present

## 2019-04-23 DIAGNOSIS — R0600 Dyspnea, unspecified: Secondary | ICD-10-CM | POA: Diagnosis not present

## 2019-04-23 DIAGNOSIS — Z5189 Encounter for other specified aftercare: Secondary | ICD-10-CM

## 2019-04-23 DIAGNOSIS — R0609 Other forms of dyspnea: Secondary | ICD-10-CM

## 2019-04-23 DIAGNOSIS — I10 Essential (primary) hypertension: Secondary | ICD-10-CM

## 2019-04-23 NOTE — Patient Instructions (Signed)
  Medication Instructions:   Your physician recommends that you continue on your current medications as directed. Please refer to the Current Medication list given to you today.  *If you need a refill on your cardiac medications before your next appointment, please call your pharmacy*  Lab Work: [place]   If you have labs (blood work) drawn today and your tests are completely normal, you will receive your results only by: Marland Kitchen MyChart Message (if you have MyChart) OR . A paper copy in the mail If you have any lab test that is abnormal or we need to change your treatment, we will call you to review the results.  Testing/Procedures: NONE ORDERED  TODAY   Follow-Up: At [facility], you and your health needs are our priority.  As part of our continuing mission to provide you with exceptional heart care, we have created designated Provider Care Teams.  These Care Teams include your primary Cardiologist (physician) and Advanced Practice Providers (APPs -  Physician Assistants and Nurse Practitioners) who all work together to provide you with the care you need, when you need it.  Your next appointment:  AS SCHEDULED WITH  Provider:   Sanda Klein, MD    Other Instructions

## 2019-04-30 ENCOUNTER — Telehealth: Payer: Self-pay | Admitting: Cardiovascular Disease

## 2019-04-30 DIAGNOSIS — N1832 Chronic kidney disease, stage 3b: Secondary | ICD-10-CM

## 2019-04-30 NOTE — Telephone Encounter (Signed)
Spoke to patient he stated he wanted Dr.Croitoru to refer him to nutritionist listed below.Advised I will send message to him for a order.

## 2019-04-30 NOTE — Telephone Encounter (Signed)
The referral has been placed. The patient has been notified.

## 2019-04-30 NOTE — Telephone Encounter (Signed)
New message   Patient's wife wants to know if Dr. Sallyanne Kuster  Can give a referral to the nutritionist Antonieta Iba at [phone]. Please advise.

## 2019-05-05 DIAGNOSIS — L821 Other seborrheic keratosis: Secondary | ICD-10-CM | POA: Diagnosis not present

## 2019-05-05 DIAGNOSIS — L918 Other hypertrophic disorders of the skin: Secondary | ICD-10-CM | POA: Diagnosis not present

## 2019-05-05 DIAGNOSIS — Z85828 Personal history of other malignant neoplasm of skin: Secondary | ICD-10-CM | POA: Diagnosis not present

## 2019-05-05 DIAGNOSIS — L570 Actinic keratosis: Secondary | ICD-10-CM | POA: Diagnosis not present

## 2019-05-05 DIAGNOSIS — D225 Melanocytic nevi of trunk: Secondary | ICD-10-CM | POA: Diagnosis not present

## 2019-05-05 DIAGNOSIS — D1801 Hemangioma of skin and subcutaneous tissue: Secondary | ICD-10-CM | POA: Diagnosis not present

## 2019-05-14 ENCOUNTER — Ambulatory Visit: Payer: Medicare Other | Admitting: Neurology

## 2019-05-17 ENCOUNTER — Other Ambulatory Visit: Payer: Self-pay | Admitting: Cardiovascular Disease

## 2019-05-26 ENCOUNTER — Encounter: Payer: Self-pay | Admitting: Dietician

## 2019-05-26 ENCOUNTER — Other Ambulatory Visit: Payer: Self-pay

## 2019-05-26 ENCOUNTER — Encounter: Payer: Medicare Other | Attending: Family Medicine | Admitting: Dietician

## 2019-05-26 DIAGNOSIS — Z794 Long term (current) use of insulin: Secondary | ICD-10-CM | POA: Diagnosis not present

## 2019-05-26 DIAGNOSIS — E1121 Type 2 diabetes mellitus with diabetic nephropathy: Secondary | ICD-10-CM | POA: Insufficient documentation

## 2019-05-26 DIAGNOSIS — N1832 Chronic kidney disease, stage 3b: Secondary | ICD-10-CM | POA: Insufficient documentation

## 2019-05-26 NOTE — Patient Instructions (Addendum)
Avoid added salt.  Limit or avoid processed meat.  Decreased portion of cheese. Season with herbs and spices, garlic onions, lemon or vinegar Never use "No Salt" or "Lite Salt" as they contain potassium chloride. Discuss your potassium supplement with your MD. Avoid foods that have potassium or phos... in the ingredient list. Limit dairy foods to 1 serving per day.  Avoid snacks unless you are hungry.   Eat slowly and stop when you are satisfied. Have vegetables with each meal.  You only need to follow a low potassium diet if your blood potassium is high and you are no longer on the potassium supplement.  Choose only small portions of meat. Continue to stay active.    Avoid drinking carbohydrate.  Drink water rather than juice.  Resources:  Kidney.org  [place].com

## 2019-05-26 NOTE — Progress Notes (Signed)
Diabetes Self-Management Education  Visit Type: First/Initial  Appt. Start Time: 1530 Appt. End Time: 3833  05/26/2019  Mr. [name], identified by name and date of birth, is a 86 y.o. male with a diagnosis of Diabetes: Type 2.   ASSESSMENT Patient is here today with his wife.  He would like to learn more about nutrition and diabetes as well as kidney's. He states that his kidney doctor wanted him to limit potassium and phosphorous and states he had a different opinion from the cardiologist.    History includes Type 2 Diabetes (1990), stage 4 CKD A1C7% 11/24/2018 but BG uncontrolled often. Other labs include:  BUN 39, Creatinine 1.99, eGFR 30, Potasium 5.2 04/02/89. Medications include NPH 65 units before breakfast and dinner.  He decreases or increases the amount based on his BG.  He lives with his wife.  His son and daughter-in-law also lives with them (in a separate apartment in the house).Marland Kitchen   His wife does the cooking. They do not have an oven and use a toaster oven.  She uses a 2 burner cook top, mini refrigerator, slow cooker and microwave.  Their daughter in law will also bring them food. He has been working on balance and strengthening exercises daily for 15 minutes. He is a retired [employer] manager.  He was born in [place], lived in [place] and then moved to the [place] with his family as young adults.   Height _0  (1.702 m), weight 260 lb (117.9 kg). Body mass index is 40.72 kg/m.  Diabetes Self-Management Education - 05/26/19 1555      Visit Information   Visit Type  First/Initial      Initial Visit   Diabetes Type  Type 2    Are you currently following a meal plan?  No    Are you taking your medications as prescribed?  Yes    Date Diagnosed  1990      Health Coping   How would you rate your overall health?  Fair      Psychosocial Assessment   Patient Belief/Attitude about Diabetes  Motivated to manage diabetes    Self-care barriers  None    Self-management support   Doctor's office    Other persons present  Patient;Spouse/SO    Patient Concerns  Nutrition/Meal planning    Special Needs  None    Preferred Learning Style  No preference indicated    Learning Readiness  Ready    How often do you need to have someone help you when you read instructions, pamphlets, or other written materials from your doctor or pharmacy?  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  Architect      Pre-Education Assessment   Patient understands the diabetes disease and treatment process.  Needs Review    Patient understands incorporating nutritional management into lifestyle.  Needs Review    Patient undertands incorporating physical activity into lifestyle.  Needs Review    Patient understands using medications safely.  Needs Review    Patient understands monitoring blood glucose, interpreting and using results  Needs Review    Patient understands prevention, detection, and treatment of acute complications.  Needs Review    Patient understands prevention, detection, and treatment of chronic complications.  Needs Review    Patient understands how to develop strategies to address psychosocial issues.  Needs Review    Patient understands how to develop strategies to promote health/change behavior.  Needs Review  Complications   Last HgB A1C per patient/outside source  7 %   09/24/2018   How often do you check your blood sugar?  1-2 times/day    Fasting Blood glucose range (mg/dL)  130-179;70-129   130-140   Postprandial Blood glucose range (mg/dL)  >200;180-200    Number of hypoglycemic episodes per month  0    Number of hyperglycemic episodes per week  7    Can you tell when your blood sugar is high?  No    Have you had a dilated eye exam in the past 12 months?  Yes    Have you had a dental exam in the past 12 months?  Yes    Are you checking your feet?  Yes    How many days per week are you checking your feet?  7      Dietary Intake    Breakfast  2 eggs, 2 slices white bread (plain), fruit, coffee with 1/2 cup whole milk and 4 tsp sugar    Snack (morning)  none    Lunch  leftovers   2-3   Snack (afternoon)  none    Dinner  cauliflower and potatoe soup, chicken, rotini, vegetables, fruit OR   7   Snack (evening)  2 hard breaksticks, yogurt, cheese, occasional ice cream    Beverage(s)  water, diet gingerale, coffee with whole milk and 4 tsp sugar, cranberry juice      Exercise   Exercise Type  Light (walking / raking leaves)    How many days per week to you exercise?  7    How many minutes per day do you exercise?  15    Total minutes per week of exercise  105      Patient Education   Previous Diabetes Education  Yes (please comment)   20 years ago   Nutrition management   Role of diet in the treatment of diabetes and the relationship between the three main macronutrients and blood glucose level;Meal options for control of blood glucose level and chronic complications.;Other (comment)   renal diet, mindfulness   Physical activity and exercise   Role of exercise on diabetes management, blood pressure control and cardiac health.    Medications  Reviewed patients medication for diabetes, action, purpose, timing of dose and side effects.    Chronic complications  Identified and discussed with patient  current chronic complications      Individualized Goals (developed by patient)   Nutrition  General guidelines for healthy choices and portions discussed    Physical Activity  Exercise 5-7 days per week;15 minutes per day    Medications  take my medication as prescribed    Monitoring   test my blood glucose as discussed    Health Coping  discuss diabetes with (comment)      Post-Education Assessment   Patient understands the diabetes disease and treatment process.  Demonstrates understanding / competency    Patient understands incorporating nutritional management into lifestyle.  Needs Review    Patient undertands  incorporating physical activity into lifestyle.  Demonstrates understanding / competency    Patient understands using medications safely.  Demonstrates understanding / competency    Patient understands monitoring blood glucose, interpreting and using results  Demonstrates understanding / competency    Patient understands prevention, detection, and treatment of acute complications.  Demonstrates understanding / competency    Patient understands prevention, detection, and treatment of chronic complications.  Demonstrates understanding / competency    Patient  understands how to develop strategies to address psychosocial issues.  Demonstrates understanding / competency    Patient understands how to develop strategies to promote health/change behavior.  Needs Review      Outcomes   Expected Outcomes  Demonstrated interest in learning. Expect positive outcomes    Future DMSE  PRN    Program Status  Completed       Individualized Plan for Diabetes Self-Management Training:   Learning Objective:  Patient will have a greater understanding of diabetes self-management. Patient education plan is to attend individual and/or group sessions per assessed needs and concerns.   Plan:   Patient Instructions  Avoid added salt.  Limit or avoid processed meat.  Decreased portion of cheese. Season with herbs and spices, garlic onions, lemon or vinegar Never use "No Salt" or "Lite Salt" as they contain potassium chloride. Discuss your potassium supplement with your MD. Avoid foods that have potassium or phos... in the ingredient list. Limit dairy foods to 1 serving per day.  Avoid snacks unless you are hungry.   Eat slowly and stop when you are satisfied. Have vegetables with each meal.  You only need to follow a low potassium diet if your blood potassium is high and you are no longer on the potassium supplement.  Choose only small portions of meat. Continue to stay active.    Avoid drinking  carbohydrate.  Drink water rather than juice.  Resources:  Kidney.org  [place].com   Expected Outcomes:  Demonstrated interest in learning. Expect positive outcomes  Education material provided: [facility] National Kidney Diet placemat  If problems or questions, patient to contact team via:  Phone and Email  Future DSME appointment: PRN

## 2019-05-27 ENCOUNTER — Telehealth: Payer: Self-pay | Admitting: Cardiovascular Disease

## 2019-05-27 NOTE — Telephone Encounter (Signed)
I agree with stopping the potassium supplement

## 2019-05-27 NOTE — Telephone Encounter (Signed)
Pt c/o medication issue:  1. Name of Medication: potassium chloride (KLOR-CON) 10 MEQ tablet  2. How are you currently taking this medication (dosage and times per day)? As directed  3. Are you having a reaction (difficulty breathing--STAT)? no  4. What is your medication issue? Patient saw the nutritionist, Antonieta Iba, yesterday. She states that this medication may be an issue for his kidneys. He states that she would rather him get his potassium through his diet rather than through medication. He was advised to consult with Dr. Sallyanne Kuster on getting taken off of this medication.

## 2019-05-27 NOTE — Telephone Encounter (Signed)
Pt aware of recommendations ./cy 

## 2019-05-30 DIAGNOSIS — E785 Hyperlipidemia, unspecified: Secondary | ICD-10-CM | POA: Diagnosis not present

## 2019-05-30 DIAGNOSIS — I2510 Atherosclerotic heart disease of native coronary artery without angina pectoris: Secondary | ICD-10-CM | POA: Diagnosis not present

## 2019-05-30 DIAGNOSIS — D631 Anemia in chronic kidney disease: Secondary | ICD-10-CM | POA: Diagnosis not present

## 2019-05-30 DIAGNOSIS — N189 Chronic kidney disease, unspecified: Secondary | ICD-10-CM | POA: Diagnosis not present

## 2019-05-30 DIAGNOSIS — N184 Chronic kidney disease, stage 4 (severe): Secondary | ICD-10-CM | POA: Diagnosis not present

## 2019-05-30 DIAGNOSIS — Z6841 Body Mass Index (BMI) 40.0 and over, adult: Secondary | ICD-10-CM | POA: Diagnosis not present

## 2019-05-30 DIAGNOSIS — N2581 Secondary hyperparathyroidism of renal origin: Secondary | ICD-10-CM | POA: Diagnosis not present

## 2019-05-30 DIAGNOSIS — I129 Hypertensive chronic kidney disease with stage 1 through stage 4 chronic kidney disease, or unspecified chronic kidney disease: Secondary | ICD-10-CM | POA: Diagnosis not present

## 2019-05-30 DIAGNOSIS — E1122 Type 2 diabetes mellitus with diabetic chronic kidney disease: Secondary | ICD-10-CM | POA: Diagnosis not present

## 2019-05-30 DIAGNOSIS — I495 Sick sinus syndrome: Secondary | ICD-10-CM | POA: Diagnosis not present

## 2019-06-16 DIAGNOSIS — Z794 Long term (current) use of insulin: Secondary | ICD-10-CM | POA: Diagnosis not present

## 2019-06-16 DIAGNOSIS — H52222 Regular astigmatism, left eye: Secondary | ICD-10-CM | POA: Diagnosis not present

## 2019-06-16 DIAGNOSIS — E119 Type 2 diabetes mellitus without complications: Secondary | ICD-10-CM | POA: Diagnosis not present

## 2019-06-16 DIAGNOSIS — H524 Presbyopia: Secondary | ICD-10-CM | POA: Diagnosis not present

## 2019-06-16 DIAGNOSIS — Z961 Presence of intraocular lens: Secondary | ICD-10-CM | POA: Diagnosis not present

## 2019-06-16 DIAGNOSIS — H5201 Hypermetropia, right eye: Secondary | ICD-10-CM | POA: Diagnosis not present

## 2019-06-17 DIAGNOSIS — Z7189 Other specified counseling: Secondary | ICD-10-CM | POA: Diagnosis not present

## 2019-06-17 DIAGNOSIS — E039 Hypothyroidism, unspecified: Secondary | ICD-10-CM | POA: Diagnosis not present

## 2019-06-17 DIAGNOSIS — E1165 Type 2 diabetes mellitus with hyperglycemia: Secondary | ICD-10-CM | POA: Diagnosis not present

## 2019-06-17 DIAGNOSIS — Z794 Long term (current) use of insulin: Secondary | ICD-10-CM | POA: Diagnosis not present

## 2019-06-17 DIAGNOSIS — N1831 Chronic kidney disease, stage 3a: Secondary | ICD-10-CM | POA: Diagnosis not present

## 2019-06-17 DIAGNOSIS — E1142 Type 2 diabetes mellitus with diabetic polyneuropathy: Secondary | ICD-10-CM | POA: Diagnosis not present

## 2019-06-17 DIAGNOSIS — Z6841 Body Mass Index (BMI) 40.0 and over, adult: Secondary | ICD-10-CM | POA: Diagnosis not present

## 2019-06-19 ENCOUNTER — Other Ambulatory Visit: Payer: Self-pay | Admitting: Cardiovascular Disease

## 2019-07-04 DIAGNOSIS — E039 Hypothyroidism, unspecified: Secondary | ICD-10-CM | POA: Diagnosis not present

## 2019-07-09 ENCOUNTER — Encounter: Payer: Self-pay | Admitting: Podiatry

## 2019-07-09 ENCOUNTER — Other Ambulatory Visit: Payer: Self-pay

## 2019-07-09 ENCOUNTER — Ambulatory Visit (INDEPENDENT_AMBULATORY_CARE_PROVIDER_SITE_OTHER): Payer: Medicare Other | Admitting: Podiatry

## 2019-07-09 ENCOUNTER — Telehealth: Payer: Self-pay | Admitting: Cardiovascular Disease

## 2019-07-09 DIAGNOSIS — M79675 Pain in left toe(s): Secondary | ICD-10-CM

## 2019-07-09 DIAGNOSIS — M79674 Pain in right toe(s): Secondary | ICD-10-CM

## 2019-07-09 DIAGNOSIS — B351 Tinea unguium: Secondary | ICD-10-CM

## 2019-07-09 DIAGNOSIS — E1140 Type 2 diabetes mellitus with diabetic neuropathy, unspecified: Secondary | ICD-10-CM

## 2019-07-09 DIAGNOSIS — Z794 Long term (current) use of insulin: Secondary | ICD-10-CM

## 2019-07-09 DIAGNOSIS — D689 Coagulation defect, unspecified: Secondary | ICD-10-CM

## 2019-07-09 MED ORDER — FUROSEMIDE 20 MG PO TABS: 20.0000 mg | ORAL_TABLET | Freq: Every evening | ORAL | 1 refills | Status: DC

## 2019-07-09 NOTE — Telephone Encounter (Signed)
New message   Patient needs a new prescription for furosemide (LASIX) 20 MG tablet sent to [facility], [place] [address]Marland Kitchen

## 2019-07-09 NOTE — Progress Notes (Signed)
Complaint:  Visit Type: Patient returns to my office for continued preventative foot care services. Complaint: Patient states" my nails have grown long and thick and become painful to walk and wear shoes" Patient has been diagnosed with DM with no foot complications. The patient presents for preventative foot care services. No changes to ROS.  Patient is taking plavix.  Podiatric Exam: Vascular: dorsalis pedis and posterior tibial pulses are palpable bilateral. Capillary return is immediate. Temperature gradient is WNL. Skin turgor WNL  Sensorium: Normal Semmes Weinstein monofilament test. Normal tactile sensation bilaterally. Nail Exam: Pt has thick disfigured discolored nails with subungual debris noted bilateral entire nail hallux through fifth toenails Ulcer Exam: There is no evidence of ulcer or pre-ulcerative changes or infection. Orthopedic Exam: Muscle tone and strength are WNL. No limitations in general ROM. No crepitus or effusions noted. Foot type and digits show no abnormalities. Bony prominences are unremarkable. Skin: No Porokeratosis. No infection or ulcers  Diagnosis:  Onychomycosis, , Pain in right toe, pain in left toes  Treatment & Plan Procedures and Treatment: Consent by patient was obtained for treatment procedures. The patient understood the discussion of treatment and procedures well. All questions were answered thoroughly reviewed. Debridement of mycotic and hypertrophic toenails, 1 through 5 bilateral and clearing of subungual debris. No ulceration, no infection noted.   Return Visit-Office Procedure: Patient instructed to return to the office for a follow up visit 3 months for continued evaluation and treatment.    [name] DPM

## 2019-07-09 NOTE — Telephone Encounter (Signed)
The furosemide 20 mg has been sent in per the patient request.

## 2019-07-16 DIAGNOSIS — E1122 Type 2 diabetes mellitus with diabetic chronic kidney disease: Secondary | ICD-10-CM | POA: Diagnosis not present

## 2019-07-16 DIAGNOSIS — N189 Chronic kidney disease, unspecified: Secondary | ICD-10-CM | POA: Diagnosis not present

## 2019-07-16 DIAGNOSIS — Z6841 Body Mass Index (BMI) 40.0 and over, adult: Secondary | ICD-10-CM | POA: Diagnosis not present

## 2019-07-16 DIAGNOSIS — I2510 Atherosclerotic heart disease of native coronary artery without angina pectoris: Secondary | ICD-10-CM | POA: Diagnosis not present

## 2019-07-16 DIAGNOSIS — N2581 Secondary hyperparathyroidism of renal origin: Secondary | ICD-10-CM | POA: Diagnosis not present

## 2019-07-16 DIAGNOSIS — I495 Sick sinus syndrome: Secondary | ICD-10-CM | POA: Diagnosis not present

## 2019-07-16 DIAGNOSIS — E785 Hyperlipidemia, unspecified: Secondary | ICD-10-CM | POA: Diagnosis not present

## 2019-07-16 DIAGNOSIS — I129 Hypertensive chronic kidney disease with stage 1 through stage 4 chronic kidney disease, or unspecified chronic kidney disease: Secondary | ICD-10-CM | POA: Diagnosis not present

## 2019-07-16 DIAGNOSIS — N184 Chronic kidney disease, stage 4 (severe): Secondary | ICD-10-CM | POA: Diagnosis not present

## 2019-07-21 ENCOUNTER — Encounter: Payer: Medicare Other | Admitting: Cardiovascular Disease

## 2019-08-25 ENCOUNTER — Other Ambulatory Visit: Payer: Self-pay

## 2019-08-25 ENCOUNTER — Ambulatory Visit (INDEPENDENT_AMBULATORY_CARE_PROVIDER_SITE_OTHER): Payer: Medicare Other | Admitting: Cardiovascular Disease

## 2019-08-25 ENCOUNTER — Encounter: Payer: Self-pay | Admitting: Cardiovascular Disease

## 2019-08-25 VITALS — BP 142/86 | HR 78 | Wt 264.4 lb

## 2019-08-25 DIAGNOSIS — I10 Essential (primary) hypertension: Secondary | ICD-10-CM | POA: Diagnosis not present

## 2019-08-25 DIAGNOSIS — N184 Chronic kidney disease, stage 4 (severe): Secondary | ICD-10-CM | POA: Diagnosis not present

## 2019-08-25 DIAGNOSIS — E782 Mixed hyperlipidemia: Secondary | ICD-10-CM | POA: Diagnosis not present

## 2019-08-25 DIAGNOSIS — E1122 Type 2 diabetes mellitus with diabetic chronic kidney disease: Secondary | ICD-10-CM | POA: Diagnosis not present

## 2019-08-25 DIAGNOSIS — Z950 Presence of cardiac pacemaker: Secondary | ICD-10-CM | POA: Diagnosis not present

## 2019-08-25 DIAGNOSIS — I2510 Atherosclerotic heart disease of native coronary artery without angina pectoris: Secondary | ICD-10-CM

## 2019-08-25 DIAGNOSIS — I495 Sick sinus syndrome: Secondary | ICD-10-CM

## 2019-08-25 DIAGNOSIS — Z794 Long term (current) use of insulin: Secondary | ICD-10-CM

## 2019-08-25 DIAGNOSIS — I5032 Chronic diastolic (congestive) heart failure: Secondary | ICD-10-CM | POA: Diagnosis not present

## 2019-08-25 DIAGNOSIS — I442 Atrioventricular block, complete: Secondary | ICD-10-CM | POA: Diagnosis not present

## 2019-08-25 DIAGNOSIS — G4733 Obstructive sleep apnea (adult) (pediatric): Secondary | ICD-10-CM | POA: Diagnosis not present

## 2019-08-25 DIAGNOSIS — I6522 Occlusion and stenosis of left carotid artery: Secondary | ICD-10-CM

## 2019-08-25 NOTE — Patient Instructions (Signed)
Medication Instructions:  No changes If you need a refill on your cardiac medications before your next appointment, please call your pharmacy.   Lab work: None ordered If you have labs (blood work) drawn today and your tests are completely normal, you will receive your results only by: [place] (if you have MyChart) OR A paper copy in the mail If you have any lab test that is abnormal or we need to change your treatment, we will call you to review the results.  Testing/Procedures: None ordered  Follow-Up: At [facility], you and your health needs are our priority.  As part of our continuing mission to provide you with exceptional heart care, we have created designated Provider Care Teams.  These Care Teams include your primary Cardiologist (physician) and Advanced Practice Providers (APPs -  Physician Assistants and Nurse Practitioners) who all work together to provide you with the care you need, when you need it.  We recommend signing up for the patient portal called "MyChart".  Sign up information is provided on this After Visit Summary.  MyChart is used to connect with patients for Virtual Visits (Telemedicine).  Patients are able to view lab/test results, encounter notes, upcoming appointments, etc.  Non-urgent messages can be sent to your provider as well.   To learn more about what you can do with MyChart, go to [email or web address].    Your next appointment:   6 month(s)  The format for your next appointment:   In Person  Provider:   Sanda Klein, MD   Remote monitoring is used to monitor your pacemaker from home. This monitoring reduces the number of office visits required to check your device to one time per year. It allows [place] to keep an eye on the functioning of your device to ensure it is working properly. You are scheduled for a device check from home on 09/30/19. You may send your transmission at any time that day. If you have a wireless device, the  transmission will be sent automatically.

## 2019-08-25 NOTE — Progress Notes (Signed)
Cardiology Office Note    Date:  08/27/2019   ID:  [name], DOB Aug 16, 1931, MRN [id number]  PCP:  [name], MD  Cardiologist:   Sanda Klein, MD   Chief Complaint  Patient presents with  . Pacemaker Check  . Shortness of Breath    History of Present Illness:  [name] is a 86 y.o. male with a dual-chamber permanent pacemaker, implanted for sinus bradycardia and intermittent second degree AV block (St. Jude Assurity, implanted in 1990, with generator changes in 2002, 2011 and 2020), CAD with remote stent procedures to LAD and RCA, and chronic diastolic heart failure. Additional problems include minor nonobstructive coronary atherosclerosis, insulin requiring type 2 diabetes mellitus, hyperlipidemia, hypertension in the setting of morbid obesity.  For many years he has had limited level of physical activity due to arthritis complaints.  He remains quite sedentary.  Despite this, he has now noticed more frequent problems with shortness of breath, even walking around the house while performing self-care activities.  He does not have orthopnea or PND.  He does not have leg edema.  He does not have angina either at rest or with activity and has not experienced palpitations, dizziness or syncope.  He has not had any focal neurological events and does not have intermittent claudication or lower extremity edema.  He expresses concern about progression of his kidney problems.  His nephrologist is Dr. Edrick Cross.  He has not given serious thought to dialysis.  Pacemaker interrogation shows normal device function. Estimated generator longevity is 9.2 years, lead parameters are all excellent (even though the original leads from [employer] are still in use). There is 64% atrial pacing and 98 % ventricular pacing, with increased ventricular pacing compared to past downloads.  There was no AV conduction or escape rhythm today when pacing was programmed VVI at 40 bpm suggesting that he is  now completely pacemaker dependent.  He has not had any atrial fibrillation or ventricular tachycardia.  In the past his device has shown occasional artifactual episodes of mode switch due to competitive sensor driven pacing and very brief episodes of true paroxysmal atrial tachycardia.  His echocardiogram in July 2020 showed LVEF of 50-55% with normal regional wall motion, but diastolic function could not be analyzed due to technical issues.  No major valvular abnormalities were seen but he did have moderate mitral annulus calcification.  He is on clopidogrel following a stroke in July 2020, from which he has recovered without sequelae. He has a 40-59% L ICA stenosis (60% by CTA).  He underwent staged LAD and RCA revascularization with drug-eluting stents in 2005 and has not required subsequent percutaneous revascularization procedures. His most recent nuclear study in January 2014 showed an EF of 34% with a fixed inferior defect and no reversible ischemia. I'm not sure about the accuracy of left ventricular ejection fraction assessment. His most recent echocardiogram performed around the same date showed a mildly depressed LVEF of 45-50% (note the presence of right ventricular pacing which may alter the accuracy of EF assessment).  Most recent echo from July 2020 shows EF of 50-55% despite ventricular pacing.  He has moderate to severe chronic kidney disease, likely secondary to hypertensive/diabetic nephropathy. 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  Past Medical  History:  Diagnosis Date  . Abnormal Doppler ultrasound of carotid artery 12/16/2012   mildly abnormal doppler . no prior studies to compare to. follow up studies are recommended when clinically indicated.  . Acute respiratory failure with  hypoxia ([place]) 11/2018  . Blepharitis   . Cancer ([place])   . Carotid stenosis, left 11/2018   on left 60%  . Diabetes mellitus without complication ([place])   . Exogenous obesity   . Gastric ulcer   . GI bleed   . History of stress test 06/06/2012   low risk scan. fixed inferior defect consistant with prior infarction with moderatly reduced EF. no appreciable evidence of ischemia. no significant change from previous study  . Hx of echocardiogram 08-15-2012   EF  45-50 % very difficult study, severe LVH, pacemaker, trace TR,mildly enlarged RV,; aorta mildly calcified  . Pacemaker 2011   St. Jude Accent DDDR ; this is the third generator this pt. has had  . Shingles   . TIA (transient ischemic attack) 11/2018   vs stroke not seen on CT  . UTI (lower urinary tract infection)     Past Surgical History:  Procedure Laterality Date  . CARDIAC CATHETERIZATION  12-29-2003   rt. coronary angiography was done with a 6 french 4 cm taper coronary cath. This demonstrated  approximately 70 to 75% mildly segmental stenosis in the mid RCA before the acute margin          . CARDIAC CATHETERIZATION  11-19-2003   diagnostic cath,, pt. to get second cath to have stent placed  . cardiac stents    . CATARACT EXTRACTION    . KNEE SURGERY Right   . PPM GENERATOR CHANGEOUT N/A 04/07/2019   Procedure: PPM GENERATOR CHANGEOUT;  Surgeon: Sanda Klein, MD;  Location: [facility];  Service: Cardiovascular;  Laterality: N/A;  . PROSTATE BIOPSY    . prostate seed implants    . SPINE SURGERY    . WISDOM TOOTH EXTRACTION      Current Medications: Outpatient Medications Prior to Visit  Medication Sig Dispense Refill  . acetaminophen (TYLENOL) 500 MG tablet Take 500 mg by mouth 2 (two) times daily.     Marland Kitchen aspirin 81 MG tablet Take 81 mg by mouth every evening.     . BD VEO INSULIN SYRINGE U/F 31G X 15/64" 1 ML MISC USE TO ADMINSTER INSULIN SYRINGE TWICE DAILY    . calcitRIOL (ROCALTROL) 0.25 MCG capsule Take  0.25 mcg by mouth every Monday, Wednesday, and Friday. evening    . Cholecalciferol (VITAMIN D) 50 MCG (2000 UT) tablet Take 2,000 Units by mouth every evening.    . clopidogrel (PLAVIX) 75 MG tablet Take 1 tablet by mouth once daily 90 tablet 3  . Coenzyme Q10 (COQ10) 100 MG CAPS Take 100 mg by mouth every other day. Alternate days with 200 mg    . Coenzyme Q10 (COQ10) 200 MG CAPS Take 200 mg by mouth every other day. Alternate days with 100 mg    . docusate sodium (COLACE) 100 MG capsule Take 100 mg by mouth 2 (two) times daily.     . Emollient (UDDERLY SMOOTH EX) Apply 1 application topically daily.    . famotidine (PEPCID) 40 MG tablet Take 40 mg by mouth at bedtime.    . furosemide (LASIX) 20 MG tablet Take 1 tablet (20 mg total) by mouth every evening. 30 tablet 1  . insulin NPH-regular Human (70-30) 100 UNIT/ML injection Inject 55-65 Units into  the skin 2 (two) times daily with a meal.     . irbesartan (AVAPRO) 150 MG tablet Take 1 tablet by mouth once daily 90 tablet 0  . levothyroxine (SYNTHROID, LEVOTHROID) 25 MCG tablet Take 25 mcg by mouth daily.     . metoprolol succinate (TOPROL-XL) 25 MG 24 hr tablet TAKE 1 & 1/2 (ONE & ONE-HALF) TABLETS BY MOUTH DAILY (Patient taking differently: Take 37.5 mg by mouth every evening. ) 135 tablet 2  . nitroGLYCERIN (NITROSTAT) 0.4 MG SL tablet Place 1 tablet (0.4 mg total) under the tongue every 5 (five) minutes as needed for chest pain. 25 tablet 1  . Omega-3 Fatty Acids (FISH OIL) 1200 MG CAPS Take 2,400 mg by mouth every evening.    Glory Rosebush ULTRA test strip USE TWICE DAILY TO CHECK GLUCOSE PRIOR TO INSULIN USE    . rosuvastatin (CRESTOR) 40 MG tablet Take 1 tablet by mouth once daily 90 tablet 3  . vitamin C (ASCORBIC ACID) 500 MG tablet Take 500 mg by mouth every evening.     . vitamin E 400 UNIT capsule Take 400 Units by mouth every evening.     . cephALEXin (KEFLEX) 500 MG capsule TAKE 1 CAPSULE BY MOUTH 4 TIMES DAILY FOR 10 DAYS    .  polyethylene glycol powder (GLYCOLAX/MIRALAX) 17 GM/SCOOP powder Take by mouth.    . potassium chloride (KLOR-CON) 10 MEQ tablet Take 2 tablets by mouth once daily (Patient taking differently: Take 20 mEq by mouth every evening. ) 180 tablet 0   Facility-Administered Medications Prior to Visit  Medication Dose Route Frequency Provider Last Rate Last Admin  . betamethasone acetate-betamethasone sodium phosphate (CELESTONE) injection 3 mg  3 mg Intramuscular Once William Kins, DPM         Allergies:   Patient has no known allergies.   Social History   Socioeconomic History  . Marital status: Married    Spouse name: Not on file  . Number of children: Not on file  . Years of education: Not on file  . Highest education level: Not on file  Occupational History  . Not on file  Tobacco Use  . Smoking status: Never Smoker  . Smokeless tobacco: Former Network engineer and Sexual Activity  . Alcohol use: Yes    Comment: daily wine  . Drug use: No  . Sexual activity: Not Currently    Partners: Female  Other Topics Concern  . Not on file  Social History Narrative   Right handed      Lives with wife, son and daughter in law      Highest level of edu- doctorate   Social Determinants of Health   Financial Resource Strain:   . Difficulty of Paying Living Expenses:   Food Insecurity:   . Worried About Charity fundraiser in the Last Year:   . Arboriculturist in the Last Year:   Transportation Needs:   . Film/video editor (Medical):   Marland Kitchen Lack of Transportation (Non-Medical):   Physical Activity:   . Days of Exercise per Week:   . Minutes of Exercise per Session:   Stress:   . Feeling of Stress :   Social Connections:   . Frequency of Communication with Friends and Family:   . Frequency of Social Gatherings with Friends and Family:   . Attends Religious Services:   . Active Member of Clubs or Organizations:   . Attends Archivist Meetings:   .  Marital Status:       ROS:   Please see the history of present illness.    ROS All systems reviewed and are negative  PHYSICAL EXAM:   VS:  BP (!) 142/86   Pulse 78   Wt 264 lb 6.4 oz (119.9 kg)   SpO2 96%   BMI 41.41 kg/m      General: Alert, oriented x3, no distress, morbidly obese.  Well-healed pacemaker site in the subclavian area. Head: no evidence of trauma, PERRL, EOMI, no exophtalmos or lid lag, no myxedema, no xanthelasma; normal ears, nose and oropharynx Neck: normal jugular venous pulsations and no hepatojugular reflux; brisk carotid pulses without delay and no carotid bruits Chest: clear to auscultation, no signs of consolidation by percussion or palpation, normal fremitus, symmetrical and full respiratory excursions Cardiovascular: normal position and quality of the apical impulse, regular rhythm, normal first and paradoxically split second heart sounds, no murmurs, rubs or gallops Abdomen: no tenderness or distention, no masses by palpation, no abnormal pulsatility or arterial bruits, normal bowel sounds, no hepatosplenomegaly Extremities: no clubbing, cyanosis or edema; 2+ radial, ulnar and brachial pulses bilaterally; 2+ right femoral, posterior tibial and dorsalis pedis pulses; 2+ left femoral, posterior tibial and dorsalis pedis pulses; no subclavian or femoral bruits Neurological: grossly nonfocal Psych: Normal mood and affect   Wt Readings from Last 3 Encounters:  08/25/19 264 lb 6.4 oz (119.9 kg)  05/26/19 260 lb (117.9 kg)  04/23/19 263 lb (119.3 kg)      Studies/Labs Reviewed:   EKG:  EKG is ordered today.  It shows atrial sensed (sinus), ventricular paced rhythm with a single PVC.  The QRS is very broad at 174 ms.  QTc 550 ms. Recent Labs: November 15, 2017 Dr. Buddy Duty Total cholesterol 208, LDL 96, HDL 39, triglycerides 36 Creatinine 2.07, hemoglobin A1c 8%, TSH 2.83, hemoglobin 16.1  06/17/2019 Total cholesterol 242, HDL 33, triglycerides 523 Hemoglobin A1c  7.3% 07/16/2019 Hemoglobin 16.2, creatinine 2.38  Lipid Panel    Component Value Date/Time   CHOL 156 11/24/2018 2337   TRIG 236 (H) 11/24/2018 2337   HDL 35 (L) 11/24/2018 2337   CHOLHDL 4.5 11/24/2018 2337   VLDL 47 (H) 11/24/2018 2337   LDLCALC 74 11/24/2018 2337   LDLDIRECT 138 (H) 04/01/2015 6295    ASSESSMENT:    1. Chronic diastolic heart failure ([place])   2. SSS (sick sinus syndrome) ([place])   3. CHB (complete heart block) ([facility])   4. Cardiac pacemaker in situ   5. Coronary artery disease involving native coronary artery of native heart without angina pectoris   6. Controlled type 2 diabetes mellitus with stage 4 chronic kidney disease, with long-term current use of insulin ([place])   7. Essential hypertension   8. Morbid obesity ([place])   9. Mixed hyperlipidemia   10. CKD (chronic kidney disease) stage 4, GFR 15-29 ml/min ([facility])   11. OSA (obstructive sleep apnea)   12. Stenosis of left internal carotid artery      PLAN:  In order of problems listed above:  1. CHF: As always, his body habitus makes it very hard to assess his volume status but his weight is minimally changed from previous trends and there are no overt signs of hypervolemia.  NYHA functional class IIIa.  It is possible there is been some decline in cardiac performance with what is now 100% ventricular pacing, but LVEF is around 50% on the very recent echocardiogram.  We discussed the conflicting volume status  interests from the point of view of his cardiac and renal status.  Important to avoid sodium rich foods and reports significant or rapid weight gains promptly.  I did not increase his diuretic today. 2. SSS: The heart rate histograms appear appropriate for his activity level.  I do not think chronotropic incompetence is contributing much to his dyspnea.   3. 3rd deg AVB: If LVEF declines he may benefit from CRT-P upgrade.  With current EF of 50% hard to recommend an invasive procedure, but his QRS is extremely  broad. 4. PPM: Recent generator change.  Normal device function.  Continue remote downloads every 3 months 5. CAD: He is on dual antiplatelet and highly active statin therapy as well as beta-blockers and a relatively low dose.  S/p DES stenting proximal LAD stenosis and DX2 POBA, June, 2005 and staged RCA DES, August, 2005. Free of angina. Last functional study was in 2014.  He does not have angina pectoris.  It is quite possible that his angina is a dyspnea equivalent, but any contrast based procedures will be associated with a very high risk of contrast nephrotoxicity and progression to ESRD.  He is definitely not a surgical candidate if his anatomy would recommend bypass surgery.  Benefit of PCI questionable in the absence of angina pectoris.  We will continue with medical management.  Encouraged him to think about whether or not he would accept hemodialysis. 6. DM: Followed by Dr. Buddy Duty, acceptable glycemic control for his age and comorbid conditions. 7. HTN: Borderline elevated systolic blood pressure today, but usually at home his blood pressures in the 120s.  In January his blood pressure was 112/62.  No changes made to his medication. 8. Morbid obesity: This puts him at a big disadvantage for both his metabolic and hemodynamic problems. 9. HLP: LDL seems to be very close to target, although hard to calculate with severe hypertriglyceridemia.  Especially with his comorbid conditions, adding an additional agent for hypertriglyceridemia appears to have more risk than benefit. 10. CKD stage 4: With significant worsening of renal function over the last year.  Likely primarily due to diabetic nephropathy.  Needs to discuss whether or not hemodialysis would be an acceptable treatment in the future, especially if we may have to perform angiography at some point.   11. OSA: Intolerant of CPAP.  This may also be contributing to his dyspnea. 12. L carotid stenosis/History of stroke: recheck carotid  US.    Medication Adjustments/Labs and Tests Ordered: Current medicines are reviewed at length with the patient today.  Concerns regarding medicines are outlined above.  Medication changes, Labs and Tests ordered today are listed in the Patient Instructions below. Patient Instructions  Medication Instructions:  No changes If you need a refill on your cardiac medications before your next appointment, please call your pharmacy.   Lab work: None ordered If you have labs (blood work) drawn today and your tests are completely normal, you will receive your results only by: [place] (if you have MyChart) OR A paper copy in the mail If you have any lab test that is abnormal or we need to change your treatment, we will call you to review the results.  Testing/Procedures: None ordered  Follow-Up: At [facility], you and your health needs are our priority.  As part of our continuing mission to provide you with exceptional heart care, we have created designated Provider Care Teams.  These Care Teams include your primary Cardiologist (physician) and Advanced Practice Providers (APPs -  Physician Assistants and Nurse Practitioners) who all work together to provide you with the care you need, when you need it.  We recommend signing up for the patient portal called "MyChart".  Sign up information is provided on this After Visit Summary.  MyChart is used to connect with patients for Virtual Visits (Telemedicine).  Patients are able to view lab/test results, encounter notes, upcoming appointments, etc.  Non-urgent messages can be sent to your provider as well.   To learn more about what you can do with MyChart, go to [email or web address].    Your next appointment:   6 month(s)  The format for your next appointment:   In Person  Provider:   Sanda Klein, MD   Remote monitoring is used to monitor your pacemaker from home. This monitoring reduces the number of office visits required to  check your device to one time per year. It allows [place] to keep an eye on the functioning of your device to ensure it is working properly. You are scheduled for a device check from home on 09/30/19. You may send your transmission at any time that day. If you have a wireless device, the transmission will be sent automatically.           Signed, Sanda Klein, MD  08/27/2019 9:31 AM    [facility] [address], [place], [place]  [address] Phone: [phone]; Fax: [phone]

## 2019-08-27 ENCOUNTER — Encounter: Payer: Self-pay | Admitting: Cardiovascular Disease

## 2019-08-27 DIAGNOSIS — I442 Atrioventricular block, complete: Secondary | ICD-10-CM

## 2019-08-27 HISTORY — DX: Atrioventricular block, complete: I44.2

## 2019-09-01 ENCOUNTER — Other Ambulatory Visit: Payer: Self-pay | Admitting: Cardiovascular Disease

## 2019-09-08 ENCOUNTER — Other Ambulatory Visit: Payer: Self-pay | Admitting: Cardiovascular Disease

## 2019-09-24 ENCOUNTER — Other Ambulatory Visit: Payer: Self-pay | Admitting: Cardiovascular Disease

## 2019-09-30 ENCOUNTER — Ambulatory Visit (INDEPENDENT_AMBULATORY_CARE_PROVIDER_SITE_OTHER): Payer: Medicare Other | Admitting: *Deleted

## 2019-09-30 DIAGNOSIS — I442 Atrioventricular block, complete: Secondary | ICD-10-CM | POA: Diagnosis not present

## 2019-10-01 ENCOUNTER — Telehealth: Payer: Self-pay

## 2019-10-01 LAB — CUP PACEART REMOTE DEVICE CHECK
Battery Remaining Longevity: 109 mo
Battery Remaining Percentage: 95.5 %
Battery Voltage: 3.01 V
Brady Statistic AP VP Percent: 71 %
Brady Statistic AP VS Percent: 1 %
Brady Statistic AS VP Percent: 23 %
Brady Statistic AS VS Percent: 1 %
Brady Statistic RA Percent Paced: 69 %
Brady Statistic RV Percent Paced: 95 %
Date Time Interrogation Session: 20210512102644
Implantable Lead Implant Date: 19901207
Implantable Lead Implant Date: 19901207
Implantable Lead Location: 753859
Implantable Lead Location: 753860
Implantable Pulse Generator Implant Date: 20201116
Lead Channel Impedance Value: 330 Ohm
Lead Channel Impedance Value: 560 Ohm
Lead Channel Pacing Threshold Amplitude: 0.875 V
Lead Channel Pacing Threshold Amplitude: 1.125 V
Lead Channel Pacing Threshold Pulse Width: 0.4 ms
Lead Channel Pacing Threshold Pulse Width: 0.4 ms
Lead Channel Sensing Intrinsic Amplitude: 12 mV
Lead Channel Sensing Intrinsic Amplitude: 4.6 mV
Lead Channel Setting Pacing Amplitude: 1.375
Lead Channel Setting Pacing Amplitude: 1.875
Lead Channel Setting Pacing Pulse Width: 0.4 ms
Lead Channel Setting Sensing Sensitivity: 3 mV
Pulse Gen Model: 2272
Pulse Gen Serial Number: 9177614

## 2019-10-01 NOTE — Telephone Encounter (Signed)
Spoke with patient to remind of missed remote transmission 

## 2019-10-02 NOTE — Progress Notes (Signed)
Remote pacemaker transmission.   

## 2019-10-05 ENCOUNTER — Other Ambulatory Visit: Payer: Self-pay | Admitting: Cardiovascular Disease

## 2019-10-06 DIAGNOSIS — N184 Chronic kidney disease, stage 4 (severe): Secondary | ICD-10-CM | POA: Diagnosis not present

## 2019-10-06 DIAGNOSIS — E1122 Type 2 diabetes mellitus with diabetic chronic kidney disease: Secondary | ICD-10-CM | POA: Diagnosis not present

## 2019-10-06 DIAGNOSIS — N189 Chronic kidney disease, unspecified: Secondary | ICD-10-CM | POA: Diagnosis not present

## 2019-10-06 DIAGNOSIS — E785 Hyperlipidemia, unspecified: Secondary | ICD-10-CM | POA: Diagnosis not present

## 2019-10-06 DIAGNOSIS — N2581 Secondary hyperparathyroidism of renal origin: Secondary | ICD-10-CM | POA: Diagnosis not present

## 2019-10-06 DIAGNOSIS — I129 Hypertensive chronic kidney disease with stage 1 through stage 4 chronic kidney disease, or unspecified chronic kidney disease: Secondary | ICD-10-CM | POA: Diagnosis not present

## 2019-10-06 DIAGNOSIS — Z6841 Body Mass Index (BMI) 40.0 and over, adult: Secondary | ICD-10-CM | POA: Diagnosis not present

## 2019-10-06 DIAGNOSIS — I2510 Atherosclerotic heart disease of native coronary artery without angina pectoris: Secondary | ICD-10-CM | POA: Diagnosis not present

## 2019-10-06 DIAGNOSIS — I495 Sick sinus syndrome: Secondary | ICD-10-CM | POA: Diagnosis not present

## 2019-10-10 ENCOUNTER — Ambulatory Visit (INDEPENDENT_AMBULATORY_CARE_PROVIDER_SITE_OTHER): Payer: Medicare Other | Admitting: Podiatry

## 2019-10-10 ENCOUNTER — Other Ambulatory Visit: Payer: Self-pay

## 2019-10-10 ENCOUNTER — Encounter: Payer: Self-pay | Admitting: Podiatry

## 2019-10-10 DIAGNOSIS — M79674 Pain in right toe(s): Secondary | ICD-10-CM | POA: Diagnosis not present

## 2019-10-10 DIAGNOSIS — B351 Tinea unguium: Secondary | ICD-10-CM

## 2019-10-10 DIAGNOSIS — Z794 Long term (current) use of insulin: Secondary | ICD-10-CM | POA: Diagnosis not present

## 2019-10-10 DIAGNOSIS — M79675 Pain in left toe(s): Secondary | ICD-10-CM

## 2019-10-10 DIAGNOSIS — D689 Coagulation defect, unspecified: Secondary | ICD-10-CM

## 2019-10-10 DIAGNOSIS — E1140 Type 2 diabetes mellitus with diabetic neuropathy, unspecified: Secondary | ICD-10-CM | POA: Diagnosis not present

## 2019-10-10 NOTE — Progress Notes (Signed)
This patient returns to my office for at risk foot care.  This patient requires this care by a professional since this patient will be at risk due to having diabetes and chronic kidney disease and coagulation defect.  Patient is taking plavix. This patient is unable to cut nails himself since the patient cannot reach his nails.These nails are painful walking and wearing shoes.  This patient presents for at risk foot care today.  General Appearance  Alert, conversant and in no acute stress.  Vascular  Dorsalis pedis and posterior tibial  pulses are palpable  bilaterally.  Capillary return is within normal limits  bilaterally. Temperature is within normal limits  bilaterally.  Neurologic  Senn-Weinstein monofilament wire test within normal limits  bilaterally. Muscle power within normal limits bilaterally.  Nails Thick disfigured discolored nails with subungual debris  from hallux to fifth toes bilaterally. No evidence of bacterial infection or drainage bilaterally.  Orthopedic  No limitations of motion  feet .  No crepitus or effusions noted.  No bony pathology or digital deformities noted.  Skin  normotropic skin with no porokeratosis noted bilaterally.  No signs of infections or ulcers noted.     Onychomycosis  Pain in right toes  Pain in left toes  Consent was obtained for treatment procedures.   Mechanical debridement of nails 1-5  bilaterally performed with a nail nipper.  Filed with dremel without incident.    Return office visit   3 months                  Told patient to return for periodic foot care and evaluation due to potential at risk complications.   [name] DPM

## 2019-10-18 DIAGNOSIS — M26609 Unspecified temporomandibular joint disorder, unspecified side: Secondary | ICD-10-CM | POA: Diagnosis not present

## 2019-11-24 ENCOUNTER — Other Ambulatory Visit: Payer: Self-pay | Admitting: Cardiovascular Disease

## 2019-11-26 ENCOUNTER — Other Ambulatory Visit: Payer: Self-pay

## 2019-11-26 ENCOUNTER — Ambulatory Visit (HOSPITAL_COMMUNITY)
Admission: RE | Admit: 2019-11-26 | Discharge: 2019-11-26 | Disposition: A | Discharge status: Home / Self Care | Payer: Medicare Other | Source: Ambulatory Visit | Attending: Cardiology | Admitting: Cardiology

## 2019-11-26 DIAGNOSIS — G459 Transient cerebral ischemic attack, unspecified: Secondary | ICD-10-CM | POA: Insufficient documentation

## 2019-12-16 DIAGNOSIS — Z794 Long term (current) use of insulin: Secondary | ICD-10-CM | POA: Diagnosis not present

## 2019-12-16 DIAGNOSIS — N1831 Chronic kidney disease, stage 3a: Secondary | ICD-10-CM | POA: Diagnosis not present

## 2019-12-16 DIAGNOSIS — E1142 Type 2 diabetes mellitus with diabetic polyneuropathy: Secondary | ICD-10-CM | POA: Diagnosis not present

## 2019-12-16 DIAGNOSIS — E039 Hypothyroidism, unspecified: Secondary | ICD-10-CM | POA: Diagnosis not present

## 2019-12-16 DIAGNOSIS — E1165 Type 2 diabetes mellitus with hyperglycemia: Secondary | ICD-10-CM | POA: Diagnosis not present

## 2019-12-17 IMAGING — CT CT HEAD WITHOUT CONTRAST
4 series · 16 of 47 positions shown, 18 images · non-contrast
Comparison: CT head and neck 11/24/2018

CLINICAL DATA: Stroke follow-up. Episode of transient word-finding
difficulties and confusion

EXAM:
CT HEAD WITHOUT CONTRAST
TECHNIQUE: Contiguous axial images were obtained from the base of the skull
through the vertex without intravenous contrast.

[Series 3: head without · axial · non-contrast · 0.44mm/px · z∈[-128,-8]mm · 7 of 34 slices shown, 9 images]
[im 5/34  brain]
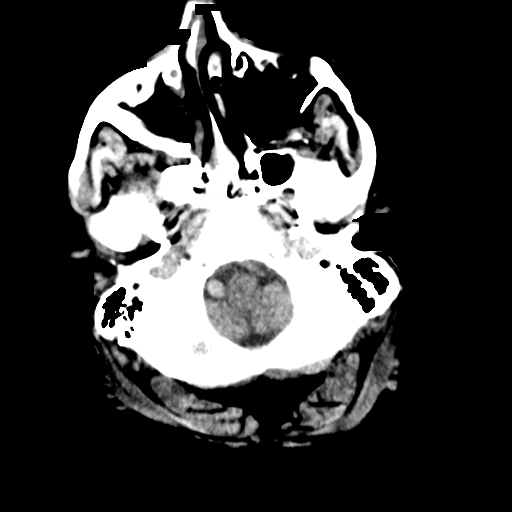
[im 5/34  bone]
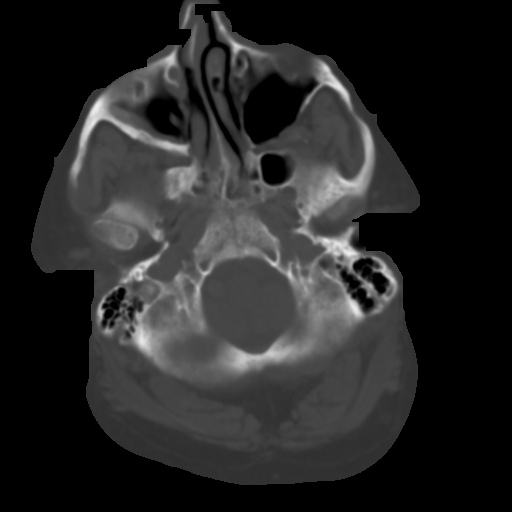
[im 9/34  brain]
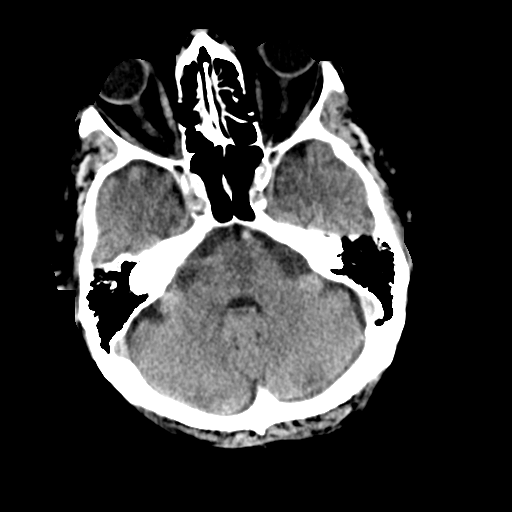
[im 13/34  brain]
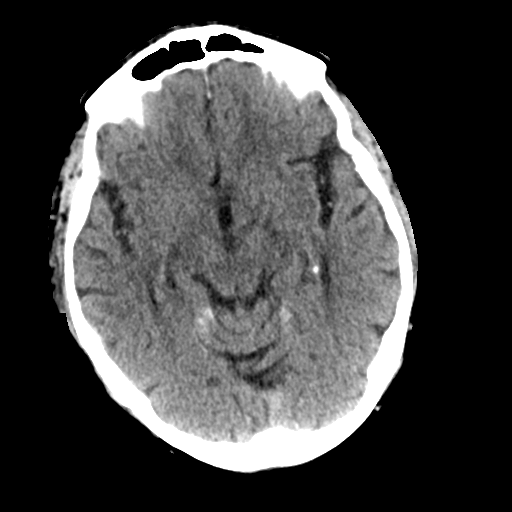
[im 17/34  brain]
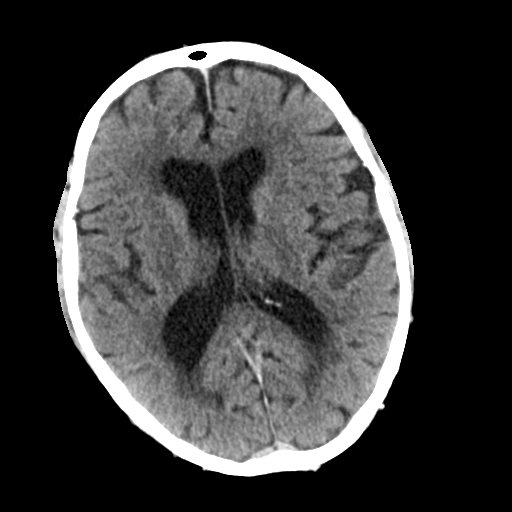
[im 21/34  brain]
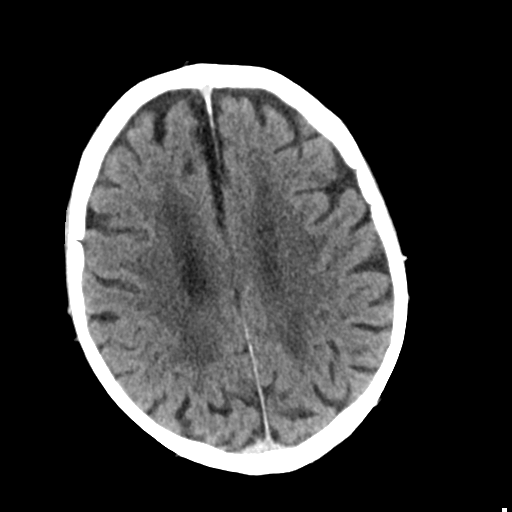
[im 21/34  bone]
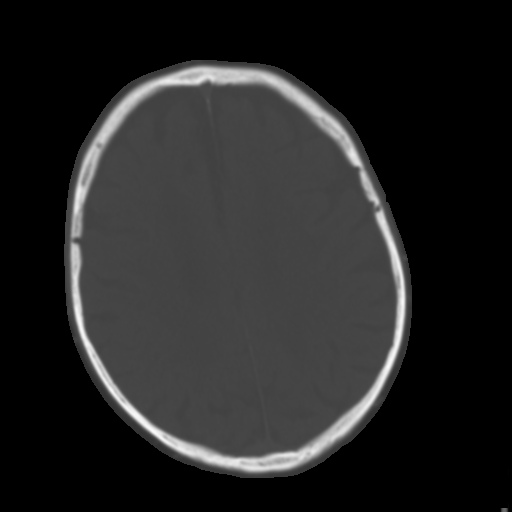
[im 25/34  brain]
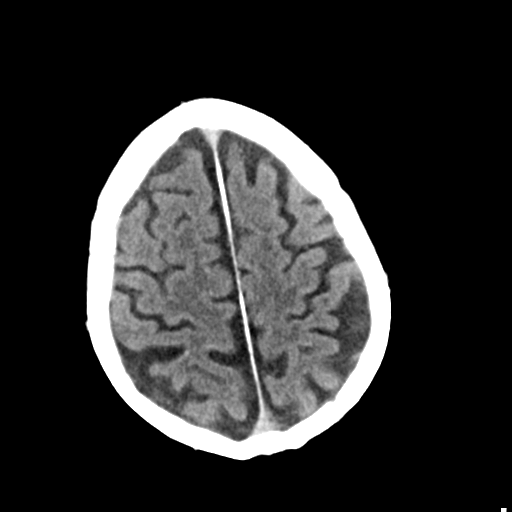
[im 29/34  brain]
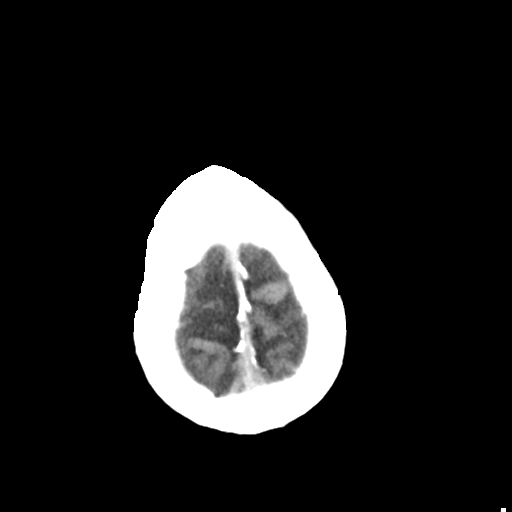

[Series 4: head bone · axial · 0.44mm/px · z∈[-132,-100]mm · 3 of 84 slices shown]
[im 9/84  bone]
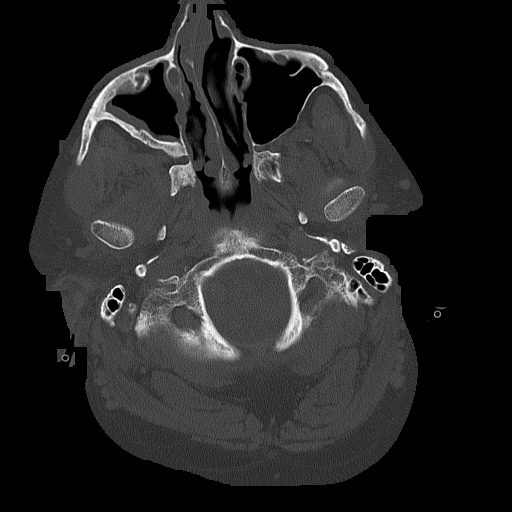
[im 17/84  bone]
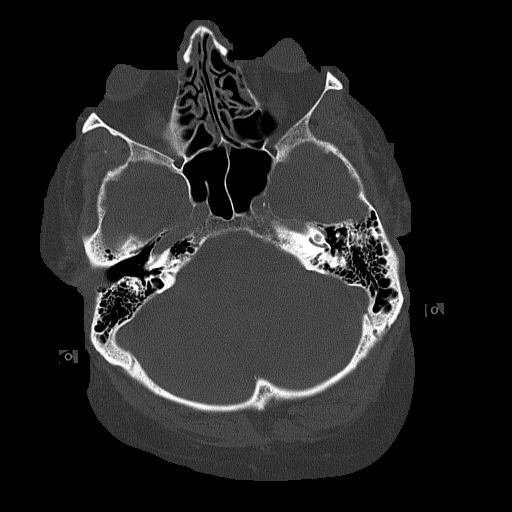
[im 25/84  bone]
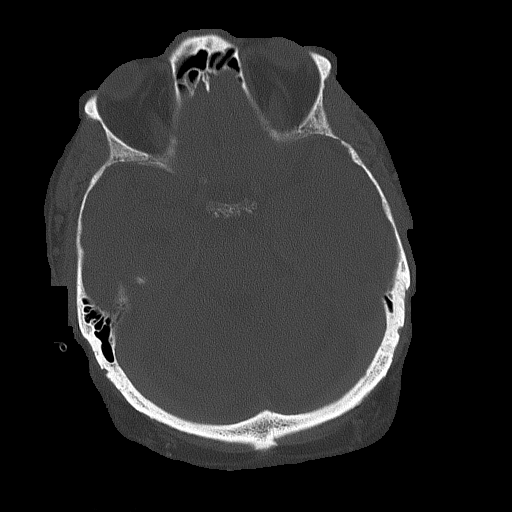

[Series 5: head without cor · coronal · non-contrast · 0.33mm/px · 3 of 74 slices shown]
[im 25/74  brain]
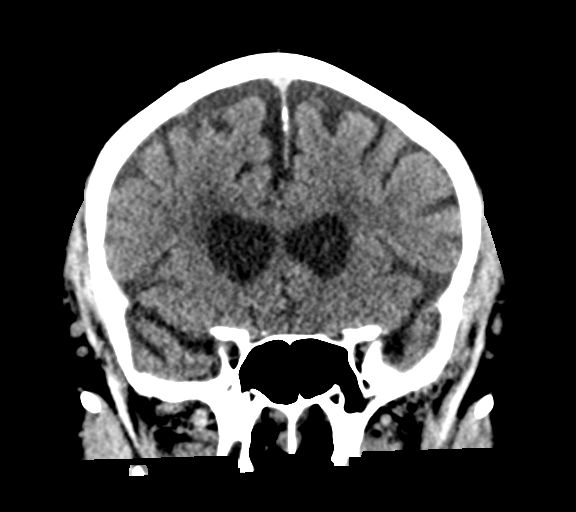
[im 33/74  brain]
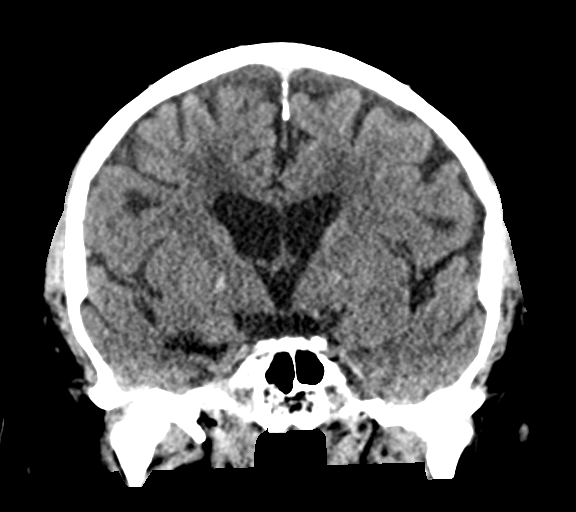
[im 41/74  brain]
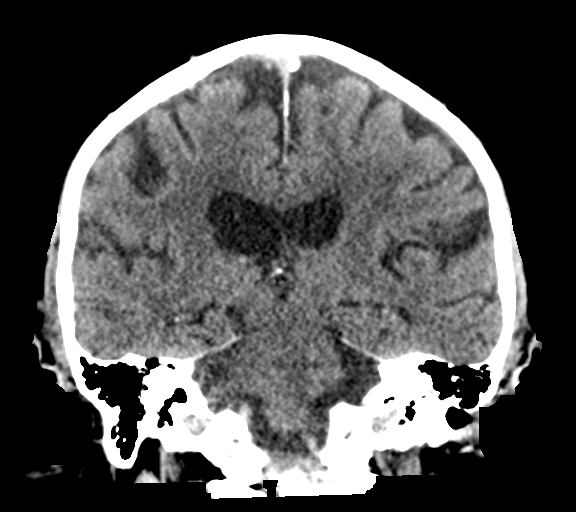

[Series 6: head without sag · sagittal · non-contrast · 0.35mm/px · 3 of 67 slices shown]
[im 23/67  brain]
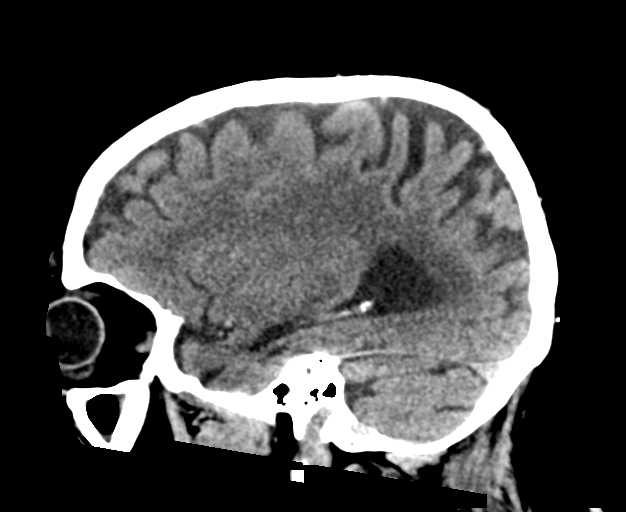
[im 34/67  brain]
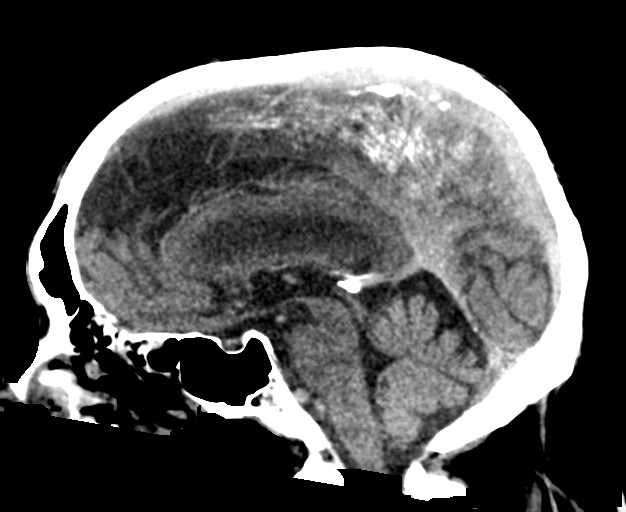
[im 45/67  brain]
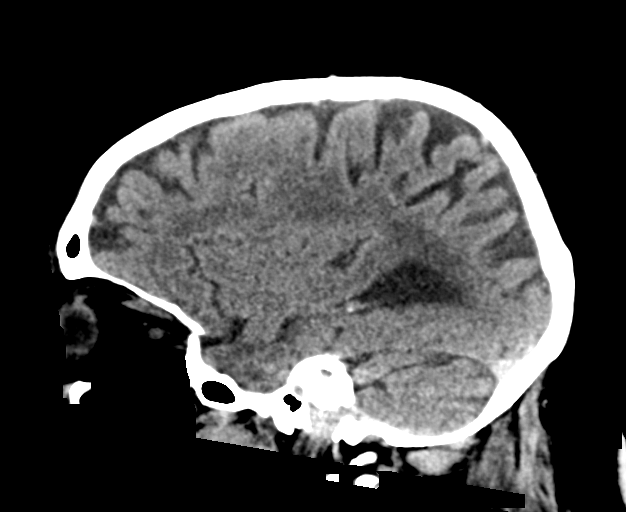

[16 of 47 positions shown; findings below may reference images not displayed]

FINDINGS: Brain: Mild atrophy and white matter changes are stable. No acute
cortical infarct, hemorrhage, or mass lesion is present. Basal
ganglia are intact. Insular ribbon is normal. The ventricles are
proportionate to the degree of atrophy. No significant extraaxial
fluid collection is present. The brainstem and cerebellum are within
normal limits.

Vascular: Atherosclerotic calcifications are present within the
cavernous internal carotid arteries bilaterally. There is no
hyperdense vessel.

Skull: Calvarium is intact. No focal lytic or blastic lesions are
present.

Sinuses/Orbits: Chronic right maxillary sinus disease is again
noted. There is chronic wall thickening. Mild diffuse mucosal
thickening is present throughout the ethmoid air cells and inferior
frontal sinuses bilaterally. Sphenoid sinuses and mastoid air cells
are clear. Bilateral lens replacements are noted. Globes and orbits
are otherwise unremarkable.
IMPRESSION: 1. No acute intracranial abnormality or significant interval change.
2. Stable mild atrophy and white matter disease.
3. Chronic right maxillary sinus disease.

## 2019-12-30 ENCOUNTER — Ambulatory Visit (INDEPENDENT_AMBULATORY_CARE_PROVIDER_SITE_OTHER): Payer: Medicare Other | Admitting: *Deleted

## 2019-12-30 DIAGNOSIS — I441 Atrioventricular block, second degree: Secondary | ICD-10-CM | POA: Diagnosis not present

## 2019-12-31 LAB — CUP PACEART REMOTE DEVICE CHECK
Battery Remaining Longevity: 108 mo
Battery Remaining Percentage: 95.5 %
Battery Voltage: 3.01 V
Brady Statistic AP VP Percent: 63 %
Brady Statistic AP VS Percent: 1 %
Brady Statistic AS VP Percent: 34 %
Brady Statistic AS VS Percent: 1 %
Brady Statistic RA Percent Paced: 61 %
Brady Statistic RV Percent Paced: 97 %
Date Time Interrogation Session: 20210810040015
Implantable Lead Implant Date: 19901207
Implantable Lead Implant Date: 19901207
Implantable Lead Location: 753859
Implantable Lead Location: 753860
Implantable Pulse Generator Implant Date: 20201116
Lead Channel Impedance Value: 310 Ohm
Lead Channel Impedance Value: 550 Ohm
Lead Channel Pacing Threshold Amplitude: 0.875 V
Lead Channel Pacing Threshold Amplitude: 1.25 V
Lead Channel Pacing Threshold Pulse Width: 0.4 ms
Lead Channel Pacing Threshold Pulse Width: 0.4 ms
Lead Channel Sensing Intrinsic Amplitude: 12 mV
Lead Channel Sensing Intrinsic Amplitude: 4.5 mV
Lead Channel Setting Pacing Amplitude: 1.5 V
Lead Channel Setting Pacing Amplitude: 1.875
Lead Channel Setting Pacing Pulse Width: 0.4 ms
Lead Channel Setting Sensing Sensitivity: 3 mV
Pulse Gen Model: 2272
Pulse Gen Serial Number: 9177614

## 2020-01-01 NOTE — Progress Notes (Signed)
Remote pacemaker transmission.   

## 2020-01-09 ENCOUNTER — Ambulatory Visit (INDEPENDENT_AMBULATORY_CARE_PROVIDER_SITE_OTHER): Payer: Medicare Other | Admitting: Podiatry

## 2020-01-09 ENCOUNTER — Encounter: Payer: Self-pay | Admitting: Podiatry

## 2020-01-09 ENCOUNTER — Other Ambulatory Visit: Payer: Self-pay

## 2020-01-09 DIAGNOSIS — D689 Coagulation defect, unspecified: Secondary | ICD-10-CM

## 2020-01-09 DIAGNOSIS — E1140 Type 2 diabetes mellitus with diabetic neuropathy, unspecified: Secondary | ICD-10-CM

## 2020-01-09 DIAGNOSIS — M79675 Pain in left toe(s): Secondary | ICD-10-CM

## 2020-01-09 DIAGNOSIS — Z794 Long term (current) use of insulin: Secondary | ICD-10-CM | POA: Diagnosis not present

## 2020-01-09 DIAGNOSIS — M79674 Pain in right toe(s): Secondary | ICD-10-CM | POA: Diagnosis not present

## 2020-01-09 DIAGNOSIS — B351 Tinea unguium: Secondary | ICD-10-CM

## 2020-01-09 NOTE — Progress Notes (Signed)
This patient returns to my office for at risk foot care.  This patient requires this care by a professional since this patient will be at risk due to having diabetes and chronic kidney disease and coagulation defect.  Patient is taking plavix.  This patient is unable to cut nails himself since the patient cannot reach his nails.These nails are painful walking and wearing shoes.  This patient presents for at risk foot care today.  General Appearance  Alert, conversant and in no acute stress.  Vascular  Dorsalis pedis and posterior tibial  pulses are palpable  bilaterally.  Capillary return is within normal limits  bilaterally. Temperature is within normal limits  bilaterally.  Neurologic  Senn-Weinstein monofilament wire test within normal limits  bilaterally. Muscle power within normal limits bilaterally.  Nails Thick disfigured discolored nails with subungual debris  from hallux to fifth toes bilaterally. No evidence of bacterial infection or drainage bilaterally.  Orthopedic  No limitations of motion  feet .  No crepitus or effusions noted.  No bony pathology or digital deformities noted.  Skin  normotropic skin with no porokeratosis noted bilaterally.  No signs of infections or ulcers noted.     Onychomycosis  Pain in right toes  Pain in left toes  Consent was obtained for treatment procedures.   Mechanical debridement of nails 1-5  bilaterally performed with a nail nipper.  Filed with dremel without incident.    Return office visit   3 months                  Told patient to return for periodic foot care and evaluation due to potential at risk complications.   [name] DPM

## 2020-01-12 DIAGNOSIS — I129 Hypertensive chronic kidney disease with stage 1 through stage 4 chronic kidney disease, or unspecified chronic kidney disease: Secondary | ICD-10-CM | POA: Diagnosis not present

## 2020-01-12 DIAGNOSIS — N184 Chronic kidney disease, stage 4 (severe): Secondary | ICD-10-CM | POA: Diagnosis not present

## 2020-01-12 DIAGNOSIS — N2581 Secondary hyperparathyroidism of renal origin: Secondary | ICD-10-CM | POA: Diagnosis not present

## 2020-01-12 DIAGNOSIS — Z6841 Body Mass Index (BMI) 40.0 and over, adult: Secondary | ICD-10-CM | POA: Diagnosis not present

## 2020-01-12 DIAGNOSIS — N189 Chronic kidney disease, unspecified: Secondary | ICD-10-CM | POA: Diagnosis not present

## 2020-01-12 DIAGNOSIS — E785 Hyperlipidemia, unspecified: Secondary | ICD-10-CM | POA: Diagnosis not present

## 2020-01-12 DIAGNOSIS — I495 Sick sinus syndrome: Secondary | ICD-10-CM | POA: Diagnosis not present

## 2020-01-12 DIAGNOSIS — E1122 Type 2 diabetes mellitus with diabetic chronic kidney disease: Secondary | ICD-10-CM | POA: Diagnosis not present

## 2020-01-12 DIAGNOSIS — I2510 Atherosclerotic heart disease of native coronary artery without angina pectoris: Secondary | ICD-10-CM | POA: Diagnosis not present

## 2020-01-15 ENCOUNTER — Other Ambulatory Visit: Payer: Self-pay | Admitting: Nephrology

## 2020-01-15 DIAGNOSIS — D631 Anemia in chronic kidney disease: Secondary | ICD-10-CM

## 2020-01-15 DIAGNOSIS — E1122 Type 2 diabetes mellitus with diabetic chronic kidney disease: Secondary | ICD-10-CM

## 2020-01-15 DIAGNOSIS — N189 Chronic kidney disease, unspecified: Secondary | ICD-10-CM

## 2020-01-15 DIAGNOSIS — N2581 Secondary hyperparathyroidism of renal origin: Secondary | ICD-10-CM

## 2020-01-20 ENCOUNTER — Ambulatory Visit
Admission: RE | Admit: 2020-01-20 | Discharge: 2020-01-20 | Disposition: A | Discharge status: Home / Self Care | Payer: Medicare Other | Source: Ambulatory Visit | Attending: Nephrology | Admitting: Nephrology

## 2020-01-20 DIAGNOSIS — N2581 Secondary hyperparathyroidism of renal origin: Secondary | ICD-10-CM

## 2020-01-20 DIAGNOSIS — E1122 Type 2 diabetes mellitus with diabetic chronic kidney disease: Secondary | ICD-10-CM

## 2020-01-20 DIAGNOSIS — N184 Chronic kidney disease, stage 4 (severe): Secondary | ICD-10-CM | POA: Diagnosis not present

## 2020-01-20 DIAGNOSIS — D631 Anemia in chronic kidney disease: Secondary | ICD-10-CM

## 2020-01-20 DIAGNOSIS — N189 Chronic kidney disease, unspecified: Secondary | ICD-10-CM

## 2020-01-28 DIAGNOSIS — E875 Hyperkalemia: Secondary | ICD-10-CM | POA: Diagnosis not present

## 2020-02-03 DIAGNOSIS — Z23 Encounter for immunization: Secondary | ICD-10-CM | POA: Diagnosis not present

## 2020-03-02 ENCOUNTER — Other Ambulatory Visit: Payer: Self-pay | Admitting: Cardiovascular Disease

## 2020-03-03 NOTE — Telephone Encounter (Signed)
Rx request sent to pharmacy.  

## 2020-03-06 ENCOUNTER — Ambulatory Visit: Payer: Medicare Other | Attending: Internal Medicine

## 2020-03-06 DIAGNOSIS — Z23 Encounter for immunization: Secondary | ICD-10-CM

## 2020-03-06 NOTE — Progress Notes (Signed)
   Covid-19 Vaccination Clinic  Name:  [name]    MRN: [id number] DOB: 1931-09-19  03/06/2020  [name] was observed post Covid-19 immunization for 15 minutes without incident. He was provided with Vaccine Information Sheet and instruction to access the V-Safe system.   [name] was instructed to call 911 with any severe reactions post vaccine: Marland Kitchen Difficulty breathing  . Swelling of face and throat  . A fast heartbeat  . A bad rash all over body  . Dizziness and weakness

## 2020-03-30 ENCOUNTER — Ambulatory Visit (INDEPENDENT_AMBULATORY_CARE_PROVIDER_SITE_OTHER): Payer: Medicare Other

## 2020-03-30 DIAGNOSIS — I441 Atrioventricular block, second degree: Secondary | ICD-10-CM

## 2020-03-30 LAB — CUP PACEART REMOTE DEVICE CHECK
Battery Remaining Longevity: 106 mo
Battery Remaining Percentage: 95.5 %
Battery Voltage: 3.01 V
Brady Statistic AP VP Percent: 61 %
Brady Statistic AP VS Percent: 1 %
Brady Statistic AS VP Percent: 37 %
Brady Statistic AS VS Percent: 1 %
Brady Statistic RA Percent Paced: 61 %
Brady Statistic RV Percent Paced: 98 %
Date Time Interrogation Session: 20211109030014
Implantable Lead Implant Date: 19901207
Implantable Lead Implant Date: 19901207
Implantable Lead Location: 753859
Implantable Lead Location: 753860
Implantable Pulse Generator Implant Date: 20201116
Lead Channel Impedance Value: 310 Ohm
Lead Channel Impedance Value: 550 Ohm
Lead Channel Pacing Threshold Amplitude: 0.75 V
Lead Channel Pacing Threshold Amplitude: 1.25 V
Lead Channel Pacing Threshold Pulse Width: 0.4 ms
Lead Channel Pacing Threshold Pulse Width: 0.4 ms
Lead Channel Sensing Intrinsic Amplitude: 12 mV
Lead Channel Sensing Intrinsic Amplitude: 4.8 mV
Lead Channel Setting Pacing Amplitude: 1.5 V
Lead Channel Setting Pacing Amplitude: 1.75 V
Lead Channel Setting Pacing Pulse Width: 0.4 ms
Lead Channel Setting Sensing Sensitivity: 3 mV
Pulse Gen Model: 2272
Pulse Gen Serial Number: 9177614

## 2020-03-31 DIAGNOSIS — Z6841 Body Mass Index (BMI) 40.0 and over, adult: Secondary | ICD-10-CM | POA: Diagnosis not present

## 2020-03-31 DIAGNOSIS — N189 Chronic kidney disease, unspecified: Secondary | ICD-10-CM | POA: Diagnosis not present

## 2020-03-31 DIAGNOSIS — I129 Hypertensive chronic kidney disease with stage 1 through stage 4 chronic kidney disease, or unspecified chronic kidney disease: Secondary | ICD-10-CM | POA: Diagnosis not present

## 2020-03-31 DIAGNOSIS — N184 Chronic kidney disease, stage 4 (severe): Secondary | ICD-10-CM | POA: Diagnosis not present

## 2020-03-31 DIAGNOSIS — E1122 Type 2 diabetes mellitus with diabetic chronic kidney disease: Secondary | ICD-10-CM | POA: Diagnosis not present

## 2020-03-31 DIAGNOSIS — N2581 Secondary hyperparathyroidism of renal origin: Secondary | ICD-10-CM | POA: Diagnosis not present

## 2020-03-31 DIAGNOSIS — E785 Hyperlipidemia, unspecified: Secondary | ICD-10-CM | POA: Diagnosis not present

## 2020-03-31 DIAGNOSIS — I495 Sick sinus syndrome: Secondary | ICD-10-CM | POA: Diagnosis not present

## 2020-03-31 DIAGNOSIS — I2510 Atherosclerotic heart disease of native coronary artery without angina pectoris: Secondary | ICD-10-CM | POA: Diagnosis not present

## 2020-03-31 NOTE — Progress Notes (Signed)
Remote pacemaker transmission.   

## 2020-04-14 ENCOUNTER — Ambulatory Visit (INDEPENDENT_AMBULATORY_CARE_PROVIDER_SITE_OTHER): Payer: Medicare Other | Admitting: Podiatry

## 2020-04-14 ENCOUNTER — Encounter: Payer: Self-pay | Admitting: Podiatry

## 2020-04-14 ENCOUNTER — Other Ambulatory Visit: Payer: Self-pay

## 2020-04-14 DIAGNOSIS — E1140 Type 2 diabetes mellitus with diabetic neuropathy, unspecified: Secondary | ICD-10-CM

## 2020-04-14 DIAGNOSIS — M79674 Pain in right toe(s): Secondary | ICD-10-CM

## 2020-04-14 DIAGNOSIS — M79675 Pain in left toe(s): Secondary | ICD-10-CM

## 2020-04-14 DIAGNOSIS — B351 Tinea unguium: Secondary | ICD-10-CM

## 2020-04-14 DIAGNOSIS — D689 Coagulation defect, unspecified: Secondary | ICD-10-CM

## 2020-04-14 DIAGNOSIS — Z794 Long term (current) use of insulin: Secondary | ICD-10-CM

## 2020-04-14 NOTE — Progress Notes (Signed)
This patient returns to my office for at risk foot care.  This patient requires this care by a professional since this patient will be at risk due to having diabetes and chronic kidney disease and coagulation defect.  Patient is taking plavix.  This patient is unable to cut nails himself since the patient cannot reach his nails.These nails are painful walking and wearing shoes.  This patient presents for at risk foot care today.  General Appearance  Alert, conversant and in no acute stress.  Vascular  Dorsalis pedis and posterior tibial  pulses are palpable  bilaterally.  Capillary return is within normal limits  bilaterally. Temperature is within normal limits  bilaterally.  Neurologic  Senn-Weinstein monofilament wire test within normal limits  bilaterally. Muscle power within normal limits bilaterally.  Nails Thick disfigured discolored nails with subungual debris  from hallux to fifth toes bilaterally. No evidence of bacterial infection or drainage bilaterally.  Orthopedic  No limitations of motion  feet .  No crepitus or effusions noted.  No bony pathology or digital deformities noted.  Skin  normotropic skin with no porokeratosis noted bilaterally.  No signs of infections or ulcers noted.     Onychomycosis  Pain in right toes  Pain in left toes  Consent was obtained for treatment procedures.   Mechanical debridement of nails 1-5  bilaterally performed with a nail nipper.  Filed with dremel without incident.    Return office visit   3 months                  Told patient to return for periodic foot care and evaluation due to potential at risk complications.   [name] DPM

## 2020-04-19 ENCOUNTER — Encounter: Payer: Self-pay | Admitting: Cardiovascular Disease

## 2020-04-19 ENCOUNTER — Ambulatory Visit (INDEPENDENT_AMBULATORY_CARE_PROVIDER_SITE_OTHER): Payer: Medicare Other | Admitting: Cardiovascular Disease

## 2020-04-19 ENCOUNTER — Other Ambulatory Visit: Payer: Self-pay

## 2020-04-19 VITALS — BP 162/76 | HR 94 | Ht 69.69 in | Wt 262.8 lb

## 2020-04-19 DIAGNOSIS — I6529 Occlusion and stenosis of unspecified carotid artery: Secondary | ICD-10-CM

## 2020-04-19 DIAGNOSIS — I442 Atrioventricular block, complete: Secondary | ICD-10-CM

## 2020-04-19 DIAGNOSIS — I10 Essential (primary) hypertension: Secondary | ICD-10-CM | POA: Diagnosis not present

## 2020-04-19 DIAGNOSIS — G4733 Obstructive sleep apnea (adult) (pediatric): Secondary | ICD-10-CM | POA: Diagnosis not present

## 2020-04-19 DIAGNOSIS — E1122 Type 2 diabetes mellitus with diabetic chronic kidney disease: Secondary | ICD-10-CM

## 2020-04-19 DIAGNOSIS — Z950 Presence of cardiac pacemaker: Secondary | ICD-10-CM

## 2020-04-19 DIAGNOSIS — E782 Mixed hyperlipidemia: Secondary | ICD-10-CM

## 2020-04-19 DIAGNOSIS — N184 Chronic kidney disease, stage 4 (severe): Secondary | ICD-10-CM

## 2020-04-19 DIAGNOSIS — I2510 Atherosclerotic heart disease of native coronary artery without angina pectoris: Secondary | ICD-10-CM

## 2020-04-19 DIAGNOSIS — I495 Sick sinus syndrome: Secondary | ICD-10-CM | POA: Diagnosis not present

## 2020-04-19 DIAGNOSIS — I5032 Chronic diastolic (congestive) heart failure: Secondary | ICD-10-CM | POA: Diagnosis not present

## 2020-04-19 DIAGNOSIS — Z794 Long term (current) use of insulin: Secondary | ICD-10-CM

## 2020-04-19 NOTE — Patient Instructions (Signed)

## 2020-04-19 NOTE — Progress Notes (Signed)
Cardiology Office Note    Date:  04/19/2020   ID:  [name], DOB [birth year], MRN [id number]  PCP:  [name], MD  Cardiologist:   Sanda Klein, MD   No chief complaint on file.   History of Present Illness:  [name] is a 86 y.o. male with a dual-chamber permanent pacemaker, implanted for sinus bradycardia and intermittent second degree AV block (St. Jude Assurity, implanted in 1990, with generator changes in 2002, 2011 and 2020), CAD with remote stent procedures to LAD and RCA, and chronic diastolic heart failure. Additional problems include minor nonobstructive coronary atherosclerosis, insulin requiring type 2 diabetes mellitus, hyperlipidemia, hypertension in the setting of morbid obesity, CKD stage IV (Dr. Justin Mend).  Activities primarily limited by severe low back pain and neuropathy in his lower extremities.  He has to stop and rest after every 100 feet.  He can climb a flight of stairs but has to stop at the top of the landing.  He also becomes short of breath when his back "spasms".  Denies other focal neurological complaints, falls, bleeding, serious injuries.  He does not have orthopnea or PND.  Intermittent issues with lower extremity edema, resolves with elevation of legs.  Pacemaker function is normal.  His current generator implanted in 2020 has 9.4 years of estimated longevity (still using the original leads from [employer]).  He has 62% atrial pacing and 98% ventricular pacing.  He has had rare episodes of paroxysmal atrial tachycardia for maximum of 12 seconds duration, no true atrial fibrillation or ventricular tachycardia.  His paced QRS is very broad at about 160-180 ms.  His echocardiogram in July 2020 showed LVEF of 50-55% with normal regional wall motion, but diastolic function could not be analyzed due to technical issues.  No major valvular abnormalities were seen but he did have moderate mitral annulus calcification.  He is on clopidogrel following a  stroke in July 2020, from which he has recovered without sequelae. He has a 40-59% L ICA stenosis (60% by CTA).  He underwent staged LAD and RCA revascularization with drug-eluting stents in 2005 and has not required subsequent percutaneous revascularization procedures. His most recent nuclear study in January 2014 showed an EF of 34% with a fixed inferior defect and no reversible ischemia. I'm not sure about the accuracy of left ventricular ejection fraction assessment. His most recent echocardiogram performed around the same date showed a mildly depressed LVEF of 45-50% (note the presence of right ventricular pacing which may alter the accuracy of EF assessment).  Most recent echo from July 2020 shows EF of 50-55% despite ventricular pacing.  He has moderate to severe chronic kidney disease, likely secondary to hypertensive/diabetic nephropathy. 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 He is now retired and is an avid Geophysicist/field seismologist : He mostly likes to photograph buildings and has an exhibit coming up at [employer].  Past Medical History:  Diagnosis Date  . Abnormal Doppler ultrasound of carotid artery 12/16/2012   mildly abnormal doppler . no prior studies to compare to. follow up studies are recommended when clinically indicated.  . Acute respiratory failure with hypoxia ([place]) 11/2018  . Blepharitis   . Cancer ([place])   . Carotid stenosis, left 11/2018   on left 60%  . Diabetes mellitus without complication ([place])   . Exogenous obesity   . Gastric ulcer   . GI bleed   . History of  stress test 06/06/2012   low risk scan. fixed inferior defect consistant with prior infarction with moderatly reduced EF. no appreciable evidence of ischemia. no significant change from previous study  . Hx of echocardiogram 08-15-2012   EF  45-50 % very difficult study, severe LVH, pacemaker, trace TR,mildly enlarged RV,; aorta mildly calcified  .  Pacemaker 2011   St. Jude Accent DDDR ; this is the third generator this pt. has had  . Shingles   . TIA (transient ischemic attack) 11/2018   vs stroke not seen on CT  . UTI (lower urinary tract infection)     Past Surgical History:  Procedure Laterality Date  . CARDIAC CATHETERIZATION  12-29-2003   rt. coronary angiography was done with a 6 french 4 cm taper coronary cath. This demonstrated  approximately 70 to 75% mildly segmental stenosis in the mid RCA before the acute margin          . CARDIAC CATHETERIZATION  11-19-2003   diagnostic cath,, pt. to get second cath to have stent placed  . cardiac stents    . CATARACT EXTRACTION    . KNEE SURGERY Right   . PPM GENERATOR CHANGEOUT N/A 04/07/2019   Procedure: PPM GENERATOR CHANGEOUT;  Surgeon: Sanda Klein, MD;  Location: [facility];  Service: Cardiovascular;  Laterality: N/A;  . PROSTATE BIOPSY    . prostate seed implants    . SPINE SURGERY    . WISDOM TOOTH EXTRACTION      Current Medications: Outpatient Medications Prior to Visit  Medication Sig Dispense Refill  . acetaminophen (TYLENOL) 500 MG tablet Take 500 mg by mouth 2 (two) times daily.     Marland Kitchen aspirin 81 MG tablet Take 81 mg by mouth every evening.     . BD VEO INSULIN SYRINGE U/F 31G X 15/64" 1 ML MISC USE TO ADMINSTER INSULIN SYRINGE TWICE DAILY    . calcitRIOL (ROCALTROL) 0.25 MCG capsule Take 0.25 mcg by mouth every Monday, Wednesday, and Friday. evening    . cephALEXin (KEFLEX) 500 MG capsule TAKE 1 CAPSULE BY MOUTH 4 TIMES DAILY FOR 10 DAYS    . Cholecalciferol (VITAMIN D) 50 MCG (2000 UT) tablet Take 2,000 Units by mouth every evening.    . clopidogrel (PLAVIX) 75 MG tablet Take 1 tablet by mouth once daily 90 tablet 3  . Coenzyme Q10 (COQ10) 100 MG CAPS Take 100 mg by mouth every other day. Alternate days with 200 mg    . Coenzyme Q10 (COQ10) 200 MG CAPS Take 200 mg by mouth every other day. Alternate days with 100 mg    . docusate sodium (COLACE) 100 MG  capsule Take 100 mg by mouth 2 (two) times daily.     . Emollient (UDDERLY SMOOTH EX) Apply 1 application topically daily.    . famotidine (PEPCID) 40 MG tablet Take 40 mg by mouth at bedtime.    . furosemide (LASIX) 20 MG tablet TAKE 1 TABLET BY MOUTH ONCE DAILY IN THE EVENING 30 tablet 10  . insulin NPH-regular Human (70-30) 100 UNIT/ML injection Inject 55-65 Units into the skin 2 (two) times daily with a meal.     . irbesartan (AVAPRO) 150 MG tablet Take 1 tablet by mouth once daily 90 tablet 3  . levothyroxine (SYNTHROID, LEVOTHROID) 25 MCG tablet Take 25 mcg by mouth daily.     . metoprolol succinate (TOPROL-XL) 25 MG 24 hr tablet TAKE 1 & 1/2 (ONE & ONE-HALF) TABLETS BY MOUTH ONCE DAILY 135  tablet 0  . mupirocin ointment (BACTROBAN) 2 % Apply topically 2 (two) times daily.    . nitroGLYCERIN (NITROSTAT) 0.4 MG SL tablet Place 1 tablet (0.4 mg total) under the tongue every 5 (five) minutes as needed for chest pain. 25 tablet 1  . Omega-3 Fatty Acids (FISH OIL) 1200 MG CAPS Take 2,400 mg by mouth every evening.    Glory Rosebush ULTRA test strip USE TWICE DAILY TO CHECK GLUCOSE PRIOR TO INSULIN USE    . polyethylene glycol powder (GLYCOLAX/MIRALAX) 17 GM/SCOOP powder Take by mouth.    . predniSONE (DELTASONE) 20 MG tablet Take 20 mg by mouth every morning.    . rosuvastatin (CRESTOR) 40 MG tablet Take 1 tablet by mouth once daily 90 tablet 3  . vitamin C (ASCORBIC ACID) 500 MG tablet Take 500 mg by mouth every evening.     . vitamin E 400 UNIT capsule Take 400 Units by mouth every evening.      Facility-Administered Medications Prior to Visit  Medication Dose Route Frequency Provider Last Rate Last Admin  . betamethasone acetate-betamethasone sodium phosphate (CELESTONE) injection 3 mg  3 mg Intramuscular Once Edrick Kins, DPM         Allergies:   Patient has no known allergies.   Social History   Socioeconomic History  . Marital status: Married    Spouse name: Not on file  . Number  of children: Not on file  . Years of education: Not on file  . Highest education level: Not on file  Occupational History  . Not on file  Tobacco Use  . Smoking status: Never Smoker  . Smokeless tobacco: Former Network engineer  . Vaping Use: Never used  Substance and Sexual Activity  . Alcohol use: Yes    Comment: daily wine  . Drug use: No  . Sexual activity: Not Currently    Partners: Female  Other Topics Concern  . Not on file  Social History Narrative   Right handed      Lives with wife, son and daughter in law      Highest level of edu- doctorate   Social Determinants of Health   Financial Resource Strain:   . Difficulty of Paying Living Expenses: Not on file  Food Insecurity:   . Worried About Charity fundraiser in the Last Year: Not on file  . Ran Out of Food in the Last Year: Not on file  Transportation Needs:   . Lack of Transportation (Medical): Not on file  . Lack of Transportation (Non-Medical): Not on file  Physical Activity:   . Days of Exercise per Week: Not on file  . Minutes of Exercise per Session: Not on file  Stress:   . Feeling of Stress : Not on file  Social Connections:   . Frequency of Communication with Friends and Family: Not on file  . Frequency of Social Gatherings with Friends and Family: Not on file  . Attends Religious Services: Not on file  . Active Member of Clubs or Organizations: Not on file  . Attends Archivist Meetings: Not on file  . Marital Status: Not on file     ROS:   Please see the history of present illness.    ROS  All other systems are reviewed and are negative.   PHYSICAL EXAM:   VS:  BP (!) 162/76   Pulse 94   Ht 5' 9.69" (1.77 m)   Wt 262 lb 12.8  oz (119.2 kg)   SpO2 97%   BMI 38.05 kg/m       General: Alert, oriented x3, no distress, severely obese, well-healed subclavian pacemaker site Head: no evidence of trauma, PERRL, EOMI, no exophtalmos or lid lag, no myxedema, no xanthelasma; normal  ears, nose and oropharynx Neck: normal jugular venous pulsations and no hepatojugular reflux; brisk carotid pulses without delay and no carotid bruits Chest: clear to auscultation, no signs of consolidation by percussion or palpation, normal fremitus, symmetrical and full respiratory excursions Cardiovascular: normal position and quality of the apical impulse, regular rhythm, normal first and paradoxically split second heart sounds, no murmurs, rubs or gallops Abdomen: no tenderness or distention, no masses by palpation, no abnormal pulsatility or arterial bruits, normal bowel sounds, no hepatosplenomegaly Extremities: no clubbing, cyanosis or edema; 2+ radial, ulnar and brachial pulses bilaterally; 2+ right femoral, posterior tibial and dorsalis pedis pulses; 2+ left femoral, posterior tibial and dorsalis pedis pulses; no subclavian or femoral bruits Neurological: grossly nonfocal Psych: Normal mood and affect    Wt Readings from Last 3 Encounters:  04/19/20 262 lb 12.8 oz (119.2 kg)  08/25/19 264 lb 6.4 oz (119.9 kg)  05/26/19 260 lb (117.9 kg)      Studies/Labs Reviewed:   EKG:  EKG is ordered today.  It shows atrial sensed (sinus), ventricular paced rhythm with very broad paced QRS at 168 ms Recent Labs:   06/17/2019 Total cholesterol 242, HDL 33, triglycerides 523 Hemoglobin A1c 7.3% 07/16/2019 Hemoglobin 16.2, creatinine 2.38 01/12/2020 Hemoglobin 15.9, creatinine 2.24, hemoglobin A1c 7.3%  Lipid Panel    Component Value Date/Time   CHOL 156 11/24/2018 2337   TRIG 236 (H) 11/24/2018 2337   HDL 35 (L) 11/24/2018 2337   CHOLHDL 4.5 11/24/2018 2337   VLDL 47 (H) 11/24/2018 2337   LDLCALC 74 11/24/2018 2337   LDLDIRECT 138 (H) 04/01/2015 4696    ASSESSMENT:    1. Chronic diastolic heart failure ([place])   2. SSS (sick sinus syndrome) ([place])   3. CHB (complete heart block) ([facility])   4. Pacemaker - dual chamber St. Jude RF   5. Coronary artery disease involving native  coronary artery of native heart without angina pectoris   6. Controlled type 2 diabetes mellitus with stage 4 chronic kidney disease, with long-term current use of insulin ([place])   7. Essential hypertension   8. Severe obesity (BMI 35.0-39.9) with comorbidity ([place])   9. Mixed hyperlipidemia   10. CKD (chronic kidney disease) stage 4, GFR 15-29 ml/min ([facility])   11. OSA (obstructive sleep apnea)   12. Stenosis of carotid artery, unspecified laterality      PLAN:  In order of problems listed above:  1. CHF: No overt evidence of hypervolemia, although body habitus makes assessment challenging.  Weight unchanged.  NYHA functional class IIIa.  With worsening kidney function would like to avoid excessive diuresis.  It is possible there is been some decline in cardiac performance with what is now 100% ventricular pacing, but LVEF is around 50% on the very recent echocardiogram.   Important to avoid sodium rich foods and reports significant or rapid weight gains promptly.  2. SSS: Appropriate heart rate histogram distribution with current sensor settings.  His dyspnea/fatigue does not appear to be due to chronotropic incompetence. 3. 3rd deg AVB: Very broad paced QRS.  Consider upgrade to CRT-P device if heart failure worsens and EF less than 40%. 4. PPM: Recent generator change.  Normal device function.  Continue remote downloads  every 3 months. 5. CAD: He does not have angina pectoris.  He is on dual antiplatelet and highly active statin therapy as well as beta-blockers in a relatively low dose.  S/p DES stenting proximal LAD stenosis and DX2 POBA, June, 2005 and staged RCA DES, August, 2005.  Avoid contrast based procedures with worsening renal function, unless he has clear-cut acute coronary syndrome. 6. DM: Fair, albeit not perfect glycemic control with hemoglobin A1c at 7.3%.  Followed by Dr. Buddy Duty, acceptable glycemic control for his age and comorbid conditions. 7. HTN: Recent BP checked at 114/60.   Slightly elevated blood pressure today, but no changes were made to his medications.   Follow-up with Dr. Justin Mend for adjustments in his blood pressure medicines. 8. Morbid obesity: Has lost some weight is now in severe obesity range.  Numerous comorbid conditions associated with this. 9. HLP: We will follow.  Elevated triglycerides makes it hard to accurately calculate the LDL cholesterol, but he is on maximum dose rosuvastatin.  I do not think there would be benefit in additional medications for lipid-lowering considering how lengthy his list of medications is and his renal dysfunction. 10. CKD stage 4: Stable renal function, likely primarily diabetic nephropathy. 11. OSA: Intolerant of CPAP.  This may also be contributing to his dyspnea. 12. History of stroke: Carotid ultrasound in July 2021 showed mild plaque without obstruction.  Previous studies had suggested some degree of left carotid stenosis.   Medication Adjustments/Labs and Tests Ordered: Current medicines are reviewed at length with the patient today.  Concerns regarding medicines are outlined above.  Medication changes, Labs and Tests ordered today are listed in the Patient Instructions below. Patient Instructions  Medication Instructions:  No changes *If you need a refill on your cardiac medications before your next appointment, please call your pharmacy*   Lab Work: None ordered If you have labs (blood work) drawn today and your tests are completely normal, you will receive your results only by: Marland Kitchen MyChart Message (if you have MyChart) OR . A paper copy in the mail If you have any lab test that is abnormal or we need to change your treatment, we will call you to review the results.   Testing/Procedures: None ordered   Follow-Up: At [facility], you and your health needs are our priority.  As part of our continuing mission to provide you with exceptional heart care, we have created designated Provider Care Teams.  These  Care Teams include your primary Cardiologist (physician) and Advanced Practice Providers (APPs -  Physician Assistants and Nurse Practitioners) who all work together to provide you with the care you need, when you need it.  We recommend signing up for the patient portal called "MyChart".  Sign up information is provided on this After Visit Summary.  MyChart is used to connect with patients for Virtual Visits (Telemedicine).  Patients are able to view lab/test results, encounter notes, upcoming appointments, etc.  Non-urgent messages can be sent to your provider as well.   To learn more about what you can do with MyChart, go to [email or web address].    Your next appointment:   12 month(s)  The format for your next appointment:   In Person  Provider:   Sanda Klein, MD       Signed, Sanda Klein, MD  04/19/2020 5:37 PM    [address], [place], [place]  [address] Phone: [phone]; Fax: [phone]

## 2020-04-21 ENCOUNTER — Other Ambulatory Visit: Payer: Self-pay | Admitting: Cardiovascular Disease

## 2020-04-22 ENCOUNTER — Other Ambulatory Visit: Payer: Self-pay | Admitting: *Deleted

## 2020-04-22 MED ORDER — METOPROLOL SUCCINATE ER 25 MG PO TB24: 25.0000 mg | ORAL_TABLET | Freq: Every day | ORAL | 11 refills | Status: DC

## 2020-04-28 ENCOUNTER — Other Ambulatory Visit: Payer: Self-pay | Admitting: Cardiovascular Disease

## 2020-05-05 DIAGNOSIS — D2262 Melanocytic nevi of left upper limb, including shoulder: Secondary | ICD-10-CM | POA: Diagnosis not present

## 2020-05-05 DIAGNOSIS — D2272 Melanocytic nevi of left lower limb, including hip: Secondary | ICD-10-CM | POA: Diagnosis not present

## 2020-05-05 DIAGNOSIS — L918 Other hypertrophic disorders of the skin: Secondary | ICD-10-CM | POA: Diagnosis not present

## 2020-05-05 DIAGNOSIS — Z85828 Personal history of other malignant neoplasm of skin: Secondary | ICD-10-CM | POA: Diagnosis not present

## 2020-05-05 DIAGNOSIS — D1801 Hemangioma of skin and subcutaneous tissue: Secondary | ICD-10-CM | POA: Diagnosis not present

## 2020-05-05 DIAGNOSIS — L304 Erythema intertrigo: Secondary | ICD-10-CM | POA: Diagnosis not present

## 2020-05-05 DIAGNOSIS — L821 Other seborrheic keratosis: Secondary | ICD-10-CM | POA: Diagnosis not present

## 2020-05-05 DIAGNOSIS — L570 Actinic keratosis: Secondary | ICD-10-CM | POA: Diagnosis not present

## 2020-06-17 DIAGNOSIS — N184 Chronic kidney disease, stage 4 (severe): Secondary | ICD-10-CM | POA: Diagnosis not present

## 2020-06-17 DIAGNOSIS — E1165 Type 2 diabetes mellitus with hyperglycemia: Secondary | ICD-10-CM | POA: Diagnosis not present

## 2020-06-17 DIAGNOSIS — Z794 Long term (current) use of insulin: Secondary | ICD-10-CM | POA: Diagnosis not present

## 2020-06-17 DIAGNOSIS — E039 Hypothyroidism, unspecified: Secondary | ICD-10-CM | POA: Diagnosis not present

## 2020-06-29 ENCOUNTER — Encounter: Payer: Self-pay | Admitting: Podiatry

## 2020-06-29 ENCOUNTER — Ambulatory Visit (INDEPENDENT_AMBULATORY_CARE_PROVIDER_SITE_OTHER): Payer: Medicare Other | Admitting: Podiatry

## 2020-06-29 ENCOUNTER — Other Ambulatory Visit: Payer: Self-pay

## 2020-06-29 ENCOUNTER — Ambulatory Visit (INDEPENDENT_AMBULATORY_CARE_PROVIDER_SITE_OTHER): Payer: Medicare Other

## 2020-06-29 DIAGNOSIS — D689 Coagulation defect, unspecified: Secondary | ICD-10-CM

## 2020-06-29 DIAGNOSIS — M79675 Pain in left toe(s): Secondary | ICD-10-CM

## 2020-06-29 DIAGNOSIS — B351 Tinea unguium: Secondary | ICD-10-CM | POA: Diagnosis not present

## 2020-06-29 DIAGNOSIS — Z794 Long term (current) use of insulin: Secondary | ICD-10-CM

## 2020-06-29 DIAGNOSIS — I441 Atrioventricular block, second degree: Secondary | ICD-10-CM | POA: Diagnosis not present

## 2020-06-29 DIAGNOSIS — E1140 Type 2 diabetes mellitus with diabetic neuropathy, unspecified: Secondary | ICD-10-CM | POA: Diagnosis not present

## 2020-06-29 DIAGNOSIS — M79674 Pain in right toe(s): Secondary | ICD-10-CM

## 2020-06-29 LAB — CUP PACEART REMOTE DEVICE CHECK
Battery Remaining Longevity: 120 mo
Battery Remaining Percentage: 95.5 %
Battery Voltage: 3.01 V
Brady Statistic AP VP Percent: 25 %
Brady Statistic AP VS Percent: 1 %
Brady Statistic AS VP Percent: 74 %
Brady Statistic AS VS Percent: 1 %
Brady Statistic RA Percent Paced: 24 %
Brady Statistic RV Percent Paced: 99 %
Date Time Interrogation Session: 20220208040015
Implantable Lead Implant Date: 19901207
Implantable Lead Implant Date: 19901207
Implantable Lead Location: 753859
Implantable Lead Location: 753860
Implantable Pulse Generator Implant Date: 20201116
Lead Channel Impedance Value: 300 Ohm
Lead Channel Impedance Value: 550 Ohm
Lead Channel Pacing Threshold Amplitude: 0.875 V
Lead Channel Pacing Threshold Amplitude: 1 V
Lead Channel Pacing Threshold Pulse Width: 0.4 ms
Lead Channel Pacing Threshold Pulse Width: 0.4 ms
Lead Channel Sensing Intrinsic Amplitude: 12 mV
Lead Channel Sensing Intrinsic Amplitude: 3.3 mV
Lead Channel Setting Pacing Amplitude: 1.25 V
Lead Channel Setting Pacing Amplitude: 2 V
Lead Channel Setting Pacing Pulse Width: 0.4 ms
Lead Channel Setting Sensing Sensitivity: 3 mV
Pulse Gen Model: 2272
Pulse Gen Serial Number: 9177614

## 2020-06-29 NOTE — Progress Notes (Signed)
This patient returns to my office for at risk foot care.  This patient requires this care by a professional since this patient will be at risk due to having diabetes and chronic kidney disease and coagulation defect.  Patient is taking plavix.  This patient is unable to cut nails himself since the patient cannot reach his nails.These nails are painful walking and wearing shoes.  This patient presents for at risk foot care today.  General Appearance  Alert, conversant and in no acute stress.  Vascular  Dorsalis pedis and posterior tibial  pulses are palpable  bilaterally.  Capillary return is within normal limits  bilaterally. Temperature is within normal limits  bilaterally.  Neurologic  Senn-Weinstein monofilament wire test within normal limits  bilaterally. Muscle power within normal limits bilaterally.  Nails Thick disfigured discolored nails with subungual debris  from hallux to fifth toes bilaterally. No evidence of bacterial infection or drainage bilaterally.  Orthopedic  No limitations of motion  feet .  No crepitus or effusions noted.  No bony pathology or digital deformities noted.  Skin  normotropic skin with no porokeratosis noted bilaterally.  No signs of infections or ulcers noted.     Onychomycosis  Pain in right toes  Pain in left toes  Consent was obtained for treatment procedures.   Mechanical debridement of nails 1-5  bilaterally performed with a nail nipper.  Filed with dremel without incident.    Return office visit   3 months                  Told patient to return for periodic foot care and evaluation due to potential at risk complications.   [name] DPM

## 2020-07-05 NOTE — Progress Notes (Signed)
Remote pacemaker transmission.   

## 2020-07-19 ENCOUNTER — Other Ambulatory Visit: Payer: Self-pay | Admitting: Cardiovascular Disease

## 2020-08-18 DIAGNOSIS — N184 Chronic kidney disease, stage 4 (severe): Secondary | ICD-10-CM | POA: Diagnosis not present

## 2020-08-18 DIAGNOSIS — I495 Sick sinus syndrome: Secondary | ICD-10-CM | POA: Diagnosis not present

## 2020-08-18 DIAGNOSIS — E785 Hyperlipidemia, unspecified: Secondary | ICD-10-CM | POA: Diagnosis not present

## 2020-08-18 DIAGNOSIS — Z6841 Body Mass Index (BMI) 40.0 and over, adult: Secondary | ICD-10-CM | POA: Diagnosis not present

## 2020-08-18 DIAGNOSIS — N189 Chronic kidney disease, unspecified: Secondary | ICD-10-CM | POA: Diagnosis not present

## 2020-08-18 DIAGNOSIS — N2581 Secondary hyperparathyroidism of renal origin: Secondary | ICD-10-CM | POA: Diagnosis not present

## 2020-08-18 DIAGNOSIS — I129 Hypertensive chronic kidney disease with stage 1 through stage 4 chronic kidney disease, or unspecified chronic kidney disease: Secondary | ICD-10-CM | POA: Diagnosis not present

## 2020-08-18 DIAGNOSIS — I2510 Atherosclerotic heart disease of native coronary artery without angina pectoris: Secondary | ICD-10-CM | POA: Diagnosis not present

## 2020-08-18 DIAGNOSIS — E1122 Type 2 diabetes mellitus with diabetic chronic kidney disease: Secondary | ICD-10-CM | POA: Diagnosis not present

## 2020-09-11 ENCOUNTER — Other Ambulatory Visit: Payer: Self-pay | Admitting: Cardiovascular Disease

## 2020-09-20 ENCOUNTER — Other Ambulatory Visit: Payer: Self-pay | Admitting: Cardiovascular Disease

## 2020-09-28 ENCOUNTER — Other Ambulatory Visit: Payer: Self-pay

## 2020-09-28 ENCOUNTER — Encounter: Payer: Self-pay | Admitting: Podiatry

## 2020-09-28 ENCOUNTER — Ambulatory Visit (INDEPENDENT_AMBULATORY_CARE_PROVIDER_SITE_OTHER): Payer: Medicare Other

## 2020-09-28 ENCOUNTER — Ambulatory Visit (INDEPENDENT_AMBULATORY_CARE_PROVIDER_SITE_OTHER): Payer: Medicare Other | Admitting: Podiatry

## 2020-09-28 DIAGNOSIS — Z794 Long term (current) use of insulin: Secondary | ICD-10-CM | POA: Diagnosis not present

## 2020-09-28 DIAGNOSIS — D689 Coagulation defect, unspecified: Secondary | ICD-10-CM | POA: Diagnosis not present

## 2020-09-28 DIAGNOSIS — B351 Tinea unguium: Secondary | ICD-10-CM | POA: Diagnosis not present

## 2020-09-28 DIAGNOSIS — M79675 Pain in left toe(s): Secondary | ICD-10-CM | POA: Diagnosis not present

## 2020-09-28 DIAGNOSIS — I441 Atrioventricular block, second degree: Secondary | ICD-10-CM

## 2020-09-28 DIAGNOSIS — E1140 Type 2 diabetes mellitus with diabetic neuropathy, unspecified: Secondary | ICD-10-CM

## 2020-09-28 DIAGNOSIS — M79674 Pain in right toe(s): Secondary | ICD-10-CM | POA: Diagnosis not present

## 2020-09-28 LAB — CUP PACEART REMOTE DEVICE CHECK
Battery Remaining Longevity: 120 mo
Battery Remaining Percentage: 95.5 %
Battery Voltage: 3.01 V
Brady Statistic AP VP Percent: 22 %
Brady Statistic AP VS Percent: 1 %
Brady Statistic AS VP Percent: 77 %
Brady Statistic AS VS Percent: 1 %
Brady Statistic RA Percent Paced: 20 %
Brady Statistic RV Percent Paced: 99 %
Date Time Interrogation Session: 20220510050820
Implantable Lead Implant Date: 19901207
Implantable Lead Implant Date: 19901207
Implantable Lead Location: 753859
Implantable Lead Location: 753860
Implantable Pulse Generator Implant Date: 20201116
Lead Channel Impedance Value: 300 Ohm
Lead Channel Impedance Value: 550 Ohm
Lead Channel Pacing Threshold Amplitude: 0.875 V
Lead Channel Pacing Threshold Amplitude: 1.25 V
Lead Channel Pacing Threshold Pulse Width: 0.4 ms
Lead Channel Pacing Threshold Pulse Width: 0.4 ms
Lead Channel Sensing Intrinsic Amplitude: 12 mV
Lead Channel Sensing Intrinsic Amplitude: 4.4 mV
Lead Channel Setting Pacing Amplitude: 1.5 V
Lead Channel Setting Pacing Amplitude: 2 V
Lead Channel Setting Pacing Pulse Width: 0.4 ms
Lead Channel Setting Sensing Sensitivity: 3 mV
Pulse Gen Model: 2272
Pulse Gen Serial Number: 9177614

## 2020-09-28 NOTE — Progress Notes (Signed)
This patient returns to my office for at risk foot care.  This patient requires this care by a professional since this patient will be at risk due to having diabetes and chronic kidney disease and coagulation defect.  Patient is taking plavix.  This patient is unable to cut nails himself since the patient cannot reach his nails.These nails are painful walking and wearing shoes.  This patient presents for at risk foot care today.  General Appearance  Alert, conversant and in no acute stress.  Vascular  Dorsalis pedis and posterior tibial  pulses are palpable  bilaterally.  Capillary return is within normal limits  bilaterally. Temperature is within normal limits  bilaterally.  Neurologic  Senn-Weinstein monofilament wire test within normal limits  bilaterally. Muscle power within normal limits bilaterally.  Nails Thick disfigured discolored nails with subungual debris  from hallux to fifth toes bilaterally. No evidence of bacterial infection or drainage bilaterally.  Orthopedic  No limitations of motion  feet .  No crepitus or effusions noted.  No bony pathology or digital deformities noted.  Skin  normotropic skin with no porokeratosis noted bilaterally.  No signs of infections or ulcers noted.     Onychomycosis  Pain in right toes  Pain in left toes  Consent was obtained for treatment procedures.   Mechanical debridement of nails 1-5  bilaterally performed with a nail nipper.  Filed with dremel without incident.    Return office visit   3 months                  Told patient to return for periodic foot care and evaluation due to potential at risk complications.   [name] DPM

## 2020-10-15 DIAGNOSIS — Z23 Encounter for immunization: Secondary | ICD-10-CM | POA: Diagnosis not present

## 2020-10-20 NOTE — Progress Notes (Signed)
Remote pacemaker transmission.   

## 2020-11-11 DIAGNOSIS — E119 Type 2 diabetes mellitus without complications: Secondary | ICD-10-CM | POA: Diagnosis not present

## 2020-11-11 DIAGNOSIS — H26492 Other secondary cataract, left eye: Secondary | ICD-10-CM | POA: Diagnosis not present

## 2020-11-17 ENCOUNTER — Telehealth: Payer: Self-pay | Admitting: Cardiovascular Disease

## 2020-11-17 NOTE — Telephone Encounter (Signed)
STAT if HR is under 50 or over 120 (normal HR is 60-100 beats per minute)  What is your heart rate? 107 ten minutes ago  Do you have a log of your heart rate readings (document readings)? 97, ranging in 90's, 107 ten minutes ago  Do you have any other symptoms? No   Patient states he woke up and when he checked his vitals his HR was 97. He states it has been in th 90's this morning and ten minutes ago it was 107. He states he is not having any other symptoms. He states yesterday he went to [employer] for a haircut and no one was wearing masks. He also states he took a laxative.

## 2020-11-17 NOTE — Telephone Encounter (Signed)
Returned call to patient who states he woke up feeling anxious and felt his heart pounding at approximately 0700 today. He had unusual dreams through the night. He is concerned about his HR which has been 92-110 bpm since he woke up. He is using pulse oximeter and BP cuff to monitor. BP readings are:  138/70, 120/60 mm HG. Denies chest discomfort and no change in chronic dyspnea.  Denies irregular HR or palpitations currently. I had him measure HR again and it continues to be 92-110 bpm depending on movement. He last took metoprolol succinate 25 mg on 6/28 in the afternoon. I advised him to take 25 mg now and that I will call him back in a few hours to see how his HR is responding. He asks if he needs to send a device transmission but he is not familiar with the equipment because his transmissions are all done automatically. I advised that if it is felt that that is needed when I call him back, that we will advise him on how to send. He verbalized understanding and agreement and thanked me for the call.

## 2020-11-17 NOTE — Telephone Encounter (Signed)
Returned call to patient who reports he is feeling well, sitting at his computer working on something. Current HR is 90 bpm. He took metoprolol 25 mg at approximately 0930 today. He has 2 BP cuffs and he has gotten 2 different readings, both WNL. He denies worsening symptoms or additional concerns. I gave him instructions on sending a manual device transmission and advised that I will forward message to Dr. Sallyanne Kuster and our device team for review. Advised him that someone from our office will call him back with any additional recommendations from Dr. Sallyanne Kuster. Patient verbalized understanding and thanked me for the call.

## 2020-11-17 NOTE — Telephone Encounter (Signed)
Called patient and advised him no medication changes per Dr. Sallyanne Kuster. Patient states his HR has returned to normal. He thanked me for the call.

## 2020-12-03 DIAGNOSIS — Z20822 Contact with and (suspected) exposure to covid-19: Secondary | ICD-10-CM | POA: Diagnosis not present

## 2020-12-15 DIAGNOSIS — N184 Chronic kidney disease, stage 4 (severe): Secondary | ICD-10-CM | POA: Diagnosis not present

## 2020-12-15 DIAGNOSIS — Z794 Long term (current) use of insulin: Secondary | ICD-10-CM | POA: Diagnosis not present

## 2020-12-15 DIAGNOSIS — E1165 Type 2 diabetes mellitus with hyperglycemia: Secondary | ICD-10-CM | POA: Diagnosis not present

## 2020-12-15 DIAGNOSIS — E1142 Type 2 diabetes mellitus with diabetic polyneuropathy: Secondary | ICD-10-CM | POA: Diagnosis not present

## 2020-12-15 DIAGNOSIS — E039 Hypothyroidism, unspecified: Secondary | ICD-10-CM | POA: Diagnosis not present

## 2020-12-15 DIAGNOSIS — K5900 Constipation, unspecified: Secondary | ICD-10-CM | POA: Diagnosis not present

## 2020-12-16 DIAGNOSIS — N184 Chronic kidney disease, stage 4 (severe): Secondary | ICD-10-CM | POA: Diagnosis not present

## 2020-12-16 DIAGNOSIS — Z6841 Body Mass Index (BMI) 40.0 and over, adult: Secondary | ICD-10-CM | POA: Diagnosis not present

## 2020-12-16 DIAGNOSIS — I129 Hypertensive chronic kidney disease with stage 1 through stage 4 chronic kidney disease, or unspecified chronic kidney disease: Secondary | ICD-10-CM | POA: Diagnosis not present

## 2020-12-16 DIAGNOSIS — N189 Chronic kidney disease, unspecified: Secondary | ICD-10-CM | POA: Diagnosis not present

## 2020-12-16 DIAGNOSIS — N2581 Secondary hyperparathyroidism of renal origin: Secondary | ICD-10-CM | POA: Diagnosis not present

## 2020-12-16 DIAGNOSIS — E785 Hyperlipidemia, unspecified: Secondary | ICD-10-CM | POA: Diagnosis not present

## 2020-12-16 DIAGNOSIS — E1122 Type 2 diabetes mellitus with diabetic chronic kidney disease: Secondary | ICD-10-CM | POA: Diagnosis not present

## 2020-12-16 DIAGNOSIS — I495 Sick sinus syndrome: Secondary | ICD-10-CM | POA: Diagnosis not present

## 2020-12-16 DIAGNOSIS — I2510 Atherosclerotic heart disease of native coronary artery without angina pectoris: Secondary | ICD-10-CM | POA: Diagnosis not present

## 2020-12-28 ENCOUNTER — Ambulatory Visit (INDEPENDENT_AMBULATORY_CARE_PROVIDER_SITE_OTHER): Payer: Medicare Other

## 2020-12-28 DIAGNOSIS — I495 Sick sinus syndrome: Secondary | ICD-10-CM

## 2020-12-28 LAB — CUP PACEART REMOTE DEVICE CHECK
Battery Remaining Longevity: 99 mo
Battery Remaining Percentage: 85 %
Battery Voltage: 3.01 V
Brady Statistic AP VP Percent: 21 %
Brady Statistic AP VS Percent: 1 %
Brady Statistic AS VP Percent: 78 %
Brady Statistic AS VS Percent: 1 %
Brady Statistic RA Percent Paced: 20 %
Brady Statistic RV Percent Paced: 99 %
Date Time Interrogation Session: 20220809040013
Implantable Lead Implant Date: 19901207
Implantable Lead Implant Date: 19901207
Implantable Lead Location: 753859
Implantable Lead Location: 753860
Implantable Pulse Generator Implant Date: 20201116
Lead Channel Impedance Value: 300 Ohm
Lead Channel Impedance Value: 560 Ohm
Lead Channel Pacing Threshold Amplitude: 0.875 V
Lead Channel Pacing Threshold Amplitude: 1.25 V
Lead Channel Pacing Threshold Pulse Width: 0.4 ms
Lead Channel Pacing Threshold Pulse Width: 0.4 ms
Lead Channel Sensing Intrinsic Amplitude: 12 mV
Lead Channel Sensing Intrinsic Amplitude: 4.5 mV
Lead Channel Setting Pacing Amplitude: 1.5 V
Lead Channel Setting Pacing Amplitude: 2 V
Lead Channel Setting Pacing Pulse Width: 0.4 ms
Lead Channel Setting Sensing Sensitivity: 3 mV
Pulse Gen Model: 2272
Pulse Gen Serial Number: 9177614

## 2020-12-29 ENCOUNTER — Other Ambulatory Visit: Payer: Self-pay

## 2020-12-29 ENCOUNTER — Ambulatory Visit (INDEPENDENT_AMBULATORY_CARE_PROVIDER_SITE_OTHER): Payer: Medicare Other | Admitting: Podiatry

## 2020-12-29 ENCOUNTER — Encounter: Payer: Self-pay | Admitting: Podiatry

## 2020-12-29 DIAGNOSIS — D689 Coagulation defect, unspecified: Secondary | ICD-10-CM | POA: Diagnosis not present

## 2020-12-29 DIAGNOSIS — M79675 Pain in left toe(s): Secondary | ICD-10-CM

## 2020-12-29 DIAGNOSIS — B351 Tinea unguium: Secondary | ICD-10-CM | POA: Diagnosis not present

## 2020-12-29 DIAGNOSIS — Z794 Long term (current) use of insulin: Secondary | ICD-10-CM | POA: Diagnosis not present

## 2020-12-29 DIAGNOSIS — M79674 Pain in right toe(s): Secondary | ICD-10-CM | POA: Diagnosis not present

## 2020-12-29 DIAGNOSIS — E1140 Type 2 diabetes mellitus with diabetic neuropathy, unspecified: Secondary | ICD-10-CM | POA: Diagnosis not present

## 2020-12-29 NOTE — Progress Notes (Signed)
This patient returns to my office for at risk foot care.  This patient requires this care by a professional since this patient will be at risk due to having diabetes and chronic kidney disease and coagulation defect.  Patient is taking plavix.  This patient is unable to cut nails himself since the patient cannot reach his nails.These nails are painful walking and wearing shoes.  This patient presents for at risk foot care today.  General Appearance  Alert, conversant and in no acute stress.  Vascular  Dorsalis pedis and posterior tibial  pulses are palpable  bilaterally.  Capillary return is within normal limits  bilaterally. Temperature is within normal limits  bilaterally.  Neurologic  Senn-Weinstein monofilament wire test within normal limits  bilaterally. Muscle power within normal limits bilaterally.  Nails Thick disfigured discolored nails with subungual debris  from hallux to fifth toes bilaterally. No evidence of bacterial infection or drainage bilaterally.  Orthopedic  No limitations of motion  feet .  No crepitus or effusions noted.  No bony pathology or digital deformities noted.  Skin  normotropic skin with no porokeratosis noted bilaterally.  No signs of infections or ulcers noted.     Onychomycosis  Pain in right toes  Pain in left toes  Consent was obtained for treatment procedures.   Mechanical debridement of nails 1-5  bilaterally performed with a nail nipper.  Filed with dremel without incident.    Return office visit   3 months                  Told patient to return for periodic foot care and evaluation due to potential at risk complications.   [name] DPM

## 2021-01-19 NOTE — Progress Notes (Signed)
Remote pacemaker transmission.   

## 2021-02-03 ENCOUNTER — Encounter: Payer: Self-pay | Admitting: Skilled Nursing Facility1

## 2021-02-03 ENCOUNTER — Encounter: Payer: Medicare Other | Attending: Internal Medicine | Admitting: Skilled Nursing Facility1

## 2021-02-03 ENCOUNTER — Other Ambulatory Visit: Payer: Self-pay

## 2021-02-03 DIAGNOSIS — Z794 Long term (current) use of insulin: Secondary | ICD-10-CM | POA: Diagnosis present

## 2021-02-03 DIAGNOSIS — N1832 Chronic kidney disease, stage 3b: Secondary | ICD-10-CM | POA: Insufficient documentation

## 2021-02-03 DIAGNOSIS — E1122 Type 2 diabetes mellitus with diabetic chronic kidney disease: Secondary | ICD-10-CM | POA: Insufficient documentation

## 2021-02-03 NOTE — Progress Notes (Signed)
Diabetes Self-Management Education  Visit Type:     02/03/2021  Mr. [name], identified by name and date of birth, is a 87 y.o. male with a diagnosis of Diabetes:  .   ASSESSMENT   Pt states his main concern is constipation. Pt state she drinks smooth move and other laxatives in the week.   Patient is here today with his wife.  He would like to learn more about how to have more bowel movements and not feel so constipated  History includes Type 2 Diabetes (1990), stage 4 CKD Medications include NPH 65 units before breakfast and dinner.  He decreases or increases the amount based on his BG.  Pt states he checks his blood sugar 2 times a day: fasting 104, 113, 98, 138, 118, 125, 191 (states he ate ice cream); before eating dinner 208, 131, 178, 119, 134, 248, 184, 216, 248, 219, 202 statign he thinks his A1C is 7 something (7.5 in Epic)  Pt states his cardiologist wants him to reduced his blood pressure medicaiton. Bp reading: 112/70 heart rate 75, 57, 66, 71  Pt states he has never had any low blood sugars.   Pt states he eats about 6-7 fruits per day.  He lives with his wife.  His son and daughter-in-law also lives with them (in a separate apartment in the house).Marland Kitchen   His wife does the cooking and daughter in Sports coach.  He is a retired Fish farm manager.  He was born in [place], lived in [place] and then moved to the [place] with his family as young adults.  Individualized Plan for Diabetes Self-Management Training:   Learning Objective:  Patient will have a greater understanding of diabetes self-management. Patient education plan is to attend individual and/or group sessions per assessed needs and concerns.  Patient is to increase his fibrous non starchy vegetables, decrease his fruits, double his fluid consumption and ask his doctor if Linzess is appropriate for him.   Plan:   There are no Patient Instructions on file for this visit.  Expected Outcomes:   Pt seems motivated  Science writer provided: [facility] National Kidney Diet placemat  If problems or questions, patient to contact team via:  Regulatory affairs officer  Future DSME appointment:  PRN

## 2021-02-23 DIAGNOSIS — Z23 Encounter for immunization: Secondary | ICD-10-CM | POA: Diagnosis not present

## 2021-03-29 ENCOUNTER — Ambulatory Visit (INDEPENDENT_AMBULATORY_CARE_PROVIDER_SITE_OTHER): Payer: Medicare Other

## 2021-03-29 DIAGNOSIS — I495 Sick sinus syndrome: Secondary | ICD-10-CM

## 2021-03-29 LAB — CUP PACEART REMOTE DEVICE CHECK
Battery Remaining Longevity: 97 mo
Battery Remaining Percentage: 82 %
Battery Voltage: 3.01 V
Brady Statistic AP VP Percent: 20 %
Brady Statistic AP VS Percent: 1 %
Brady Statistic AS VP Percent: 80 %
Brady Statistic AS VS Percent: 1 %
Brady Statistic RA Percent Paced: 19 %
Brady Statistic RV Percent Paced: 99 %
Date Time Interrogation Session: 20221108030023
Implantable Lead Implant Date: 19901207
Implantable Lead Implant Date: 19901207
Implantable Lead Location: 753859
Implantable Lead Location: 753860
Implantable Pulse Generator Implant Date: 20201116
Lead Channel Impedance Value: 300 Ohm
Lead Channel Impedance Value: 560 Ohm
Lead Channel Pacing Threshold Amplitude: 0.875 V
Lead Channel Pacing Threshold Amplitude: 1.125 V
Lead Channel Pacing Threshold Pulse Width: 0.4 ms
Lead Channel Pacing Threshold Pulse Width: 0.4 ms
Lead Channel Sensing Intrinsic Amplitude: 4.8 mV
Lead Channel Sensing Intrinsic Amplitude: 6.8 mV
Lead Channel Setting Pacing Amplitude: 1.375
Lead Channel Setting Pacing Amplitude: 2 V
Lead Channel Setting Pacing Pulse Width: 0.4 ms
Lead Channel Setting Sensing Sensitivity: 3 mV
Pulse Gen Model: 2272
Pulse Gen Serial Number: 9177614

## 2021-04-04 ENCOUNTER — Other Ambulatory Visit: Payer: Self-pay

## 2021-04-04 ENCOUNTER — Ambulatory Visit (INDEPENDENT_AMBULATORY_CARE_PROVIDER_SITE_OTHER): Payer: Medicare Other | Admitting: Podiatry

## 2021-04-04 ENCOUNTER — Encounter: Payer: Self-pay | Admitting: Podiatry

## 2021-04-04 DIAGNOSIS — M79675 Pain in left toe(s): Secondary | ICD-10-CM

## 2021-04-04 DIAGNOSIS — B351 Tinea unguium: Secondary | ICD-10-CM | POA: Diagnosis not present

## 2021-04-04 DIAGNOSIS — D689 Coagulation defect, unspecified: Secondary | ICD-10-CM

## 2021-04-04 DIAGNOSIS — L84 Corns and callosities: Secondary | ICD-10-CM | POA: Diagnosis not present

## 2021-04-04 DIAGNOSIS — M79674 Pain in right toe(s): Secondary | ICD-10-CM

## 2021-04-04 DIAGNOSIS — E1140 Type 2 diabetes mellitus with diabetic neuropathy, unspecified: Secondary | ICD-10-CM

## 2021-04-04 DIAGNOSIS — Z794 Long term (current) use of insulin: Secondary | ICD-10-CM | POA: Diagnosis not present

## 2021-04-04 NOTE — Progress Notes (Signed)
This patient returns to my office for at risk foot care.  This patient requires this care by a professional since this patient will be at risk due to having diabetes and chronic kidney disease and coagulation defect.  Patient is taking plavix.  This patient is unable to cut nails himself since the patient cannot reach his nails.These nails are painful walking and wearing shoes.  This patient presents for at risk foot care today.  General Appearance  Alert, conversant and in no acute stress.  Vascular  Dorsalis pedis and posterior tibial  pulses are palpable  bilaterally.  Capillary return is within normal limits  bilaterally. Temperature is within normal limits  bilaterally.  Neurologic  Senn-Weinstein monofilament wire test within normal limits  bilaterally. Muscle power within normal limits bilaterally.  Nails Thick disfigured discolored nails with subungual debris  from hallux to fifth toes bilaterally. No evidence of bacterial infection or drainage bilaterally.  Orthopedic  No limitations of motion  feet .  No crepitus or effusions noted.  No bony pathology or digital deformities noted.  Skin  normotropic skin with no porokeratosis noted bilaterally.  No signs of infections or ulcers noted.   Clavi 3rd toe right foot.  Onychomycosis  Pain in right toes  Pain in left toes  Clavi right foot.  Consent was obtained for treatment procedures.   Mechanical debridement of nails 1-5  bilaterally performed with a nail nipper.  Filed with dremel without incident.  Debride clavi with # 15 blade.   Return office visit   3 months                  Told patient to return for periodic foot care and evaluation due to potential at risk complications.   Donnavin Vandenbrink DPM  

## 2021-04-06 NOTE — Progress Notes (Signed)
Remote pacemaker transmission.   

## 2021-04-08 DIAGNOSIS — E785 Hyperlipidemia, unspecified: Secondary | ICD-10-CM | POA: Diagnosis not present

## 2021-04-08 DIAGNOSIS — N189 Chronic kidney disease, unspecified: Secondary | ICD-10-CM | POA: Diagnosis not present

## 2021-04-08 DIAGNOSIS — Z6841 Body Mass Index (BMI) 40.0 and over, adult: Secondary | ICD-10-CM | POA: Diagnosis not present

## 2021-04-08 DIAGNOSIS — N2581 Secondary hyperparathyroidism of renal origin: Secondary | ICD-10-CM | POA: Diagnosis not present

## 2021-04-08 DIAGNOSIS — N184 Chronic kidney disease, stage 4 (severe): Secondary | ICD-10-CM | POA: Diagnosis not present

## 2021-04-08 DIAGNOSIS — E1122 Type 2 diabetes mellitus with diabetic chronic kidney disease: Secondary | ICD-10-CM | POA: Diagnosis not present

## 2021-04-08 DIAGNOSIS — I495 Sick sinus syndrome: Secondary | ICD-10-CM | POA: Diagnosis not present

## 2021-04-08 DIAGNOSIS — I129 Hypertensive chronic kidney disease with stage 1 through stage 4 chronic kidney disease, or unspecified chronic kidney disease: Secondary | ICD-10-CM | POA: Diagnosis not present

## 2021-04-08 DIAGNOSIS — I2510 Atherosclerotic heart disease of native coronary artery without angina pectoris: Secondary | ICD-10-CM | POA: Diagnosis not present

## 2021-04-14 ENCOUNTER — Other Ambulatory Visit: Payer: Self-pay | Admitting: Cardiovascular Disease

## 2021-04-21 ENCOUNTER — Other Ambulatory Visit: Payer: Self-pay | Admitting: Cardiovascular Disease

## 2021-04-28 DIAGNOSIS — Z20822 Contact with and (suspected) exposure to covid-19: Secondary | ICD-10-CM | POA: Diagnosis not present

## 2021-05-01 ENCOUNTER — Observation Stay (HOSPITAL_COMMUNITY)
Admission: EM | Admit: 2021-05-01 | Discharge: 2021-05-02 | Disposition: A | Discharge status: Home / Self Care | Payer: Medicare Other | Attending: Student | Admitting: Student

## 2021-05-01 ENCOUNTER — Other Ambulatory Visit: Payer: Self-pay

## 2021-05-01 ENCOUNTER — Encounter (HOSPITAL_COMMUNITY): Payer: Self-pay | Admitting: Emergency Medicine

## 2021-05-01 DIAGNOSIS — J9811 Atelectasis: Secondary | ICD-10-CM | POA: Diagnosis not present

## 2021-05-01 DIAGNOSIS — Z20822 Contact with and (suspected) exposure to covid-19: Secondary | ICD-10-CM | POA: Insufficient documentation

## 2021-05-01 DIAGNOSIS — E11649 Type 2 diabetes mellitus with hypoglycemia without coma: Secondary | ICD-10-CM | POA: Diagnosis not present

## 2021-05-01 DIAGNOSIS — I130 Hypertensive heart and chronic kidney disease with heart failure and stage 1 through stage 4 chronic kidney disease, or unspecified chronic kidney disease: Secondary | ICD-10-CM | POA: Diagnosis not present

## 2021-05-01 DIAGNOSIS — I2510 Atherosclerotic heart disease of native coronary artery without angina pectoris: Secondary | ICD-10-CM | POA: Insufficient documentation

## 2021-05-01 DIAGNOSIS — R4182 Altered mental status, unspecified: Secondary | ICD-10-CM | POA: Diagnosis not present

## 2021-05-01 DIAGNOSIS — G9340 Encephalopathy, unspecified: Secondary | ICD-10-CM | POA: Insufficient documentation

## 2021-05-01 DIAGNOSIS — I5042 Chronic combined systolic (congestive) and diastolic (congestive) heart failure: Secondary | ICD-10-CM | POA: Diagnosis not present

## 2021-05-01 DIAGNOSIS — N183 Chronic kidney disease, stage 3 unspecified: Secondary | ICD-10-CM | POA: Diagnosis present

## 2021-05-01 DIAGNOSIS — Y900 Blood alcohol level of less than 20 mg/100 ml: Secondary | ICD-10-CM | POA: Insufficient documentation

## 2021-05-01 DIAGNOSIS — E1122 Type 2 diabetes mellitus with diabetic chronic kidney disease: Secondary | ICD-10-CM | POA: Diagnosis not present

## 2021-05-01 DIAGNOSIS — R0602 Shortness of breath: Secondary | ICD-10-CM | POA: Diagnosis not present

## 2021-05-01 DIAGNOSIS — N1832 Chronic kidney disease, stage 3b: Secondary | ICD-10-CM | POA: Diagnosis not present

## 2021-05-01 DIAGNOSIS — Z950 Presence of cardiac pacemaker: Secondary | ICD-10-CM | POA: Diagnosis not present

## 2021-05-01 DIAGNOSIS — Z794 Long term (current) use of insulin: Secondary | ICD-10-CM | POA: Diagnosis not present

## 2021-05-01 DIAGNOSIS — I10 Essential (primary) hypertension: Secondary | ICD-10-CM | POA: Diagnosis not present

## 2021-05-01 DIAGNOSIS — R410 Disorientation, unspecified: Secondary | ICD-10-CM | POA: Diagnosis not present

## 2021-05-01 DIAGNOSIS — E039 Hypothyroidism, unspecified: Secondary | ICD-10-CM | POA: Diagnosis not present

## 2021-05-01 DIAGNOSIS — Z8673 Personal history of transient ischemic attack (TIA), and cerebral infarction without residual deficits: Secondary | ICD-10-CM | POA: Diagnosis not present

## 2021-05-01 DIAGNOSIS — R2681 Unsteadiness on feet: Secondary | ICD-10-CM | POA: Insufficient documentation

## 2021-05-01 DIAGNOSIS — R0902 Hypoxemia: Secondary | ICD-10-CM | POA: Diagnosis not present

## 2021-05-01 DIAGNOSIS — E162 Hypoglycemia, unspecified: Secondary | ICD-10-CM

## 2021-05-01 DIAGNOSIS — Z79899 Other long term (current) drug therapy: Secondary | ICD-10-CM | POA: Insufficient documentation

## 2021-05-01 DIAGNOSIS — R778 Other specified abnormalities of plasma proteins: Secondary | ICD-10-CM | POA: Diagnosis not present

## 2021-05-01 DIAGNOSIS — I517 Cardiomegaly: Secondary | ICD-10-CM | POA: Diagnosis not present

## 2021-05-01 DIAGNOSIS — Z7982 Long term (current) use of aspirin: Secondary | ICD-10-CM | POA: Diagnosis not present

## 2021-05-01 DIAGNOSIS — I959 Hypotension, unspecified: Secondary | ICD-10-CM | POA: Diagnosis not present

## 2021-05-01 DIAGNOSIS — R0600 Dyspnea, unspecified: Secondary | ICD-10-CM | POA: Diagnosis not present

## 2021-05-01 NOTE — ED Triage Notes (Signed)
Patient arrived with EMS from home reports SOB worse with exertion onset this week , no fever or cough , denies chest pain .

## 2021-05-02 ENCOUNTER — Observation Stay (HOSPITAL_BASED_OUTPATIENT_CLINIC_OR_DEPARTMENT_OTHER): Payer: Medicare Other

## 2021-05-02 ENCOUNTER — Emergency Department (HOSPITAL_COMMUNITY): Payer: Medicare Other

## 2021-05-02 DIAGNOSIS — R410 Disorientation, unspecified: Secondary | ICD-10-CM

## 2021-05-02 DIAGNOSIS — E11649 Type 2 diabetes mellitus with hypoglycemia without coma: Secondary | ICD-10-CM | POA: Diagnosis not present

## 2021-05-02 DIAGNOSIS — E1142 Type 2 diabetes mellitus with diabetic polyneuropathy: Secondary | ICD-10-CM | POA: Insufficient documentation

## 2021-05-02 DIAGNOSIS — I2510 Atherosclerotic heart disease of native coronary artery without angina pectoris: Secondary | ICD-10-CM

## 2021-05-02 DIAGNOSIS — Z794 Long term (current) use of insulin: Secondary | ICD-10-CM

## 2021-05-02 DIAGNOSIS — Z950 Presence of cardiac pacemaker: Secondary | ICD-10-CM

## 2021-05-02 DIAGNOSIS — R4182 Altered mental status, unspecified: Secondary | ICD-10-CM | POA: Diagnosis not present

## 2021-05-02 DIAGNOSIS — R0602 Shortness of breath: Secondary | ICD-10-CM | POA: Diagnosis not present

## 2021-05-02 DIAGNOSIS — E1122 Type 2 diabetes mellitus with diabetic chronic kidney disease: Secondary | ICD-10-CM | POA: Diagnosis not present

## 2021-05-02 DIAGNOSIS — N1832 Chronic kidney disease, stage 3b: Secondary | ICD-10-CM

## 2021-05-02 DIAGNOSIS — R2990 Unspecified symptoms and signs involving the nervous system: Secondary | ICD-10-CM

## 2021-05-02 DIAGNOSIS — I5022 Chronic systolic (congestive) heart failure: Secondary | ICD-10-CM

## 2021-05-02 DIAGNOSIS — I517 Cardiomegaly: Secondary | ICD-10-CM | POA: Diagnosis not present

## 2021-05-02 DIAGNOSIS — J9811 Atelectasis: Secondary | ICD-10-CM | POA: Diagnosis not present

## 2021-05-02 HISTORY — DX: Long term (current) use of insulin: Z79.4

## 2021-05-02 HISTORY — DX: Type 2 diabetes mellitus with diabetic polyneuropathy: E11.42

## 2021-05-02 HISTORY — DX: Type 2 diabetes mellitus with hypoglycemia without coma: E11.649

## 2021-05-02 LAB — URINALYSIS, ROUTINE W REFLEX MICROSCOPIC
Bilirubin Urine: NEGATIVE
Glucose, UA: 50 mg/dL — AB
Hgb urine dipstick: NEGATIVE
Ketones, ur: NEGATIVE mg/dL
Leukocytes,Ua: NEGATIVE
Nitrite: NEGATIVE
Protein, ur: NEGATIVE mg/dL
Specific Gravity, Urine: 1.013 (ref 1.005–1.030)
pH: 5 (ref 5.0–8.0)

## 2021-05-02 LAB — RENAL FUNCTION PANEL
Albumin: 3.7 g/dL (ref 3.5–5.0)
Anion gap: 14 (ref 5–15)
BUN: 54 mg/dL — ABNORMAL HIGH (ref 8–23)
CO2: 22 mmol/L (ref 22–32)
Calcium: 9.2 mg/dL (ref 8.9–10.3)
Chloride: 100 mmol/L (ref 98–111)
Creatinine, Ser: 2.49 mg/dL — ABNORMAL HIGH (ref 0.61–1.24)
GFR, Estimated: 24 mL/min — ABNORMAL LOW (ref >60)
Glucose, Bld: 60 mg/dL — ABNORMAL LOW (ref 70–99)
Phosphorus: 5.1 mg/dL — ABNORMAL HIGH (ref 2.5–4.6)
Potassium: 4.6 mmol/L (ref 3.5–5.1)
Sodium: 136 mmol/L (ref 135–145)

## 2021-05-02 LAB — CBC WITH DIFFERENTIAL/PLATELET
Abs Immature Granulocytes: 0.04 10*3/uL (ref 0.00–0.07)
Basophils Absolute: 0.1 10*3/uL (ref 0.0–0.1)
Basophils Relative: 1 %
Eosinophils Absolute: 0.1 10*3/uL (ref 0.0–0.5)
Eosinophils Relative: 1 %
HCT: 46.9 % (ref 39.0–52.0)
Hemoglobin: 14.9 g/dL (ref 13.0–17.0)
Immature Granulocytes: 0 %
Lymphocytes Relative: 19 %
Lymphs Abs: 2.1 10*3/uL (ref 0.7–4.0)
MCH: 30 pg (ref 26.0–34.0)
MCHC: 31.8 g/dL (ref 30.0–36.0)
MCV: 94.6 fL (ref 80.0–100.0)
Monocytes Absolute: 1.3 10*3/uL — ABNORMAL HIGH (ref 0.1–1.0)
Monocytes Relative: 11 %
Neutro Abs: 7.5 10*3/uL (ref 1.7–7.7)
Neutrophils Relative %: 68 %
Platelets: 159 10*3/uL (ref 150–400)
RBC: 4.96 MIL/uL (ref 4.22–5.81)
RDW: 14.2 % (ref 11.5–15.5)
WBC: 11 10*3/uL — ABNORMAL HIGH (ref 4.0–10.5)
nRBC: 0 % (ref 0.0–0.2)

## 2021-05-02 LAB — RAPID URINE DRUG SCREEN, HOSP PERFORMED
Amphetamines: NOT DETECTED
Barbiturates: NOT DETECTED
Benzodiazepines: NOT DETECTED
Cocaine: NOT DETECTED
Opiates: NOT DETECTED
Tetrahydrocannabinol: NOT DETECTED

## 2021-05-02 LAB — LACTIC ACID, PLASMA
Lactic Acid, Venous: 0.8 mmol/L (ref 0.5–1.9)
Lactic Acid, Venous: 0.9 mmol/L (ref 0.5–1.9)

## 2021-05-02 LAB — COMPREHENSIVE METABOLIC PANEL
ALT: 29 U/L (ref 0–44)
AST: 28 U/L (ref 15–41)
Albumin: 3.6 g/dL (ref 3.5–5.0)
Alkaline Phosphatase: 44 U/L (ref 38–126)
Anion gap: 10 (ref 5–15)
BUN: 55 mg/dL — ABNORMAL HIGH (ref 8–23)
CO2: 24 mmol/L (ref 22–32)
Calcium: 9.1 mg/dL (ref 8.9–10.3)
Chloride: 99 mmol/L (ref 98–111)
Creatinine, Ser: 2.51 mg/dL — ABNORMAL HIGH (ref 0.61–1.24)
GFR, Estimated: 24 mL/min — ABNORMAL LOW (ref >60)
Glucose, Bld: 59 mg/dL — ABNORMAL LOW (ref 70–99)
Potassium: 4.2 mmol/L (ref 3.5–5.1)
Sodium: 133 mmol/L — ABNORMAL LOW (ref 135–145)
Total Bilirubin: 0.9 mg/dL (ref 0.3–1.2)
Total Protein: 6.9 g/dL (ref 6.5–8.1)

## 2021-05-02 LAB — RESP PANEL BY RT-PCR (FLU A&B, COVID) ARPGX2
Influenza A by PCR: NEGATIVE
Influenza B by PCR: NEGATIVE
SARS Coronavirus 2 by RT PCR: NEGATIVE

## 2021-05-02 LAB — I-STAT CHEM 8, ED
BUN: 56 mg/dL — ABNORMAL HIGH (ref 8–23)
Calcium, Ion: 1.13 mmol/L — ABNORMAL LOW (ref 1.15–1.40)
Chloride: 103 mmol/L (ref 98–111)
Creatinine, Ser: 2.5 mg/dL — ABNORMAL HIGH (ref 0.61–1.24)
Glucose, Bld: 61 mg/dL — ABNORMAL LOW (ref 70–99)
HCT: 48 % (ref 39.0–52.0)
Hemoglobin: 16.3 g/dL (ref 13.0–17.0)
Potassium: 4.3 mmol/L (ref 3.5–5.1)
Sodium: 134 mmol/L — ABNORMAL LOW (ref 135–145)
TCO2: 24 mmol/L (ref 22–32)

## 2021-05-02 LAB — MAGNESIUM: Magnesium: 2.5 mg/dL — ABNORMAL HIGH (ref 1.7–2.4)

## 2021-05-02 LAB — CBG MONITORING, ED
Glucose-Capillary: 59 mg/dL — ABNORMAL LOW (ref 70–99)
Glucose-Capillary: 86 mg/dL (ref 70–99)
Glucose-Capillary: 129 mg/dL — ABNORMAL HIGH (ref 70–99)
Glucose-Capillary: 117 mg/dL — ABNORMAL HIGH (ref 70–99)
Glucose-Capillary: 151 mg/dL — ABNORMAL HIGH (ref 70–99)

## 2021-05-02 LAB — TROPONIN I (HIGH SENSITIVITY)
Troponin I (High Sensitivity): 34 ng/L — ABNORMAL HIGH (ref <18)
Troponin I (High Sensitivity): 30 ng/L — ABNORMAL HIGH (ref <18)

## 2021-05-02 LAB — BRAIN NATRIURETIC PEPTIDE: B Natriuretic Peptide: 55.4 pg/mL (ref 0.0–100.0)

## 2021-05-02 LAB — AMMONIA: Ammonia: 29 umol/L (ref 9–35)

## 2021-05-02 LAB — HEMOGLOBIN A1C
Hgb A1c MFr Bld: 7.1 % — ABNORMAL HIGH (ref 4.8–5.6)
Mean Plasma Glucose: 157.07 mg/dL

## 2021-05-02 LAB — ETHANOL: Alcohol, Ethyl (B): <10 mg/dL (ref <10)

## 2021-05-02 LAB — ECHOCARDIOGRAM COMPLETE
Area-P 1/2: 5.68 cm2
S' Lateral: 2.8 cm

## 2021-05-02 MED ORDER — LEVOTHYROXINE SODIUM 25 MCG PO TABS
25.0000 ug | ORAL_TABLET | Freq: Every day | ORAL | Status: DC
  Administered 2021-05-02: 25 ug via ORAL
  Filled 2021-05-02: qty 1

## 2021-05-02 MED ORDER — ROSUVASTATIN CALCIUM 20 MG PO TABS
40.0000 mg | ORAL_TABLET | Freq: Every evening | ORAL | Status: DC
  Administered 2021-05-02: 40 mg via ORAL
  Filled 2021-05-02: qty 2

## 2021-05-02 MED ORDER — INSULIN ASPART 100 UNIT/ML IJ SOLN: 0.0000 [IU] | Freq: Every day | INTRAMUSCULAR | Status: DC

## 2021-05-02 MED ORDER — ACETAMINOPHEN 325 MG PO TABS: 650.0000 mg | ORAL_TABLET | Freq: Four times a day (QID) | ORAL | Status: DC | PRN

## 2021-05-02 MED ORDER — INSULIN NPH ISOPHANE & REGULAR (70-30) 100 UNIT/ML ~~LOC~~ SUSP
50.0000 [IU] | Freq: Two times a day (BID) | SUBCUTANEOUS | 1 refills | Status: DC
Start: 2021-05-02 — End: 2022-05-22

## 2021-05-02 MED ORDER — LACTATED RINGERS IV SOLN: INTRAVENOUS | Status: AC

## 2021-05-02 MED ORDER — DEXTROSE 50 % IV SOLN
25.0000 mL | Freq: Once | INTRAVENOUS | Status: AC
  Administered 2021-05-02: 25 mL via INTRAVENOUS
  Filled 2021-05-02: qty 50

## 2021-05-02 MED ORDER — SENNOSIDES-DOCUSATE SODIUM 8.6-50 MG PO TABS
1.0000 | ORAL_TABLET | Freq: Two times a day (BID) | ORAL | Status: DC
  Administered 2021-05-02: 1 via ORAL
  Filled 2021-05-02: qty 1

## 2021-05-02 MED ORDER — PERFLUTREN LIPID MICROSPHERE
1.0000 mL | INTRAVENOUS | Status: AC | PRN
Start: 2021-05-02 — End: 2021-05-02
  Administered 2021-05-02: 2 mL via INTRAVENOUS
  Filled 2021-05-02: qty 10

## 2021-05-02 MED ORDER — FAMOTIDINE 20 MG PO TABS
40.0000 mg | ORAL_TABLET | Freq: Every evening | ORAL | Status: DC
  Administered 2021-05-02: 40 mg via ORAL
  Filled 2021-05-02: qty 2

## 2021-05-02 MED ORDER — CLOPIDOGREL BISULFATE 75 MG PO TABS
75.0000 mg | ORAL_TABLET | Freq: Every evening | ORAL | Status: DC
  Administered 2021-05-02: 75 mg via ORAL
  Filled 2021-05-02: qty 1

## 2021-05-02 MED ORDER — ONDANSETRON HCL 4 MG/2ML IJ SOLN: 4.0000 mg | Freq: Four times a day (QID) | INTRAMUSCULAR | Status: DC | PRN

## 2021-05-02 MED ORDER — ONDANSETRON HCL 4 MG PO TABS: 4.0000 mg | ORAL_TABLET | Freq: Four times a day (QID) | ORAL | Status: DC | PRN

## 2021-05-02 MED ORDER — INSULIN ASPART 100 UNIT/ML IJ SOLN
0.0000 [IU] | Freq: Three times a day (TID) | INTRAMUSCULAR | Status: DC
  Administered 2021-05-02: 2 [IU] via SUBCUTANEOUS
  Administered 2021-05-02: 3 [IU] via SUBCUTANEOUS

## 2021-05-02 MED ORDER — FAMOTIDINE 20 MG PO TABS: 20.0000 mg | ORAL_TABLET | Freq: Every day | ORAL | 1 refills | Status: DC

## 2021-05-02 MED ORDER — METOPROLOL SUCCINATE ER 25 MG PO TB24
25.0000 mg | ORAL_TABLET | Freq: Every evening | ORAL | Status: DC
  Administered 2021-05-02: 25 mg via ORAL
  Filled 2021-05-02: qty 1

## 2021-05-02 MED ORDER — LACTATED RINGERS IV BOLUS
500.0000 mL | Freq: Once | INTRAVENOUS | Status: AC
  Administered 2021-05-02: 500 mL via INTRAVENOUS

## 2021-05-02 MED ORDER — ACETAMINOPHEN 650 MG RE SUPP: 650.0000 mg | Freq: Four times a day (QID) | RECTAL | Status: DC | PRN

## 2021-05-02 MED ORDER — HEPARIN SODIUM (PORCINE) 5000 UNIT/ML IJ SOLN
5000.0000 [IU] | Freq: Three times a day (TID) | INTRAMUSCULAR | Status: DC
  Administered 2021-05-02 (×2): 5000 [IU] via SUBCUTANEOUS
  Filled 2021-05-02 (×2): qty 1

## 2021-05-02 MED ORDER — POLYETHYLENE GLYCOL 3350 17 G PO PACK
17.0000 g | PACK | Freq: Two times a day (BID) | ORAL | Status: DC
  Administered 2021-05-02: 17 g via ORAL
  Filled 2021-05-02: qty 1

## 2021-05-02 MED ORDER — ASPIRIN EC 81 MG PO TBEC
81.0000 mg | DELAYED_RELEASE_TABLET | Freq: Every evening | ORAL | Status: DC
  Administered 2021-05-02: 81 mg via ORAL
  Filled 2021-05-02: qty 1

## 2021-05-02 MED ORDER — FUROSEMIDE 20 MG PO TABS: 20.0000 mg | ORAL_TABLET | Freq: Every evening | ORAL | 9 refills | Status: DC

## 2021-05-02 MED ORDER — STROKE: EARLY STAGES OF RECOVERY BOOK
Freq: Once | Status: DC
  Filled 2021-05-02: qty 1

## 2021-05-02 MED ORDER — IRBESARTAN 150 MG PO TABS: 150.0000 mg | ORAL_TABLET | Freq: Every day | ORAL | 3 refills | Status: DC

## 2021-05-02 NOTE — ED Provider Notes (Signed)
Emergency Medicine Provider Triage Evaluation Note  [name] , a 87 y.o. male  was evaluated in triage.  Pt complains of worsening confusion over 4 days.  History is obtained from patient, wife and EMS. Wife reports that about 4 days ago she noted that he stopped doing his normal routines.  He has had very low energy.  He has had shortness of breath worsening today.  She feels like he has "brain fog" and is "slow on the uptake."  No temperatures over 100.4 reported.  No weakness, numbness or tingling Reported.  He doesn't use oxygen at home per report.   Review of Systems  Positive: See above Negative: See above  Physical Exam  BP 109/79   Pulse 69   Temp 97.7 F (36.5 C)   Resp 18   SpO2 95%  Gen:   Awake, appears unwell, pale Resp:  Increased effort MSK:   Moves extremities without difficulty  Other:  Patient with lungs ctab.  He appears to be unwell.    Medical Decision Making  Medically screening exam initiated at 12:03 AM.  Appropriate orders placed.  [name] was informed that the remainder of the evaluation will be completed by another provider, this initial triage assessment does not replace that evaluation, and the importance of remaining in the ED until their evaluation is complete.  Patient does not appear to meet history criteria at this time.  He feels like his confusion is improving however he is unable to tell me what year it is which his wife states is abnormal. This seems like it has been more of a 4 to 5-day gradual progression.  Will order labs, CT head, chest x-ray given shortness of breath.  Note: Portions of this report may have been transcribed using voice recognition software. Every effort was made to ensure accuracy; however, inadvertent computerized transcription errors may be present      Ollen Gross 05/02/21 0015    Ezequiel Essex, MD 05/02/21 [phone]

## 2021-05-02 NOTE — Assessment & Plan Note (Signed)
Holding off on insulin due to diabetic hypoglycemia.

## 2021-05-02 NOTE — Assessment & Plan Note (Signed)
Patient still taking his insulin despite having decreased oral intake.  Had several blood sugars less than 60.  Could explain his altered mental status.

## 2021-05-02 NOTE — ED Provider Notes (Signed)
[facility] EMERGENCY DEPARTMENT Provider Note   CSN: [id number] Arrival date & time: 05/01/21  2314     History Chief Complaint  Patient presents with   Shortness of Breath    [name] is a 87 y.o. male.  Patient with a history of UTI, TIA, pacemaker, diabetes, carotid stenosis here with confusion and "not as quick as usual".  Last seen normal was 3 days ago.  Wife reports over the past several days he has had increased fatigue, not doing his usual activities, not acting like himself.  Patient himself feels like he is slow to respond, not answering questions as quickly as he should and may confused about his medications and not able to say what he wants to say.  He is concerned he could have had a stroke like he has had before.  Wife reports she called EMS tonight because he seemed to have increased trouble breathing as well as a temperature of 99.  No cough.  No fever over 100.4.  No chest pain.  He still feels somewhat short of breath which she attributes to his chronic kidney disease. He denies any headache or visual changes. He denies any focal weakness in his arms or legs.  No numbness or tingling.  Wife reports he is slow to respond and seemingly confused about what he wants to say and having difficulty responding as quickly as usual. No recent medication changes  The history is provided by the patient and a relative. The history is limited by the condition of the patient.  Shortness of Breath Associated symptoms: no abdominal pain, no chest pain, no cough, no fever, no headaches, no rash and no vomiting       Past Medical History:  Diagnosis Date   Abnormal Doppler ultrasound of carotid artery 12/16/2012   mildly abnormal doppler . no prior studies to compare to. follow up studies are recommended when clinically indicated.   Acute respiratory failure with hypoxia (HCC) 11/2018   Blepharitis    Cancer (HCC)    Carotid stenosis, left 11/2018   on left  60%   Diabetes mellitus without complication (HCC)    Exogenous obesity    Gastric ulcer    GI bleed    History of stress test 06/06/2012   low risk scan. fixed inferior defect consistant with prior infarction with moderatly reduced EF. no appreciable evidence of ischemia. no significant change from previous study   Hx of echocardiogram 08-15-2012   EF  45-50 % very difficult study, severe LVH, pacemaker, trace TR,mildly enlarged RV,; aorta mildly calcified   Pacemaker 2011   St. Jude Accent DDDR ; this is the third generator this pt. has had   Shingles    TIA (transient ischemic attack) 11/2018   vs stroke not seen on CT   UTI (lower urinary tract infection)     Patient Active Problem List   Diagnosis Date Noted   CHB (complete heart block) ([place]) 08/27/2019   Pacemaker battery depletion 04/07/2019   Acute respiratory failure with hypoxia (HCC) 11/26/2018   Chest pain 11/25/2018   Elevated troponin 11/25/2018   Transient neurological symptoms 11/24/2018   Hypothyroidism 11/24/2018   Pain due to onychomycosis of toenails of both feet 11/13/2018   Morbid obesity ([place]) 02/18/2016   Chronic combined systolic and diastolic heart failure ([place]) 02/19/2015   Sinus node dysfunction 03/09/2014   Second degree AV block 03/09/2014   Pacemaker - dual chamber St. Jude RF 07/24/2013  Cardiomyopathy, ischemic 07/24/2013   CAD (coronary artery disease) 07/24/2013   OSA (obstructive sleep apnea) 07/24/2013   CKD (chronic kidney disease) stage 3, GFR 30-59 ml/min ([place]) 07/24/2013   DM type 2 causing CKD stage 3 ([place]) 07/24/2013   Mixed hyperlipidemia 07/24/2013    Past Surgical History:  Procedure Laterality Date   CARDIAC CATHETERIZATION  12-29-2003   rt. coronary angiography was done with a 6 french 4 cm taper coronary cath. This demonstrated  approximately 70 to 75% mildly segmental stenosis in the mid RCA before the acute margin           CARDIAC CATHETERIZATION  11-19-2003   diagnostic  cath,, pt. to get second cath to have stent placed   cardiac stents     CATARACT EXTRACTION     KNEE SURGERY Right    PPM GENERATOR CHANGEOUT N/A 04/07/2019   Procedure: PPM GENERATOR CHANGEOUT;  Surgeon: Sanda Klein, MD;  Location: [facility];  Service: Cardiovascular;  Laterality: N/A;   PROSTATE BIOPSY     prostate seed implants     SPINE SURGERY     WISDOM TOOTH EXTRACTION         Family History  Problem Relation Age of Onset   Heart disease Father     Social History   Tobacco Use   Smoking status: Never   Smokeless tobacco: Former  Scientific laboratory technician Use: Never used  Substance Use Topics   Alcohol use: Yes    Comment: daily wine   Drug use: No    Home Medications Prior to Admission medications   Medication Sig Start Date End Date Taking? Authorizing Provider  acetaminophen (TYLENOL) 500 MG tablet Take 500 mg by mouth 2 (two) times daily.    [provider]  aspirin 81 MG tablet Take 81 mg by mouth every evening.     [provider]  BD VEO INSULIN SYRINGE U/F 31G X 15/64" 1 ML MISC USE TO ADMINSTER INSULIN SYRINGE TWICE DAILY 04/01/19   [provider]  calcitRIOL (ROCALTROL) 0.25 MCG capsule Take 0.25 mcg by mouth every Monday, Wednesday, and Friday. evening    [provider]  cephALEXin (KEFLEX) 500 MG capsule TAKE 1 CAPSULE BY MOUTH 4 TIMES DAILY FOR 10 DAYS 02/20/19   [provider]  Cholecalciferol (VITAMIN D) 50 MCG (2000 UT) tablet Take 2,000 Units by mouth every evening.    [provider]  clopidogrel (PLAVIX) 75 MG tablet Take 1 tablet by mouth once daily 04/22/21   Croitoru, Mihai, MD  Coenzyme Q10 (COQ10) 100 MG CAPS Take 100 mg by mouth every other day. Alternate days with 200 mg    [provider]  Coenzyme Q10 (COQ10) 200 MG CAPS Take 200 mg by mouth every other day. Alternate days with 100 mg    [provider]  docusate sodium (COLACE) 100 MG capsule Take 100 mg by  mouth 2 (two) times daily.     [provider]  Emollient (UDDERLY SMOOTH EX) Apply 1 application topically daily.    [provider]  famotidine (PEPCID) 40 MG tablet Take 40 mg by mouth at bedtime. 03/21/19   [provider]  furosemide (LASIX) 20 MG tablet TAKE 1 TABLET BY MOUTH ONCE DAILY IN THE EVENING 09/20/20   Croitoru, Mihai, MD  insulin NPH-regular Human (70-30) 100 UNIT/ML injection Inject 55-65 Units into the skin 2 (two) times daily with a meal.     [provider]  irbesartan (AVAPRO) 150 MG tablet Take 1 tablet by mouth once daily 09/13/20   Croitoru, Mihai, MD  levothyroxine (SYNTHROID, LEVOTHROID) 25 MCG tablet Take 25 mcg by mouth daily.  02/09/14   [provider]  metoprolol succinate (TOPROL-XL) 25 MG 24 hr tablet Take 1 tablet (25 mg total) by mouth daily. 04/22/20   Croitoru, Mihai, MD  mupirocin ointment (BACTROBAN) 2 % Apply topically 2 (two) times daily. 10/27/19   [provider]  nitroGLYCERIN (NITROSTAT) 0.4 MG SL tablet Place 1 tablet (0.4 mg total) under the tongue every 5 (five) minutes as needed for chest pain. 12/27/18   Croitoru, Mihai, MD  Omega-3 Fatty Acids (FISH OIL) 1200 MG CAPS Take 2,400 mg by mouth every evening.    [provider]  [facility] ULTRA test strip USE TWICE DAILY TO CHECK GLUCOSE PRIOR TO INSULIN USE 05/10/19   [provider]  polyethylene glycol powder (GLYCOLAX/MIRALAX) 17 GM/SCOOP powder Take by mouth.    [provider]  predniSONE (DELTASONE) 20 MG tablet Take 20 mg by mouth every morning. 10/18/19   [provider]  rosuvastatin (CRESTOR) 40 MG tablet Take 1 tablet by mouth once daily 04/18/21   Croitoru, Mihai, MD  vitamin C (ASCORBIC ACID) 500 MG tablet Take 500 mg by mouth every evening.     [provider]  vitamin E 400 UNIT capsule Take 400 Units by mouth every evening.     [provider]    Allergies    Patient has no known  allergies.  Review of Systems   Review of Systems  Constitutional:  Positive for activity change, appetite change and fatigue. Negative for fever.  HENT:  Negative for congestion, rhinorrhea and sinus pressure.   Respiratory:  Positive for shortness of breath. Negative for cough and chest tightness.   Cardiovascular:  Negative for chest pain.  Gastrointestinal:  Negative for abdominal pain, nausea and vomiting.  Genitourinary:  Negative for dysuria and hematuria.  Musculoskeletal:  Negative for arthralgias, back pain and myalgias.  Skin:  Negative for rash.  Neurological:  Positive for weakness. Negative for dizziness, light-headedness and headaches.   all other systems are negative except as noted in the HPI and PMH.   Physical Exam Updated Vital Signs BP (!) 147/73 (BP Location: Right Arm)   Pulse 64   Temp 97.7 F (36.5 C) (Oral)   Resp 20   SpO2 99%   Physical Exam Vitals and nursing note reviewed.  Constitutional:      General: He is not in acute distress.    Appearance: He is well-developed. He is obese.     Comments: Fatigued appearing  Oriented to person, place and time  HENT:     Head: Normocephalic and atraumatic.     Nose: No rhinorrhea.     Mouth/Throat:     Pharynx: No oropharyngeal exudate.  Eyes:     Conjunctiva/sclera: Conjunctivae normal.     Pupils: Pupils are equal, round, and reactive to light.  Neck:     Comments: No meningismus. Cardiovascular:     Rate and Rhythm: Normal rate and regular rhythm.     Heart sounds: Normal heart sounds. No murmur heard. Pulmonary:     Effort: Pulmonary effort is normal. No respiratory distress.     Breath sounds: Normal breath sounds.     Comments: Diminished at bases Abdominal:     Palpations: Abdomen is soft.     Tenderness: There is no abdominal tenderness. There is no  guarding or rebound.  Musculoskeletal:        General: No tenderness. Normal range of motion.     Cervical back: Normal range of motion and  neck supple.  Skin:    General: Skin is warm.     Findings: No rash.  Neurological:     General: No focal deficit present.     Mental Status: He is alert and oriented to person, place, and time. Mental status is at baseline.     Cranial Nerves: No cranial nerve deficit.     Motor: No abnormal muscle tone.     Coordination: Coordination normal.     Comments:  5/5 strength throughout. CN 2-12 intact.Equal grip strength.   Psychiatric:        Behavior: Behavior normal.    ED Results / Procedures / Treatments   Labs (all labs ordered are listed, but only abnormal results are displayed) Labs Reviewed  COMPREHENSIVE METABOLIC PANEL - Abnormal; Notable for the following components:      Result Value   Sodium 133 (*)    Glucose, Bld 59 (*)    BUN 55 (*)    Creatinine, Ser 2.51 (*)    GFR, Estimated 24 (*)    All other components within normal limits  CBC WITH DIFFERENTIAL/PLATELET - Abnormal; Notable for the following components:   WBC 11.0 (*)    Monocytes Absolute 1.3 (*)    All other components within normal limits  URINALYSIS, ROUTINE W REFLEX MICROSCOPIC - Abnormal; Notable for the following components:   Glucose, UA 50 (*)    All other components within normal limits  HEMOGLOBIN A1C - Abnormal; Notable for the following components:   Hgb A1c MFr Bld 7.1 (*)    All other components within normal limits  RENAL FUNCTION PANEL - Abnormal; Notable for the following components:   Glucose, Bld 60 (*)    BUN 54 (*)    Creatinine, Ser 2.49 (*)    Phosphorus 5.1 (*)    GFR, Estimated 24 (*)    All other components within normal limits  MAGNESIUM - Abnormal; Notable for the following components:   Magnesium 2.5 (*)    All other components within normal limits  CBG MONITORING, ED - Abnormal; Notable for the following components:   Glucose-Capillary 59 (*)    All other components within normal limits  I-STAT CHEM 8, ED - Abnormal; Notable for the following components:   Sodium 134  (*)    BUN 56 (*)    Creatinine, Ser 2.50 (*)    Glucose, Bld 61 (*)    Calcium, Ion 1.13 (*)    All other components within normal limits  CBG MONITORING, ED - Abnormal; Notable for the following components:   Glucose-Capillary 129 (*)    All other components within normal limits  TROPONIN I (HIGH SENSITIVITY) - Abnormal; Notable for the following components:   Troponin I (High Sensitivity) 34 (*)    All other components within normal limits  TROPONIN I (HIGH SENSITIVITY) - Abnormal; Notable for the following components:   Troponin I (High Sensitivity) 30 (*)    All other components within normal limits  RESP PANEL BY RT-PCR (FLU A&B, COVID) ARPGX2  LACTIC ACID, PLASMA  LACTIC ACID, PLASMA  ETHANOL  RAPID URINE DRUG SCREEN, HOSP PERFORMED  AMMONIA  BRAIN NATRIURETIC PEPTIDE  CBG MONITORING, ED    EKG EKG Interpretation  Date/Time:  Sunday May 01 2021 23:25:42 EST Ventricular Rate:  71 PR Interval:  210 QRS Duration: 172 QT Interval:  456 QTC Calculation: 495 R Axis:   -58 Text Interpretation: Atrial-sensed ventricular-paced rhythm with prolonged AV conduction Abnormal ECG No significant change was found Confirmed by Ezequiel Essex [phone]) on 05/02/2021 3:23:40 AM  Radiology DG Chest 2 View  Result Date: 05/02/2021 CLINICAL DATA:  Shortness of breath. EXAM: CHEST - 2 VIEW COMPARISON:  09/28/2009. FINDINGS: The heart enlarged and mediastinal contours are within normal limits. Lung volumes are extremely low and there is mild atelectasis at the left lung base. No definite effusion or pneumothorax. No acute osseous abnormality. A dual lead pacemaker is present over the right chest. IMPRESSION: 1. Cardiomegaly. 2. Extremely low lung volumes with atelectasis at the left lung base. Electronically Signed   By: Brett Fairy M.D.   On: 05/02/2021 00:30   CT HEAD WO CONTRAST (5MM)  Result Date: 05/02/2021 CLINICAL DATA:  Mental status change, unknown cause. EXAM: CT HEAD  WITHOUT CONTRAST TECHNIQUE: Contiguous axial images were obtained from the base of the skull through the vertex without intravenous contrast. COMPARISON:  11/26/2018. FINDINGS: Brain: No acute intracranial hemorrhage, midline shift or mass effect. No extra-axial fluid collection. Diffuse atrophy is noted. Subcortical and periventricular white matter hypodensities are noted bilaterally. There is no hydrocephalus Vascular: No hyperdense vessel or unexpected calcification. Skull: Normal. Negative for fracture or focal lesion. Sinuses/Orbits: There is mucosal thickening in the right maxillary sinus with mucoperiosteal elevation. There is mild mucosal thickening in the ethmoid air cells and frontal sinuses. Bilateral lens replacement surgery bilaterally. Other: None. IMPRESSION: 1. No acute intracranial process. 2. Atrophy with extensive chronic microvascular ischemic changes. 3. Chronic right maxillary sinus disease. Electronically Signed   By: Brett Fairy M.D.   On: 05/02/2021 00:53    Procedures Procedures   Medications Ordered in ED Medications - No data to display  ED Course  I have reviewed the triage vital signs and the nursing notes.  Pertinent labs & imaging results that were available during my care of the patient were reviewed by me and considered in my medical decision making (see chart for details).    MDM Rules/Calculators/A&P                          3 to 4 days of gradual onset progression of confusion, difficulty with answering questions, altered mental status.  Patient is alert and oriented x3 on arrival but somewhat slow to respond. He and his wife both feel that he is having word finding difficulties and are concerned for stroke. He is not a candidate for tPA due to delay in presentation.   Neurological exam is nonfocal.  CT head in triage is negative.  Labs show creatinine at baseline EKG is paced  Interrogation of pacemaker shows no episodes of high ventricular rate.  99% V  paced.  Mildly hypoglycemic in the 50s and 60s IV dextrose given HYpoglycemia may be contributing to perceived confusion and word finding difficulties.   UA negative. COVID negative.   OBservation admission for stroke work up d/w Dr Bridgett Larsson. PPM thought to be MRI compatible.    Final Clinical Impression(s) / ED Diagnoses Final diagnoses:  Acute encephalopathy  Hypoglycemia    Rx / DC Orders ED Discharge Orders     None        Collen Hostler, Annie Main, MD 05/02/21 [phone]

## 2021-05-02 NOTE — ED Notes (Signed)
Pt discharged and wheeled out of the ED in a wheel chair with family without difficulty.

## 2021-05-02 NOTE — Assessment & Plan Note (Addendum)
Observation medical bed. MRI brain without contrast due to underlying CKD.  Patient symptoms for the last 4 days.  Outside tPA window.  Patient is actually alert and orient x3.  Has no focal weakness.  He only notices confusion but some word finding.  Altered mental status could also be due to diabetic hyperglycemia.  Patient describes a different sensation when he urinates.  Denies any pain with urination or burning with urination.  Still awaiting UA.  Patient has had chills but no documented fever.  Holding off on antibiotics until UA is obtained.

## 2021-05-02 NOTE — ED Notes (Signed)
CT came to get pt for scan but pt is not seen in the lobby

## 2021-05-02 NOTE — Assessment & Plan Note (Signed)
Stable

## 2021-05-02 NOTE — H&P (Signed)
History and Physical    [name]:[id number] DOB: Dec 26, 1931 DOA: 05/01/2021  PCP: [name], MD   Patient coming from: Home  I have personally briefly reviewed patient's old medical records in [place]  CC: altered mental status x 4 days HPI: 87 year old male with a history of complete heart block status post multiple pacemaker changes last 1 in November 2020, coronary disease, diabetes type 2 on chronic insulin, hypertension, CKD stage III baseline creatinine approximately 2.0 presents to the ER today with a 4-day history of altered mental status.  Patient had some word finding.  No focal weakness.  It was the patient actually noticed that he was having trouble with word finding.  Wife confirms this.  Patient's had some chills but no documented fever.  He is checked his temperature multiple times with different thermometers.  He states that already different thermometers have given him different readings.  Patient states that he has had a different sensation when he urinates.  He denies actually any pain or burning with urination but feels like there is a different type of tingling when he urinates.  No back pain.  On presentation the ER today, patient is noted to be hypoglycemic with a blood sugar of 59.  This continued despite getting some thing to eat.  Patient does state that he has been eating less but still taking the same amount insulin.  Laboratory evaluation serum creatinine increased to 2.5 from baseline 2.0.  Serum glucose of 59.  COVID-negative.  Flu negative.  White count of 11.  CT head demonstrated extensive chronic microvascular ischemic changes.  Chronic right maxillary sinus disease.  Chest x-ray demonstrates low lung volumes.  Right-sided chest pacemaker  EKG demonstrates paced ventricular rhythm.  Per my conversation with the EDP, patient's pacemaker was interrogated with no abnormal arrhythmias.  Due to patient's altered mental status,  [facility] hospitalist contacted for admission.   ED Course: no fevers on admission. Hypoglycemic. CT head negative for acute stroke. EKG paced rhythm.  Review of Systems:  Review of Systems  Constitutional:  Positive for chills. Negative for fever, malaise/fatigue and weight loss.       +anorexia  HENT: Negative.    Eyes: Negative.   Respiratory: Negative.    Cardiovascular: Negative.   Gastrointestinal:  Positive for abdominal pain.       Had abd pain when he went off pepcid several weeks ago, but has resolved after he got a new Rx for pepcid  Genitourinary:        Different sensation when he urinates. But denies any pain with urination.  Musculoskeletal: Negative.   Skin: Negative.   Neurological:  Negative for dizziness and headaches.       Pt aware that he is confused and trying to find the correct words to say. Denies any focal weakness.  Endo/Heme/Allergies: Negative.   Psychiatric/Behavioral: Negative.    All other systems reviewed and are negative.  Past Medical History:  Diagnosis Date   Abnormal Doppler ultrasound of carotid artery 12/16/2012   mildly abnormal doppler . no prior studies to compare to. follow up studies are recommended when clinically indicated.   Acute respiratory failure with hypoxia (HCC) 11/2018   Blepharitis    Cancer (HCC)    Carotid stenosis, left 11/2018   on left 60%   Diabetes mellitus without complication (HCC)    Exogenous obesity    Gastric ulcer    GI bleed    History of stress test 06/06/2012  low risk scan. fixed inferior defect consistant with prior infarction with moderatly reduced EF. no appreciable evidence of ischemia. no significant change from previous study   Hx of echocardiogram 08-15-2012   EF  45-50 % very difficult study, severe LVH, pacemaker, trace TR,mildly enlarged RV,; aorta mildly calcified   Pacemaker 2011   [employer] Accent DDDR ; this is the third generator this pt. has had   Shingles    TIA (transient ischemic  attack) 11/2018   vs stroke not seen on CT   UTI (lower urinary tract infection)     Past Surgical History:  Procedure Laterality Date   CARDIAC CATHETERIZATION  12-29-2003   rt. coronary angiography was done with a 6 french 4 cm taper coronary cath. This demonstrated  approximately 70 to 75% mildly segmental stenosis in the mid RCA before the acute margin           CARDIAC CATHETERIZATION  11-19-2003   diagnostic cath,, pt. to get second cath to have stent placed   cardiac stents     CATARACT EXTRACTION     KNEE SURGERY Right    PPM GENERATOR CHANGEOUT N/A 04/07/2019   Procedure: PPM GENERATOR CHANGEOUT;  Surgeon: Sanda Klein, MD;  Location: [facility];  Service: Cardiovascular;  Laterality: N/A;   PROSTATE BIOPSY     prostate seed implants     SPINE SURGERY     WISDOM TOOTH EXTRACTION       reports that he has never smoked. He has quit using smokeless tobacco. He reports current alcohol use. He reports that he does not use drugs.  No Known Allergies  Family History  Problem Relation Age of Onset   Heart disease Father     Prior to Admission medications   Medication Sig Start Date End Date Taking? Authorizing Provider  acetaminophen (TYLENOL) 500 MG tablet Take 500 mg by mouth 2 (two) times daily.   Yes [provider]  aspirin 81 MG tablet Take 81 mg by mouth every evening.    Yes [provider]  irbesartan (AVAPRO) 150 MG tablet Take 1 tablet by mouth once daily Patient taking differently: Take 150 mg by mouth daily. 09/13/20  Yes Croitoru, Mihai, MD  levothyroxine (SYNTHROID, LEVOTHROID) 25 MCG tablet Take 25 mcg by mouth daily.  02/09/14  Yes [provider]  metoprolol succinate (TOPROL-XL) 25 MG 24 hr tablet Take 1 tablet (25 mg total) by mouth daily. Patient taking differently: Take 25 mg by mouth every evening. 04/22/20  Yes Croitoru, Mihai, MD  nitroGLYCERIN (NITROSTAT) 0.4 MG SL tablet Place 1 tablet (0.4 mg total) under the tongue  every 5 (five) minutes as needed for chest pain. 12/27/18  Yes Croitoru, Mihai, MD  rosuvastatin (CRESTOR) 40 MG tablet Take 1 tablet by mouth once daily Patient taking differently: Take 40 mg by mouth every evening. 04/18/21  Yes Croitoru, Mihai, MD  BD VEO INSULIN SYRINGE U/F 31G X 15/64" 1 ML MISC USE TO ADMINSTER INSULIN SYRINGE TWICE DAILY 04/01/19   [provider]  calcitRIOL (ROCALTROL) 0.25 MCG capsule Take 0.25 mcg by mouth every Monday, Wednesday, and Friday. evening    [provider]  cephALEXin (KEFLEX) 500 MG capsule TAKE 1 CAPSULE BY MOUTH 4 TIMES DAILY FOR 10 DAYS 02/20/19   [provider]  clopidogrel (PLAVIX) 75 MG tablet Take 1 tablet by mouth once daily 04/22/21   Croitoru, Mihai, MD  docusate sodium (COLACE) 100 MG capsule Take 100 mg by mouth  2 (two) times daily.     [provider]  Emollient (UDDERLY SMOOTH EX) Apply 1 application topically daily.    [provider]  famotidine (PEPCID) 40 MG tablet Take 40 mg by mouth at bedtime. 03/21/19   [provider]  furosemide (LASIX) 20 MG tablet TAKE 1 TABLET BY MOUTH ONCE DAILY IN THE EVENING 09/20/20   Croitoru, Mihai, MD  insulin NPH-regular Human (70-30) 100 UNIT/ML injection Inject 55-65 Units into the skin 2 (two) times daily with a meal.     [provider]  mupirocin ointment (BACTROBAN) 2 % Apply topically 2 (two) times daily. 10/27/19   [provider]  Omega-3 Fatty Acids (FISH OIL) 1200 MG CAPS Take 2,400 mg by mouth every evening.    [provider]  [facility] ULTRA test strip USE TWICE DAILY TO CHECK GLUCOSE PRIOR TO INSULIN USE 05/10/19   [provider]  polyethylene glycol powder (GLYCOLAX/MIRALAX) 17 GM/SCOOP powder Take by mouth.    [provider]  predniSONE (DELTASONE) 20 MG tablet Take 20 mg by mouth every morning. 10/18/19   [provider]    Physical Exam: Vitals:   05/02/21 0345 05/02/21 0415 05/02/21  0430 05/02/21 0445  BP: (!) 165/80 (!) 158/97 (!) 166/114 (!) 156/75  Pulse: 65 64 67 67  Resp: 14 16 15  (!) 21  Temp:      TempSrc:      SpO2: 100% 99% 100% 100%    Physical Exam Vitals and nursing note reviewed.  Constitutional:      General: He is not in acute distress.    Appearance: He is obese. He is not ill-appearing, toxic-appearing or diaphoretic.  HENT:     Head: Normocephalic and atraumatic.     Nose: Nose normal. No rhinorrhea.  Eyes:     General:        Right eye: No discharge.        Left eye: No discharge.  Cardiovascular:     Rate and Rhythm: Normal rate and regular rhythm.     Pulses: Normal pulses.  Pulmonary:     Effort: Pulmonary effort is normal. No respiratory distress.     Breath sounds: Normal breath sounds. No wheezing or rales.  Abdominal:     General: Abdomen is protuberant. Bowel sounds are normal. There is no distension.     Palpations: Abdomen is soft.     Tenderness: There is no abdominal tenderness. There is no guarding or rebound.  Musculoskeletal:        General: Normal range of motion.     Right lower leg: No edema.     Left lower leg: No edema.  Skin:    General: Skin is warm and dry.     Capillary Refill: Capillary refill takes less than 2 seconds.  Neurological:     General: No focal deficit present.     Mental Status: He is alert and oriented to person, place, and time.     Motor: Motor function is intact. No weakness.     Comments: Normal muscle tone/bulk bilateral biceps, hand grips  Normal dorsi and plantar flexion.  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 Unchanged.     Labs on Admission: I have personally reviewed following labs and imaging studies  CBC: Recent Labs  Lab 05/02/21 0002 05/02/21 0102  WBC 11.0*  --   NEUTROABS 7.5  --   HGB 14.9 16.3  HCT 46.9 48.0  MCV 94.6  --  PLT 159  --    Basic Metabolic Panel: Recent Labs  Lab 05/02/21 0002 05/02/21 0102  NA 133* 134*  K 4.2 4.3  CL 99 103  CO2  24  --   GLUCOSE 59* 61*  BUN 55* 56*  CREATININE 2.51* 2.50*  CALCIUM 9.1  --    GFR: CrCl cannot be calculated (Unknown ideal weight.). Liver Function Tests: Recent Labs  Lab 05/02/21 0002  AST 28  ALT 29  ALKPHOS 44  BILITOT 0.9  PROT 6.9  ALBUMIN 3.6   No results for input(s): LIPASE, AMYLASE in the last 168 hours. Recent Labs  Lab 05/02/21 0004  AMMONIA 29   Coagulation Profile: No results for input(s): INR, PROTIME in the last 168 hours. Cardiac Enzymes: No results for input(s): CKTOTAL, CKMB, CKMBINDEX, TROPONINI in the last 168 hours. BNP (last 3 results) No results for input(s): PROBNP in the last 8760 hours. HbA1C: No results for input(s): HGBA1C in the last 72 hours. CBG: Recent Labs  Lab 05/02/21 0417 05/02/21 0452  GLUCAP 59* 86   Lipid Profile: No results for input(s): CHOL, HDL, LDLCALC, TRIG, CHOLHDL, LDLDIRECT in the last 72 hours. Thyroid Function Tests: No results for input(s): TSH, T4TOTAL, FREET4, T3FREE, THYROIDAB in the last 72 hours. Anemia Panel: No results for input(s): VITAMINB12, FOLATE, FERRITIN, TIBC, IRON, RETICCTPCT in the last 72 hours. Urine analysis:    Component Value Date/Time   COLORURINE YELLOW 06/29/2010 0930   APPEARANCEUR CLEAR 06/29/2010 0930   LABSPEC 1.010 06/29/2010 0930   PHURINE 6.5 06/29/2010 0930   HGBUR NEGATIVE 06/29/2010 0930   BILIRUBINUR NEGATIVE 06/29/2010 0930   KETONESUR NEGATIVE 06/29/2010 0930   PROTEINUR NEGATIVE 06/29/2010 0930   UROBILINOGEN 0.2 06/29/2010 0930   NITRITE NEGATIVE 06/29/2010 0930   LEUKOCYTESUR  06/29/2010 0930    NEGATIVE MICROSCOPIC NOT DONE ON URINES WITH NEGATIVE PROTEIN, BLOOD, LEUKOCYTES, NITRITE, OR GLUCOSE <1000 mg/dL.    Radiological Exams on Admission: I have personally reviewed images DG Chest 2 View  Result Date: 05/02/2021 CLINICAL DATA:  Shortness of breath. EXAM: CHEST - 2 VIEW COMPARISON:  09/28/2009. FINDINGS: The heart enlarged and mediastinal contours  are within normal limits. Lung volumes are extremely low and there is mild atelectasis at the left lung base. No definite effusion or pneumothorax. No acute osseous abnormality. A dual lead pacemaker is present over the right chest. IMPRESSION: 1. Cardiomegaly. 2. Extremely low lung volumes with atelectasis at the left lung base. Electronically Signed   By: Brett Fairy M.D.   On: 05/02/2021 00:30   CT HEAD WO CONTRAST (5MM)  Result Date: 05/02/2021 CLINICAL DATA:  Mental status change, unknown cause. EXAM: CT HEAD WITHOUT CONTRAST TECHNIQUE: Contiguous axial images were obtained from the base of the skull through the vertex without intravenous contrast. COMPARISON:  11/26/2018. FINDINGS: Brain: No acute intracranial hemorrhage, midline shift or mass effect. No extra-axial fluid collection. Diffuse atrophy is noted. Subcortical and periventricular white matter hypodensities are noted bilaterally. There is no hydrocephalus Vascular: No hyperdense vessel or unexpected calcification. Skull: Normal. Negative for fracture or focal lesion. Sinuses/Orbits: There is mucosal thickening in the right maxillary sinus with mucoperiosteal elevation. There is mild mucosal thickening in the ethmoid air cells and frontal sinuses. Bilateral lens replacement surgery bilaterally. Other: None. IMPRESSION: 1. No acute intracranial process. 2. Atrophy with extensive chronic microvascular ischemic changes. 3. Chronic right maxillary sinus disease. Electronically Signed   By: Brett Fairy M.D.   On: 05/02/2021 00:53    EKG:  I have personally reviewed EKG: paced rhythm   Assessment/Plan Principal Problem:   Altered mental status Active Problems:   Diabetic hypoglycemia [facility])   Pacemaker - Assurity MRI compatible. placed 03-2019   CAD (coronary artery disease)   CKD (chronic kidney disease) stage 3, GFR 30-59 ml/min (HCC)   DM type 2 causing CKD stage 3 (HCC)   Morbid obesity (HCC)    Altered mental status Observation  medical bed. MRI brain without contrast due to underlying CKD.  Patient symptoms for the last 4 days.  Outside tPA window.  Patient is actually alert and orient x3.  Has no focal weakness.  He only notices confusion but some word finding.  Altered mental status could also be due to diabetic hyperglycemia.  Patient describes a different sensation when he urinates.  Denies any pain with urination or burning with urination.  Still awaiting UA.  Patient has had chills but no documented fever.  Holding off on antibiotics until UA is obtained.  Diabetic hypoglycemia ([place]) Patient still taking his insulin despite having decreased oral intake.  Had several blood sugars less than 60.  Could explain his altered mental status.  Pacemaker - Assurity MRI compatible. placed 03-2019 Chronic.  CAD (coronary artery disease) Stable.  CKD (chronic kidney disease) stage 3, GFR 30-59 ml/min (HCC) Creatinine slightly worse than baseline.  Holding ARB.  Gentle IV hydration.  DM type 2 causing CKD stage 3 ([place]) Holding off on insulin due to diabetic hypoglycemia.  Morbid obesity ([place]) Chronic  DVT prophylaxis: SQ Heparin Code Status: Full Code Family Communication: discussed with pt and wife lynn at bedside  Disposition Plan: return home  Consults called: none  Admission status: Observation, Med-Surg   Kristopher Oppenheim, DO [facility] 05/02/2021, 6:01 AM

## 2021-05-02 NOTE — ED Notes (Signed)
Pt was able to ambulate to the restroom using a walker  

## 2021-05-02 NOTE — Subjective & Objective (Signed)
CC: altered mental status x 4 days HPI: 87 year old male with a history of complete heart block status post multiple pacemaker changes last 1 in November 2020, coronary disease, diabetes type 2 on chronic insulin, hypertension, CKD stage III baseline creatinine approximately 2.0 presents to the ER today with a 4-day history of altered mental status.  Patient had some word finding.  No focal weakness.  It was the patient actually noticed that he was having trouble with word finding.  Wife confirms this.  Patient's had some chills but no documented fever.  He is checked his temperature multiple times with different thermometers.  He states that already different thermometers have given him different readings.  Patient states that he has had a different sensation when he urinates.  He denies actually any pain or burning with urination but feels like there is a different type of tingling when he urinates.  No back pain.  On presentation the ER today, patient is noted to be hypoglycemic with a blood sugar of 59.  This continued despite getting some thing to eat.  Patient does state that he has been eating less but still taking the same amount insulin.  Laboratory evaluation serum creatinine increased to 2.5 from baseline 2.0.  Serum glucose of 59.  COVID-negative.  Flu negative.  White count of 11.  CT head demonstrated extensive chronic microvascular ischemic changes.  Chronic right maxillary sinus disease.  Chest x-ray demonstrates low lung volumes.  Right-sided chest pacemaker  EKG demonstrates paced ventricular rhythm.  Per my conversation with the EDP, patient's pacemaker was interrogated with no abnormal arrhythmias.  Due to patient's altered mental status, [facility] hospitalist contacted for admission.

## 2021-05-02 NOTE — Discharge Summary (Signed)
Physician Discharge Summary  [name] WEX:[id number] DOB: 05/27/1931 DOA: 05/01/2021  PCP: [name], MD  Admit date: 05/01/2021 Discharge date: 05/02/2021 Admitted From: Home Disposition: Home Recommendations for Outpatient Follow-up:  Follow ups as below. Please obtain CBC/BMP/Mag at follow up Recommend ambulatory referral to outpatient rehab Please follow up on the following pending results: None Home Health: None Equipment/Devices: None Discharge Condition: Stable CODE STATUS: Full code  Follow-up Information     [name], MD. Schedule an appointment as soon as possible for a visit in 1 week(s).   Specialty: Family Medicine Contact information: [place] [address] [phone]                Hospital Course: 87 year old M with PMH of CHB/PPM, CAD, IDDM-2 with neuropathy, CKD-3B, HTN, left carotid artery stenosis presenting with altered mental status for 4 days and word finding difficulty and admitted for CVA work-up.  Also report of DOE for 1 week per Triage note. CT head without acute finding but chronic microvascular changes. Cr 2.5 (2.0 in 2020).  BUN 56.  Serum glucose 59.  CXR without acute finding.  UA, UDS, COVID-19 and influenza PCR negative.  Initially, pacemaker thought to be compatible with MRI, and MRI brain was ordered which seems to be not the case per radiologist staff and and cardiologist for implanted the pacemaker.  Unfortunately, not able to obtain CTA with contrast due to poor renal function.  Carotid ultrasound with 40 to 59% left ICA stenosis.  Echocardiogram with LVEF of 40 to 45%, grossly similar to TTE in 2020.    On the day of discharge, patient's symptoms resolved. Patient is already on Plavix, aspirin and high intensity statin.  Therapy recommended outpatient follow-up.  See individual problem list below for more on hospital course.  Discharge Diagnoses:  Altered mental status/word  finding difficulty: Unclear etiology of this but seems to have resolved.  Doubt acute CVA.  Could be due to hypoglycemia but not severe enough.  His CBG was 59 on arrival.  UA and UDS negative.  Low suspicion for infectious process.  Not on sedating medication.  Neuro exam reassuring. -Continue statin, Plavix and aspirin -Decreased home insulin from 60 to 50 units twice daily -Recommend ambulatory referral to outpatient therapy   History of CVA/carotid artery stenosis: CT head with chronic microvascular changes.  Carotid US as above. -Management as above   Dyspnea on exertion: Unclear etiology.  Does not appear fluid overloaded.  Low suspicion for infectious process.  No history of asthma or COPD.  TTE unchanged   Uncontrolled IDDM-2 with hypoglycemia, CKD-3B and neuropathy: A1c 7.1%.  Hypoglycemia resolved. Recent Labs  Lab 05/02/21 0417 05/02/21 0452 05/02/21 0720 05/02/21 1219 05/02/21 1711  GLUCAP 59* 86 129* 117* 151*  -Decreased home insulin from 60 units to 50 units twice daily -Follow-up with primary care doctor in 1 to 2 weeks   History of complete heart block s/p PPM: Per EDP, interrogation of pacemaker shows no episodes of high ventricular rate.  99% V paced.   Elevated troponin: Mild.  Likely delayed clearance from renal failure.  Low suspicion for ACS. CAD (coronary artery disease): No chest pain. -Continue home meds    CKD-3B/possible CKD-4: Cr 2.0 in 2020.  No interval value until this admission.  Could be his new baseline. Recent Labs    05/02/21 0002 05/02/21 0102 05/02/21 0412  BUN 55* 56* 54*  CREATININE 2.51* 2.50* 2.49*  -Recommended holding ARB  and Lasix for 4 days -Recheck renal function at follow-up -Patient is followed by Dr. Justin Mend at [facility]   Physical deconditioning -Therapy recommended outpatient PT.   Morbid obesity: BMI over 40. -Encourage lifestyle change to lose weight.           Discharge Exam: Vitals:   05/02/21 1300  05/02/21 1330 05/02/21 1400 05/02/21 1748  BP: 107/79  131/88 124/82  Pulse: 77  95 90  Temp:    98 F (36.7 C)  Resp: (!) 23  (!) 24 19  SpO2:  92% 93% 95%  TempSrc:    Oral     GENERAL: No apparent distress.  Nontoxic. HEENT: MMM.  Vision and hearing grossly intact.  NECK: Supple.  No apparent JVD.  RESP: 95% on RA.  No IWOB.  Fair aeration bilaterally. CVS:  RRR. Heart sounds normal.  ABD/GI/GU: Bowel sounds present. Soft. Non tender.  MSK/EXT:  Moves extremities. No apparent deformity. No edema.  SKIN: no apparent skin lesion or wound NEURO: Awake, alert and oriented appropriately. Speech clear. Cranial nerves II-XII intact. Motor 5/5 in all muscle groups of UE and LE bilaterally, Normal tone. Light sensation intact in all dermatomes of upper and lower ext bilaterally. Patellar reflex symmetric.  No pronator drift.  Finger to nose intact. PSYCH: Calm. Normal affect.   Discharge Instructions  Discharge Instructions     Diet - low sodium heart healthy   Complete by: As directed    Diet Carb Modified   Complete by: As directed    Discharge instructions   Complete by: As directed    It has been a pleasure taking care of you!  You were hospitalized with altered mental status and word finding difficulty.  It is not quite clear what caused the symptoms but could be due to low blood sugar.  CT of your head did not show acute stroke.  Ultrasound of your neck did not show further narrowing of your carotid artery.  Unfortunately, we were not able to do MRI of your brain due to your pacemaker.  Regardless, you are already on Plavix, aspirin and Crestor to prevent stroke.  Continue taking those medications.  We have decreased your insulin to 50 units twice daily.  We also recommend holding your Avapro and Lasix for 4 more days.  Please follow-up with your primary care doctor either this week or early next week to have your kidney function rechecked.  You may need to see nephrology in the near  future if you have been established care with them yet.  Call 911 if you notice further difficulty speaking, facial droop, new weakness or numbness in your arms or legs.   Take care,   Increase activity slowly   Complete by: As directed       Allergies as of 05/02/2021   No Known Allergies      Medication List     TAKE these medications    acetaminophen 500 MG tablet Commonly known as: TYLENOL Take 500 mg by mouth 2 (two) times daily.   aspirin 81 MG tablet Take 81 mg by mouth every evening.   BD Veo Insulin Syringe U/F 31G X 15/64" 1 ML Misc Generic drug: Insulin Syringe-Needle U-100 USE TO ADMINSTER INSULIN SYRINGE TWICE DAILY   calcitRIOL 0.25 MCG capsule Commonly known as: ROCALTROL Take 0.25 mcg by mouth every Monday, Wednesday, and Friday. evening   clopidogrel 75 MG tablet Commonly known as: PLAVIX Take 1 tablet by mouth once daily What  changed: when to take this   famotidine 20 MG tablet Commonly known as: PEPCID Take 1 tablet (20 mg total) by mouth daily. What changed:  medication strength how much to take when to take this   furosemide 20 MG tablet Commonly known as: LASIX Take 1 tablet (20 mg total) by mouth every evening. Start taking on: May 06, 2021 What changed: These instructions start on May 06, 2021. If you are unsure what to do until then, ask your doctor or other care provider.   insulin NPH-regular Human (70-30) 100 UNIT/ML injection Inject 50 Units into the skin 2 (two) times daily with a meal. What changed: how much to take   irbesartan 150 MG tablet Commonly known as: AVAPRO Take 1 tablet (150 mg total) by mouth daily. Start taking on: May 06, 2021 What changed: These instructions start on May 06, 2021. If you are unsure what to do until then, ask your doctor or other care provider.   levothyroxine 25 MCG tablet Commonly known as: SYNTHROID Take 25 mcg by mouth daily.   metoprolol succinate 25 MG 24 hr  tablet Commonly known as: TOPROL-XL Take 1 tablet (25 mg total) by mouth daily. What changed: when to take this   nitroGLYCERIN 0.4 MG SL tablet Commonly known as: NITROSTAT Place 1 tablet (0.4 mg total) under the tongue every 5 (five) minutes as needed for chest pain.   OneTouch Ultra test strip Generic drug: glucose blood USE TWICE DAILY TO CHECK GLUCOSE PRIOR TO INSULIN USE   OVER THE COUNTER MEDICATION Take 1 capsule by mouth every evening. OTC vitamin for patients with kidney issues   rosuvastatin 40 MG tablet Commonly known as: CRESTOR Take 1 tablet by mouth once daily What changed: when to take this   UDDERLY SMOOTH EX Apply 1 application topically daily. Apply to both feet        Consultations: None  Procedures/Studies: None   DG Chest 2 View  Result Date: 05/02/2021 CLINICAL DATA:  Shortness of breath. EXAM: CHEST - 2 VIEW COMPARISON:  09/28/2009. FINDINGS: The heart enlarged and mediastinal contours are within normal limits. Lung volumes are extremely low and there is mild atelectasis at the left lung base. No definite effusion or pneumothorax. No acute osseous abnormality. A dual lead pacemaker is present over the right chest. IMPRESSION: 1. Cardiomegaly. 2. Extremely low lung volumes with atelectasis at the left lung base. Electronically Signed   By: Brett Fairy M.D.   On: 05/02/2021 00:30   CT HEAD WO CONTRAST (5MM)  Result Date: 05/02/2021 CLINICAL DATA:  Mental status change, unknown cause. EXAM: CT HEAD WITHOUT CONTRAST TECHNIQUE: Contiguous axial images were obtained from the base of the skull through the vertex without intravenous contrast. COMPARISON:  11/26/2018. FINDINGS: Brain: No acute intracranial hemorrhage, midline shift or mass effect. No extra-axial fluid collection. Diffuse atrophy is noted. Subcortical and periventricular white matter hypodensities are noted bilaterally. There is no hydrocephalus Vascular: No hyperdense vessel or unexpected  calcification. Skull: Normal. Negative for fracture or focal lesion. Sinuses/Orbits: There is mucosal thickening in the right maxillary sinus with mucoperiosteal elevation. There is mild mucosal thickening in the ethmoid air cells and frontal sinuses. Bilateral lens replacement surgery bilaterally. Other: None. IMPRESSION: 1. No acute intracranial process. 2. Atrophy with extensive chronic microvascular ischemic changes. 3. Chronic right maxillary sinus disease. Electronically Signed   By: Brett Fairy M.D.   On: 05/02/2021 00:53   ECHOCARDIOGRAM COMPLETE  Result Date: 05/02/2021    ECHOCARDIOGRAM REPORT  Patient Name:   [name] Date of Exam: 05/02/2021 Medical Rec #:  [id number]      Height:       69.7 in Accession #:    [id number]     Weight:       262.8 lb Date of Birth:  1931/06/23      BSA:          2.337 m Patient Age:    78 years       BP:           112/95 mmHg Patient Gender: M              HR:           87 bpm. Exam Location:  Inpatient Procedure: 2D Echo, Cardiac Doppler, Color Doppler and Intracardiac            Opacification Agent Indications:    TIA  History:        Patient has prior history of Echocardiogram examinations, most                 recent 11/25/2018. CHF and Cardiomyopathy, CAD, Abnormal ECG and                 Pacemaker, Signs/Symptoms:Altered Mental Status; Risk                 Factors:Diabetes and Dyslipidemia.  Sonographer:    Roseanna Rainbow RDCS Referring Phys: [id number] ERIC CHEN  Sonographer Comments: Technically difficult study due to poor echo windows, Technically challenging study due to limited acoustic windows, suboptimal parasternal window, suboptimal apical window, suboptimal subcostal window and patient is morbidly obese.  Image acquisition challenging due to patient body habitus. Patient moving throughout echo, wearing restrictive clothing under gown. IMPRESSIONS  1. Left ventricular ejection fraction, by estimation, is 40 to 45%. The left ventricle has mildly decreased  function. Conclusion(s)/Recommendation(s): Very difficult windows. Prior studies have shown no great windows either. Grossly no significant changes in EF compared to study 11/25/2018. FINDINGS  Left Ventricle: Left ventricular ejection fraction, by estimation, is 40 to 45%. The left ventricle has mildly decreased function. Definity contrast agent was given IV to delineate the left ventricular endocardial borders.  LEFT VENTRICLE PLAX 2D LVIDd:         2.85 cm   Diastology LVIDs:         2.80 cm   LV e' medial:    3.65 cm/s LV PW:         1.55 cm   LV E/e' medial:  22.7 LV IVS:        1.65 cm   LV e' lateral:   9.73 cm/s LVOT diam:     2.20 cm   LV E/e' lateral: 8.5 LV SV:         66 LV SV Index:   28 LVOT Area:     3.80 cm  RIGHT VENTRICLE RV S prime:     8.27 cm/s TAPSE (M-mode): 1.8 cm LEFT ATRIUM           Index       RIGHT ATRIUM           Index LA Vol (A2C): 19.4 ml 8.30 ml/m  RA Area:     11.20 cm LA Vol (A4C): 21.9 ml 9.37 ml/m  RA Volume:   22.10 ml  9.46 ml/m  AORTIC VALVE LVOT Vmax:   114.00 cm/s LVOT Vmean:  70.400 cm/s LVOT VTI:    0.173 m  AORTA Ao Asc diam: 3.60 cm MITRAL VALVE MV Area (PHT): 5.68 cm     SHUNTS MV Decel Time: 134 msec     Systemic VTI:  0.17 m MV E velocity: 83.00 cm/s   Systemic Diam: 2.20 cm MV A velocity: 118.00 cm/s MV E/A ratio:  0.70 Phineas Inches Electronically signed by Phineas Inches Signature Date/Time: 05/02/2021/5:53:56 PM    Final    VAS US CAROTID  Result Date: 05/02/2021 Carotid Arterial Duplex Study Patient Name:  [name]  Date of Exam:   05/02/2021 Medical Rec #: [id number]       Accession #:    [id number] Date of Birth: 26-Oct-1931       Patient Gender: M Patient Age:   18 years Exam Location[facility]  [facility] Procedure:      VAS US CAROTID Referring Phys: Bretta Bang Jahsiah Carpenter --------------------------------------------------------------------------------  Indications:       CVA. Risk Factors:      Diabetes, past history of smoking, coronary artery disease.  Comparison Study:  11-26-2019 Most recent carotid duplex bilateral showed 1-39%                    ICA stenosis bilaterally.                     11-24-2018 Prior CTA neck showed evidence of 60% LT ICA                    stenosis. Performing Technologist: Darlin Coco RDMS, RVT  Examination Guidelines: A complete evaluation includes B-mode imaging, spectral Doppler, color Doppler, and power Doppler as needed of all accessible portions of each vessel. Bilateral testing is considered an integral part of a complete examination. Limited examinations for reoccurring indications may be performed as noted.  Right Carotid Findings: +----------+--------+--------+--------+-------------------------+--------+           PSV cm/sEDV cm/sStenosisPlaque Description       Comments +----------+--------+--------+--------+-------------------------+--------+ CCA Prox  86      14                                                +----------+--------+--------+--------+-------------------------+--------+ CCA Distal62      13                                                +----------+--------+--------+--------+-------------------------+--------+ ICA Prox  93      22      1-39%   heterogenous and calcific         +----------+--------+--------+--------+-------------------------+--------+ ICA Distal91      26                                       tortuous +----------+--------+--------+--------+-------------------------+--------+ ECA       93      18                                                +----------+--------+--------+--------+-------------------------+--------+ +----------+--------+-------+----------------+-------------------+  PSV cm/sEDV cmsDescribe        Arm Pressure (mmHG) +----------+--------+-------+----------------+-------------------+ [id number]            Multiphasic, WNL                    +----------+--------+-------+----------------+-------------------+  +---------+--------+--+--------+-+---------+ VertebralPSV cm/s40EDV cm/s9Antegrade +---------+--------+--+--------+-+---------+  Left Carotid Findings: +----------+--------+--------+--------+--------------------+-------------------+           PSV cm/sEDV cm/sStenosisPlaque Description  Comments            +----------+--------+--------+--------+--------------------+-------------------+ CCA Prox  69      10                                                      +----------+--------+--------+--------+--------------------+-------------------+ CCA Distal57      13                                                      +----------+--------+--------+--------+--------------------+-------------------+ ICA Prox  129     34      1-39%   heterogenous and    Velocities may                                        calcific            underestimate                                                             degree of stenosis                                                        due to more                                                               proximal                                                                  obstruction.        +----------+--------+--------+--------+--------------------+-------------------+ ICA Distal75      22                                  tortuous            +----------+--------+--------+--------+--------------------+-------------------+  ECA       49      1                                                       +----------+--------+--------+--------+--------------------+-------------------+ +----------+--------+--------+----------------+-------------------+           PSV cm/sEDV cm/sDescribe        Arm Pressure (mmHG) +----------+--------+--------+----------------+-------------------+ [id number]             Multiphasic, WNL                     +----------+--------+--------+----------------+-------------------+ +---------+--------+--+--------+-+---------+ VertebralPSV cm/s24EDV cm/s2Antegrade +---------+--------+--+--------+-+---------+   Summary: Right Carotid: Velocities in the right ICA are consistent with a 1-39% stenosis. Left Carotid: Velocities in the left ICA are consistent with a 1-39% stenosis.               [place]lin 40-59%. Vertebrals:  Bilateral vertebral arteries demonstrate antegrade flow. Subclavians: Normal flow hemodynamics were seen in bilateral subclavian              arteries. *See table(s) above for measurements and observations.     Preliminary        The results of significant diagnostics from this hospitalization (including imaging, microbiology, ancillary and laboratory) are listed below for reference.     Microbiology: Recent Results (from the past 240 hour(s))  Resp Panel by RT-PCR (Flu A&B, Covid) Nasopharyngeal Swab     Status: None   Collection Time: 05/02/21  3:05 AM   Specimen: Nasopharyngeal Swab; Nasopharyngeal(NP) swabs in vial transport medium  Result Value Ref Range Status   SARS Coronavirus 2 by RT PCR NEGATIVE NEGATIVE Final    Comment: (NOTE) SARS-CoV-2 target nucleic acids are NOT DETECTED.  The SARS-CoV-2 RNA is generally detectable in upper respiratory specimens during the acute phase of infection. The lowest concentration of SARS-CoV-2 viral copies this assay can detect is 138 copies/mL. A negative result does not preclude SARS-Cov-2 infection and should not be used as the sole basis for treatment or other patient management decisions. A negative result may occur with  improper specimen collection/handling, submission of specimen other than nasopharyngeal swab, presence of viral mutation(s) within the areas targeted by this assay, and inadequate number of viral copies(<138 copies/mL). A negative result must be combined with clinical observations, patient history, and  epidemiological information. The expected result is Negative.  Fact Sheet for Patients:  EntrepreneurPulse.com.au  Fact Sheet for Healthcare Providers:  IncredibleEmployment.be  This test is no t yet approved or cleared by the [place] FDA and  has been authorized for detection and/or diagnosis of SARS-CoV-2 by FDA under an Emergency Use Authorization (EUA). This EUA will remain  in effect (meaning this test can be used) for the duration of the COVID-19 declaration under Section 564(b)(1) of the Act, 21 U.S.C.section 360bbb-3(b)(1), unless the authorization is terminated  or revoked sooner.       Influenza A by PCR NEGATIVE NEGATIVE Final   Influenza B by PCR NEGATIVE NEGATIVE Final    Comment: (NOTE) The Xpert Xpress SARS-CoV-2/FLU/RSV plus assay is intended as an aid in the diagnosis of influenza from Nasopharyngeal swab specimens and should not be used as a sole basis for treatment. Nasal washings and aspirates are unacceptable for Xpert Xpress SARS-CoV-2/FLU/RSV testing.  Fact Sheet for Patients: EntrepreneurPulse.com.au  Fact Sheet  for Healthcare Providers: IncredibleEmployment.be  This test is not yet approved or cleared by the [place] and has been authorized for detection and/or diagnosis of SARS-CoV-2 by FDA under an Emergency Use Authorization (EUA). This EUA will remain in effect (meaning this test can be used) for the duration of the COVID-19 declaration under Section 564(b)(1) of the Act, 21 U.S.C. section 360bbb-3(b)(1), unless the authorization is terminated or revoked.  Performed at [facility], [place] [address], [place], [place] [address]      Labs:  CBC: Recent Labs  Lab 05/02/21 0002 05/02/21 0102  WBC 11.0*  --   NEUTROABS 7.5  --   HGB 14.9 16.3  HCT 46.9 48.0  MCV 94.6  --   PLT 159  --    BMP &GFR Recent Labs  Lab 05/02/21 0002 05/02/21 0102  05/02/21 0412  NA 133* 134* 136  K 4.2 4.3 4.6  CL 99 103 100  CO2 24  --  22  GLUCOSE 59* 61* 60*  BUN 55* 56* 54*  CREATININE 2.51* 2.50* 2.49*  CALCIUM 9.1  --  9.2  MG  --   --  2.5*  PHOS  --   --  5.1*   CrCl cannot be calculated (Unknown ideal weight.). Liver & Pancreas: Recent Labs  Lab 05/02/21 0002 05/02/21 0412  AST 28  --   ALT 29  --   ALKPHOS 44  --   BILITOT 0.9  --   PROT 6.9  --   ALBUMIN 3.6 3.7   No results for input(s): LIPASE, AMYLASE in the last 168 hours. Recent Labs  Lab 05/02/21 0004  AMMONIA 29   Diabetic: Recent Labs    05/02/21 0412  HGBA1C 7.1*   Recent Labs  Lab 05/02/21 0417 05/02/21 0452 05/02/21 0720 05/02/21 1219 05/02/21 1711  GLUCAP 59* 86 129* 117* 151*   Cardiac Enzymes: No results for input(s): CKTOTAL, CKMB, CKMBINDEX, TROPONINI in the last 168 hours. No results for input(s): PROBNP in the last 8760 hours. Coagulation Profile: No results for input(s): INR, PROTIME in the last 168 hours. Thyroid Function Tests: No results for input(s): TSH, T4TOTAL, FREET4, T3FREE, THYROIDAB in the last 72 hours. Lipid Profile: No results for input(s): CHOL, HDL, LDLCALC, TRIG, CHOLHDL, LDLDIRECT in the last 72 hours. Anemia Panel: No results for input(s): VITAMINB12, FOLATE, FERRITIN, TIBC, IRON, RETICCTPCT in the last 72 hours. Urine analysis:    Component Value Date/Time   COLORURINE YELLOW 05/02/2021 [place] 05/02/2021 0531   LABSPEC 1.013 05/02/2021 0531   PHURINE 5.0 05/02/2021 0531   GLUCOSEU 50 (A) 05/02/2021 0531   HGBUR NEGATIVE 05/02/2021 [place] 05/02/2021 [place] 05/02/2021 0531   PROTEINUR NEGATIVE 05/02/2021 0531   UROBILINOGEN 0.2 06/29/2010 0930   NITRITE NEGATIVE 05/02/2021 0531   LEUKOCYTESUR NEGATIVE 05/02/2021 0531   Sepsis Labs: Invalid input(s): PROCALCITONIN, LACTICIDVEN   Time coordinating discharge: 45 minutes  SIGNED:  Mercy Riding,  MD  [facility] 05/02/2021, 10:34 PM

## 2021-05-02 NOTE — Evaluation (Signed)
Physical Therapy Evaluation Patient Details Name: [name] MRN: [id number] DOB: 1932-04-06 Today's Date: 05/02/2021  History of Present Illness  Pt is 87 y/o M presenting with AMS and hypoglycemia. PMH includes ARF, CA, DM, pacemaker, TIA, CAD, CKD.  Clinical Impression  Pt admitted secondary to problem above with deficits below. Pt presenting with slow processing and decreased safety awareness. Required min guard A to ambulate within ED room. No overt LOB noted. Educated about using walker at d/c for increased safety. Pt reports he would prefer outpatient PT at d/c to address current deficits. Will continue to follow acutely.        Recommendations for follow up therapy are one component of a multi-disciplinary discharge planning process, led by the attending physician.  Recommendations may be updated based on patient status, additional functional criteria and insurance authorization.  Follow Up Recommendations Outpatient PT (pt requesting outpatient PT)    Assistance Recommended at Discharge Frequent or constant Supervision/Assistance  Functional Status Assessment Patient has had a recent decline in their functional status and demonstrates the ability to make significant improvements in function in a reasonable and predictable amount of time.  Equipment Recommendations  None recommended by PT    Recommendations for Other Services       Precautions / Restrictions Precautions Precautions: Fall Precaution Comments: monitor vitals Restrictions Weight Bearing Restrictions: No      Mobility  Bed Mobility Overal bed mobility: Needs Assistance Bed Mobility: Supine to Sit;Sit to Supine     Supine to sit: Min assist Sit to supine: Min guard   General bed mobility comments: Sitting EOB    Transfers Overall transfer level: Needs assistance Equipment used: None Transfers: Sit to/from Stand Sit to Stand: Min guard           General transfer comment: Min guard for  safety    Ambulation/Gait Ambulation/Gait assistance: Min guard Gait Distance (Feet): 15 Feet Assistive device: None Gait Pattern/deviations: Step-through pattern;Decreased stride length Gait velocity: Decreased     General Gait Details: Ambulated short distance in room. Min guard A for safety. Educated about using RW at home to increase safety.  Stairs            Wheelchair Mobility    Modified Rankin (Stroke Patients Only)       Balance Overall balance assessment: Needs assistance Sitting-balance support: Feet supported Sitting balance-Leahy Scale: Fair     Standing balance support: No upper extremity supported Standing balance-Leahy Scale: Fair                               Pertinent Vitals/Pain Pain Assessment: Faces Pain Score: 4  Faces Pain Scale: Hurts little more Pain Location: low back Pain Descriptors / Indicators: Aching;Sore Pain Intervention(s): Limited activity within patient's tolerance;Monitored during session;Repositioned    Home Living Family/patient expects to be discharged to:: Private residence Living Arrangements: Spouse/significant other;Children Available Help at Discharge: Family;Available 24 hours/day Type of Home: House Home Access: Level entry     Alternate Level Stairs-Number of Steps: 16 Home Layout: Multi-level;Able to live on main level with bedroom/bathroom Home Equipment: Cane - single point;Rollator (4 wheels);Shower seat;Grab bars - tub/shower;Grab bars - toilet      Prior Function Prior Level of Function : Independent/Modified Independent;Driving                     Hand Dominance   Dominant Hand: Right    Extremity/Trunk Assessment  Upper Extremity Assessment Upper Extremity Assessment: Defer to OT evaluation    Lower Extremity Assessment Lower Extremity Assessment: Generalized weakness    Cervical / Trunk Assessment Cervical / Trunk Assessment: Kyphotic  Communication    Communication: No difficulties  Cognition Arousal/Alertness: Awake/alert Behavior During Therapy: WFL for tasks assessed/performed Overall Cognitive Status: Impaired/Different from baseline Area of Impairment: Problem solving;Safety/judgement                         Safety/Judgement: Decreased awareness of deficits;Decreased awareness of safety   Problem Solving: Slow processing General Comments: Decreased safety awareness and slow processing noted.        General Comments General comments (skin integrity, edema, etc.): SpO2 90s on 2L. BP 121/110 in sitting, 112/95 in standing.    Exercises     Assessment/Plan    PT Assessment Patient needs continued PT services  PT Problem List Decreased strength;Decreased activity tolerance;Decreased balance;Decreased mobility;Decreased knowledge of use of DME;Decreased cognition;Decreased safety awareness;Decreased knowledge of precautions       PT Treatment Interventions Gait training;DME instruction;Functional mobility training;Stair training;Therapeutic activities;Therapeutic exercise;Balance training;Patient/family education;Cognitive remediation    PT Goals (Current goals can be found in the Care Plan section)  Acute Rehab PT Goals Patient Stated Goal: to go home PT Goal Formulation: With patient Time For Goal Achievement: 05/16/21 Potential to Achieve Goals: Good    Frequency Min 3X/week   Barriers to discharge        Co-evaluation               AM-PAC PT "6 Clicks" Mobility  Outcome Measure Help needed turning from your back to your side while in a flat bed without using bedrails?: A Little Help needed moving from lying on your back to sitting on the side of a flat bed without using bedrails?: A Little Help needed moving to and from a bed to a chair (including a wheelchair)?: A Little Help needed standing up from a chair using your arms (e.g., wheelchair or bedside chair)?: A Little Help needed to walk in  hospital room?: A Little Help needed climbing 3-5 steps with a railing? : A Little 6 Click Score: 18    End of Session Equipment Utilized During Treatment: Gait belt Activity Tolerance: Patient tolerated treatment well Patient left: in bed;with call bell/phone within reach;with family/visitor present (sitting EOB) Nurse Communication: Mobility status PT Visit Diagnosis: Unsteadiness on feet (R26.81);Muscle weakness (generalized) (M62.81)    Time: 6269-4854 PT Time Calculation (min) (ACUTE ONLY): 14 min   Charges:   PT Evaluation $PT Eval Moderate Complexity: 1 Mod          Reuel Derby, PT, DPT  Acute Rehabilitation Services  Pager: [phone] Office: [phone]   Rudean Hitt 05/02/2021, 12:27 PM

## 2021-05-02 NOTE — Evaluation (Signed)
Occupational Therapy Evaluation Patient Details Name: [name] MRN: [id number] DOB: 07/24/1931 Today's Date: 05/02/2021   History of Present Illness Pt is 87 y/o M presenting with AMS and hypoglycemia. PMH includes ARF, CA, DM, pacemaker, TIA, CAD, CKD.   Clinical Impression   Pt presents with decreased balance and activity tolerance. Currently requiring supervision - Min guard for ADLs and functional transfers. Pt reports he is modified independent at baseline and lives with wife and children who are available to provide assistance at home as needed. Will follow acutely to maximize safety/independence with ADLs and functional transfers/mobility. Do not anticipate pt will need further OT services after d/c.      Recommendations for follow up therapy are one component of a multi-disciplinary discharge planning process, led by the attending physician.  Recommendations may be updated based on patient status, additional functional criteria and insurance authorization.   Follow Up Recommendations  No OT follow up    Assistance Recommended at Discharge PRN  Functional Status Assessment  Patient has had a recent decline in their functional status and demonstrates the ability to make significant improvements in function in a reasonable and predictable amount of time.  Equipment Recommendations  None recommended by OT    Recommendations for Other Services       Precautions / Restrictions Precautions Precautions: Fall;Other (comment) Precaution Comments: monitor vitals Restrictions Weight Bearing Restrictions: No      Mobility Bed Mobility Overal bed mobility: Needs Assistance Bed Mobility: Supine to Sit;Sit to Supine     Supine to sit: Min assist Sit to supine: Min guard        Transfers Overall transfer level: Needs assistance Equipment used: Rolling walker (2 wheels) Transfers: Sit to/from Stand Sit to Stand: Min guard                  Balance Overall  balance assessment: Needs assistance Sitting-balance support: Feet supported Sitting balance-Leahy Scale: Fair     Standing balance support: Bilateral upper extremity supported;No upper extremity supported Standing balance-Leahy Scale: Fair                             ADL either performed or assessed with clinical judgement   ADL Overall ADL's : Needs assistance/impaired Eating/Feeding: Independent   Grooming: Supervision/safety;Standing   Upper Body Bathing: Supervision/ safety;Sitting   Lower Body Bathing: Min guard;Sit to/from stand   Upper Body Dressing : Supervision/safety   Lower Body Dressing: Min guard;Sit to/from stand   Toilet Transfer: Min guard;Ambulation;BSC/3in1;Rolling walker (2 wheels)   Toileting- Clothing Manipulation and Hygiene: Supervision/safety       Functional mobility during ADLs: Min guard;Rolling walker (2 wheels)       Vision Patient Visual Report: No change from baseline       Perception     Praxis      Pertinent Vitals/Pain Pain Assessment: 0-10 Pain Score: 4  Pain Location: lower back Pain Descriptors / Indicators: Aching;Sore Pain Intervention(s): Monitored during session     Hand Dominance Right   Extremity/Trunk Assessment Upper Extremity Assessment Upper Extremity Assessment: Overall WFL for tasks assessed   Lower Extremity Assessment Lower Extremity Assessment: Defer to PT evaluation   Cervical / Trunk Assessment Cervical / Trunk Assessment: Kyphotic   Communication Communication Communication: No difficulties   Cognition Arousal/Alertness: Awake/alert Behavior During Therapy: WFL for tasks assessed/performed Overall Cognitive Status: Within Functional Limits for tasks assessed  General Comments  SpO2 90s on 2L. BP 121/110 in sitting, 112/95 in standing.    Exercises     Shoulder Instructions      Home Living Family/patient expects to be  discharged to:: Private residence Living Arrangements: Spouse/significant other;Children Available Help at Discharge: Family;Available 24 hours/day Type of Home: House Home Access: Level entry     Home Layout: Multi-level;Able to live on main level with bedroom/bathroom Alternate Level Stairs-Number of Steps: 16 Alternate Level Stairs-Rails: Can reach both Bathroom Shower/Tub: Occupational psychologist: Standard Bathroom Accessibility: Yes How Accessible: Accessible via walker Home Equipment: [place] - single point;Rollator (4 wheels);Shower seat;Grab bars - tub/shower;Grab bars - toilet          Prior Functioning/Environment Prior Level of Function : Independent/Modified Independent;Driving                        OT Problem List: Decreased activity tolerance;Impaired balance (sitting and/or standing);Decreased safety awareness;Decreased knowledge of precautions;Cardiopulmonary status limiting activity      OT Treatment/Interventions: Self-care/ADL training;Therapeutic exercise;Energy conservation;Therapeutic activities;Patient/family education;Balance training    OT Goals(Current goals can be found in the care plan section) Acute Rehab OT Goals Patient Stated Goal: return home OT Goal Formulation: With patient Time For Goal Achievement: 05/16/21 Potential to Achieve Goals: Good  OT Frequency: Min 2X/week   Barriers to D/C:            Co-evaluation              AM-PAC OT "6 Clicks" Daily Activity     Outcome Measure Help from another person eating meals?: None Help from another person taking care of personal grooming?: A Little Help from another person toileting, which includes using toliet, bedpan, or urinal?: A Little Help from another person bathing (including washing, rinsing, drying)?: A Little Help from another person to put on and taking off regular upper body clothing?: A Little Help from another person to put on and taking off regular  lower body clothing?: A Little 6 Click Score: 19   End of Session Equipment Utilized During Treatment: Rolling walker (2 wheels) Nurse Communication: Mobility status  Activity Tolerance: Patient tolerated treatment well;Patient limited by fatigue Patient left: in bed;with call bell/phone within reach;with family/visitor present  OT Visit Diagnosis: Unsteadiness on feet (R26.81)                Time: 3716-9678 OT Time Calculation (min): 29 min Charges:  OT General Charges $OT Visit: 1 Visit OT Evaluation $OT Eval Low Complexity: 1 Low OT Treatments $Therapeutic Activity: 8-22 mins  Dennis Hegeman C, OT/L  Acute Rehab [place] 05/02/2021, 10:54 AM

## 2021-05-02 NOTE — Progress Notes (Signed)
Carotid duplex bilateral study completed.   Please see CV Proc for preliminary results.   Leo Weyandt, RDMS, RVT  

## 2021-05-02 NOTE — Assessment & Plan Note (Signed)
Chronic. 

## 2021-05-02 NOTE — Assessment & Plan Note (Signed)
Creatinine slightly worse than baseline.  Holding ARB.  Gentle IV hydration.

## 2021-05-09 DIAGNOSIS — K5900 Constipation, unspecified: Secondary | ICD-10-CM | POA: Diagnosis not present

## 2021-05-09 DIAGNOSIS — E1142 Type 2 diabetes mellitus with diabetic polyneuropathy: Secondary | ICD-10-CM | POA: Diagnosis not present

## 2021-05-09 DIAGNOSIS — I959 Hypotension, unspecified: Secondary | ICD-10-CM | POA: Diagnosis not present

## 2021-05-15 ENCOUNTER — Other Ambulatory Visit: Payer: Self-pay | Admitting: Cardiovascular Disease

## 2021-06-14 ENCOUNTER — Other Ambulatory Visit: Payer: Self-pay | Admitting: Cardiovascular Disease

## 2021-06-16 DIAGNOSIS — E1142 Type 2 diabetes mellitus with diabetic polyneuropathy: Secondary | ICD-10-CM | POA: Diagnosis not present

## 2021-06-16 DIAGNOSIS — Z794 Long term (current) use of insulin: Secondary | ICD-10-CM | POA: Diagnosis not present

## 2021-06-16 DIAGNOSIS — E039 Hypothyroidism, unspecified: Secondary | ICD-10-CM | POA: Diagnosis not present

## 2021-06-16 DIAGNOSIS — N184 Chronic kidney disease, stage 4 (severe): Secondary | ICD-10-CM | POA: Diagnosis not present

## 2021-06-16 DIAGNOSIS — K5900 Constipation, unspecified: Secondary | ICD-10-CM | POA: Diagnosis not present

## 2021-06-21 DIAGNOSIS — E1142 Type 2 diabetes mellitus with diabetic polyneuropathy: Secondary | ICD-10-CM | POA: Diagnosis not present

## 2021-06-21 DIAGNOSIS — I5042 Chronic combined systolic (congestive) and diastolic (congestive) heart failure: Secondary | ICD-10-CM | POA: Diagnosis not present

## 2021-06-21 DIAGNOSIS — G459 Transient cerebral ischemic attack, unspecified: Secondary | ICD-10-CM | POA: Diagnosis not present

## 2021-06-21 DIAGNOSIS — I2510 Atherosclerotic heart disease of native coronary artery without angina pectoris: Secondary | ICD-10-CM | POA: Diagnosis not present

## 2021-06-21 DIAGNOSIS — E039 Hypothyroidism, unspecified: Secondary | ICD-10-CM | POA: Diagnosis not present

## 2021-06-28 ENCOUNTER — Ambulatory Visit (INDEPENDENT_AMBULATORY_CARE_PROVIDER_SITE_OTHER): Payer: Medicare Other

## 2021-06-28 DIAGNOSIS — I495 Sick sinus syndrome: Secondary | ICD-10-CM

## 2021-06-28 LAB — CUP PACEART REMOTE DEVICE CHECK
Battery Remaining Longevity: 93 mo
Battery Remaining Percentage: 79 %
Battery Voltage: 3.01 V
Brady Statistic AP VP Percent: 18 %
Brady Statistic AP VS Percent: 1 %
Brady Statistic AS VP Percent: 82 %
Brady Statistic AS VS Percent: 1 %
Brady Statistic RA Percent Paced: 17 %
Brady Statistic RV Percent Paced: 99 %
Date Time Interrogation Session: 20230207040017
Implantable Lead Implant Date: 19901207
Implantable Lead Implant Date: 19901207
Implantable Lead Location: 753859
Implantable Lead Location: 753860
Implantable Pulse Generator Implant Date: 20201116
Lead Channel Impedance Value: 290 Ohm
Lead Channel Impedance Value: 540 Ohm
Lead Channel Pacing Threshold Amplitude: 0.875 V
Lead Channel Pacing Threshold Amplitude: 1.375 V
Lead Channel Pacing Threshold Pulse Width: 0.4 ms
Lead Channel Pacing Threshold Pulse Width: 0.4 ms
Lead Channel Sensing Intrinsic Amplitude: 12 mV
Lead Channel Sensing Intrinsic Amplitude: 4.1 mV
Lead Channel Setting Pacing Amplitude: 1.625
Lead Channel Setting Pacing Amplitude: 2 V
Lead Channel Setting Pacing Pulse Width: 0.4 ms
Lead Channel Setting Sensing Sensitivity: 3 mV
Pulse Gen Model: 2272
Pulse Gen Serial Number: 9177614

## 2021-07-01 NOTE — Progress Notes (Signed)
Remote pacemaker transmission.   

## 2021-07-06 ENCOUNTER — Encounter: Payer: Self-pay | Admitting: Podiatry

## 2021-07-06 ENCOUNTER — Other Ambulatory Visit: Payer: Self-pay

## 2021-07-06 ENCOUNTER — Ambulatory Visit (INDEPENDENT_AMBULATORY_CARE_PROVIDER_SITE_OTHER): Payer: Medicare Other | Admitting: Podiatry

## 2021-07-06 DIAGNOSIS — Z794 Long term (current) use of insulin: Secondary | ICD-10-CM | POA: Diagnosis not present

## 2021-07-06 DIAGNOSIS — M79675 Pain in left toe(s): Secondary | ICD-10-CM

## 2021-07-06 DIAGNOSIS — E1140 Type 2 diabetes mellitus with diabetic neuropathy, unspecified: Secondary | ICD-10-CM

## 2021-07-06 DIAGNOSIS — M79674 Pain in right toe(s): Secondary | ICD-10-CM | POA: Diagnosis not present

## 2021-07-06 DIAGNOSIS — D689 Coagulation defect, unspecified: Secondary | ICD-10-CM

## 2021-07-06 DIAGNOSIS — B351 Tinea unguium: Secondary | ICD-10-CM | POA: Diagnosis not present

## 2021-07-06 NOTE — Progress Notes (Signed)
This patient returns to my office for at risk foot care.  This patient requires this care by a professional since this patient will be at risk due to having diabetes and chronic kidney disease and coagulation defect.  Patient is taking plavix.  This patient is unable to cut nails himself since the patient cannot reach his nails.These nails are painful walking and wearing shoes.  This patient presents for at risk foot care today.  General Appearance  Alert, conversant and in no acute stress.  Vascular  Dorsalis pedis and posterior tibial  pulses are palpable  bilaterally.  Capillary return is within normal limits  bilaterally. Temperature is within normal limits  bilaterally.  Neurologic  Senn-Weinstein monofilament wire test within normal limits  bilaterally. Muscle power within normal limits bilaterally.  Nails Thick disfigured discolored nails with subungual debris  from hallux to fifth toes bilaterally. No evidence of bacterial infection or drainage bilaterally.  Orthopedic  No limitations of motion  feet .  No crepitus or effusions noted.  No bony pathology or digital deformities noted.  Skin  normotropic skin with no porokeratosis noted bilaterally.  No signs of infections or ulcers noted.   Clavi 3rd toe right foot.  Onychomycosis  Pain in right toes  Pain in left toes  Clavi right foot.  Consent was obtained for treatment procedures.   Mechanical debridement of nails 1-5  bilaterally performed with a nail nipper.  Filed with dremel without incident.  Debride clavi with # 15 blade.   Return office visit   3 months                  Told patient to return for periodic foot care and evaluation due to potential at risk complications.   Ayumi Wangerin DPM  

## 2021-07-18 DIAGNOSIS — R413 Other amnesia: Secondary | ICD-10-CM | POA: Diagnosis not present

## 2021-07-18 DIAGNOSIS — E039 Hypothyroidism, unspecified: Secondary | ICD-10-CM | POA: Diagnosis not present

## 2021-07-18 DIAGNOSIS — I6522 Occlusion and stenosis of left carotid artery: Secondary | ICD-10-CM | POA: Diagnosis not present

## 2021-07-21 ENCOUNTER — Other Ambulatory Visit: Payer: Self-pay

## 2021-07-21 ENCOUNTER — Encounter: Payer: Self-pay | Admitting: Physician Assistant

## 2021-07-21 ENCOUNTER — Ambulatory Visit (INDEPENDENT_AMBULATORY_CARE_PROVIDER_SITE_OTHER): Payer: Medicare Other | Admitting: Physician Assistant

## 2021-07-21 VITALS — BP 173/82 | HR 81 | Resp 18 | Ht 68.0 in | Wt 252.0 lb

## 2021-07-21 DIAGNOSIS — R413 Other amnesia: Secondary | ICD-10-CM | POA: Diagnosis not present

## 2021-07-21 DIAGNOSIS — G309 Alzheimer's disease, unspecified: Secondary | ICD-10-CM

## 2021-07-21 DIAGNOSIS — F0280 Dementia in other diseases classified elsewhere without behavioral disturbance: Secondary | ICD-10-CM

## 2021-07-21 DIAGNOSIS — F0150 Vascular dementia without behavioral disturbance: Secondary | ICD-10-CM

## 2021-07-21 MED ORDER — MEMANTINE HCL 10 MG PO TABS: 10.0000 mg | ORAL_TABLET | Freq: Two times a day (BID) | ORAL | 11 refills | Status: DC

## 2021-07-21 NOTE — Patient Instructions (Addendum)
It was a pleasure to see you today at our office.   Recommendations:  Neurocognitive evaluation at our office Start Memantine 10mg  tablets.  Take 1 tablet at bedtime for 2 weeks, then 1 tablet twice daily.   Side effects include dizziness, headache, diarrhea or constipation.  Call with any questions or concerns.  Follow up in 3 months   RECOMMENDATIONS FOR ALL PATIENTS WITH MEMORY PROBLEMS: 1. Continue to exercise (Recommend 30 minutes of walking everyday, or 3 hours every week) 2. Increase social interactions - continue going to [place] and enjoy social gatherings with friends and family 3. Eat healthy, avoid fried foods and eat more fruits and vegetables 4. Maintain adequate blood pressure, blood sugar, and blood cholesterol level. Reducing the risk of stroke and cardiovascular disease also helps promoting better memory. 5. Avoid stressful situations. Live a simple life and avoid aggravations. Organize your time and prepare for the next day in anticipation. 6. Sleep well, avoid any interruptions of sleep and avoid any distractions in the bedroom that may interfere with adequate sleep quality 7. Avoid sugar, avoid sweets as there is a strong link between excessive sugar intake, diabetes, and cognitive impairment We discussed the Mediterranean diet, which has been shown to help patients reduce the risk of progressive memory disorders and reduces cardiovascular risk. This includes eating fish, eat fruits and green leafy vegetables, nuts like almonds and hazelnuts, walnuts, and also use olive oil. Avoid fast foods and fried foods as much as possible. Avoid sweets and sugar as sugar use has been linked to worsening of memory function.  There is always a concern of gradual progression of memory problems. If this is the case, then we may need to adjust level of care according to patient needs. Support, both to the patient and caregiver, should then be put into place.      You have been referred for  a neuropsychological evaluation (i.e., evaluation of memory and thinking abilities). Please bring someone with you to this appointment if possible, as it is helpful for the doctor to hear from both you and another adult who knows you well. Please bring eyeglasses and hearing aids if you wear them.    The evaluation will take approximately 3 hours and has two parts:   The first part is a clinical interview with the neuropsychologist (Dr. Melvyn Novas or Dr. Nicole Kindred). During the interview, the neuropsychologist will speak with you and the individual you brought to the appointment.    The second part of the evaluation is testing with the doctor's technician Hinton Dyer or Maudie Mercury). During the testing, the technician will ask you to remember different types of material, solve problems, and answer some questionnaires. Your family member will not be present for this portion of the evaluation.   Please note: We must reserve several hours of the neuropsychologist's time and the psychometrician's time for your evaluation appointment. As such, there is a No-Show fee of $100. If you are unable to attend any of your appointments, please contact our office as soon as possible to reschedule.    FALL PRECAUTIONS: Be cautious when walking. Scan the area for obstacles that may increase the risk of trips and falls. When getting up in the mornings, sit up at the edge of the bed for a few minutes before getting out of bed. Consider elevating the bed at the head end to avoid drop of blood pressure when getting up. Walk always in a well-lit room (use night lights in the walls). Avoid area rugs  or power cords from appliances in the middle of the walkways. Use a walker or a cane if necessary and consider physical therapy for balance exercise. Get your eyesight checked regularly.  FINANCIAL OVERSIGHT: Supervision, especially oversight when making financial decisions or transactions is also recommended.  HOME SAFETY: Consider the safety of the  kitchen when operating appliances like stoves, microwave oven, and blender. Consider having supervision and share cooking responsibilities until no longer able to participate in those. Accidents with firearms and other hazards in the house should be identified and addressed as well.   ABILITY TO BE LEFT ALONE: If patient is unable to contact 911 operator, consider using LifeLine, or when the need is there, arrange for someone to stay with patients. Smoking is a fire hazard, consider supervision or cessation. Risk of wandering should be assessed by caregiver and if detected at any point, supervision and safe proof recommendations should be instituted.  MEDICATION SUPERVISION: Inability to self-administer medication needs to be constantly addressed. Implement a mechanism to ensure safe administration of the medications.     Mediterranean Diet A Mediterranean diet refers to food and lifestyle choices that are based on the traditions of countries located on the [employer]. This way of eating has been shown to help prevent certain conditions and improve outcomes for people who have chronic diseases, like kidney disease and heart disease. What are tips for following this plan? Lifestyle  Cook and eat meals together with your family, when possible. Drink enough fluid to keep your urine clear or pale yellow. Be physically active every day. This includes: Aerobic exercise like running or swimming. Leisure activities like gardening, walking, or housework. Get 7-8 hours of sleep each night. If recommended by your health care provider, drink red wine in moderation. This means 1 glass a day for nonpregnant women and 2 glasses a day for men. A glass of wine equals 5 oz (150 mL). Reading food labels  Check the serving size of packaged foods. For foods such as rice and pasta, the serving size refers to the amount of cooked product, not dry. Check the total fat in packaged foods. Avoid foods that have  saturated fat or trans fats. Check the ingredients list for added sugars, such as corn syrup. Shopping  At the grocery store, buy most of your food from the areas near the walls of the store. This includes: Fresh fruits and vegetables (produce). Grains, beans, nuts, and seeds. Some of these may be available in unpackaged forms or large amounts (in bulk). Fresh seafood. Poultry and eggs. Low-fat dairy products. Buy whole ingredients instead of prepackaged foods. Buy fresh fruits and vegetables in-season from local farmers markets. Buy frozen fruits and vegetables in resealable bags. If you do not have access to quality fresh seafood, buy precooked frozen shrimp or canned fish, such as tuna, salmon, or sardines. Buy small amounts of raw or cooked vegetables, salads, or olives from the deli or salad bar at your store. Stock your pantry so you always have certain foods on hand, such as olive oil, canned tuna, canned tomatoes, rice, pasta, and beans. Cooking  Cook foods with extra-virgin olive oil instead of using butter or other vegetable oils. Have meat as a side dish, and have vegetables or grains as your main dish. This means having meat in small portions or adding small amounts of meat to foods like pasta or stew. Use beans or vegetables instead of meat in common dishes like chili or lasagna. Experiment with different cooking  methods. Try roasting or broiling vegetables instead of steaming or sauteing them. Add frozen vegetables to soups, stews, pasta, or rice. Add nuts or seeds for added healthy fat at each meal. You can add these to yogurt, salads, or vegetable dishes. Marinate fish or vegetables using olive oil, lemon juice, garlic, and fresh herbs. Meal planning  Plan to eat 1 vegetarian meal one day each week. Try to work up to 2 vegetarian meals, if possible. Eat seafood 2 or more times a week. Have healthy snacks readily available, such as: Vegetable sticks with hummus. Greek  yogurt. Fruit and nut trail mix. Eat balanced meals throughout the week. This includes: Fruit: 2-3 servings a day Vegetables: 4-5 servings a day Low-fat dairy: 2 servings a day Fish, poultry, or lean meat: 1 serving a day Beans and legumes: 2 or more servings a week Nuts and seeds: 1-2 servings a day Whole grains: 6-8 servings a day Extra-virgin olive oil: 3-4 servings a day Limit red meat and sweets to only a few servings a month What are my food choices? Mediterranean diet Recommended Grains: Whole-grain pasta. Brown rice. Bulgar wheat. Polenta. Couscous. Whole-wheat bread. [place]. Vegetables: Artichokes. Beets. Broccoli. Cabbage. Carrots. Eggplant. Green beans. Chard. Kale. Spinach. Onions. Leeks. Peas. Squash. Tomatoes. Peppers. Radishes. Fruits: Apples. Apricots. Avocado. Berries. Bananas. Cherries. Dates. Figs. Grapes. Lemons. Melon. Oranges. Peaches. Plums. Pomegranate. Meats and other protein foods: Beans. Almonds. Sunflower seeds. Pine nuts. Peanuts. [place]. Salmon. Scallops. Shrimp. [place]. Tilapia. Clams. Oysters. Eggs. Dairy: Low-fat milk. Cheese. Greek yogurt. Beverages: Water. Red wine. Herbal tea. Fats and oils: Extra virgin olive oil. Avocado oil. Grape seed oil. Sweets and desserts: [place] yogurt with honey. Baked apples. Poached pears. Trail mix. Seasoning and other foods: Basil. Cilantro. Coriander. Cumin. Mint. Parsley. Sage. Rosemary. Tarragon. Garlic. Oregano. Thyme. Pepper. Balsalmic vinegar. Tahini. Hummus. Tomato sauce. Olives. Mushrooms. Limit these Grains: Prepackaged pasta or rice dishes. Prepackaged cereal with added sugar. Vegetables: Deep fried potatoes (french fries). Fruits: Fruit canned in syrup. Meats and other protein foods: Beef. Pork. Lamb. Poultry with skin. Hot dogs. Berniece Salines. Dairy: Ice cream. Sour cream. Whole milk. Beverages: Juice. Sugar-sweetened soft drinks. Beer. Liquor and spirits. Fats and oils: Butter. Canola oil. Vegetable oil. Beef  fat (tallow). Lard. Sweets and desserts: Cookies. Cakes. Pies. Candy. Seasoning and other foods: Mayonnaise. Premade sauces and marinades. The items listed may not be a complete list. Talk with your dietitian about what dietary choices are right for you. Summary The Mediterranean diet includes both food and lifestyle choices. Eat a variety of fresh fruits and vegetables, beans, nuts, seeds, and whole grains. Limit the amount of red meat and sweets that you eat. Talk with your health care provider about whether it is safe for you to drink red wine in moderation. This means 1 glass a day for nonpregnant women and 2 glasses a day for men. A glass of wine equals 5 oz (150 mL). This information is not intended to replace advice given to you by your health care provider. Make sure you discuss any questions you have with your health care provider. Document Released: 12/30/2015 Document Revised: 02/01/2016 Document Reviewed: 12/30/2015 Elsevier Interactive Patient Education  2017 [employer].

## 2021-07-21 NOTE — Progress Notes (Addendum)
Assessment/Plan:   [name] is a very pleasant 88 y.o. 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  CT scan of the head on 05/02/2021 was remarkable for significant atrophy with extensive chronic microvascular ischemic changes and acute findings,  consistent with mixed vascular and Alzheimer's disease.     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  Discussed the importance of regular daily schedule with inclusion of crossword puzzles to maintain brain function.  Continue to monitor mood with PCP.  Stay active at least 30 minutes at least 3 times a week.  Naps should be scheduled and should be no longer than 60 minutes and should not occur after 2 PM.  Control cardiovascular risk factors  Mediterranean diet is recommended  Neurocognitive evaluation at our office to determine other causes of memory loss, including depression, anxiety, attention, and for clarity of the diagnosis.  Start Memantine 10 mg: Take 1 tablet (10 mg at night) for 2 weeks, then increase to 1 tablet (10 mg) twice a day side effects discussed [place] up in 3 months  Subjective:    The patient is seen in neurologic consultation at the request of William Jordan, MD for the evaluation of memory.  The patient is accompanied by his daughter-in-law who supplements the history.  His wife is on the phone to participate in supplement information. This is a 88 y.o. year old RH  male originally from [name] who initially reported having memory issues since December 2022, when he presented to the ED, with 4 day history of confusion.  Work-up was suspicious for hypoglycemia, versus TIA versus  dementia.  After discharge, he was seen by his PCP, who referred him here for further evaluation.  After questioning, he reports that "the memory problems may have been longer than that, when I was in [name], I could not even remember [place] words which obviously I was fluent in ".  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  He used to exhibit his photos at [employer], and some of them are not familiar to him.  He finds himself repeating his questions and telling the same stories.  Sometimes he cannot remember long-term information, such as his sister's birthday.  He needs more time to think the answers than before.  He denies being disoriented when walking into a room.  He denies leaving objects in unusual places.  He ambulates with some difficulty, he has a right frozen shoulder, and has some balance issues.  He denies any head injuries or recent falls.  [name] does the driving, as the patient has "several small strokes and diabetes, and when the low sugar comes, he shakes and closes worry".  The patient lives with his wife and his daughter-in-law who noticed the same changes.  His mood is overall stable, but lately, he has been more preoccupied, occasionally double and triple checking his wallet for example.  He denies depression, although he finds himself bored, seems he is computer has not been working for a few days, and "I am lost without it, waiting until he gets  repaired ".  He does report some irritability due to the frustration of not being able to remember as before. He does not sleep well, or feels rested, because of his right frozen shoulder, which causes discomfort when he is moving in the bed.  He tried PT in the past, "it never worked ".  He denies vivid dreams, occasionally he may have a nightmare, denies sleepwalking, hallucinations or paranoia.  He denies any hygiene concerns, he  needs help with bathing and dressing because of the shoulder.  He has been always meticulous about his medications, but his daughter-in-law helps him monitor her.  He is wife is an Optometrist, and has always been in charge of the finances.  His appetite is somewhat decreased "he is less enthusiastic about food lately because of the situation".  He denies trouble swallowing.  He admits to not drinking enough water.  He does not cook.  He denies any significant headaches, occasionally he may have a migraine which resolves quickly.  He denies double vision, dizziness, focal numbness, or tingling.  He has a right frozen shoulder as mentioned before, denies tremors or anosmia.  No history of seizures.  Denies urine incontinence, retention, he does have issues with constipation, denies diarrhea.  20 years ago, he was diagnosed with OSA, but he never wanted to use his CPAP.  He denies alcohol or tobacco.  Family history of 1 aunt with Alzheimer's disease  Pertinent labs A1c 7.5 (high), LDL 77 TSH 2.91, vitamin B12 311  CT of the head without contrast 04/22/2021 No acute intracranial process.. 2. Atrophy with extensive chronic microvascular ischemic changes.3. Chronic right maxillary sinus disease.  No Known Allergies  Current Outpatient Medications  Medication Instructions   acetaminophen (TYLENOL) 500 mg, Oral, 2 times daily   aspirin 81 mg, Oral, Every evening   BD VEO INSULIN SYRINGE U/F 31G X 15/64" 1 ML MISC USE TO ADMINSTER INSULIN SYRINGE TWICE DAILY   bisacodyl (DULCOLAX) 5 MG EC tablet 3 tablets as needed   calcitRIOL (ROCALTROL) 0.25 mcg, Oral, Every M-W-F, evening   clopidogrel (PLAVIX) 75 MG tablet Take 1 tablet by mouth once daily   Emollient (UDDERLY SMOOTH EX) 1 application, Daily   famotidine (PEPCID) 20 mg, Oral, Daily   furosemide (LASIX) 20 mg, Oral, Every evening   insulin NPH-regular Human (70-30) 100 UNIT/ML injection 50 Units, Subcutaneous, 2 times daily with meals   irbesartan  (AVAPRO) 150 mg, Oral, Daily   levothyroxine (SYNTHROID) 25 mcg, Oral, Daily   memantine (NAMENDA) 10 mg, Oral, 2 times daily   metoprolol succinate (TOPROL-XL) 25 MG 24 hr tablet Take 1 tablet by mouth once daily   Multiple Vitamin (RENAL MULTIVITAMIN/ZINC PO) No dose, route, or frequency recorded.   nitroGLYCERIN (NITROSTAT) 0.4 mg, Sublingual, Every 5 min PRN   ONETOUCH ULTRA test strip USE TWICE DAILY TO CHECK GLUCOSE PRIOR TO INSULIN USE   OVER THE COUNTER MEDICATION 1 capsule, Every evening   rosuvastatin (CRESTOR) 40 MG tablet Take 1 tablet by mouth once daily     VITALS:   Vitals:   07/21/21 1001  BP: (!) 173/82  Pulse: 81  Resp: 18  SpO2: 95%  Weight: 252 lb (114.3 kg)  Height: 5\' 8"  (1.727 m)     PHYSICAL EXAM   HEENT:  Normocephalic, atraumatic. The mucous membranes are moist. The superficial temporal arteries are without ropiness or tenderness. Cardiovascular: Regular rate and rhythm. Lungs: Clear to auscultation bilaterally. Neck: There are no carotid bruits noted bilaterally.  NEUROLOGICAL: Montreal Cognitive Assessment  07/22/2021  Visuospatial/ Executive (0/5) 4  Naming (0/3) 2  Attention: Read list of digits (0/2) 1  Attention: Read list of letters (0/1) 1  Attention: Serial 7 subtraction starting at 100 (0/3) 2  Language: Repeat phrase (0/2) 2  Language : Fluency (0/1) 0  Abstraction (0/2) 2  Delayed Recall (0/5) 0  Orientation (0/6) 6  Total 20  Adjusted Score (based on education) 20   No flowsheet data found.  No flowsheet data found.   Orientation:  Alert and oriented to person, place and time. No aphasia or dysarthria. Fund of knowledge is appropriate. Recent memory impaired and remote memory intact.  Attention and concentration are normal.  Able to name objects and repeat phrases 2 out of 3. Delayed recall 0/5 Cranial nerves: There is good facial symmetry. Extraocular muscles are intact and visual fields are full to confrontational testing.  Speech is fluent and clear. Soft palate rises symmetrically and there is no tongue deviation. Hearing is intact to conversational tone. Tone: Tone is good throughout. Sensation: Sensation is intact to light touch and pinprick throughout. Vibration is intact at the bilateral big toe.There is no extinction with double simultaneous stimulation. There is no sensory dermatomal level identified. Coordination: The patient has no difficulty with RAM's or FNF bilaterally. Normal finger to nose  Motor: Strength is 5/5 in the bilateral upper and lower extremities.  He has limited mobility on the right due to frozen shoulder.  There is no pronator drift. There are no fasciculations noted. DTR's: Deep tendon reflexes are 1/4 at the bilateral biceps, triceps, brachioradialis, patella and achilles.  Plantar responses are downgoing bilaterally. Gait and Station: The patient is able to ambulate with some  difficulty.The patient is unable able to heel toe walk without any difficulty.The patient is unable able to ambulate in a tandem fashion. The patient is able to stand in the Romberg position.     Thank you for allowing [place] the opportunity to participate in the care of this nice patient. Please do not hesitate to contact us for any questions or concerns.   Total time spent on today's visit was 60 minutes, including both face-to-face time and nonface-to-face time.  Time included that spent on review of records (prior notes available to me/labs/imaging if pertinent), discussing treatment and goals, answering patient's questions and coordinating care.  Cc:  William Jordan, MD  Sharene Butters 07/22/2021 7:37 AM

## 2021-07-22 DIAGNOSIS — G309 Alzheimer's disease, unspecified: Secondary | ICD-10-CM | POA: Insufficient documentation

## 2021-07-22 DIAGNOSIS — F0150 Vascular dementia without behavioral disturbance: Secondary | ICD-10-CM | POA: Insufficient documentation

## 2021-07-24 ENCOUNTER — Other Ambulatory Visit: Payer: Self-pay | Admitting: Cardiovascular Disease

## 2021-08-04 DIAGNOSIS — N2581 Secondary hyperparathyroidism of renal origin: Secondary | ICD-10-CM | POA: Diagnosis not present

## 2021-08-04 DIAGNOSIS — I129 Hypertensive chronic kidney disease with stage 1 through stage 4 chronic kidney disease, or unspecified chronic kidney disease: Secondary | ICD-10-CM | POA: Diagnosis not present

## 2021-08-04 DIAGNOSIS — E1122 Type 2 diabetes mellitus with diabetic chronic kidney disease: Secondary | ICD-10-CM | POA: Diagnosis not present

## 2021-08-04 DIAGNOSIS — E785 Hyperlipidemia, unspecified: Secondary | ICD-10-CM | POA: Diagnosis not present

## 2021-08-04 DIAGNOSIS — I2510 Atherosclerotic heart disease of native coronary artery without angina pectoris: Secondary | ICD-10-CM | POA: Diagnosis not present

## 2021-08-04 DIAGNOSIS — N189 Chronic kidney disease, unspecified: Secondary | ICD-10-CM | POA: Diagnosis not present

## 2021-08-04 DIAGNOSIS — I495 Sick sinus syndrome: Secondary | ICD-10-CM | POA: Diagnosis not present

## 2021-08-04 DIAGNOSIS — N184 Chronic kidney disease, stage 4 (severe): Secondary | ICD-10-CM | POA: Diagnosis not present

## 2021-08-04 DIAGNOSIS — Z6841 Body Mass Index (BMI) 40.0 and over, adult: Secondary | ICD-10-CM | POA: Diagnosis not present

## 2021-08-06 ENCOUNTER — Other Ambulatory Visit: Payer: Self-pay | Admitting: Cardiovascular Disease

## 2021-08-06 DIAGNOSIS — Z20822 Contact with and (suspected) exposure to covid-19: Secondary | ICD-10-CM | POA: Diagnosis not present

## 2021-08-08 ENCOUNTER — Telehealth: Payer: Self-pay | Admitting: Cardiovascular Disease

## 2021-08-08 NOTE — Telephone Encounter (Signed)
Refills sent to [facility]. ?

## 2021-08-08 NOTE — Telephone Encounter (Signed)
?*  STAT* If patient is at the pharmacy, call can be transferred to refill team. ? ? ?1. Which medications need to be refilled? (please list name of each medication and dose if known)  ?rosuvastatin (CRESTOR) 40 MG tablet ?clopidogrel (PLAVIX) 75 MG tablet ? ?2. Which pharmacy/location (including street and city if local pharmacy) is medication to be sent to? ? ?[place], [place] - [address] ? ?3. Do they need a 30 day or 90 day supply?  ? ?Patient's wife states the patient is completely out of medication. She is requesting a supply to last the patient until 5/15 appointment with Dr. Sallyanne Kuster. ? ?

## 2021-08-12 DIAGNOSIS — Z20822 Contact with and (suspected) exposure to covid-19: Secondary | ICD-10-CM | POA: Diagnosis not present

## 2021-08-16 ENCOUNTER — Telehealth: Payer: Self-pay | Admitting: Physician Assistant

## 2021-08-16 NOTE — Telephone Encounter (Signed)
Pt's wife called in stating they were talking with [facility] and was asked to contact us to see if he could lower his dosage of his memantine to once a day since the pt only has use of 29% of his kidneys. ?

## 2021-08-17 NOTE — Telephone Encounter (Signed)
I advised to take one tablet per Clarise Cruz.  ?

## 2021-08-19 ENCOUNTER — Other Ambulatory Visit: Payer: Self-pay | Admitting: Cardiovascular Disease

## 2021-08-19 ENCOUNTER — Telehealth: Payer: Self-pay | Admitting: Physician Assistant

## 2021-08-19 DIAGNOSIS — E039 Hypothyroidism, unspecified: Secondary | ICD-10-CM | POA: Diagnosis not present

## 2021-08-19 DIAGNOSIS — E1142 Type 2 diabetes mellitus with diabetic polyneuropathy: Secondary | ICD-10-CM | POA: Diagnosis not present

## 2021-08-19 DIAGNOSIS — N184 Chronic kidney disease, stage 4 (severe): Secondary | ICD-10-CM | POA: Diagnosis not present

## 2021-08-19 DIAGNOSIS — I5042 Chronic combined systolic (congestive) and diastolic (congestive) heart failure: Secondary | ICD-10-CM | POA: Diagnosis not present

## 2021-08-19 NOTE — Telephone Encounter (Signed)
Legrand Como called regarding patients memantine. Trying to clarify the dosage.  ?[phone] ?He said he was with [facility] physician ?

## 2021-08-25 ENCOUNTER — Emergency Department (HOSPITAL_BASED_OUTPATIENT_CLINIC_OR_DEPARTMENT_OTHER): Payer: Medicare Other | Admitting: Radiology

## 2021-08-25 ENCOUNTER — Encounter (HOSPITAL_BASED_OUTPATIENT_CLINIC_OR_DEPARTMENT_OTHER): Payer: Self-pay | Admitting: Emergency Medicine

## 2021-08-25 ENCOUNTER — Emergency Department (HOSPITAL_BASED_OUTPATIENT_CLINIC_OR_DEPARTMENT_OTHER)
Admission: EM | Admit: 2021-08-25 | Discharge: 2021-08-25 | Disposition: A | Discharge status: Home / Self Care | Payer: Medicare Other | Attending: Emergency Medicine | Admitting: Emergency Medicine

## 2021-08-25 ENCOUNTER — Other Ambulatory Visit: Payer: Self-pay

## 2021-08-25 DIAGNOSIS — Z794 Long term (current) use of insulin: Secondary | ICD-10-CM | POA: Insufficient documentation

## 2021-08-25 DIAGNOSIS — K5641 Fecal impaction: Secondary | ICD-10-CM | POA: Insufficient documentation

## 2021-08-25 DIAGNOSIS — E1140 Type 2 diabetes mellitus with diabetic neuropathy, unspecified: Secondary | ICD-10-CM | POA: Insufficient documentation

## 2021-08-25 DIAGNOSIS — E1122 Type 2 diabetes mellitus with diabetic chronic kidney disease: Secondary | ICD-10-CM | POA: Diagnosis not present

## 2021-08-25 DIAGNOSIS — K5900 Constipation, unspecified: Secondary | ICD-10-CM | POA: Diagnosis not present

## 2021-08-25 DIAGNOSIS — I129 Hypertensive chronic kidney disease with stage 1 through stage 4 chronic kidney disease, or unspecified chronic kidney disease: Secondary | ICD-10-CM | POA: Diagnosis not present

## 2021-08-25 DIAGNOSIS — Z7901 Long term (current) use of anticoagulants: Secondary | ICD-10-CM | POA: Diagnosis not present

## 2021-08-25 DIAGNOSIS — N1832 Chronic kidney disease, stage 3b: Secondary | ICD-10-CM | POA: Diagnosis not present

## 2021-08-25 DIAGNOSIS — I2510 Atherosclerotic heart disease of native coronary artery without angina pectoris: Secondary | ICD-10-CM | POA: Diagnosis not present

## 2021-08-25 DIAGNOSIS — Z7982 Long term (current) use of aspirin: Secondary | ICD-10-CM | POA: Diagnosis not present

## 2021-08-25 DIAGNOSIS — Z79899 Other long term (current) drug therapy: Secondary | ICD-10-CM | POA: Diagnosis not present

## 2021-08-25 DIAGNOSIS — K8020 Calculus of gallbladder without cholecystitis without obstruction: Secondary | ICD-10-CM | POA: Diagnosis not present

## 2021-08-25 MED ORDER — POLYETHYLENE GLYCOL 3350 17 G PO PACK: 17.0000 g | PACK | Freq: Every day | ORAL | 0 refills | Status: AC

## 2021-08-25 MED ORDER — FLEET ENEMA 7-19 GM/118ML RE ENEM
1.0000 | ENEMA | Freq: Once | RECTAL | Status: AC
Start: 2021-08-25 — End: 2021-08-25
  Administered 2021-08-25: 1 via RECTAL
  Filled 2021-08-25: qty 1

## 2021-08-25 NOTE — ED Triage Notes (Signed)
Pt via pov from home with constipation x 6 days. Pt reports hx of impaction 20 year ago and states that he generally takes a laxative after 5 days if needed; states he took laxative yesterday and still has not had a bowel movement. Pt alert & oriented, nad noted.  ?

## 2021-08-25 NOTE — ED Notes (Signed)
Patient verbalizes understanding of discharge instructions. Opportunity for questioning and answers were provided. Patient discharged from ED.  °

## 2021-08-25 NOTE — ED Provider Notes (Signed)
?[facility] EMERGENCY DEPT ?Provider Note ? ? ?CSN: [id number] ?Arrival date & time: 08/25/21  1245 ? ?  ? ?History ? ?Chief Complaint  ?Patient presents with  ? Constipation  ? ? ?[name] is a 88 y.o. male. ? ?HPI ?PMH of CHB/PPM, CAD, IDDM-2 with neuropathy, CKD-3B, HTN, left carotid artery stenosis. ? ?Patient reports he had no bowel movement for 6 days.  Historically has been able to use stool softeners and Dulcolax.  He reports that he has tried to manually disimpact but when he sits down of a bowel movement he can feel the stool at the anus but cannot expel it.  Reports has become very hard.  She started to feel some abdominal discomfort but has not had significant abdominal pain.  No vomiting.  No fever no pain or difficulty urinating ?  ? ?Home Medications ?Prior to Admission medications   ?Medication Sig Start Date End Date Taking? Authorizing Provider  ?acetaminophen (TYLENOL) 500 MG tablet Take 500 mg by mouth 2 (two) times daily.   Yes [provider]  ?aspirin 81 MG tablet Take 81 mg by mouth every evening.    Yes [provider]  ?BD VEO INSULIN SYRINGE U/F 31G X 15/64" 1 ML MISC USE TO [place] DAILY 04/01/19  Yes [provider]  ?bisacodyl (DULCOLAX) 5 MG EC tablet 3 tablets as needed   Yes [provider]  ?calcitRIOL (ROCALTROL) 0.25 MCG capsule Take 0.25 mcg by mouth every Monday, Wednesday, and Friday. evening   Yes [provider]  ?clopidogrel (PLAVIX) 75 MG tablet Take 1 tablet by mouth once daily 08/08/21  Yes Croitoru, Mihai, MD  ?Emollient (UDDERLY SMOOTH EX) Apply 1 application. topically daily. Apply to both feet   Yes [provider]  ?famotidine (PEPCID) 20 MG tablet Take 1 tablet (20 mg total) by mouth daily. 05/02/21  Yes Mercy Riding, MD  ?furosemide (LASIX) 20 MG tablet Take 1 tablet (20 mg total) by mouth every evening. 05/06/21  Yes Mercy Riding, MD  ?insulin NPH-regular Human (70-30)  100 UNIT/ML injection Inject 50 Units into the skin 2 (two) times daily with a meal. 05/02/21  Yes Gonfa, Taye T, MD  ?levothyroxine (SYNTHROID, LEVOTHROID) 25 MCG tablet Take 25 mcg by mouth daily.  02/09/14  Yes [provider]  ?memantine (NAMENDA) 10 MG tablet Take 1 tablet (10 mg total) by mouth 2 (two) times daily. 07/21/21  Yes Rondel Jumbo, PA-C  ?metoprolol succinate (TOPROL-XL) 25 MG 24 hr tablet Take 1 tablet (25 mg total) by mouth daily. PATIENT MUST KEEP APPOINTMENT FOR FUTURE REFILLS 08/22/21  Yes Croitoru, Dani Gobble, MD  ?Multiple Vitamin (RENAL MULTIVITAMIN/ZINC PO)    Yes [provider]  ?ONETOUCH ULTRA test strip  05/10/19  Yes [provider]  ?OVER THE COUNTER MEDICATION Take 1 capsule by mouth every evening. OTC vitamin for patients with kidney issues   Yes [provider]  ?polyethylene glycol (MIRALAX / GLYCOLAX) 17 g packet Take 17 g by mouth daily. 08/25/21  Yes Charlesetta Shanks, MD  ?rosuvastatin (CRESTOR) 40 MG tablet Take 1 tablet by mouth once daily 08/08/21  Yes Croitoru, Mihai, MD  ?irbesartan (AVAPRO) 150 MG tablet Take 1 tablet (150 mg total) by mouth daily. 05/06/21   Mercy Riding, MD  ?nitroGLYCERIN (NITROSTAT) 0.4 MG SL tablet Place 1 tablet (0.4 mg total) under the tongue every 5 (five) minutes as needed for chest pain. 12/27/18   Croitoru, Dani Gobble, MD  ?   ? ?  Allergies    ?Patient has no known allergies.   ? ?Review of Systems   ?Review of Systems ?10 Systems reviewed negative except as per HPI ?Physical Exam ?Updated Vital Signs ?BP (!) 164/113 (BP Location: Right Arm)   Pulse 85   Temp 97.9 ?F (36.6 ?C)   Resp 16   Ht '5\' 8"'$  (1.727 m)   Wt 112.6 kg   SpO2 95%   BMI 37.74 kg/m?  ?Physical Exam ?Constitutional:   ?   Comments: Alert nontoxic clear mental status no respiratory distress  ?HENT:  ?   Mouth/Throat:  ?   Pharynx: Oropharynx is clear.  ?Eyes:  ?   Extraocular Movements: Extraocular movements intact.  ?Cardiovascular:  ?   Rate and Rhythm:  Normal rate and regular rhythm.  ?Pulmonary:  ?   Effort: Pulmonary effort is normal.  ?   Breath sounds: Normal breath sounds.  ?Abdominal:  ?   Comments: Distended.  Soft without guarding.  No focal tenderness  ?Genitourinary: ?   Comments:  external exam of anus minor hemorrhoid tags.  Otherwise normal.  Digital exam firm impacted stool. ?Musculoskeletal:     ?   General: No swelling. Normal range of motion.  ?   Right lower leg: No edema.  ?   Left lower leg: No edema.  ?Skin: ?   General: Skin is warm and dry.  ?Neurological:  ?   General: No focal deficit present.  ?   Mental Status: He is oriented to person, place, and time.  ?   Coordination: Coordination normal.  ?Psychiatric:     ?   Mood and Affect: Mood normal.  ? ? ?ED Results / Procedures / Treatments   ?Labs ?(all labs ordered are listed, but only abnormal results are displayed) ?Labs Reviewed - No data to display ? ?EKG ?None ? ?Radiology ?DG Abd Acute W/Chest ? ?Result Date: 08/25/2021 ?CLINICAL DATA:  Constipation.  Stool impaction. EXAM: DG ABDOMEN ACUTE WITH 1 VIEW CHEST COMPARISON:  11/09/2016 plain films of the abdomen. Chest radiograph 05/02/2021. FINDINGS: Frontal view of the chest demonstrates dual lead pacer at right atrium and right ventricle. Probable surgical clips projecting over the right lung base. Chin overlies the apices. Moderate cardiomegaly. Atherosclerosis in the transverse aorta. No pleural effusion or pneumothorax. No congestive failure. Bibasilar scarring or atelectasis. Abdominal films demonstrate no free intraperitoneal air or significant air-fluid levels on upright positioning. No gaseous distention of bowel loops on supine imaging. Multiple gallstones. 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 Probable phlebolith in the right hemipelvis. Thoracolumbar spondylosis. IMPRESSION: No acute findings.  No evidence of fecal impaction. Cardiomegaly and low lung volumes. Cholelithiasis Aortic Atherosclerosis (ICD10-I70.0). Electronically  Signed   By: Abigail Miyamoto M.D.   On: 08/25/2021 16:27   ? ?Procedures ?Procedures  ? ?Fecal disimpaction: manual disimpaction done by myself.  Large amount of hard formed stool balls. ?Medications Ordered in ED ?Medications  ?sodium phosphate (FLEET) 7-19 GM/118ML enema 1 enema (1 enema Rectal Given 08/25/21 1543)  ? ? ?ED Course/ Medical Decision Making/ A&P ?  ?                        ?Medical Decision Making ?Amount and/or Complexity of Data Reviewed ?Radiology: ordered. ? ?Risk ?OTC drugs. ? ? ?Patient has constipation for 6 days no bowel movement.  He feels rectal pressure and mass.  He describes having been able to assist having bowel movements by self disimpacting on exam patient  is impacted with multiple firm stool balls.  He was disimpacted at bedside. ? ?Patient subsequently given fleets enema.  He had extremely large bowel movement.  He feels much improved. ? ?Acute abdominal series obtained and reviewed by radiology.  No acute findings.  No signs of obstruction or other acute abnormality. ? ?Patient is significantly clinically improved.  He is comfortable.  At this time plan will be for continued twice daily Colace.  I reviewed daily MiraLAX with the patient.  He is advised to follow-up with his PCP for continued monitoring for chronic recurrent constipation with occasional fecal impaction. ? ? ? ? ? ? ? ?Final Clinical Impression(s) / ED Diagnoses ?Final diagnoses:  ?Constipation, unspecified constipation type  ?Fecal impaction ([place])  ? ? ?Rx / DC Orders ?ED Discharge Orders   ? ?      Ordered  ?  polyethylene glycol (MIRALAX / GLYCOLAX) 17 g packet  Daily       ? 08/25/21 1650  ? ?  ?  ? ?  ? ? ?  ?Charlesetta Shanks, MD ?08/25/21 1652 ? ?

## 2021-08-25 NOTE — Discharge Instructions (Signed)
1.  Take Colace twice a day every day. ?2.  Take MiraLAX daily as prescribed. ?3.  See your family doctor for recheck to discuss management of constipation at home. ?4.  You may try a fleets enema from the pharmacy.  You may also occasionally try a bottle of magnesium citrate.  This can be bought at the pharmacy. ?

## 2021-08-25 NOTE — ED Triage Notes (Signed)
Denies n/v; states he is eating and drinking with no problem. ?

## 2021-09-05 DIAGNOSIS — Z20822 Contact with and (suspected) exposure to covid-19: Secondary | ICD-10-CM | POA: Diagnosis not present

## 2021-09-12 ENCOUNTER — Encounter: Payer: Medicare Other | Admitting: Cardiovascular Disease

## 2021-09-13 DIAGNOSIS — G479 Sleep disorder, unspecified: Secondary | ICD-10-CM | POA: Diagnosis not present

## 2021-09-13 DIAGNOSIS — I7300 Raynaud's syndrome without gangrene: Secondary | ICD-10-CM | POA: Diagnosis not present

## 2021-09-13 DIAGNOSIS — M25511 Pain in right shoulder: Secondary | ICD-10-CM | POA: Diagnosis not present

## 2021-09-13 DIAGNOSIS — H9319 Tinnitus, unspecified ear: Secondary | ICD-10-CM | POA: Diagnosis not present

## 2021-09-13 DIAGNOSIS — K5900 Constipation, unspecified: Secondary | ICD-10-CM | POA: Diagnosis not present

## 2021-09-15 DIAGNOSIS — M19011 Primary osteoarthritis, right shoulder: Secondary | ICD-10-CM | POA: Diagnosis not present

## 2021-09-15 DIAGNOSIS — M25511 Pain in right shoulder: Secondary | ICD-10-CM

## 2021-09-15 HISTORY — DX: Pain in right shoulder: M25.511

## 2021-09-16 DIAGNOSIS — G309 Alzheimer's disease, unspecified: Secondary | ICD-10-CM | POA: Diagnosis not present

## 2021-09-16 DIAGNOSIS — I5042 Chronic combined systolic (congestive) and diastolic (congestive) heart failure: Secondary | ICD-10-CM | POA: Diagnosis not present

## 2021-09-16 DIAGNOSIS — I2510 Atherosclerotic heart disease of native coronary artery without angina pectoris: Secondary | ICD-10-CM | POA: Diagnosis not present

## 2021-09-16 DIAGNOSIS — N184 Chronic kidney disease, stage 4 (severe): Secondary | ICD-10-CM | POA: Diagnosis not present

## 2021-09-16 DIAGNOSIS — E1142 Type 2 diabetes mellitus with diabetic polyneuropathy: Secondary | ICD-10-CM | POA: Diagnosis not present

## 2021-09-16 DIAGNOSIS — E039 Hypothyroidism, unspecified: Secondary | ICD-10-CM | POA: Diagnosis not present

## 2021-09-21 ENCOUNTER — Other Ambulatory Visit: Payer: Self-pay | Admitting: Cardiovascular Disease

## 2021-09-24 DIAGNOSIS — Z20822 Contact with and (suspected) exposure to covid-19: Secondary | ICD-10-CM | POA: Diagnosis not present

## 2021-09-27 ENCOUNTER — Ambulatory Visit (INDEPENDENT_AMBULATORY_CARE_PROVIDER_SITE_OTHER): Payer: Medicare Other

## 2021-09-27 DIAGNOSIS — I495 Sick sinus syndrome: Secondary | ICD-10-CM | POA: Diagnosis not present

## 2021-09-27 LAB — CUP PACEART REMOTE DEVICE CHECK
Battery Remaining Longevity: 89 mo
Battery Remaining Percentage: 77 %
Battery Voltage: 3.01 V
Brady Statistic AP VP Percent: 17 %
Brady Statistic AP VS Percent: 1 %
Brady Statistic AS VP Percent: 83 %
Brady Statistic AS VS Percent: 1 %
Brady Statistic RA Percent Paced: 16 %
Brady Statistic RV Percent Paced: 99 %
Date Time Interrogation Session: 20230509040016
Implantable Lead Implant Date: 19901207
Implantable Lead Implant Date: 19901207
Implantable Lead Location: 753859
Implantable Lead Location: 753860
Implantable Pulse Generator Implant Date: 20201116
Lead Channel Impedance Value: 290 Ohm
Lead Channel Impedance Value: 530 Ohm
Lead Channel Pacing Threshold Amplitude: 0.875 V
Lead Channel Pacing Threshold Amplitude: 1.25 V
Lead Channel Pacing Threshold Pulse Width: 0.4 ms
Lead Channel Pacing Threshold Pulse Width: 0.4 ms
Lead Channel Sensing Intrinsic Amplitude: 4.2 mV
Lead Channel Sensing Intrinsic Amplitude: 4.8 mV
Lead Channel Setting Pacing Amplitude: 1.5 V
Lead Channel Setting Pacing Amplitude: 2 V
Lead Channel Setting Pacing Pulse Width: 0.4 ms
Lead Channel Setting Sensing Sensitivity: 3 mV
Pulse Gen Model: 2272
Pulse Gen Serial Number: 9177614

## 2021-09-29 ENCOUNTER — Other Ambulatory Visit: Payer: Self-pay | Admitting: Cardiovascular Disease

## 2021-10-01 ENCOUNTER — Emergency Department (HOSPITAL_COMMUNITY): Payer: Medicare Other

## 2021-10-01 ENCOUNTER — Other Ambulatory Visit: Payer: Self-pay

## 2021-10-01 ENCOUNTER — Emergency Department (HOSPITAL_COMMUNITY)
Admission: EM | Admit: 2021-10-01 | Discharge: 2021-10-01 | Disposition: A | Discharge status: Home / Self Care | Payer: Medicare Other | Attending: Emergency Medicine | Admitting: Emergency Medicine

## 2021-10-01 DIAGNOSIS — R1031 Right lower quadrant pain: Secondary | ICD-10-CM | POA: Diagnosis not present

## 2021-10-01 DIAGNOSIS — Z7982 Long term (current) use of aspirin: Secondary | ICD-10-CM | POA: Diagnosis not present

## 2021-10-01 DIAGNOSIS — Z794 Long term (current) use of insulin: Secondary | ICD-10-CM | POA: Diagnosis not present

## 2021-10-01 DIAGNOSIS — K5900 Constipation, unspecified: Secondary | ICD-10-CM | POA: Insufficient documentation

## 2021-10-01 DIAGNOSIS — K8020 Calculus of gallbladder without cholecystitis without obstruction: Secondary | ICD-10-CM | POA: Diagnosis not present

## 2021-10-01 DIAGNOSIS — K429 Umbilical hernia without obstruction or gangrene: Secondary | ICD-10-CM | POA: Diagnosis not present

## 2021-10-01 LAB — COMPREHENSIVE METABOLIC PANEL
ALT: 21 U/L (ref 0–44)
AST: 29 U/L (ref 15–41)
Albumin: 3.4 g/dL — ABNORMAL LOW (ref 3.5–5.0)
Alkaline Phosphatase: 49 U/L (ref 38–126)
Anion gap: 9 (ref 5–15)
BUN: 33 mg/dL — ABNORMAL HIGH (ref 8–23)
CO2: 23 mmol/L (ref 22–32)
Calcium: 9 mg/dL (ref 8.9–10.3)
Chloride: 104 mmol/L (ref 98–111)
Creatinine, Ser: 2.06 mg/dL — ABNORMAL HIGH (ref 0.61–1.24)
GFR, Estimated: 31 mL/min — ABNORMAL LOW (ref >60)
Glucose, Bld: 244 mg/dL — ABNORMAL HIGH (ref 70–99)
Potassium: 4.8 mmol/L (ref 3.5–5.1)
Sodium: 136 mmol/L (ref 135–145)
Total Bilirubin: 0.5 mg/dL (ref 0.3–1.2)
Total Protein: 6.8 g/dL (ref 6.5–8.1)

## 2021-10-01 LAB — URINALYSIS, ROUTINE W REFLEX MICROSCOPIC
Bacteria, UA: NONE SEEN
Bilirubin Urine: NEGATIVE
Glucose, UA: 50 mg/dL — AB
Hgb urine dipstick: NEGATIVE
Ketones, ur: NEGATIVE mg/dL
Leukocytes,Ua: NEGATIVE
Nitrite: NEGATIVE
Protein, ur: 30 mg/dL — AB
Specific Gravity, Urine: 1.017 (ref 1.005–1.030)
pH: 5 (ref 5.0–8.0)

## 2021-10-01 LAB — CBC WITH DIFFERENTIAL/PLATELET
Abs Immature Granulocytes: 0.03 10*3/uL (ref 0.00–0.07)
Basophils Absolute: 0 10*3/uL (ref 0.0–0.1)
Basophils Relative: 0 %
Eosinophils Absolute: 0.1 10*3/uL (ref 0.0–0.5)
Eosinophils Relative: 2 %
HCT: 47.6 % (ref 39.0–52.0)
Hemoglobin: 15.6 g/dL (ref 13.0–17.0)
Immature Granulocytes: 0 %
Lymphocytes Relative: 16 %
Lymphs Abs: 1.5 10*3/uL (ref 0.7–4.0)
MCH: 30.6 pg (ref 26.0–34.0)
MCHC: 32.8 g/dL (ref 30.0–36.0)
MCV: 93.5 fL (ref 80.0–100.0)
Monocytes Absolute: 0.7 10*3/uL (ref 0.1–1.0)
Monocytes Relative: 8 %
Neutro Abs: 6.7 10*3/uL (ref 1.7–7.7)
Neutrophils Relative %: 74 %
Platelets: 163 10*3/uL (ref 150–400)
RBC: 5.09 MIL/uL (ref 4.22–5.81)
RDW: 13.9 % (ref 11.5–15.5)
WBC: 9 10*3/uL (ref 4.0–10.5)
nRBC: 0 % (ref 0.0–0.2)

## 2021-10-01 MED ORDER — SODIUM CHLORIDE 0.9 % IV BOLUS
500.0000 mL | Freq: Once | INTRAVENOUS | Status: AC
  Administered 2021-10-01: 500 mL via INTRAVENOUS

## 2021-10-01 MED ORDER — IOHEXOL 9 MG/ML PO SOLN
ORAL | Status: AC
  Administered 2021-10-01: 500 mL
  Filled 2021-10-01: qty 1000

## 2021-10-01 NOTE — ED Provider Notes (Signed)
?[facility] ?Provider Note ? ? ?CSN: [id number] ?Arrival date & time: 10/01/21  0705 ? ?  ? ?History ? ?Chief Complaint  ?Patient presents with  ? right quadrant pain  ? Constipation  ? ? ?[name] is a 88 y.o. male. ? ?HPI ?Patient presents with his wife who assists with the history.  He presents due to pain in the right lower quadrant.  He notes a history of constipation, requiring ED evaluation about 1 month ago, but since that time he has been having more normal bowel movements.  In spite of taking his new medication regimen, over the past 2 days he has developed pain in the right lower quadrant, just anterior to the anterior superior iliac crest.  No relief with Tylenol. ? ?  ? ?Home Medications ?Prior to Admission medications   ?Medication Sig Start Date End Date Taking? Authorizing Provider  ?acetaminophen (TYLENOL) 500 MG tablet Take 500 mg by mouth 2 (two) times daily.    [provider]  ?aspirin 81 MG tablet Take 81 mg by mouth every evening.     [provider]  ?BD VEO INSULIN SYRINGE U/F 31G X 15/64" 1 ML MISC USE TO [place] DAILY 04/01/19   [provider]  ?bisacodyl (DULCOLAX) 5 MG EC tablet 3 tablets as needed    [provider]  ?calcitRIOL (ROCALTROL) 0.25 MCG capsule Take 0.25 mcg by mouth every Monday, Wednesday, and Friday. evening    [provider]  ?clopidogrel (PLAVIX) 75 MG tablet Take 1 tablet by mouth once daily 08/08/21   Croitoru, Dani Gobble, MD  ?Emollient (UDDERLY SMOOTH EX) Apply 1 application. topically daily. Apply to both feet    [provider]  ?famotidine (PEPCID) 20 MG tablet Take 1 tablet (20 mg total) by mouth daily. 05/02/21   Mercy Riding, MD  ?furosemide (LASIX) 20 MG tablet Take 1 tablet (20 mg total) by mouth every evening. 05/06/21   Mercy Riding, MD  ?insulin NPH-regular Human (70-30) 100 UNIT/ML injection Inject 50 Units into the skin 2 (two) times  daily with a meal. 05/02/21   Mercy Riding, MD  ?irbesartan (AVAPRO) 150 MG tablet Take 1 tablet (150 mg total) by mouth daily. 05/06/21   Mercy Riding, MD  ?levothyroxine (SYNTHROID, LEVOTHROID) 25 MCG tablet Take 25 mcg by mouth daily.  02/09/14   [provider]  ?memantine (NAMENDA) 10 MG tablet Take 1 tablet (10 mg total) by mouth 2 (two) times daily. 07/21/21   Rondel Jumbo, PA-C  ?metoprolol succinate (TOPROL-XL) 25 MG 24 hr tablet TAKE 1 TABLET BY MOUTH ONCE DAILY . APPOINTMENT REQUIRED FOR FUTURE REFILLS 09/22/21   Croitoru, Dani Gobble, MD  ?Multiple Vitamin (RENAL MULTIVITAMIN/ZINC PO)     [provider]  ?nitroGLYCERIN (NITROSTAT) 0.4 MG SL tablet Place 1 tablet (0.4 mg total) under the tongue every 5 (five) minutes as needed for chest pain. 12/27/18   Croitoru, Dani Gobble, MD  ?Donald Siva test strip  05/10/19   [provider]  ?OVER THE COUNTER MEDICATION Take 1 capsule by mouth every evening. OTC vitamin for patients with kidney issues    [provider]  ?polyethylene glycol (MIRALAX / GLYCOLAX) 17 g packet Take 17 g by mouth daily. 08/25/21   Charlesetta Shanks, MD  ?rosuvastatin (CRESTOR) 40 MG tablet Take 1 tablet by mouth once daily 08/08/21   Croitoru, Dani Gobble, MD  ?   ? ?Allergies    ?Patient  has no known allergies.   ? ?Review of Systems   ?Review of Systems  ?All other systems reviewed and are negative. ? ?Physical Exam ?Updated Vital Signs ?BP 137/74   Pulse 65   Temp 98.8 ?F (37.1 ?C)   Resp 17   SpO2 96%  ?Physical Exam ?Vitals and nursing note reviewed.  ?Constitutional:   ?   General: He is not in acute distress. ?   Appearance: He is well-developed.  ?HENT:  ?   Head: Normocephalic and atraumatic.  ?Eyes:  ?   Conjunctiva/sclera: Conjunctivae normal.  ?Cardiovascular:  ?   Rate and Rhythm: Normal rate and regular rhythm.  ?Pulmonary:  ?   Effort: Pulmonary effort is normal. No respiratory distress.  ?   Breath sounds: No stridor.  ?Abdominal:  ?   General: There  is no distension.  ?   Tenderness: There is abdominal tenderness.  ?Skin: ?   General: Skin is warm and dry.  ?Neurological:  ?   Mental Status: He is alert and oriented to person, place, and time.  ? ? ?ED Results / Procedures / Treatments   ?Labs ?(all labs ordered are listed, but only abnormal results are displayed) ?Labs Reviewed  ?COMPREHENSIVE METABOLIC PANEL - Abnormal; Notable for the following components:  ?    Result Value  ? Glucose, Bld 244 (*)   ? BUN 33 (*)   ? Creatinine, Ser 2.06 (*)   ? Albumin 3.4 (*)   ? GFR, Estimated 31 (*)   ? All other components within normal limits  ?URINALYSIS, ROUTINE W REFLEX MICROSCOPIC - Abnormal; Notable for the following components:  ? Glucose, UA 50 (*)   ? Protein, ur 30 (*)   ? All other components within normal limits  ?CBC WITH DIFFERENTIAL/PLATELET  ? ? ?EKG ?None ? ?Radiology ?DG Abdomen Acute W/Chest ? ?Result Date: 10/01/2021 ?CLINICAL DATA:  Constipation. Right lower quadrant abdominal pain for 1 month. EXAM: DG ABDOMEN ACUTE WITH 1 VIEW CHEST COMPARISON:  08/25/2021 FINDINGS: There is no evidence of dilated bowel loops or free intraperitoneal air. Moderate retained stool noted within the colon. Seed implants identified within the prostate gland. gallstones identified within the right upper quadrant of the abdomen. Right chest wall pacer device noted with leads in the right atrium and right ventricle. Low lung volumes. No airspace opacity, signs of pleural effusion or interstitial edema. IMPRESSION: 1. Nonobstructive bowel gas pattern. 2. Moderate retained stool burden within the colon which may reflect constipation. 3. Cholelithiasis. Electronically Signed   By: Kerby Moors M.D.   On: 10/01/2021 09:27   ? ?Procedures ?Procedures  ? ? ?Medications Ordered in ED ?Medications  ?sodium chloride 0.9 % bolus 500 mL (500 mLs Intravenous New Bag/Given 10/01/21 1412)  ?iohexol (OMNIPAQUE) 9 MG/ML oral solution (500 mLs  Contrast Given 10/01/21 1413)  ? ? ?ED Course/  Medical Decision Making/ A&P ?This patient with a Hx of constipation, requiring disimpaction, enema about 1 month ago presents to the ED for concern of lower quadrant pain, this involves an extensive number of treatment options, and is a complaint that carries with it a high risk of complications and morbidity.   ? ?The differential diagnosis includes obstruction, appendicitis, musculoskeletal etiology, less likely perforation given the absence of diffuse peritonitis ? ? ?Social Determinants of Health: ? ?Obesity, age ? ?Additional history obtained: ? ?Additional history and/or information obtained from wife at bedside, notable for HPI ? ? ?After the initial evaluation, orders, including: CT fluids labs  were initiated. ? ? ?Patient placed on Cardiac and Pulse-Oximetry Monitors. ?The patient was maintained on a cardiac monitor.  The cardiac monitored showed an rhythm of 75 sinus normal ?The patient was also maintained on pulse oximetry. The readings were typically 100% room air normal ? ? ?This elderly male presents with abdominal pain, in the context of recent episode of constipation requiring enema, now with good compliance with his bowel movement regimen.  Patient has a soft abdomen, but tenderness throughout the right lower quadrant.  Vital signs reassuring, patient awaiting labs, CT scan on signout.  Dr. Darl Householder is aware. ? ?Final Clinical Impression(s) / ED Diagnoses ?Final diagnoses:  ?Right lower quadrant abdominal pain  ? ?  ?Carmin Muskrat, MD ?10/01/21 1518 ? ?

## 2021-10-01 NOTE — ED Triage Notes (Signed)
Pt. Stated, Ive had right quadrant pain for a month , its worse when I move. Sometimes its due to constipation. Last BM was 3 days ago. ?

## 2021-10-01 NOTE — ED Provider Triage Note (Signed)
Emergency Medicine Provider Triage Evaluation Note ? ?[name] , a 88 y.o. male  was evaluated in triage.  Pt complains of right flank pain has been ongoing for a month.  It is worse with movement.  He does report associated constipation.  Last bowel movement has been approximately 3 to 4 days ago.  Patient states he is having increased amount of urinary frequency.  No fever or chills. ? ?Review of Systems  ?Positive:  ?Negative: See above  ? ?Physical Exam  ?BP 140/65   Pulse 77   Temp 99.1 ?F (37.3 ?C) (Oral)   Resp 18   SpO2 91%  ?Gen:   Awake, no distress   ?Resp:  Normal effort  ?MSK:   Moves extremities without difficulty  ?Other:  Point tenderness over the right flank area/lateral abdominal wall.  No abdominal tenderness. ? ?Medical Decision Making  ?Medically screening exam initiated at 7:39 AM.  Appropriate orders placed.  [name] was informed that the remainder of the evaluation will be completed by another provider, this initial triage assessment does not replace that evaluation, and the importance of remaining in the ED until their evaluation is complete. ? ? ?  ?Myna Bright Wells, PA-C ?10/01/21 5456 ? ?

## 2021-10-01 NOTE — ED Provider Notes (Signed)
?  Physical Exam  ?BP 137/74   Pulse 65   Temp 98.8 ?F (37.1 ?C)   Resp 17   SpO2 96%  ? ?Physical Exam ? ?Procedures  ?Procedures ? ?ED Course / MDM  ?  ?Medical Decision Making ?Care assumed at 3 PM.  Patient is here with abdominal pain and constipation.  Signed out pending CT abdomen pelvis ? ?5:19 PM ?I reviewed patient's labs and imaging independently.  CT abdomen pelvis did not show any obstruction.  Labs showed baseline creatinine of 2.0.  Patient is feeling better.  Stable for discharge.  Will refer to [facility] for follow-up ? ?Problems Addressed: ?Constipation, unspecified constipation type: acute illness or injury ?Right lower quadrant abdominal pain: acute illness or injury ? ?Amount and/or Complexity of Data Reviewed ?Labs: ordered. Decision-making details documented in ED Course. ?Radiology: ordered and independent interpretation performed. Decision-making details documented in ED Course. ? ? ? ? ? ? ? ?  ?Drenda Freeze, MD ?10/01/21 1720 ? ?

## 2021-10-01 NOTE — Discharge Instructions (Signed)
Continue your current bowel regimen until you see [facility] for follow-up. ? ?See your primary care doctor as well ? ?Return to ER if you have worse abdominal pain, vomiting, fever, no bowel movement for a week ?

## 2021-10-01 NOTE — ED Notes (Signed)
Pt's Spo2 noted be to be 87% RA, this writer placed the pt on 2L [place]. Spo2 sats are now 97%. EDP aware.  ?

## 2021-10-03 ENCOUNTER — Encounter: Payer: Self-pay | Admitting: Cardiovascular Disease

## 2021-10-03 ENCOUNTER — Ambulatory Visit (INDEPENDENT_AMBULATORY_CARE_PROVIDER_SITE_OTHER): Payer: Medicare Other | Admitting: Cardiovascular Disease

## 2021-10-03 VITALS — BP 128/68 | HR 94 | Ht 68.0 in | Wt 248.4 lb

## 2021-10-03 DIAGNOSIS — I442 Atrioventricular block, complete: Secondary | ICD-10-CM | POA: Diagnosis not present

## 2021-10-03 DIAGNOSIS — G4733 Obstructive sleep apnea (adult) (pediatric): Secondary | ICD-10-CM

## 2021-10-03 DIAGNOSIS — I5042 Chronic combined systolic (congestive) and diastolic (congestive) heart failure: Secondary | ICD-10-CM

## 2021-10-03 DIAGNOSIS — Z8673 Personal history of transient ischemic attack (TIA), and cerebral infarction without residual deficits: Secondary | ICD-10-CM | POA: Diagnosis not present

## 2021-10-03 DIAGNOSIS — I2510 Atherosclerotic heart disease of native coronary artery without angina pectoris: Secondary | ICD-10-CM | POA: Diagnosis not present

## 2021-10-03 DIAGNOSIS — E1122 Type 2 diabetes mellitus with diabetic chronic kidney disease: Secondary | ICD-10-CM

## 2021-10-03 DIAGNOSIS — Z794 Long term (current) use of insulin: Secondary | ICD-10-CM

## 2021-10-03 DIAGNOSIS — E782 Mixed hyperlipidemia: Secondary | ICD-10-CM | POA: Diagnosis not present

## 2021-10-03 DIAGNOSIS — I10 Essential (primary) hypertension: Secondary | ICD-10-CM | POA: Diagnosis not present

## 2021-10-03 DIAGNOSIS — I495 Sick sinus syndrome: Secondary | ICD-10-CM

## 2021-10-03 DIAGNOSIS — N184 Chronic kidney disease, stage 4 (severe): Secondary | ICD-10-CM

## 2021-10-03 DIAGNOSIS — Z950 Presence of cardiac pacemaker: Secondary | ICD-10-CM | POA: Diagnosis not present

## 2021-10-03 MED ORDER — FUROSEMIDE 20 MG PO TABS: 20.0000 mg | ORAL_TABLET | Freq: Every day | ORAL | 1 refills | Status: DC | PRN

## 2021-10-03 NOTE — Patient Instructions (Signed)
Medication Instructions:  ?FUROSEMIDE: Change to 20 mg once daily as needed for swelling.  ? ?*If you need a refill on your cardiac medications before your next appointment, please call your pharmacy* ? ? ?Lab Work: ?None ordered ?If you have labs (blood work) drawn today and your tests are completely normal, you will receive your results only by: ?MyChart Message (if you have MyChart) OR ?A paper copy in the mail ?If you have any lab test that is abnormal or we need to change your treatment, we will call you to review the results. ? ? ?Testing/Procedures: ?None ordered ? ? ?Follow-Up: ?At [facility], you and your health needs are our priority.  As part of our continuing mission to provide you with exceptional heart care, we have created designated Provider Care Teams.  These Care Teams include your primary Cardiologist (physician) and Advanced Practice Providers (APPs -  Physician Assistants and Nurse Practitioners) who all work together to provide you with the care you need, when you need it. ? ?We recommend signing up for the patient portal called "MyChart".  Sign up information is provided on this After Visit Summary.  MyChart is used to connect with patients for Virtual Visits (Telemedicine).  Patients are able to view lab/test results, encounter notes, upcoming appointments, etc.  Non-urgent messages can be sent to your provider as well.   ?To learn more about what you can do with MyChart, go to [email or web address].   ? ?Your next appointment:   ?12 month(s) ? ?The format for your next appointment:   ?In Person ? ?Provider:   ?Sanda Klein, MD { ? ? ?Important Information About Sugar ? ? ? ? ? ? ?

## 2021-10-03 NOTE — Progress Notes (Signed)
? ?Cardiology Office Note   ? ?Date:  10/05/2021  ? ?ID:  [name], DOB [birth year], MRN [id number] ? ?PCP:  [name], MD  ?Cardiologist:   Sanda Klein, MD  ? ?No chief complaint on file. ? ? ?History of Present Illness:  ?[name] is a 88 y.o. male with a dual-chamber permanent pacemaker, implanted for sinus bradycardia and intermittent second degree AV block now complete heart block (St. Jude [employer]urity, implanted in 1990, with generator changes in 2002, 2011 and 2020), CAD with remote stent procedures to LAD and RCA, and chronic combined systolic and diastolic heart failure. Additional problems include minor nonobstructive coronary atherosclerosis, insulin requiring type 2 diabetes mellitus, hyperlipidemia, hypertension in the setting of morbid obesity, CKD stage IV. ? ?As before, his biggest complaints are related to severe low back pain and lower extremity neuropathy.  Also having a lot of issues with constipation.   ? ?Had a visit to the emergency room on 10/01/2021 with right lower quadrant pain.  CT of the abdomen and pelvis did not show any new serious abnormalities.  Labs were unchanged from baseline. ? ?He was hospitalized last December with altered mental status but CT did not show any new changes.  MRI not possible since his leads are not MRI conditional.  Echocardiogram showed grossly unchanged findings.  Appeared to resolve spontaneously.  He was mildly hypoglycemic on arrival and his dose of insulin was decreased. ? ?Very sedentary.  Walks around the house with a rolling walker.  Does develop shortness of breath when walking more than about 100 feet, but does not have angina.  Has not had any new neurological events.  Enjoys taking photographs of flowers.  Has not had problems with dyspnea at rest.  Able to sleep on his side, horizontal lead. ? ?Only takes his diuretic intermittently and has not noticed problems with swelling.   ? ?Pacemaker function is normal.  His current  generator implanted in 2020 has >7 years of estimated longevity (still using the original leads from [place]).  He has 16% atrial pacing and >99% ventricular pacing.  He has had 5 episodes of paroxysmal atrial tachycardia all in January, total 1'26", no true atrial fibrillation or ventricular tachycardia.  His paced QRS is very broad at about 168 ms. ? ?Most recent echo shows EF 40-45%, images are always very difficult. ? ?He is on clopidogrel following a stroke in July 2020, from which he has recovered without sequelae. He has a 40-59% L ICA stenosis (60% by CTA). ? ?He underwent staged LAD and RCA revascularization with drug-eluting stents in 2005 and has not required subsequent percutaneous revascularization procedures. His most recent nuclear study in January 2014 showed an EF of 34% with a fixed inferior defect and no reversible ischemia. Most recent echo LVEF 40-45%.  He has moderate to severe chronic kidney disease, likely secondary to hypertensive/diabetic nephropathy. ? 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 He is now retired and is an avid Geophysicist/field seismologist. Now mostly photographing flowers. ? ?Past Medical History:  ?Diagnosis Date  ? Abnormal Doppler ultrasound of carotid artery 12/16/2012  ? mildly abnormal doppler . no prior studies to compare to. follow up studies are recommended when clinically indicated.  ? Acute respiratory failure with hypoxia ([place]) 11/2018  ? Blepharitis   ? Cancer [facility])   ? Carotid stenosis, left 11/2018  ? on left 60%  ? Diabetes mellitus without complication ([place])   ?  Exogenous obesity   ? Gastric ulcer   ? GI bleed   ? History of stress test 06/06/2012  ? low risk scan. fixed inferior defect consistant with prior infarction with moderatly reduced EF. no appreciable evidence of ischemia. no significant change from previous study  ? Hx of echocardiogram 08-15-2012  ? EF  45-50 % very difficult study, severe LVH, pacemaker,  trace TR,mildly enlarged RV,; aorta mildly calcified  ? Pacemaker 2011  ? St. Jude Accent DDDR ; this is the third generator this pt. has had  ? Shingles   ? TIA (transient ischemic attack) 11/2018  ? vs stroke not seen on CT  ? UTI (lower urinary tract infection)   ? ? ?Past Surgical History:  ?Procedure Laterality Date  ? CARDIAC CATHETERIZATION  12-29-2003  ? rt. coronary angiography was done with a 6 french 4 cm taper coronary cath. This demonstrated  approximately 70 to 75% mildly segmental stenosis in the mid RCA before the acute margin          ? CARDIAC CATHETERIZATION  11-19-2003  ? diagnostic cath,, pt. to get second cath to have stent placed  ? cardiac stents    ? CATARACT EXTRACTION    ? KNEE SURGERY Right   ? PPM GENERATOR CHANGEOUT N/A 04/07/2019  ? Procedure: PPM GENERATOR CHANGEOUT;  Surgeon: Sanda Klein, MD;  Location: [facility];  Service: Cardiovascular;  Laterality: N/A;  ? PROSTATE BIOPSY    ? prostate seed implants    ? SPINE SURGERY    ? WISDOM TOOTH EXTRACTION    ? ? ?Current Medications: ?Outpatient Medications Prior to Visit  ?Medication Sig Dispense Refill  ? acetaminophen (TYLENOL) 500 MG tablet Take 500 mg by mouth 2 (two) times daily.    ? aspirin 81 MG tablet Take 81 mg by mouth every evening.     ? BD VEO INSULIN SYRINGE U/F 31G X 15/64" 1 ML MISC USE TO ADMINSTER INSULIN SYRINGE TWICE DAILY    ? bisacodyl (DULCOLAX) 5 MG EC tablet 3 tablets as needed    ? calcitRIOL (ROCALTROL) 0.25 MCG capsule Take 0.25 mcg by mouth every Monday, Wednesday, and Friday. evening    ? clopidogrel (PLAVIX) 75 MG tablet Take 1 tablet (75 mg total) by mouth daily. 90 tablet 0  ? Coenzyme Q10 (CO Q 10) 100 MG CAPS Take 100 mg by mouth daily in the afternoon.    ? Continuous Blood Gluc Sensor (FREESTYLE LIBRE 3 SENSOR) MISC ON INSULIN TWICE A DAY. CHANGE SENSOR EVERY 14 DAYS    ? Continuous Blood Gluc Sensor (FREESTYLE LIBRE 3 SENSOR) MISC     ? Emollient (UDDERLY SMOOTH EX) Apply 1 application.  topically daily. Apply to both feet    ? famotidine (PEPCID) 20 MG tablet Take 1 tablet (20 mg total) by mouth daily. 30 tablet 1  ? famotidine (PEPCID) 40 MG tablet Take 40 mg by mouth at bedtime.    ? insulin NPH-regular Human (70-30) 100 UNIT/ML injection Inject 50 Units into the skin 2 (two) times daily with a meal. 20 mL 1  ? levothyroxine (SYNTHROID, LEVOTHROID) 25 MCG tablet Take 25 mcg by mouth daily.     ? memantine (NAMENDA) 10 MG tablet Take 1 tablet (10 mg total) by mouth 2 (two) times daily. 60 tablet 11  ? Multiple Vitamin (RENAL MULTIVITAMIN/ZINC PO)     ? nitroGLYCERIN (NITROSTAT) 0.4 MG SL tablet Place 1 tablet (0.4 mg total) under the tongue every 5 (five)  minutes as needed for chest pain. 25 tablet 1  ? ONETOUCH ULTRA test strip     ? OVER THE COUNTER MEDICATION Take 1 capsule by mouth every evening. OTC vitamin for patients with kidney issues    ? polyethylene glycol (MIRALAX / GLYCOLAX) 17 g packet Take 17 g by mouth daily. 14 each 0  ? rosuvastatin (CRESTOR) 40 MG tablet Take 1 tablet (40 mg total) by mouth daily. 90 tablet 0  ? metoprolol succinate (TOPROL-XL) 25 MG 24 hr tablet TAKE 1 TABLET BY MOUTH ONCE DAILY . APPOINTMENT REQUIRED FOR FUTURE REFILLS 15 tablet 0  ? irbesartan (AVAPRO) 150 MG tablet Take 1 tablet (150 mg total) by mouth daily. (Patient not taking: Reported on 10/03/2021) 90 tablet 3  ? furosemide (LASIX) 20 MG tablet Take 1 tablet (20 mg total) by mouth every evening. (Patient not taking: Reported on 10/03/2021) 30 tablet 9  ? ?Facility-Administered Medications Prior to Visit  ?Medication Dose Route Frequency Provider Last Rate Last Admin  ? betamethasone acetate-betamethasone sodium phosphate (CELESTONE) injection 3 mg  3 mg Intramuscular Once Edrick Kins, DPM      ?  ? ?Allergies:   Patient has no known allergies.  ? ?Social History  ? ?Socioeconomic History  ? Marital status: Married  ?  Spouse name: Not on file  ? Number of children: 2  ? Years of education: 3  ?  Highest education level: Not on file  ?Occupational History  ? Not on file  ?Tobacco Use  ? Smoking status: Never  ? Smokeless tobacco: Former  ?Vaping Use  ? Vaping Use: Never used  ?Substance and Sexual Activity

## 2021-10-04 ENCOUNTER — Other Ambulatory Visit: Payer: Self-pay | Admitting: Cardiovascular Disease

## 2021-10-04 ENCOUNTER — Telehealth: Payer: Self-pay | Admitting: Cardiovascular Disease

## 2021-10-04 NOTE — Telephone Encounter (Signed)
?*  STAT* If patient is at the pharmacy, call can be transferred to refill team. ? ? ?1. Which medications need to be refilled? (please list name of each medication and dose if known) metoprolol succinate (TOPROL-XL) 25 MG 24 hr tablet ? ?2. Which pharmacy/location (including street and city if local pharmacy) is medication to be sent to? [place], [place] - [address] ? ?3. Do they need a 30 day or 90 day supply? 90 day ? ?Patient was seen on 10/03/21, he is out of medication.   ?

## 2021-10-04 NOTE — Telephone Encounter (Signed)
Medication has been sent in 

## 2021-10-05 ENCOUNTER — Ambulatory Visit: Payer: Medicare Other | Admitting: Podiatry

## 2021-10-10 NOTE — Progress Notes (Signed)
Remote pacemaker transmission.   

## 2021-10-12 DIAGNOSIS — R5381 Other malaise: Secondary | ICD-10-CM | POA: Diagnosis not present

## 2021-10-12 DIAGNOSIS — K5909 Other constipation: Secondary | ICD-10-CM | POA: Diagnosis not present

## 2021-10-14 DIAGNOSIS — N184 Chronic kidney disease, stage 4 (severe): Secondary | ICD-10-CM | POA: Diagnosis not present

## 2021-10-14 DIAGNOSIS — E039 Hypothyroidism, unspecified: Secondary | ICD-10-CM | POA: Diagnosis not present

## 2021-10-14 DIAGNOSIS — Z794 Long term (current) use of insulin: Secondary | ICD-10-CM | POA: Diagnosis not present

## 2021-10-14 DIAGNOSIS — K5900 Constipation, unspecified: Secondary | ICD-10-CM | POA: Diagnosis not present

## 2021-10-14 DIAGNOSIS — E1142 Type 2 diabetes mellitus with diabetic polyneuropathy: Secondary | ICD-10-CM | POA: Diagnosis not present

## 2021-10-21 ENCOUNTER — Encounter: Payer: Self-pay | Admitting: Physician Assistant

## 2021-10-21 ENCOUNTER — Ambulatory Visit (INDEPENDENT_AMBULATORY_CARE_PROVIDER_SITE_OTHER): Payer: Medicare Other | Admitting: Physician Assistant

## 2021-10-21 VITALS — BP 176/67 | HR 81 | Resp 20 | Ht 68.0 in | Wt 232.0 lb

## 2021-10-21 DIAGNOSIS — I2510 Atherosclerotic heart disease of native coronary artery without angina pectoris: Secondary | ICD-10-CM

## 2021-10-21 DIAGNOSIS — F0150 Vascular dementia without behavioral disturbance: Secondary | ICD-10-CM | POA: Diagnosis not present

## 2021-10-21 DIAGNOSIS — F0280 Dementia in other diseases classified elsewhere without behavioral disturbance: Secondary | ICD-10-CM | POA: Diagnosis not present

## 2021-10-21 DIAGNOSIS — G309 Alzheimer's disease, unspecified: Secondary | ICD-10-CM | POA: Diagnosis not present

## 2021-10-21 NOTE — Progress Notes (Unsigned)
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  CT scan of the head on 05/02/2021 was remarkable for significant atrophy with extensive chronic microvascular ischemic changes.  He is scheduled for neurocognitive testing later this year.  He is overall stable from the cognitive standpoint.  He is currently on memantine 10 mg daily (due to CKD).    Recommendations:    Continue  memantine 10 mg daily (CKD)   Side effects were discussed Control mood by PCP. Keep appointment for neurocognitive testing Follow up as scheduled in November 2023  Case discussed with Dr. Delice Lesch who agrees with the plan    Subjective:    [name] is a very pleasant 88 y.o. RH male  seen today in follow up for memory loss. This patient is accompanied in the office by his daughter-in-law who supplements the history.  Previous records as well as any outside records available were reviewed prior to todays visit.  Patient was last seen at our office on 07/21/2021 at which time his MoCA was 20/30.  Patient is on memantine 10 mg daily (per pharmacy recommendation, patient has a history of CKD stage IV).   Any changes in memory since last visit? No major changes, STM is the issue, tries to exercise the memory with a tally and (native language) crossword puzzles, repeating to himself words, so that he can remember them. Patient lives with: Spouse and his daughter lives next door  repeats oneself?  "Not as much " Disoriented when walking into a room?  Patient denies   Leaving objects in unusual places?  Patient denies   Ambulates  with  difficulty?   Patient has a history of arthritis, as well as right shoulder pain due to arthritis, making ambulation more challenging.  He has stopped physical therapy because he was somewhat unsteady on his feet, but he is starting chair to exercises Recent falls?  Patient denies   Any head injuries?  Patient denies   History of seizures?   Patient denies   Wandering behavior?  Patient denies   Patient drives?   Patient no longer drives  Any mood changes such irritability agitation?  Patient denies   Any history of depression?:  Patient denies   Hallucinations?  Patient denies   Paranoia?  Patient denies   Patient reports that he sleeps well without vivid dreams, REM behavior or sleepwalking    History of sleep apnea?  Patient denies   Any hygiene concerns?  Patient denies   Independent of bathing and dressing?  Endorsed  Does the patient needs help with medications?  pillbox pill pack  Patient denies   Who is in charge of the finances?  Wife is in charge because she is an Optometrist, I will leave it up to her ". Any changes in appetite?  Patient denies   Patient have trouble swallowing? Patient denies   Does the patient cook?  Patient denies   Any kitchen accidents such as leaving the stove on? Patient denies   Any headaches?  Patient denies   The double vision? Patient denies   Any focal numbness or tingling?  Patient denies   Chronic back pain Patient  denies   Unilateral weakness?  Patient denies   Any tremors?  Patient denies   Any history of anosmia?  Patient denies   Any incontinence of urine?  Patient denies   Any bowel dysfunction?   Patient denies.  He had been seen at the emergency department with fecal impaction a few months back, but this is now well regulated and no new issues were noted.   Initial visit 07/2021 The patient is seen in neurologic consultation at the request of [name], MD for the evaluation of memory.  The patient is accompanied by his  daughter-in-law who supplements the history.  His wife is on the phone to participate in supplement information. This is a 88 y.o. year old RH  male originally from [place] who initially reported having memory issues since December 2022, when he presented to the ED, with 4 day history of confusion.  Work-up was suspicious for hypoglycemia, versus TIA versus dementia.  After discharge, he was seen by his PCP, who referred him here for further evaluation.  After questioning, he reports that "the memory problems may have been longer than that, when I was in [place], I could not even remember [place] words which obviously I was fluent in ".  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  He used to exhibit his photos at [employer], and some of them are not familiar to him.  He finds himself repeating his questions and telling the same stories.  Sometimes he cannot remember long-term information, such as his sister's birthday.  He needs more time to think the answers than before.  He denies being disoriented when walking into a room.  He denies leaving objects in unusual places.  He ambulates with some difficulty, he has a right frozen shoulder, and has some balance issues.  He denies any head injuries or recent falls.  [name] does the driving, as the patient has "several small strokes and diabetes, and when the low sugar comes, he shakes and closes worry".  The patient lives with his wife and his daughter-in-law who noticed the same changes.  His mood is overall stable, but lately, he has been more preoccupied, occasionally double and triple checking his wallet for example.  He denies depression, although he finds himself bored, seems he is computer has not been working for a few days, and "I am lost without it, waiting until he gets repaired ".  He does report some irritability due to the frustration of not  being able to remember as before. He does not sleep well, or feels rested, because of his right frozen shoulder, which causes discomfort when he is moving in the bed.  He tried PT in the past, "it never worked ".  He denies vivid dreams, occasionally he may have a nightmare, denies sleepwalking, hallucinations or paranoia.  He denies any hygiene concerns, he needs help with bathing and dressing because of the shoulder.  He has been always meticulous about his medications, but his daughter-in-law helps him monitor her.  He is wife is an Optometrist, and has always been in charge of the finances.  His appetite is somewhat decreased "he is less enthusiastic about food lately because of the situation".  He denies trouble swallowing.  He admits to not drinking enough water.  He does not cook.  He denies any significant headaches, occasionally he may have a migraine  which resolves quickly.  He denies double vision, dizziness, focal numbness, or tingling.  He has a right frozen shoulder as mentioned before, denies tremors or anosmia.  No history of seizures.  Denies urine incontinence, retention, he does have issues with constipation, denies diarrhea.  20 years ago, he was diagnosed with OSA, but he never wanted to use his CPAP.  He denies alcohol or tobacco.  Family history of 1 aunt with Alzheimer's disease   Pertinent labs A1c 7.5 (high), LDL 77 TSH 2.91, vitamin B12 311   CT of the head without contrast 04/22/2021 No acute intracranial process.. 2. Atrophy with extensive chronic microvascular ischemic changes.3. Chronic right maxillary sinus disease.   PREVIOUS MEDICATIONS:   CURRENT MEDICATIONS:  Outpatient Encounter Medications as of 10/21/2021  Medication Sig   acetaminophen (TYLENOL) 500 MG tablet Take 500 mg by mouth 2 (two) times daily.   aspirin 81 MG tablet Take 81 mg by mouth every evening.    BD VEO INSULIN SYRINGE U/F 31G X 15/64" 1 ML MISC USE TO ADMINSTER INSULIN SYRINGE TWICE DAILY   bisacodyl  (DULCOLAX) 5 MG EC tablet 3 tablets as needed   calcitRIOL (ROCALTROL) 0.25 MCG capsule Take 0.25 mcg by mouth every Monday, Wednesday, and Friday. evening   clopidogrel (PLAVIX) 75 MG tablet Take 1 tablet (75 mg total) by mouth daily.   Coenzyme Q10 (CO Q 10) 100 MG CAPS Take 100 mg by mouth daily in the afternoon.   Continuous Blood Gluc Sensor (FREESTYLE LIBRE 3 SENSOR) MISC ON INSULIN TWICE A DAY. CHANGE SENSOR EVERY 14 DAYS   Continuous Blood Gluc Sensor (FREESTYLE LIBRE 3 SENSOR) MISC    Emollient (UDDERLY SMOOTH EX) Apply 1 application. topically daily. Apply to both feet   famotidine (PEPCID) 20 MG tablet Take 1 tablet (20 mg total) by mouth daily.   famotidine (PEPCID) 40 MG tablet Take 40 mg by mouth at bedtime.   furosemide (LASIX) 20 MG tablet Take 1 tablet (20 mg total) by mouth daily as needed for edema.   insulin NPH-regular Human (70-30) 100 UNIT/ML injection Inject 50 Units into the skin 2 (two) times daily with a meal.   irbesartan (AVAPRO) 150 MG tablet Take 1 tablet (150 mg total) by mouth daily.   levothyroxine (SYNTHROID, LEVOTHROID) 25 MCG tablet Take 25 mcg by mouth daily.    memantine (NAMENDA) 10 MG tablet Take 1 tablet (10 mg total) by mouth 2 (two) times daily.   metoprolol succinate (TOPROL-XL) 25 MG 24 hr tablet Take 1 tablet (25 mg total) by mouth daily.   Multiple Vitamin (RENAL MULTIVITAMIN/ZINC PO)    nitroGLYCERIN (NITROSTAT) 0.4 MG SL tablet Place 1 tablet (0.4 mg total) under the tongue every 5 (five) minutes as needed for chest pain.   ONETOUCH ULTRA test strip    OVER THE COUNTER MEDICATION Take 1 capsule by mouth every evening. OTC vitamin for patients with kidney issues   polyethylene glycol (MIRALAX / GLYCOLAX) 17 g packet Take 17 g by mouth daily.   rosuvastatin (CRESTOR) 40 MG tablet Take 1 tablet (40 mg total) by mouth daily.   Facility-Administered Encounter Medications as of 10/21/2021  Medication   betamethasone acetate-betamethasone sodium  phosphate (CELESTONE) injection 3 mg        View : No data to display.            07/22/2021    7:00 AM  Montreal Cognitive Assessment   Visuospatial/ Executive (0/5) 4  Naming (0/3) 2  Attention:  Read list of digits (0/2) 1  Attention: Read list of letters (0/1) 1  Attention: Serial 7 subtraction starting at 100 (0/3) 2  Language: Repeat phrase (0/2) 2  Language : Fluency (0/1) 0  Abstraction (0/2) 2  Delayed Recall (0/5) 0  Orientation (0/6) 6  Total 20  Adjusted Score (based on education) 20    Objective:     PHYSICAL EXAMINATION:    VITALS:   Vitals:   10/21/21 1100  BP: (!) 176/67  Pulse: 81  Resp: 20  SpO2: 95%  Weight: 232 lb (105.2 kg)  Height: '5\' 8"'$  (1.727 m)    GEN:  The patient appears stated age and is in NAD. HEENT:  Normocephalic, atraumatic.   Neurological examination:  General: NAD, well-groomed, appears stated age. Orientation: The patient is alert. Oriented to person, place and date Cranial nerves: There is good facial symmetry.The speech is fluent and clear. No aphasia or dysarthria. Fund of knowledge is appropriate. Recent memory is impaired, remote memory intact.  Attention and concentration are normal.  Able to name objects and repeat phrases.  Delayed recall 0/3. Hearing is intact to conversational tone.    Sensation: Sensation is intact to light touch throughout Motor: Strength is at least antigravity x4. Tremors: none  DTR's 2/4 in UE/LE     Movement examination: Tone: There is normal tone in the UE/LE Abnormal movements:  no tremor.  No myoclonus.  No asterixis.   Coordination:  There is no decremation with RAM's. Normal finger to nose  Gait and Station: The patient has some difficulty arising from a deep chair, he is unable to heel toe walk without any problems.  He is unable to ambulate in a tandem fashion.  The patient's stride length is good.  Gait is cautious and narrow.    Thank you for allowing [place] the opportunity to  participate in the care of this nice patient. Please do not hesitate to contact us for any questions or concerns.   Total time spent on today's visit was *** minutes dedicated to this patient today, preparing to see patient, examining the patient, ordering tests and/or medications and counseling the patient, documenting clinical information in the EHR or other health record, independently interpreting results and communicating results to the patient/family, discussing treatment and goals, answering patient's questions and coordinating care.  Cc:  [name], MD  Sharene Butters 10/21/2021 1:03 PM

## 2021-10-21 NOTE — Patient Instructions (Addendum)
It was a pleasure to see you today at our office.   Recommendations:  Follow up  as scheduled on Nov 16  Keep the neurocognitive testing appointment  Continue Memantine 10 mg daily.    Whom to call:  Memory  decline, memory medications: Call our office [phone]   For psychiatric meds, mood meds: Please have your primary care physician manage these medications.   Counseling regarding caregiver distress, including caregiver depression, anxiety and issues regarding community resources, adult day care programs, adult living facilities, or memory care questions:   Feel free to contact [place], Social Worker at [phone]   For assessment of decision of mental capacity and competency:  Call Dr. Anthoney Harada, geriatric psychiatrist at [phone]  For guidance in geriatric dementia issues please call [facility] [phone]  For guidance regarding [facility] and if placement were needed at the facility, contact Arnell Asal, Social Worker tel: [phone]  If you have any severe symptoms of a stroke, or other severe issues such as confusion,severe chills or fever, etc call 911 or go to the ER as you may need to be evaluated further   Feel free to visit Facebook page " [email or web address]" for tips of how to care for people with memory problems.    RECOMMENDATIONS FOR ALL PATIENTS WITH MEMORY PROBLEMS: 1. Continue to exercise (Recommend 30 minutes of walking everyday, or 3 hours every week) 2. Increase social interactions - continue going to [place] and enjoy social gatherings with friends and family 3. Eat healthy, avoid fried foods and eat more fruits and vegetables 4. Maintain adequate blood pressure, blood sugar, and blood cholesterol level. Reducing the risk of stroke and cardiovascular disease also helps promoting better memory. 5. Avoid stressful situations. Live a simple life and avoid aggravations. Organize your time and prepare for the  next day in anticipation. 6. Sleep well, avoid any interruptions of sleep and avoid any distractions in the bedroom that may interfere with adequate sleep quality 7. Avoid sugar, avoid sweets as there is a strong link between excessive sugar intake, diabetes, and cognitive impairment We discussed the Mediterranean diet, which has been shown to help patients reduce the risk of progressive memory disorders and reduces cardiovascular risk. This includes eating fish, eat fruits and green leafy vegetables, nuts like almonds and hazelnuts, walnuts, and also use olive oil. Avoid fast foods and fried foods as much as possible. Avoid sweets and sugar as sugar use has been linked to worsening of memory function.  There is always a concern of gradual progression of memory problems. If this is the case, then we may need to adjust level of care according to patient needs. Support, both to the patient and caregiver, should then be put into place.    The Alzheimer's Association is here all day, every day for people facing Alzheimer's disease through our free 24/7 Helpline: 6361184787. The Helpline provides reliable information and support to all those who need assistance, such as individuals living with memory loss, Alzheimer's or other dementia, caregivers, health care professionals and the public.  Our highly trained and knowledgeable staff can help you with: Understanding memory loss, dementia and Alzheimer's  Medications and other treatment options  General information about aging and brain health  Skills to provide quality care and to find the best care from professionals  Legal, financial and living-arrangement decisions Our Helpline also features: Confidential care consultation provided by master's level clinicians who can help with decision-making support, crisis assistance and  education on issues families face every day  Help in a caller's preferred language using our translation service that features  more than 200 languages and dialects  Referrals to local community programs, services and ongoing support     FALL PRECAUTIONS: Be cautious when walking. Scan the area for obstacles that may increase the risk of trips and falls. When getting up in the mornings, sit up at the edge of the bed for a few minutes before getting out of bed. Consider elevating the bed at the head end to avoid drop of blood pressure when getting up. Walk always in a well-lit room (use night lights in the walls). Avoid area rugs or power cords from appliances in the middle of the walkways. Use a walker or a cane if necessary and consider physical therapy for balance exercise. Get your eyesight checked regularly.  FINANCIAL OVERSIGHT: Supervision, especially oversight when making financial decisions or transactions is also recommended.  HOME SAFETY: Consider the safety of the kitchen when operating appliances like stoves, microwave oven, and blender. Consider having supervision and share cooking responsibilities until no longer able to participate in those. Accidents with firearms and other hazards in the house should be identified and addressed as well.   ABILITY TO BE LEFT ALONE: If patient is unable to contact 911 operator, consider using LifeLine, or when the need is there, arrange for someone to stay with patients. Smoking is a fire hazard, consider supervision or cessation. Risk of wandering should be assessed by caregiver and if detected at any point, supervision and safe proof recommendations should be instituted.  MEDICATION SUPERVISION: Inability to self-administer medication needs to be constantly addressed. Implement a mechanism to ensure safe administration of the medications.        Mediterranean Diet A Mediterranean diet refers to food and lifestyle choices that are based on the traditions of countries located on the [employer]. This way of eating has been shown to help prevent certain conditions  and improve outcomes for people who have chronic diseases, like kidney disease and heart disease. What are tips for following this plan? Lifestyle  Cook and eat meals together with your family, when possible. Drink enough fluid to keep your urine clear or pale yellow. Be physically active every day. This includes: Aerobic exercise like running or swimming. Leisure activities like gardening, walking, or housework. Get 7-8 hours of sleep each night. If recommended by your health care provider, drink red wine in moderation. This means 1 glass a day for nonpregnant women and 2 glasses a day for men. A glass of wine equals 5 oz (150 mL). Reading food labels  Check the serving size of packaged foods. For foods such as rice and pasta, the serving size refers to the amount of cooked product, not dry. Check the total fat in packaged foods. Avoid foods that have saturated fat or trans fats. Check the ingredients list for added sugars, such as corn syrup. Shopping  At the grocery store, buy most of your food from the areas near the walls of the store. This includes: Fresh fruits and vegetables (produce). Grains, beans, nuts, and seeds. Some of these may be available in unpackaged forms or large amounts (in bulk). Fresh seafood. Poultry and eggs. Low-fat dairy products. Buy whole ingredients instead of prepackaged foods. Buy fresh fruits and vegetables in-season from local farmers markets. Buy frozen fruits and vegetables in resealable bags. If you do not have access to quality fresh seafood, buy precooked frozen shrimp or canned  fish, such as tuna, salmon, or sardines. Buy small amounts of raw or cooked vegetables, salads, or olives from the deli or salad bar at your store. Stock your pantry so you always have certain foods on hand, such as olive oil, canned tuna, canned tomatoes, rice, pasta, and beans. Cooking  Cook foods with extra-virgin olive oil instead of using butter or other vegetable  oils. Have meat as a side dish, and have vegetables or grains as your main dish. This means having meat in small portions or adding small amounts of meat to foods like pasta or stew. Use beans or vegetables instead of meat in common dishes like chili or lasagna. Experiment with different cooking methods. Try roasting or broiling vegetables instead of steaming or sauteing them. Add frozen vegetables to soups, stews, pasta, or rice. Add nuts or seeds for added healthy fat at each meal. You can add these to yogurt, salads, or vegetable dishes. Marinate fish or vegetables using olive oil, lemon juice, garlic, and fresh herbs. Meal planning  Plan to eat 1 vegetarian meal one day each week. Try to work up to 2 vegetarian meals, if possible. Eat seafood 2 or more times a week. Have healthy snacks readily available, such as: Vegetable sticks with hummus. Greek yogurt. Fruit and nut trail mix. Eat balanced meals throughout the week. This includes: Fruit: 2-3 servings a day Vegetables: 4-5 servings a day Low-fat dairy: 2 servings a day Fish, poultry, or lean meat: 1 serving a day Beans and legumes: 2 or more servings a week Nuts and seeds: 1-2 servings a day Whole grains: 6-8 servings a day Extra-virgin olive oil: 3-4 servings a day Limit red meat and sweets to only a few servings a month What are my food choices? Mediterranean diet Recommended Grains: Whole-grain pasta. Brown rice. Bulgar wheat. Polenta. Couscous. Whole-wheat bread. [place]. Vegetables: Artichokes. Beets. Broccoli. Cabbage. Carrots. Eggplant. Green beans. Chard. Kale. Spinach. Onions. Leeks. Peas. Squash. Tomatoes. Peppers. Radishes. Fruits: Apples. Apricots. Avocado. Berries. Bananas. Cherries. Dates. Figs. Grapes. Lemons. Melon. Oranges. Peaches. Plums. Pomegranate. Meats and other protein foods: Beans. Almonds. Sunflower seeds. Pine nuts. Peanuts. [place]. Salmon. Scallops. Shrimp. [place]. Tilapia. Clams. Oysters.  Eggs. Dairy: Low-fat milk. Cheese. Greek yogurt. Beverages: Water. Red wine. Herbal tea. Fats and oils: Extra virgin olive oil. Avocado oil. Grape seed oil. Sweets and desserts: [place] yogurt with honey. Baked apples. Poached pears. Trail mix. Seasoning and other foods: Basil. Cilantro. Coriander. Cumin. Mint. Parsley. Sage. Rosemary. Tarragon. Garlic. Oregano. Thyme. Pepper. Balsalmic vinegar. Tahini. Hummus. Tomato sauce. Olives. Mushrooms. Limit these Grains: Prepackaged pasta or rice dishes. Prepackaged cereal with added sugar. Vegetables: Deep fried potatoes (french fries). Fruits: Fruit canned in syrup. Meats and other protein foods: Beef. Pork. Lamb. Poultry with skin. Hot dogs. [place] Salines. Dairy: Ice cream. Sour cream. Whole milk. Beverages: Juice. Sugar-sweetened soft drinks. Beer. Liquor and spirits. Fats and oils: Butter. Canola oil. Vegetable oil. Beef fat (tallow). Lard. Sweets and desserts: Cookies. Cakes. Pies. Candy. Seasoning and other foods: Mayonnaise. Premade sauces and marinades. The items listed may not be a complete list. Talk with your dietitian about what dietary choices are right for you. Summary The Mediterranean diet includes both food and lifestyle choices. Eat a variety of fresh fruits and vegetables, beans, nuts, seeds, and whole grains. Limit the amount of red meat and sweets that you eat. Talk with your health care provider about whether it is safe for you to drink red wine in moderation. This means 1 glass a day  for nonpregnant women and 2 glasses a day for men. A glass of wine equals 5 oz (150 mL). This information is not intended to replace advice given to you by your health care provider. Make sure you discuss any questions you have with your health care provider. Document Released: 12/30/2015 Document Revised: 02/01/2016 Document Reviewed: 12/30/2015 Elsevier Interactive Patient Education  2017 [employer].

## 2021-11-01 ENCOUNTER — Encounter: Payer: Self-pay | Admitting: Podiatry

## 2021-11-01 ENCOUNTER — Ambulatory Visit (INDEPENDENT_AMBULATORY_CARE_PROVIDER_SITE_OTHER): Payer: Medicare Other | Admitting: Podiatry

## 2021-11-01 DIAGNOSIS — M79675 Pain in left toe(s): Secondary | ICD-10-CM

## 2021-11-01 DIAGNOSIS — F0150 Vascular dementia without behavioral disturbance: Secondary | ICD-10-CM

## 2021-11-01 DIAGNOSIS — M79674 Pain in right toe(s): Secondary | ICD-10-CM | POA: Diagnosis not present

## 2021-11-01 DIAGNOSIS — D689 Coagulation defect, unspecified: Secondary | ICD-10-CM

## 2021-11-01 DIAGNOSIS — B351 Tinea unguium: Secondary | ICD-10-CM

## 2021-11-01 DIAGNOSIS — F0280 Dementia in other diseases classified elsewhere without behavioral disturbance: Secondary | ICD-10-CM

## 2021-11-01 DIAGNOSIS — Z794 Long term (current) use of insulin: Secondary | ICD-10-CM

## 2021-11-01 DIAGNOSIS — E1140 Type 2 diabetes mellitus with diabetic neuropathy, unspecified: Secondary | ICD-10-CM

## 2021-11-01 DIAGNOSIS — G309 Alzheimer's disease, unspecified: Secondary | ICD-10-CM

## 2021-11-01 NOTE — Progress Notes (Signed)
This patient returns to my office for at risk foot care.  This patient requires this care by a professional since this patient will be at risk due to having diabetes and chronic kidney disease and coagulation defect.  Patient is taking plavix.  This patient is unable to cut nails himself since the patient cannot reach his nails.These nails are painful walking and wearing shoes.  This patient presents for at risk foot care today.  General Appearance  Alert, conversant and in no acute stress.  Vascular  Dorsalis pedis and posterior tibial  pulses are palpable  bilaterally.  Capillary return is within normal limits  bilaterally. Temperature is within normal limits  bilaterally.  Neurologic  Senn-Weinstein monofilament wire test within normal limits  bilaterally. Muscle power within normal limits bilaterally.  Nails Thick disfigured discolored nails with subungual debris  from hallux to fifth toes bilaterally. No evidence of bacterial infection or drainage bilaterally.  Orthopedic  No limitations of motion  feet .  No crepitus or effusions noted.  No bony pathology or digital deformities noted.  Skin  normotropic skin with no porokeratosis noted bilaterally.  No signs of infections or ulcers noted.   Clavi 3rd toe right foot.  Onychomycosis  Pain in right toes  Pain in left toes  Clavi right foot.  Consent was obtained for treatment procedures.   Mechanical debridement of nails 1-5  bilaterally performed with a nail nipper.  Filed with dremel without incident.  Debride clavi with # 15 blade.   Return office visit   3 months                  Told patient to return for periodic foot care and evaluation due to potential at risk complications.   Jarod Bozzo DPM  

## 2021-11-04 DIAGNOSIS — E1142 Type 2 diabetes mellitus with diabetic polyneuropathy: Secondary | ICD-10-CM | POA: Diagnosis not present

## 2021-11-04 DIAGNOSIS — N184 Chronic kidney disease, stage 4 (severe): Secondary | ICD-10-CM | POA: Diagnosis not present

## 2021-11-07 DIAGNOSIS — K5900 Constipation, unspecified: Secondary | ICD-10-CM | POA: Diagnosis not present

## 2021-12-01 DIAGNOSIS — I129 Hypertensive chronic kidney disease with stage 1 through stage 4 chronic kidney disease, or unspecified chronic kidney disease: Secondary | ICD-10-CM | POA: Diagnosis not present

## 2021-12-01 DIAGNOSIS — E1122 Type 2 diabetes mellitus with diabetic chronic kidney disease: Secondary | ICD-10-CM | POA: Diagnosis not present

## 2021-12-01 DIAGNOSIS — N184 Chronic kidney disease, stage 4 (severe): Secondary | ICD-10-CM | POA: Diagnosis not present

## 2021-12-01 DIAGNOSIS — I495 Sick sinus syndrome: Secondary | ICD-10-CM | POA: Diagnosis not present

## 2021-12-01 DIAGNOSIS — N2581 Secondary hyperparathyroidism of renal origin: Secondary | ICD-10-CM | POA: Diagnosis not present

## 2021-12-27 ENCOUNTER — Ambulatory Visit (INDEPENDENT_AMBULATORY_CARE_PROVIDER_SITE_OTHER): Payer: Medicare Other

## 2021-12-27 DIAGNOSIS — I495 Sick sinus syndrome: Secondary | ICD-10-CM | POA: Diagnosis not present

## 2021-12-27 LAB — CUP PACEART REMOTE DEVICE CHECK
Battery Remaining Longevity: 87 mo
Battery Remaining Percentage: 74 %
Battery Voltage: 2.99 V
Brady Statistic AP VP Percent: 16 %
Brady Statistic AP VS Percent: 1 %
Brady Statistic AS VP Percent: 84 %
Brady Statistic AS VS Percent: 1 %
Brady Statistic RA Percent Paced: 15 %
Brady Statistic RV Percent Paced: 99 %
Date Time Interrogation Session: 20230808040015
Implantable Lead Implant Date: 19901207
Implantable Lead Implant Date: 19901207
Implantable Lead Location: 753859
Implantable Lead Location: 753860
Implantable Pulse Generator Implant Date: 20201116
Lead Channel Impedance Value: 290 Ohm
Lead Channel Impedance Value: 540 Ohm
Lead Channel Pacing Threshold Amplitude: 1 V
Lead Channel Pacing Threshold Amplitude: 1 V
Lead Channel Pacing Threshold Pulse Width: 0.4 ms
Lead Channel Pacing Threshold Pulse Width: 0.4 ms
Lead Channel Sensing Intrinsic Amplitude: 3.1 mV
Lead Channel Sensing Intrinsic Amplitude: 4.7 mV
Lead Channel Setting Pacing Amplitude: 1.25 V
Lead Channel Setting Pacing Amplitude: 2 V
Lead Channel Setting Pacing Pulse Width: 0.4 ms
Lead Channel Setting Sensing Sensitivity: 3 mV
Pulse Gen Model: 2272
Pulse Gen Serial Number: 9177614

## 2022-01-02 ENCOUNTER — Other Ambulatory Visit: Payer: Self-pay | Admitting: Cardiovascular Disease

## 2022-01-03 ENCOUNTER — Encounter: Payer: Self-pay | Admitting: Psychology

## 2022-01-31 NOTE — Progress Notes (Signed)
Remote pacemaker transmission.   

## 2022-02-03 ENCOUNTER — Ambulatory Visit (INDEPENDENT_AMBULATORY_CARE_PROVIDER_SITE_OTHER): Payer: Medicare Other | Admitting: Podiatry

## 2022-02-03 ENCOUNTER — Encounter: Payer: Self-pay | Admitting: Podiatry

## 2022-02-03 DIAGNOSIS — M79675 Pain in left toe(s): Secondary | ICD-10-CM

## 2022-02-03 DIAGNOSIS — D689 Coagulation defect, unspecified: Secondary | ICD-10-CM

## 2022-02-03 DIAGNOSIS — M79674 Pain in right toe(s): Secondary | ICD-10-CM | POA: Diagnosis not present

## 2022-02-03 DIAGNOSIS — B351 Tinea unguium: Secondary | ICD-10-CM

## 2022-02-03 DIAGNOSIS — Z794 Long term (current) use of insulin: Secondary | ICD-10-CM

## 2022-02-03 DIAGNOSIS — E1140 Type 2 diabetes mellitus with diabetic neuropathy, unspecified: Secondary | ICD-10-CM

## 2022-02-03 NOTE — Progress Notes (Signed)
This patient returns to my office for at risk foot care.  This patient requires this care by a professional since this patient will be at risk due to having diabetes and chronic kidney disease and coagulation defect.  Patient is taking plavix.  This patient is unable to cut nails himself since the patient cannot reach his nails.These nails are painful walking and wearing shoes.  This patient presents for at risk foot care today.  General Appearance  Alert, conversant and in no acute stress.  Vascular  Dorsalis pedis and posterior tibial  pulses are palpable  bilaterally.  Capillary return is within normal limits  bilaterally. Temperature is within normal limits  bilaterally.  Neurologic  Senn-Weinstein monofilament wire test within normal limits  bilaterally. Muscle power within normal limits bilaterally.  Nails Thick disfigured discolored nails with subungual debris  from hallux to fifth toes bilaterally. No evidence of bacterial infection or drainage bilaterally.  Orthopedic  No limitations of motion  feet .  No crepitus or effusions noted.  No bony pathology or digital deformities noted.  Skin  normotropic skin with no porokeratosis noted bilaterally.  No signs of infections or ulcers noted.   Clavi 3rd toe right foot.  Onychomycosis  Pain in right toes  Pain in left toes  Clavi right foot.  Consent was obtained for treatment procedures.   Mechanical debridement of nails 1-5  bilaterally performed with a nail nipper.  Filed with dremel without incident.  Debride clavi with # 15 blade.   Return office visit   3 months                  Told patient to return for periodic foot care and evaluation due to potential at risk complications.   Gregory Mayer DPM  

## 2022-02-21 DIAGNOSIS — E1122 Type 2 diabetes mellitus with diabetic chronic kidney disease: Secondary | ICD-10-CM | POA: Diagnosis not present

## 2022-02-21 DIAGNOSIS — N2581 Secondary hyperparathyroidism of renal origin: Secondary | ICD-10-CM | POA: Diagnosis not present

## 2022-02-21 DIAGNOSIS — I495 Sick sinus syndrome: Secondary | ICD-10-CM | POA: Diagnosis not present

## 2022-02-21 DIAGNOSIS — I129 Hypertensive chronic kidney disease with stage 1 through stage 4 chronic kidney disease, or unspecified chronic kidney disease: Secondary | ICD-10-CM | POA: Diagnosis not present

## 2022-02-21 DIAGNOSIS — N184 Chronic kidney disease, stage 4 (severe): Secondary | ICD-10-CM | POA: Diagnosis not present

## 2022-03-01 ENCOUNTER — Telehealth: Payer: Self-pay | Admitting: Cardiovascular Disease

## 2022-03-01 NOTE — Telephone Encounter (Signed)
Patient is having dental extraction done tomorrow, daughter in law wants to if it won't affect the pace maker.  Patient want to make sure the pace maker will be alright.

## 2022-03-01 NOTE — Telephone Encounter (Signed)
Patient gave permission via phone for me to speak with daughter-in-law Lattie Haw. Lattie Haw stated patient needs dental procedure and is meeting tomorrow with dentist. Explained clearance procedure to Lattie Haw and gave her the fax number to [employer] to give to dentist.

## 2022-03-04 ENCOUNTER — Observation Stay (HOSPITAL_BASED_OUTPATIENT_CLINIC_OR_DEPARTMENT_OTHER)
Admission: EM | Admit: 2022-03-04 | Discharge: 2022-03-05 | Disposition: A | Discharge status: Home Health | Payer: Medicare Other | Attending: Internal Medicine | Admitting: Internal Medicine

## 2022-03-04 ENCOUNTER — Other Ambulatory Visit: Payer: Self-pay

## 2022-03-04 ENCOUNTER — Emergency Department (HOSPITAL_BASED_OUTPATIENT_CLINIC_OR_DEPARTMENT_OTHER): Payer: Medicare Other

## 2022-03-04 ENCOUNTER — Emergency Department (HOSPITAL_BASED_OUTPATIENT_CLINIC_OR_DEPARTMENT_OTHER): Payer: Medicare Other | Admitting: Radiology

## 2022-03-04 DIAGNOSIS — Z8673 Personal history of transient ischemic attack (TIA), and cerebral infarction without residual deficits: Secondary | ICD-10-CM | POA: Insufficient documentation

## 2022-03-04 DIAGNOSIS — G308 Other Alzheimer's disease: Secondary | ICD-10-CM | POA: Insufficient documentation

## 2022-03-04 DIAGNOSIS — Z043 Encounter for examination and observation following other accident: Secondary | ICD-10-CM | POA: Diagnosis not present

## 2022-03-04 DIAGNOSIS — G459 Transient cerebral ischemic attack, unspecified: Secondary | ICD-10-CM | POA: Diagnosis not present

## 2022-03-04 DIAGNOSIS — Z79899 Other long term (current) drug therapy: Secondary | ICD-10-CM | POA: Insufficient documentation

## 2022-03-04 DIAGNOSIS — I5042 Chronic combined systolic (congestive) and diastolic (congestive) heart failure: Secondary | ICD-10-CM | POA: Diagnosis not present

## 2022-03-04 DIAGNOSIS — E039 Hypothyroidism, unspecified: Secondary | ICD-10-CM | POA: Diagnosis not present

## 2022-03-04 DIAGNOSIS — E1122 Type 2 diabetes mellitus with diabetic chronic kidney disease: Secondary | ICD-10-CM

## 2022-03-04 DIAGNOSIS — Z859 Personal history of malignant neoplasm, unspecified: Secondary | ICD-10-CM | POA: Insufficient documentation

## 2022-03-04 DIAGNOSIS — I130 Hypertensive heart and chronic kidney disease with heart failure and stage 1 through stage 4 chronic kidney disease, or unspecified chronic kidney disease: Secondary | ICD-10-CM | POA: Diagnosis not present

## 2022-03-04 DIAGNOSIS — W19XXXA Unspecified fall, initial encounter: Secondary | ICD-10-CM

## 2022-03-04 DIAGNOSIS — Z955 Presence of coronary angioplasty implant and graft: Secondary | ICD-10-CM | POA: Insufficient documentation

## 2022-03-04 DIAGNOSIS — Z7982 Long term (current) use of aspirin: Secondary | ICD-10-CM | POA: Diagnosis not present

## 2022-03-04 DIAGNOSIS — N184 Chronic kidney disease, stage 4 (severe): Secondary | ICD-10-CM | POA: Insufficient documentation

## 2022-03-04 DIAGNOSIS — I2510 Atherosclerotic heart disease of native coronary artery without angina pectoris: Secondary | ICD-10-CM | POA: Insufficient documentation

## 2022-03-04 DIAGNOSIS — S0510XA Contusion of eyeball and orbital tissues, unspecified eye, initial encounter: Secondary | ICD-10-CM | POA: Diagnosis not present

## 2022-03-04 DIAGNOSIS — Z7902 Long term (current) use of antithrombotics/antiplatelets: Secondary | ICD-10-CM | POA: Diagnosis not present

## 2022-03-04 DIAGNOSIS — Z87891 Personal history of nicotine dependence: Secondary | ICD-10-CM | POA: Diagnosis not present

## 2022-03-04 DIAGNOSIS — F02A0 Dementia in other diseases classified elsewhere, mild, without behavioral disturbance, psychotic disturbance, mood disturbance, and anxiety: Secondary | ICD-10-CM | POA: Insufficient documentation

## 2022-03-04 DIAGNOSIS — R278 Other lack of coordination: Secondary | ICD-10-CM | POA: Diagnosis present

## 2022-03-04 DIAGNOSIS — Z950 Presence of cardiac pacemaker: Secondary | ICD-10-CM | POA: Insufficient documentation

## 2022-03-04 DIAGNOSIS — Z794 Long term (current) use of insulin: Secondary | ICD-10-CM | POA: Diagnosis not present

## 2022-03-04 LAB — CBC WITH DIFFERENTIAL/PLATELET
Abs Immature Granulocytes: 0.02 10*3/uL (ref 0.00–0.07)
Basophils Absolute: 0 10*3/uL (ref 0.0–0.1)
Basophils Relative: 0 %
Eosinophils Absolute: 0.1 10*3/uL (ref 0.0–0.5)
Eosinophils Relative: 2 %
HCT: 48.3 % (ref 39.0–52.0)
Hemoglobin: 15.8 g/dL (ref 13.0–17.0)
Immature Granulocytes: 0 %
Lymphocytes Relative: 19 %
Lymphs Abs: 1.4 10*3/uL (ref 0.7–4.0)
MCH: 30 pg (ref 26.0–34.0)
MCHC: 32.7 g/dL (ref 30.0–36.0)
MCV: 91.8 fL (ref 80.0–100.0)
Monocytes Absolute: 0.8 10*3/uL (ref 0.1–1.0)
Monocytes Relative: 10 %
Neutro Abs: 5.4 10*3/uL (ref 1.7–7.7)
Neutrophils Relative %: 69 %
Platelets: 156 10*3/uL (ref 150–400)
RBC: 5.26 MIL/uL (ref 4.22–5.81)
RDW: 14.5 % (ref 11.5–15.5)
WBC: 7.8 10*3/uL (ref 4.0–10.5)
nRBC: 0 % (ref 0.0–0.2)

## 2022-03-04 LAB — COMPREHENSIVE METABOLIC PANEL
ALT: 20 U/L (ref 0–44)
AST: 24 U/L (ref 15–41)
Albumin: 4 g/dL (ref 3.5–5.0)
Alkaline Phosphatase: 55 U/L (ref 38–126)
Anion gap: 11 (ref 5–15)
BUN: 35 mg/dL — ABNORMAL HIGH (ref 8–23)
CO2: 26 mmol/L (ref 22–32)
Calcium: 9.6 mg/dL (ref 8.9–10.3)
Chloride: 101 mmol/L (ref 98–111)
Creatinine, Ser: 2.12 mg/dL — ABNORMAL HIGH (ref 0.61–1.24)
GFR, Estimated: 29 mL/min — ABNORMAL LOW (ref >60)
Glucose, Bld: 242 mg/dL — ABNORMAL HIGH (ref 70–99)
Potassium: 4.6 mmol/L (ref 3.5–5.1)
Sodium: 138 mmol/L (ref 135–145)
Total Bilirubin: 0.6 mg/dL (ref 0.3–1.2)
Total Protein: 7.5 g/dL (ref 6.5–8.1)

## 2022-03-04 LAB — URINALYSIS, ROUTINE W REFLEX MICROSCOPIC
Bilirubin Urine: NEGATIVE
Glucose, UA: 500 mg/dL — AB
Hgb urine dipstick: NEGATIVE
Ketones, ur: NEGATIVE mg/dL
Leukocytes,Ua: NEGATIVE
Nitrite: NEGATIVE
Specific Gravity, Urine: 1.015 (ref 1.005–1.030)
pH: 6 (ref 5.0–8.0)

## 2022-03-04 LAB — TROPONIN I (HIGH SENSITIVITY)
Troponin I (High Sensitivity): 16 ng/L (ref <18)
Troponin I (High Sensitivity): 16 ng/L (ref <18)

## 2022-03-04 LAB — CBG MONITORING, ED: Glucose-Capillary: 167 mg/dL — ABNORMAL HIGH (ref 70–99)

## 2022-03-04 MED ORDER — ASPIRIN 81 MG PO CHEW
81.0000 mg | CHEWABLE_TABLET | Freq: Every day | ORAL | Status: DC
  Administered 2022-03-04: 81 mg via ORAL
  Filled 2022-03-04 (×2): qty 1

## 2022-03-04 MED ORDER — ROSUVASTATIN CALCIUM 5 MG PO TABS
10.0000 mg | ORAL_TABLET | Freq: Every day | ORAL | Status: DC
  Filled 2022-03-04: qty 2
  Filled 2022-03-04: qty 1

## 2022-03-04 MED ORDER — CLOPIDOGREL BISULFATE 75 MG PO TABS
75.0000 mg | ORAL_TABLET | Freq: Once | ORAL | Status: AC
  Administered 2022-03-04: 75 mg via ORAL
  Filled 2022-03-04: qty 1

## 2022-03-04 NOTE — ED Notes (Signed)
PA, Ovid Curd to triage to assess patient.

## 2022-03-04 NOTE — ED Triage Notes (Signed)
Patient arrives via POV with complaints of a fall that happened prior to arrival. Patient fell of his couch onto the floor, landing on his face. Takes plavix due to a previous stroke. Patient rates pain an 8/10.

## 2022-03-04 NOTE — ED Notes (Signed)
Pt awake and alert- GCS 15.  Speech clear.  RR even and unlabored on RA with symmetrical rise and fall of chest.  O2 sats 92% on RA; pt denies chronic respiratory issues.  Continuous cardiac and pulse ox maintained.  Pt reports R upper facial pain related to injury -- R periorbital hematoma with ecchymosis - no active bleeding; R eye sclera redness noted- pt denies vision changes.  Denies dizziness/HA.  Denies cp/sob.  Abdomen round/obese/soft/nontender.  Pt personal rolling walker at the bedside. Urinal at bedside for toileting.  Pt provided 8 oz water at this time. Pt and son, at bedside, updated on plan for admission - pt/son agreeable with admission.  This nurse will continue to monitor for acute changes and maintain plan of care as pt awaits bed assignment.

## 2022-03-04 NOTE — Plan of Care (Addendum)
[name], is a 88 y.o. 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  Call Neuro upon arrival - DAPT for now, ASA added - Plavix if facial hematoma stable and if he has not takes his AM dose today ( requested PA Sherrell Puller to confirm and order).   Vitals:   03/04/22 1500 03/04/22 1530 03/04/22 1600 03/04/22 1630  BP: (!) 162/85 (!) 164/80 (!) 136/95 (!) 143/93  Pulse: 70 73 81 79  Resp:      Temp:      SpO2: 95% 94% 94% 94%  Weight:      Height:        Data Review   Micro Results No results found for this or any previous visit (from the past 240 hour(s)).  Radiology Reports CT Head Wo Contrast  Result Date: 03/04/2022 CLINICAL DATA:  Fall EXAM: CT HEAD WITHOUT CONTRAST CT MAXILLOFACIAL WITHOUT CONTRAST CT CERVICAL SPINE WITHOUT CONTRAST TECHNIQUE: Multidetector CT imaging of the head, cervical spine, and maxillofacial structures were performed using the standard protocol without intravenous contrast. Multiplanar CT image reconstructions of the cervical spine and maxillofacial structures were also generated. RADIATION DOSE REDUCTION: This exam was performed according to the departmental dose-optimization program which includes automated exposure control, adjustment of the mA and/or kV according to patient size and/or use of iterative reconstruction technique. COMPARISON:  05/10/2021 CT head; no prior CT cervical  spine or maxillofacial FINDINGS: CT HEAD FINDINGS Brain: No evidence of acute infarct, hemorrhage, mass, mass effect, or midline shift. No hydrocephalus or extra-axial fluid collection. Periventricular white matter changes, likely the sequela of chronic small vessel ischemic disease. Age related cerebral atrophy. Basal ganglia calcifications. Vascular: No hyperdense vessel. Skull: Normal. Negative for fracture or focal lesion. CT MAXILLOFACIAL FINDINGS Osseous: No fracture or mandibular dislocation. No destructive process. Orbits: Negative. No traumatic or inflammatory finding. Status post bilateral lens replacements. Sinuses: Mild mucosal thickening in the right maxillary sinus, with osseous thickening, likely chronic right maxillary sinusitis. Paranasal sinuses otherwise clear. The mastoids are well aerated. Soft tissues: Right periorbital and premalar hematoma, which measures up to 2.2 x 1.9 x 3.2 cm (AP x TR x CC). CT CERVICAL SPINE FINDINGS Alignment: Straightening and mild reversal of the normal cervical lordosis. No listhesis. Skull base and vertebrae: No acute fracture. No suspicious osseous lesion. Diffuse osseous bridging throughout the cervical spine. Soft tissues and spinal canal: No prevertebral fluid or swelling. No visible canal hematoma. Disc levels:  No high-grade spinal canal stenosis. Upper chest: Negative. Other: Small hypoenhancing nodules in the thyroid (< 1.5 cm), for which no follow-up is indicated. (Reference: J Am Coll Radiol. 2015 Feb;12(2): 143-50) IMPRESSION: 1. No acute intracranial process. 2. Right periorbital and premalar hematoma. No acute facial bone fracture. 3. No acute fracture or traumatic listhesis in the cervical spine. Diffuse osseous bridging is noted throughout the cervical spine, possibly diffuse idiopathic skeletal hyperostosis. Electronically Signed   By: Merilyn Baba M.D.   On: 03/04/2022 13:42   CT Cervical Spine Wo Contrast  Result Date: 03/04/2022 CLINICAL  DATA:  Fall EXAM: CT HEAD WITHOUT CONTRAST CT MAXILLOFACIAL WITHOUT CONTRAST CT CERVICAL SPINE WITHOUT CONTRAST TECHNIQUE: Multidetector CT imaging of the head, cervical spine, and maxillofacial structures were performed using the standard protocol without intravenous contrast. Multiplanar CT image reconstructions of the cervical spine and maxillofacial structures were also generated. RADIATION DOSE REDUCTION: This exam was performed according to the departmental dose-optimization program which includes automated exposure control, adjustment of the mA and/or kV according to patient size and/or use of iterative reconstruction technique. COMPARISON:  05/10/2021 CT head; no prior CT cervical spine or maxillofacial FINDINGS: CT HEAD FINDINGS Brain: No evidence of acute infarct, hemorrhage, mass, mass effect, or midline shift. No hydrocephalus or extra-axial fluid collection. Periventricular white matter changes, likely the sequela of chronic small vessel ischemic disease. Age related cerebral atrophy. Basal ganglia calcifications. Vascular: No hyperdense vessel. Skull: Normal. Negative for fracture or focal lesion. CT MAXILLOFACIAL FINDINGS Osseous: No fracture or mandibular dislocation. No destructive process. Orbits: Negative. No traumatic or inflammatory finding. Status post bilateral lens replacements. Sinuses: Mild mucosal thickening in the right maxillary sinus, with osseous thickening, likely chronic right maxillary sinusitis. Paranasal sinuses otherwise clear. The mastoids are well aerated. Soft tissues: Right periorbital and premalar hematoma, which measures up to 2.2 x 1.9 x 3.2 cm (AP x TR x CC). CT CERVICAL SPINE FINDINGS Alignment: Straightening and mild reversal of the normal cervical lordosis. No listhesis. Skull base and vertebrae: No acute fracture. No suspicious osseous lesion. Diffuse osseous bridging throughout the cervical spine. Soft tissues and spinal canal: No prevertebral fluid or swelling. No  visible canal hematoma. Disc levels:  No high-grade spinal canal stenosis. Upper chest: Negative. Other: Small hypoenhancing nodules in the thyroid (< 1.5 cm), for which no follow-up is indicated. (Reference: J Am Coll Radiol. 2015 Feb;12(2): 143-50) IMPRESSION: 1. No acute intracranial process. 2. Right periorbital and premalar hematoma. No acute facial bone fracture. 3. No acute fracture or traumatic listhesis in the cervical spine. Diffuse osseous bridging is noted throughout the cervical spine, possibly diffuse idiopathic skeletal hyperostosis. Electronically Signed   By: Merilyn Baba M.D.   On: 03/04/2022 13:42   CT Maxillofacial Wo Contrast  Result Date: 03/04/2022 CLINICAL DATA:  Fall EXAM: CT HEAD WITHOUT CONTRAST CT MAXILLOFACIAL WITHOUT CONTRAST CT CERVICAL SPINE WITHOUT CONTRAST TECHNIQUE: Multidetector CT imaging of the head, cervical spine, and maxillofacial structures were performed using the standard protocol without intravenous contrast. Multiplanar CT image reconstructions of the cervical spine and maxillofacial structures were also generated. RADIATION DOSE REDUCTION: This exam was performed according to the departmental dose-optimization program which includes automated exposure control, adjustment of the mA and/or kV according to patient size and/or use of iterative reconstruction technique. COMPARISON:  05/10/2021 CT head; no prior CT cervical spine or maxillofacial FINDINGS: CT HEAD FINDINGS Brain: No evidence of acute infarct, hemorrhage, mass, mass effect, or midline shift. No hydrocephalus or extra-axial fluid collection. Periventricular white matter changes, likely the sequela of chronic small vessel ischemic disease. Age related cerebral atrophy. Basal ganglia calcifications. Vascular: No hyperdense vessel. Skull: Normal. Negative for fracture or focal lesion. CT MAXILLOFACIAL FINDINGS Osseous: No fracture or mandibular dislocation. No destructive process. Orbits: Negative. No  traumatic or inflammatory finding. Status post bilateral lens replacements. Sinuses: Mild mucosal thickening in the right maxillary sinus, with osseous thickening, likely chronic right maxillary sinusitis. Paranasal sinuses otherwise clear. The mastoids are well aerated. Soft tissues: Right periorbital and premalar hematoma, which measures up to 2.2 x 1.9 x 3.2 cm (AP x TR  x CC). CT CERVICAL SPINE FINDINGS Alignment: Straightening and mild reversal of the normal cervical lordosis. No listhesis. Skull base and vertebrae: No acute fracture. No suspicious osseous lesion. Diffuse osseous bridging throughout the cervical spine. Soft tissues and spinal canal: No prevertebral fluid or swelling. No visible canal hematoma. Disc levels:  No high-grade spinal canal stenosis. Upper chest: Negative. Other: Small hypoenhancing nodules in the thyroid (< 1.5 cm), for which no follow-up is indicated. (Reference: J Am Coll Radiol. 2015 Feb;12(2): 143-50) IMPRESSION: 1. No acute intracranial process. 2. Right periorbital and premalar hematoma. No acute facial bone fracture. 3. No acute fracture or traumatic listhesis in the cervical spine. Diffuse osseous bridging is noted throughout the cervical spine, possibly diffuse idiopathic skeletal hyperostosis. Electronically Signed   By: Merilyn Baba M.D.   On: 03/04/2022 13:42   DG Chest 2 View  Result Date: 03/04/2022 CLINICAL DATA:  Possible syncope.  Fall. EXAM: CHEST - 2 VIEW COMPARISON:  10/01/2021 FINDINGS: 1319 hours. Low volume film. The cardio pericardial silhouette is enlarged. The lungs are clear without focal pneumonia, edema, pneumothorax or pleural effusion. Right permanent pacemaker noted. Telemetry leads overlie the chest. IMPRESSION: Low volume film without acute cardiopulmonary findings. Electronically Signed   By: Misty Stanley M.D.   On: 03/04/2022 13:34    CBC Recent Labs  Lab 03/04/22 1302  WBC 7.8  HGB 15.8  HCT 48.3  PLT 156  MCV 91.8  MCH 30.0   MCHC 32.7  RDW 14.5  LYMPHSABS 1.4  MONOABS 0.8  EOSABS 0.1  BASOSABS 0.0    Chemistries  Recent Labs  Lab 03/04/22 1302  NA 138  K 4.6  CL 101  CO2 26  GLUCOSE 242*  BUN 35*  CREATININE 2.12*  CALCIUM 9.6  AST 24  ALT 20  ALKPHOS 55  BILITOT 0.6   Signature  Lala Lund M.D on 03/04/2022 at 5:06 PM   -  To page go to [email or web address]

## 2022-03-04 NOTE — ED Notes (Signed)
Note: we do not have the necessary equipment to interrogate a [employer] pacer. P.A. Ovid Curd is made aware of this.

## 2022-03-04 NOTE — ED Provider Notes (Signed)
[facility] EMERGENCY DEPT Provider Note   CSN: [id number] Arrival date & time: 03/04/22  1140     History Chief Complaint  Patient presents with   Fall   Facial Injury    [name] is a 88 y.o. male with h/o CAD, hyperlipidemia, type 2 diabetes with CKD stage III, pacemaker, mixed Alzheimer's and vascular dementia presents the emergency room and for evaluation of fall.  Earlier today, patient reports that he was making coffee, he had an episode of he had a hard time screwing the lid back on the coffee.  He had momentary sense of confusion and had trouble moving his feet and fell landing his right side of the face on the floor.  Denies any LOC.  Patient is currently on Plavix and aspirin.  He denies any abdominal pain, chest pain, shortness of breath, extremity pain.  Denies any back pain or neck pain. He denies any pain on movement of his eyes.  Denies any blurry vision.  Denies any recent illnesses. He reports that this felt just like his last stroke.  He is back to his baseline however.  He did not take his morning medications this morning.   Fall Associated symptoms include headaches. Pertinent negatives include no chest pain, no abdominal pain and no shortness of breath.  Facial Injury Associated symptoms: headaches   Associated symptoms: no nausea, no neck pain and no vomiting        Home Medications Prior to Admission medications   Medication Sig Start Date End Date Taking? Authorizing Provider  acetaminophen (TYLENOL) 500 MG tablet Take 500 mg by mouth 2 (two) times daily.    [provider]  aspirin 81 MG tablet Take 81 mg by mouth every evening.     [provider]  BD VEO INSULIN SYRINGE U/F 31G X 15/64" 1 ML MISC USE TO [place] DAILY 04/01/19   [provider]  bisacodyl (DULCOLAX) 5 MG EC tablet 3 tablets as needed    [provider]  calcitRIOL (ROCALTROL) 0.25 MCG capsule Take 0.25 mcg by  mouth every Monday, Wednesday, and Friday. evening    [provider]  clopidogrel (PLAVIX) 75 MG tablet Take 1 tablet by mouth once daily 01/03/22   Croitoru, Mihai, MD  Coenzyme Q10 (CO Q 10) 100 MG CAPS Take 100 mg by mouth daily in the afternoon.    [provider]  Continuous Blood Gluc Sensor (FREESTYLE LIBRE 3 SENSOR) MISC ON INSULIN TWICE A DAY. CHANGE SENSOR EVERY 14 DAYS 09/23/21   [provider]  Continuous Blood Gluc Sensor (FREESTYLE LIBRE 3 SENSOR) MISC  06/16/21   [provider]  Emollient (UDDERLY SMOOTH EX) Apply 1 application. topically daily. Apply to both feet    [provider]  famotidine (PEPCID) 20 MG tablet Take 1 tablet (20 mg total) by mouth daily. 05/02/21   Mercy Riding, MD  famotidine (PEPCID) 40 MG tablet Take 40 mg by mouth at bedtime.    [provider]  furosemide (LASIX) 20 MG tablet Take 1 tablet (20 mg total) by mouth daily as needed for edema. 10/03/21   Croitoru, Mihai, MD  insulin NPH-regular Human (70-30) 100 UNIT/ML injection Inject 50 Units into the skin 2 (two) times daily with a meal. 05/02/21   Gonfa, Charlesetta Ivory, MD  irbesartan (AVAPRO) 150 MG tablet Take 1 tablet (150 mg total) by mouth daily. 05/06/21   Mercy Riding, MD  levothyroxine (SYNTHROID, LEVOTHROID) 25  MCG tablet Take 25 mcg by mouth daily.  02/09/14   [provider]  memantine (NAMENDA) 10 MG tablet Take 1 tablet (10 mg total) by mouth 2 (two) times daily. 07/21/21   Rondel Jumbo, PA-C  metoprolol succinate (TOPROL-XL) 25 MG 24 hr tablet Take 1 tablet (25 mg total) by mouth daily. 10/04/21   Croitoru, Mihai, MD  Multiple Vitamin (RENAL MULTIVITAMIN/ZINC PO)     [provider]  nitroGLYCERIN (NITROSTAT) 0.4 MG SL tablet Place 1 tablet (0.4 mg total) under the tongue every 5 (five) minutes as needed for chest pain. 12/27/18   Croitoru, Dani Gobble, MD  [facility] ULTRA test strip  05/10/19   [provider]  OVER THE COUNTER  MEDICATION Take 1 capsule by mouth every evening. OTC vitamin for patients with kidney issues    [provider]  polyethylene glycol (MIRALAX / GLYCOLAX) 17 g packet Take 17 g by mouth daily. 08/25/21   Charlesetta Shanks, MD  rosuvastatin (CRESTOR) 40 MG tablet Take 1 tablet by mouth once daily 01/03/22   Croitoru, Dani Gobble, MD      Allergies    Patient has no known allergies.    Review of Systems   Review of Systems  Constitutional:  Negative for chills and fever.  Respiratory:  Negative for shortness of breath.   Cardiovascular:  Negative for chest pain.  Gastrointestinal:  Negative for abdominal pain, nausea and vomiting.  Genitourinary:  Negative for dysuria.  Musculoskeletal:  Negative for back pain and neck pain.  Neurological:  Positive for headaches.    Physical Exam Updated Vital Signs BP (!) 161/108   Pulse 82   Temp 97.8 F (36.6 C)   Resp 20   Ht '5\' 8"'$  (1.727 m)   Wt 105 kg   SpO2 97%   BMI 35.20 kg/m  Physical Exam Vitals and nursing note reviewed.  Constitutional:      General: He is not in acute distress.    Appearance: Normal appearance. He is not ill-appearing or toxic-appearing.  HENT:     Head:     Comments: Swelling noted to the right orbit, mainly to the lower. No Battle signs or raccoon eyes. No step offs or deformities. Scalp nontender to palpation.     Nose: Nose normal.     Mouth/Throat:     Mouth: Mucous membranes are moist.  Eyes:     General: No scleral icterus.    Extraocular Movements: Extraocular movements intact.     Pupils: Pupils are equal, round, and reactive to light.     Comments: EOMI without pain.   Neck:     Comments: No step offs of deformities. Nontender to palpation.  Cardiovascular:     Rate and Rhythm: Normal rate and regular rhythm.  Pulmonary:     Effort: Pulmonary effort is normal. No respiratory distress.  Abdominal:     General: Bowel sounds are normal.     Palpations: Abdomen is soft.     Tenderness: There is  no abdominal tenderness. There is no guarding or rebound.  Musculoskeletal:        General: No deformity.     Cervical back: Normal range of motion. No tenderness.     Comments: Patient has chronic tenderness over his right shoulder although reports is at baseline.  Palpable pulses.  Compartments are soft.  He is moving all 4 extremities.  No midline or paraspinal cervical, thoracic, or lumbar tenderness to palpation.  Skin:    General: Skin  is warm and dry.  Neurological:     General: No focal deficit present.     Mental Status: He is alert. Mental status is at baseline.     GCS: GCS eye subscore is 4. GCS verbal subscore is 5. GCS motor subscore is 6.     Cranial Nerves: No cranial nerve deficit.     Sensory: No sensory deficit.     Coordination: Finger-Nose-Finger Test normal.     Comments: Patient is answering questions appropriately with appropriate speech.  No facial droop noted.  Cranial nerves II through XII intact.  Sensation intact throughout.  Grip strength intact and equal bilaterally.  Strength in his right upper extremity limited secondary to pain with this chronic right shoulder pain.  Pronator drift also unable to be assessed due to the patient's chronic right shoulder pain.  Normal finger-nose.     ED Results / Procedures / Treatments   Labs (all labs ordered are listed, but only abnormal results are displayed) Labs Reviewed  COMPREHENSIVE METABOLIC PANEL - Abnormal; Notable for the following components:      Result Value   Glucose, Bld 242 (*)    BUN 35 (*)    Creatinine, Ser 2.12 (*)    GFR, Estimated 29 (*)    All other components within normal limits  URINALYSIS, ROUTINE W REFLEX MICROSCOPIC - Abnormal; Notable for the following components:   APPearance HAZY (*)    Glucose, UA 500 (*)    Protein, ur TRACE (*)    All other components within normal limits  CBC WITH DIFFERENTIAL/PLATELET  TROPONIN I (HIGH SENSITIVITY)  TROPONIN I (HIGH SENSITIVITY)    EKG EKG  Interpretation  Date/Time:  Saturday March 04 2022 12:56:18 EDT Ventricular Rate:  78 PR Interval:  190 QRS Duration: 192 QT Interval:  454 QTC Calculation: 518 R Axis:   -67 Text Interpretation: Age not entered, assumed to be  88 years old for purpose of ECG interpretation Atrial-sensed ventricular-paced rhythm No further analysis attempted due to paced rhythm No significant change since last tracing Confirmed by Fredia Sorrow [phone]) on 03/04/2022 5:14:12 PM  Radiology CT Head Wo Contrast  Result Date: 03/04/2022 CLINICAL DATA:  Fall EXAM: CT HEAD WITHOUT CONTRAST CT MAXILLOFACIAL WITHOUT CONTRAST CT CERVICAL SPINE WITHOUT CONTRAST TECHNIQUE: Multidetector CT imaging of the head, cervical spine, and maxillofacial structures were performed using the standard protocol without intravenous contrast. Multiplanar CT image reconstructions of the cervical spine and maxillofacial structures were also generated. RADIATION DOSE REDUCTION: This exam was performed according to the departmental dose-optimization program which includes automated exposure control, adjustment of the mA and/or kV according to patient size and/or use of iterative reconstruction technique. COMPARISON:  05/10/2021 CT head; no prior CT cervical spine or maxillofacial FINDINGS: CT HEAD FINDINGS Brain: No evidence of acute infarct, hemorrhage, mass, mass effect, or midline shift. No hydrocephalus or extra-axial fluid collection. Periventricular white matter changes, likely the sequela of chronic small vessel ischemic disease. Age related cerebral atrophy. Basal ganglia calcifications. Vascular: No hyperdense vessel. Skull: Normal. Negative for fracture or focal lesion. CT MAXILLOFACIAL FINDINGS Osseous: No fracture or mandibular dislocation. No destructive process. Orbits: Negative. No traumatic or inflammatory finding. Status post bilateral lens replacements. Sinuses: Mild mucosal thickening in the right maxillary sinus, with osseous  thickening, likely chronic right maxillary sinusitis. Paranasal sinuses otherwise clear. The mastoids are well aerated. Soft tissues: Right periorbital and premalar hematoma, which measures up to 2.2 x 1.9 x 3.2 cm (AP x TR x CC).  CT CERVICAL SPINE FINDINGS Alignment: Straightening and mild reversal of the normal cervical lordosis. No listhesis. Skull base and vertebrae: No acute fracture. No suspicious osseous lesion. Diffuse osseous bridging throughout the cervical spine. Soft tissues and spinal canal: No prevertebral fluid or swelling. No visible canal hematoma. Disc levels:  No high-grade spinal canal stenosis. Upper chest: Negative. Other: Small hypoenhancing nodules in the thyroid (< 1.5 cm), for which no follow-up is indicated. (Reference: J Am Coll Radiol. 2015 Feb;12(2): 143-50) IMPRESSION: 1. No acute intracranial process. 2. Right periorbital and premalar hematoma. No acute facial bone fracture. 3. No acute fracture or traumatic listhesis in the cervical spine. Diffuse osseous bridging is noted throughout the cervical spine, possibly diffuse idiopathic skeletal hyperostosis. Electronically Signed   By: Merilyn Baba M.D.   On: 03/04/2022 13:42   CT Cervical Spine Wo Contrast  Result Date: 03/04/2022 CLINICAL DATA:  Fall EXAM: CT HEAD WITHOUT CONTRAST CT MAXILLOFACIAL WITHOUT CONTRAST CT CERVICAL SPINE WITHOUT CONTRAST TECHNIQUE: Multidetector CT imaging of the head, cervical spine, and maxillofacial structures were performed using the standard protocol without intravenous contrast. Multiplanar CT image reconstructions of the cervical spine and maxillofacial structures were also generated. RADIATION DOSE REDUCTION: This exam was performed according to the departmental dose-optimization program which includes automated exposure control, adjustment of the mA and/or kV according to patient size and/or use of iterative reconstruction technique. COMPARISON:  05/10/2021 CT head; no prior CT cervical spine  or maxillofacial FINDINGS: CT HEAD FINDINGS Brain: No evidence of acute infarct, hemorrhage, mass, mass effect, or midline shift. No hydrocephalus or extra-axial fluid collection. Periventricular white matter changes, likely the sequela of chronic small vessel ischemic disease. Age related cerebral atrophy. Basal ganglia calcifications. Vascular: No hyperdense vessel. Skull: Normal. Negative for fracture or focal lesion. CT MAXILLOFACIAL FINDINGS Osseous: No fracture or mandibular dislocation. No destructive process. Orbits: Negative. No traumatic or inflammatory finding. Status post bilateral lens replacements. Sinuses: Mild mucosal thickening in the right maxillary sinus, with osseous thickening, likely chronic right maxillary sinusitis. Paranasal sinuses otherwise clear. The mastoids are well aerated. Soft tissues: Right periorbital and premalar hematoma, which measures up to 2.2 x 1.9 x 3.2 cm (AP x TR x CC). CT CERVICAL SPINE FINDINGS Alignment: Straightening and mild reversal of the normal cervical lordosis. No listhesis. Skull base and vertebrae: No acute fracture. No suspicious osseous lesion. Diffuse osseous bridging throughout the cervical spine. Soft tissues and spinal canal: No prevertebral fluid or swelling. No visible canal hematoma. Disc levels:  No high-grade spinal canal stenosis. Upper chest: Negative. Other: Small hypoenhancing nodules in the thyroid (< 1.5 cm), for which no follow-up is indicated. (Reference: J Am Coll Radiol. 2015 Feb;12(2): 143-50) IMPRESSION: 1. No acute intracranial process. 2. Right periorbital and premalar hematoma. No acute facial bone fracture. 3. No acute fracture or traumatic listhesis in the cervical spine. Diffuse osseous bridging is noted throughout the cervical spine, possibly diffuse idiopathic skeletal hyperostosis. Electronically Signed   By: Merilyn Baba M.D.   On: 03/04/2022 13:42   CT Maxillofacial Wo Contrast  Result Date: 03/04/2022 CLINICAL DATA:   Fall EXAM: CT HEAD WITHOUT CONTRAST CT MAXILLOFACIAL WITHOUT CONTRAST CT CERVICAL SPINE WITHOUT CONTRAST TECHNIQUE: Multidetector CT imaging of the head, cervical spine, and maxillofacial structures were performed using the standard protocol without intravenous contrast. Multiplanar CT image reconstructions of the cervical spine and maxillofacial structures were also generated. RADIATION DOSE REDUCTION: This exam was performed according to the departmental dose-optimization program which includes automated exposure control, adjustment of  the mA and/or kV according to patient size and/or use of iterative reconstruction technique. COMPARISON:  05/10/2021 CT head; no prior CT cervical spine or maxillofacial FINDINGS: CT HEAD FINDINGS Brain: No evidence of acute infarct, hemorrhage, mass, mass effect, or midline shift. No hydrocephalus or extra-axial fluid collection. Periventricular white matter changes, likely the sequela of chronic small vessel ischemic disease. Age related cerebral atrophy. Basal ganglia calcifications. Vascular: No hyperdense vessel. Skull: Normal. Negative for fracture or focal lesion. CT MAXILLOFACIAL FINDINGS Osseous: No fracture or mandibular dislocation. No destructive process. Orbits: Negative. No traumatic or inflammatory finding. Status post bilateral lens replacements. Sinuses: Mild mucosal thickening in the right maxillary sinus, with osseous thickening, likely chronic right maxillary sinusitis. Paranasal sinuses otherwise clear. The mastoids are well aerated. Soft tissues: Right periorbital and premalar hematoma, which measures up to 2.2 x 1.9 x 3.2 cm (AP x TR x CC). CT CERVICAL SPINE FINDINGS Alignment: Straightening and mild reversal of the normal cervical lordosis. No listhesis. Skull base and vertebrae: No acute fracture. No suspicious osseous lesion. Diffuse osseous bridging throughout the cervical spine. Soft tissues and spinal canal: No prevertebral fluid or swelling. No visible  canal hematoma. Disc levels:  No high-grade spinal canal stenosis. Upper chest: Negative. Other: Small hypoenhancing nodules in the thyroid (< 1.5 cm), for which no follow-up is indicated. (Reference: J Am Coll Radiol. 2015 Feb;12(2): 143-50) IMPRESSION: 1. No acute intracranial process. 2. Right periorbital and premalar hematoma. No acute facial bone fracture. 3. No acute fracture or traumatic listhesis in the cervical spine. Diffuse osseous bridging is noted throughout the cervical spine, possibly diffuse idiopathic skeletal hyperostosis. Electronically Signed   By: Merilyn Baba M.D.   On: 03/04/2022 13:42   DG Chest 2 View  Result Date: 03/04/2022 CLINICAL DATA:  Possible syncope.  Fall. EXAM: CHEST - 2 VIEW COMPARISON:  10/01/2021 FINDINGS: 1319 hours. Low volume film. The cardio pericardial silhouette is enlarged. The lungs are clear without focal pneumonia, edema, pneumothorax or pleural effusion. Right permanent pacemaker noted. Telemetry leads overlie the chest. IMPRESSION: Low volume film without acute cardiopulmonary findings. Electronically Signed   By: Misty Stanley M.D.   On: 03/04/2022 13:34    Procedures Procedures   Medications Ordered in ED Medications  aspirin chewable tablet 81 mg (81 mg Oral Given 03/04/22 2014)  rosuvastatin (CRESTOR) tablet 10 mg (10 mg Oral Not Given 03/04/22 2029)  clopidogrel (PLAVIX) tablet 75 mg (75 mg Oral Given 03/04/22 2144)    ED Course/ Medical Decision Making/ A&P                           Medical Decision Making Amount and/or Complexity of Data Reviewed Labs: ordered. Radiology: ordered.  Risk Prescription drug management. Decision regarding hospitalization.   88 year old male presents the emergency department for evaluation after fall today.  Differential diagnosis includes was limited to syncope, mechanical fall, stroke, TIA, electrolyte abnormality, UTI, brain bleed, orbital fracture.  Vital signs show slightly elevated blood  pressure otherwise unremarkable.  Physical exam as noted above.  Discussed this with my attending.  No code stroke needed given the patient's back to his baseline.  We will proceed with CT imaging and labs.  I independently reviewed and interpreted the patient's labs.  CMP shows a blood glucose of 242.  Patient's creatinine is at 2.12, chronically elevated appears to be around baseline if not slightly improved.  No other electrolyte or LFT abnormality.  CBC without leukocytosis or  anemia.  Urinalysis shows hazy urine with 500 glucose and trace protein, however no nitrates leukocytes and no white blood cells, less assistant with a UTI.  CT imaging shows 1. No acute intracranial process. 2. Right periorbital and premalar hematoma. No acute facial bone fracture. 3. No acute fracture or traumatic listhesis in the cervical spine. Diffuse osseous bridging is noted throughout the cervical spine, possibly diffuse idiopathic skeletal hyperostosis.   EKG reviewed and interpreted by my attending and shows Atrial-sensed ventricular-paced rhythm.  Encouraged the patient to placed ice on the eye injury. On re-evaluation, he reports that he still feel ok, but his face is hurting.   Spoke with Dr. Quinn Axe who recommends aspirin and Plavix if he has not already had his today.  She remains and if he is not a candidate for an MRI due to his pacemaker that they should repeat CT scan in 24 hours.  Otherwise, normal stroke work-up.  Patient is amenable to admission.  Discussed with hospitalist Dr. Candiss Norse who will admit the patient to [facility].   I discussed this case with my attending physician who cosigned this note including patient's presenting symptoms, physical exam, and planned diagnostics and interventions. Attending physician stated agreement with plan or made changes to plan which were implemented.   Attending physician assessed patient at bedside.  Final Clinical Impression(s) / ED Diagnoses Final  diagnoses:  Fall, initial encounter  TIA (transient ischemic attack)    Rx / DC Orders ED Discharge Orders     None         Sherrell Puller, PA-C 03/04/22 2339    Cristie Hem, MD 03/05/22 [phone]

## 2022-03-05 ENCOUNTER — Observation Stay (HOSPITAL_BASED_OUTPATIENT_CLINIC_OR_DEPARTMENT_OTHER): Payer: Medicare Other

## 2022-03-05 DIAGNOSIS — G459 Transient cerebral ischemic attack, unspecified: Secondary | ICD-10-CM

## 2022-03-05 DIAGNOSIS — E1122 Type 2 diabetes mellitus with diabetic chronic kidney disease: Secondary | ICD-10-CM | POA: Diagnosis not present

## 2022-03-05 DIAGNOSIS — N1832 Chronic kidney disease, stage 3b: Secondary | ICD-10-CM | POA: Diagnosis not present

## 2022-03-05 DIAGNOSIS — Z794 Long term (current) use of insulin: Secondary | ICD-10-CM

## 2022-03-05 LAB — GLUCOSE, CAPILLARY
Glucose-Capillary: 288 mg/dL — ABNORMAL HIGH (ref 70–99)
Glucose-Capillary: 248 mg/dL — ABNORMAL HIGH (ref 70–99)
Glucose-Capillary: 283 mg/dL — ABNORMAL HIGH (ref 70–99)

## 2022-03-05 LAB — BASIC METABOLIC PANEL
Anion gap: 10 (ref 5–15)
BUN: 31 mg/dL — ABNORMAL HIGH (ref 8–23)
CO2: 23 mmol/L (ref 22–32)
Calcium: 9.1 mg/dL (ref 8.9–10.3)
Chloride: 102 mmol/L (ref 98–111)
Creatinine, Ser: 2.06 mg/dL — ABNORMAL HIGH (ref 0.61–1.24)
GFR, Estimated: 30 mL/min — ABNORMAL LOW (ref >60)
Glucose, Bld: 257 mg/dL — ABNORMAL HIGH (ref 70–99)
Potassium: 4.4 mmol/L (ref 3.5–5.1)
Sodium: 135 mmol/L (ref 135–145)

## 2022-03-05 LAB — LIPID PANEL
Cholesterol: 147 mg/dL (ref 0–200)
HDL: 35 mg/dL — ABNORMAL LOW (ref >40)
LDL Cholesterol: 47 mg/dL (ref 0–99)
Total CHOL/HDL Ratio: 4.2 RATIO
Triglycerides: 327 mg/dL — ABNORMAL HIGH (ref <150)
VLDL: 65 mg/dL — ABNORMAL HIGH (ref 0–40)

## 2022-03-05 LAB — CBC
HCT: 45.9 % (ref 39.0–52.0)
Hemoglobin: 15.3 g/dL (ref 13.0–17.0)
MCH: 30.2 pg (ref 26.0–34.0)
MCHC: 33.3 g/dL (ref 30.0–36.0)
MCV: 90.5 fL (ref 80.0–100.0)
Platelets: 147 10*3/uL — ABNORMAL LOW (ref 150–400)
RBC: 5.07 MIL/uL (ref 4.22–5.81)
RDW: 14.4 % (ref 11.5–15.5)
WBC: 8.3 10*3/uL (ref 4.0–10.5)
nRBC: 0 % (ref 0.0–0.2)

## 2022-03-05 LAB — CORTISOL: Cortisol, Plasma: 19.6 ug/dL

## 2022-03-05 LAB — MAGNESIUM: Magnesium: 2.2 mg/dL (ref 1.7–2.4)

## 2022-03-05 LAB — HEMOGLOBIN A1C
Hgb A1c MFr Bld: 9.1 % — ABNORMAL HIGH (ref 4.8–5.6)
Mean Plasma Glucose: 214.47 mg/dL

## 2022-03-05 LAB — PHOSPHORUS: Phosphorus: 3.3 mg/dL (ref 2.5–4.6)

## 2022-03-05 LAB — TSH: TSH: 2.469 u[IU]/mL (ref 0.350–4.500)

## 2022-03-05 MED ORDER — INSULIN ASPART 100 UNIT/ML IJ SOLN
0.0000 [IU] | Freq: Three times a day (TID) | INTRAMUSCULAR | Status: DC
  Administered 2022-03-05: 5 [IU] via SUBCUTANEOUS
  Administered 2022-03-05: 8 [IU] via SUBCUTANEOUS

## 2022-03-05 MED ORDER — INSULIN GLARGINE-YFGN 100 UNIT/ML ~~LOC~~ SOLN
10.0000 [IU] | Freq: Every day | SUBCUTANEOUS | Status: DC
  Administered 2022-03-05: 10 [IU] via SUBCUTANEOUS
  Filled 2022-03-05: qty 0.1

## 2022-03-05 MED ORDER — INSULIN ASPART 100 UNIT/ML IJ SOLN: 0.0000 [IU] | Freq: Every day | INTRAMUSCULAR | Status: DC

## 2022-03-05 MED ORDER — ORAL CARE MOUTH RINSE: 15.0000 mL | OROMUCOSAL | Status: DC | PRN

## 2022-03-05 MED ORDER — ENOXAPARIN SODIUM 30 MG/0.3ML IJ SOSY
30.0000 mg | PREFILLED_SYRINGE | INTRAMUSCULAR | Status: DC
  Administered 2022-03-05: 30 mg via SUBCUTANEOUS
  Filled 2022-03-05: qty 0.3

## 2022-03-05 NOTE — Consult Note (Incomplete)
NEUROLOGY CONSULTATION NOTE   Date of service: March 05, 2022 Patient Name: [name] MRN:  [id number] DOB:  Sep 06, 1931 Reason for consult: "R isded weakness and confusion/did not know how to do tasks and ended up falling" Requesting Provider: Kayleen Memos, DO _ _ _   _ __   _ __ _ _  __ __   _ __   __ _  History of Present Illness  [name] is a 88 y.o. male with PMH significant for DM2, L carotid stenosis, mixed alzheimers and vascular dementia, HLD, CKD 3 who was making coffee when he was unable to screw the lid back with his R had, confused and did not know how to do tasks and trouble moving feet and fell to the right. Felt like his last stroke. He is back to his baseline.  CTH negative for ICH    ROS   Constitutional Denies weight loss, fever and chills. ***  HEENT Denies changes in vision and hearing. ***  Respiratory Denies SOB and cough. ***  CV Denies palpitations and CP ***  GI Denies abdominal pain, nausea, vomiting and diarrhea. ***  GU Denies dysuria and urinary frequency. ***  MSK Denies myalgia and joint pain. ***  Skin Denies rash and pruritus. ***  Neurological Denies headache and syncope. ***  Psychiatric Denies recent changes in mood. Denies anxiety and depression. ***   Past History   Past Medical History:  Diagnosis Date  . Abnormal Doppler ultrasound of carotid artery 12/16/2012   mildly abnormal doppler . no prior studies to compare to. follow up studies are recommended when clinically indicated.  . Acute respiratory failure with hypoxia ([name]) 11/2018  . Blepharitis   . Cancer ([place])   . Carotid stenosis, left 11/2018   on left 60%  . Diabetes mellitus without complication ([place])   . Exogenous obesity   . Gastric ulcer   . GI bleed   . History of stress test 06/06/2012   low risk scan. fixed inferior defect consistant with prior infarction with moderatly reduced EF. no appreciable evidence of ischemia. no significant change from previous  study  . Hx of echocardiogram 08-15-2012   EF  45-50 % very difficult study, severe LVH, pacemaker, trace TR,mildly enlarged RV,; aorta mildly calcified  . Pacemaker 2011   St. Jude Accent DDDR ; this is the third generator this pt. has had  . Shingles   . TIA (transient ischemic attack) 11/2018   vs stroke not seen on CT  . UTI (lower urinary tract infection)    Past Surgical History:  Procedure Laterality Date  . CARDIAC CATHETERIZATION  12-29-2003   rt. coronary angiography was done with a 6 french 4 cm taper coronary cath. This demonstrated  approximately 70 to 75% mildly segmental stenosis in the mid RCA before the acute margin          . CARDIAC CATHETERIZATION  11-19-2003   diagnostic cath,, pt. to get second cath to have stent placed  . cardiac stents    . CATARACT EXTRACTION    . KNEE SURGERY Right   . PPM GENERATOR CHANGEOUT N/A 04/07/2019   Procedure: PPM GENERATOR CHANGEOUT;  Surgeon: Sanda Klein, MD;  Location: [facility];  Service: Cardiovascular;  Laterality: N/A;  . PROSTATE BIOPSY    . prostate seed implants    . SPINE SURGERY    . WISDOM TOOTH EXTRACTION     Family History  Problem Relation Age of Onset  .  Heart disease Father    Social History   Socioeconomic History  . Marital status: Married    Spouse name: Not on file  . Number of children: 2  . Years of education: 41  . Highest education level: Not on file  Occupational History  . Not on file  Tobacco Use  . Smoking status: Never  . Smokeless tobacco: Former  Media planner  . Vaping Use: Never used  Substance and Sexual Activity  . Alcohol use: Yes    Comment: occasionally  . Drug use: No  . Sexual activity: Not Currently    Partners: Female  Other Topics Concern  . Not on file  Social History Narrative   Right handed      Lives with wife, son and daughter in law      Highest level of edu- doctorate   Drinks caffeine   3 story home   Social Determinants of Health   Financial  Resource Strain: Not on file  Food Insecurity: Not on file  Transportation Needs: Not on file  Physical Activity: Not on file  Stress: Not on file  Social Connections: Not on file   No Known Allergies  Medications   Facility-Administered Medications Prior to Admission  Medication Dose Route Frequency Provider Last [place]  . betamethasone acetate-betamethasone sodium phosphate (CELESTONE) injection 3 mg  3 mg Intramuscular Once Edrick Kins, DPM       Medications Prior to Admission  Medication Sig Dispense Refill Last Dose  . acetaminophen (TYLENOL) 500 MG tablet Take 500 mg by mouth 2 (two) times daily.     Marland Kitchen aspirin 81 MG tablet Take 81 mg by mouth every evening.      . BD VEO INSULIN SYRINGE U/F 31G X 15/64" 1 ML MISC USE TO ADMINSTER INSULIN SYRINGE TWICE DAILY     . bisacodyl (DULCOLAX) 5 MG EC tablet 3 tablets as needed     . calcitRIOL (ROCALTROL) 0.25 MCG capsule Take 0.25 mcg by mouth every Monday, Wednesday, and Friday. evening     . clopidogrel (PLAVIX) 75 MG tablet Take 1 tablet by mouth once daily 90 tablet 0   . Coenzyme Q10 (CO Q 10) 100 MG CAPS Take 100 mg by mouth daily in the afternoon.     . Continuous Blood Gluc Sensor (FREESTYLE LIBRE 3 SENSOR) MISC ON INSULIN TWICE A DAY. CHANGE SENSOR EVERY 14 DAYS     . Continuous Blood Gluc Sensor (FREESTYLE LIBRE 3 SENSOR) MISC      . Emollient (UDDERLY SMOOTH EX) Apply 1 application. topically daily. Apply to both feet     . famotidine (PEPCID) 20 MG tablet Take 1 tablet (20 mg total) by mouth daily. 30 tablet 1   . famotidine (PEPCID) 40 MG tablet Take 40 mg by mouth at bedtime.     . furosemide (LASIX) 20 MG tablet Take 1 tablet (20 mg total) by mouth daily as needed for edema. 30 tablet 1   . insulin NPH-regular Human (70-30) 100 UNIT/ML injection Inject 50 Units into the skin 2 (two) times daily with a meal. 20 mL 1   . irbesartan (AVAPRO) 150 MG tablet Take 1 tablet (150 mg total) by mouth daily. (Patient  taking differently: Take 100 mg by mouth daily.) 90 tablet 3   . levothyroxine (SYNTHROID, LEVOTHROID) 25 MCG tablet Take 25 mcg by mouth daily.      . memantine (NAMENDA) 10 MG tablet Take 1 tablet (10 mg total) by  mouth 2 (two) times daily. 60 tablet 11   . metoprolol succinate (TOPROL-XL) 25 MG 24 hr tablet Take 1 tablet (25 mg total) by mouth daily. 90 tablet 3   . Multiple Vitamin (RENAL MULTIVITAMIN/ZINC PO)      . nitroGLYCERIN (NITROSTAT) 0.4 MG SL tablet Place 1 tablet (0.4 mg total) under the tongue every 5 (five) minutes as needed for chest pain. 25 tablet 1   . ONETOUCH ULTRA test strip      . OVER THE COUNTER MEDICATION Take 1 capsule by mouth every evening. OTC vitamin for patients with kidney issues     . polyethylene glycol (MIRALAX / GLYCOLAX) 17 g packet Take 17 g by mouth daily. 14 each 0   . rosuvastatin (CRESTOR) 40 MG tablet Take 1 tablet by mouth once daily 90 tablet 0      Vitals   Vitals:   03/04/22 2135 03/04/22 2140 03/04/22 2145 03/04/22 2354  BP:    (!) 159/77  Pulse: 86 87 87 81  Resp: '17 20 16 18  '$ Temp:    (!) 97.5 F (36.4 C)  TempSrc:    Oral  SpO2: (!) 89% 96% 95% 96%  Weight:      Height:         Body mass index is 35.2 kg/m.  Physical Exam   General: Laying comfortably in bed; in no acute distress. *** HENT: Normal oropharynx and mucosa. Normal external appearance of ears and nose. *** Neck: Supple, no pain or tenderness *** CV: No JVD. No peripheral edema. *** Pulmonary: Symmetric Chest rise. Normal respiratory effort. *** Abdomen: Soft to touch, non-tender. *** Ext: No cyanosis, edema, or deformity *** Skin: No rash. Normal palpation of skin.  *** Musculoskeletal: Normal digits and nails by inspection. No clubbing. ***  Neurologic Examination  Mental status/Cognition: Alert, oriented to self, place, month and year, good attention. *** Speech/language: Fluent, comprehension intact, object naming intact, repetition intact. *** Cranial  nerves:   CN II Pupils equal and reactive to light, no VF deficits ***   CN III,IV,VI EOM intact, no gaze preference or deviation, no nystagmus ***   CN V normal sensation in V1, V2, and V3 segments bilaterally ***   CN VII no asymmetry, no nasolabial fold flattening ***   CN VIII normal hearing to speech ***   CN IX & X normal palatal elevation, no uvular deviation ***   CN XI 5/5 head turn and 5/5 shoulder shrug bilaterally ***   CN XII midline tongue protrusion ***   Motor:  Muscle bulk: ***, tone ***, pronator drift *** tremor *** Mvmt Root Nerve  Muscle Right Left Comments  SA C5/6 Ax Deltoid     EF C5/6 Mc Biceps     EE C6/7/8 Rad Triceps     WF C6/7 Med FCR     WE C7/8 PIN ECU     F Ab C8/T1 U ADM/FDI     HF L1/2/3 Fem Illopsoas     KE L2/3/4 Fem Quad     DF L4/5 D Peron Tib Ant     PF S1/2 Tibial Grc/Sol      Reflexes:  Right Left Comments  Pectoralis      Biceps (C5/6)     Brachioradialis (C5/6)      Triceps (C6/7)      Patellar (L3/4)      Achilles (S1)      Hoffman      Plantar     Jaw jerk  Sensation:  Light touch    Pin prick    Temperature    Vibration   Proprioception    Coordination/Complex Motor:  - Finger to Nose *** - Heel to shin *** - Rapid alternating movement *** - Gait: Stride length ***. Arm swing ***. Base width ***  Labs   CBC:  Recent Labs  Lab 03/04/22 1302  WBC 7.8  NEUTROABS 5.4  HGB 15.8  HCT 48.3  MCV 91.8  PLT 419    Basic Metabolic Panel:  Lab Results  Component Value Date   NA 138 03/04/2022   K 4.6 03/04/2022   CO2 26 03/04/2022   GLUCOSE 242 (H) 03/04/2022   BUN 35 (H) 03/04/2022   CREATININE 2.12 (H) 03/04/2022   CALCIUM 9.6 03/04/2022   GFRNONAA 29 (L) 03/04/2022   GFRAA 34 (L) 04/03/2019   Lipid Panel:  Lab Results  Component Value Date   LDLCALC 74 11/24/2018   HgbA1c:  Lab Results  Component Value Date   HGBA1C 7.1 (H) 05/02/2021   Urine Drug Screen:     Component Value Date/Time    LABOPIA NONE DETECTED 05/02/2021 0531   COCAINSCRNUR NONE DETECTED 05/02/2021 0531   LABBENZ NONE DETECTED 05/02/2021 0531   AMPHETMU NONE DETECTED 05/02/2021 0531   THCU NONE DETECTED 05/02/2021 0531   LABBARB NONE DETECTED 05/02/2021 0531    Alcohol Level     Component Value Date/Time   ETH <10 05/02/2021 0003    CT Head without contrast(Personally reviewed): ***  CT angio Head and Neck with contrast(Personally reviewed): ***  MR Angio head without contrast and Carotid Duplex BL(Personally reviewed): ***  MRI Brain(Personally reviewed): ***  [name]:  ***  Impression   [name] is a 88 y.o. male with PMH significant for ***. His neurologic examination is notable for ***.  Primary Diagnosis:  {Cerebral Infarction:22351}  Secondary Diagnosis: {Stroke Comorbidities:21266}  Recommendations  *** ______________________________________________________________________   Thank you for the opportunity to take part in the care of this patient. If you have any further questions, please contact the neurology consultation attending.  Signed,  [place] Pager Number [phone] _ _ _   _ __   _ __ _ _  __ __   _ __   __ _

## 2022-03-05 NOTE — Consult Note (Signed)
NEUROLOGY CONSULTATION NOTE   Date of service: March 05, 2022 Patient Name: [name] MRN:  [id number] DOB:  04-27-1932 Reason for consult: "R isded weakness and confusion/did not know how to do tasks and ended up falling" Requesting Provider: Kayleen Memos, DO _ _ _   _ __   _ __ _ _  __ __   _ __   __ _  History of Present Illness  JAUN GALLUZZO is a 88 y.o. male with PMH significant for DM2, L carotid stenosis, mixed alzheimers and vascular dementia, HLD, CKD 3 who presents with confusion, trouble moving his legs, fall and hit his R head. Felt like his last stroke. He is back to his baseline.  Reporst that he relies on his wife to setup his meds and insulin. Last night however, he setup his meds and he might have taken incorrect dosing of insulin. He has a continuous glucose monitor and his monitor alarm went off in the middle of the night and glucose was in 60s. He had his breakfast in the morning and then took half a dose of insulin. Immediately after his breakfast he got up and he was trying to screw in the mocca pot when he kept unscrewing the lid instead of tightening the lid. He failed at it 3 times and could not make sense of it. 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  He finally screwed the coffee pot and then walked over to the couch and felt like his legs were not moving fast enough and he was still confused in his head. He tripped over, fell forwards and hit his head on the right. He got up and was confused for a minute and had a bruise over his R head but was doing fine. He was able to walk with his walker to the car to come to the ED.  CTH negative for ICH    ROS   Constitutional Denies weight loss, fever and chills.   HEENT Denies changes in vision and hearing.   Respiratory Denies SOB and cough.   CV Denies palpitations and CP   GI Denies abdominal pain, nausea, vomiting and diarrhea.   GU Denies dysuria and  urinary frequency.   MSK Denies myalgia and joint pain.   Skin Denies rash and pruritus.   Neurological Denies headache and syncope.   Psychiatric Denies recent changes in mood. Denies anxiety and depression.    Past History   Past Medical History:  Diagnosis Date   Abnormal Doppler ultrasound of carotid artery 12/16/2012   mildly abnormal doppler . no prior studies to compare to. follow up studies are recommended when clinically indicated.   Acute respiratory failure with hypoxia ([facility]) 11/2018   Blepharitis    Cancer ([facility])    Carotid stenosis, left 11/2018   on left 60%   Diabetes mellitus without complication ([facility])    Exogenous obesity    Gastric ulcer    GI bleed    History of stress test 06/06/2012   low risk scan. fixed inferior defect consistant with prior infarction with moderatly reduced EF. no appreciable evidence of ischemia. no significant change from previous study   Hx of echocardiogram 08-15-2012   EF  45-50 % very difficult study, severe LVH, pacemaker, trace TR,mildly enlarged RV,; aorta mildly calcified   Pacemaker 2011   St. Jude Accent DDDR ; this is the third generator this pt. has  had   Shingles    TIA (transient ischemic attack) 11/2018   vs stroke not seen on CT   UTI (lower urinary tract infection)    Past Surgical History:  Procedure Laterality Date   CARDIAC CATHETERIZATION  12-29-2003   rt. coronary angiography was done with a 6 french 4 cm taper coronary cath. This demonstrated  approximately 70 to 75% mildly segmental stenosis in the mid RCA before the acute margin           CARDIAC CATHETERIZATION  11-19-2003   diagnostic cath,, pt. to get second cath to have stent placed   cardiac stents     CATARACT EXTRACTION     KNEE SURGERY Right    PPM GENERATOR CHANGEOUT N/A 04/07/2019   Procedure: PPM GENERATOR CHANGEOUT;  Surgeon: Sanda Klein, MD;  Location: [facility];  Service: Cardiovascular;  Laterality: N/A;   PROSTATE BIOPSY     prostate  seed implants     SPINE SURGERY     WISDOM TOOTH EXTRACTION     Family History  Problem Relation Age of Onset   Heart disease Father    Social History   Socioeconomic History   Marital status: Married    Spouse name: Not on file   Number of children: 2   Years of education: 20   Highest education level: Not on file  Occupational History   Not on file  Tobacco Use   Smoking status: Never   Smokeless tobacco: Former  Scientific laboratory technician Use: Never used  Substance and Sexual Activity   Alcohol use: Yes    Comment: occasionally   Drug use: No   Sexual activity: Not Currently    Partners: Female  Other Topics Concern   Not on file  Social History Narrative   Right handed      Lives with wife, son and daughter in law      Highest level of edu- doctorate   Drinks caffeine   3 story home   Social Determinants of Health   Financial Resource Strain: Not on file  Food Insecurity: Not on file  Transportation Needs: Not on file  Physical Activity: Not on file  Stress: Not on file  Social Connections: Not on file   No Known Allergies  Medications   Facility-Administered Medications Prior to Admission  Medication Dose Route Frequency Provider Last Rate Last Admin   betamethasone acetate-betamethasone sodium phosphate (CELESTONE) injection 3 mg  3 mg Intramuscular Once Edrick Kins, DPM       Medications Prior to Admission  Medication Sig Dispense Refill Last Dose   acetaminophen (TYLENOL) 500 MG tablet Take 500 mg by mouth 2 (two) times daily.      aspirin 81 MG tablet Take 81 mg by mouth every evening.       BD VEO INSULIN SYRINGE U/F 31G X 15/64" 1 ML MISC USE TO ADMINSTER INSULIN SYRINGE TWICE DAILY      bisacodyl (DULCOLAX) 5 MG EC tablet 3 tablets as needed      calcitRIOL (ROCALTROL) 0.25 MCG capsule Take 0.25 mcg by mouth every Monday, Wednesday, and Friday. evening      clopidogrel (PLAVIX) 75 MG tablet Take 1 tablet by mouth once daily 90 tablet 0     Coenzyme Q10 (CO Q 10) 100 MG CAPS Take 100 mg by mouth daily in the afternoon.      Continuous Blood Gluc Sensor (FREESTYLE LIBRE 3 SENSOR) MISC ON INSULIN TWICE A  DAY. CHANGE SENSOR EVERY 14 DAYS      Continuous Blood Gluc Sensor (FREESTYLE LIBRE 3 SENSOR) MISC       Emollient (UDDERLY SMOOTH EX) Apply 1 application. topically daily. Apply to both feet      famotidine (PEPCID) 20 MG tablet Take 1 tablet (20 mg total) by mouth daily. 30 tablet 1    famotidine (PEPCID) 40 MG tablet Take 40 mg by mouth at bedtime.      furosemide (LASIX) 20 MG tablet Take 1 tablet (20 mg total) by mouth daily as needed for edema. 30 tablet 1    insulin NPH-regular Human (70-30) 100 UNIT/ML injection Inject 50 Units into the skin 2 (two) times daily with a meal. 20 mL 1    irbesartan (AVAPRO) 150 MG tablet Take 1 tablet (150 mg total) by mouth daily. (Patient taking differently: Take 100 mg by mouth daily.) 90 tablet 3    levothyroxine (SYNTHROID, LEVOTHROID) 25 MCG tablet Take 25 mcg by mouth daily.       memantine (NAMENDA) 10 MG tablet Take 1 tablet (10 mg total) by mouth 2 (two) times daily. 60 tablet 11    metoprolol succinate (TOPROL-XL) 25 MG 24 hr tablet Take 1 tablet (25 mg total) by mouth daily. 90 tablet 3    Multiple Vitamin (RENAL MULTIVITAMIN/ZINC PO)       nitroGLYCERIN (NITROSTAT) 0.4 MG SL tablet Place 1 tablet (0.4 mg total) under the tongue every 5 (five) minutes as needed for chest pain. 25 tablet 1    ONETOUCH ULTRA test strip       OVER THE COUNTER MEDICATION Take 1 capsule by mouth every evening. OTC vitamin for patients with kidney issues      polyethylene glycol (MIRALAX / GLYCOLAX) 17 g packet Take 17 g by mouth daily. 14 each 0    rosuvastatin (CRESTOR) 40 MG tablet Take 1 tablet by mouth once daily 90 tablet 0      Vitals   Vitals:   03/04/22 2135 03/04/22 2140 03/04/22 2145 03/04/22 2354  BP:    (!) 159/77  Pulse: 86 87 87 81  Resp: '17 20 16 18  '$ Temp:    (!) 97.5 F (36.4 C)   TempSrc:    Oral  SpO2: (!) 89% 96% 95% 96%  Weight:      Height:         Body mass index is 35.2 kg/m.  Physical Exam   General: Laying comfortably in bed; in no acute distress.  HENT: Normal oropharynx and mucosa. Normal external appearance of ears and nose. Periorbital bruise and edema over right cheek, temple and around the R ye. Neck: Supple, no pain or tenderness  CV: No JVD. No peripheral edema.  Pulmonary: Symmetric Chest rise. Normal respiratory effort.  Abdomen: Soft to touch, non-tender.  Ext: No cyanosis, edema, or deformity  Skin: No rash. Normal palpation of skin.   Musculoskeletal: Normal digits and nails by inspection. No clubbing.   Neurologic Examination  Mental status/Cognition: Alert, oriented to self, place, month and year, good attention.  Speech/language: Fluent, comprehension intact, object naming intact, repetition intact.  Cranial nerves:   CN II Pupils equal and reactive to light, no VF deficits    CN III,IV,VI EOM intact, no gaze preference or deviation, no nystagmus    CN V normal sensation in V1, V2, and V3 segments bilaterally    CN VII no asymmetry, no nasolabial fold flattening    CN VIII normal hearing to  speech    CN IX & X normal palatal elevation, no uvular deviation    CN XI 5/5 head turn and 5/5 shoulder shrug bilaterally    CN XII midline tongue protrusion    Motor:  Muscle bulk: normal, tone normal, pronator drift none tremor none Mvmt Root Nerve  Muscle Right Left Comments  SA C5/6 Ax Deltoid 3 5   EF C5/6 Mc Biceps 5 5   EE C6/7/8 Rad Triceps 5 5   WF C6/7 Med FCR     WE C7/8 PIN ECU     F Ab C8/T1 U ADM/FDI 5 5   HF L1/2/3 Fem Illopsoas 5 5   KE L2/3/4 Fem Quad 5 5   DF L4/5 D Peron Tib Ant 5 5   PF S1/2 Tibial Grc/Sol 5 5    Sensation:  Light touch Intact throughout   Pin prick    Temperature    Vibration   Proprioception    Coordination/Complex Motor:  - Finger to Nose intact BL - Heel to shin intact BL - Rapid  alternating movement are normal - Gait: Deferred.  Labs   CBC:  Recent Labs  Lab 03/04/22 1302  WBC 7.8  NEUTROABS 5.4  HGB 15.8  HCT 48.3  MCV 91.8  PLT 440    Basic Metabolic Panel:  Lab Results  Component Value Date   NA 138 03/04/2022   K 4.6 03/04/2022   CO2 26 03/04/2022   GLUCOSE 242 (H) 03/04/2022   BUN 35 (H) 03/04/2022   CREATININE 2.12 (H) 03/04/2022   CALCIUM 9.6 03/04/2022   GFRNONAA 29 (L) 03/04/2022   GFRAA 34 (L) 04/03/2019   Lipid Panel:  Lab Results  Component Value Date   LDLCALC 74 11/24/2018   HgbA1c:  Lab Results  Component Value Date   HGBA1C 7.1 (H) 05/02/2021   Urine Drug Screen:     Component Value Date/Time   LABOPIA NONE DETECTED 05/02/2021 0531   COCAINSCRNUR NONE DETECTED 05/02/2021 0531   LABBENZ NONE DETECTED 05/02/2021 0531   AMPHETMU NONE DETECTED 05/02/2021 [place] DETECTED 05/02/2021 0531   LABBARB NONE DETECTED 05/02/2021 0531    Alcohol Level     Component Value Date/Time   ETH <10 05/02/2021 0003    CT Head without contrast(Personally reviewed): CTH was negative for a large hypodensity concerning for a large territory infarct or hyperdensity concerning for an ICH  CT angio Head and Neck with contrast: Deferred due to CKD 4  MRI Brain(Personally reviewed): Unable to get due to incompatible pacemaker.  Impression   [name] is a 88 y.o. male with PMH significant for DM2, mixed alzheimers and vascular dementia, HLD, CKD 3 who presents with confusion, trouble moving his legs, fall and hit his R head. The episode was brief and with no focal deficit. He describes that this felt like his prior stroke but does not endorse any focal deficits with this episode. Reports that he might have messed up his insulin dosing last night and this morning, woke up to his continuous blood glucose monitor alarm showing hypoglycemia. However, did have his breakfast prior to this episode and denies sweating, palpitations  and restlessness which is what he typically gets with hypoglycemia. Does report that the team at [facility] got orthostatic vitals on him but none documented.  Impression: Episode of confusion of unclear etiology.   Differential includes hypoglycemia specially given concern that he incorrectly dosed his insulin the night before, orthostasis due to  autonomic dysfunction in the setting of long standing diabetes with neuropathy. Unlikely stroke or tia given that description seems more consistent with confusion, inability to focus/pay attention rather than aphasia and he denies any unilateral weakness, felt both his legs were weak.  Recommendations  - Recommend repeat Orthostatic vitals - Recommend TSH, Cortisol levels - Pace maker interrogation. ______________________________________________________________________   Thank you for the opportunity to take part in the care of this patient. If you have any further questions, please contact the neurology consultation attending.  Signed,  [place] Pager Number [phone] _ _ _   _ __   _ __ _ _  __ __   _ __   __ _

## 2022-03-05 NOTE — Progress Notes (Signed)
Order to discharge pt home.  Discharge instructions/AVS given to patient and reviewed - education provided as needed. Pt advised to call PCP and/or come back to the hospital if there are any problems. Pt verbalized understanding.     Pt waiting on daughter-in-law and son to transport home.

## 2022-03-05 NOTE — Plan of Care (Signed)

## 2022-03-05 NOTE — Progress Notes (Signed)
Received pt with transport team, male pt alert oriented not in distress, transferred to room 38, pt ambulated from stretcher inside the room to bed, with minimal assist , activities well tolerated, connected pt to telemetry box 21 informed tele dept, witnessed by NA Mia, bed cath given, skin integrity assessment done and verified by RN Judson Roch, son came and talked with the patient, orient pt to room and use of call bell, pt agreed to call first before going out of bed

## 2022-03-05 NOTE — Plan of Care (Signed)

## 2022-03-05 NOTE — Progress Notes (Signed)
Patient admitted earlier this morning.  Patient seen and examined.  H&P reviewed.  Patient mentioned that he feels quite well this morning.  Denies any weakness on any one side.  Denies any headaches.  No chest pain or shortness of breath.  Vital signs noted to be stable.  Blood pressure 153/80  Lungs are clear to auscultation bilaterally S1-S2 is normal regular Abdomen is soft.  Nontender nondistended. No facial asymmetry.  Motor strength equal bilateral upper and lower extremities.  Patient admitted after he fell at home.  There was concern for TIA.  Seen by neurology who was not convinced of neurological process at this time.  Cannot have MRI due to incompatible pacemaker.  No neurological deficits noted on examination this morning.  Carotid Dopplers pending.  Echocardiogram not recommended by neurology.  Last echo was in December 2022 which showed a EF of 40 to 45%. Await pacemaker interrogation LDL noted to be 47.  He is already on statin.  TSH and cortisol level normal.  HbA1c 9.1.  Await PT and OT evaluation.  Other medical issues appear to be stable for the most part.  Waiting on carotid Doppler studies, pacemaker interrogation and PT evaluation.  Discussed with patient's wife and son.  Hopefully discharge later today.  Bonnielee Haff 03/05/2022

## 2022-03-05 NOTE — Evaluation (Signed)
Physical Therapy Evaluation Patient Details Name: [name] MRN: [id number] DOB: [birth year] Today's Date: 03/05/2022  History of Present Illness  Pt is an 88 y.o male presenting to [facility] ED on 03/04/2022 after a fall striking the R side of his face. PMH includes ARF, CA, DM, pacemaker, TIA, CAD, CKD, HTN, OSA, DJD, mixed alzheimers and vascular dementia.   Clinical Impression  Pt in bed upon arrival of PT, agreeable to evaluation at this time. Prior to admission the pt was independent with use of rollator or cane, but also reports furniture walking in the home for improved stability. The pt reports one other fall in last 6 months in which he required assist of multiple people to recover. The pt was able to complete sit-stand transfers and hallway ambulation with minG with use of RW, but demos mild instability and was dependent on BUE support at this time due to imbalance. He will be able to return home with family assist, but recommend HHPT to improve dynamic stability and further reduce risk of falls.     Recommendations for follow up therapy are one component of a multi-disciplinary discharge planning process, led by the attending physician.  Recommendations may be updated based on patient status, additional functional criteria and insurance authorization.  Follow Up Recommendations Home health PT      Assistance Recommended at Discharge Intermittent Supervision/Assistance  Patient can return home with the following  A little help with walking and/or transfers;Direct supervision/assist for medications management;Direct supervision/assist for financial management;Assist for transportation;Help with stairs or ramp for entrance    Equipment Recommendations None recommended by PT  Recommendations for Other Services       Functional Status Assessment Patient has had a recent decline in their functional status and demonstrates the ability to make significant improvements in function in a  reasonable and predictable amount of time.     Precautions / Restrictions Precautions Precautions: Fall Restrictions Weight Bearing Restrictions: No      Mobility  Bed Mobility Overal bed mobility: Needs Assistance Bed Mobility: Supine to Sit     Supine to sit: Min assist     General bed mobility comments: pt reports he usually uses momentum in BLE to sit up, needed bed rail today    Transfers Overall transfer level: Needs assistance Equipment used: Rolling walker (2 wheels) Transfers: Sit to/from Stand Sit to Stand: Min guard           General transfer comment: minG with cues for safety    Ambulation/Gait Ambulation/Gait assistance: Min guard Gait Distance (Feet): 150 Feet Assistive device: Rolling walker (2 wheels) Gait Pattern/deviations: Step-through pattern, Decreased stride length, Trunk flexed Gait velocity: decreased Gait velocity interpretation: <1.31 ft/sec, indicative of household ambulator   General Gait Details: pt with slowed gait, reports he feels less stable comapred to baseline but no overt LOB.  Stairs Stairs: Yes Stairs assistance: Min guard Stair Management: Two rails, Alternating pattern, Forwards Number of Stairs: 2 General stair comments: able to complete with BUE support on rails     Balance Overall balance assessment: Needs assistance Sitting-balance support: No upper extremity supported, Feet supported Sitting balance-Leahy Scale: Good Sitting balance - Comments: Able to reach to feet sitting EOB   Standing balance support: Bilateral upper extremity supported, No upper extremity supported, During functional activity Standing balance-Leahy Scale: Fair Standing balance comment: min guard A for safety with RW and during bil static standing tasks  Pertinent Vitals/Pain Pain Assessment Pain Assessment: Faces Faces Pain Scale: No hurt    Home Living Family/patient expects to be discharged  to:: Private residence Living Arrangements: Spouse/significant other;Children Available Help at Discharge: Family;Available 24 hours/day Type of Home: House Home Access: Level entry     Alternate Level Stairs-Number of Steps: 16 Home Layout: Multi-level;Able to live on main level with bedroom/bathroom Home Equipment: Cane - single point;Rollator (4 wheels);Shower seat;Grab bars - tub/shower;Grab bars - toilet      Prior Function Prior Level of Function : Independent/Modified Independent             Mobility Comments: rollator ADLs Comments: Independent but does not shower often; usually sponge bathes     Hand Dominance   Dominant Hand: Right    Extremity/Trunk Assessment   Upper Extremity Assessment Upper Extremity Assessment: Defer to OT evaluation RUE Deficits / Details: decreased shoulder flexion ~70 degrees    Lower Extremity Assessment Lower Extremity Assessment: Overall WFL for tasks assessed (hx of neuropathy at baseline)    Cervical / Trunk Assessment Cervical / Trunk Assessment: Kyphotic  Communication   Communication: HOH  Cognition Arousal/Alertness: Awake/alert Behavior During Therapy: WFL for tasks assessed/performed Overall Cognitive Status: No family/caregiver present to determine baseline cognitive functioning                                 General Comments: pt slightly impulsive but following instructions well. Noted STM deficits but likely close to baseline given pt hx fo dementia.        General Comments General comments (skin integrity, edema, etc.): VSS on RA        Assessment/Plan    PT Assessment Patient needs continued PT services  PT Problem List Decreased strength;Decreased range of motion;Decreased activity tolerance;Decreased balance;Decreased coordination       PT Treatment Interventions DME instruction;Gait training;Stair training;Functional mobility training;Therapeutic activities;Therapeutic  exercise;Balance training;Neuromuscular re-education;Patient/family education    PT Goals (Current goals can be found in the Care Plan section)  Acute Rehab PT Goals Patient Stated Goal: return home asap PT Goal Formulation: With patient Time For Goal Achievement: 03/19/22 Potential to Achieve Goals: Good    Frequency Min 3X/week        AM-PAC PT "6 Clicks" Mobility  Outcome Measure Help needed turning from your back to your side while in a flat bed without using bedrails?: A Little Help needed moving from lying on your back to sitting on the side of a flat bed without using bedrails?: A Little Help needed moving to and from a bed to a chair (including a wheelchair)?: A Little Help needed standing up from a chair using your arms (e.g., wheelchair or bedside chair)?: A Little Help needed to walk in hospital room?: A Little Help needed climbing 3-5 steps with a railing? : A Little 6 Click Score: 18    End of Session Equipment Utilized During Treatment: Gait belt Activity Tolerance: Patient tolerated treatment well Patient left: in chair;with call bell/phone within reach;with chair alarm set Nurse Communication: Mobility status PT Visit Diagnosis: Other abnormalities of gait and mobility (R26.89);Repeated falls (R29.6);Muscle weakness (generalized) (M62.81)    Time: 3295-1884 PT Time Calculation (min) (ACUTE ONLY): 23 min   Charges:   PT Evaluation $PT Eval Low Complexity: 1 Low PT Treatments $Therapeutic Exercise: 8-22 mins        West Carbo, PT, DPT   Acute Rehabilitation Department  Sandra Cockayne  03/05/2022, 11:40 AM

## 2022-03-05 NOTE — Discharge Summary (Signed)
[facility]  Physician Discharge Summary   Patient ID: [name] MRN: [id number] DOB/AGE: [birth year] 88 y.o.  Admit date: 03/04/2022 Discharge date: 03/05/2022    PCP: [name], MD  DISCHARGE DIAGNOSES:  Transient confusion of unclear etiology Diabetes mellitus type 2 Coronary artery disease Hypothyroidism Mild dementia Essential hypertension   RECOMMENDATIONS FOR OUTPATIENT FOLLOW UP: Follow-up with primary care provider   Home Health: PT Equipment/Devices: None  CODE STATUS: Full code  DISCHARGE CONDITION: fair  Diet recommendation: As before  INITIAL HISTORY:  [name] is a 88 y.o. male with medical history significant for coronary artery disease status post stent placement on DAPT, TIA in 2020, OSA, DJD, hypertension, hyperlipidemia, DM 2, CKD 4, chronic combined systolic and diastolic CHF, sick sinus syndrome/complete heart block status post pacemaker placement, chronic anxiety/depression, hypothyroidism, mild dementia, who initially presented to [facility] ED after a fall and lack of coordination.  The patient reports that he was making coffee and then was having some difficulty closing the coffee lid which had not happened in the past.  Unclear of last known well.  Upon arrival to the ED his symptoms had resolved.  EDP discussed the case with neurology who recommended admission for TIA work-up.  The patient was admitted by [facility], hospitalist service.   At time of visit, the patient is back to his baseline.   ED Course: Tmax 98.2.  BP 139/77, pulse 81, respiratory 18, O2 saturation 96% on room air.  Lab studies remarkable for serum glucose 242, BUN 35, creatinine 2.1, GFR 29.  CBC essentially unremarkable.  UA negative for pyuria.  Consultations: Neurology  Procedures: Carotid Dopplers  HOSPITAL COURSE:   He was admitted with transient confusion of unclear etiology.  He also sustained a fall at home.  There was some concern for TIA.   Patient was seen by neurology who was not convinced of a neurological etiology.  Patient cannot have MRI due to incompatible pacemaker.  No neurological deficits noted on examination.  Carotid Dopplers unremarkable for any significant stenosis.  Underwent pacemaker interrogation which did not show any arrhythmia.  LDL 47.  He is already on statin.  TSH and cortisol levels were normal.  HbA1c 9.1.  Seen by PT.  Home health is recommended.  Other medical issues are stable including essential hypertension, diabetes mellitus type 2 with hyperglycemia, hypothyroidism, coronary artery disease.  See H&P for further details.  Obesity  Estimated body mass index is 35.2 kg/m as calculated from the following:   Height as of this encounter: '5\' 8"'$  (1.727 m).   Weight as of this encounter: 105 kg.  Patient is stable for discharge home today.   PERTINENT LABS:  The results of significant diagnostics from this hospitalization (including imaging, microbiology, ancillary and laboratory) are listed below for reference.     Labs:   Basic Metabolic Panel: Recent Labs  Lab 03/04/22 1302 03/05/22 0640  NA 138 135  K 4.6 4.4  CL 101 102  CO2 26 23  GLUCOSE 242* 257*  BUN 35* 31*  CREATININE 2.12* 2.06*  CALCIUM 9.6 9.1  MG  --  2.2  PHOS  --  3.3   Liver Function Tests: Recent Labs  Lab 03/04/22 1302  AST 24  ALT 20  ALKPHOS 55  BILITOT 0.6  PROT 7.5  ALBUMIN 4.0    CBC: Recent Labs  Lab 03/04/22 1302 03/05/22 0640  WBC 7.8 8.3  NEUTROABS 5.4  --   HGB 15.8 15.3  HCT  48.3 45.9  MCV 91.8 90.5  PLT 156 147*    CBG: Recent Labs  Lab 03/04/22 2146 03/05/22 0627 03/05/22 0840 03/05/22 1143  GLUCAP 167* 288* 248* 283*     IMAGING STUDIES VAS US CAROTID  Result Date: 03/06/2022 Carotid Arterial Duplex Study Patient Name:  [name]  Date of Exam:   03/05/2022 Medical Rec #: [id number]       Accession #:    [id number] Date of Birth: [birth year]       Patient Gender: M  Patient Age:   20 years Exam Location:  [facility] Procedure:      VAS US CAROTID Referring Phys: Irene Pap --------------------------------------------------------------------------------  Indications:       Fall. Risk Factors:      Hypertension, hyperlipidemia, Diabetes, coronary artery                    disease. Other Factors:     CHF. SSS with pacemaker. Limitations        Today's exam was limited due to the body habitus of the                    patient and patient states "neck is stiff" and is unable to                    turn head. Comparison Study:  Prior carotid duplex done 05/02/21 indicated 1-39% ICA                    stenosis, bilaterally Performing Technologist: Sharion Dove RVS  Examination Guidelines: A complete evaluation includes B-mode imaging, spectral Doppler, color Doppler, and power Doppler as needed of all accessible portions of each vessel. Bilateral testing is considered an integral part of a complete examination. Limited examinations for reoccurring indications may be performed as noted.  Right Carotid Findings: +----------+--------+--------+--------+------------------+------------------+           PSV cm/sEDV cm/sStenosisPlaque DescriptionComments           +----------+--------+--------+--------+------------------+------------------+ CCA Prox  108     24                                intimal thickening +----------+--------+--------+--------+------------------+------------------+ CCA Distal78      14                                intimal thickening +----------+--------+--------+--------+------------------+------------------+ ICA Prox  66      12      1-39%   calcific                             +----------+--------+--------+--------+------------------+------------------+ ICA Mid   119     22                                                   +----------+--------+--------+--------+------------------+------------------+ ICA Distal85       19                                                   +----------+--------+--------+--------+------------------+------------------+  ECA       77      4               calcific                             +----------+--------+--------+--------+------------------+------------------+ +----------+--------+-------+--------+-------------------+           PSV cm/sEDV cmsDescribeArm Pressure (mmHG) +----------+--------+-------+--------+-------------------+ [id number]                                        +----------+--------+-------+--------+-------------------+ +---------+--------+---+--------+--+ VertebralPSV cm/s112EDV cm/s20 +---------+--------+---+--------+--+  Left Carotid Findings: +----------+--------+--------+--------+---------------------+------------------+           PSV cm/sEDV cm/sStenosisPlaque Description   Comments           +----------+--------+--------+--------+---------------------+------------------+ CCA Prox  83      10                                   intimal thickening +----------+--------+--------+--------+---------------------+------------------+ CCA Distal75      14              calcific                                +----------+--------+--------+--------+---------------------+------------------+ ICA Prox  122     23      1-39%   calcific and         Shadowing                                            irregular                               +----------+--------+--------+--------+---------------------+------------------+ ICA Mid   118     29                                                      +----------+--------+--------+--------+---------------------+------------------+ ICA Distal98      24                                                      +----------+--------+--------+--------+---------------------+------------------+ ECA       92      3                                                        +----------+--------+--------+--------+---------------------+------------------+ +----------+--------+--------+--------+-------------------+           PSV cm/sEDV cm/sDescribeArm Pressure (mmHG) +----------+--------+--------+--------+-------------------+ [id number]                                         +----------+--------+--------+--------+-------------------+ +---------+--------+--+--------+-+  VertebralPSV cm/s36EDV cm/s6 +---------+--------+--+--------+-+   Summary: Right Carotid: Velocities in the right ICA are consistent with a 1-39% stenosis. Left Carotid: Velocities in the left ICA are consistent with a 1-39% stenosis. Vertebrals:  Bilateral vertebral arteries demonstrate antegrade flow. Subclavians: Normal flow hemodynamics were seen in bilateral subclavian              arteries. *See table(s) above for measurements and observations.  Electronically signed by Antony Contras MD on 03/06/2022 at 10:40:55 AM.    Final    CT Head Wo Contrast  Result Date: 03/04/2022 CLINICAL DATA:  Fall EXAM: CT HEAD WITHOUT CONTRAST CT MAXILLOFACIAL WITHOUT CONTRAST CT CERVICAL SPINE WITHOUT CONTRAST TECHNIQUE: Multidetector CT imaging of the head, cervical spine, and maxillofacial structures were performed using the standard protocol without intravenous contrast. Multiplanar CT image reconstructions of the cervical spine and maxillofacial structures were also generated. RADIATION DOSE REDUCTION: This exam was performed according to the departmental dose-optimization program which includes automated exposure control, adjustment of the mA and/or kV according to patient size and/or use of iterative reconstruction technique. COMPARISON:  05/10/2021 CT head; no prior CT cervical spine or maxillofacial FINDINGS: CT HEAD FINDINGS Brain: No evidence of acute infarct, hemorrhage, mass, mass effect, or midline shift. No hydrocephalus or extra-axial fluid collection. Periventricular white matter changes, likely  the sequela of chronic small vessel ischemic disease. Age related cerebral atrophy. Basal ganglia calcifications. Vascular: No hyperdense vessel. Skull: Normal. Negative for fracture or focal lesion. CT MAXILLOFACIAL FINDINGS Osseous: No fracture or mandibular dislocation. No destructive process. Orbits: Negative. No traumatic or inflammatory finding. Status post bilateral lens replacements. Sinuses: Mild mucosal thickening in the right maxillary sinus, with osseous thickening, likely chronic right maxillary sinusitis. Paranasal sinuses otherwise clear. The mastoids are well aerated. Soft tissues: Right periorbital and premalar hematoma, which measures up to 2.2 x 1.9 x 3.2 cm (AP x TR x CC). CT CERVICAL SPINE FINDINGS Alignment: Straightening and mild reversal of the normal cervical lordosis. No listhesis. Skull base and vertebrae: No acute fracture. No suspicious osseous lesion. Diffuse osseous bridging throughout the cervical spine. Soft tissues and spinal canal: No prevertebral fluid or swelling. No visible canal hematoma. Disc levels:  No high-grade spinal canal stenosis. Upper chest: Negative. Other: Small hypoenhancing nodules in the thyroid (< 1.5 cm), for which no follow-up is indicated. (Reference: J Am Coll Radiol. 2015 Feb;12(2): 143-50) IMPRESSION: 1. No acute intracranial process. 2. Right periorbital and premalar hematoma. No acute facial bone fracture. 3. No acute fracture or traumatic listhesis in the cervical spine. Diffuse osseous bridging is noted throughout the cervical spine, possibly diffuse idiopathic skeletal hyperostosis. Electronically Signed   By: Merilyn Baba M.D.   On: 03/04/2022 13:42   CT Cervical Spine Wo Contrast  Result Date: 03/04/2022 CLINICAL DATA:  Fall EXAM: CT HEAD WITHOUT CONTRAST CT MAXILLOFACIAL WITHOUT CONTRAST CT CERVICAL SPINE WITHOUT CONTRAST TECHNIQUE: Multidetector CT imaging of the head, cervical spine, and maxillofacial structures were performed using the  standard protocol without intravenous contrast. Multiplanar CT image reconstructions of the cervical spine and maxillofacial structures were also generated. RADIATION DOSE REDUCTION: This exam was performed according to the departmental dose-optimization program which includes automated exposure control, adjustment of the mA and/or kV according to patient size and/or use of iterative reconstruction technique. COMPARISON:  05/10/2021 CT head; no prior CT cervical spine or maxillofacial FINDINGS: CT HEAD FINDINGS Brain: No evidence of acute infarct, hemorrhage, mass, mass effect, or midline shift. No hydrocephalus or extra-axial fluid collection. Periventricular white  matter changes, likely the sequela of chronic small vessel ischemic disease. Age related cerebral atrophy. Basal ganglia calcifications. Vascular: No hyperdense vessel. Skull: Normal. Negative for fracture or focal lesion. CT MAXILLOFACIAL FINDINGS Osseous: No fracture or mandibular dislocation. No destructive process. Orbits: Negative. No traumatic or inflammatory finding. Status post bilateral lens replacements. Sinuses: Mild mucosal thickening in the right maxillary sinus, with osseous thickening, likely chronic right maxillary sinusitis. Paranasal sinuses otherwise clear. The mastoids are well aerated. Soft tissues: Right periorbital and premalar hematoma, which measures up to 2.2 x 1.9 x 3.2 cm (AP x TR x CC). CT CERVICAL SPINE FINDINGS Alignment: Straightening and mild reversal of the normal cervical lordosis. No listhesis. Skull base and vertebrae: No acute fracture. No suspicious osseous lesion. Diffuse osseous bridging throughout the cervical spine. Soft tissues and spinal canal: No prevertebral fluid or swelling. No visible canal hematoma. Disc levels:  No high-grade spinal canal stenosis. Upper chest: Negative. Other: Small hypoenhancing nodules in the thyroid (< 1.5 cm), for which no follow-up is indicated. (Reference: J Am Coll Radiol. 2015  Feb;12(2): 143-50) IMPRESSION: 1. No acute intracranial process. 2. Right periorbital and premalar hematoma. No acute facial bone fracture. 3. No acute fracture or traumatic listhesis in the cervical spine. Diffuse osseous bridging is noted throughout the cervical spine, possibly diffuse idiopathic skeletal hyperostosis. Electronically Signed   By: Merilyn Baba M.D.   On: 03/04/2022 13:42   CT Maxillofacial Wo Contrast  Result Date: 03/04/2022 CLINICAL DATA:  Fall EXAM: CT HEAD WITHOUT CONTRAST CT MAXILLOFACIAL WITHOUT CONTRAST CT CERVICAL SPINE WITHOUT CONTRAST TECHNIQUE: Multidetector CT imaging of the head, cervical spine, and maxillofacial structures were performed using the standard protocol without intravenous contrast. Multiplanar CT image reconstructions of the cervical spine and maxillofacial structures were also generated. RADIATION DOSE REDUCTION: This exam was performed according to the departmental dose-optimization program which includes automated exposure control, adjustment of the mA and/or kV according to patient size and/or use of iterative reconstruction technique. COMPARISON:  05/10/2021 CT head; no prior CT cervical spine or maxillofacial FINDINGS: CT HEAD FINDINGS Brain: No evidence of acute infarct, hemorrhage, mass, mass effect, or midline shift. No hydrocephalus or extra-axial fluid collection. Periventricular white matter changes, likely the sequela of chronic small vessel ischemic disease. Age related cerebral atrophy. Basal ganglia calcifications. Vascular: No hyperdense vessel. Skull: Normal. Negative for fracture or focal lesion. CT MAXILLOFACIAL FINDINGS Osseous: No fracture or mandibular dislocation. No destructive process. Orbits: Negative. No traumatic or inflammatory finding. Status post bilateral lens replacements. Sinuses: Mild mucosal thickening in the right maxillary sinus, with osseous thickening, likely chronic right maxillary sinusitis. Paranasal sinuses otherwise  clear. The mastoids are well aerated. Soft tissues: Right periorbital and premalar hematoma, which measures up to 2.2 x 1.9 x 3.2 cm (AP x TR x CC). CT CERVICAL SPINE FINDINGS Alignment: Straightening and mild reversal of the normal cervical lordosis. No listhesis. Skull base and vertebrae: No acute fracture. No suspicious osseous lesion. Diffuse osseous bridging throughout the cervical spine. Soft tissues and spinal canal: No prevertebral fluid or swelling. No visible canal hematoma. Disc levels:  No high-grade spinal canal stenosis. Upper chest: Negative. Other: Small hypoenhancing nodules in the thyroid (< 1.5 cm), for which no follow-up is indicated. (Reference: J Am Coll Radiol. 2015 Feb;12(2): 143-50) IMPRESSION: 1. No acute intracranial process. 2. Right periorbital and premalar hematoma. No acute facial bone fracture. 3. No acute fracture or traumatic listhesis in the cervical spine. Diffuse osseous bridging is noted throughout the cervical spine, possibly  diffuse idiopathic skeletal hyperostosis. Electronically Signed   By: Merilyn Baba M.D.   On: 03/04/2022 13:42   DG Chest 2 View  Result Date: 03/04/2022 CLINICAL DATA:  Possible syncope.  Fall. EXAM: CHEST - 2 VIEW COMPARISON:  10/01/2021 FINDINGS: 1319 hours. Low volume film. The cardio pericardial silhouette is enlarged. The lungs are clear without focal pneumonia, edema, pneumothorax or pleural effusion. Right permanent pacemaker noted. Telemetry leads overlie the chest. IMPRESSION: Low volume film without acute cardiopulmonary findings. Electronically Signed   By: Misty Stanley M.D.   On: 03/04/2022 13:34    DISCHARGE EXAMINATION: Vitals:   03/04/22 2354 03/05/22 0348 03/05/22 0742 03/05/22 1125  BP: (!) 159/77 122/80 (!) 153/80 124/74  Pulse: 81 90 96 94  Resp: '18 18 18 19  '$ Temp: (!) 97.5 F (36.4 C) 98.4 F (36.9 C) 98.6 F (37 C) 97.6 F (36.4 C)  TempSrc: Oral Oral Oral Oral  SpO2: 96% 96% 91% (!) 89%  Weight:      Height:        General appearance: Awake alert.  In no distress Resp: Clear to auscultation bilaterally.  Normal effort Cardio: S1-S2 is normal regular.  No S3-S4.  No rubs murmurs or bruit GI: Abdomen is soft.  Nontender nondistended.  Bowel sounds are present normal.  No masses organomegaly   DISPOSITION: Home  Discharge Instructions     Call MD for:  difficulty breathing, headache or visual disturbances   Complete by: As directed    Call MD for:  extreme fatigue   Complete by: As directed    Call MD for:  persistant dizziness or light-headedness   Complete by: As directed    Call MD for:  persistant nausea and vomiting   Complete by: As directed    Call MD for:  severe uncontrolled pain   Complete by: As directed    Call MD for:  temperature >100.4   Complete by: As directed    Diet Carb Modified   Complete by: As directed    Discharge instructions   Complete by: As directed    Please be sure to follow-up with your primary care provider within 1 week.  Your pacemaker interrogation did not show any concerning arrhythmias.  Please take your medications as prescribed.  Please seek attention if symptoms recur.  You were cared for by a hospitalist during your hospital stay. If you have any questions about your discharge medications or the care you received while you were in the hospital after you are discharged, you can call the unit and asked to speak with the hospitalist on call if the hospitalist that took care of you is not available. Once you are discharged, your primary care physician will handle any further medical issues. Please note that NO REFILLS for any discharge medications will be authorized once you are discharged, as it is imperative that you return to your primary care physician (or establish a relationship with a primary care physician if you do not have one) for your aftercare needs so that they can reassess your need for medications and monitor your lab values. If you do not have  a primary care physician, you can call [phone] for a physician referral.   Increase activity slowly   Complete by: As directed           Allergies as of 03/05/2022   No Known Allergies      Medication List     STOP taking these medications  calcitRIOL 0.25 MCG capsule Commonly known as: ROCALTROL   furosemide 20 MG tablet Commonly known as: LASIX   irbesartan 150 MG tablet Commonly known as: AVAPRO       TAKE these medications    acetaminophen 500 MG tablet Commonly known as: TYLENOL Take 500 mg by mouth 2 (two) times daily.   aspirin 81 MG tablet Take 81 mg by mouth every evening.   clopidogrel 75 MG tablet Commonly known as: PLAVIX Take 1 tablet by mouth once daily What changed: when to take this   COQ10 150 MG Caps Take 150 mg by mouth every evening.   Dulcolax 5 MG EC tablet Generic drug: bisacodyl 3 tablets as needed   famotidine 40 MG tablet Commonly known as: PEPCID Take 40 mg by mouth every evening. What changed: Another medication with the same name was removed. Continue taking this medication, and follow the directions you see here.   insulin NPH-regular Human (70-30) 100 UNIT/ML injection Inject 50 Units into the skin 2 (two) times daily with a meal. What changed:  how much to take additional instructions   levothyroxine 25 MCG tablet Commonly known as: SYNTHROID Take 25 mcg by mouth daily.   memantine 10 MG tablet Commonly known as: NAMENDA Take 1 tablet (10 mg total) by mouth 2 (two) times daily.   metoprolol succinate 25 MG 24 hr tablet Commonly known as: TOPROL-XL Take 1 tablet (25 mg total) by mouth daily. What changed: when to take this   nitroGLYCERIN 0.4 MG SL tablet Commonly known as: NITROSTAT Place 1 tablet (0.4 mg total) under the tongue every 5 (five) minutes as needed for chest pain.   OVER THE COUNTER MEDICATION Take 3 drops by mouth every evening. Cortexi drops   OVER THE COUNTER MEDICATION Take 1  tablet by mouth every evening. cocoavia   polyethylene glycol 17 g packet Commonly known as: MIRALAX / GLYCOLAX Take 17 g by mouth daily.   RENAL MULTIVITAMIN/ZINC PO Take 1 tablet by mouth every evening.   rosuvastatin 40 MG tablet Commonly known as: CRESTOR Take 1 tablet by mouth once daily What changed: when to take this          Follow-up Information     [name], MD Follow up in 1 week(s).   Specialty: Family Medicine Why: post hospitalization follow up Contact information: [place] [address] [place], [facility] Follow up.   Specialty: Home Health Services Contact information: [address] [place] [address] [phone]                 TOTAL DISCHARGE TIME: 57 minutes  [facility] Pager on [email or web address]  03/06/2022, 3:04 PM

## 2022-03-05 NOTE — Evaluation (Signed)
Clinical/Bedside Swallow Evaluation Patient Details  Name: LUCY WOOLEVER MRN: [id number] Date of Birth: [birth year]  Today's Date: 03/05/2022 Time: SLP Start Time (ACUTE ONLY): 4098 SLP Stop Time (ACUTE ONLY): 0910 SLP Time Calculation (min) (ACUTE ONLY): 15 min  Past Medical History:  Past Medical History:  Diagnosis Date   Abnormal Doppler ultrasound of carotid artery 12/16/2012   mildly abnormal doppler . no prior studies to compare to. follow up studies are recommended when clinically indicated.   Acute respiratory failure with hypoxia (HCC) 11/2018   Blepharitis    Cancer (HCC)    Carotid stenosis, left 11/2018   on left 60%   Diabetes mellitus without complication (HCC)    Exogenous obesity    Gastric ulcer    GI bleed    History of stress test 06/06/2012   low risk scan. fixed inferior defect consistant with prior infarction with moderatly reduced EF. no appreciable evidence of ischemia. no significant change from previous study   Hx of echocardiogram 08-15-2012   EF  45-50 % very difficult study, severe LVH, pacemaker, trace TR,mildly enlarged RV,; aorta mildly calcified   Pacemaker 2011   [employer] Accent DDDR ; this is the third generator this pt. has had   Shingles    TIA (transient ischemic attack) 11/2018   vs stroke not seen on CT   UTI (lower urinary tract infection)    Past Surgical History:  Past Surgical History:  Procedure Laterality Date   CARDIAC CATHETERIZATION  12-29-2003   rt. coronary angiography was done with a 6 french 4 cm taper coronary cath. This demonstrated  approximately 70 to 75% mildly segmental stenosis in the mid RCA before the acute margin           CARDIAC CATHETERIZATION  11-19-2003   diagnostic cath,, pt. to get second cath to have stent placed   cardiac stents     CATARACT EXTRACTION     KNEE SURGERY Right    PPM GENERATOR CHANGEOUT N/A 04/07/2019   Procedure: PPM GENERATOR CHANGEOUT;  Surgeon: Sanda Klein, MD;  Location: [facility];  Service: Cardiovascular;  Laterality: N/A;   PROSTATE BIOPSY     prostate seed implants     SPINE SURGERY     WISDOM TOOTH EXTRACTION     HPI:  Patient is an 88 y.o. male with PMH: CAD s/p stent, TIA 2020, OSA, DJD, HTN, HLD, DM-2, mild dementia, CKD stage 4, CHF, s/p pacemaker, chronic anxiety/depression. He initially presented to [facility] ED after fall at home and lack of coordination. Upon arrival to ED, symptoms had resolved. Neurology consulted and recommended TIA work-up. CT head negative for acute intracranial abnormality. MRI pending if compatible with his pacemaker.    Assessment / Plan / Recommendation  Clinical Impression  Patient is not currently presenting with any clinical s/s of dysphagia as per this swallow evaluation completed at bedside. Patient was sitting up in recliner when SLP arrived and was starting to eat his breakfast. Mastication of regular solids was mildly prolonged but this observed while patient talking to SLP while eating. Swallow initiation was timely and no overt s/s aspiration or penetration. Patient denies any current or past dysphagia. Suspect cognition at or near baseline as CT head negative and patient with baseline mild dementia. No f/u recommended for dysphagia management. SLP Visit Diagnosis: Dysphagia, unspecified (R13.10)    Aspiration Risk  No limitations    Diet Recommendation Regular;Thin liquid   Liquid Administration via: Cup;Straw Medication Administration:  Whole meds with liquid Supervision: Patient able to self feed Postural Changes: Seated upright at 90 degrees    Other  Recommendations Oral Care Recommendations: Oral care BID    Recommendations for follow up therapy are one component of a multi-disciplinary discharge planning process, led by the attending physician.  Recommendations may be updated based on patient status, additional functional criteria and insurance authorization.  Follow up Recommendations No SLP follow up       Assistance Recommended at Discharge Set up Supervision/Assistance  Functional Status Assessment Patient has not had a recent decline in their functional status  Frequency and Duration     N/A       Prognosis   N/A     Swallow Study   General Date of Onset: 03/04/22 HPI: Patient is an 88 y.o. male with PMH: CAD s/p stent, TIA 2020, OSA, DJD, HTN, HLD, DM-2, mild dementia, CKD stage 4, CHF, s/p pacemaker, chronic anxiety/depression. He initially presented to [facility] ED after fall at home and lack of coordination. Upon arrival to ED, symptoms had resolved. Neurology consulted and recommended TIA work-up. CT head negative for acute intracranial abnormality. MRI pending if compatible with his pacemaker. Type of Study: Bedside Swallow Evaluation Previous Swallow Assessment: none found Diet Prior to this Study: Regular;Thin liquids Temperature Spikes Noted: No Respiratory Status: Room air History of Recent Intubation: No Behavior/Cognition: Alert;Cooperative;Pleasant mood Oral Cavity Assessment: Within Functional Limits Oral Care Completed by SLP: No Oral Cavity - Dentition: Adequate natural dentition Vision: Functional for self-feeding Self-Feeding Abilities: Able to feed self Patient Positioning: Upright in bed Baseline Vocal Quality: Normal Volitional Cough: Strong Volitional Swallow: Able to elicit    Oral/Motor/Sensory Function Overall Oral Motor/Sensory Function: Within functional limits   Ice Chips     Thin Liquid Thin Liquid: Within functional limits Presentation: Straw;Self Fed    Nectar Thick     Honey Thick     Puree Puree: Not tested   Solid     Solid: Within functional limits Presentation: [place], MA, CCC-SLP Speech Therapy

## 2022-03-05 NOTE — Evaluation (Signed)
Occupational Therapy Evaluation Patient Details Name: [name] MRN: [id number] DOB: Mar 25, 1932 Today's Date: 03/05/2022   History of Present Illness Pt is an 88 y.o male presenting to [facility] ED on 03/04/2022 after a fall striking the R side of his face. PMH includes ARF, CA, DM, pacemaker, TIA, CAD, CKD, HTN, OSA, DJD, mixed alzheimers and vascular dementia.   Clinical Impression   PTA, pt lived with his wife and son's family in a multi-family dwelling. Per pt, he was mod I in ADL and functional mobility. Upon eval, pt functioning grossly at min guard level for standing ADL. Performing LB ADL with min guard A and UB ADL with set-up. Pt with decreased peripheral vision in R eye in superior and inferior visual fields due to swelling around orbital area affecting skill to locate needed grooming items. Pt reports he sponge bathes most often due to fear of falling in the shower. Recommending HHOT to address safety and independence in ADL in the home setting.      Recommendations for follow up therapy are one component of a multi-disciplinary discharge planning process, led by the attending physician.  Recommendations may be updated based on patient status, additional functional criteria and insurance authorization.   Follow Up Recommendations  Home health OT    Assistance Recommended at Discharge Intermittent Supervision/Assistance  Patient can return home with the following A little help with walking and/or transfers;A little help with bathing/dressing/bathroom;Assistance with cooking/housework;Direct supervision/assist for medications management;Direct supervision/assist for financial management;Assist for transportation    Functional Status Assessment  Patient has had a recent decline in their functional status and demonstrates the ability to make significant improvements in function in a reasonable and predictable amount of time.  Equipment Recommendations  None recommended by OT (Pt has  all recommended equipment)    Recommendations for Other Services       Precautions / Restrictions Precautions Precautions: Fall Restrictions Weight Bearing Restrictions: No      Mobility Bed Mobility Overal bed mobility: Needs Assistance Bed Mobility: Supine to Sit     Supine to sit: Min assist     General bed mobility comments: Pt initially reporting does not need help and using momentum to elevate trunk, but ultimately min A for hand held assist to elevate trunk.    Transfers Overall transfer level: Needs assistance Equipment used: Rolling walker (2 wheels) Transfers: Sit to/from Stand Sit to Stand: Min guard           General transfer comment: cues for safety after standing to achieve balance before moving.      Balance Overall balance assessment: Needs assistance Sitting-balance support: No upper extremity supported, Feet supported Sitting balance-Leahy Scale: Good Sitting balance - Comments: Able to reach to feet sitting EOB   Standing balance support: Bilateral upper extremity supported, No upper extremity supported, During functional activity Standing balance-Leahy Scale: Fair Standing balance comment: min guard A for safety with RW and during bil static standing tasks                           ADL either performed or assessed with clinical judgement   ADL Overall ADL's : Needs assistance/impaired Eating/Feeding: Set up;Sitting   Grooming: Oral care;Min guard;Standing Grooming Details (indicate cue type and reason): Increased time to locate grooming items Upper Body Bathing: Set up;Sitting   Lower Body Bathing: Set up;Sitting/lateral leans   Upper Body Dressing : Sitting;Set up   Lower Body Dressing: Min guard;Sit to/from  stand   Toilet Transfer: Min guard;Ambulation;Rolling walker (2 wheels);Comfort height toilet Toilet Transfer Details (indicate cue type and reason): simulated in room         Functional mobility during ADLs: Min  guard;Rolling walker (2 wheels) General ADL Comments: limited by decreased activity tolerance and vision in R eye     Vision Baseline Vision/History: 0 No visual deficits Ability to See in Adequate Light: 0 Adequate Patient Visual Report: Other (comment) (Pt reports decreased superior and inferior vision in R eye due to swelling.) Vision Assessment?: Yes Eye Alignment: Within Functional Limits Ocular Range of Motion: Within Functional Limits Alignment/Gaze Preference: Within Defined Limits Visual Fields: Other (comment) (Pt with intact peripheral vision L eye. Pt with intact temporal and nasal peripheral vision R eye, however, ~45 degrees superior from midline and ~20 degrees inferior from midline due to swelling of R eye.) Additional Comments: denies blurriness.     Perception     Praxis      Pertinent Vitals/Pain Pain Assessment Pain Assessment: Faces Faces Pain Scale: No hurt Pain Intervention(s): Monitored during session     Hand Dominance Right (Reports greater use over time of LUE due to RUE significant arthritis)   Extremity/Trunk Assessment Upper Extremity Assessment Upper Extremity Assessment: RUE deficits/detail;Generalized weakness RUE Deficits / Details: decreased shoulder flexion ~70 degrees   Lower Extremity Assessment Lower Extremity Assessment: Defer to PT evaluation   Cervical / Trunk Assessment Cervical / Trunk Assessment: Kyphotic   Communication Communication Communication: HOH   Cognition Arousal/Alertness: Awake/alert Behavior During Therapy: WFL for tasks assessed/performed Overall Cognitive Status: No family/caregiver present to determine baseline cognitive functioning                                 General Comments: Pt with decresaed STM, observed to tell same stories abouyt [place] multiple times during 30 min session. Slightly impulsive. Pt with fair safety awareness, but forgetting RW on way back from sink, and reporting to OT  that his family does not want him to drive but sometimes he does anyway. Utilizing compensatory techniques typically reinforced for safety (push up from surface with at least one hand) when rising from EOB. 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     General Comments  Orthostatics: supine 165/93(107); seated: 156/100(116); standing: 178/132 (146). Returned to seated and afert `3 min 142/89 (103) RN notified    Exercises     Shoulder Instructions      Home Living Family/patient expects to be discharged to:: Private residence Living Arrangements: Spouse/significant other;Children Available Help at Discharge: Family;Available 24 hours/day Type of Home: House Home Access: Level entry     Home Layout: Multi-level;Able to live on main level with bedroom/bathroom Alternate Level Stairs-Number of Steps: 16 Alternate Level Stairs-Rails: Can reach both Bathroom Shower/Tub: Occupational psychologist: Standard Bathroom Accessibility: Yes How Accessible: Accessible via walker Home Equipment: [place] - single point;Rollator (4 wheels);Shower seat;Grab bars - tub/shower;Grab bars - toilet          Prior Functioning/Environment Prior Level of Function : Independent/Modified Independent             Mobility Comments: rollator ADLs Comments: Independent but does not shower often; usually sponge bathes        OT Problem List: Decreased strength;Decreased activity tolerance;Decreased range of motion;Impaired balance (sitting and/or standing);Impaired vision/perception;Decreased cognition;Decreased safety awareness;Decreased  knowledge of use of DME or AE      OT Treatment/Interventions: Self-care/ADL training;Therapeutic exercise;Energy conservation;DME and/or AE instruction;Therapeutic activities;Cognitive remediation/compensation;Visual/perceptual remediation/compensation;Patient/family education;Balance training     OT Goals(Current goals can be found in the care plan section) Acute Rehab OT Goals Patient Stated Goal: see better OT Goal Formulation: With patient Time For Goal Achievement: 03/19/22 Potential to Achieve Goals: Good  OT Frequency: Min 2X/week    Co-evaluation              AM-PAC OT "6 Clicks" Daily Activity     Outcome Measure Help from another person eating meals?: None Help from another person taking care of personal grooming?: A Little Help from another person toileting, which includes using toliet, bedpan, or urinal?: A Little Help from another person bathing (including washing, rinsing, drying)?: A Little Help from another person to put on and taking off regular upper body clothing?: A Little Help from another person to put on and taking off regular lower body clothing?: A Little 6 Click Score: 19   End of Session Equipment Utilized During Treatment: Gait belt;Rolling walker (2 wheels) Nurse Communication: Mobility status (elevated BP)  Activity Tolerance: Patient tolerated treatment well Patient left: in chair;with call bell/phone within reach;with chair alarm set;with nursing/sitter in room  OT Visit Diagnosis: Unsteadiness on feet (R26.81);Muscle weakness (generalized) (M62.81);Other abnormalities of gait and mobility (R26.89);History of falling (Z91.81);Other symptoms and signs involving cognitive function                Time: 6283-6629 OT Time Calculation (min): 31 min Charges:  OT General Charges $OT Visit: 1 Visit OT Evaluation $OT Eval Moderate Complexity: 1 Mod OT Treatments $Self Care/Home Management : 8-22 mins  Shanda Howells, OTR/L [facility] Office: [phone]   [name] 03/05/2022, 9:11 AM

## 2022-03-05 NOTE — H&P (Signed)
History and Physical  [name] [id number] DOB: 09-May-1932 DOA: 03/04/2022  Referring physician: Accepted by [facility], Dr. Candiss Norse.  PCP: [name], MD  Outpatient Specialists: neurology, cardiology Patient coming from: Home  Chief Complaint: Fall   HPI: [name] is a 88 y.o. male with medical history significant for coronary artery disease status post stent placement on DAPT, TIA in 2020, OSA, DJD, hypertension, hyperlipidemia, DM 2, CKD 4, chronic combined systolic and diastolic CHF, sick sinus syndrome/complete heart block status post pacemaker placement, chronic anxiety/depression, hypothyroidism, mild dementia, who initially presented to [facility] ED after a fall and lack of coordination.  The patient reports that he was making coffee and then was having some difficulty closing the coffee lid which had not happened in the past.  Unclear of last known well.  Upon arrival to the ED his symptoms had resolved.  EDP discussed the case with neurology who recommended admission for TIA work-up.  The patient was admitted by [facility], hospitalist service.  At time of visit, the patient is back to his baseline.  ED Course: Tmax 98.2.  BP 139/77, pulse 81, respiratory 18, O2 saturation 96% on room air.  Lab studies remarkable for serum glucose 242, BUN 35, creatinine 2.1, GFR 29.  CBC essentially unremarkable.  UA negative for pyuria.  Review of Systems: Review of systems as noted in the HPI. All other systems reviewed and are negative.   Past Medical History:  Diagnosis Date   Abnormal Doppler ultrasound of carotid artery 12/16/2012   mildly abnormal doppler . no prior studies to compare to. follow up studies are recommended when clinically indicated.   Acute respiratory failure with hypoxia (HCC) 11/2018   Blepharitis    Cancer (HCC)    Carotid stenosis, left 11/2018   on left 60%   Diabetes mellitus without complication (HCC)    Exogenous obesity    Gastric ulcer    GI bleed     History of stress test 06/06/2012   low risk scan. fixed inferior defect consistant with prior infarction with moderatly reduced EF. no appreciable evidence of ischemia. no significant change from previous study   Hx of echocardiogram 08-15-2012   EF  45-50 % very difficult study, severe LVH, pacemaker, trace TR,mildly enlarged RV,; aorta mildly calcified   Pacemaker 2011   [employer] Accent DDDR ; this is the third generator this pt. has had   Shingles    TIA (transient ischemic attack) 11/2018   vs stroke not seen on CT   UTI (lower urinary tract infection)    Past Surgical History:  Procedure Laterality Date   CARDIAC CATHETERIZATION  12-29-2003   rt. coronary angiography was done with a 6 french 4 cm taper coronary cath. This demonstrated  approximately 70 to 75% mildly segmental stenosis in the mid RCA before the acute margin           CARDIAC CATHETERIZATION  11-19-2003   diagnostic cath,, pt. to get second cath to have stent placed   cardiac stents     CATARACT EXTRACTION     KNEE SURGERY Right    PPM GENERATOR CHANGEOUT N/A 04/07/2019   Procedure: PPM GENERATOR CHANGEOUT;  Surgeon: Sanda Klein, MD;  Location: [place] [facility];  Service: Cardiovascular;  Laterality: N/A;   PROSTATE BIOPSY     prostate seed implants     SPINE SURGERY     WISDOM TOOTH EXTRACTION      Social History:  reports that he has never  smoked. He has quit using smokeless tobacco. He reports current alcohol use. He reports that he does not use drugs.   No Known Allergies  Family History  Problem Relation Age of Onset   Heart disease Father       Prior to Admission medications   Medication Sig Start Date End Date Taking? Authorizing Provider  acetaminophen (TYLENOL) 500 MG tablet Take 500 mg by mouth 2 (two) times daily.    [provider]  aspirin 81 MG tablet Take 81 mg by mouth every evening.     [provider]  BD VEO INSULIN SYRINGE U/F 31G X 15/64" 1 ML MISC USE TO  [place] DAILY 04/01/19   [provider]  bisacodyl (DULCOLAX) 5 MG EC tablet 3 tablets as needed    [provider]  calcitRIOL (ROCALTROL) 0.25 MCG capsule Take 0.25 mcg by mouth every Monday, Wednesday, and Friday. evening    [provider]  clopidogrel (PLAVIX) 75 MG tablet Take 1 tablet by mouth once daily 01/03/22   Croitoru, Mihai, MD  Coenzyme Q10 (CO Q 10) 100 MG CAPS Take 100 mg by mouth daily in the afternoon.    [provider]  Continuous Blood Gluc Sensor (FREESTYLE LIBRE 3 SENSOR) MISC ON INSULIN TWICE A DAY. CHANGE SENSOR EVERY 14 DAYS 09/23/21   [provider]  Continuous Blood Gluc Sensor (FREESTYLE LIBRE 3 SENSOR) MISC  06/16/21   [provider]  Emollient (UDDERLY SMOOTH EX) Apply 1 application. topically daily. Apply to both feet    [provider]  famotidine (PEPCID) 20 MG tablet Take 1 tablet (20 mg total) by mouth daily. 05/02/21   Mercy Riding, MD  famotidine (PEPCID) 40 MG tablet Take 40 mg by mouth at bedtime.    [provider]  furosemide (LASIX) 20 MG tablet Take 1 tablet (20 mg total) by mouth daily as needed for edema. 10/03/21   Croitoru, Mihai, MD  insulin NPH-regular Human (70-30) 100 UNIT/ML injection Inject 50 Units into the skin 2 (two) times daily with a meal. 05/02/21   Gonfa, Charlesetta Ivory, MD  irbesartan (AVAPRO) 150 MG tablet Take 1 tablet (150 mg total) by mouth daily. Patient taking differently: Take 100 mg by mouth daily. 05/06/21   Mercy Riding, MD  levothyroxine (SYNTHROID, LEVOTHROID) 25 MCG tablet Take 25 mcg by mouth daily.  02/09/14   [provider]  memantine (NAMENDA) 10 MG tablet Take 1 tablet (10 mg total) by mouth 2 (two) times daily. 07/21/21   Rondel Jumbo, PA-C  metoprolol succinate (TOPROL-XL) 25 MG 24 hr tablet Take 1 tablet (25 mg total) by mouth daily. 10/04/21   Croitoru, Mihai, MD  Multiple Vitamin (RENAL MULTIVITAMIN/ZINC PO)      [provider]  nitroGLYCERIN (NITROSTAT) 0.4 MG SL tablet Place 1 tablet (0.4 mg total) under the tongue every 5 (five) minutes as needed for chest pain. 12/27/18   Croitoru, Dani Gobble, MD  [facility] ULTRA test strip  05/10/19   [provider]  OVER THE COUNTER MEDICATION Take 1 capsule by mouth every evening. OTC vitamin for patients with kidney issues    [provider]  polyethylene glycol (MIRALAX / GLYCOLAX) 17 g packet Take 17 g by mouth daily. 08/25/21   Charlesetta Shanks, MD  rosuvastatin (CRESTOR) 40 MG tablet Take 1 tablet by mouth once daily 01/03/22   Croitoru, Mihai, MD    Physical Exam: BP (!) 159/77 (BP Location: Left Arm)  Pulse 81   Temp (!) 97.5 F (36.4 C) (Oral)   Resp 18   Ht '5\' 8"'$  (1.727 m)   Wt 105 kg   SpO2 96%   BMI 35.20 kg/m   General: 88 y.o. year-old male well developed well nourished in no acute distress.  Alert and oriented x3. Cardiovascular: Regular rate and rhythm with no rubs or gallops.  No thyromegaly or JVD noted.  No lower extremity edema. 2/4 pulses in all 4 extremities. Respiratory: Clear to auscultation with no wheezes or rales. Good inspiratory effort. Abdomen: Soft nontender nondistended with normal bowel sounds x4 quadrants. Muskuloskeletal: No cyanosis, clubbing or edema noted bilaterally Neuro: CN II-XII intact, strength, sensation, reflexes Skin: No ulcerative lesions noted or rashes Psychiatry: Judgement and insight appear normal. Mood is appropriate for condition and setting          Labs on Admission:  Basic Metabolic Panel: Recent Labs  Lab 03/04/22 1302  NA 138  K 4.6  CL 101  CO2 26  GLUCOSE 242*  BUN 35*  CREATININE 2.12*  CALCIUM 9.6   Liver Function Tests: Recent Labs  Lab 03/04/22 1302  AST 24  ALT 20  ALKPHOS 55  BILITOT 0.6  PROT 7.5  ALBUMIN 4.0   No results for input(s): "LIPASE", "AMYLASE" in the last 168 hours. No results for input(s): "AMMONIA" in the last 168  hours. CBC: Recent Labs  Lab 03/04/22 1302  WBC 7.8  NEUTROABS 5.4  HGB 15.8  HCT 48.3  MCV 91.8  PLT 156   Cardiac Enzymes: No results for input(s): "CKTOTAL", "CKMB", "CKMBINDEX", "TROPONINI" in the last 168 hours.  BNP (last 3 results) Recent Labs    05/02/21 0004  BNP 55.4    ProBNP (last 3 results) No results for input(s): "PROBNP" in the last 8760 hours.  CBG: Recent Labs  Lab 03/04/22 2146  GLUCAP 167*    Radiological Exams on Admission: CT Head Wo Contrast  Result Date: 03/04/2022 CLINICAL DATA:  Fall EXAM: CT HEAD WITHOUT CONTRAST CT MAXILLOFACIAL WITHOUT CONTRAST CT CERVICAL SPINE WITHOUT CONTRAST TECHNIQUE: Multidetector CT imaging of the head, cervical spine, and maxillofacial structures were performed using the standard protocol without intravenous contrast. Multiplanar CT image reconstructions of the cervical spine and maxillofacial structures were also generated. RADIATION DOSE REDUCTION: This exam was performed according to the departmental dose-optimization program which includes automated exposure control, adjustment of the mA and/or kV according to patient size and/or use of iterative reconstruction technique. COMPARISON:  05/10/2021 CT head; no prior CT cervical spine or maxillofacial FINDINGS: CT HEAD FINDINGS Brain: No evidence of acute infarct, hemorrhage, mass, mass effect, or midline shift. No hydrocephalus or extra-axial fluid collection. Periventricular white matter changes, likely the sequela of chronic small vessel ischemic disease. Age related cerebral atrophy. Basal ganglia calcifications. Vascular: No hyperdense vessel. Skull: Normal. Negative for fracture or focal lesion. CT MAXILLOFACIAL FINDINGS Osseous: No fracture or mandibular dislocation. No destructive process. Orbits: Negative. No traumatic or inflammatory finding. Status post bilateral lens replacements. Sinuses: Mild mucosal thickening in the right maxillary sinus, with osseous  thickening, likely chronic right maxillary sinusitis. Paranasal sinuses otherwise clear. The mastoids are well aerated. Soft tissues: Right periorbital and premalar hematoma, which measures up to 2.2 x 1.9 x 3.2 cm (AP x TR x CC). CT CERVICAL SPINE FINDINGS Alignment: Straightening and mild reversal of the normal cervical lordosis. No listhesis. Skull base and vertebrae: No acute fracture. No suspicious osseous lesion. Diffuse osseous bridging throughout the cervical spine.  Soft tissues and spinal canal: No prevertebral fluid or swelling. No visible canal hematoma. Disc levels:  No high-grade spinal canal stenosis. Upper chest: Negative. Other: Small hypoenhancing nodules in the thyroid (< 1.5 cm), for which no follow-up is indicated. (Reference: J Am Coll Radiol. 2015 Feb;12(2): 143-50) IMPRESSION: 1. No acute intracranial process. 2. Right periorbital and premalar hematoma. No acute facial bone fracture. 3. No acute fracture or traumatic listhesis in the cervical spine. Diffuse osseous bridging is noted throughout the cervical spine, possibly diffuse idiopathic skeletal hyperostosis. Electronically Signed   By: Merilyn Baba M.D.   On: 03/04/2022 13:42   CT Cervical Spine Wo Contrast  Result Date: 03/04/2022 CLINICAL DATA:  Fall EXAM: CT HEAD WITHOUT CONTRAST CT MAXILLOFACIAL WITHOUT CONTRAST CT CERVICAL SPINE WITHOUT CONTRAST TECHNIQUE: Multidetector CT imaging of the head, cervical spine, and maxillofacial structures were performed using the standard protocol without intravenous contrast. Multiplanar CT image reconstructions of the cervical spine and maxillofacial structures were also generated. RADIATION DOSE REDUCTION: This exam was performed according to the departmental dose-optimization program which includes automated exposure control, adjustment of the mA and/or kV according to patient size and/or use of iterative reconstruction technique. COMPARISON:  05/10/2021 CT head; no prior CT cervical spine  or maxillofacial FINDINGS: CT HEAD FINDINGS Brain: No evidence of acute infarct, hemorrhage, mass, mass effect, or midline shift. No hydrocephalus or extra-axial fluid collection. Periventricular white matter changes, likely the sequela of chronic small vessel ischemic disease. Age related cerebral atrophy. Basal ganglia calcifications. Vascular: No hyperdense vessel. Skull: Normal. Negative for fracture or focal lesion. CT MAXILLOFACIAL FINDINGS Osseous: No fracture or mandibular dislocation. No destructive process. Orbits: Negative. No traumatic or inflammatory finding. Status post bilateral lens replacements. Sinuses: Mild mucosal thickening in the right maxillary sinus, with osseous thickening, likely chronic right maxillary sinusitis. Paranasal sinuses otherwise clear. The mastoids are well aerated. Soft tissues: Right periorbital and premalar hematoma, which measures up to 2.2 x 1.9 x 3.2 cm (AP x TR x CC). CT CERVICAL SPINE FINDINGS Alignment: Straightening and mild reversal of the normal cervical lordosis. No listhesis. Skull base and vertebrae: No acute fracture. No suspicious osseous lesion. Diffuse osseous bridging throughout the cervical spine. Soft tissues and spinal canal: No prevertebral fluid or swelling. No visible canal hematoma. Disc levels:  No high-grade spinal canal stenosis. Upper chest: Negative. Other: Small hypoenhancing nodules in the thyroid (< 1.5 cm), for which no follow-up is indicated. (Reference: J Am Coll Radiol. 2015 Feb;12(2): 143-50) IMPRESSION: 1. No acute intracranial process. 2. Right periorbital and premalar hematoma. No acute facial bone fracture. 3. No acute fracture or traumatic listhesis in the cervical spine. Diffuse osseous bridging is noted throughout the cervical spine, possibly diffuse idiopathic skeletal hyperostosis. Electronically Signed   By: Merilyn Baba M.D.   On: 03/04/2022 13:42   CT Maxillofacial Wo Contrast  Result Date: 03/04/2022 CLINICAL DATA:   Fall EXAM: CT HEAD WITHOUT CONTRAST CT MAXILLOFACIAL WITHOUT CONTRAST CT CERVICAL SPINE WITHOUT CONTRAST TECHNIQUE: Multidetector CT imaging of the head, cervical spine, and maxillofacial structures were performed using the standard protocol without intravenous contrast. Multiplanar CT image reconstructions of the cervical spine and maxillofacial structures were also generated. RADIATION DOSE REDUCTION: This exam was performed according to the departmental dose-optimization program which includes automated exposure control, adjustment of the mA and/or kV according to patient size and/or use of iterative reconstruction technique. COMPARISON:  05/10/2021 CT head; no prior CT cervical spine or maxillofacial FINDINGS: CT HEAD FINDINGS Brain: No evidence of  acute infarct, hemorrhage, mass, mass effect, or midline shift. No hydrocephalus or extra-axial fluid collection. Periventricular white matter changes, likely the sequela of chronic small vessel ischemic disease. Age related cerebral atrophy. Basal ganglia calcifications. Vascular: No hyperdense vessel. Skull: Normal. Negative for fracture or focal lesion. CT MAXILLOFACIAL FINDINGS Osseous: No fracture or mandibular dislocation. No destructive process. Orbits: Negative. No traumatic or inflammatory finding. Status post bilateral lens replacements. Sinuses: Mild mucosal thickening in the right maxillary sinus, with osseous thickening, likely chronic right maxillary sinusitis. Paranasal sinuses otherwise clear. The mastoids are well aerated. Soft tissues: Right periorbital and premalar hematoma, which measures up to 2.2 x 1.9 x 3.2 cm (AP x TR x CC). CT CERVICAL SPINE FINDINGS Alignment: Straightening and mild reversal of the normal cervical lordosis. No listhesis. Skull base and vertebrae: No acute fracture. No suspicious osseous lesion. Diffuse osseous bridging throughout the cervical spine. Soft tissues and spinal canal: No prevertebral fluid or swelling. No visible  canal hematoma. Disc levels:  No high-grade spinal canal stenosis. Upper chest: Negative. Other: Small hypoenhancing nodules in the thyroid (< 1.5 cm), for which no follow-up is indicated. (Reference: J Am Coll Radiol. 2015 Feb;12(2): 143-50) IMPRESSION: 1. No acute intracranial process. 2. Right periorbital and premalar hematoma. No acute facial bone fracture. 3. No acute fracture or traumatic listhesis in the cervical spine. Diffuse osseous bridging is noted throughout the cervical spine, possibly diffuse idiopathic skeletal hyperostosis. Electronically Signed   By: Merilyn Baba M.D.   On: 03/04/2022 13:42   DG Chest 2 View  Result Date: 03/04/2022 CLINICAL DATA:  Possible syncope.  Fall. EXAM: CHEST - 2 VIEW COMPARISON:  10/01/2021 FINDINGS: 1319 hours. Low volume film. The cardio pericardial silhouette is enlarged. The lungs are clear without focal pneumonia, edema, pneumothorax or pleural effusion. Right permanent pacemaker noted. Telemetry leads overlie the chest. IMPRESSION: Low volume film without acute cardiopulmonary findings. Electronically Signed   By: Misty Stanley M.D.   On: 03/04/2022 13:34    EKG: I independently viewed the EKG done and my findings are as followed: Atrial sensed ventricular paced rhythm rate of 78.  Nonspecific ST-T changes.  QTc 518.  Assessment/Plan Present on Admission:  TIA (transient ischemic attack)  Principal Problem:   TIA (transient ischemic attack)  Possible TIA Symptoms appear to have resolved. MRI if compatible with the patient pacemaker. Manage for TIA work-up Past lipid panel, hemoglobin A1c 2D echo with bubble study Frequent neuro checks Carotid Doppler ultrasound PT/OT/speech therapist evaluation.  Coronary artery disease status post stent placement  On home DAPT, resume if okay with cardiology Denies any chest pain at the time of this visit.  Type 2 diabetes with hyperglycemia Obtain hemoglobin A1c Start insulin sliding  scale.  GERD Resume home regimen.  Hypothyroidism Resume home levothyroxine.  Mild dementia Resume home regimen.  Hyperlipidemia Resume home Crestor.  Hypertension Ongoing permissive hypertension until CVA is ruled out. Monitor vital signs.  CKD 4 At his baseline creatinine Avoid nephrotoxic agents Monitor urine output   DVT prophylaxis: Subcu Lovenox daily  Code Status: Full code  Family Communication: Updated his son at bedside.  Disposition Plan: Admitted to telemetry medical unit as observation status by Dr. Candiss Norse.  Consults called: Neurology/stroke team.  Admission status: Observation status.   Status is: Observation    Kayleen Memos MD [facility] Pager [phone]  If 7PM-7AM, please contact night-coverage [email or web address] Password [email or web address]  03/05/2022, 12:07 AM

## 2022-03-05 NOTE — Progress Notes (Signed)
VASCULAR LAB    Carotid duplex has been performed.  See CV proc for preliminary results.   Kiannah Grunow, RVT 03/05/2022, 10:40 AM

## 2022-03-05 NOTE — TOC Transition Note (Signed)
Transition of Care [facility]) - CM/SW Discharge Note   Patient Details  Name: [name] MRN: [id number] Date of Birth: August 12, 1931  Transition of Care [facility] CM/SW Contact:  Carles Collet, RN Phone Number: 03/05/2022, 4:05 PM   Clinical Narrative:     Damaris Schooner w patient over the phone.  He is agreeable to [facility] services. Most recently active w [facility] and would like to use them again. Referral accepted. No other TOC needs identified for DC  Final next level of care: [place] Barriers to Discharge: No Barriers Identified   Patient Goals and CMS Choice        Discharge Placement                       Discharge Plan and Services                          HH Arranged: PT [place] Agency: [place] Date [place]: 03/05/22 Time [place]: [id number] Representative spoke with at [place]: Tommi Rumps  Social Determinants of Health ([place]) Interventions     Readmission Risk Interventions     No data to display

## 2022-03-06 ENCOUNTER — Telehealth: Payer: Self-pay | Admitting: Cardiovascular Disease

## 2022-03-06 NOTE — Telephone Encounter (Signed)
STAT if HR is under 50 or over 120 (normal HR is 60-100 beats per minute)  What is your heart rate? 50  Do you have a log of your heart rate readings (document readings)? 100 - Last night  Do you have any other symptoms? No  Pt's daughter in law states that pt was released from the hospital yesterday and has been experiencing HR's going up and down. She would like a callback regarding this matter

## 2022-03-06 NOTE — Telephone Encounter (Signed)
Attempted to contact patient to request manual transmission. No answer, unable to leave VM.

## 2022-03-06 NOTE — Telephone Encounter (Signed)
Spoke to patient's daughter n law Lattie Haw.She was calling to report patient fell this past Saturday.He slipped on a sock fell and hit his cheek.He went to ED.He was admitted over night.Stated all test were ok.He did not have a stroke.She wanted to get a PM download to make sure PM ok.His heart rate is 50 to 100.I will send message to device clinic.I will make Dr.Croitori aware.

## 2022-03-07 DIAGNOSIS — R410 Disorientation, unspecified: Secondary | ICD-10-CM | POA: Diagnosis not present

## 2022-03-07 DIAGNOSIS — I959 Hypotension, unspecified: Secondary | ICD-10-CM | POA: Diagnosis not present

## 2022-03-07 DIAGNOSIS — Z8673 Personal history of transient ischemic attack (TIA), and cerebral infarction without residual deficits: Secondary | ICD-10-CM | POA: Diagnosis not present

## 2022-03-07 DIAGNOSIS — Z23 Encounter for immunization: Secondary | ICD-10-CM | POA: Diagnosis not present

## 2022-03-07 DIAGNOSIS — K5909 Other constipation: Secondary | ICD-10-CM | POA: Diagnosis not present

## 2022-03-07 NOTE — Telephone Encounter (Signed)
Left message for Lattie Haw requesting Pt send a manual tranmission.  Advised to call device clinic if any questions on how to do that.

## 2022-03-08 DIAGNOSIS — Z794 Long term (current) use of insulin: Secondary | ICD-10-CM | POA: Diagnosis not present

## 2022-03-08 DIAGNOSIS — N184 Chronic kidney disease, stage 4 (severe): Secondary | ICD-10-CM | POA: Diagnosis not present

## 2022-03-08 DIAGNOSIS — Z87891 Personal history of nicotine dependence: Secondary | ICD-10-CM | POA: Diagnosis not present

## 2022-03-08 DIAGNOSIS — K219 Gastro-esophageal reflux disease without esophagitis: Secondary | ICD-10-CM | POA: Diagnosis not present

## 2022-03-08 DIAGNOSIS — I130 Hypertensive heart and chronic kidney disease with heart failure and stage 1 through stage 4 chronic kidney disease, or unspecified chronic kidney disease: Secondary | ICD-10-CM | POA: Diagnosis not present

## 2022-03-08 DIAGNOSIS — M1990 Unspecified osteoarthritis, unspecified site: Secondary | ICD-10-CM | POA: Diagnosis not present

## 2022-03-08 DIAGNOSIS — H01009 Unspecified blepharitis unspecified eye, unspecified eyelid: Secondary | ICD-10-CM | POA: Diagnosis not present

## 2022-03-08 DIAGNOSIS — E039 Hypothyroidism, unspecified: Secondary | ICD-10-CM | POA: Diagnosis not present

## 2022-03-08 DIAGNOSIS — Z955 Presence of coronary angioplasty implant and graft: Secondary | ICD-10-CM | POA: Diagnosis not present

## 2022-03-08 DIAGNOSIS — F32A Depression, unspecified: Secondary | ICD-10-CM | POA: Diagnosis not present

## 2022-03-08 DIAGNOSIS — I2510 Atherosclerotic heart disease of native coronary artery without angina pectoris: Secondary | ICD-10-CM | POA: Diagnosis not present

## 2022-03-08 DIAGNOSIS — F03A3 Unspecified dementia, mild, with mood disturbance: Secondary | ICD-10-CM | POA: Diagnosis not present

## 2022-03-08 DIAGNOSIS — E6609 Other obesity due to excess calories: Secondary | ICD-10-CM | POA: Diagnosis not present

## 2022-03-08 DIAGNOSIS — I5042 Chronic combined systolic (congestive) and diastolic (congestive) heart failure: Secondary | ICD-10-CM | POA: Diagnosis not present

## 2022-03-08 DIAGNOSIS — F03A4 Unspecified dementia, mild, with anxiety: Secondary | ICD-10-CM | POA: Diagnosis not present

## 2022-03-08 DIAGNOSIS — E1142 Type 2 diabetes mellitus with diabetic polyneuropathy: Secondary | ICD-10-CM | POA: Diagnosis not present

## 2022-03-08 DIAGNOSIS — I082 Rheumatic disorders of both aortic and tricuspid valves: Secondary | ICD-10-CM | POA: Diagnosis not present

## 2022-03-08 DIAGNOSIS — E1122 Type 2 diabetes mellitus with diabetic chronic kidney disease: Secondary | ICD-10-CM | POA: Diagnosis not present

## 2022-03-08 DIAGNOSIS — Z7902 Long term (current) use of antithrombotics/antiplatelets: Secondary | ICD-10-CM | POA: Diagnosis not present

## 2022-03-08 DIAGNOSIS — G4733 Obstructive sleep apnea (adult) (pediatric): Secondary | ICD-10-CM | POA: Diagnosis not present

## 2022-03-08 DIAGNOSIS — Z950 Presence of cardiac pacemaker: Secondary | ICD-10-CM | POA: Diagnosis not present

## 2022-03-08 DIAGNOSIS — Z7982 Long term (current) use of aspirin: Secondary | ICD-10-CM | POA: Diagnosis not present

## 2022-03-08 DIAGNOSIS — I442 Atrioventricular block, complete: Secondary | ICD-10-CM | POA: Diagnosis not present

## 2022-03-08 DIAGNOSIS — I495 Sick sinus syndrome: Secondary | ICD-10-CM | POA: Diagnosis not present

## 2022-03-08 DIAGNOSIS — Z6841 Body Mass Index (BMI) 40.0 and over, adult: Secondary | ICD-10-CM | POA: Diagnosis not present

## 2022-03-08 NOTE — Telephone Encounter (Signed)
Walked patient through sending manual transmission, transmission received normal device function, no episodes, 7 years left on device

## 2022-03-12 ENCOUNTER — Other Ambulatory Visit: Payer: Self-pay | Admitting: Cardiovascular Disease

## 2022-03-14 DIAGNOSIS — F03A3 Unspecified dementia, mild, with mood disturbance: Secondary | ICD-10-CM | POA: Diagnosis not present

## 2022-03-14 DIAGNOSIS — I130 Hypertensive heart and chronic kidney disease with heart failure and stage 1 through stage 4 chronic kidney disease, or unspecified chronic kidney disease: Secondary | ICD-10-CM | POA: Diagnosis not present

## 2022-03-14 DIAGNOSIS — I2510 Atherosclerotic heart disease of native coronary artery without angina pectoris: Secondary | ICD-10-CM | POA: Diagnosis not present

## 2022-03-14 DIAGNOSIS — N184 Chronic kidney disease, stage 4 (severe): Secondary | ICD-10-CM | POA: Diagnosis not present

## 2022-03-14 DIAGNOSIS — I5042 Chronic combined systolic (congestive) and diastolic (congestive) heart failure: Secondary | ICD-10-CM | POA: Diagnosis not present

## 2022-03-14 DIAGNOSIS — E1122 Type 2 diabetes mellitus with diabetic chronic kidney disease: Secondary | ICD-10-CM | POA: Diagnosis not present

## 2022-03-15 DIAGNOSIS — I130 Hypertensive heart and chronic kidney disease with heart failure and stage 1 through stage 4 chronic kidney disease, or unspecified chronic kidney disease: Secondary | ICD-10-CM | POA: Diagnosis not present

## 2022-03-15 DIAGNOSIS — I2510 Atherosclerotic heart disease of native coronary artery without angina pectoris: Secondary | ICD-10-CM | POA: Diagnosis not present

## 2022-03-15 DIAGNOSIS — E1122 Type 2 diabetes mellitus with diabetic chronic kidney disease: Secondary | ICD-10-CM | POA: Diagnosis not present

## 2022-03-15 DIAGNOSIS — I5042 Chronic combined systolic (congestive) and diastolic (congestive) heart failure: Secondary | ICD-10-CM | POA: Diagnosis not present

## 2022-03-15 DIAGNOSIS — F03A3 Unspecified dementia, mild, with mood disturbance: Secondary | ICD-10-CM | POA: Diagnosis not present

## 2022-03-15 DIAGNOSIS — N184 Chronic kidney disease, stage 4 (severe): Secondary | ICD-10-CM | POA: Diagnosis not present

## 2022-03-16 DIAGNOSIS — I2510 Atherosclerotic heart disease of native coronary artery without angina pectoris: Secondary | ICD-10-CM | POA: Diagnosis not present

## 2022-03-16 DIAGNOSIS — I130 Hypertensive heart and chronic kidney disease with heart failure and stage 1 through stage 4 chronic kidney disease, or unspecified chronic kidney disease: Secondary | ICD-10-CM | POA: Diagnosis not present

## 2022-03-16 DIAGNOSIS — F03A3 Unspecified dementia, mild, with mood disturbance: Secondary | ICD-10-CM | POA: Diagnosis not present

## 2022-03-16 DIAGNOSIS — E1122 Type 2 diabetes mellitus with diabetic chronic kidney disease: Secondary | ICD-10-CM | POA: Diagnosis not present

## 2022-03-16 DIAGNOSIS — N184 Chronic kidney disease, stage 4 (severe): Secondary | ICD-10-CM | POA: Diagnosis not present

## 2022-03-16 DIAGNOSIS — I5042 Chronic combined systolic (congestive) and diastolic (congestive) heart failure: Secondary | ICD-10-CM | POA: Diagnosis not present

## 2022-03-21 DIAGNOSIS — I5042 Chronic combined systolic (congestive) and diastolic (congestive) heart failure: Secondary | ICD-10-CM | POA: Diagnosis not present

## 2022-03-21 DIAGNOSIS — I2510 Atherosclerotic heart disease of native coronary artery without angina pectoris: Secondary | ICD-10-CM | POA: Diagnosis not present

## 2022-03-21 DIAGNOSIS — E1122 Type 2 diabetes mellitus with diabetic chronic kidney disease: Secondary | ICD-10-CM | POA: Diagnosis not present

## 2022-03-21 DIAGNOSIS — F03A3 Unspecified dementia, mild, with mood disturbance: Secondary | ICD-10-CM | POA: Diagnosis not present

## 2022-03-21 DIAGNOSIS — I130 Hypertensive heart and chronic kidney disease with heart failure and stage 1 through stage 4 chronic kidney disease, or unspecified chronic kidney disease: Secondary | ICD-10-CM | POA: Diagnosis not present

## 2022-03-21 DIAGNOSIS — N184 Chronic kidney disease, stage 4 (severe): Secondary | ICD-10-CM | POA: Diagnosis not present

## 2022-03-22 DIAGNOSIS — I130 Hypertensive heart and chronic kidney disease with heart failure and stage 1 through stage 4 chronic kidney disease, or unspecified chronic kidney disease: Secondary | ICD-10-CM | POA: Diagnosis not present

## 2022-03-22 DIAGNOSIS — E1122 Type 2 diabetes mellitus with diabetic chronic kidney disease: Secondary | ICD-10-CM | POA: Diagnosis not present

## 2022-03-22 DIAGNOSIS — N184 Chronic kidney disease, stage 4 (severe): Secondary | ICD-10-CM | POA: Diagnosis not present

## 2022-03-22 DIAGNOSIS — I5042 Chronic combined systolic (congestive) and diastolic (congestive) heart failure: Secondary | ICD-10-CM | POA: Diagnosis not present

## 2022-03-22 DIAGNOSIS — F03A3 Unspecified dementia, mild, with mood disturbance: Secondary | ICD-10-CM | POA: Diagnosis not present

## 2022-03-22 DIAGNOSIS — I2510 Atherosclerotic heart disease of native coronary artery without angina pectoris: Secondary | ICD-10-CM | POA: Diagnosis not present

## 2022-03-23 ENCOUNTER — Encounter: Payer: Medicare Other | Admitting: Psychology

## 2022-03-23 ENCOUNTER — Ambulatory Visit (INDEPENDENT_AMBULATORY_CARE_PROVIDER_SITE_OTHER): Payer: Medicare Other | Admitting: Psychology

## 2022-03-23 ENCOUNTER — Encounter: Payer: Self-pay | Admitting: Psychology

## 2022-03-23 ENCOUNTER — Ambulatory Visit: Payer: Medicare Other | Admitting: Psychology

## 2022-03-23 DIAGNOSIS — N184 Chronic kidney disease, stage 4 (severe): Secondary | ICD-10-CM | POA: Diagnosis not present

## 2022-03-23 DIAGNOSIS — E1142 Type 2 diabetes mellitus with diabetic polyneuropathy: Secondary | ICD-10-CM | POA: Diagnosis not present

## 2022-03-23 DIAGNOSIS — F01A0 Vascular dementia, mild, without behavioral disturbance, psychotic disturbance, mood disturbance, and anxiety: Secondary | ICD-10-CM

## 2022-03-23 DIAGNOSIS — I679 Cerebrovascular disease, unspecified: Secondary | ICD-10-CM

## 2022-03-23 DIAGNOSIS — E119 Type 2 diabetes mellitus without complications: Secondary | ICD-10-CM | POA: Insufficient documentation

## 2022-03-23 DIAGNOSIS — R4189 Other symptoms and signs involving cognitive functions and awareness: Secondary | ICD-10-CM

## 2022-03-23 DIAGNOSIS — I6930 Unspecified sequelae of cerebral infarction: Secondary | ICD-10-CM | POA: Diagnosis not present

## 2022-03-23 DIAGNOSIS — E039 Hypothyroidism, unspecified: Secondary | ICD-10-CM | POA: Diagnosis not present

## 2022-03-23 DIAGNOSIS — Z794 Long term (current) use of insulin: Secondary | ICD-10-CM | POA: Diagnosis not present

## 2022-03-23 HISTORY — DX: Vascular dementia, mild, without behavioral disturbance, psychotic disturbance, mood disturbance, and anxiety: F01.A0

## 2022-03-23 NOTE — Progress Notes (Signed)
   Psychometrician Note   Cognitive testing was administered to [name] by Milana Kidney, B.S. (psychometrist) under the supervision of Dr. Christia Reading, Ph.D., licensed psychologist on 03/23/2022. [name] did not appear overtly distressed by the testing session per behavioral observation or responses across self-report questionnaires. Rest breaks were offered.    The battery of tests administered was selected by Dr. Christia Reading, Ph.D. with consideration to [name] current level of functioning, the nature of his symptoms, emotional and behavioral responses during interview, level of literacy, observed level of motivation/effort, and the nature of the referral question. This battery was communicated to the psychometrist. Communication between Dr. Christia Reading, Ph.D. and the psychometrist was ongoing throughout the evaluation and Dr. Christia Reading, Ph.D. was immediately accessible at all times. Dr. Christia Reading, Ph.D. provided supervision to the psychometrist on the date of this service to the extent necessary to assure the quality of all services provided.    [name] will return within approximately 1-2 weeks for an interactive feedback session with Dr. [name] at which time his test performances, clinical impressions, and treatment recommendations will be reviewed in detail. [name] understands he can contact our office should he require our assistance before this time.  A total of 100 minutes of billable time were spent face-to-face with [name] by the psychometrist. This includes both test administration and scoring time. Billing for these services is reflected in the clinical report generated by Dr. Christia Reading, Ph.D.  This note reflects time spent with the psychometrician and does not include test scores or any clinical interpretations made by Dr. [name]. The full report will follow in a separate note.

## 2022-03-23 NOTE — Progress Notes (Signed)
NEUROPSYCHOLOGICAL EVALUATION [place]  Date of Evaluation: March 23, 2022  Reason for Referral:   [name] is a 88 y.o. right-handed Caucasian male referred by Sharene Butters, PA-C, to characterize his current cognitive functioning and assist with diagnostic clarity and treatment planning in the context of subjective cognitive decline and numerous cardiovascular comorbidities.   Assessment and Plan:   Clinical Impression(s): [name] pattern of performance is suggestive of an isolated impairment surrounding attention/concentration. Performance variability was exhibited across processing speed, semantic fluency, and confrontation naming. However, it should be highlighted that testing was completed in English rather than his native [place] and that this fact by itself could certainly account for weaker scores across expressive language tasks rather than neurological decline. Performances were otherwise adequate across other assessed domains when compared to age-matched peers. This includes cognitive flexibility, phonemic fluency, visuospatial abilities, and all aspects of learning and memory.   Regarding daily functioning, [name] does benefit from assistance with medication management and no longer drives due to cognitive dysfunction and family-held concerns surrounding his possible stroke history. His wife manages finances and bill paying, but this is longstanding in nature. He is likely on the border between mild and major neurocognitive disorder designations. However, given some evidence to suggest that cognitive dysfunction is impacting daily functioning, he likely best meets diagnostic criteria for a Major Neurocognitive Disorder ("dementia"). However, given test performances, he would certainly be towards the mild end of this spectrum at the present time.   Despite what can be found in previous medical records, memory  testing is certainly not suggestive of Alzheimer's disease as all aspects of memory testing were appropriate when compared to age-matched peers. Regarding etiology, the most likely cause for ongoing dysfunction appears to be a combination of cerebrovascular disease and other complex medical ailments. 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 Testing patterns align with the expected impact of these ongoing ailments and neuroimaging findings well. While he and his daughter-in-law reported a history of several "mini-strokes," there is no radiologic evidence to suggest any prior strokes (however, no MRIs have been performed to better examine this, potentially due to his pacemaker placement). The primary incident they describe which included temporary symptoms which corrected themselves after a relative short period of time aligns very well with a transient ischemic attack (TIA) rather than a small stroke. While TIAs are not believed to create permanent brain damage, they can be viewed as a red flag for increased stroke risk in the future. Overall, the most likely culprit for ongoing cognitive dysfunction appears to be vascular/medical in nature. Continued medical monitoring will be important moving forward.    Recommendations: [name] has already been prescribed a medication aimed to address memory loss and concerns surrounding cognitive decline (i.e., memantine/Namenda). He is encouraged to continue taking this medication as prescribed. It is important to highlight that this medication has been shown to slow functional decline in some individuals. There is no current treatment which can stop or reverse cognitive decline when caused by  a neurodegenerative illness.   Untreated sleep apnea will negatively impact memory and other cognitive abilities. It will also increase his risk for heart attack, stroke, and dementia. As such, he is strongly encouraged to utilize his CPAP  machine nightly or otherwise treat this condition directly.  Performance across neurocognitive testing is not a strong predictor of an individual's safety operating a motor vehicle. Should his family wish to pursue a formalized driving evaluation, they could reach out to the following agencies: [employer] in [place]: [phone] Driver Rehabilitative Services: [facility]: [place]: [phone] or [phone]  Should there be a progression of current deficits over time, he is unlikely to regain any independent living skills lost. Therefore, it is recommended that he remain as involved as possible in all aspects of household chores, finances, and medication management, with supervision to ensure adequate performance. He will likely benefit from the establishment and maintenance of a routine in order to maximize functional abilities over time.  It will be important for [name] to have another person with him when in situations where he may need to process information, weigh the pros and cons of different options, and make decisions, in order to ensure that he fully understands and recalls all information to be considered.  [name] is encouraged to attend to lifestyle factors for brain health (e.g., regular physical exercise, good nutrition habits, regular participation in cognitively-stimulating activities, and general stress management techniques), which are likely to have benefits for both emotional adjustment and cognition. Optimal control of vascular risk factors (including safe cardiovascular exercise and adherence to dietary recommendations) is encouraged. Continued participation in activities  which provide mental stimulation and social interaction is also recommended.   Memory can be improved using internal strategies such as rehearsal, repetition, chunking, mnemonics, association, and imagery. External strategies such as written notes in a consistently used memory journal, visual and nonverbal auditory cues such as a calendar on the refrigerator or appointments with alarm, such as on a cell phone, can also help maximize recall.    To address problems with processing speed, he may wish to consider:   -Ensuring that he is alerted when essential material or instructions are being presented   -Adjusting the speed at which new information is presented   -Allowing for more time in comprehending, processing, and responding in conversation  To address problems with fluctuating attention, he may wish to consider:   -Avoiding external distractions when needing to concentrate   -Limiting exposure to fast paced environments with multiple sensory demands   -Writing down complicated information and using checklists   -Attempting and completing one task at a time (i.e., no multi-tasking)   -Verbalizing aloud each step of a task to maintain focus   -Reducing the amount of information considered at one time  Review of Records:   [name] was seen by [facility] Ellouise Newer, M.D.) on 12/30/2018 in the context of a recently experienced transient ischemic attack (TIA). Briefly, on 11/24/2018, [name] described feeling strange. He turned on his computer and was unable to understand anything on it when the screen came up, with content reportedly appearing like hieroglyphics. He also could not understand the letters on the keyboard. He also recalled looking at a clock and seeing the clock hands appearing and disappearing. His family drove him to the ED. On the way, he was able to start recognizing and reading street signs. He denied headache or focal numbness/tingling/weakness. He has chronic  tinnitus and recalled his tinnitus being louder than usual. While in the ED, he underwent two head CTs, both of which were negative for any intracranial abnormalities. Head/neck CTA revealed 60% left ICA stenosis. Carotid dopplers showed only 40-59% left  ICA stenosis. Revascularization was not recommended at that time. Rather, aggressive risk factor modification and dual antiplatelet therapy was recommended with six monthly carotid ultrasound studies. Echo showed normal EF. LDL was 74 and HbA1c 7.1. He denied any further spells since that time. [name] denied any staring/unresponsive episodes and he denied any olfactory/gustatory hallucinations or myoclonic jerks. EEGs on 01/08/2019 and 02/05/2019 (24-hour) were normal.  He was seen by [facility] Sharene Butters, PA-C) on 07/21/2021 for an evaluation of memory loss. Memory decline was said to be present since December 2022 when he presented to the ED with a 4-day spell of confusion and altered mental status. Word finding difficulties were noted. He also described instances where he could not recall photographs which he had previously taken. He will repeat himself or ask repetitive questions and reported trouble recalling details of recent conversations. At that time, his family was assisting with medication and financial management. He stopped driving in 1017 due to concerns for possible stroke activity. Performance on a brief cognitive screening instrument (MOCA) was 20/30. Ultimately, [name] was referred for a comprehensive neuropsychological evaluation to characterize his cognitive abilities and to assist with diagnostic clarity and treatment planning.   He was most recently seen by Ms. Wertman for follow-up on 10/21/2021. He had been started on memantine in the interim and was reportedly doing well. Cognitive dysfunction was described as seeming stable overall.   Head CT on 11/24/2018 revealed cerebral atrophy and microvascular ischemic  disease of unspecified severity. Head CT on 05/02/2021 revealed atrophy with "extensive" microvascular ischemic disease. Head CT on 03/04/2022 was stable. No evidence to suggest a prior stroke or any acute intracranial abnormality was seen across any of the aforementioned scans.   Past Medical History:  Diagnosis Date   Abnormal Doppler ultrasound of carotid artery 12/16/2012   mildly abnormal doppler . no prior studies to compare to. follow up studies are recommended when clinically indicated.   Acute respiratory failure with hypoxia 11/2018   Blepharitis    CAD (coronary artery disease) 07/24/2013   Previously placed drug-eluting stents in the LAD and RCA in 2005, low risk perfusion study December 2011 in January 2014   Cardiomyopathy, ischemic 07/24/2013   EF 45-50% echo March 2014   Carotid stenosis, left 11/2018   on left 60%   CHB (complete heart block) 08/27/2019   Chronic combined systolic and diastolic heart failure 51/06/5850   CKD (chronic kidney disease) stage 3, GFR 30-59 ml/min 07/24/2013   Diabetic hypoglycemia 05/02/2021   Diabetic polyneuropathy 05/02/2021   Gastric ulcer    GI bleed    History of cancer    History of echocardiogram 08/15/2012   EF  45-50 % very difficult study, severe LVH, pacemaker, trace TR,mildly enlarged RV,; aorta mildly calcified   History of stress test 06/06/2012   low risk scan. fixed inferior defect consistant with prior infarction with moderatly reduced EF. no appreciable evidence of ischemia. no significant change from previous study   Hypothyroidism 11/24/2018   Long term (current) use of insulin 05/02/2021   Mixed hyperlipidemia 07/24/2013   Morbid obesity 02/18/2016   OSA (obstructive sleep apnea) 07/24/2013   no CPAP use   Pacemaker 2011   [employer] Accent DDDR ; this is the third generator this pt. has had   Pain due to onychomycosis of toenails of both feet 11/13/2018   Pain in joint of right shoulder 09/15/2021   Second degree  AV block 03/09/2014   Sensorineural hearing loss (SNHL)  of both ears 04/08/2018   Shingles    Sinus node dysfunction 03/09/2014   TIA (transient ischemic attack) 11/2018   vs stroke not seen on CT   Tinnitus, bilateral 04/08/2018   Type II diabetes mellitus    UTI (lower urinary tract infection)     Past Surgical History:  Procedure Laterality Date   CARDIAC CATHETERIZATION  12-29-2003   rt. coronary angiography was done with a 6 french 4 cm taper coronary cath. This demonstrated  approximately 70 to 75% mildly segmental stenosis in the mid RCA before the acute margin           CARDIAC CATHETERIZATION  11-19-2003   diagnostic cath,, pt. to get second cath to have stent placed   cardiac stents     CATARACT EXTRACTION     KNEE SURGERY Right    PPM GENERATOR CHANGEOUT N/A 04/07/2019   Procedure: PPM GENERATOR CHANGEOUT;  Surgeon: Sanda Klein, MD;  Location: [facility];  Service: Cardiovascular;  Laterality: N/A;   PROSTATE BIOPSY     prostate seed implants     SPINE SURGERY     WISDOM TOOTH EXTRACTION      Current Outpatient Medications:    acetaminophen (TYLENOL) 500 MG tablet, Take 500 mg by mouth 2 (two) times daily., Disp: , Rfl:    aspirin 81 MG tablet, Take 81 mg by mouth every evening. , Disp: , Rfl:    bisacodyl (DULCOLAX) 5 MG EC tablet, 3 tablets as needed, Disp: , Rfl:    clopidogrel (PLAVIX) 75 MG tablet, Take 1 tablet by mouth once daily (Patient taking differently: Take 75 mg by mouth every evening.), Disp: 90 tablet, Rfl: 0   Coenzyme Q10 (COQ10) 150 MG CAPS, Take 150 mg by mouth every evening., Disp: , Rfl:    famotidine (PEPCID) 40 MG tablet, Take 40 mg by mouth every evening., Disp: , Rfl:    insulin NPH-regular Human (70-30) 100 UNIT/ML injection, Inject 50 Units into the skin 2 (two) times daily with a meal. (Patient taking differently: Inject 30-50 Units into the skin 2 (two) times daily with a meal. Inject 50 units in the morning and 30 units at night),  Disp: 20 mL, Rfl: 1   levothyroxine (SYNTHROID, LEVOTHROID) 25 MCG tablet, Take 25 mcg by mouth daily. , Disp: , Rfl:    memantine (NAMENDA) 10 MG tablet, Take 1 tablet (10 mg total) by mouth 2 (two) times daily., Disp: 60 tablet, Rfl: 11   metoprolol succinate (TOPROL-XL) 25 MG 24 hr tablet, Take 1 tablet (25 mg total) by mouth daily. (Patient taking differently: Take 25 mg by mouth every evening.), Disp: 90 tablet, Rfl: 3   Multiple Vitamin (RENAL MULTIVITAMIN/ZINC PO), Take 1 tablet by mouth every evening., Disp: , Rfl:    nitroGLYCERIN (NITROSTAT) 0.4 MG SL tablet, DISSOLVE ONE TABLET UNDER THE TONGUE EVERY 5 MINUTES AS NEEDED FOR CHEST PAIN., Disp: 25 tablet, Rfl: 1   OVER THE COUNTER MEDICATION, Take 3 drops by mouth every evening. Cortexi drops, Disp: , Rfl:    OVER THE COUNTER MEDICATION, Take 1 tablet by mouth every evening. cocoavia, Disp: , Rfl:    polyethylene glycol (MIRALAX / GLYCOLAX) 17 g packet, Take 17 g by mouth daily., Disp: 14 each, Rfl: 0   rosuvastatin (CRESTOR) 40 MG tablet, Take 1 tablet by mouth once daily (Patient taking differently: Take 40 mg by mouth every evening.), Disp: 90 tablet, Rfl: 0  Current Facility-Administered Medications:    betamethasone acetate-betamethasone  sodium phosphate (CELESTONE) injection 3 mg, 3 mg, Intramuscular, Once, Edrick Kins, DPM  Clinical Interview:   The following information was obtained during a clinical interview with [name] and his daughter-in-law prior to cognitive testing.  Cognitive Symptoms: Decreased short-term memory: Endorsed. [name] described generalized memory impairment, with a primary area of difficulty surrounding word finding. With direct questioning, he reported trouble recalling details of past conversations and names. [name] described greater overall confusion, especially surrounding memory. Dysfunction was said to be noticeable for the past year or so and does seem to have gradually worsened  over time. However, his daughter-in-law also added that the past month has been relatively positive.  Decreased long-term memory: Denied. Decreased attention/concentration: Denied. Reduced processing speed: Denied. Difficulties with executive functions: Denied. [name] did note that it has been harder for [name] to recognize scams. However, he is good about asking others if something he comes across is legitimate. They both denied trouble with impulsivity or any significant personality changes.  Difficulties with emotion regulation: Denied. Difficulties with receptive language: Denied. Difficulties with word finding: Endorsed. Decreased visuoperceptual ability: Denied.  Difficulties completing ADLs: Endorsed. He receives family assistance surrounding medication management and reminders to take medications on time. His wife manages finances and bill paying which is longstanding in nature. He has not driven since 4132 due to concerns surrounding a potential stroke and residual impairment.   Additional Medical History: History of traumatic brain injury/concussion: Denied. He did fall and hit the right side of his face approximately two weeks prior. However, he denied a loss in consciousness and was not formally diagnosed with a concussion when evaluated in the ED per his daughter-in-law. No other potential head injuries were reported.  History of stroke: Medical records, neuroimaging, and the timeline of dysfunction as described above would favor the presence of a TIA over the occurrence of a small stroke. History of seizure activity: Denied. History of known exposure to toxins: Denied. Symptoms of chronic pain: Endorsed. Pain symptoms were primarily localized to his right shoulder.  Experience of frequent headaches/migraines: Denied. Frequent instances of dizziness/vertigo: Denied. [name] highlighted that he will experience some dizziness when he stands quickly. After  this, he acknowledged being told by his physicians that he has experienced orthostatic hypotension.   Sensory changes: He reported ongoing visual acuity decline. Vision can also be blurred due to allergies creating tears in his eyes. He also reported hearing loss bilaterally. Other sensory changes/difficulties (e.g., taste or smell) were denied.  Balance/coordination difficulties: Endorsed. Balance instability was attributed to diabetic neuropathy. He has been favoring his left side lately given right-sided pain. However, specific to balance, he did not report greater weakness on the right side of his body. As stated above, he experienced a fall approximately two weeks prior to the current evaluation. Outside of this, frequent falling was not reported.  Other motor difficulties: Denied.  Sleep History: Estimated hours obtained each night: Unclear as he described his sleep as very broken in nature.  Difficulties falling asleep: Denied. Difficulties staying asleep: Endorsed. He wakes frequently throughout the night due to shoulder pain, as well as needing to use the restroom.  Feels rested and refreshed upon awakening: Denied. He reported feeling as though he is "ready to take a nap" after awakening.   History of snoring: Endorsed. History of waking up gasping for air: Endorsed. Witnessed breath cessation while asleep: Endorsed. He acknowledged a history of obstructive sleep apnea. He trailed a CPAP machine 20+ years  prior and could not tolerate this device. As such, his sleep apnea remains untreated.   History of vivid dreaming: Denied. Excessive movement while asleep: Denied. Instances of acting out his dreams: Denied.  Psychiatric/Behavioral Health History: Depression: Denied. He described his mood as being very positive and he denied any previous mental health concerns or diagnoses. Current or remote suicidal ideation, intent, or plan was denied.  Anxiety: Denied. Mania: Denied. Trauma  History: Denied. Visual/auditory hallucinations: Denied. Delusional thoughts: Denied.  Tobacco: Denied. Alcohol: He reported very infrequent alcohol consumption and denied a history of problematic alcohol abuse or dependence.  Recreational drugs: Denied.  Family History: Problem Relation Age of Onset   Memory loss Mother        with very advanced age   Heart disease Father    This information was confirmed by [name].  Academic/Vocational History: Highest level of educational attainment: 18 years. [name] grew up and completed all academic pursuits in [place]. He reported completing a total of 13 years of formal education prior to completing five years of university. 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 No relative weaknesses were identified. His primary language is [place] but he is conversationally fluent in [place].  History of developmental delay: Denied. History of grade repetition: Denied. Enrollment in special education courses: Denied. History of LD/ADHD: Denied.  Employment: Retired. 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   Evaluation Results:   Behavioral Observations: [name] was accompanied by his daughter-in-law, arrived to his appointment on time, and was appropriately dressed and groomed. He appeared alert and oriented. He ambulated with the assistance of a rolling walker. He maneuvered this device well and with good pace. No episodes of frank instability were observed. Gross motor functioning appeared intact upon informal observation and no abnormal movements (e.g., tremors) were noted. His affect was generally relaxed and positive. Spontaneous speech was fluent. Very mild word finding difficulties were observed during interview. Thought processes were coherent, organized, and normal in content. Insight into his cognitive difficulties appeared generally adequate.   During testing, sustained attention was appropriate.  Task engagement was adequate and he persisted when challenged. He fatigued quite quickly as the evaluation progressed. It was abbreviated in response. Overall, [name] was cooperative with the clinical interview and subsequent testing procedures.   Adequacy of Effort: The validity of neuropsychological testing is limited by the extent to which the individual being tested may be assumed to have exerted adequate effort during testing. [name] expressed his intention to perform to the best of his abilities and exhibited adequate task engagement and persistence. Scores across stand-alone and embedded performance validity measures were variable. However, his one below expectation performance is likely due to his history of cerebrovascular illness and acute fatigue rather than poor engagement or attempts to perform poorly. As such, the results of the current evaluation are believed to be a valid representation of [name] current cognitive functioning.  Test Results: [name] was fully oriented at the time of the current evaluation.  Intellectual abilities based upon educational and vocational attainment were estimated to be in the average range. Premorbid abilities were estimated to be within the well above average range based upon a single-word reading test.   Processing speed was variable, ranging from the exceptionally low to average normative ranges. Basic attention was well below average. More complex attention (e.g., working memory) was unable to be assessed. Cognitive flexibility was below average. Other aspects of executive functioning was unable  to be assessed. Safety/judgment was unable to be assessed.  Assessed receptive language abilities were unable to be assessed. However, they are believed to be adequate as Mr. Calk did not exhibit any difficulties comprehending task instructions and answered all questions asked of him appropriately. Assessed expressive language was mildly  variable. Phonemic fluency was below average, semantic fluency was well below average to average, and confrontation naming was well below average to above average.   Assessed visuospatial/visuoconstructional abilities were average to above average.    Learning (i.e., encoding) of novel verbal information was average. Spontaneous delayed recall (i.e., retrieval) of previously learned information was also average. Retention rates were 70% across a story learning task, 50% across a list learning task, and 68% across a figure drawing task. Performance across recognition tasks was below average to above average, suggesting evidence for information consolidation.   Results of emotional screening instruments suggested that recent symptoms of generalized anxiety were in the minimal range, while symptoms of depression were within normal limits. A screening instrument assessing recent sleep quality suggested the presence of moderate sleep dysfunction.  Tables of Scores:   Note: This summary of test scores accompanies the interpretive report and should not be considered in isolation without reference to the appropriate sections in the text. Descriptors are based on appropriate normative data and may be adjusted based on clinical judgment. Terms such as "Within Normal Limits" and "Outside Normal Limits" are used when a more specific description of the test score cannot be determined.       Percentile - Normative Descriptor > 98 - Exceptionally High 91-97 - Well Above Average 75-90 - Above Average 25-74 - Average 9-24 - Below Average 2-8 - Well Below Average < 2 - Exceptionally Low       Validity:   DESCRIPTOR       Dot Counting Test: --- --- Within Normal Limits  RBANS Effort Index: --- --- Outside Normal Limits       Orientation:      Raw Score Percentile   NAB Orientation, Form 1 29/29 --- ---       Cognitive Screening:      Raw Score Percentile   SLUMS: 25/30 --- ---       RBANS, Form A:  Standard Score/ Scaled Score Percentile   Total Score 83 13 Below Average  Immediate Memory 97 42 Average    List Learning 9 37 Average    Story Memory 10 50 Average  Visuospatial/Constructional 105 63 Average    Figure Copy 12 75 Above Average    Line Orientation 16/20 26-50 Average  Language 89 23 Below Average    Picture Naming 10/10 >75 Above Average    Semantic Fluency 5 5 Well Below Average  Attention 60 <1 Exceptionally Low    Digit Span 5 5 Well Below Average    Coding 3 1 Exceptionally Low  Delayed Memory 86 18 Below Average    List Recall 4/10 51-75 Average    List Recognition 17/20 10-16 Below Average    Story Recall 9 37 Average    Story Recognition 12/12 87+ Above Average    Figure Recall 11 63 Average    Figure Recognition 7/8 69-83 Above Average       Intellectual Functioning:      Standard Score Percentile   Test of Premorbid Functioning: 125 95 Well Above Average       Attention/Executive Function:     Trail Making Test (TMT): Raw Score (Scaled Score) Percentile  Part A 60 secs.,  0 errors (8) 25 Average    Part B 196 secs.,  1 error (7) 16 Below Average  *Based on [employer] Older Normative Studies (MOANS)          Language:     Verbal Fluency Test: Raw Score (Scaled Score) Percentile     Phonemic Fluency (CFL) 19 (7) 16 Below Average    Category Fluency 31 (9) 37 Average  *Based on [employer] Older Normative Studies (MOANS)          NAB Language Module, Form 1: T Score Percentile     Naming 26/31 (35) 7 Well Below Average       Visuospatial/Visuoconstruction:      Raw Score Percentile   Clock Drawing: 10/10 --- Within Normal Limits       Mood and Personality:      Raw Score Percentile   PROMIS Depression Questionnaire: 8 --- None to Slight  PROMIS Anxiety Questionnaire: 9 --- None to Slight       Additional Questionnaires:      Raw Score Percentile   PROMIS Sleep Disturbance Questionnaire: 31 --- Moderate   Informed Consent and  Coding/Compliance:   The current evaluation represents a clinical evaluation for the purposes previously outlined by the referral source and is in no way reflective of a forensic evaluation.   Mr. Amend was provided with a verbal description of the nature and purpose of the present neuropsychological evaluation. Also reviewed were the foreseeable risks and/or discomforts and benefits of the procedure, limits of confidentiality, and mandatory reporting requirements of this provider. The patient was given the opportunity to ask questions and receive answers about the evaluation. Oral consent to participate was provided by the patient.   This evaluation was conducted by Christia Reading, Ph.D., ABPP-CN, board certified clinical neuropsychologist. Mr. Reisig completed a clinical interview with Dr. Melvyn Novas, billed as one unit [phone], and 100 minutes of cognitive testing and scoring, billed as one unit [phone] and two additional units [id number]. Psychometrist Milana Kidney, B.S., assisted Dr. Melvyn Novas with test administration and scoring procedures. As a separate and discrete service, Dr. Melvyn Novas spent a total of 160 minutes in interpretation and report writing billed as one unit [phone] and two units [id number].

## 2022-03-24 ENCOUNTER — Encounter: Payer: Self-pay | Admitting: Psychology

## 2022-03-24 DIAGNOSIS — F03A3 Unspecified dementia, mild, with mood disturbance: Secondary | ICD-10-CM | POA: Diagnosis not present

## 2022-03-24 DIAGNOSIS — I679 Cerebrovascular disease, unspecified: Secondary | ICD-10-CM

## 2022-03-24 DIAGNOSIS — E1122 Type 2 diabetes mellitus with diabetic chronic kidney disease: Secondary | ICD-10-CM | POA: Diagnosis not present

## 2022-03-24 DIAGNOSIS — N184 Chronic kidney disease, stage 4 (severe): Secondary | ICD-10-CM | POA: Diagnosis not present

## 2022-03-24 DIAGNOSIS — I130 Hypertensive heart and chronic kidney disease with heart failure and stage 1 through stage 4 chronic kidney disease, or unspecified chronic kidney disease: Secondary | ICD-10-CM | POA: Diagnosis not present

## 2022-03-24 DIAGNOSIS — I5042 Chronic combined systolic (congestive) and diastolic (congestive) heart failure: Secondary | ICD-10-CM | POA: Diagnosis not present

## 2022-03-24 DIAGNOSIS — I639 Cerebral infarction, unspecified: Secondary | ICD-10-CM | POA: Insufficient documentation

## 2022-03-24 DIAGNOSIS — I2510 Atherosclerotic heart disease of native coronary artery without angina pectoris: Secondary | ICD-10-CM | POA: Diagnosis not present

## 2022-03-24 HISTORY — DX: Cerebrovascular disease, unspecified: I67.9

## 2022-03-28 ENCOUNTER — Ambulatory Visit (INDEPENDENT_AMBULATORY_CARE_PROVIDER_SITE_OTHER): Payer: Medicare Other

## 2022-03-28 DIAGNOSIS — I5042 Chronic combined systolic (congestive) and diastolic (congestive) heart failure: Secondary | ICD-10-CM | POA: Diagnosis not present

## 2022-03-28 DIAGNOSIS — F03A3 Unspecified dementia, mild, with mood disturbance: Secondary | ICD-10-CM | POA: Diagnosis not present

## 2022-03-28 DIAGNOSIS — E1122 Type 2 diabetes mellitus with diabetic chronic kidney disease: Secondary | ICD-10-CM | POA: Diagnosis not present

## 2022-03-28 DIAGNOSIS — I495 Sick sinus syndrome: Secondary | ICD-10-CM

## 2022-03-28 DIAGNOSIS — Z950 Presence of cardiac pacemaker: Secondary | ICD-10-CM | POA: Diagnosis not present

## 2022-03-28 DIAGNOSIS — I130 Hypertensive heart and chronic kidney disease with heart failure and stage 1 through stage 4 chronic kidney disease, or unspecified chronic kidney disease: Secondary | ICD-10-CM | POA: Diagnosis not present

## 2022-03-28 DIAGNOSIS — N184 Chronic kidney disease, stage 4 (severe): Secondary | ICD-10-CM | POA: Diagnosis not present

## 2022-03-28 DIAGNOSIS — I2510 Atherosclerotic heart disease of native coronary artery without angina pectoris: Secondary | ICD-10-CM | POA: Diagnosis not present

## 2022-03-28 LAB — CUP PACEART REMOTE DEVICE CHECK
Battery Remaining Longevity: 85 mo
Battery Remaining Percentage: 72 %
Battery Voltage: 2.99 V
Brady Statistic AP VP Percent: 16 %
Brady Statistic AP VS Percent: 1 %
Brady Statistic AS VP Percent: 83 %
Brady Statistic AS VS Percent: 1 %
Brady Statistic RA Percent Paced: 16 %
Brady Statistic RV Percent Paced: 99 %
Date Time Interrogation Session: 20231107030014
Implantable Lead Connection Status: 753985
Implantable Lead Connection Status: 753985
Implantable Lead Implant Date: 19901207
Implantable Lead Implant Date: 19901207
Implantable Lead Location: 753859
Implantable Lead Location: 753860
Implantable Pulse Generator Implant Date: 20201116
Lead Channel Impedance Value: 290 Ohm
Lead Channel Impedance Value: 560 Ohm
Lead Channel Pacing Threshold Amplitude: 1 V
Lead Channel Pacing Threshold Amplitude: 1.125 V
Lead Channel Pacing Threshold Pulse Width: 0.4 ms
Lead Channel Pacing Threshold Pulse Width: 0.4 ms
Lead Channel Sensing Intrinsic Amplitude: 12 mV
Lead Channel Sensing Intrinsic Amplitude: 4.3 mV
Lead Channel Setting Pacing Amplitude: 1.375
Lead Channel Setting Pacing Amplitude: 2 V
Lead Channel Setting Pacing Pulse Width: 0.4 ms
Lead Channel Setting Sensing Sensitivity: 3 mV
Pulse Gen Model: 2272
Pulse Gen Serial Number: 9177614

## 2022-03-29 DIAGNOSIS — I5042 Chronic combined systolic (congestive) and diastolic (congestive) heart failure: Secondary | ICD-10-CM | POA: Diagnosis not present

## 2022-03-29 DIAGNOSIS — N184 Chronic kidney disease, stage 4 (severe): Secondary | ICD-10-CM | POA: Diagnosis not present

## 2022-03-29 DIAGNOSIS — I130 Hypertensive heart and chronic kidney disease with heart failure and stage 1 through stage 4 chronic kidney disease, or unspecified chronic kidney disease: Secondary | ICD-10-CM | POA: Diagnosis not present

## 2022-03-29 DIAGNOSIS — E1122 Type 2 diabetes mellitus with diabetic chronic kidney disease: Secondary | ICD-10-CM | POA: Diagnosis not present

## 2022-03-29 DIAGNOSIS — F03A3 Unspecified dementia, mild, with mood disturbance: Secondary | ICD-10-CM | POA: Diagnosis not present

## 2022-03-29 DIAGNOSIS — I2510 Atherosclerotic heart disease of native coronary artery without angina pectoris: Secondary | ICD-10-CM | POA: Diagnosis not present

## 2022-03-30 ENCOUNTER — Encounter: Payer: Medicare Other | Admitting: Psychology

## 2022-03-30 ENCOUNTER — Ambulatory Visit (INDEPENDENT_AMBULATORY_CARE_PROVIDER_SITE_OTHER): Payer: Medicare Other | Admitting: Psychology

## 2022-03-30 DIAGNOSIS — F01A0 Vascular dementia, mild, without behavioral disturbance, psychotic disturbance, mood disturbance, and anxiety: Secondary | ICD-10-CM

## 2022-03-30 DIAGNOSIS — I679 Cerebrovascular disease, unspecified: Secondary | ICD-10-CM

## 2022-03-30 NOTE — Progress Notes (Signed)
   Neuropsychology Feedback Session Tillie Rung. [place] Department of Neurology  Reason for Referral:   [name] is a 88 y.o. right-handed Caucasian male referred by Sharene Butters, PA-C, to characterize his current cognitive functioning and assist with diagnostic clarity and treatment planning in the context of subjective cognitive decline and numerous cardiovascular comorbidities.   Feedback:   [name] completed a comprehensive neuropsychological evaluation on 03/23/2022. Please refer to that encounter for the full report and recommendations. Briefly, results suggested an isolated impairment surrounding attention/concentration. Performance variability was exhibited across processing speed, semantic fluency, and confrontation naming. However, it should be highlighted that testing was completed in English rather than his native [place] and that this fact by itself could certainly account for weaker scores across expressive language tasks rather than neurological decline. Despite what can be found in previous medical records, memory testing is certainly not suggestive of Alzheimer's disease as all aspects of memory testing were appropriate when compared to age-matched peers. Regarding etiology, the most likely cause for ongoing dysfunction appears to be a combination of cerebrovascular disease and other complex medical ailments. 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 Testing patterns align with the expected impact of these ongoing ailments and neuroimaging findings well.   [name] was accompanied by his wife and daughter-in-law during the current feedback session. Content of the current  session focused on the results of his neuropsychological evaluation. [name] was given the opportunity to ask questions and his questions were answered. He was encouraged to reach out should additional questions arise. A copy of his report was provided at the conclusion of the visit.      20 minutes were spent conducting the current feedback session with [name], billed as one unit [phone].

## 2022-03-31 DIAGNOSIS — I2510 Atherosclerotic heart disease of native coronary artery without angina pectoris: Secondary | ICD-10-CM | POA: Diagnosis not present

## 2022-03-31 DIAGNOSIS — I130 Hypertensive heart and chronic kidney disease with heart failure and stage 1 through stage 4 chronic kidney disease, or unspecified chronic kidney disease: Secondary | ICD-10-CM | POA: Diagnosis not present

## 2022-03-31 DIAGNOSIS — F03A3 Unspecified dementia, mild, with mood disturbance: Secondary | ICD-10-CM | POA: Diagnosis not present

## 2022-03-31 DIAGNOSIS — E1122 Type 2 diabetes mellitus with diabetic chronic kidney disease: Secondary | ICD-10-CM | POA: Diagnosis not present

## 2022-03-31 DIAGNOSIS — N184 Chronic kidney disease, stage 4 (severe): Secondary | ICD-10-CM | POA: Diagnosis not present

## 2022-03-31 DIAGNOSIS — I5042 Chronic combined systolic (congestive) and diastolic (congestive) heart failure: Secondary | ICD-10-CM | POA: Diagnosis not present

## 2022-04-03 DIAGNOSIS — I2510 Atherosclerotic heart disease of native coronary artery without angina pectoris: Secondary | ICD-10-CM | POA: Diagnosis not present

## 2022-04-03 DIAGNOSIS — I130 Hypertensive heart and chronic kidney disease with heart failure and stage 1 through stage 4 chronic kidney disease, or unspecified chronic kidney disease: Secondary | ICD-10-CM | POA: Diagnosis not present

## 2022-04-03 DIAGNOSIS — F03A3 Unspecified dementia, mild, with mood disturbance: Secondary | ICD-10-CM | POA: Diagnosis not present

## 2022-04-03 DIAGNOSIS — I5042 Chronic combined systolic (congestive) and diastolic (congestive) heart failure: Secondary | ICD-10-CM | POA: Diagnosis not present

## 2022-04-03 DIAGNOSIS — N184 Chronic kidney disease, stage 4 (severe): Secondary | ICD-10-CM | POA: Diagnosis not present

## 2022-04-03 DIAGNOSIS — E1122 Type 2 diabetes mellitus with diabetic chronic kidney disease: Secondary | ICD-10-CM | POA: Diagnosis not present

## 2022-04-04 DIAGNOSIS — I130 Hypertensive heart and chronic kidney disease with heart failure and stage 1 through stage 4 chronic kidney disease, or unspecified chronic kidney disease: Secondary | ICD-10-CM | POA: Diagnosis not present

## 2022-04-04 DIAGNOSIS — I2510 Atherosclerotic heart disease of native coronary artery without angina pectoris: Secondary | ICD-10-CM | POA: Diagnosis not present

## 2022-04-04 DIAGNOSIS — E1122 Type 2 diabetes mellitus with diabetic chronic kidney disease: Secondary | ICD-10-CM | POA: Diagnosis not present

## 2022-04-04 DIAGNOSIS — F03A3 Unspecified dementia, mild, with mood disturbance: Secondary | ICD-10-CM | POA: Diagnosis not present

## 2022-04-04 DIAGNOSIS — I5042 Chronic combined systolic (congestive) and diastolic (congestive) heart failure: Secondary | ICD-10-CM | POA: Diagnosis not present

## 2022-04-04 DIAGNOSIS — N184 Chronic kidney disease, stage 4 (severe): Secondary | ICD-10-CM | POA: Diagnosis not present

## 2022-04-06 ENCOUNTER — Encounter: Payer: Self-pay | Admitting: Physician Assistant

## 2022-04-06 ENCOUNTER — Ambulatory Visit (INDEPENDENT_AMBULATORY_CARE_PROVIDER_SITE_OTHER): Payer: Medicare Other | Admitting: Physician Assistant

## 2022-04-06 VITALS — BP 128/62 | HR 74 | Resp 20 | Ht 68.0 in | Wt 232.0 lb

## 2022-04-06 DIAGNOSIS — I2510 Atherosclerotic heart disease of native coronary artery without angina pectoris: Secondary | ICD-10-CM

## 2022-04-06 DIAGNOSIS — F01A0 Vascular dementia, mild, without behavioral disturbance, psychotic disturbance, mood disturbance, and anxiety: Secondary | ICD-10-CM

## 2022-04-06 NOTE — Patient Instructions (Addendum)
It was a pleasure to see you today at our office.   Recommendations:  Follow up 6 month Continue Memantine 10 mg daily.   Increase socializing  Recommend sleep study for sleep apnea  Whom to call:  Memory  decline, memory medications: Call our office [phone]   For psychiatric meds, mood meds: Please have your primary care physician manage these medications.    For assessment of decision of mental capacity and competency:  Call Dr. Anthoney Harada, geriatric psychiatrist at [phone]  For guidance in geriatric dementia issues please call [facility] [phone]  For guidance regarding [facility] and if placement were needed at the facility, contact Arnell Asal, Social Worker tel: [phone]  If you have any severe symptoms of a stroke, or other severe issues such as confusion,severe chills or fever, etc call 911 or go to the ER as you may need to be evaluated further   Feel free to visit Facebook page " [email or web address]" for tips of how to care for people with memory problems.    RECOMMENDATIONS FOR ALL PATIENTS WITH MEMORY PROBLEMS: 1. Continue to exercise (Recommend 30 minutes of walking everyday, or 3 hours every week) 2. Increase social interactions - continue going to [place] and enjoy social gatherings with friends and family 3. Eat healthy, avoid fried foods and eat more fruits and vegetables 4. Maintain adequate blood pressure, blood sugar, and blood cholesterol level. Reducing the risk of stroke and cardiovascular disease also helps promoting better memory. 5. Avoid stressful situations. Live a simple life and avoid aggravations. Organize your time and prepare for the next day in anticipation. 6. Sleep well, avoid any interruptions of sleep and avoid any distractions in the bedroom that may interfere with adequate sleep quality 7. Avoid sugar, avoid sweets as there is a strong link between excessive sugar intake, diabetes, and cognitive  impairment We discussed the Mediterranean diet, which has been shown to help patients reduce the risk of progressive memory disorders and reduces cardiovascular risk. This includes eating fish, eat fruits and green leafy vegetables, nuts like almonds and hazelnuts, walnuts, and also use olive oil. Avoid fast foods and fried foods as much as possible. Avoid sweets and sugar as sugar use has been linked to worsening of memory function.  There is always a concern of gradual progression of memory problems. If this is the case, then we may need to adjust level of care according to patient needs. Support, both to the patient and caregiver, should then be put into place.    The Alzheimer's Association is here all day, every day for people facing Alzheimer's disease through our free 24/7 Helpline: (518) 630-5915. The Helpline provides reliable information and support to all those who need assistance, such as individuals living with memory loss, Alzheimer's or other dementia, caregivers, health care professionals and the public.  Our highly trained and knowledgeable staff can help you with: Understanding memory loss, dementia and Alzheimer's  Medications and other treatment options  General information about aging and brain health  Skills to provide quality care and to find the best care from professionals  Legal, financial and living-arrangement decisions Our Helpline also features: Confidential care consultation provided by master's level clinicians who can help with decision-making support, crisis assistance and education on issues families face every day  Help in a caller's preferred language using our translation service that features more than 200 languages and dialects  Referrals to local community programs, services and ongoing support  FALL PRECAUTIONS: Be cautious when walking. Scan the area for obstacles that may increase the risk of trips and falls. When getting up in the mornings, sit up at  the edge of the bed for a few minutes before getting out of bed. Consider elevating the bed at the head end to avoid drop of blood pressure when getting up. Walk always in a well-lit room (use night lights in the walls). Avoid area rugs or power cords from appliances in the middle of the walkways. Use a walker or a cane if necessary and consider physical therapy for balance exercise. Get your eyesight checked regularly.  FINANCIAL OVERSIGHT: Supervision, especially oversight when making financial decisions or transactions is also recommended.  HOME SAFETY: Consider the safety of the kitchen when operating appliances like stoves, microwave oven, and blender. Consider having supervision and share cooking responsibilities until no longer able to participate in those. Accidents with firearms and other hazards in the house should be identified and addressed as well.   ABILITY TO BE LEFT ALONE: If patient is unable to contact 911 operator, consider using LifeLine, or when the need is there, arrange for someone to stay with patients. Smoking is a fire hazard, consider supervision or cessation. Risk of wandering should be assessed by caregiver and if detected at any point, supervision and safe proof recommendations should be instituted.  MEDICATION SUPERVISION: Inability to self-administer medication needs to be constantly addressed. Implement a mechanism to ensure safe administration of the medications.        Mediterranean Diet A Mediterranean diet refers to food and lifestyle choices that are based on the traditions of countries located on the [employer]. This way of eating has been shown to help prevent certain conditions and improve outcomes for people who have chronic diseases, like kidney disease and heart disease. What are tips for following this plan? Lifestyle  Cook and eat meals together with your family, when possible. Drink enough fluid to keep your urine clear or pale yellow. Be  physically active every day. This includes: Aerobic exercise like running or swimming. Leisure activities like gardening, walking, or housework. Get 7-8 hours of sleep each night. If recommended by your health care provider, drink red wine in moderation. This means 1 glass a day for nonpregnant women and 2 glasses a day for men. A glass of wine equals 5 oz (150 mL). Reading food labels  Check the serving size of packaged foods. For foods such as rice and pasta, the serving size refers to the amount of cooked product, not dry. Check the total fat in packaged foods. Avoid foods that have saturated fat or trans fats. Check the ingredients list for added sugars, such as corn syrup. Shopping  At the grocery store, buy most of your food from the areas near the walls of the store. This includes: Fresh fruits and vegetables (produce). Grains, beans, nuts, and seeds. Some of these may be available in unpackaged forms or large amounts (in bulk). Fresh seafood. Poultry and eggs. Low-fat dairy products. Buy whole ingredients instead of prepackaged foods. Buy fresh fruits and vegetables in-season from local farmers markets. Buy frozen fruits and vegetables in resealable bags. If you do not have access to quality fresh seafood, buy precooked frozen shrimp or canned fish, such as tuna, salmon, or sardines. Buy small amounts of raw or cooked vegetables, salads, or olives from the deli or salad bar at your store. Stock your pantry so you always have certain foods on hand, such as  olive oil, canned tuna, canned tomatoes, rice, pasta, and beans. Cooking  Cook foods with extra-virgin olive oil instead of using butter or other vegetable oils. Have meat as a side dish, and have vegetables or grains as your main dish. This means having meat in small portions or adding small amounts of meat to foods like pasta or stew. Use beans or vegetables instead of meat in common dishes like chili or lasagna. Experiment with  different cooking methods. Try roasting or broiling vegetables instead of steaming or sauteing them. Add frozen vegetables to soups, stews, pasta, or rice. Add nuts or seeds for added healthy fat at each meal. You can add these to yogurt, salads, or vegetable dishes. Marinate fish or vegetables using olive oil, lemon juice, garlic, and fresh herbs. Meal planning  Plan to eat 1 vegetarian meal one day each week. Try to work up to 2 vegetarian meals, if possible. Eat seafood 2 or more times a week. Have healthy snacks readily available, such as: Vegetable sticks with hummus. Greek yogurt. Fruit and nut trail mix. Eat balanced meals throughout the week. This includes: Fruit: 2-3 servings a day Vegetables: 4-5 servings a day Low-fat dairy: 2 servings a day Fish, poultry, or lean meat: 1 serving a day Beans and legumes: 2 or more servings a week Nuts and seeds: 1-2 servings a day Whole grains: 6-8 servings a day Extra-virgin olive oil: 3-4 servings a day Limit red meat and sweets to only a few servings a month What are my food choices? Mediterranean diet Recommended Grains: Whole-grain pasta. Brown rice. Bulgar wheat. Polenta. Couscous. Whole-wheat bread. [place]. Vegetables: Artichokes. Beets. Broccoli. Cabbage. Carrots. Eggplant. Green beans. Chard. Kale. Spinach. Onions. Leeks. Peas. Squash. Tomatoes. Peppers. Radishes. Fruits: Apples. Apricots. Avocado. Berries. Bananas. Cherries. Dates. Figs. Grapes. Lemons. Melon. Oranges. Peaches. Plums. Pomegranate. Meats and other protein foods: Beans. Almonds. Sunflower seeds. Pine nuts. Peanuts. [place]. Salmon. Scallops. Shrimp. [place]. Tilapia. Clams. Oysters. Eggs. Dairy: Low-fat milk. Cheese. Greek yogurt. Beverages: Water. Red wine. Herbal tea. Fats and oils: Extra virgin olive oil. Avocado oil. Grape seed oil. Sweets and desserts: [place] yogurt with honey. Baked apples. Poached pears. Trail mix. Seasoning and other foods: Basil.  Cilantro. Coriander. Cumin. Mint. Parsley. Sage. Rosemary. Tarragon. Garlic. Oregano. Thyme. Pepper. Balsalmic vinegar. Tahini. Hummus. Tomato sauce. Olives. Mushrooms. Limit these Grains: Prepackaged pasta or rice dishes. Prepackaged cereal with added sugar. Vegetables: Deep fried potatoes (french fries). Fruits: Fruit canned in syrup. Meats and other protein foods: Beef. Pork. Lamb. Poultry with skin. Hot dogs. Berniece Salines. Dairy: Ice cream. Sour cream. Whole milk. Beverages: Juice. Sugar-sweetened soft drinks. Beer. Liquor and spirits. Fats and oils: Butter. Canola oil. Vegetable oil. Beef fat (tallow). Lard. Sweets and desserts: Cookies. Cakes. Pies. Candy. Seasoning and other foods: Mayonnaise. Premade sauces and marinades. The items listed may not be a complete list. Talk with your dietitian about what dietary choices are right for you. Summary The Mediterranean diet includes both food and lifestyle choices. Eat a variety of fresh fruits and vegetables, beans, nuts, seeds, and whole grains. Limit the amount of red meat and sweets that you eat. Talk with your health care provider about whether it is safe for you to drink red wine in moderation. This means 1 glass a day for nonpregnant women and 2 glasses a day for men. A glass of wine equals 5 oz (150 mL). This information is not intended to replace advice given to you by your health care provider. Make sure you discuss any  questions you have with your health care provider. Document Released: 12/30/2015 Document Revised: 02/01/2016 Document Reviewed: 12/30/2015 Elsevier Interactive Patient Education  2017 [employer].

## 2022-04-06 NOTE — Progress Notes (Signed)
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 Patient is on memantine 10 mg twice daily, tolerating well. His overall cognitive status has remained stable, subjectively improved since his prior visit. He had a recent fall with facial trauma after a brief episode of confusion without LOC, negative TIA workup. He was noted to have A1C of 9.1 Likely of no neuro etiology. He is  recovering well with physical therapy.      Recommendations:   Follow up in  6 months. Continue using CPAP for OSA  Continue Memantine 10 mg twice daily. Side effects were discussed  Continue to control mood as per PCP Continue PT Increase socialization Recommend sleep study for sleep apnea ( he has not been using the CPAP in years)     Subjective:   This patient is accompanied in the office by his daughter in law and his wife who supplements the history. Previous records as well as any outside records available were reviewed prior to todays visit.  Last seen on 10/21/2021. Last MoCA on 07/2021 was 20/30     Any changes in memory since last visit? Memory is better," not as uncertain, since he fell 2 weeks ago he is more like himself after leaving the hospital"- family reports. He is able to remember conversations and names of people. He no longer does crossword puzzles. He continues to write letters. He is going back to photography. He continues to not being very active at this time repeats oneself?  Endorsed, not  as much. Disoriented when walking into a room?  Patient denies   Leaving objects in unusual places?  Patient denies   Ambulates  with difficulty?   He has significant arthritis especially on the knee and shoulder, making ambulation more challenging.  Recent falls?  Fell 2 weeks ago as mentioned above, hitting L eye and L forehead, negative workup.  No LOC.  History of seizures?   Patient denies   Wandering behavior?  Patient denies   Patient drives?  No longer drives  Any mood changes since last visit?  His mood is improved since knowing that his Neuropsych evaluation was not suspicious for Alzheimer's at this time. He feels less depressed . He denies any suicidal thought   Any worsening depression?:  Patient denies   Hallucinations?  Patient denies   Paranoia?  Patient denies   Patient reports that sleeps well without vivid dreams, REM behavior or sleepwalking   History of sleep apnea? Endorsed, has not been using the  CPAP Any hygiene concerns?  Endorsed, he has decreased the frequency of showers. "Difficult with the R frozen shoulder".  Independent of bathing and dressing?  Needs assistance Does the patient needs help with medications? Wife and daughter in law In charge  Who is in charge of the finances? Wife is in charge- she is an Optometrist Any changes in appetite?  Patient denies    Patient have trouble swallowing? Patient denies   Does the patient cook?  Patient denies   Any kitchen accidents such as  leaving the stove on? Patient denies   Any headaches?  Patient denies   Double vision? Patient denies   Any focal numbness or tingling?  Patient denies   Chronic back pain Patient denies   Unilateral weakness?  Patient denies   Any tremors?  Patient denies   Any history of anosmia?  Patient denies   Any incontinence of urine? Endorsed Any bowel dysfunction?   Patient denies      Patient lives  with wife and daughter in law     Neurocognitive Testing 03/30/22 Dr. Melvyn Novas Briefly,  results suggested an isolated impairment surrounding attention/concentration. Performance variability was exhibited across processing speed, semantic fluency, and confrontation naming. However, it should be highlighted that testing was completed in English rather than his native [place] and that this fact by itself could certainly account for weaker scores across expressive language tasks rather than neurological decline. Despite what can be found in previous medical records, memory testing is certainly not suggestive of Alzheimer's disease as all aspects of memory testing were appropriate when compared to age-matched peers. Regarding etiology, the most likely cause for ongoing dysfunction appears to be a combination of cerebrovascular disease and other complex medical ailments. 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 Testing patterns align with the expected impact of these ongoing ailments and neuroimaging findings well.    Past Medical History:  Diagnosis Date   Abnormal Doppler ultrasound of carotid artery 12/16/2012   mildly abnormal doppler . no prior studies to compare to. follow up studies are recommended when clinically indicated.   Acute respiratory failure with hypoxia 11/2018   Blepharitis    CAD (coronary artery disease) 07/24/2013   Previously placed drug-eluting stents in the LAD and RCA in 2005, low risk perfusion study December 2011 in January 2014   Cardiomyopathy, ischemic 07/24/2013   EF 45-50% echo March 2014   Carotid stenosis, left 11/2018   on left 60%   Cerebrovascular disease 03/24/2022   CHB (complete heart block) 08/27/2019   Chronic combined systolic and diastolic heart failure 29/93/7169    CKD (chronic kidney disease) stage 3, GFR 30-59 ml/min 07/24/2013   Diabetic hypoglycemia 05/02/2021   Diabetic polyneuropathy 05/02/2021   Gastric ulcer    GI bleed    History of cancer    History of echocardiogram 08/15/2012   EF  45-50 % very difficult study, severe LVH, pacemaker, trace TR,mildly enlarged RV,; aorta mildly calcified   History of stress test 06/06/2012   low risk scan. fixed inferior defect consistant with prior infarction with moderatly reduced EF. no appreciable evidence of ischemia. 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 DDDR ; this is the third generator this pt. has had   Pain due to onychomycosis of toenails of both feet 11/13/2018   Pain in joint of right shoulder 09/15/2021   Second degree AV block 03/09/2014   Sensorineural hearing loss (SNHL) of both ears 04/08/2018   Shingles    Sinus node dysfunction 03/09/2014   TIA (transient ischemic attack)  11/2018   vs stroke not seen on CT   Tinnitus, bilateral 04/08/2018   Type II diabetes mellitus    UTI (lower urinary tract infection)      Past Surgical History:  Procedure Laterality Date   CARDIAC CATHETERIZATION  12-29-2003   rt. coronary angiography was done with a 6 french 4 cm taper coronary cath. This demonstrated  approximately 70 to 75% mildly segmental stenosis in the mid RCA before the acute margin           CARDIAC CATHETERIZATION  11-19-2003   diagnostic cath,, pt. to get second cath to have stent placed   cardiac stents     CATARACT EXTRACTION     KNEE SURGERY Right    PPM GENERATOR CHANGEOUT N/A 04/07/2019   Procedure: PPM GENERATOR CHANGEOUT;  Surgeon: Sanda Klein, MD;  Location: [facility];  Service:  Cardiovascular;  Laterality: N/A;   PROSTATE BIOPSY     prostate seed implants     SPINE SURGERY     WISDOM TOOTH EXTRACTION       PREVIOUS MEDICATIONS:   CURRENT MEDICATIONS:  Outpatient Encounter Medications as of 04/06/2022  Medication Sig   acetaminophen (TYLENOL) 500 MG tablet Take 500 mg by mouth 2 (two) times daily.   aspirin 81 MG tablet Take 81 mg by mouth every evening.    bisacodyl (DULCOLAX) 5 MG EC tablet 3 tablets as needed   clopidogrel (PLAVIX) 75 MG tablet Take 1 tablet by mouth once daily (Patient taking differently: Take 75 mg by mouth every evening.)   Coenzyme Q10 (COQ10) 150 MG CAPS Take 150 mg by mouth every evening.   famotidine (PEPCID) 40 MG tablet Take 40 mg by mouth every evening.   insulin NPH-regular Human (70-30) 100 UNIT/ML injection Inject 50 Units into the skin 2 (two) times daily with a meal. (Patient taking differently: Inject 30-50 Units into the skin 2 (two) times daily with a meal. Inject 50 units in the morning and 30 units at night)   levothyroxine (SYNTHROID, LEVOTHROID) 25 MCG tablet Take 25 mcg by mouth daily.    memantine (NAMENDA) 10 MG tablet Take 1 tablet (10 mg total) by mouth 2 (two) times daily.   metoprolol succinate (TOPROL-XL) 25 MG 24 hr tablet Take 1 tablet (25 mg total) by mouth daily. (Patient taking differently: Take 25 mg by mouth every evening.)   Multiple Vitamin (RENAL MULTIVITAMIN/ZINC PO) Take 1 tablet by mouth every evening.   nitroGLYCERIN (NITROSTAT) 0.4 MG SL tablet DISSOLVE ONE TABLET UNDER THE TONGUE EVERY 5 MINUTES AS NEEDED FOR CHEST PAIN.   OVER THE COUNTER MEDICATION Take 3 drops by mouth every evening. Cortexi drops   OVER THE COUNTER MEDICATION Take 1 tablet by mouth every evening. cocoavia   polyethylene glycol (MIRALAX / GLYCOLAX) 17 g packet Take 17 g by mouth daily.   rosuvastatin (CRESTOR) 40 MG tablet Take 1 tablet by mouth once daily (Patient taking differently: Take 40 mg by mouth every evening.)    Facility-Administered Encounter Medications as of 04/06/2022  Medication   betamethasone acetate-betamethasone sodium phosphate (CELESTONE) injection 3 mg     Objective:     PHYSICAL EXAMINATION:    VITALS:   Vitals:   04/06/22 1433  BP: 128/62  Pulse: 74  Resp: 20  SpO2: 95%  Weight: 232 lb (105.2 kg)  Height: '5\' 8"'$  (1.727 m)    GEN:  The patient appears stated age and is in NAD. HEENT:  Normocephalic, atraumatic.  Neurological examination:  General: NAD, well-groomed, appears stated age. Orientation: The patient is alert. Oriented to person, place and date Cranial nerves: There is good facial symmetry.The speech is fluent and clear. No aphasia or dysarthria. Fund of knowledge is appropriate. Recent memory impaired and remote memory is normal.  Attention and concentration are normal.  Able to name objects and repeat phrases.  Hearing is intact to conversational tone.    Sensation: Sensation is intact to light touch throughout Motor: Strength is at least antigravity x4. Tremors: none  DTR's 2/4 in UE/LE      07/22/2021    7:00 AM  Montreal Cognitive Assessment   Visuospatial/ Executive (0/5) 4  Naming (0/3) 2  Attention: Read list of digits (0/2) 1  Attention: Read list of letters (0/1) 1  Attention: Serial 7 subtraction starting at 100 (0/3) 2  Language: Repeat phrase (0/2) 2  Language : Fluency (0/1) 0  Abstraction (0/2) 2  Delayed Recall (0/5) 0  Orientation (0/6) 6  Total 20  Adjusted Score (based on education) 20        No data to display             Movement examination: Tone: There is normal tone in the UE/LE Abnormal movements:  no tremor.  No myoclonus.  No asterixis.   Coordination:  There is no decremation with RAM's. Normal finger to nose  Gait and Station: The patient has difficulty arising out of a deep-seated chair without the use of the hands. The patient's stride length is good.  Gait is cautious and narrow.   Thank you for  allowing [place] the opportunity to participate in the care of this nice patient. Please do not hesitate to contact us for any questions or concerns.   Total time spent on today's visit was 36 minutes dedicated to this patient today, preparing to see patient, examining the patient, ordering tests and/or medications and counseling the patient, documenting clinical information in the EHR or other health record, independently interpreting results and communicating results to the patient/family, discussing treatment and goals, answering patient's questions and coordinating care.  Cc:  [name], MD  Sharene Butters 04/06/2022 5:05 PM

## 2022-04-07 DIAGNOSIS — Z794 Long term (current) use of insulin: Secondary | ICD-10-CM | POA: Diagnosis not present

## 2022-04-07 DIAGNOSIS — I442 Atrioventricular block, complete: Secondary | ICD-10-CM | POA: Diagnosis not present

## 2022-04-07 DIAGNOSIS — Z6841 Body Mass Index (BMI) 40.0 and over, adult: Secondary | ICD-10-CM | POA: Diagnosis not present

## 2022-04-07 DIAGNOSIS — G4733 Obstructive sleep apnea (adult) (pediatric): Secondary | ICD-10-CM | POA: Diagnosis not present

## 2022-04-07 DIAGNOSIS — Z950 Presence of cardiac pacemaker: Secondary | ICD-10-CM | POA: Diagnosis not present

## 2022-04-07 DIAGNOSIS — E1122 Type 2 diabetes mellitus with diabetic chronic kidney disease: Secondary | ICD-10-CM | POA: Diagnosis not present

## 2022-04-07 DIAGNOSIS — Z7982 Long term (current) use of aspirin: Secondary | ICD-10-CM | POA: Diagnosis not present

## 2022-04-07 DIAGNOSIS — Z955 Presence of coronary angioplasty implant and graft: Secondary | ICD-10-CM | POA: Diagnosis not present

## 2022-04-07 DIAGNOSIS — I5042 Chronic combined systolic (congestive) and diastolic (congestive) heart failure: Secondary | ICD-10-CM | POA: Diagnosis not present

## 2022-04-07 DIAGNOSIS — H01009 Unspecified blepharitis unspecified eye, unspecified eyelid: Secondary | ICD-10-CM | POA: Diagnosis not present

## 2022-04-07 DIAGNOSIS — Z87891 Personal history of nicotine dependence: Secondary | ICD-10-CM | POA: Diagnosis not present

## 2022-04-07 DIAGNOSIS — I2510 Atherosclerotic heart disease of native coronary artery without angina pectoris: Secondary | ICD-10-CM | POA: Diagnosis not present

## 2022-04-07 DIAGNOSIS — I082 Rheumatic disorders of both aortic and tricuspid valves: Secondary | ICD-10-CM | POA: Diagnosis not present

## 2022-04-07 DIAGNOSIS — K219 Gastro-esophageal reflux disease without esophagitis: Secondary | ICD-10-CM | POA: Diagnosis not present

## 2022-04-07 DIAGNOSIS — E1142 Type 2 diabetes mellitus with diabetic polyneuropathy: Secondary | ICD-10-CM | POA: Diagnosis not present

## 2022-04-07 DIAGNOSIS — Z7902 Long term (current) use of antithrombotics/antiplatelets: Secondary | ICD-10-CM | POA: Diagnosis not present

## 2022-04-07 DIAGNOSIS — N184 Chronic kidney disease, stage 4 (severe): Secondary | ICD-10-CM | POA: Diagnosis not present

## 2022-04-07 DIAGNOSIS — F03A4 Unspecified dementia, mild, with anxiety: Secondary | ICD-10-CM | POA: Diagnosis not present

## 2022-04-07 DIAGNOSIS — F03A3 Unspecified dementia, mild, with mood disturbance: Secondary | ICD-10-CM | POA: Diagnosis not present

## 2022-04-07 DIAGNOSIS — E039 Hypothyroidism, unspecified: Secondary | ICD-10-CM | POA: Diagnosis not present

## 2022-04-07 DIAGNOSIS — M1990 Unspecified osteoarthritis, unspecified site: Secondary | ICD-10-CM | POA: Diagnosis not present

## 2022-04-07 DIAGNOSIS — I130 Hypertensive heart and chronic kidney disease with heart failure and stage 1 through stage 4 chronic kidney disease, or unspecified chronic kidney disease: Secondary | ICD-10-CM | POA: Diagnosis not present

## 2022-04-07 DIAGNOSIS — F32A Depression, unspecified: Secondary | ICD-10-CM | POA: Diagnosis not present

## 2022-04-07 DIAGNOSIS — E6609 Other obesity due to excess calories: Secondary | ICD-10-CM | POA: Diagnosis not present

## 2022-04-07 DIAGNOSIS — I495 Sick sinus syndrome: Secondary | ICD-10-CM | POA: Diagnosis not present

## 2022-04-09 ENCOUNTER — Other Ambulatory Visit: Payer: Self-pay | Admitting: Cardiovascular Disease

## 2022-04-10 DIAGNOSIS — I2510 Atherosclerotic heart disease of native coronary artery without angina pectoris: Secondary | ICD-10-CM | POA: Diagnosis not present

## 2022-04-10 DIAGNOSIS — I130 Hypertensive heart and chronic kidney disease with heart failure and stage 1 through stage 4 chronic kidney disease, or unspecified chronic kidney disease: Secondary | ICD-10-CM | POA: Diagnosis not present

## 2022-04-10 DIAGNOSIS — E1122 Type 2 diabetes mellitus with diabetic chronic kidney disease: Secondary | ICD-10-CM | POA: Diagnosis not present

## 2022-04-10 DIAGNOSIS — I5042 Chronic combined systolic (congestive) and diastolic (congestive) heart failure: Secondary | ICD-10-CM | POA: Diagnosis not present

## 2022-04-10 DIAGNOSIS — F03A3 Unspecified dementia, mild, with mood disturbance: Secondary | ICD-10-CM | POA: Diagnosis not present

## 2022-04-10 DIAGNOSIS — N184 Chronic kidney disease, stage 4 (severe): Secondary | ICD-10-CM | POA: Diagnosis not present

## 2022-04-11 DIAGNOSIS — I130 Hypertensive heart and chronic kidney disease with heart failure and stage 1 through stage 4 chronic kidney disease, or unspecified chronic kidney disease: Secondary | ICD-10-CM | POA: Diagnosis not present

## 2022-04-11 DIAGNOSIS — E1122 Type 2 diabetes mellitus with diabetic chronic kidney disease: Secondary | ICD-10-CM | POA: Diagnosis not present

## 2022-04-11 DIAGNOSIS — I5042 Chronic combined systolic (congestive) and diastolic (congestive) heart failure: Secondary | ICD-10-CM | POA: Diagnosis not present

## 2022-04-11 DIAGNOSIS — N184 Chronic kidney disease, stage 4 (severe): Secondary | ICD-10-CM | POA: Diagnosis not present

## 2022-04-11 DIAGNOSIS — I2510 Atherosclerotic heart disease of native coronary artery without angina pectoris: Secondary | ICD-10-CM | POA: Diagnosis not present

## 2022-04-11 DIAGNOSIS — F03A3 Unspecified dementia, mild, with mood disturbance: Secondary | ICD-10-CM | POA: Diagnosis not present

## 2022-04-17 DIAGNOSIS — E1122 Type 2 diabetes mellitus with diabetic chronic kidney disease: Secondary | ICD-10-CM | POA: Diagnosis not present

## 2022-04-17 DIAGNOSIS — I2510 Atherosclerotic heart disease of native coronary artery without angina pectoris: Secondary | ICD-10-CM | POA: Diagnosis not present

## 2022-04-17 DIAGNOSIS — F03A3 Unspecified dementia, mild, with mood disturbance: Secondary | ICD-10-CM | POA: Diagnosis not present

## 2022-04-17 DIAGNOSIS — N184 Chronic kidney disease, stage 4 (severe): Secondary | ICD-10-CM | POA: Diagnosis not present

## 2022-04-17 DIAGNOSIS — I5042 Chronic combined systolic (congestive) and diastolic (congestive) heart failure: Secondary | ICD-10-CM | POA: Diagnosis not present

## 2022-04-17 DIAGNOSIS — I130 Hypertensive heart and chronic kidney disease with heart failure and stage 1 through stage 4 chronic kidney disease, or unspecified chronic kidney disease: Secondary | ICD-10-CM | POA: Diagnosis not present

## 2022-04-21 NOTE — Progress Notes (Signed)
Remote pacemaker transmission.   

## 2022-04-24 ENCOUNTER — Telehealth: Payer: Self-pay | Admitting: Cardiovascular Disease

## 2022-04-24 NOTE — Telephone Encounter (Signed)
  [name] is calling back to f/u. She said, pt's appointment tomorrow is in the morning

## 2022-04-24 NOTE — Telephone Encounter (Signed)
I s/w [name] with the [facility] office and informed the pt has been cleared, no need for SBE.  Ok per Dr. Lorenza Cambridge to hold Plavix, though will defer to the performing provider if Plavix needs to be held. I will fax notes to requesting office.

## 2022-04-24 NOTE — Telephone Encounter (Signed)
   Pre-operative Risk Assessment    Patient Name: [name]  DOB: 10-Sep-1931 MRN: [id number]      Request for Surgical Clearance    Procedure:   surgically removing 4 non restorable teeth   Date of Surgery:  Clearance 04/25/22                                 Surgeon:  Dr. Venita Sheffield Surgeon's Group or Practice Name:  [facility]  Phone number:  [phone] Fax number:  [phone]   Type of Clearance Requested:   - Medical  - Pharmacy:  Hold TBD  TBD   Type of Anesthesia:   with light sedation   Additional requests/questions:      SignedMilbert Coulter   04/24/2022, 11:34 AM

## 2022-04-24 NOTE — Telephone Encounter (Signed)
   Patient Name: [name]  DOB: [birth year] MRN: [id number]  Primary Cardiologist: Sanda Klein, MD  Chart reviewed as part of pre-operative protocol coverage. Given patient's dementia, he is not an ideal candidate for virtual visit and has complex history unlikely to clear by traditional METS. Therefore, reached out to Dr. Sallyanne Kuster for input. Per Dr. Sallyanne Kuster, "Heart failure has been well compensated, but I would be careful with sedation due to his sleep apnea. He can stop clopidogrel if they need to." It is not clear from intake notes whether dental office needed him to hold this prior to surgery tomorrow but we will defer to them to advise patient on if/when to stop. SBE prophylaxis is not required for the patient from a cardiac standpoint.  I will fax this to surgeon's office but will also route to callback to return call to the caller looking for updte.  Charlie Pitter, PA-C 04/24/2022, 4:01 PM

## 2022-04-24 NOTE — Telephone Encounter (Signed)
Preop chart reviewed. H/o chronic combined CHF EF 40-45% by echo 04/2021 felt grossly unchanged from 2020, PPM, CAD s/p remote PCI to LAD and RCA in 2005, IDDM, HTN, HLD, CKD stage IV, morbid obesity, stroke, dementia, mild carotid disease (1-39% BICA). Last OV 09/2021. Admission 02/2022 for AMS, possible TIA; MRI not pursued due to PPM. Given dementia, not ideal candidate for virtual visit. Have reached out to Dr. Sallyanne Kuster in secure chat to review.

## 2022-04-26 DIAGNOSIS — F03A3 Unspecified dementia, mild, with mood disturbance: Secondary | ICD-10-CM | POA: Diagnosis not present

## 2022-04-26 DIAGNOSIS — I5042 Chronic combined systolic (congestive) and diastolic (congestive) heart failure: Secondary | ICD-10-CM | POA: Diagnosis not present

## 2022-04-26 DIAGNOSIS — N184 Chronic kidney disease, stage 4 (severe): Secondary | ICD-10-CM | POA: Diagnosis not present

## 2022-04-26 DIAGNOSIS — I130 Hypertensive heart and chronic kidney disease with heart failure and stage 1 through stage 4 chronic kidney disease, or unspecified chronic kidney disease: Secondary | ICD-10-CM | POA: Diagnosis not present

## 2022-04-26 DIAGNOSIS — E1122 Type 2 diabetes mellitus with diabetic chronic kidney disease: Secondary | ICD-10-CM | POA: Diagnosis not present

## 2022-04-26 DIAGNOSIS — I2510 Atherosclerotic heart disease of native coronary artery without angina pectoris: Secondary | ICD-10-CM | POA: Diagnosis not present

## 2022-05-05 DIAGNOSIS — E1122 Type 2 diabetes mellitus with diabetic chronic kidney disease: Secondary | ICD-10-CM | POA: Diagnosis not present

## 2022-05-05 DIAGNOSIS — F03A3 Unspecified dementia, mild, with mood disturbance: Secondary | ICD-10-CM | POA: Diagnosis not present

## 2022-05-05 DIAGNOSIS — I130 Hypertensive heart and chronic kidney disease with heart failure and stage 1 through stage 4 chronic kidney disease, or unspecified chronic kidney disease: Secondary | ICD-10-CM | POA: Diagnosis not present

## 2022-05-05 DIAGNOSIS — N184 Chronic kidney disease, stage 4 (severe): Secondary | ICD-10-CM | POA: Diagnosis not present

## 2022-05-05 DIAGNOSIS — I5042 Chronic combined systolic (congestive) and diastolic (congestive) heart failure: Secondary | ICD-10-CM | POA: Diagnosis not present

## 2022-05-05 DIAGNOSIS — I2510 Atherosclerotic heart disease of native coronary artery without angina pectoris: Secondary | ICD-10-CM | POA: Diagnosis not present

## 2022-05-09 ENCOUNTER — Encounter: Payer: Self-pay | Admitting: Podiatry

## 2022-05-09 ENCOUNTER — Ambulatory Visit (INDEPENDENT_AMBULATORY_CARE_PROVIDER_SITE_OTHER): Payer: Medicare Other | Admitting: Podiatry

## 2022-05-09 DIAGNOSIS — D689 Coagulation defect, unspecified: Secondary | ICD-10-CM | POA: Diagnosis not present

## 2022-05-09 DIAGNOSIS — M79675 Pain in left toe(s): Secondary | ICD-10-CM | POA: Diagnosis not present

## 2022-05-09 DIAGNOSIS — M79674 Pain in right toe(s): Secondary | ICD-10-CM

## 2022-05-09 DIAGNOSIS — E1140 Type 2 diabetes mellitus with diabetic neuropathy, unspecified: Secondary | ICD-10-CM

## 2022-05-09 DIAGNOSIS — B351 Tinea unguium: Secondary | ICD-10-CM

## 2022-05-09 DIAGNOSIS — Z794 Long term (current) use of insulin: Secondary | ICD-10-CM

## 2022-05-09 NOTE — Progress Notes (Signed)
This patient returns to my office for at risk foot care.  This patient requires this care by a professional since this patient will be at risk due to having diabetes and chronic kidney disease and coagulation defect.  Patient is taking plavix.  This patient is unable to cut nails himself since the patient cannot reach his nails.These nails are painful walking and wearing shoes.  This patient presents for at risk foot care today.  General Appearance  Alert, conversant and in no acute stress.  Vascular  Dorsalis pedis and posterior tibial  pulses are palpable  bilaterally.  Capillary return is within normal limits  bilaterally. Temperature is within normal limits  bilaterally.  Neurologic  Senn-Weinstein monofilament wire test within normal limits  bilaterally. Muscle power within normal limits bilaterally.  Nails Thick disfigured discolored nails with subungual debris  from hallux to fifth toes bilaterally. No evidence of bacterial infection or drainage bilaterally.  Orthopedic  No limitations of motion  feet .  No crepitus or effusions noted.  No bony pathology or digital deformities noted.  Skin  normotropic skin with no porokeratosis noted bilaterally.  No signs of infections or ulcers noted.   Clavi 3rd toe right foot.  Onychomycosis  Pain in right toes  Pain in left toes  Clavi right foot.  Consent was obtained for treatment procedures.   Mechanical debridement of nails 1-5  bilaterally performed with a nail nipper.  Filed with dremel without incident.  Debride clavi with # 15 blade.   Return office visit   3 months                  Told patient to return for periodic foot care and evaluation due to potential at risk complications.   [name] DPM

## 2022-05-21 ENCOUNTER — Encounter (HOSPITAL_COMMUNITY): Payer: Self-pay

## 2022-05-21 ENCOUNTER — Telehealth: Payer: Self-pay | Admitting: Cardiology

## 2022-05-21 ENCOUNTER — Emergency Department (HOSPITAL_COMMUNITY): Payer: Medicare Other

## 2022-05-21 ENCOUNTER — Other Ambulatory Visit: Payer: Self-pay

## 2022-05-21 ENCOUNTER — Observation Stay (HOSPITAL_COMMUNITY)
Admission: EM | Admit: 2022-05-21 | Discharge: 2022-05-22 | Disposition: A | Discharge status: Home / Self Care | Payer: Medicare Other | Attending: Family Medicine | Admitting: Family Medicine

## 2022-05-21 DIAGNOSIS — I959 Hypotension, unspecified: Secondary | ICD-10-CM | POA: Diagnosis not present

## 2022-05-21 DIAGNOSIS — R791 Abnormal coagulation profile: Secondary | ICD-10-CM | POA: Diagnosis not present

## 2022-05-21 DIAGNOSIS — R4182 Altered mental status, unspecified: Secondary | ICD-10-CM | POA: Diagnosis not present

## 2022-05-21 DIAGNOSIS — Z8673 Personal history of transient ischemic attack (TIA), and cerebral infarction without residual deficits: Secondary | ICD-10-CM | POA: Insufficient documentation

## 2022-05-21 DIAGNOSIS — Z79899 Other long term (current) drug therapy: Secondary | ICD-10-CM | POA: Diagnosis not present

## 2022-05-21 DIAGNOSIS — J9600 Acute respiratory failure, unspecified whether with hypoxia or hypercapnia: Secondary | ICD-10-CM | POA: Diagnosis not present

## 2022-05-21 DIAGNOSIS — I2510 Atherosclerotic heart disease of native coronary artery without angina pectoris: Secondary | ICD-10-CM | POA: Diagnosis present

## 2022-05-21 DIAGNOSIS — Z950 Presence of cardiac pacemaker: Secondary | ICD-10-CM | POA: Insufficient documentation

## 2022-05-21 DIAGNOSIS — I255 Ischemic cardiomyopathy: Secondary | ICD-10-CM | POA: Diagnosis present

## 2022-05-21 DIAGNOSIS — U071 COVID-19: Principal | ICD-10-CM | POA: Diagnosis present

## 2022-05-21 DIAGNOSIS — E039 Hypothyroidism, unspecified: Secondary | ICD-10-CM | POA: Diagnosis present

## 2022-05-21 DIAGNOSIS — N184 Chronic kidney disease, stage 4 (severe): Secondary | ICD-10-CM | POA: Diagnosis not present

## 2022-05-21 DIAGNOSIS — Z87891 Personal history of nicotine dependence: Secondary | ICD-10-CM | POA: Insufficient documentation

## 2022-05-21 DIAGNOSIS — E119 Type 2 diabetes mellitus without complications: Secondary | ICD-10-CM

## 2022-05-21 DIAGNOSIS — F0390 Unspecified dementia without behavioral disturbance: Secondary | ICD-10-CM | POA: Insufficient documentation

## 2022-05-21 DIAGNOSIS — Z7902 Long term (current) use of antithrombotics/antiplatelets: Secondary | ICD-10-CM | POA: Insufficient documentation

## 2022-05-21 DIAGNOSIS — F01A0 Vascular dementia, mild, without behavioral disturbance, psychotic disturbance, mood disturbance, and anxiety: Secondary | ICD-10-CM | POA: Diagnosis present

## 2022-05-21 DIAGNOSIS — E1142 Type 2 diabetes mellitus with diabetic polyneuropathy: Secondary | ICD-10-CM | POA: Insufficient documentation

## 2022-05-21 DIAGNOSIS — Z794 Long term (current) use of insulin: Secondary | ICD-10-CM | POA: Insufficient documentation

## 2022-05-21 DIAGNOSIS — J1282 Pneumonia due to coronavirus disease 2019: Secondary | ICD-10-CM | POA: Diagnosis not present

## 2022-05-21 DIAGNOSIS — E1122 Type 2 diabetes mellitus with diabetic chronic kidney disease: Secondary | ICD-10-CM | POA: Insufficient documentation

## 2022-05-21 DIAGNOSIS — I5042 Chronic combined systolic (congestive) and diastolic (congestive) heart failure: Secondary | ICD-10-CM | POA: Insufficient documentation

## 2022-05-21 DIAGNOSIS — J9811 Atelectasis: Secondary | ICD-10-CM | POA: Diagnosis not present

## 2022-05-21 LAB — COMPREHENSIVE METABOLIC PANEL
ALT: 19 U/L (ref 0–44)
AST: 23 U/L (ref 15–41)
Albumin: 3.6 g/dL (ref 3.5–5.0)
Alkaline Phosphatase: 61 U/L (ref 38–126)
Anion gap: 10 (ref 5–15)
BUN: 44 mg/dL — ABNORMAL HIGH (ref 8–23)
CO2: 23 mmol/L (ref 22–32)
Calcium: 9.2 mg/dL (ref 8.9–10.3)
Chloride: 105 mmol/L (ref 98–111)
Creatinine, Ser: 2.26 mg/dL — ABNORMAL HIGH (ref 0.61–1.24)
GFR, Estimated: 27 mL/min — ABNORMAL LOW (ref >60)
Glucose, Bld: 103 mg/dL — ABNORMAL HIGH (ref 70–99)
Potassium: 4.6 mmol/L (ref 3.5–5.1)
Sodium: 138 mmol/L (ref 135–145)
Total Bilirubin: 1.1 mg/dL (ref 0.3–1.2)
Total Protein: 7 g/dL (ref 6.5–8.1)

## 2022-05-21 LAB — CBC WITH DIFFERENTIAL/PLATELET
Abs Immature Granulocytes: 0.03 10*3/uL (ref 0.00–0.07)
Basophils Absolute: 0 10*3/uL (ref 0.0–0.1)
Basophils Relative: 0 %
Eosinophils Absolute: 0.1 10*3/uL (ref 0.0–0.5)
Eosinophils Relative: 1 %
HCT: 48 % (ref 39.0–52.0)
Hemoglobin: 15.4 g/dL (ref 13.0–17.0)
Immature Granulocytes: 0 %
Lymphocytes Relative: 12 %
Lymphs Abs: 1.4 10*3/uL (ref 0.7–4.0)
MCH: 29.9 pg (ref 26.0–34.0)
MCHC: 32.1 g/dL (ref 30.0–36.0)
MCV: 93.2 fL (ref 80.0–100.0)
Monocytes Absolute: 1.3 10*3/uL — ABNORMAL HIGH (ref 0.1–1.0)
Monocytes Relative: 11 %
Neutro Abs: 8.8 10*3/uL — ABNORMAL HIGH (ref 1.7–7.7)
Neutrophils Relative %: 76 %
Platelets: 152 10*3/uL (ref 150–400)
RBC: 5.15 MIL/uL (ref 4.22–5.81)
RDW: 14.6 % (ref 11.5–15.5)
WBC: 11.6 10*3/uL — ABNORMAL HIGH (ref 4.0–10.5)
nRBC: 0 % (ref 0.0–0.2)

## 2022-05-21 LAB — RESP PANEL BY RT-PCR (RSV, FLU A&B, COVID)  RVPGX2
Influenza A by PCR: NEGATIVE
Influenza B by PCR: NEGATIVE
Resp Syncytial Virus by PCR: NEGATIVE
SARS Coronavirus 2 by RT PCR: POSITIVE — AB

## 2022-05-21 LAB — TROPONIN I (HIGH SENSITIVITY)
Troponin I (High Sensitivity): 36 ng/L — ABNORMAL HIGH (ref <18)
Troponin I (High Sensitivity): 33 ng/L — ABNORMAL HIGH (ref <18)

## 2022-05-21 LAB — CBG MONITORING, ED: Glucose-Capillary: 112 mg/dL — ABNORMAL HIGH (ref 70–99)

## 2022-05-21 LAB — BRAIN NATRIURETIC PEPTIDE: B Natriuretic Peptide: 94.6 pg/mL (ref 0.0–100.0)

## 2022-05-21 LAB — LACTIC ACID, PLASMA
Lactic Acid, Venous: 1.2 mmol/L (ref 0.5–1.9)
Lactic Acid, Venous: 1.3 mmol/L (ref 0.5–1.9)

## 2022-05-21 LAB — GLUCOSE, CAPILLARY
Glucose-Capillary: 78 mg/dL (ref 70–99)
Glucose-Capillary: 175 mg/dL — ABNORMAL HIGH (ref 70–99)

## 2022-05-21 LAB — D-DIMER, QUANTITATIVE: D-Dimer, Quant: 0.85 ug/mL-FEU — ABNORMAL HIGH (ref 0.00–0.50)

## 2022-05-21 MED ORDER — ONDANSETRON HCL 4 MG PO TABS: 4.0000 mg | ORAL_TABLET | Freq: Four times a day (QID) | ORAL | Status: DC | PRN

## 2022-05-21 MED ORDER — INSULIN GLARGINE-YFGN 100 UNIT/ML ~~LOC~~ SOLN
15.0000 [IU] | Freq: Every day | SUBCUTANEOUS | Status: DC
  Administered 2022-05-21: 15 [IU] via SUBCUTANEOUS
  Filled 2022-05-21 (×2): qty 0.15

## 2022-05-21 MED ORDER — LEVOTHYROXINE SODIUM 25 MCG PO TABS
25.0000 ug | ORAL_TABLET | Freq: Every day | ORAL | Status: DC
  Administered 2022-05-22: 25 ug via ORAL
  Filled 2022-05-21: qty 1

## 2022-05-21 MED ORDER — SENNOSIDES-DOCUSATE SODIUM 8.6-50 MG PO TABS: 1.0000 | ORAL_TABLET | Freq: Every evening | ORAL | Status: DC | PRN

## 2022-05-21 MED ORDER — FAMOTIDINE 20 MG PO TABS: 40.0000 mg | ORAL_TABLET | Freq: Every evening | ORAL | Status: DC

## 2022-05-21 MED ORDER — INSULIN ASPART 100 UNIT/ML IJ SOLN
0.0000 [IU] | Freq: Every day | INTRAMUSCULAR | Status: DC
  Filled 2022-05-21: qty 0.05

## 2022-05-21 MED ORDER — METOPROLOL SUCCINATE ER 25 MG PO TB24
25.0000 mg | ORAL_TABLET | Freq: Every evening | ORAL | Status: DC
  Administered 2022-05-21: 25 mg via ORAL
  Filled 2022-05-21: qty 1

## 2022-05-21 MED ORDER — DEXAMETHASONE SODIUM PHOSPHATE 10 MG/ML IJ SOLN
6.0000 mg | INTRAMUSCULAR | Status: DC
  Administered 2022-05-22: 6 mg via INTRAVENOUS
  Filled 2022-05-21: qty 0.6

## 2022-05-21 MED ORDER — INSULIN ASPART 100 UNIT/ML IJ SOLN
0.0000 [IU] | Freq: Three times a day (TID) | INTRAMUSCULAR | Status: DC
  Administered 2022-05-22: 2 [IU] via SUBCUTANEOUS
  Filled 2022-05-21: qty 0.15

## 2022-05-21 MED ORDER — ONDANSETRON HCL 4 MG/2ML IJ SOLN: 4.0000 mg | Freq: Four times a day (QID) | INTRAMUSCULAR | Status: DC | PRN

## 2022-05-21 MED ORDER — HEPARIN SODIUM (PORCINE) 5000 UNIT/ML IJ SOLN
5000.0000 [IU] | Freq: Three times a day (TID) | INTRAMUSCULAR | Status: DC
  Administered 2022-05-21 – 2022-05-22 (×2): 5000 [IU] via SUBCUTANEOUS
  Filled 2022-05-21 (×2): qty 1

## 2022-05-21 MED ORDER — ACETAMINOPHEN 325 MG PO TABS: 650.0000 mg | ORAL_TABLET | Freq: Four times a day (QID) | ORAL | Status: DC | PRN

## 2022-05-21 MED ORDER — MEMANTINE HCL 5 MG PO TABS
10.0000 mg | ORAL_TABLET | Freq: Two times a day (BID) | ORAL | Status: DC
  Administered 2022-05-21 – 2022-05-22 (×2): 10 mg via ORAL
  Filled 2022-05-21 (×2): qty 2

## 2022-05-21 MED ORDER — LACTATED RINGERS IV BOLUS: 1000.0000 mL | Freq: Once | INTRAVENOUS | Status: DC

## 2022-05-21 MED ORDER — NITROGLYCERIN 0.4 MG SL SUBL: 0.4000 mg | SUBLINGUAL_TABLET | SUBLINGUAL | Status: DC | PRN

## 2022-05-21 MED ORDER — INSULIN ASPART 100 UNIT/ML IJ SOLN
6.0000 [IU] | Freq: Three times a day (TID) | INTRAMUSCULAR | Status: DC
  Administered 2022-05-22 (×2): 6 [IU] via SUBCUTANEOUS
  Filled 2022-05-21: qty 0.06

## 2022-05-21 MED ORDER — CLOPIDOGREL BISULFATE 75 MG PO TABS
75.0000 mg | ORAL_TABLET | Freq: Every evening | ORAL | Status: DC
  Administered 2022-05-21: 75 mg via ORAL
  Filled 2022-05-21 (×2): qty 1

## 2022-05-21 MED ORDER — LACTATED RINGERS IV BOLUS
500.0000 mL | Freq: Once | INTRAVENOUS | Status: AC
  Administered 2022-05-21: 500 mL via INTRAVENOUS

## 2022-05-21 MED ORDER — SODIUM CHLORIDE 0.9 % IV BOLUS
1000.0000 mL | Freq: Once | INTRAVENOUS | Status: AC
  Administered 2022-05-21: 1000 mL via INTRAVENOUS

## 2022-05-21 MED ORDER — GUAIFENESIN-DM 100-10 MG/5ML PO SYRP: 10.0000 mL | ORAL_SOLUTION | ORAL | Status: DC | PRN

## 2022-05-21 MED ORDER — ENOXAPARIN SODIUM 40 MG/0.4ML IJ SOSY: 40.0000 mg | PREFILLED_SYRINGE | INTRAMUSCULAR | Status: DC

## 2022-05-21 MED ORDER — ASPIRIN 81 MG PO CHEW
81.0000 mg | CHEWABLE_TABLET | Freq: Every evening | ORAL | Status: DC
  Administered 2022-05-21: 81 mg via ORAL
  Filled 2022-05-21: qty 1

## 2022-05-21 MED ORDER — ORAL CARE MOUTH RINSE: 15.0000 mL | OROMUCOSAL | Status: DC | PRN

## 2022-05-21 MED ORDER — INSULIN ASPART 100 UNIT/ML IJ SOLN
0.0000 [IU] | Freq: Three times a day (TID) | INTRAMUSCULAR | Status: DC
  Filled 2022-05-21: qty 0.15

## 2022-05-21 MED ORDER — ROSUVASTATIN CALCIUM 20 MG PO TABS
40.0000 mg | ORAL_TABLET | Freq: Every day | ORAL | Status: DC
  Administered 2022-05-21: 40 mg via ORAL
  Filled 2022-05-21: qty 2

## 2022-05-21 NOTE — H&P (Signed)
History and Physical    SANTHOSH GULINO JHE:[id number] DOB: 1931-06-18 DOA: 05/21/2022  PCP: [name], MD  Patient coming from: Home  Chief Complaint: Low BP  HPI: [name] is a 88 y.o. male with medical history significant of coronary artery disease, ischemic cardiomyopathy with pacemaker placement with pacemaker placement, carotid stenosis on the left, CVA, CKD stage 4, diabetes, hypothyroidism, OSA presented to the ER for low blood pressure.  Over the last week, he has been dealing with an upper respiratory infection.  Both he and his wife tested positive for COVID at home.  He has slight shortness of breath and a cough in addition to runny/stuffy nose, sore throat, and ear pressure.  He denies any shortness of breath, chest pain, nausea, vomiting, diarrhea, abdominal pain, or calf pain.  ED Course: He tested positive for COVID in the ER.  D-dimer elevated, unable to get CTA chest due to renal function.  VQ scan ordered.  Decadron was initiated.  His oxygen saturations dropped to 83% on room air and he was placed on 2 L of oxygen via nasal cannula.  Chest x-ray showed a small amount of bibasilar consolidation.  Poor inspiratory effort.  Troponins mildly elevated at 36 and 33 respectively.  Review of Systems: As per HPI otherwise 10 point review of systems negative.   Past Medical History:  Diagnosis Date   Abnormal Doppler ultrasound of carotid artery 12/16/2012   mildly abnormal doppler . no prior studies to compare to. follow up studies are recommended when clinically indicated.   Acute respiratory failure with hypoxia 11/2018   Blepharitis    CAD (coronary artery disease) 07/24/2013   Previously placed drug-eluting stents in the LAD and RCA in 2005, low risk perfusion study December 2011 in January 2014   Cardiomyopathy, ischemic 07/24/2013   EF 45-50% echo March 2014   Carotid stenosis, left 11/2018   on left 60%   Cerebrovascular disease 03/24/2022   CHB (complete  heart block) 08/27/2019   Chronic combined systolic and diastolic heart failure 18/56/3149   CKD (chronic kidney disease) stage 3, GFR 30-59 ml/min 07/24/2013   Diabetic hypoglycemia 05/02/2021   Diabetic polyneuropathy 05/02/2021   Gastric ulcer    GI bleed    History of cancer    History of echocardiogram 08/15/2012   EF  45-50 % very difficult study, severe LVH, pacemaker, trace TR,mildly enlarged RV,; aorta mildly calcified   History of stress test 06/06/2012   low risk scan. fixed inferior defect consistant with prior infarction with moderatly reduced EF. no appreciable evidence of ischemia. 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 DDDR ; this is the third generator this pt. has had   Pain due to onychomycosis of toenails of both feet 11/13/2018   Pain in joint of right shoulder 09/15/2021   Second degree AV block 03/09/2014   Sensorineural hearing loss (SNHL) of both ears 04/08/2018   Shingles    Sinus node dysfunction 03/09/2014   TIA (transient ischemic attack) 11/2018   vs stroke not seen on CT   Tinnitus, bilateral 04/08/2018   Type II diabetes mellitus     Past Surgical History:  Procedure Laterality Date   CARDIAC CATHETERIZATION  12-29-2003   rt. coronary angiography was done with a 6 french 4 cm taper coronary cath. This demonstrated  approximately 70 to 75% mildly segmental stenosis in the mid RCA before the acute margin           CARDIAC CATHETERIZATION  11-19-2003   diagnostic cath,, pt. to get second cath to have stent placed   cardiac stents     CATARACT EXTRACTION     KNEE SURGERY Right    PPM GENERATOR CHANGEOUT N/A 04/07/2019   Procedure: PPM GENERATOR CHANGEOUT;  Surgeon: Sanda Klein, MD;   Location: [facility];  Service: Cardiovascular;  Laterality: N/A;   PROSTATE BIOPSY     prostate seed implants     SPINE SURGERY     WISDOM TOOTH EXTRACTION       reports that he has never smoked. He has quit using smokeless tobacco. He reports current alcohol use. He reports that he does not use drugs.  No Known Allergies  Family History  Problem Relation Age of Onset   Memory loss Mother        with very advanced age   Heart disease Father     Prior to Admission medications   Medication Sig Start Date End Date Taking? Authorizing Provider  acetaminophen (TYLENOL) 500 MG tablet Take 500 mg by mouth 2 (two) times daily.    [provider]  aspirin 81 MG tablet Take 81 mg by mouth every evening.     [provider]  bisacodyl (DULCOLAX) 5 MG EC tablet 3 tablets as needed    [provider]  clopidogrel (PLAVIX) 75 MG tablet Take 1 tablet by mouth once daily 04/10/22   Croitoru, Mihai, MD  Coenzyme Q10 (COQ10) 150 MG CAPS Take 150 mg by mouth every evening.    [provider]  famotidine (PEPCID) 40 MG tablet Take 40 mg by mouth every evening.    [provider]  insulin NPH-regular Human (70-30) 100 UNIT/ML injection Inject 50 Units into the skin 2 (two) times daily with a meal. Patient taking differently: Inject 30-50 Units into the skin 2 (two) times daily with a meal. Inject 50 units in the morning and 30 units at night 05/02/21   Gonfa, Taye T, MD  levothyroxine (SYNTHROID, LEVOTHROID) 25 MCG tablet Take 25 mcg by mouth daily.  02/09/14   [provider]  memantine (NAMENDA) 10 MG tablet Take 1 tablet (10 mg total) by mouth 2 (two) times daily. 07/21/21   Rondel Jumbo, PA-C  metoprolol succinate (TOPROL-XL) 25 MG 24 hr tablet Take 1 tablet (25 mg total) by mouth daily. Patient taking differently: Take 25 mg by mouth every evening. 10/04/21   Croitoru, Mihai, MD  Multiple Vitamin (RENAL MULTIVITAMIN/ZINC PO) Take 1 tablet  by mouth every evening.    [provider]  nitroGLYCERIN (NITROSTAT) 0.4 MG SL tablet DISSOLVE ONE TABLET UNDER THE TONGUE EVERY 5 MINUTES AS NEEDED FOR CHEST PAIN. 03/13/22   Croitoru, Mihai, MD  OVER THE COUNTER MEDICATION Take 3 drops by mouth every evening. Cortexi drops    [provider]  OVER THE COUNTER MEDICATION Take 1 tablet by mouth every evening. cocoavia    [provider]  polyethylene glycol (MIRALAX / GLYCOLAX) 17 g packet Take 17 g by mouth daily. 08/25/21   Charlesetta Shanks, MD  rosuvastatin (CRESTOR) 40 MG tablet Take 1 tablet by mouth once daily 04/10/22   Croitoru, Dani Gobble, MD    Physical Exam: Vitals:  05/21/22 1500 05/21/22 1513 05/21/22 1600 05/21/22 1614  BP:  136/73 (!) 153/73   Pulse: 73 72 73 78  Resp: 18 (!) 22 17 (!) 23  Temp:    98.7 F (37.1 C)  TempSrc:    Oral  SpO2: 90% (!) 83% 96% 96%    Constitutional: NAD, calm, comfortable Eyes: PERRL, lids and conjunctivae normal ENMT: Mucous membranes are moist. Neck: normal, supple, no masses, no thyromegaly Respiratory: clear to auscultation bilaterally, no wheezing, no crackles. Normal respiratory effort. No accessory muscle use.  Cardiovascular: RRR.  2+ pitting bilateral lower extremity edema tapering at the proximal third of the tibia.  Abdomen: no tenderness, no masses palpated, moderately distended. No hepatosplenomegaly. Bowel sounds positive.  Musculoskeletal: no clubbing / cyanosis. No joint deformity upper and lower extremities. Good ROM, no contractures.  Skin: no rashes, lesions, ulcers on exposed skin surfaces.  Neurologic: CN 2-12 grossly intact. Sensation intact, no cerebellar signs grip strength is adequate. Psychiatric: Normal judgment and insight. Alert and oriented x 3. Normal mood.   Labs on Admission: I have personally reviewed following labs and imaging studies  CBC: Recent Labs  Lab 05/21/22 1233  WBC 11.6*  NEUTROABS 8.8*  HGB 15.4  HCT 48.0  MCV 93.2   PLT 315   Basic Metabolic Panel: Recent Labs  Lab 05/21/22 1233  NA 138  K 4.6  CL 105  CO2 23  GLUCOSE 103*  BUN 44*  CREATININE 2.26*  CALCIUM 9.2   Liver Function Tests: Recent Labs  Lab 05/21/22 1233  AST 23  ALT 19  ALKPHOS 61  BILITOT 1.1  PROT 7.0  ALBUMIN 3.6   CBG: Recent Labs  Lab 05/21/22 1256  GLUCAP 112*   Urine analysis:    Component Value Date/Time   COLORURINE YELLOW 03/04/2022 1415   APPEARANCEUR HAZY (A) 03/04/2022 1415   LABSPEC 1.015 03/04/2022 1415   PHURINE 6.0 03/04/2022 1415   GLUCOSEU 500 (A) 03/04/2022 1415   HGBUR NEGATIVE 03/04/2022 1415   BILIRUBINUR NEGATIVE 03/04/2022 1415   KETONESUR NEGATIVE 03/04/2022 1415   PROTEINUR TRACE (A) 03/04/2022 1415   UROBILINOGEN 0.2 06/29/2010 0930   NITRITE NEGATIVE 03/04/2022 1415   LEUKOCYTESUR NEGATIVE 03/04/2022 1415   Recent Results (from the past 240 hour(s))  Resp panel by RT-PCR (RSV, Flu A&B, Covid) Anterior Nasal Swab     Status: Abnormal   Collection Time: 05/21/22  1:59 PM   Specimen: Anterior Nasal Swab  Result Value Ref Range Status   SARS Coronavirus 2 by RT PCR POSITIVE (A) NEGATIVE Final    Comment: (NOTE) SARS-CoV-2 target nucleic acids are DETECTED.  The SARS-CoV-2 RNA is generally detectable in upper respiratory specimens during the acute phase of infection. Positive results are indicative of the presence of the identified virus, but do not rule out bacterial infection or co-infection with other pathogens not detected by the test. Clinical correlation with patient history and other diagnostic information is necessary to determine patient infection status. The expected result is Negative.  Fact Sheet for Patients: EntrepreneurPulse.com.au  Fact Sheet for Healthcare Providers: IncredibleEmployment.be  This test is not yet approved or cleared by the [place] FDA and  has been authorized for detection and/or diagnosis of  SARS-CoV-2 by FDA under an Emergency Use Authorization (EUA).  This EUA will remain in effect (meaning this test can be used) for the duration of  the COVID-19 declaration under Section 564(b)(1) of the A ct, 21 U.S.C. section 360bbb-3(b)(1), unless the authorization is terminated  or revoked sooner.     Influenza A by PCR NEGATIVE NEGATIVE Final   Influenza B by PCR NEGATIVE NEGATIVE Final    Comment: (NOTE) The Xpert Xpress SARS-CoV-2/FLU/RSV plus assay is intended as an aid in the diagnosis of influenza from Nasopharyngeal swab specimens and should not be used as a sole basis for treatment. Nasal washings and aspirates are unacceptable for Xpert Xpress SARS-CoV-2/FLU/RSV testing.  Fact Sheet for Patients: EntrepreneurPulse.com.au  Fact Sheet for Healthcare Providers: IncredibleEmployment.be  This test is not yet approved or cleared by the [place] FDA and has been authorized for detection and/or diagnosis of SARS-CoV-2 by FDA under an Emergency Use Authorization (EUA). This EUA will remain in effect (meaning this test can be used) for the duration of the COVID-19 declaration under Section 564(b)(1) of the Act, 21 U.S.C. section 360bbb-3(b)(1), unless the authorization is terminated or revoked.     Resp Syncytial Virus by PCR NEGATIVE NEGATIVE Final    Comment: (NOTE) Fact Sheet for Patients: EntrepreneurPulse.com.au  Fact Sheet for Healthcare Providers: IncredibleEmployment.be  This test is not yet approved or cleared by the [place] FDA and has been authorized for detection and/or diagnosis of SARS-CoV-2 by FDA under an Emergency Use Authorization (EUA). This EUA will remain in effect (meaning this test can be used) for the duration of the COVID-19 declaration under Section 564(b)(1) of the Act, 21 U.S.C. section 360bbb-3(b)(1), unless the authorization is terminated  or revoked.  Performed at [facility], [place] [address], [place], [place] [address]      Radiological Exams on Admission: CT Head Wo Contrast  Result Date: 05/21/2022 CLINICAL DATA:  Mental status change, unknown cause EXAM: CT HEAD WITHOUT CONTRAST TECHNIQUE: Contiguous axial images were obtained from the base of the skull through the vertex without intravenous contrast. RADIATION DOSE REDUCTION: This exam was performed according to the departmental dose-optimization program which includes automated exposure control, adjustment of the mA and/or kV according to patient size and/or use of iterative reconstruction technique. COMPARISON:  03/04/2022 FINDINGS: Brain: No evidence of acute infarction, hemorrhage, hydrocephalus, extra-axial collection or mass lesion/mass effect. Patchy low-density changes within the periventricular and subcortical white matter compatible with chronic microvascular ischemic change. Mild diffuse cerebral volume loss. Vascular: Atherosclerotic calcifications involving the large vessels of the skull base. No unexpected hyperdense vessel. Skull: Normal. Negative for fracture or focal lesion. Sinuses/Orbits: Mucosal thickening of the bilateral maxillary sinuses and ethmoid air cells. Other: None. IMPRESSION: 1. No acute intracranial findings. 2. Chronic microvascular ischemic change and cerebral volume loss. 3. Paranasal sinus disease. Electronically Signed   By: Davina Poke D.O.   On: 05/21/2022 13:48   DG Chest 2 View  Result Date: 05/21/2022 CLINICAL DATA:  COVID positive EXAM: CHEST - 2 VIEW COMPARISON:  03/04/2022 FINDINGS: Cardiac silhouette is prominent. There is bibasilar linear subsegmental atelectasis. No pneumothorax. Normal pulmonary vasculature. Calcified aorta. Right-sided pacer in place. IMPRESSION: Enlarged cardiac silhouette. Minimal bibasilar subsegmental atelectasis or consolidation. Electronically Signed   By: Sammie Bench M.D.    On: 05/21/2022 13:41    EKG: Independently reviewed.   Assessment/Plan Principal Problem:   Acute respiratory failure ([place]) due to pneumonia due to COVID-19 virus  Supplemental oxygen as needed to maintain saturations greater than 90%, currently on 2 L nasal cannula  Outside of the window for antiviral therapy  Decadron 6 mg daily for 10 days, first dose on 12/31  Contact precautions  Elevated D-dimer in ER, could be related to this but VQ  scan was ordered  Active Problems:   Cardiomyopathy, ischemic  Pacemaker in   Continue Toprol XL 25 mg daily    CAD (coronary artery disease)   Continue aspirin 81 mg daily, Plavix 75 mg daily and Crestor 40 mg daily  Heart healthy diet  Sublingual nitro 0.4 mg every 5 minutes as needed for chest pain    Hypothyroidism  Continue levothyroxine 25 mcg daily    Type II diabetes mellitus  Hold home medications  Semglee 15 units nightly, may need to uptitrate this  6 units of NovoLog with meals with a regular sliding scale    Mild vascular dementia  Continue Namenda 10 mg twice daily    CKD (chronic kidney disease) stage 4, GFR 15-29 ml/min (HCC)  Monitor above  DVT prophylaxis: Heparin Code Status: Full Family Communication: His son Jenny Reichmann was bedside Disposition Plan: Home Consults called: None Admission status: Inpatient  Severity of Illness: The appropriate patient status for this patient is INPATIENT. Inpatient status is judged to be reasonable and necessary in order to provide the required intensity of service to ensure the patient's safety. The patient's presenting symptoms, physical exam findings, and initial radiographic and laboratory data in the context of their chronic comorbidities is felt to place them at high risk for further clinical deterioration. Furthermore, it is not anticipated that the patient will be medically stable for discharge from the hospital within 2 midnights of admission.   * I certify that at the point of  admission it is my clinical judgment that the patient will require inpatient hospital care spanning beyond 2 midnights from the point of admission due to high intensity of service, high risk for further deterioration and high frequency of surveillance required.*  Shelda Pal, DO [facility] [email or web address] 05/21/2022, 4:37 PM

## 2022-05-21 NOTE — ED Notes (Signed)
Pt O2 sat was 85%-88%, placed pt on nasal cannula at 2lpm at this time

## 2022-05-21 NOTE — ED Provider Notes (Signed)
[place] DEPT Provider Note   CSN: [id number] Arrival date & time: 05/21/22  1102     History  Chief Complaint  Patient presents with   Covid Positive   Hypotension    [name] is a 88 y.o. male.  HPI   88 year old male with medical history significant for heart block with a [employer] Jude pacemaker in place, GI bleed, TIA, CAD, OSA, hypothyroidism who presents emergency department with multiple complaints.  The patient states that he was symptomatic with COVID for the last week and tested positive for COVID this morning.  He may have been feeling generalized malaise over the last few days.  Endorses sore throat.  This morning he woke up and walked to the kitchen and felt near syncopal and lightheaded.  He had no episode of syncope.  His blood pressure was in the 99B systolic when it was checked at home.  He denies any fevers, chills she denies any chest pain, shortness of breath, palpitations.  He denies any nausea, vomiting, diarrhea, abdominal pain.  He went to the emergency department and while waiting in the waiting room he had an episode of blurred vision and difficulty reading a sign on the wall.  He states that symptoms are similar to his prior TIA.  Symptoms have since resolved.  He states that his pacemaker is not MRI safe.  Home Medications Prior to Admission medications   Medication Sig Start Date End Date Taking? Authorizing Provider  acetaminophen (TYLENOL) 500 MG tablet Take 500 mg by mouth daily.   Yes [provider]  aspirin 81 MG tablet Take 81 mg by mouth every evening.    Yes [provider]  bisacodyl (DULCOLAX) 5 MG EC tablet Take 5 mg by mouth daily as needed for severe constipation.   Yes [provider]  clopidogrel (PLAVIX) 75 MG tablet Take 1 tablet by mouth once daily Patient taking differently: Take 75 mg by mouth every evening. 04/10/22  Yes Croitoru, Mihai, MD  Coenzyme Q10 (COQ10) 150 MG CAPS  Take 150 mg by mouth every evening.   Yes [provider]  docusate sodium (COLACE) 100 MG capsule Take 100 mg by mouth daily.   Yes [provider]  levothyroxine (SYNTHROID, LEVOTHROID) 25 MCG tablet Take 25 mcg by mouth daily.  02/09/14  Yes [provider]  memantine (NAMENDA) 10 MG tablet Take 1 tablet (10 mg total) by mouth 2 (two) times daily. 07/21/21  Yes Rondel Jumbo, PA-C  metoprolol succinate (TOPROL-XL) 25 MG 24 hr tablet Take 1 tablet (25 mg total) by mouth daily. Patient taking differently: Take 25 mg by mouth every evening. 10/04/21  Yes Croitoru, Mihai, MD  Multiple Vitamin (RENAL MULTIVITAMIN/ZINC PO) Take 1 tablet by mouth every evening.   Yes [provider]  nitroGLYCERIN (NITROSTAT) 0.4 MG SL tablet DISSOLVE ONE TABLET UNDER THE TONGUE EVERY 5 MINUTES AS NEEDED FOR CHEST PAIN. Patient taking differently: Place 0.4 mg under the tongue every 5 (five) minutes as needed for chest pain. 03/13/22  Yes Croitoru, Mihai, MD  NOVOLIN 70/30 KWIKPEN (70-30) 100 UNIT/ML KwikPen Inject 18-55 Units into the skin 2 (two) times daily. Takes 55 units in the morning and 18 units at night. 04/25/22  Yes [provider]  OVER THE COUNTER MEDICATION Take 1 tablet by mouth every evening. cocoavia   Yes [provider]  polyethylene glycol (MIRALAX / GLYCOLAX) 17 g packet Take 17 g by mouth daily. 08/25/21  Yes  Charlesetta Shanks, MD  rosuvastatin (CRESTOR) 40 MG tablet Take 1 tablet by mouth once daily 04/10/22  Yes Croitoru, Mihai, MD  insulin NPH-regular Human (70-30) 100 UNIT/ML injection Inject 50 Units into the skin 2 (two) times daily with a meal. Patient not taking: Reported on 05/21/2022 05/02/21   Mercy Riding, MD      Allergies    Patient has no known allergies.    Review of Systems   Review of Systems  All other systems reviewed and are negative.   Physical Exam Updated Vital Signs BP 115/71 (BP Location: Left Arm)   Pulse 77    Temp 97.6 F (36.4 C)   Resp 18   Ht '5\' 7"'$  (1.702 m)   Wt 97.8 kg   SpO2 94%   BMI 33.77 kg/m  Physical Exam Vitals and nursing note reviewed.  Constitutional:      General: He is not in acute distress. HENT:     Head: Normocephalic and atraumatic.  Eyes:     Conjunctiva/sclera: Conjunctivae normal.     Pupils: Pupils are equal, round, and reactive to light.  Cardiovascular:     Rate and Rhythm: Normal rate and regular rhythm.  Pulmonary:     Effort: Pulmonary effort is normal. No respiratory distress.  Abdominal:     General: There is no distension.     Tenderness: There is no guarding.  Musculoskeletal:        General: No deformity or signs of injury.     Cervical back: Neck supple.  Skin:    Findings: No lesion or rash.  Neurological:     General: No focal deficit present.     Mental Status: He is alert. Mental status is at baseline.     Comments: MENTAL STATUS EXAM:    Orientation: Alert and oriented to person, place and time.  Memory: Cooperative, follows commands well.  Language: Speech is clear and language is normal.   CRANIAL NERVES:    CN 2 (Optic): Visual fields intact to confrontation.  CN 3,4,6 (EOM): Pupils equal and reactive to light. Full extraocular eye movement without nystagmus.  CN 5 (Trigeminal): Facial sensation is normal, no weakness of masticatory muscles.  CN 7 (Facial): No facial weakness or asymmetry.  CN 8 (Auditory): Auditory acuity grossly normal.  CN 9,10 (Glossophar): The uvula is midline, the palate elevates symmetrically.  CN 11 (spinal access): Normal sternocleidomastoid and trapezius strength.  CN 12 (Hypoglossal): The tongue is midline. No atrophy or fasciculations.Marland Kitchen   MOTOR:  Muscle Strength: 5/5RUE, 5/5LUE, 5/5RLE, 5/5LLE.   COORDINATION:   Intact finger-to-nose, no tremor.   SENSATION:   Intact to light touch all four extremities.        ED Results / Procedures / Treatments   Labs (all labs ordered are listed, but  only abnormal results are displayed) Labs Reviewed  RESP PANEL BY RT-PCR (RSV, FLU A&B, COVID)  RVPGX2 - Abnormal; Notable for the following components:      Result Value   SARS Coronavirus 2 by RT PCR POSITIVE (*)    All other components within normal limits  CBC WITH DIFFERENTIAL/PLATELET - Abnormal; Notable for the following components:   WBC 11.6 (*)    Neutro Abs 8.8 (*)    Monocytes Absolute 1.3 (*)    All other components within normal limits  COMPREHENSIVE METABOLIC PANEL - Abnormal; Notable for the following components:   Glucose, Bld 103 (*)    BUN 44 (*)    Creatinine,  Ser 2.26 (*)    GFR, Estimated 27 (*)    All other components within normal limits  D-DIMER, QUANTITATIVE - Abnormal; Notable for the following components:   D-Dimer, Quant 0.85 (*)    All other components within normal limits  GLUCOSE, CAPILLARY - Abnormal; Notable for the following components:   Glucose-Capillary 175 (*)    All other components within normal limits  CBG MONITORING, ED - Abnormal; Notable for the following components:   Glucose-Capillary 112 (*)    All other components within normal limits  TROPONIN I (HIGH SENSITIVITY) - Abnormal; Notable for the following components:   Troponin I (High Sensitivity) 36 (*)    All other components within normal limits  TROPONIN I (HIGH SENSITIVITY) - Abnormal; Notable for the following components:   Troponin I (High Sensitivity) 33 (*)    All other components within normal limits  LACTIC ACID, PLASMA  LACTIC ACID, PLASMA  BRAIN NATRIURETIC PEPTIDE  GLUCOSE, CAPILLARY  URINALYSIS, ROUTINE W REFLEX MICROSCOPIC  BASIC METABOLIC PANEL  CBC    EKG EKG Interpretation  Date/Time:  Sunday May 21 2022 12:48:18 EST Ventricular Rate:  72 PR Interval:  201 QRS Duration: 173 QT Interval:  431 QTC Calculation: 472 R Axis:   -75 Text Interpretation: Paced rhythm Nonspecific IVCD with LAD LVH with secondary repolarization abnormality Inferior  infarct, old Anterolateral infarct, age indeterminate Confirmed by Regan Lemming ([id number]) on 05/21/2022 2:23:03 PM  Radiology CT Head Wo Contrast  Result Date: 05/21/2022 CLINICAL DATA:  Mental status change, unknown cause EXAM: CT HEAD WITHOUT CONTRAST TECHNIQUE: Contiguous axial images were obtained from the base of the skull through the vertex without intravenous contrast. RADIATION DOSE REDUCTION: This exam was performed according to the departmental dose-optimization program which includes automated exposure control, adjustment of the mA and/or kV according to patient size and/or use of iterative reconstruction technique. COMPARISON:  03/04/2022 FINDINGS: Brain: No evidence of acute infarction, hemorrhage, hydrocephalus, extra-axial collection or mass lesion/mass effect. Patchy low-density changes within the periventricular and subcortical white matter compatible with chronic microvascular ischemic change. Mild diffuse cerebral volume loss. Vascular: Atherosclerotic calcifications involving the large vessels of the skull base. No unexpected hyperdense vessel. Skull: Normal. Negative for fracture or focal lesion. Sinuses/Orbits: Mucosal thickening of the bilateral maxillary sinuses and ethmoid air cells. Other: None. IMPRESSION: 1. No acute intracranial findings. 2. Chronic microvascular ischemic change and cerebral volume loss. 3. Paranasal sinus disease. Electronically Signed   By: Davina Poke D.O.   On: 05/21/2022 13:48   DG Chest 2 View  Result Date: 05/21/2022 CLINICAL DATA:  COVID positive EXAM: CHEST - 2 VIEW COMPARISON:  03/04/2022 FINDINGS: Cardiac silhouette is prominent. There is bibasilar linear subsegmental atelectasis. No pneumothorax. Normal pulmonary vasculature. Calcified aorta. Right-sided pacer in place. IMPRESSION: Enlarged cardiac silhouette. Minimal bibasilar subsegmental atelectasis or consolidation. Electronically Signed   By: Sammie Bench M.D.   On: 05/21/2022 13:41     Procedures Procedures    Medications Ordered in ED Medications  dexamethasone (DECADRON) injection 6 mg (has no administration in time range)  guaiFENesin-dextromethorphan (ROBITUSSIN DM) 100-10 MG/5ML syrup 10 mL (has no administration in time range)  acetaminophen (TYLENOL) tablet 650 mg (has no administration in time range)  senna-docusate (Senokot-S) tablet 1 tablet (has no administration in time range)  ondansetron (ZOFRAN) tablet 4 mg (has no administration in time range)    Or  ondansetron (ZOFRAN) injection 4 mg (has no administration in time range)  heparin injection 5,000 Units (5,000 Units Subcutaneous  Given 05/21/22 2202)  aspirin chewable tablet 81 mg (81 mg Oral Given 05/21/22 2200)  clopidogrel (PLAVIX) tablet 75 mg (75 mg Oral Given 05/21/22 2201)  levothyroxine (SYNTHROID) tablet 25 mcg (has no administration in time range)  memantine (NAMENDA) tablet 10 mg (10 mg Oral Given 05/21/22 2201)  metoprolol succinate (TOPROL-XL) 24 hr tablet 25 mg (25 mg Oral Given 05/21/22 2200)  nitroGLYCERIN (NITROSTAT) SL tablet 0.4 mg (has no administration in time range)  rosuvastatin (CRESTOR) tablet 40 mg (40 mg Oral Given 05/21/22 2201)  insulin glargine-yfgn (SEMGLEE) injection 15 Units (15 Units Subcutaneous Given 05/21/22 2203)  insulin aspart (novoLOG) injection 0-15 Units ( Subcutaneous Not Given 05/21/22 1815)  insulin aspart (novoLOG) injection 0-5 Units ( Subcutaneous Not Given 05/21/22 2202)  insulin aspart (novoLOG) injection 6 Units (6 Units Subcutaneous Not Given 05/21/22 1817)  Oral care mouth rinse (has no administration in time range)  lactated ringers bolus 500 mL (0 mLs Intravenous Stopped 05/21/22 1615)  sodium chloride 0.9 % bolus 1,000 mL ( Intravenous Infusion Verify 05/21/22 1828)    ED Course/ Medical Decision Making/ A&P Clinical Course as of 05/22/22 0003  Sun May 21, 2022  1523 SARS Coronavirus 2 by RT PCR(!): POSITIVE [JL]    Clinical Course User  Index [JL] Regan Lemming, MD                           Medical Decision Making Amount and/or Complexity of Data Reviewed Labs: ordered. Decision-making details documented in ED Course. Radiology: ordered.  Risk Decision regarding hospitalization.    88 year old male with medical history significant for heart block with a [employer] Jude pacemaker in place, GI bleed, TIA, CAD, OSA, hypothyroidism who presents emergency department with multiple complaints.  The patient states that he was symptomatic with COVID for the last week and tested positive for COVID this morning.  He may have been feeling generalized malaise over the last few days.  Endorses sore throat.  This morning he woke up and walked to the kitchen and felt near syncopal and lightheaded.  He had no episode of syncope.  His blood pressure was in the 29J systolic when it was checked at home.  He denies any fevers, chills she denies any chest pain, shortness of breath, palpitations.  He denies any nausea, vomiting, diarrhea, abdominal pain.  He went to the emergency department and while waiting in the waiting room he had an episode of blurred vision and difficulty reading a sign on the wall.  He states that symptoms are similar to his prior TIA.  Symptoms have since resolved.  He states that his pacemaker is not MRI safe.  On arrival, the patient was vitally stable, afebrile, not tachycardic or tachypneic, borderline soft blood pressures, BP 96/61, saturating 96% on room air.  EKG was performed which revealed a ventricular paced rhythm, rate 72, no acute ischemic changes noted.  The patient presents positive for COVID after a syncopal episode today.  Symptoms could be vasovagal versus orthostatic in nature.  Pacemaker interrogation ordered.  A chest x-ray was performed which revealed a small amount of bibasilar consolidation with poor inspiratory effort.  Troponin was initially elevated at 36 and subsequently downtrending to 33.  CMP revealed  an elevated serum creatinine of 2.26, GFR of 27, CBG 112, CBC with a nonspecific leukocytosis to 11.5, D-dimer elevated to 0.85.  Lactic acid was normal at 1.3.  A CT head was performed which revealed no acute  intracranial abnormality.  The patient tested positive for COVID-19.  A D-dimer was collected and was elevated which could be in the result of his COVID diagnosis however cannot rule out PE.  Due to poor renal function, I could not order CTA PE study and therefore VQ scan was ordered.  Due to concern for potential high risk syncope, potential need for PT for consideration for placement, plan will be to admit the patient for observation on cardiac telemetry, plan for VQ scan inpatient, continued symptomatic management of the patient's COVID-19.  Hospitalist medicine was consulted for admission and Dr. Nani Ravens accepted the patient in admission.  The patient remained hemodynamically stable and normotensive throughout his time under my care in the emergency department.        Final Clinical Impression(s) / ED Diagnoses Final diagnoses:  COVID-19  Hypotension, unspecified hypotension type    Rx / DC Orders ED Discharge Orders     None         Regan Lemming, MD 05/22/22 0003

## 2022-05-21 NOTE — ED Provider Triage Note (Addendum)
Emergency Medicine Provider Triage Evaluation Note  [name] , a 88 y.o. male  was evaluated in triage.  Pt complains of weakness. States that he just tested positive for COVID and has felt generally poor over the past few days. States that this morning when he woke up and walked to the kitchen he felt like he was going to pass out. He took his blood pressure and it was in the 80s. He did not loose consciousness. Denies fevers, chills, chest pain, shortness of breath, nausea, vomiting, diarrhea, or abdominal pain. No headache or blurred vision.  Review of Systems  Positive:  Negative:   Physical Exam  BP 96/61 (BP Location: Left Arm)   Pulse 74   Temp 98.6 F (37 C) (Oral)   Resp 16   SpO2 96%  Gen:   Awake, no distress   Resp:  Normal effort  MSK:   Moves extremities without difficulty  Other:    Medical Decision Making  Medically screening exam initiated at 12:34 PM.  Appropriate orders placed.  [name] was informed that the remainder of the evaluation will be completed by another provider, this initial triage assessment does not replace that evaluation, and the importance of remaining in the ED until their evaluation is complete.     Bud Face, PA-C 05/21/22 1236   ADDENDUM  1:21 PM: Patient in the lobby waiting for a room states that he had a brief episode of blurred vision where he could not read words on the wall. States that it lasted a few minutes and has been improving since. He is concerned that he is having a stroke, states he has a history of same. Patient is alert and oriented, able to read words without difficulty and is neurologically intact without focal deficits. Will add on head CT, no code stroke at this time.   Bud Face, PA-C 05/21/22 1321    Bud Face, PA-C 05/21/22 1321

## 2022-05-21 NOTE — ED Triage Notes (Signed)
Pt arrived via POV, c/o dizziness and hypotension. States tested positive for COVID this morning.

## 2022-05-21 NOTE — Telephone Encounter (Signed)
Patient called in reporting progressive URI symptoms with weakness over the past 4-5 days. So weak this morning he could barely stand to go to the bathroom. Last BP was 88/49 while laying bed. Household with sick contacts as well. Advised he should seek evaluation in the ED given low BP and near syncope. He was agreeable and thanked me for call back.

## 2022-05-22 DIAGNOSIS — Z794 Long term (current) use of insulin: Secondary | ICD-10-CM | POA: Diagnosis not present

## 2022-05-22 DIAGNOSIS — Z7902 Long term (current) use of antithrombotics/antiplatelets: Secondary | ICD-10-CM | POA: Diagnosis not present

## 2022-05-22 DIAGNOSIS — Z950 Presence of cardiac pacemaker: Secondary | ICD-10-CM | POA: Diagnosis not present

## 2022-05-22 DIAGNOSIS — F0390 Unspecified dementia without behavioral disturbance: Secondary | ICD-10-CM | POA: Diagnosis not present

## 2022-05-22 DIAGNOSIS — Z79899 Other long term (current) drug therapy: Secondary | ICD-10-CM | POA: Diagnosis not present

## 2022-05-22 DIAGNOSIS — J9600 Acute respiratory failure, unspecified whether with hypoxia or hypercapnia: Secondary | ICD-10-CM | POA: Diagnosis not present

## 2022-05-22 DIAGNOSIS — U071 COVID-19: Secondary | ICD-10-CM | POA: Diagnosis not present

## 2022-05-22 DIAGNOSIS — N184 Chronic kidney disease, stage 4 (severe): Secondary | ICD-10-CM | POA: Diagnosis not present

## 2022-05-22 DIAGNOSIS — I2510 Atherosclerotic heart disease of native coronary artery without angina pectoris: Secondary | ICD-10-CM | POA: Diagnosis not present

## 2022-05-22 DIAGNOSIS — E1142 Type 2 diabetes mellitus with diabetic polyneuropathy: Secondary | ICD-10-CM | POA: Diagnosis not present

## 2022-05-22 DIAGNOSIS — E1122 Type 2 diabetes mellitus with diabetic chronic kidney disease: Secondary | ICD-10-CM | POA: Diagnosis not present

## 2022-05-22 DIAGNOSIS — J1282 Pneumonia due to coronavirus disease 2019: Secondary | ICD-10-CM | POA: Diagnosis not present

## 2022-05-22 DIAGNOSIS — Z87891 Personal history of nicotine dependence: Secondary | ICD-10-CM | POA: Diagnosis not present

## 2022-05-22 DIAGNOSIS — I5042 Chronic combined systolic (congestive) and diastolic (congestive) heart failure: Secondary | ICD-10-CM | POA: Diagnosis not present

## 2022-05-22 DIAGNOSIS — I959 Hypotension, unspecified: Secondary | ICD-10-CM | POA: Diagnosis not present

## 2022-05-22 DIAGNOSIS — Z8673 Personal history of transient ischemic attack (TIA), and cerebral infarction without residual deficits: Secondary | ICD-10-CM | POA: Diagnosis not present

## 2022-05-22 DIAGNOSIS — E039 Hypothyroidism, unspecified: Secondary | ICD-10-CM | POA: Diagnosis not present

## 2022-05-22 DIAGNOSIS — R791 Abnormal coagulation profile: Secondary | ICD-10-CM | POA: Diagnosis not present

## 2022-05-22 LAB — BASIC METABOLIC PANEL
Anion gap: 9 (ref 5–15)
BUN: 45 mg/dL — ABNORMAL HIGH (ref 8–23)
CO2: 25 mmol/L (ref 22–32)
Calcium: 8.6 mg/dL — ABNORMAL LOW (ref 8.9–10.3)
Chloride: 102 mmol/L (ref 98–111)
Creatinine, Ser: 2.21 mg/dL — ABNORMAL HIGH (ref 0.61–1.24)
GFR, Estimated: 28 mL/min — ABNORMAL LOW (ref >60)
Glucose, Bld: 88 mg/dL (ref 70–99)
Potassium: 4.1 mmol/L (ref 3.5–5.1)
Sodium: 136 mmol/L (ref 135–145)

## 2022-05-22 LAB — GLUCOSE, CAPILLARY
Glucose-Capillary: 108 mg/dL — ABNORMAL HIGH (ref 70–99)
Glucose-Capillary: 145 mg/dL — ABNORMAL HIGH (ref 70–99)

## 2022-05-22 LAB — CBC
HCT: 44.7 % (ref 39.0–52.0)
Hemoglobin: 14.4 g/dL (ref 13.0–17.0)
MCH: 30.3 pg (ref 26.0–34.0)
MCHC: 32.2 g/dL (ref 30.0–36.0)
MCV: 94.1 fL (ref 80.0–100.0)
Platelets: 150 10*3/uL (ref 150–400)
RBC: 4.75 MIL/uL (ref 4.22–5.81)
RDW: 14.6 % (ref 11.5–15.5)
WBC: 11.1 10*3/uL — ABNORMAL HIGH (ref 4.0–10.5)
nRBC: 0 % (ref 0.0–0.2)

## 2022-05-22 MED ORDER — DEXAMETHASONE 6 MG PO TABS: 6.0000 mg | ORAL_TABLET | Freq: Every day | ORAL | 0 refills | Status: DC

## 2022-05-22 NOTE — Care Management Obs Status (Signed)
[facility] NOTIFICATION   Patient Details  Name: [name] MRN: [id number] Date of Birth: 1932/01/25   Medicare Observation Status Notification Given:  Yes    Angelita Ingles, RN 05/22/2022, 12:16 PM

## 2022-05-22 NOTE — Progress Notes (Signed)
SATURATION QUALIFICATIONS:   Patient Saturations on Room Air at Rest = 99%  Patient Saturations on Room Air while Ambulating = 90%  Pt walked inside the room with the help of walker. No SOB was noted. MD made aware.

## 2022-05-22 NOTE — Progress Notes (Signed)
  Transition of Care [facility]) Screening Note   Patient Details  Name: [name] Date of Birth: 04-25-1932   Transition of Care [facility]) CM/SW Contact:    Dessa Phi, RN Phone Number: 05/22/2022, 11:06 AM    Transition of Care [facility]) has reviewed patient and no TOC needs have been identified at this time. We will continue to monitor patient advancement through interdisciplinary progression rounds. If new patient transition needs arise, please place a TOC consult.

## 2022-05-22 NOTE — Discharge Instructions (Signed)

## 2022-05-22 NOTE — Discharge Summary (Signed)
Physician Discharge Summary  [name] EGB:[id number] DOB: 1931/11/13 DOA: 05/21/2022  PCP: [name], MD  Admit date: 05/21/2022 Discharge date: 05/22/2022  Admitted From: Home Disposition:  Home  Recommendations for Outpatient Follow-up:  Follow up with PCP in 1 week Please obtain BMP/CBC in 1 week to ensure stability of renal function and white count.  He was found to have an elevated D-dimer in the hospital.  A VQ scan was not done but because of his clinical improvement, it was thought to be related to his primary problem.  If he starts showing symptoms of a DVT/PE, please take this elevation into account.  Home Health: No Equipment/Devices: None  Discharge Condition: Good CODE STATUS: Full Diet recommendation: Heart healthy  Brief/Interim Summary: From H&P: "[name] is a 89 y.o. male with medical history significant of coronary artery disease, ischemic cardiomyopathy with pacemaker placement with pacemaker placement, carotid stenosis on the left, CVA, CKD stage 4, diabetes, hypothyroidism, OSA presented to the ER for low blood pressure.  Over the last week, he has been dealing with an upper respiratory infection.  Both he and his wife tested positive for COVID at home.  He has slight shortness of breath and a cough in addition to runny/stuffy nose, sore throat, and ear pressure.  He denies any shortness of breath, chest pain, nausea, vomiting, diarrhea, abdominal pain, or calf pain. "  Interim: He initially had a D-dimer but a VQ scan was never done.  With the improvement of his symptoms, it was thought to be related to his COVID.  The patient was weaned off of oxygen to maintain saturations even upon ambulation.  He received 2 doses of Decadron.  He reports feeling better after his blood pressure came up with some fluids.  Subjective on day of discharge: No fevers, chest pain, shortness of breath, or nausea/vomiting/diarrhea.  He is ready to go home.  Discharge  Diagnoses:  Principal Problem: Acute respiratory failure ([place]) due to pneumonia due to COVID-19 virus  Respiratory failure resolved  Patient saturating well on room air on the date of discharge  He will take Decadron 6 mg daily for 5 days  Active Problems:   Cardiomyopathy, ischemic             Continue Toprol XL 25 mg daily     CAD (coronary artery disease)                      Continue aspirin 81 mg daily, Plavix 75 mg daily and Crestor 40 mg daily             Sublingual nitro 0.4 mg every 5 minutes as needed for chest pain     Hypothyroidism             Continue levothyroxine 25 mcg daily     Type II diabetes mellitus             Resume home insulin regimen      Mild vascular dementia             Continue Namenda 10 mg twice daily    CKD IV  Monitor as outpt    Elevated D dimer  Given clinical improvement, was thought to be related to primary problem   Discharge Instructions  Discharge Instructions     Call MD for:  difficulty breathing, headache or visual disturbances   Complete by: As directed    Call MD for:  persistant dizziness or light-headedness  Complete by: As directed    Diet - low sodium heart healthy   Complete by: As directed    Increase activity slowly   Complete by: As directed       Allergies as of 05/22/2022   No Known Allergies      Medication List     TAKE these medications    acetaminophen 500 MG tablet Commonly known as: TYLENOL Take 500 mg by mouth daily.   aspirin 81 MG tablet Take 81 mg by mouth every evening.   clopidogrel 75 MG tablet Commonly known as: PLAVIX Take 1 tablet by mouth once daily What changed: when to take this   Colace 100 MG capsule Generic drug: docusate sodium Take 100 mg by mouth daily.   COQ10 150 MG Caps Take 150 mg by mouth every evening.   dexamethasone 6 MG tablet Commonly known as: Decadron Take 1 tablet (6 mg total) by mouth daily for 5 days.   Dulcolax 5 MG EC tablet Generic drug:  bisacodyl Take 5 mg by mouth daily as needed for severe constipation.   levothyroxine 25 MCG tablet Commonly known as: SYNTHROID Take 25 mcg by mouth daily.   memantine 10 MG tablet Commonly known as: NAMENDA Take 1 tablet (10 mg total) by mouth 2 (two) times daily.   metoprolol succinate 25 MG 24 hr tablet Commonly known as: TOPROL-XL Take 1 tablet (25 mg total) by mouth daily. What changed: when to take this   nitroGLYCERIN 0.4 MG SL tablet Commonly known as: NITROSTAT DISSOLVE ONE TABLET UNDER THE TONGUE EVERY 5 MINUTES AS NEEDED FOR CHEST PAIN. What changed: See the new instructions.   NovoLIN 70/30 Kwikpen (70-30) 100 UNIT/ML KwikPen Generic drug: insulin isophane & regular human KwikPen Inject 18-55 Units into the skin 2 (two) times daily. Takes 55 units in the morning and 18 units at night. What changed: Another medication with the same name was removed. Continue taking this medication, and follow the directions you see here.   OVER THE COUNTER MEDICATION Take 1 tablet by mouth every evening. cocoavia   polyethylene glycol 17 g packet Commonly known as: MIRALAX / GLYCOLAX Take 17 g by mouth daily.   RENAL MULTIVITAMIN/ZINC PO Take 1 tablet by mouth every evening.   rosuvastatin 40 MG tablet Commonly known as: CRESTOR Take 1 tablet by mouth once daily        No Known Allergies  Consultations: None   Procedures/Studies: CT Head Wo Contrast  Result Date: 05/21/2022 CLINICAL DATA:  Mental status change, unknown cause EXAM: CT HEAD WITHOUT CONTRAST TECHNIQUE: Contiguous axial images were obtained from the base of the skull through the vertex without intravenous contrast. RADIATION DOSE REDUCTION: This exam was performed according to the departmental dose-optimization program which includes automated exposure control, adjustment of the mA and/or kV according to patient size and/or use of iterative reconstruction technique. COMPARISON:  03/04/2022 FINDINGS:  Brain: No evidence of acute infarction, hemorrhage, hydrocephalus, extra-axial collection or mass lesion/mass effect. Patchy low-density changes within the periventricular and subcortical white matter compatible with chronic microvascular ischemic change. Mild diffuse cerebral volume loss. Vascular: Atherosclerotic calcifications involving the large vessels of the skull base. No unexpected hyperdense vessel. Skull: Normal. Negative for fracture or focal lesion. Sinuses/Orbits: Mucosal thickening of the bilateral maxillary sinuses and ethmoid air cells. Other: None. IMPRESSION: 1. No acute intracranial findings. 2. Chronic microvascular ischemic change and cerebral volume loss. 3. Paranasal sinus disease. Electronically Signed   By: Davina Poke D.O.  On: 05/21/2022 13:48   DG Chest 2 View  Result Date: 05/21/2022 CLINICAL DATA:  COVID positive EXAM: CHEST - 2 VIEW COMPARISON:  03/04/2022 FINDINGS: Cardiac silhouette is prominent. There is bibasilar linear subsegmental atelectasis. No pneumothorax. Normal pulmonary vasculature. Calcified aorta. Right-sided pacer in place. IMPRESSION: Enlarged cardiac silhouette. Minimal bibasilar subsegmental atelectasis or consolidation. Electronically Signed   By: Sammie Bench M.D.   On: 05/21/2022 13:41    Discharge Exam: Vitals:   05/22/22 0206 05/22/22 0611  BP: 123/72 137/74  Pulse: 82 88  Resp: 16 18  Temp: 99.6 F (37.6 C) 97.7 F (36.5 C)  SpO2: 94% 97%    General: Pt is alert, awake, not in acute distress Cardiovascular: RRR Respiratory: CTA bilaterally, no wheezing, no rhonchi, no respiratory distress, no conversational dyspnea  Abdominal: Soft, NT, mild distension, bowel sounds + Extremities: no edema, no cyanosis Psych: Normal mood and affect, stable judgement and insight     The results of significant diagnostics from this hospitalization (including imaging, microbiology, ancillary and laboratory) are listed below for reference.      Microbiology: Recent Results (from the past 240 hour(s))  Resp panel by RT-PCR (RSV, Flu A&B, Covid) Anterior Nasal Swab     Status: Abnormal   Collection Time: 05/21/22  1:59 PM   Specimen: Anterior Nasal Swab  Result Value Ref Range Status   SARS Coronavirus 2 by RT PCR POSITIVE (A) NEGATIVE Final    Comment: (NOTE) SARS-CoV-2 target nucleic acids are DETECTED.  The SARS-CoV-2 RNA is generally detectable in upper respiratory specimens during the acute phase of infection. Positive results are indicative of the presence of the identified virus, but do not rule out bacterial infection or co-infection with other pathogens not detected by the test. Clinical correlation with patient history and other diagnostic information is necessary to determine patient infection status. The expected result is Negative.  Fact Sheet for Patients: EntrepreneurPulse.com.au  Fact Sheet for Healthcare Providers: IncredibleEmployment.be  This test is not yet approved or cleared by the [place] FDA and  has been authorized for detection and/or diagnosis of SARS-CoV-2 by FDA under an Emergency Use Authorization (EUA).  This EUA will remain in effect (meaning this test can be used) for the duration of  the COVID-19 declaration under Section 564(b)(1) of the A ct, 21 U.S.C. section 360bbb-3(b)(1), unless the authorization is terminated or revoked sooner.     Influenza A by PCR NEGATIVE NEGATIVE Final   Influenza B by PCR NEGATIVE NEGATIVE Final    Comment: (NOTE) The Xpert Xpress SARS-CoV-2/FLU/RSV plus assay is intended as an aid in the diagnosis of influenza from Nasopharyngeal swab specimens and should not be used as a sole basis for treatment. Nasal washings and aspirates are unacceptable for Xpert Xpress SARS-CoV-2/FLU/RSV testing.  Fact Sheet for Patients: EntrepreneurPulse.com.au  Fact Sheet for Healthcare  Providers: IncredibleEmployment.be  This test is not yet approved or cleared by the [place] FDA and has been authorized for detection and/or diagnosis of SARS-CoV-2 by FDA under an Emergency Use Authorization (EUA). This EUA will remain in effect (meaning this test can be used) for the duration of the COVID-19 declaration under Section 564(b)(1) of the Act, 21 U.S.C. section 360bbb-3(b)(1), unless the authorization is terminated or revoked.     Resp Syncytial Virus by PCR NEGATIVE NEGATIVE Final    Comment: (NOTE) Fact Sheet for Patients: EntrepreneurPulse.com.au  Fact Sheet for Healthcare Providers: IncredibleEmployment.be  This test is not yet approved or cleared by the [place]  FDA and has been authorized for detection and/or diagnosis of SARS-CoV-2 by FDA under an Emergency Use Authorization (EUA). This EUA will remain in effect (meaning this test can be used) for the duration of the COVID-19 declaration under Section 564(b)(1) of the Act, 21 U.S.C. section 360bbb-3(b)(1), unless the authorization is terminated or revoked.  Performed at [facility], [place] [address], [place], [place] [address]      Labs: BNP (last 3 results) Recent Labs    05/21/22 1448  BNP 44.3   Basic Metabolic Panel: Recent Labs  Lab 05/21/22 1233 05/22/22 0448  NA 138 136  K 4.6 4.1  CL 105 102  CO2 23 25  GLUCOSE 103* 88  BUN 44* 45*  CREATININE 2.26* 2.21*  CALCIUM 9.2 8.6*   Liver Function Tests: Recent Labs  Lab 05/21/22 1233  AST 23  ALT 19  ALKPHOS 61  BILITOT 1.1  PROT 7.0  ALBUMIN 3.6   CBC: Recent Labs  Lab 05/21/22 1233 05/22/22 0448  WBC 11.6* 11.1*  NEUTROABS 8.8*  --   HGB 15.4 14.4  HCT 48.0 44.7  MCV 93.2 94.1  PLT 152 150   CBG: Recent Labs  Lab 05/21/22 1256 05/21/22 1835 05/21/22 2155 05/22/22 0715 05/22/22 1145  GLUCAP 112* 78 175* 108* 145*    D-Dimer Recent Labs    05/21/22 1434  DDIMER 0.85*   Urinalysis    Component Value Date/Time   COLORURINE YELLOW 03/04/2022 1415   APPEARANCEUR HAZY (A) 03/04/2022 1415   LABSPEC 1.015 03/04/2022 1415   PHURINE 6.0 03/04/2022 1415   GLUCOSEU 500 (A) 03/04/2022 1415   HGBUR NEGATIVE 03/04/2022 1415   BILIRUBINUR NEGATIVE 03/04/2022 1415   KETONESUR NEGATIVE 03/04/2022 1415   PROTEINUR TRACE (A) 03/04/2022 1415   UROBILINOGEN 0.2 06/29/2010 0930   NITRITE NEGATIVE 03/04/2022 1415   LEUKOCYTESUR NEGATIVE 03/04/2022 1415   Sepsis Labs Recent Labs  Lab 05/21/22 1233 05/22/22 0448  WBC 11.6* 11.1*   Microbiology Recent Results (from the past 240 hour(s))  Resp panel by RT-PCR (RSV, Flu A&B, Covid) Anterior Nasal Swab     Status: Abnormal   Collection Time: 05/21/22  1:59 PM   Specimen: Anterior Nasal Swab  Result Value Ref Range Status   SARS Coronavirus 2 by RT PCR POSITIVE (A) NEGATIVE Final    Comment: (NOTE) SARS-CoV-2 target nucleic acids are DETECTED.  The SARS-CoV-2 RNA is generally detectable in upper respiratory specimens during the acute phase of infection. Positive results are indicative of the presence of the identified virus, but do not rule out bacterial infection or co-infection with other pathogens not detected by the test. Clinical correlation with patient history and other diagnostic information is necessary to determine patient infection status. The expected result is Negative.  Fact Sheet for Patients: EntrepreneurPulse.com.au  Fact Sheet for Healthcare Providers: IncredibleEmployment.be  This test is not yet approved or cleared by the [place] FDA and  has been authorized for detection and/or diagnosis of SARS-CoV-2 by FDA under an Emergency Use Authorization (EUA).  This EUA will remain in effect (meaning this test can be used) for the duration of  the COVID-19 declaration under Section 564(b)(1) of  the A ct, 21 U.S.C. section 360bbb-3(b)(1), unless the authorization is terminated or revoked sooner.     Influenza A by PCR NEGATIVE NEGATIVE Final   Influenza B by PCR NEGATIVE NEGATIVE Final    Comment: (NOTE) The Xpert Xpress SARS-CoV-2/FLU/RSV plus assay is intended as an aid in the  diagnosis of influenza from Nasopharyngeal swab specimens and should not be used as a sole basis for treatment. Nasal washings and aspirates are unacceptable for Xpert Xpress SARS-CoV-2/FLU/RSV testing.  Fact Sheet for Patients: EntrepreneurPulse.com.au  Fact Sheet for Healthcare Providers: IncredibleEmployment.be  This test is not yet approved or cleared by the [place] FDA and has been authorized for detection and/or diagnosis of SARS-CoV-2 by FDA under an Emergency Use Authorization (EUA). This EUA will remain in effect (meaning this test can be used) for the duration of the COVID-19 declaration under Section 564(b)(1) of the Act, 21 U.S.C. section 360bbb-3(b)(1), unless the authorization is terminated or revoked.     Resp Syncytial Virus by PCR NEGATIVE NEGATIVE Final    Comment: (NOTE) Fact Sheet for Patients: EntrepreneurPulse.com.au  Fact Sheet for Healthcare Providers: IncredibleEmployment.be  This test is not yet approved or cleared by the [place] FDA and has been authorized for detection and/or diagnosis of SARS-CoV-2 by FDA under an Emergency Use Authorization (EUA). This EUA will remain in effect (meaning this test can be used) for the duration of the COVID-19 declaration under Section 564(b)(1) of the Act, 21 U.S.C. section 360bbb-3(b)(1), unless the authorization is terminated or revoked.  Performed at [facility], [place] [address] [place], [place] [address]      Patient was seen and examined on the day of discharge and was found to be in stable condition. Time  coordinating discharge: 25 minutes including assessment and coordination of care, as well as examination of the patient.   SIGNED:  Shelda Pal, DO [facility] 05/22/2022, 1:17 PM

## 2022-05-22 NOTE — Care Management Obs Status (Signed)
[facility] NOTIFICATION   Patient Details  Name: GURJOT BRISCO MRN: [id number] Date of Birth: Nov 11, 1931   Medicare Observation Status Notification Given:  Yes    Angelita Ingles, RN 05/22/2022, 12:17 PM

## 2022-05-23 LAB — GLUCOSE, CAPILLARY: Glucose-Capillary: 69 mg/dL — ABNORMAL LOW (ref 70–99)

## 2022-05-26 DIAGNOSIS — R062 Wheezing: Secondary | ICD-10-CM | POA: Diagnosis not present

## 2022-05-26 DIAGNOSIS — R031 Nonspecific low blood-pressure reading: Secondary | ICD-10-CM | POA: Diagnosis not present

## 2022-05-26 DIAGNOSIS — U071 COVID-19: Secondary | ICD-10-CM | POA: Diagnosis not present

## 2022-05-26 DIAGNOSIS — R531 Weakness: Secondary | ICD-10-CM | POA: Diagnosis not present

## 2022-05-26 DIAGNOSIS — N184 Chronic kidney disease, stage 4 (severe): Secondary | ICD-10-CM | POA: Diagnosis not present

## 2022-05-27 ENCOUNTER — Other Ambulatory Visit: Payer: Self-pay

## 2022-05-27 ENCOUNTER — Emergency Department (HOSPITAL_COMMUNITY): Payer: Medicare Other

## 2022-05-27 ENCOUNTER — Inpatient Hospital Stay (HOSPITAL_COMMUNITY)
Admission: EM | Admit: 2022-05-27 | Discharge: 2022-06-01 | DRG: 177 | Disposition: A | Discharge status: Home Health | Payer: Medicare Other | Attending: Internal Medicine | Admitting: Internal Medicine

## 2022-05-27 DIAGNOSIS — R918 Other nonspecific abnormal finding of lung field: Secondary | ICD-10-CM | POA: Diagnosis not present

## 2022-05-27 DIAGNOSIS — R739 Hyperglycemia, unspecified: Secondary | ICD-10-CM | POA: Diagnosis not present

## 2022-05-27 DIAGNOSIS — F01A0 Vascular dementia, mild, without behavioral disturbance, psychotic disturbance, mood disturbance, and anxiety: Secondary | ICD-10-CM | POA: Diagnosis not present

## 2022-05-27 DIAGNOSIS — E1165 Type 2 diabetes mellitus with hyperglycemia: Secondary | ICD-10-CM | POA: Diagnosis not present

## 2022-05-27 DIAGNOSIS — Z794 Long term (current) use of insulin: Secondary | ICD-10-CM

## 2022-05-27 DIAGNOSIS — N184 Chronic kidney disease, stage 4 (severe): Secondary | ICD-10-CM | POA: Diagnosis present

## 2022-05-27 DIAGNOSIS — E1122 Type 2 diabetes mellitus with diabetic chronic kidney disease: Secondary | ICD-10-CM | POA: Diagnosis present

## 2022-05-27 DIAGNOSIS — J1282 Pneumonia due to coronavirus disease 2019: Secondary | ICD-10-CM | POA: Diagnosis not present

## 2022-05-27 DIAGNOSIS — I130 Hypertensive heart and chronic kidney disease with heart failure and stage 1 through stage 4 chronic kidney disease, or unspecified chronic kidney disease: Secondary | ICD-10-CM | POA: Diagnosis present

## 2022-05-27 DIAGNOSIS — I2510 Atherosclerotic heart disease of native coronary artery without angina pectoris: Secondary | ICD-10-CM | POA: Diagnosis present

## 2022-05-27 DIAGNOSIS — R269 Unspecified abnormalities of gait and mobility: Secondary | ICD-10-CM | POA: Diagnosis not present

## 2022-05-27 DIAGNOSIS — K5900 Constipation, unspecified: Secondary | ICD-10-CM | POA: Diagnosis present

## 2022-05-27 DIAGNOSIS — K429 Umbilical hernia without obstruction or gangrene: Secondary | ICD-10-CM | POA: Diagnosis present

## 2022-05-27 DIAGNOSIS — T380X5A Adverse effect of glucocorticoids and synthetic analogues, initial encounter: Secondary | ICD-10-CM | POA: Diagnosis present

## 2022-05-27 DIAGNOSIS — I5042 Chronic combined systolic (congestive) and diastolic (congestive) heart failure: Secondary | ICD-10-CM | POA: Diagnosis present

## 2022-05-27 DIAGNOSIS — Z8249 Family history of ischemic heart disease and other diseases of the circulatory system: Secondary | ICD-10-CM

## 2022-05-27 DIAGNOSIS — U071 COVID-19: Secondary | ICD-10-CM | POA: Diagnosis not present

## 2022-05-27 DIAGNOSIS — R0602 Shortness of breath: Secondary | ICD-10-CM | POA: Diagnosis not present

## 2022-05-27 DIAGNOSIS — I442 Atrioventricular block, complete: Secondary | ICD-10-CM | POA: Diagnosis not present

## 2022-05-27 DIAGNOSIS — J9690 Respiratory failure, unspecified, unspecified whether with hypoxia or hypercapnia: Secondary | ICD-10-CM | POA: Diagnosis not present

## 2022-05-27 DIAGNOSIS — E782 Mixed hyperlipidemia: Secondary | ICD-10-CM | POA: Diagnosis present

## 2022-05-27 DIAGNOSIS — Z955 Presence of coronary angioplasty implant and graft: Secondary | ICD-10-CM

## 2022-05-27 DIAGNOSIS — I255 Ischemic cardiomyopathy: Secondary | ICD-10-CM | POA: Diagnosis present

## 2022-05-27 DIAGNOSIS — I495 Sick sinus syndrome: Secondary | ICD-10-CM | POA: Diagnosis present

## 2022-05-27 DIAGNOSIS — Z950 Presence of cardiac pacemaker: Secondary | ICD-10-CM

## 2022-05-27 DIAGNOSIS — H903 Sensorineural hearing loss, bilateral: Secondary | ICD-10-CM | POA: Diagnosis not present

## 2022-05-27 DIAGNOSIS — J9811 Atelectasis: Secondary | ICD-10-CM | POA: Diagnosis present

## 2022-05-27 DIAGNOSIS — E11649 Type 2 diabetes mellitus with hypoglycemia without coma: Secondary | ICD-10-CM | POA: Diagnosis not present

## 2022-05-27 DIAGNOSIS — J9601 Acute respiratory failure with hypoxia: Secondary | ICD-10-CM | POA: Diagnosis not present

## 2022-05-27 DIAGNOSIS — E1142 Type 2 diabetes mellitus with diabetic polyneuropathy: Secondary | ICD-10-CM | POA: Diagnosis present

## 2022-05-27 DIAGNOSIS — Z79890 Hormone replacement therapy (postmenopausal): Secondary | ICD-10-CM

## 2022-05-27 DIAGNOSIS — R531 Weakness: Secondary | ICD-10-CM | POA: Diagnosis not present

## 2022-05-27 DIAGNOSIS — N179 Acute kidney failure, unspecified: Secondary | ICD-10-CM | POA: Diagnosis present

## 2022-05-27 DIAGNOSIS — G4733 Obstructive sleep apnea (adult) (pediatric): Secondary | ICD-10-CM | POA: Diagnosis present

## 2022-05-27 DIAGNOSIS — R609 Edema, unspecified: Secondary | ICD-10-CM | POA: Diagnosis not present

## 2022-05-27 DIAGNOSIS — I700 Atherosclerosis of aorta: Secondary | ICD-10-CM | POA: Diagnosis present

## 2022-05-27 DIAGNOSIS — E039 Hypothyroidism, unspecified: Secondary | ICD-10-CM | POA: Diagnosis present

## 2022-05-27 DIAGNOSIS — E871 Hypo-osmolality and hyponatremia: Secondary | ICD-10-CM | POA: Diagnosis not present

## 2022-05-27 DIAGNOSIS — I959 Hypotension, unspecified: Secondary | ICD-10-CM | POA: Diagnosis present

## 2022-05-27 DIAGNOSIS — R55 Syncope and collapse: Secondary | ICD-10-CM | POA: Diagnosis not present

## 2022-05-27 DIAGNOSIS — Z8673 Personal history of transient ischemic attack (TIA), and cerebral infarction without residual deficits: Secondary | ICD-10-CM

## 2022-05-27 DIAGNOSIS — Z79899 Other long term (current) drug therapy: Secondary | ICD-10-CM

## 2022-05-27 DIAGNOSIS — K8020 Calculus of gallbladder without cholecystitis without obstruction: Secondary | ICD-10-CM | POA: Diagnosis not present

## 2022-05-27 DIAGNOSIS — R5383 Other fatigue: Secondary | ICD-10-CM | POA: Diagnosis not present

## 2022-05-27 DIAGNOSIS — M7989 Other specified soft tissue disorders: Secondary | ICD-10-CM | POA: Diagnosis not present

## 2022-05-27 DIAGNOSIS — Z7982 Long term (current) use of aspirin: Secondary | ICD-10-CM

## 2022-05-27 DIAGNOSIS — Z7902 Long term (current) use of antithrombotics/antiplatelets: Secondary | ICD-10-CM

## 2022-05-27 LAB — CBC WITH DIFFERENTIAL/PLATELET
Abs Immature Granulocytes: 0.21 10*3/uL — ABNORMAL HIGH (ref 0.00–0.07)
Basophils Absolute: 0 10*3/uL (ref 0.0–0.1)
Basophils Relative: 0 %
Eosinophils Absolute: 0.1 10*3/uL (ref 0.0–0.5)
Eosinophils Relative: 0 %
HCT: 40.5 % (ref 39.0–52.0)
Hemoglobin: 13.6 g/dL (ref 13.0–17.0)
Immature Granulocytes: 1 %
Lymphocytes Relative: 7 %
Lymphs Abs: 1.2 10*3/uL (ref 0.7–4.0)
MCH: 30.3 pg (ref 26.0–34.0)
MCHC: 33.6 g/dL (ref 30.0–36.0)
MCV: 90.2 fL (ref 80.0–100.0)
Monocytes Absolute: 0.8 10*3/uL (ref 0.1–1.0)
Monocytes Relative: 5 %
Neutro Abs: 14.3 10*3/uL — ABNORMAL HIGH (ref 1.7–7.7)
Neutrophils Relative %: 87 %
Platelets: 202 10*3/uL (ref 150–400)
RBC: 4.49 MIL/uL (ref 4.22–5.81)
RDW: 14.1 % (ref 11.5–15.5)
WBC: 16.5 10*3/uL — ABNORMAL HIGH (ref 4.0–10.5)
nRBC: 0 % (ref 0.0–0.2)

## 2022-05-27 LAB — COMPREHENSIVE METABOLIC PANEL
ALT: 23 U/L (ref 0–44)
AST: 22 U/L (ref 15–41)
Albumin: 2.6 g/dL — ABNORMAL LOW (ref 3.5–5.0)
Alkaline Phosphatase: 53 U/L (ref 38–126)
Anion gap: 14 (ref 5–15)
BUN: 128 mg/dL — ABNORMAL HIGH (ref 8–23)
CO2: 22 mmol/L (ref 22–32)
Calcium: 8.7 mg/dL — ABNORMAL LOW (ref 8.9–10.3)
Chloride: 97 mmol/L — ABNORMAL LOW (ref 98–111)
Creatinine, Ser: 3.25 mg/dL — ABNORMAL HIGH (ref 0.61–1.24)
GFR, Estimated: 18 mL/min — ABNORMAL LOW (ref >60)
Glucose, Bld: 295 mg/dL — ABNORMAL HIGH (ref 70–99)
Potassium: 4.8 mmol/L (ref 3.5–5.1)
Sodium: 133 mmol/L — ABNORMAL LOW (ref 135–145)
Total Bilirubin: 0.6 mg/dL (ref 0.3–1.2)
Total Protein: 6.1 g/dL — ABNORMAL LOW (ref 6.5–8.1)

## 2022-05-27 LAB — CBC
HCT: 37.1 % — ABNORMAL LOW (ref 39.0–52.0)
Hemoglobin: 12.9 g/dL — ABNORMAL LOW (ref 13.0–17.0)
MCH: 31.3 pg (ref 26.0–34.0)
MCHC: 34.8 g/dL (ref 30.0–36.0)
MCV: 90 fL (ref 80.0–100.0)
Platelets: 197 10*3/uL (ref 150–400)
RBC: 4.12 MIL/uL — ABNORMAL LOW (ref 4.22–5.81)
RDW: 14.1 % (ref 11.5–15.5)
WBC: 19.6 10*3/uL — ABNORMAL HIGH (ref 4.0–10.5)
nRBC: 0 % (ref 0.0–0.2)

## 2022-05-27 LAB — FIBRINOGEN: Fibrinogen: 783 mg/dL — ABNORMAL HIGH (ref 210–475)

## 2022-05-27 LAB — C-REACTIVE PROTEIN: CRP: 14.7 mg/dL — ABNORMAL HIGH (ref <1.0)

## 2022-05-27 LAB — VITAMIN D 25 HYDROXY (VIT D DEFICIENCY, FRACTURES): Vit D, 25-Hydroxy: 43.53 ng/mL (ref 30–100)

## 2022-05-27 LAB — FERRITIN: Ferritin: 392 ng/mL — ABNORMAL HIGH (ref 24–336)

## 2022-05-27 LAB — BRAIN NATRIURETIC PEPTIDE: B Natriuretic Peptide: 106.2 pg/mL — ABNORMAL HIGH (ref 0.0–100.0)

## 2022-05-27 LAB — PROTIME-INR
INR: 1.3 — ABNORMAL HIGH (ref 0.8–1.2)
Prothrombin Time: 15.9 seconds — ABNORMAL HIGH (ref 11.4–15.2)

## 2022-05-27 LAB — LACTIC ACID, PLASMA: Lactic Acid, Venous: 1.9 mmol/L (ref 0.5–1.9)

## 2022-05-27 LAB — MAGNESIUM: Magnesium: 2.5 mg/dL — ABNORMAL HIGH (ref 1.7–2.4)

## 2022-05-27 LAB — D-DIMER, QUANTITATIVE: D-Dimer, Quant: 0.85 ug/mL-FEU — ABNORMAL HIGH (ref 0.00–0.50)

## 2022-05-27 MED ORDER — HEPARIN SODIUM (PORCINE) 5000 UNIT/ML IJ SOLN
5000.0000 [IU] | Freq: Three times a day (TID) | INTRAMUSCULAR | Status: DC
  Administered 2022-05-27 – 2022-06-01 (×14): 5000 [IU] via SUBCUTANEOUS
  Filled 2022-05-27 (×14): qty 1

## 2022-05-27 MED ORDER — METHYLPREDNISOLONE SODIUM SUCC 125 MG IJ SOLR
0.5000 mg/kg | Freq: Two times a day (BID) | INTRAMUSCULAR | Status: AC
  Administered 2022-05-28 – 2022-05-29 (×2): 48.75 mg via INTRAVENOUS
  Filled 2022-05-27 (×5): qty 2

## 2022-05-27 MED ORDER — HYDROCOD POLI-CHLORPHE POLI ER 10-8 MG/5ML PO SUER: 5.0000 mL | Freq: Two times a day (BID) | ORAL | Status: DC | PRN

## 2022-05-27 MED ORDER — SODIUM CHLORIDE 0.9 % IV SOLN: INTRAVENOUS | Status: AC

## 2022-05-27 MED ORDER — ASPIRIN 81 MG PO TBEC
81.0000 mg | DELAYED_RELEASE_TABLET | Freq: Every evening | ORAL | Status: DC
  Administered 2022-05-27 – 2022-05-31 (×5): 81 mg via ORAL
  Filled 2022-05-27 (×5): qty 1

## 2022-05-27 MED ORDER — MOLNUPIRAVIR EUA 200MG CAPSULE: 4.0000 | ORAL_CAPSULE | Freq: Two times a day (BID) | ORAL | Status: DC

## 2022-05-27 MED ORDER — NIRMATRELVIR/RITONAVIR (PAXLOVID) TABLET (RENAL DOSING): 2.0000 | ORAL_TABLET | Freq: Two times a day (BID) | ORAL | Status: DC

## 2022-05-27 MED ORDER — METOPROLOL SUCCINATE ER 25 MG PO TB24
25.0000 mg | ORAL_TABLET | Freq: Every day | ORAL | Status: DC
  Administered 2022-05-28: 25 mg via ORAL
  Filled 2022-05-27: qty 1

## 2022-05-27 MED ORDER — LEVOTHYROXINE SODIUM 25 MCG PO TABS
25.0000 ug | ORAL_TABLET | Freq: Every day | ORAL | Status: DC
  Administered 2022-05-28 – 2022-06-01 (×5): 25 ug via ORAL
  Filled 2022-05-27 (×5): qty 1

## 2022-05-27 MED ORDER — ROSUVASTATIN CALCIUM 20 MG PO TABS
40.0000 mg | ORAL_TABLET | Freq: Every day | ORAL | Status: DC
  Administered 2022-05-28 – 2022-06-01 (×5): 40 mg via ORAL
  Filled 2022-05-27 (×6): qty 2

## 2022-05-27 MED ORDER — NITROGLYCERIN 0.4 MG SL SUBL: 0.4000 mg | SUBLINGUAL_TABLET | SUBLINGUAL | Status: DC | PRN

## 2022-05-27 MED ORDER — ONDANSETRON HCL 4 MG/2ML IJ SOLN: 4.0000 mg | Freq: Four times a day (QID) | INTRAMUSCULAR | Status: DC | PRN

## 2022-05-27 MED ORDER — INSULIN ISOPHANE & REGULAR (HUMAN 70-30)100 UNIT/ML KWIKPEN: 18.0000 [IU] | PEN_INJECTOR | Freq: Two times a day (BID) | SUBCUTANEOUS | Status: DC

## 2022-05-27 MED ORDER — VITAMIN C 500 MG PO TABS
500.0000 mg | ORAL_TABLET | Freq: Every day | ORAL | Status: DC
  Administered 2022-05-27 – 2022-06-01 (×6): 500 mg via ORAL
  Filled 2022-05-27 (×6): qty 1

## 2022-05-27 MED ORDER — ZINC SULFATE 220 (50 ZN) MG PO CAPS
220.0000 mg | ORAL_CAPSULE | Freq: Every day | ORAL | Status: DC
  Administered 2022-05-27 – 2022-06-01 (×6): 220 mg via ORAL
  Filled 2022-05-27 (×6): qty 1

## 2022-05-27 MED ORDER — CLOPIDOGREL BISULFATE 75 MG PO TABS
75.0000 mg | ORAL_TABLET | Freq: Every day | ORAL | Status: DC
  Administered 2022-05-28 – 2022-06-01 (×5): 75 mg via ORAL
  Filled 2022-05-27 (×5): qty 1

## 2022-05-27 MED ORDER — SODIUM CHLORIDE 0.9 % IV SOLN
100.0000 mg | Freq: Every day | INTRAVENOUS | Status: AC
  Administered 2022-05-28 – 2022-05-29 (×2): 100 mg via INTRAVENOUS
  Filled 2022-05-27 (×2): qty 20

## 2022-05-27 MED ORDER — INSULIN ASPART 100 UNIT/ML IJ SOLN
0.0000 [IU] | Freq: Three times a day (TID) | INTRAMUSCULAR | Status: DC
  Administered 2022-05-28 (×3): 2 [IU] via SUBCUTANEOUS
  Administered 2022-05-29: 3 [IU] via SUBCUTANEOUS
  Administered 2022-05-29: 2 [IU] via SUBCUTANEOUS
  Administered 2022-05-30: 3 [IU] via SUBCUTANEOUS
  Administered 2022-05-30: 2 [IU] via SUBCUTANEOUS

## 2022-05-27 MED ORDER — SODIUM CHLORIDE 0.9 % IV SOLN
200.0000 mg | Freq: Once | INTRAVENOUS | Status: AC
  Administered 2022-05-27: 200 mg via INTRAVENOUS
  Filled 2022-05-27: qty 40

## 2022-05-27 MED ORDER — ONDANSETRON HCL 4 MG PO TABS: 4.0000 mg | ORAL_TABLET | Freq: Four times a day (QID) | ORAL | Status: DC | PRN

## 2022-05-27 MED ORDER — MEMANTINE HCL 10 MG PO TABS
10.0000 mg | ORAL_TABLET | Freq: Two times a day (BID) | ORAL | Status: DC
  Administered 2022-05-28 – 2022-06-01 (×10): 10 mg via ORAL
  Filled 2022-05-27 (×10): qty 1

## 2022-05-27 MED ORDER — DOCUSATE SODIUM 100 MG PO CAPS
100.0000 mg | ORAL_CAPSULE | Freq: Every day | ORAL | Status: DC
  Administered 2022-05-28 – 2022-06-01 (×5): 100 mg via ORAL
  Filled 2022-05-27 (×5): qty 1

## 2022-05-27 MED ORDER — BISACODYL 5 MG PO TBEC: 5.0000 mg | DELAYED_RELEASE_TABLET | Freq: Every day | ORAL | Status: DC | PRN

## 2022-05-27 MED ORDER — PANTOPRAZOLE SODIUM 40 MG IV SOLR
40.0000 mg | Freq: Every day | INTRAVENOUS | Status: DC
  Administered 2022-05-27 – 2022-05-28 (×2): 40 mg via INTRAVENOUS
  Filled 2022-05-27 (×2): qty 10

## 2022-05-27 MED ORDER — GUAIFENESIN-DM 100-10 MG/5ML PO SYRP: 10.0000 mL | ORAL_SOLUTION | ORAL | Status: DC | PRN

## 2022-05-27 MED ORDER — POLYETHYLENE GLYCOL 3350 17 G PO PACK
17.0000 g | PACK | Freq: Every day | ORAL | Status: DC
  Administered 2022-05-31 – 2022-06-01 (×2): 17 g via ORAL
  Filled 2022-05-27 (×4): qty 1

## 2022-05-27 MED ORDER — ACETAMINOPHEN 500 MG PO TABS
500.0000 mg | ORAL_TABLET | Freq: Every day | ORAL | Status: DC
  Administered 2022-05-27 – 2022-06-01 (×6): 500 mg via ORAL
  Filled 2022-05-27 (×6): qty 1

## 2022-05-27 MED ORDER — PREDNISONE 5 MG PO TABS
50.0000 mg | ORAL_TABLET | Freq: Every day | ORAL | Status: DC
  Administered 2022-05-30: 50 mg via ORAL
  Filled 2022-05-27 (×3): qty 2

## 2022-05-27 NOTE — ED Notes (Signed)
Hospitalist at bedside 

## 2022-05-27 NOTE — H&P (Signed)
History and Physical    [name] [name]:[id number] DOB: 11-16-1931 DOA: 05/27/2022  PCP: [name], MD  Patient coming from: home  I have personally briefly reviewed patient's old medical records in [place]  Chief Complaint:  acute sob  HPI: [name] is a 89 y.o. male with medical history significant of  coronary artery disease, ischemic cardiomyopathy/combined diastolic/systolic heart failure , Complete heart block s/p  pacemaker placement, carotid stenosis on the left, CVA, CKD stage 4, diabetes, hypothyroidism, OSA  not on cpap,GERD/PUD, hypothyroidism,DMII who has interim history of hospitalization  05/21/22-05/22/22 with discharge diagnosis of acute hypoxic respiratory failure due to COVID 19-pneumonia. Patient of note prior to D/c was weaned from O2. Per patient hat home he continued with his steroid taper but noted increase blood sugars and due to this followed with his priimary care who advised patient to stop taking steroid per patient. He states a few states later he noted return of cough and later progressive sob. He also noted constipation , which he states is  a chronic issue but appeared to be worse after leaving hospital because he was not on his home regimen.  He currently notes no fever/ n/v/dysuria or abdominal pain.  ED Course:  Afeb, BP:117/65, HR 97, rr 17 sat 96% 2L , ( desat mid 80's in ed on attempt to wean O2) Labs  WBC 16.5, hgb 13.6, plt 202 Inr 1.3 Na 133, K 4.8, Glu 295, cr 3.25 (2.21) albumin 2.6 Lactiv 1.9 Mag 2.5 EKGsinus LBBB Cxr: IMPRESSION: No evidence of acute cardiopulmonary process. Chronic bibasilar atelectasis and asymmetric elevation of the right hemidiaphragm.   CT abd/pelvis IMPRESSION: 1. Bilateral multifocal patchy areas of peribronchovascular ground-glass attenuation identified within the upper and lower lobes. This is most severe within the posterior and medial right lower lobe. Imaging features can be seen with  (COVID-19) pneumonia, though are nonspecific and can occur with a variety of infectious and noninfectious processes. 2. No acute findings within the abdomen or pelvis. 3. Unchanged upper limits of normal left perirectal lymph node. 4. Gallstones. 5. Fat containing umbilical hernia. 6.  Aortic Atherosclerosis (ICD10-I70.0).   Review of Systems: As per HPI otherwise 10 point review of systems negative.   Past Medical History:  Diagnosis Date   Abnormal Doppler ultrasound of carotid artery 12/16/2012   mildly abnormal doppler . no prior studies to compare to. follow up studies are recommended when clinically indicated.   Acute respiratory failure with hypoxia 11/2018   Blepharitis    CAD (coronary artery disease) 07/24/2013   Previously placed drug-eluting stents in the LAD and RCA in 2005, low risk perfusion study December 2011 in January 2014   Cardiomyopathy, ischemic 07/24/2013   EF 45-50% echo March 2014   Carotid stenosis, left 11/2018   on left 60%   Cerebrovascular disease 03/24/2022   CHB (complete heart block) 08/27/2019   Chronic combined systolic and diastolic heart failure 32/54/9826   CKD (chronic kidney disease) stage 3, GFR 30-59 ml/min 07/24/2013   Diabetic hypoglycemia 05/02/2021   Diabetic polyneuropathy 05/02/2021   Gastric ulcer    GI bleed    History of cancer    History of echocardiogram 08/15/2012   EF  45-50 % very difficult study, severe LVH, pacemaker, trace TR,mildly enlarged RV,; aorta mildly calcified   History of stress test 06/06/2012   low risk scan. fixed inferior defect consistant with prior infarction with moderatly reduced EF. no appreciable evidence of ischemia. no significant change from  previous study   Hypothyroidism 11/24/2018   Long term (current) use of insulin 05/02/2021   Mild vascular dementia 03/23/2022   Mixed hyperlipidemia 07/24/2013   Morbid obesity 02/18/2016   OSA (obstructive sleep apnea) 07/24/2013   no CPAP use    Pacemaker 2011   [employer] DDDR ; this is the third generator this pt. has had   Pain due to onychomycosis of toenails of both feet 11/13/2018   Pain in joint of right shoulder 09/15/2021   Second degree AV block 03/09/2014   Sensorineural hearing loss (SNHL) of both ears 04/08/2018   Shingles    Sinus node dysfunction 03/09/2014   TIA (transient ischemic attack) 11/2018   vs stroke not seen on CT   Tinnitus, bilateral 04/08/2018   Type II diabetes mellitus     Past Surgical History:  Procedure Laterality Date   CARDIAC CATHETERIZATION  12-29-2003   rt. coronary angiography was done with a 6 french 4 cm taper coronary cath. This demonstrated  approximately 70 to 75% mildly segmental stenosis in the mid RCA before the acute margin           CARDIAC CATHETERIZATION  11-19-2003   diagnostic cath,, pt. to get second cath to have stent placed   cardiac stents     CATARACT EXTRACTION     KNEE SURGERY Right    PPM GENERATOR CHANGEOUT N/A 04/07/2019   Procedure: PPM GENERATOR CHANGEOUT;  Surgeon: Sanda Klein, MD;  Location: [place] [facility];  Service: Cardiovascular;  Laterality: N/A;   PROSTATE BIOPSY     prostate seed implants     SPINE SURGERY     WISDOM TOOTH EXTRACTION       reports that he has never smoked. He has quit using smokeless tobacco. He reports current alcohol use. He reports that he does not use drugs.  No Known Allergies  Family History  Problem Relation Age of Onset   Memory loss Mother        with very advanced age   Heart disease Father     Prior to Admission medications   Medication Sig Start Date End Date Taking? Authorizing Provider  acetaminophen (TYLENOL) 500 MG tablet Take 500 mg by mouth daily.    [provider]  aspirin 81 MG tablet Take 81 mg by mouth every evening.     [provider]  bisacodyl (DULCOLAX) 5 MG EC tablet Take 5 mg by mouth daily as needed for severe constipation.    [provider]  clopidogrel  (PLAVIX) 75 MG tablet Take 1 tablet by mouth once daily Patient taking differently: Take 75 mg by mouth every evening. 04/10/22   Croitoru, Mihai, MD  Coenzyme Q10 (COQ10) 150 MG CAPS Take 150 mg by mouth every evening.    [provider]  dexamethasone (DECADRON) 6 MG tablet Take 1 tablet (6 mg total) by mouth daily for 5 days. 05/22/22 05/27/22  Shelda Pal, DO  docusate sodium (COLACE) 100 MG capsule Take 100 mg by mouth daily.    [provider]  levothyroxine (SYNTHROID, LEVOTHROID) 25 MCG tablet Take 25 mcg by mouth daily.  02/09/14   [provider]  memantine (NAMENDA) 10 MG tablet Take 1 tablet (10 mg total) by mouth 2 (two) times daily. 07/21/21   Rondel Jumbo, PA-C  metoprolol succinate (TOPROL-XL) 25 MG 24 hr tablet Take 1 tablet (25 mg total) by mouth daily. Patient taking differently: Take 25 mg by mouth every evening.  10/04/21   Croitoru, Mihai, MD  Multiple Vitamin (RENAL MULTIVITAMIN/ZINC PO) Take 1 tablet by mouth every evening.    [provider]  nitroGLYCERIN (NITROSTAT) 0.4 MG SL tablet DISSOLVE ONE TABLET UNDER THE TONGUE EVERY 5 MINUTES AS NEEDED FOR CHEST PAIN. Patient taking differently: Place 0.4 mg under the tongue every 5 (five) minutes as needed for chest pain. 03/13/22   Croitoru, Mihai, MD  NOVOLIN 70/30 KWIKPEN (70-30) 100 UNIT/ML KwikPen Inject 18-55 Units into the skin 2 (two) times daily. Takes 55 units in the morning and 18 units at night. 04/25/22   [provider]  OVER THE COUNTER MEDICATION Take 1 tablet by mouth every evening. cocoavia    [provider]  polyethylene glycol (MIRALAX / GLYCOLAX) 17 g packet Take 17 g by mouth daily. 08/25/21   Charlesetta Shanks, MD  rosuvastatin (CRESTOR) 40 MG tablet Take 1 tablet by mouth once daily 04/10/22   Croitoru, Dani Gobble, MD    Physical Exam: Vitals:   05/27/22 1815 05/27/22 1830 05/27/22 1900 05/27/22 2000  BP: 113/73 124/82 110/78 117/76  Pulse: 97 (!) 102  100 96  Resp: 14 (!) 21 (!) 21 (!) 22  Temp:      SpO2: 97% 97% 97% 96%  Weight:      Height:        Constitutional: NAD, calm, comfortable Vitals:   05/27/22 1815 05/27/22 1830 05/27/22 1900 05/27/22 2000  BP: 113/73 124/82 110/78 117/76  Pulse: 97 (!) 102 100 96  Resp: 14 (!) 21 (!) 21 (!) 22  Temp:      SpO2: 97% 97% 97% 96%  Weight:      Height:       Eyes: PERRL, lids and conjunctivae normal ENMT: Mucous membranes are moist. Posterior pharynx clear of any exudate or lesions.Normal dentition.  Neck: normal, supple, no masses, no thyromegaly Respiratory: course bs+ wheezing, occasional rhonchi, no crackles. Normal respiratory effort. No accessory muscle use.  Cardiovascular: Regular rate and rhythm, no murmurs / rubs / gallops. No extremity edema. 2+ pedal pulses.   Abdomen: no tenderness, no masses palpated. No hepatosplenomegaly. Bowel sounds positive.  Musculoskeletal: no clubbing / cyanosis. No joint deformity upper and lower extremities. Good ROM, no contractures. Normal muscle tone.  Skin: no rashes, lesions, ulcers. No induration Neurologic: CN 2-12 grossly intact. Sensation intact, DTR normal. Strength 5/5 in all 4.  Psychiatric: Normal judgment and insight. Alert and oriented x 3. Normal mood.    Labs on Admission: I have personally reviewed following labs and imaging studies  CBC: Recent Labs  Lab 05/21/22 1233 05/22/22 0448 05/27/22 1526  WBC 11.6* 11.1* 16.5*  NEUTROABS 8.8*  --  14.3*  HGB 15.4 14.4 13.6  HCT 48.0 44.7 40.5  MCV 93.2 94.1 90.2  PLT 152 150 528   Basic Metabolic Panel: Recent Labs  Lab 05/21/22 1233 05/22/22 0448 05/27/22 1526  NA 138 136 133*  K 4.6 4.1 4.8  CL 105 102 97*  CO2 '23 25 22  '$ GLUCOSE 103* 88 295*  BUN 44* 45* 128*  CREATININE 2.26* 2.21* 3.25*  CALCIUM 9.2 8.6* 8.7*  MG  --   --  2.5*   GFR: Estimated Creatinine Clearance: 17.5 mL/min (A) (by C-G formula based on SCr of 3.25 mg/dL (H)). Liver Function  Tests: Recent Labs  Lab 05/21/22 1233 05/27/22 1526  AST 23 22  ALT 19 23  ALKPHOS 61 53  BILITOT 1.1 0.6  PROT 7.0 6.1*  ALBUMIN 3.6 2.6*  No results for input(s): "LIPASE", "AMYLASE" in the last 168 hours. No results for input(s): "AMMONIA" in the last 168 hours. Coagulation Profile: Recent Labs  Lab 05/27/22 1526  INR 1.3*   Cardiac Enzymes: No results for input(s): "CKTOTAL", "CKMB", "CKMBINDEX", "TROPONINI" in the last 168 hours. BNP (last 3 results) No results for input(s): "PROBNP" in the last 8760 hours. HbA1C: No results for input(s): "HGBA1C" in the last 72 hours. CBG: Recent Labs  Lab 05/21/22 1814 05/21/22 1835 05/21/22 2155 05/22/22 0715 05/22/22 1145  GLUCAP 69* 78 175* 108* 145*   Lipid Profile: No results for input(s): "CHOL", "HDL", "LDLCALC", "TRIG", "CHOLHDL", "LDLDIRECT" in the last 72 hours. Thyroid Function Tests: No results for input(s): "TSH", "T4TOTAL", "FREET4", "T3FREE", "THYROIDAB" in the last 72 hours. Anemia Panel: No results for input(s): "VITAMINB12", "FOLATE", "FERRITIN", "TIBC", "IRON", "RETICCTPCT" in the last 72 hours. Urine analysis:    Component Value Date/Time   COLORURINE YELLOW 03/04/2022 1415   APPEARANCEUR HAZY (A) 03/04/2022 1415   LABSPEC 1.015 03/04/2022 1415   PHURINE 6.0 03/04/2022 1415   GLUCOSEU 500 (A) 03/04/2022 1415   HGBUR NEGATIVE 03/04/2022 1415   BILIRUBINUR NEGATIVE 03/04/2022 1415   KETONESUR NEGATIVE 03/04/2022 1415   PROTEINUR TRACE (A) 03/04/2022 1415   UROBILINOGEN 0.2 06/29/2010 0930   NITRITE NEGATIVE 03/04/2022 1415   LEUKOCYTESUR NEGATIVE 03/04/2022 1415    Radiological Exams on Admission: CT CHEST ABDOMEN PELVIS WO CONTRAST  Result Date: 05/27/2022 CLINICAL DATA:  Shortness of breath with diffuse abdominal pain EXAM: CT CHEST, ABDOMEN AND PELVIS WITHOUT CONTRAST TECHNIQUE: Multidetector CT imaging of the chest, abdomen and pelvis was performed following the standard protocol without IV  contrast. RADIATION DOSE REDUCTION: This exam was performed according to the departmental dose-optimization program which includes automated exposure control, adjustment of the mA and/or kV according to patient size and/or use of iterative reconstruction technique. COMPARISON:  CT AP 10/01/2021 FINDINGS: CT CHEST FINDINGS Cardiovascular: Normal heart size. Aortic atherosclerosis and coronary artery calcifications. Right chest wall pacer device noted with leads in the right atrial appendage and right ventricle. No pericardial effusion. Mediastinum/Nodes: Thyroid gland, trachea, and esophagus demonstrate no significant findings. No enlarged axillary or mediastinal lymph nodes. Lungs/Pleura: No pleural fluid. Bilateral multifocal patchy areas of peribronchovascular ground-glass attenuation identified within the upper and lower lobes. This is most severe within the posterior and medial right lower lobe, image 82/5. No airspace consolidation Musculoskeletal: No chest wall mass or suspicious bone lesions identified. CT ABDOMEN PELVIS FINDINGS Hepatobiliary: No focal liver abnormality identified. Multiple gallstones are identified which measure up to 5 mm. No gallbladder wall thickening or pericholecystic fluid. Pancreas: No main duct dilatation, inflammation or mass. Scattered parenchymal calcifications identified. Spleen: Normal in size without focal abnormality. Adrenals/Urinary Tract: Normal appearance of the adrenal glands. Mild bilateral and symmetric renal cortical thinning and perinephric fat stranding. No mass. No nephrolithiasis or hydronephrosis. No signs of hydroureter or ureteral lithiasis. Bladder is unremarkable. Stomach/Bowel: Stomach is within normal limits. Appendix appears normal. No evidence of bowel wall thickening, distention, or inflammatory changes. Vascular/Lymphatic: Aortic atherosclerosis. No abdominal adenopathy. Left perirectal lymph node is identified measuring 0.9 cm, image 100/6. Unchanged  when compared with the previous exam. Reproductive: Seed implants noted within the prostate gland. Other: No free fluid or fluid collections. No signs of pneumoperitoneum. Fat containing umbilical hernia. Musculoskeletal: Multilevel spondylosis throughout the lumbar spine. No acute or suspicious osseous abnormality. IMPRESSION: 1. Bilateral multifocal patchy areas of peribronchovascular ground-glass attenuation identified within the upper and lower lobes.  This is most severe within the posterior and medial right lower lobe. Imaging features can be seen with (COVID-19) pneumonia, though are nonspecific and can occur with a variety of infectious and noninfectious processes. 2. No acute findings within the abdomen or pelvis. 3. Unchanged upper limits of normal left perirectal lymph node. 4. Gallstones. 5. Fat containing umbilical hernia. 6.  Aortic Atherosclerosis (ICD10-I70.0). Electronically Signed   By: Kerby Moors M.D.   On: 05/27/2022 18:27   DG Chest 2 View  Result Date: 05/27/2022 CLINICAL DATA:  Fatigue with shortness of breath for a couple days. Suspected sepsis. EXAM: CHEST - 2 VIEW COMPARISON:  Radiographs 05/21/2022 and 03/04/2022. FINDINGS: Three views are submitted, limited by body habitus and suboptimal inspiration. Right subclavian pacemaker leads appear unchanged, projecting over the right atrium and right ventricle. Stable mild cardiomegaly. Stable asymmetric elevation of the right hemidiaphragm with associated chronic bibasilar atelectasis. No definite superimposed airspace disease, edema, significant pleural effusion or pneumothorax. No acute osseous findings are evident. Telemetry leads overlie the chest. IMPRESSION: No evidence of acute cardiopulmonary process. Chronic bibasilar atelectasis and asymmetric elevation of the right hemidiaphragm. Electronically Signed   By: Richardean Sale M.D.   On: 05/27/2022 16:52    EKG: Independently reviewed.   Assessment/Plan  Acute hypoxic  respiratory failure insetting of recent hospitalization/treatment of COVID-19 pneumonia  -start solumedrol and taper to oral steroid due to persistent hypoxemia will need longer taper  - was not treated previously with anti-viral  -will give trial of remdesivir -judicious ivfs insetting of aki -encourage po fluid intake  -check covid disease severity lab now and daily  -d-dimer was elevated on last admission however due to improvement patient did not have V/Q , will order V?Q due to re-hospitalization , will also check dopplers -dvt ppx per protocol  -pulmonary toilet  -vitamin c/zinc  -check vit D  - wean O2 as able   AKI on CKDIV -due to low volume  -gently ivfs  -hold nephrotoxic medications  -monitor renal function and I/o     Leukocytosis -increased , due to steroids  -CT scan not suggestive of superimposed bacterial infection  -will continue to monitor for signs of superinfection  Cardiomyopathy, ischemic Combined diastolic /systolic heart failure  -continue Toprol XL 25 mg daily    CAD (coronary artery disease)          -Continue aspirin 81 mg daily, Plavix 75 mg daily and Crestor 40 mg daily -no active cardiac complaints    Hypothyroidism -Continue levothyroxine 25 mcg daily   Type II diabetes mellitus  -with hyperglycemia/in setting of steroid use  -iss/fs  -Resume home insulin regimen               Mild vascular dementia -Continue Namenda 10 mg twice daily      DVT prophylaxis: heparin Code Status: full/ as discussed per patient wishes in event of cardiac arrest  Family Communication: none at bedside Disposition Plan: patient  expected to be admitted greater than 2 midnights  Consults called: n/a Admission status: progressive care   Clance Boll MD [facility]   If 7PM-7AM, please contact night-coverage [email or web address] Password [facility]  05/27/2022, 8:33 PM

## 2022-05-27 NOTE — ED Provider Notes (Signed)
[facility] EMERGENCY DEPARTMENT Provider Note   CSN: [id number] Arrival date & time: 05/27/22  1506     History  Chief Complaint  Patient presents with   Weakness    [name] is a 89 y.o. male.  Past medical history of stage IV CKD, ischemic cardiomyopathy with pacemaker placement, CAD, OSA, hyperlipidemia,, and systolic and diastolic heart failure, hypothyroidism, type 2 diabetes with diabetic polyneuropathy, cerebrovascular disease.  Presenting to the emergency department today for weakness and a syncopal event.  Was diagnosed with COVID about a week ago, has been managing things at home and doing well until yesterday.  Starting yesterday he became much weaker, difficulty getting up and moving around.  Had previously been constipated and having high blood glucose due to his Decadron.  Was admitted at [facility] long for 2 days and discharged after improvement.  Due to his constipation he has been taking stool softeners, has had 3 bowel movements today.  After his third bowel movement he attempted to stand up but was very weak and ended up passing out.  On EMS arrival, his blood pressure was 60/40, and then repeat was 80 over palp.  He was 90% on room air with EMS and working hard to breathe.  His blood glucose was 290 with EMS.  He did improve to 125/80 prior to arrival.  He is complaining of diffuse weakness, abdominal cramping.  Has had a productive cough as well.   Weakness      Home Medications Prior to Admission medications   Medication Sig Start Date End Date Taking? Authorizing Provider  acetaminophen (TYLENOL) 500 MG tablet Take 500 mg by mouth in the morning and at bedtime.    [provider]  aspirin 81 MG tablet Take 81 mg by mouth every evening.     [provider]  bisacodyl (DULCOLAX) 5 MG EC tablet Take 5 mg by mouth daily as needed for severe constipation.    [provider]  clopidogrel (PLAVIX) 75 MG tablet Take 1 tablet  by mouth once daily Patient taking differently: Take 75 mg by mouth every evening. 04/10/22   Croitoru, Mihai, MD  Coenzyme Q10 (COQ10) 150 MG CAPS Take 150 mg by mouth every evening.    [provider]  docusate sodium (COLACE) 100 MG capsule Take 100 mg by mouth daily.    [provider]  levothyroxine (SYNTHROID, LEVOTHROID) 25 MCG tablet Take 25 mcg by mouth daily.  02/09/14   [provider]  memantine (NAMENDA) 10 MG tablet Take 1 tablet (10 mg total) by mouth 2 (two) times daily. 07/21/21   Rondel Jumbo, PA-C  metoprolol succinate (TOPROL-XL) 25 MG 24 hr tablet Take 1 tablet (25 mg total) by mouth daily. Patient taking differently: Take 25 mg by mouth every evening. 10/04/21   Croitoru, Mihai, MD  Multiple Vitamin (RENAL MULTIVITAMIN/ZINC PO) Take 1 tablet by mouth every evening.    [provider]  nitroGLYCERIN (NITROSTAT) 0.4 MG SL tablet DISSOLVE ONE TABLET UNDER THE TONGUE EVERY 5 MINUTES AS NEEDED FOR CHEST PAIN. Patient taking differently: Place 0.4 mg under the tongue every 5 (five) minutes as needed for chest pain. 03/13/22   Croitoru, Mihai, MD  NOVOLIN 70/30 KWIKPEN (70-30) 100 UNIT/ML KwikPen Inject 18-55 Units into the skin 2 (two) times daily. Takes 55 units in the morning and 18 units at night. 04/25/22   [provider]  OVER THE COUNTER MEDICATION Take 1 tablet by mouth every evening.  cocoavia    [provider]  polyethylene glycol (MIRALAX / GLYCOLAX) 17 g packet Take 17 g by mouth daily. 08/25/21   Charlesetta Shanks, MD  rosuvastatin (CRESTOR) 40 MG tablet Take 1 tablet by mouth once daily 04/10/22   Croitoru, Dani Gobble, MD      Allergies    Patient has no known allergies.    Review of Systems   Review of Systems  Neurological:  Positive for weakness.    Physical Exam Updated Vital Signs BP 121/72   Pulse 94   Temp 98.8 F (37.1 C) (Oral)   Resp 17   Ht '5\' 7"'$  (1.702 m)   Wt 97.8 kg   SpO2 91%   BMI 33.77 kg/m   Physical Exam Vitals and nursing note reviewed.  Constitutional:      General: He is not in acute distress.    Appearance: He is well-developed. He is ill-appearing. He is not diaphoretic.  HENT:     Head: Normocephalic and atraumatic.  Eyes:     Conjunctiva/sclera: Conjunctivae normal.  Cardiovascular:     Rate and Rhythm: Normal rate and regular rhythm.     Pulses: Normal pulses.     Heart sounds: Normal heart sounds. No murmur heard. Pulmonary:     Effort: Pulmonary effort is normal. No respiratory distress.     Breath sounds: Normal breath sounds. No stridor. No wheezing, rhonchi or rales.     Comments: Slightly diminished breath sounds at bilateral bases, worse on the right than the left Abdominal:     Palpations: Abdomen is soft.     Tenderness: There is abdominal tenderness (Diffuse). There is no guarding or rebound.     Hernia: No hernia is present.  Musculoskeletal:        General: No swelling.     Cervical back: Neck supple.     Right lower leg: No edema.     Left lower leg: No edema.  Skin:    General: Skin is warm and dry.     Capillary Refill: Capillary refill takes less than 2 seconds.  Neurological:     General: No focal deficit present.     Mental Status: He is alert and oriented to person, place, and time.     Motor: Weakness (Global 4+/5 weakness) present.  Psychiatric:        Mood and Affect: Mood normal.     ED Results / Procedures / Treatments   Labs (all labs ordered are listed, but only abnormal results are displayed) Labs Reviewed  COMPREHENSIVE METABOLIC PANEL - Abnormal; Notable for the following components:      Result Value   Sodium 133 (*)    Chloride 97 (*)    Glucose, Bld 295 (*)    BUN 128 (*)    Creatinine, Ser 3.25 (*)    Calcium 8.7 (*)    Total Protein 6.1 (*)    Albumin 2.6 (*)    GFR, Estimated 18 (*)    All other components within normal limits  CBC WITH DIFFERENTIAL/PLATELET - Abnormal; Notable for the following  components:   WBC 16.5 (*)    Neutro Abs 14.3 (*)    Abs Immature Granulocytes 0.21 (*)    All other components within normal limits  PROTIME-INR - Abnormal; Notable for the following components:   Prothrombin Time 15.9 (*)    INR 1.3 (*)    All other components within normal limits  MAGNESIUM - Abnormal; Notable for the following components:  Magnesium 2.5 (*)    All other components within normal limits  CBC - Abnormal; Notable for the following components:   WBC 19.6 (*)    RBC 4.12 (*)    Hemoglobin 12.9 (*)    HCT 37.1 (*)    All other components within normal limits  CREATININE, SERUM - Abnormal; Notable for the following components:   Creatinine, Ser 3.36 (*)    GFR, Estimated 17 (*)    All other components within normal limits  C-REACTIVE PROTEIN - Abnormal; Notable for the following components:   CRP 14.7 (*)    All other components within normal limits  BRAIN NATRIURETIC PEPTIDE - Abnormal; Notable for the following components:   B Natriuretic Peptide 106.2 (*)    All other components within normal limits  D-DIMER, QUANTITATIVE - Abnormal; Notable for the following components:   D-Dimer, Quant 0.85 (*)    All other components within normal limits  FERRITIN - Abnormal; Notable for the following components:   Ferritin 392 (*)    All other components within normal limits  FIBRINOGEN - Abnormal; Notable for the following components:   Fibrinogen 783 (*)    All other components within normal limits  LACTATE DEHYDROGENASE - Abnormal; Notable for the following components:   LDH 373 (*)    All other components within normal limits  CBG MONITORING, ED - Abnormal; Notable for the following components:   Glucose-Capillary 246 (*)    All other components within normal limits  TROPONIN I (HIGH SENSITIVITY) - Abnormal; Notable for the following components:   Troponin I (High Sensitivity) 61 (*)    All other components within normal limits  CULTURE, BLOOD (ROUTINE X 2)   CULTURE, BLOOD (ROUTINE X 2)  RESPIRATORY PANEL BY PCR  CULTURE, BLOOD (ROUTINE X 2)  CULTURE, BLOOD (ROUTINE X 2)  EXPECTORATED SPUTUM ASSESSMENT W GRAM STAIN, RFLX TO RESP C  LACTIC ACID, PLASMA  VITAMIN D 25 HYDROXY (VIT D DEFICIENCY, FRACTURES)  LACTIC ACID, PLASMA  URINALYSIS, ROUTINE W REFLEX MICROSCOPIC  CBC WITH DIFFERENTIAL/PLATELET  COMPREHENSIVE METABOLIC PANEL  C-REACTIVE PROTEIN  D-DIMER, QUANTITATIVE  FERRITIN  MAGNESIUM  PHOSPHORUS  PROCALCITONIN  TROPONIN I (HIGH SENSITIVITY)    EKG EKG Interpretation  Date/Time:  Sunday May 28 2022 00:18:35 EST Ventricular Rate:  93 PR Interval:  195 QRS Duration: 170 QT Interval:  432 QTC Calculation: 538 R Axis:   -67 Text Interpretation: Atrial-sensed ventricular-paced rhythm No further analysis attempted due to paced rhythm Baseline wander in lead(s) V2 When compared with ECG of 05/27/2022 No significant change was found Confirmed by Delora Fuel ([id number]) on 05/28/2022 1:27:46 AM  Radiology CT CHEST ABDOMEN PELVIS WO CONTRAST  Result Date: 05/27/2022 CLINICAL DATA:  Shortness of breath with diffuse abdominal pain EXAM: CT CHEST, ABDOMEN AND PELVIS WITHOUT CONTRAST TECHNIQUE: Multidetector CT imaging of the chest, abdomen and pelvis was performed following the standard protocol without IV contrast. RADIATION DOSE REDUCTION: This exam was performed according to the departmental dose-optimization program which includes automated exposure control, adjustment of the mA and/or kV according to patient size and/or use of iterative reconstruction technique. COMPARISON:  CT AP 10/01/2021 FINDINGS: CT CHEST FINDINGS Cardiovascular: Normal heart size. Aortic atherosclerosis and coronary artery calcifications. Right chest wall pacer device noted with leads in the right atrial appendage and right ventricle. No pericardial effusion. Mediastinum/Nodes: Thyroid gland, trachea, and esophagus demonstrate no significant findings. No enlarged  axillary or mediastinal lymph nodes. Lungs/Pleura: No pleural fluid. Bilateral multifocal patchy areas of  peribronchovascular ground-glass attenuation identified within the upper and lower lobes. This is most severe within the posterior and medial right lower lobe, image 82/5. No airspace consolidation Musculoskeletal: No chest wall mass or suspicious bone lesions identified. CT ABDOMEN PELVIS FINDINGS Hepatobiliary: No focal liver abnormality identified. Multiple gallstones are identified which measure up to 5 mm. No gallbladder wall thickening or pericholecystic fluid. Pancreas: No main duct dilatation, inflammation or mass. Scattered parenchymal calcifications identified. Spleen: Normal in size without focal abnormality. Adrenals/Urinary Tract: Normal appearance of the adrenal glands. Mild bilateral and symmetric renal cortical thinning and perinephric fat stranding. No mass. No nephrolithiasis or hydronephrosis. No signs of hydroureter or ureteral lithiasis. Bladder is unremarkable. Stomach/Bowel: Stomach is within normal limits. Appendix appears normal. No evidence of bowel wall thickening, distention, or inflammatory changes. Vascular/Lymphatic: Aortic atherosclerosis. No abdominal adenopathy. Left perirectal lymph node is identified measuring 0.9 cm, image 100/6. Unchanged when compared with the previous exam. Reproductive: Seed implants noted within the prostate gland. Other: No free fluid or fluid collections. No signs of pneumoperitoneum. Fat containing umbilical hernia. Musculoskeletal: Multilevel spondylosis throughout the lumbar spine. No acute or suspicious osseous abnormality. IMPRESSION: 1. Bilateral multifocal patchy areas of peribronchovascular ground-glass attenuation identified within the upper and lower lobes. This is most severe within the posterior and medial right lower lobe. Imaging features can be seen with (COVID-19) pneumonia, though are nonspecific and can occur with a variety of  infectious and noninfectious processes. 2. No acute findings within the abdomen or pelvis. 3. Unchanged upper limits of normal left perirectal lymph node. 4. Gallstones. 5. Fat containing umbilical hernia. 6.  Aortic Atherosclerosis (ICD10-I70.0). Electronically Signed   By: Kerby Moors M.D.   On: 05/27/2022 18:27   DG Chest 2 View  Result Date: 05/27/2022 CLINICAL DATA:  Fatigue with shortness of breath for a couple days. Suspected sepsis. EXAM: CHEST - 2 VIEW COMPARISON:  Radiographs 05/21/2022 and 03/04/2022. FINDINGS: Three views are submitted, limited by body habitus and suboptimal inspiration. Right subclavian pacemaker leads appear unchanged, projecting over the right atrium and right ventricle. Stable mild cardiomegaly. Stable asymmetric elevation of the right hemidiaphragm with associated chronic bibasilar atelectasis. No definite superimposed airspace disease, edema, significant pleural effusion or pneumothorax. No acute osseous findings are evident. Telemetry leads overlie the chest. IMPRESSION: No evidence of acute cardiopulmonary process. Chronic bibasilar atelectasis and asymmetric elevation of the right hemidiaphragm. Electronically Signed   By: Richardean Sale M.D.   On: 05/27/2022 16:52    Procedures Procedures    Medications Ordered in ED Medications  aspirin EC tablet 81 mg (81 mg Oral Given 05/27/22 2301)  acetaminophen (TYLENOL) tablet 500 mg (500 mg Oral Given 05/27/22 2301)  metoprolol succinate (TOPROL-XL) 24 hr tablet 25 mg (has no administration in time range)  nitroGLYCERIN (NITROSTAT) SL tablet 0.4 mg (has no administration in time range)  rosuvastatin (CRESTOR) tablet 40 mg (has no administration in time range)  memantine (NAMENDA) tablet 10 mg (10 mg Oral Given 05/28/22 0155)  levothyroxine (SYNTHROID) tablet 25 mcg (has no administration in time range)  bisacodyl (DULCOLAX) EC tablet 5 mg (has no administration in time range)  docusate sodium (COLACE) capsule 100 mg  (has no administration in time range)  polyethylene glycol (MIRALAX / GLYCOLAX) packet 17 g (has no administration in time range)  clopidogrel (PLAVIX) tablet 75 mg (has no administration in time range)  heparin injection 5,000 Units (5,000 Units Subcutaneous Given 05/27/22 2302)  0.9 %  sodium chloride infusion ( Intravenous New Bag/Given  05/28/22 0155)  methylPREDNISolone sodium succinate (SOLU-MEDROL) 125 mg/2 mL injection 48.75 mg (0 mg Intravenous Hold 05/27/22 2312)    Followed by  predniSONE (DELTASONE) tablet 50 mg (has no administration in time range)  guaiFENesin-dextromethorphan (ROBITUSSIN DM) 100-10 MG/5ML syrup 10 mL (has no administration in time range)  chlorpheniramine-HYDROcodone (TUSSIONEX) 10-8 MG/5ML suspension 5 mL (has no administration in time range)  insulin aspart (novoLOG) injection 0-9 Units (has no administration in time range)  pantoprazole (PROTONIX) injection 40 mg (40 mg Intravenous Given 05/27/22 2301)  ondansetron (ZOFRAN) tablet 4 mg (has no administration in time range)    Or  ondansetron (ZOFRAN) injection 4 mg (has no administration in time range)  zinc sulfate capsule 220 mg (220 mg Oral Given 05/27/22 2300)  ascorbic acid (VITAMIN C) tablet 500 mg (500 mg Oral Given 05/27/22 2300)  remdesivir 200 mg in sodium chloride 0.9% 250 mL IVPB (0 mg Intravenous Stopped 05/28/22 0020)    Followed by  remdesivir 100 mg in sodium chloride 0.9 % 100 mL IVPB (has no administration in time range)  insulin aspart protamine- aspart (NOVOLOG MIX 70/30) injection 55 Units (has no administration in time range)  insulin aspart protamine- aspart (NOVOLOG MIX 70/30) injection 18 Units (has no administration in time range)    ED Course/ Medical Decision Making/ A&P                           Medical Decision Making Problems Addressed: AKI (acute kidney injury) ([place]): acute illness or injury that poses a threat to life or bodily functions COVID: acute illness or injury that poses a  threat to life or bodily functions Weakness: acute illness or injury that poses a threat to life or bodily functions  Amount and/or Complexity of Data Reviewed Independent Historian: caregiver and EMS    Details: Son External Data Reviewed: notes.    Details: notes from prior discharge summary at [facility] reviewed  Labs: ordered. Decision-making details documented in ED Course. Radiology: ordered and independent interpretation performed. Decision-making details documented in ED Course. ECG/medicine tests: ordered and independent interpretation performed. Decision-making details documented in ED Course. Discussion of management or test interpretation with external provider(s): Hospitalist  Risk Decision regarding hospitalization.   This is a 89 y.o. male who presents to the emergency department with weakness and syncope. History is obtained from patient and son. Medical history reviewed in EMR.  Past medical/surgical history that increases complexity of ED encounter:  stage IV CKD, ischemic cardiomyopathy with pacemaker placement, CAD, OSA, hyperlipidemia,, and systolic and diastolic heart failure, hypothyroidism, type 2 diabetes with diabetic polyneuropathy, cerebrovascular disease.  Initial Assessment:  With patient's presentation of weakness and a syncopal event after being on the toilet, most likely vasovagal event.  However has been significant weak over the past couple of days in regard to his COVID infection.  Do have concern for other complicating illness such as secondary bacterial infection, volume losses with his diarrhea causing AKI or electrolyte abnormality.  Weakness additionally could be from anemia, or cardiac abnormality such as ACS or arrhythmia.  Initial Plan: Laboratory evaluation with CBC, CMP, lactic acid, PT/INR, magnesium, urinalysis, blood cultures Chest x-ray and CT of the chest/abdomen/pelvis without contrast due to renal function ECG  Interpretation of  Diagnostics: I personally reviewed the labs, CT, chest x-ray, ECG and my interpretation is as follows: Patient has acute kidney injury with creatinine up to 3.25 from baseline between 2 and 2.2.  Additionally,  white blood cell count of 16,500.  This is likely due to his Decadron use in the setting of COVID infection.  ECG without any signs of acute ischemia or significant electrolyte abnormalities, no concerning ST segment changes.  CT of the chest shows expected changes in the setting of COVID with bilateral groundglass opacities, but no significant pleural effusion or pulmonary edema.  CT of the abdomen pelvis shows no acute findings.  Patient mated to the hospital service for further management of his COVID.  Did develop an oxygen requirement while in the emergency department, desatting to 85% on room air.  Was placed on 2 L nasal cannula oxygen.  Had no further acute events the emergency department prior to being admitted to the hospitalist setting.  Discussed with them, will need further evaluation for DVT/PE, as a possible etiology of his hypoxia and syncopal event earlier today on the toilet.  In the meantime we will order bilateral DVT studies, due to his renal function not a great candidate for CTA of the chest, today will further evaluate that in the inpatient setting.  MDM generated using voice dictation software and may contain dictation errors. Please contact me for any clarification or with any questions.          Final Clinical Impression(s) / ED Diagnoses Final diagnoses:  Weakness  AKI (acute kidney injury) ([place])  COVID    Rx / DC Orders ED Discharge Orders     None         Phyllis Ginger, MD 05/28/22 [id number]    Carmin Muskrat, MD 05/28/22 1656

## 2022-05-27 NOTE — ED Triage Notes (Addendum)
Pt BIBGEMS after sitting on the toilet while getting ready to go to UC, he became very weak and pale. Pt just dx with COVID on Monday. He has been getting weaker since. Original pressure for EMS 60/40 then 80/palp. 92% on room air initial. C/o abdominal pain from being constipated per patient.  100 NS given in route.   HR 110 96 % 2 LPM [place] CBG 290 125/80 last BP

## 2022-05-28 ENCOUNTER — Encounter (HOSPITAL_COMMUNITY): Payer: BLUE CROSS/BLUE SHIELD

## 2022-05-28 DIAGNOSIS — J9601 Acute respiratory failure with hypoxia: Secondary | ICD-10-CM | POA: Diagnosis not present

## 2022-05-28 LAB — BLOOD CULTURE ID PANEL (REFLEXED) - BCID2

## 2022-05-28 LAB — CBC WITH DIFFERENTIAL/PLATELET
Abs Immature Granulocytes: 0.16 10*3/uL — ABNORMAL HIGH (ref 0.00–0.07)
Basophils Absolute: 0 10*3/uL (ref 0.0–0.1)
Basophils Relative: 0 %
Eosinophils Absolute: 0.3 10*3/uL (ref 0.0–0.5)
Eosinophils Relative: 2 %
HCT: 33.3 % — ABNORMAL LOW (ref 39.0–52.0)
Hemoglobin: 11.3 g/dL — ABNORMAL LOW (ref 13.0–17.0)
Immature Granulocytes: 1 %
Lymphocytes Relative: 11 %
Lymphs Abs: 2.2 10*3/uL (ref 0.7–4.0)
MCH: 30.5 pg (ref 26.0–34.0)
MCHC: 33.9 g/dL (ref 30.0–36.0)
MCV: 89.8 fL (ref 80.0–100.0)
Monocytes Absolute: 0.9 10*3/uL (ref 0.1–1.0)
Monocytes Relative: 4 %
Neutro Abs: 15.9 10*3/uL — ABNORMAL HIGH (ref 1.7–7.7)
Neutrophils Relative %: 82 %
Platelets: 213 10*3/uL (ref 150–400)
RBC: 3.71 MIL/uL — ABNORMAL LOW (ref 4.22–5.81)
RDW: 14.3 % (ref 11.5–15.5)
WBC: 19.5 10*3/uL — ABNORMAL HIGH (ref 4.0–10.5)
nRBC: 0 % (ref 0.0–0.2)

## 2022-05-28 LAB — LACTATE DEHYDROGENASE: LDH: 373 U/L — ABNORMAL HIGH (ref 98–192)

## 2022-05-28 LAB — PHOSPHORUS: Phosphorus: 4.5 mg/dL (ref 2.5–4.6)

## 2022-05-28 LAB — URINALYSIS, ROUTINE W REFLEX MICROSCOPIC
Bilirubin Urine: NEGATIVE
Glucose, UA: 50 mg/dL — AB
Hgb urine dipstick: NEGATIVE
Ketones, ur: NEGATIVE mg/dL
Leukocytes,Ua: NEGATIVE
Nitrite: NEGATIVE
Protein, ur: NEGATIVE mg/dL
Specific Gravity, Urine: 1.017 (ref 1.005–1.030)
pH: 5 (ref 5.0–8.0)

## 2022-05-28 LAB — FERRITIN: Ferritin: 400 ng/mL — ABNORMAL HIGH (ref 24–336)

## 2022-05-28 LAB — COMPREHENSIVE METABOLIC PANEL
ALT: 21 U/L (ref 0–44)
AST: 24 U/L (ref 15–41)
Albumin: 2.4 g/dL — ABNORMAL LOW (ref 3.5–5.0)
Alkaline Phosphatase: 48 U/L (ref 38–126)
Anion gap: 14 (ref 5–15)
BUN: 133 mg/dL — ABNORMAL HIGH (ref 8–23)
CO2: 20 mmol/L — ABNORMAL LOW (ref 22–32)
Calcium: 8.3 mg/dL — ABNORMAL LOW (ref 8.9–10.3)
Chloride: 101 mmol/L (ref 98–111)
Creatinine, Ser: 3.17 mg/dL — ABNORMAL HIGH (ref 0.61–1.24)
GFR, Estimated: 18 mL/min — ABNORMAL LOW (ref >60)
Glucose, Bld: 198 mg/dL — ABNORMAL HIGH (ref 70–99)
Potassium: 4.1 mmol/L (ref 3.5–5.1)
Sodium: 135 mmol/L (ref 135–145)
Total Bilirubin: 0.7 mg/dL (ref 0.3–1.2)
Total Protein: 5.4 g/dL — ABNORMAL LOW (ref 6.5–8.1)

## 2022-05-28 LAB — RESPIRATORY PANEL BY PCR

## 2022-05-28 LAB — CREATININE, SERUM
Creatinine, Ser: 3.36 mg/dL — ABNORMAL HIGH (ref 0.61–1.24)
GFR, Estimated: 17 mL/min — ABNORMAL LOW (ref >60)

## 2022-05-28 LAB — CBG MONITORING, ED
Glucose-Capillary: 189 mg/dL — ABNORMAL HIGH (ref 70–99)
Glucose-Capillary: 246 mg/dL — ABNORMAL HIGH (ref 70–99)

## 2022-05-28 LAB — C-REACTIVE PROTEIN: CRP: 11.2 mg/dL — ABNORMAL HIGH (ref <1.0)

## 2022-05-28 LAB — MAGNESIUM: Magnesium: 2.6 mg/dL — ABNORMAL HIGH (ref 1.7–2.4)

## 2022-05-28 LAB — GLUCOSE, CAPILLARY
Glucose-Capillary: 163 mg/dL — ABNORMAL HIGH (ref 70–99)
Glucose-Capillary: 159 mg/dL — ABNORMAL HIGH (ref 70–99)
Glucose-Capillary: 106 mg/dL — ABNORMAL HIGH (ref 70–99)

## 2022-05-28 LAB — TROPONIN I (HIGH SENSITIVITY): Troponin I (High Sensitivity): 61 ng/L — ABNORMAL HIGH (ref <18)

## 2022-05-28 LAB — D-DIMER, QUANTITATIVE: D-Dimer, Quant: 0.95 ug/mL-FEU — ABNORMAL HIGH (ref 0.00–0.50)

## 2022-05-28 LAB — PROCALCITONIN: Procalcitonin: 0.96 ng/mL

## 2022-05-28 MED ORDER — ALBUTEROL SULFATE (2.5 MG/3ML) 0.083% IN NEBU: 2.5000 mg | INHALATION_SOLUTION | RESPIRATORY_TRACT | Status: DC | PRN

## 2022-05-28 MED ORDER — INSULIN ASPART PROT & ASPART (70-30 MIX) 100 UNIT/ML ~~LOC~~ SUSP
55.0000 [IU] | Freq: Every morning | SUBCUTANEOUS | Status: DC
  Administered 2022-05-29 – 2022-06-01 (×4): 55 [IU] via SUBCUTANEOUS
  Filled 2022-05-28 (×3): qty 10

## 2022-05-28 MED ORDER — MIDODRINE HCL 5 MG PO TABS
2.5000 mg | ORAL_TABLET | Freq: Three times a day (TID) | ORAL | Status: DC
  Administered 2022-05-28: 2.5 mg via ORAL
  Filled 2022-05-28: qty 1

## 2022-05-28 MED ORDER — SODIUM CHLORIDE 0.9 % IV SOLN: INTRAVENOUS | Status: AC

## 2022-05-28 MED ORDER — INSULIN ASPART PROT & ASPART (70-30 MIX) 100 UNIT/ML ~~LOC~~ SUSP
18.0000 [IU] | Freq: Every evening | SUBCUTANEOUS | Status: DC
  Administered 2022-05-28 – 2022-05-31 (×3): 18 [IU] via SUBCUTANEOUS
  Filled 2022-05-28: qty 10

## 2022-05-28 MED ORDER — IPRATROPIUM-ALBUTEROL 0.5-2.5 (3) MG/3ML IN SOLN
3.0000 mL | Freq: Four times a day (QID) | RESPIRATORY_TRACT | Status: DC
  Filled 2022-05-28: qty 3

## 2022-05-28 MED ORDER — BUDESONIDE 0.5 MG/2ML IN SUSP
0.5000 mg | Freq: Two times a day (BID) | RESPIRATORY_TRACT | Status: DC
  Administered 2022-05-28 – 2022-06-01 (×5): 0.5 mg via RESPIRATORY_TRACT
  Filled 2022-05-28 (×9): qty 2

## 2022-05-28 NOTE — ED Notes (Signed)
Attempted to give report to inpatient RN.

## 2022-05-28 NOTE — Progress Notes (Signed)
PHARMACY - PHYSICIAN COMMUNICATION CRITICAL VALUE ALERT - BLOOD CULTURE IDENTIFICATION (BCID)  [name] is an 89 y.o. male who presented to [facility] on 05/27/2022 with COVID pneumonia   Assessment:  currently afebrile, wbc elevated - on steroids Called by micro lab this evening  1/8 BCx - with staph epi - probable contaminant   Name of physician (or Provider) Contacted: Dr Bridgett Larsson   Current antibiotics: none   Changes to prescribed antibiotics recommended:  No changes needed   Results for orders placed or performed during the hospital encounter of 05/27/22  Blood Culture ID Panel (Reflexed) (Collected: 05/27/2022 10:30 PM)  Result Value Ref Range   Enterococcus faecalis NOT DETECTED NOT DETECTED   Enterococcus Faecium NOT DETECTED NOT DETECTED   Listeria monocytogenes NOT DETECTED NOT DETECTED   Staphylococcus species DETECTED (A) NOT DETECTED   Staphylococcus aureus (BCID) NOT DETECTED NOT DETECTED   Staphylococcus epidermidis DETECTED (A) NOT DETECTED   Staphylococcus lugdunensis NOT DETECTED NOT DETECTED   Streptococcus species NOT DETECTED NOT DETECTED   Streptococcus agalactiae NOT DETECTED NOT DETECTED   Streptococcus pneumoniae NOT DETECTED NOT DETECTED   Streptococcus pyogenes NOT DETECTED NOT DETECTED   A.calcoaceticus-baumannii NOT DETECTED NOT DETECTED   Bacteroides fragilis NOT DETECTED NOT DETECTED   Enterobacterales NOT DETECTED NOT DETECTED   Enterobacter cloacae complex NOT DETECTED NOT DETECTED   Escherichia coli NOT DETECTED NOT DETECTED   Klebsiella aerogenes NOT DETECTED NOT DETECTED   Klebsiella oxytoca NOT DETECTED NOT DETECTED   Klebsiella pneumoniae NOT DETECTED NOT DETECTED   Proteus species NOT DETECTED NOT DETECTED   Salmonella species NOT DETECTED NOT DETECTED   Serratia marcescens NOT DETECTED NOT DETECTED   Haemophilus influenzae NOT DETECTED NOT DETECTED   Neisseria meningitidis NOT DETECTED NOT DETECTED   Pseudomonas aeruginosa NOT DETECTED  NOT DETECTED   Stenotrophomonas maltophilia NOT DETECTED NOT DETECTED   Candida albicans NOT DETECTED NOT DETECTED   Candida auris NOT DETECTED NOT DETECTED   Candida glabrata NOT DETECTED NOT DETECTED   Candida krusei NOT DETECTED NOT DETECTED   Candida parapsilosis NOT DETECTED NOT DETECTED   Candida tropicalis NOT DETECTED NOT DETECTED   Cryptococcus neoformans/gattii NOT DETECTED NOT DETECTED   Methicillin resistance mecA/C DETECTED (A) NOT DETECTED     Bonnita Nasuti Pharm.D. CPP, BCPS Clinical Pharmacist [phone] 05/28/2022 7:08 PM

## 2022-05-28 NOTE — Plan of Care (Signed)

## 2022-05-28 NOTE — ED Notes (Signed)
R.T. called to assess for CPAP

## 2022-05-28 NOTE — Care Management (Signed)
  Transition of Care [facility]) Screening Note   Patient Details  Name: [name] Date of Birth: April 28, 1932   Transition of [facility]) CM/SW Contact:    Carles Collet, RN Phone Number: 05/28/2022, 2:25 PM    Transition of Care Department [facility]) has reviewed patient and  We will continue to monitor patient advancement through interdisciplinary progression rounds. If new patient transition needs arise, please place a TOC consult.

## 2022-05-28 NOTE — Progress Notes (Signed)
PROGRESS NOTE    ROCK SOBOL  ION:[id number] DOB: 29-Dec-1931 DOA: 05/27/2022 PCP: [name], MD    Brief Narrative:   [name] is a 89 y.o. male with past medical history significant for CAD, ischemic cardiomyopathy, combined systolic/diastolic congestive heart failure, CHB s/p PPM, left carotid artery stenosis, CKD stage IV, DM2, hypothyroidism, OSA not on CPAP, history of CVA, who presented to [facility] ED on 1/6 with generalized weakness and syncopal event.  Patient recently diagnosed with COVID-19 viral infection about a week ago, hospitalized at [facility] long and discharged after 2 days of inpatient treatment with steroid taper.  Apparently his blood sugars had been elevated and his primary physician directed patient to discontinue steroids at that time.  Following discharge, patient had been constipated and while attempting to stand up from the toilet felt weak and ended up passing out.  On EMS arrival, patient blood pressure was noted to be 60/40 with repeat 80 over palp.  Patient was noted to be dyspneic oxygenating 90% on room air.  Glucose was 290.  And patient was transported to the ED for further evaluation.  In the ED, temperature 98.2 F, HR 97, RR 17, BP 117/65, SpO2 85% on room air.  WBC 16.5, hemoglobin 13.6, platelets 202.  Sodium 133, potassium 4.8, chloride 97, CO2 22, glucose 295, BUN 128, creatinine 3.25.  AST 22, ALT 23, total bilirubin 0.6.  Lactic acid 1.9.  Procalcitonin 0.96.  Urinalysis unrevealing.  Respiratory viral panel negative.  Chest x-ray with no evidence of acute cardiopulmonary disease process.  Chronic bibasilar atelectasis and asymmetric elevation of the right hemidiaphragm.  CT chest/abdomen/pelvis with bilateral multifocal patchy areas of peribronchovascular groundglass attenuation within the upper and lower lobes, most severe within the posterior medial right lower lobe consistent with Covid-19 pneumonia, no acute findings within the  abdomen or pelvis.  TRH was consulted for admission for further evaluation and management of acute respiratory failure secondary to residual effects of COVID-19 viral pneumonia and hypotension, syncopal event.  Assessment & Plan:   Acute hypoxic respiratory failure, POA COVID-19 viral pneumonia Patient presenting to ED via EMS after syncopal events while using the bathroom with progressive shortness of breath.  Recently diagnosed with COVID-19 and discharged from [facility] on 05/22/2022.  Patient was discharged on steroids that were discontinued by his PCP due to elevated glucose.  Patient was noted to be hypoxic on ED arrival with SpO2 85% and CT chest with findings of groundglass attenuation within the upper and lower lobes consistent with a viral pneumonia; no focal consolidation noted. -- CRP 14.7>11.2 -- Nuclear medicine VQ scan: Pending -- Remdesivir IV daily -- Solu-Medrol 48.75 mg IV q12h x 3 days followed by prednisone -- Pulmicort neb twice daily -- DuoNeb every 6 hours -- Albuterol neb every 2 hours as needed wheezing/shortness of breath -- Antitussives -- BiPAP as needed, attempt to wean to nasal cannula -- Continue supplemental oxygen, maintain SpO2 > 92%  Acute renal failure on CKD stage IV Baseline creatinine 2.1-2.2.  Creatinine elevated on admission of 3.25.  CT abdomen/pelvis on admission with mild bilateral and symmetric renal cortical thinning and perinephric fat stranding with no mass, no nephrolithiasis no hydronephrosis. -- Cr 3.25>3.36>3.17 -- NS at 50 mL/h x 1 day -- Avoid nephrotoxins, renal dose all medications -- Repeat BMP in a.m.  Lower extremity edema Elevated D-dimer D-dimer elevated 0.95.  Likely in the setting of active infection from a viral pneumonia with inflammation. -- Will  check vascular duplex ultrasound bilateral lower extremities -- Nuclear medicine VQ scan pending as above -- Monitor on telemetry  Hypotension: Resolved Patient was  noted to be hypotensive on EMS arrival.  Received IV fluid hydration and now improved. -- Resumed home metoprolol -- Continue monitor BP closely  Hyponatremia Sodium 133 on admission, likely hypovolemia, natremia in the setting of poor oral intake. -- Na 133>135 -- continue IVF hydration -- BMP daily  Leukocytosis WBC count elevated 16.5 on admission, likely secondary to steroid effect.  Procalcitonin 0.9.  No focal consolidation noted on chest x-ray and CT chest.  No acute abnormalities CT abdomen/pelvis. -- CBC daily  Ischemic cardiomyopathy History of combined systolic/diastolic congestive heart failure, compensated No signs of pulmonary vascular congestion noted on chest x-ray or CT chest.  TTE 05/02/2021 with LVEF 40-45%, LV with mildly decreased function, but overall suboptimal study.  Follows with cardiology outpatient, Dr. Keene Breath -- Metoprolol succinate 25 mg p.o. daily -- Continue aspirin, statin, Plavix -- Strict I's and O's Daily weights -- Monitor on telemetry  Hx sick sinus syndrome s/p PPM -- Continue monitor on telemetry -- Outpatient follow-up cardiology  CAD -- Aspirin 81 mg p.o. daily -- Plavix 75 mg p.o. daily -- Crestor 40 mg p.o. daily  Hypothyroidism TSH 2.469, within normal limits. -- Levothyroxine 25 mg p.o. daily  Type 2 diabetes mellitus, with hyperglycemia Hemoglobin A1c 9.1 on 03/05/2022, poorly controlled.  Now with elevated glucose likely secondary to steroid use in the setting of COVID viral pneumonia as above.  Home regimen includes NPH 70/30 55 units every morning, 18 units every afternoon.  Vascular dementia, mild -- Namenda 10 mg p.o. twice daily  Constipation -- MiraLAX daily -- Closely monitor bowel movements   DVT prophylaxis: heparin injection 5,000 Units Start: 05/27/22 2200    Code Status: Full Code Family Communication: No family present at bedside this morning, updated spouse via telephone this morning  Disposition Plan:   Level of care: Progressive Status is: Inpatient Remains inpatient appropriate because: IV steroids, needs weaning from BiPAP.  Anticipate possible discharge home in 2-3 days    Consultants:  None  Procedures:  BiPAP Vascular duplex ultrasound bilateral lower extremities: Pending Nuclear medicine VQ scan: Pending  Antimicrobials:  None   Subjective: Patient seen examined bedside, resting complaint.  Remains in ED holding area.  On BiPAP.  Reports his breathing has improved.  Reports that he needs to go to the bathroom, have a bowel movement.  No family present.  No other questions or concerns at this time.  Denies headache, no dizziness, no chest pain, no palpitations, no abdominal pain, no focal weakness, no fatigue, no cough/congestion, no fever/chills/night sweats.  No acute events overnight per nursing staff.  Spouse updated by telephone this morning.  Objective: Vitals:   05/28/22 0050 05/28/22 0100 05/28/22 0641 05/28/22 0932  BP: 110/74 121/72 132/70 109/74  Pulse: 99 94 89 92  Resp: '14 17 14   '$ Temp:  98.8 F (37.1 C) 98.4 F (36.9 C)   TempSrc:  Oral    SpO2: 95% 91% 95%   Weight:      Height:        Intake/Output Summary (Last 24 hours) at 05/28/2022 1022 Last data filed at 05/28/2022 1012 Gross per 24 hour  Intake 871.25 ml  Output --  Net 871.25 ml   Filed Weights   05/27/22 1524  Weight: 97.8 kg    Examination:  Physical Exam: GEN: NAD, alert, chronically ill appearance, appears  older than stated age, on BiPAP HEENT: NCAT, PERRL, EOMI, sclera clear, MMM PULM: Breath sounds diminished bilateral bases, normal Respaire effort without accessory muscle use, no crackles, on BiPAP CV: RRR w/o M/G/R GI: abd soft, NTND, NABS, no R/G/M MSK: + 1 + bilateral peripheral edema, moves all extremity dependently NEURO: No focal deficits appreciated PSYCH: normal mood/affect Integumentary: Chronic venous changes bilateral lower extremities, hyperpigmentation anterior  shins, no other concerning rashes/lesions/wounds noted on exposed skin surfaces    Data Reviewed: I have personally reviewed following labs and imaging studies  CBC: Recent Labs  Lab 05/21/22 1233 05/22/22 0448 05/27/22 1526 05/27/22 2230 05/28/22 0640  WBC 11.6* 11.1* 16.5* 19.6* 19.5*  NEUTROABS 8.8*  --  14.3*  --  15.9*  HGB 15.4 14.4 13.6 12.9* 11.3*  HCT 48.0 44.7 40.5 37.1* 33.3*  MCV 93.2 94.1 90.2 90.0 89.8  PLT 152 150 202 197 147   Basic Metabolic Panel: Recent Labs  Lab 05/21/22 1233 05/22/22 0448 05/27/22 1526 05/27/22 2230 05/28/22 0640  NA 138 136 133*  --  135  K 4.6 4.1 4.8  --  4.1  CL 105 102 97*  --  101  CO2 '23 25 22  '$ --  20*  GLUCOSE 103* 88 295*  --  198*  BUN 44* 45* 128*  --  133*  CREATININE 2.26* 2.21* 3.25* 3.36* 3.17*  CALCIUM 9.2 8.6* 8.7*  --  8.3*  MG  --   --  2.5*  --  2.6*  PHOS  --   --   --   --  4.5   GFR: Estimated Creatinine Clearance: 18 mL/min (A) (by C-G formula based on SCr of 3.17 mg/dL (H)). Liver Function Tests: Recent Labs  Lab 05/21/22 1233 05/27/22 1526 05/28/22 0640  AST '23 22 24  '$ ALT '19 23 21  '$ ALKPHOS 61 53 48  BILITOT 1.1 0.6 0.7  PROT 7.0 6.1* 5.4*  ALBUMIN 3.6 2.6* 2.4*   No results for input(s): "LIPASE", "AMYLASE" in the last 168 hours. No results for input(s): "AMMONIA" in the last 168 hours. Coagulation Profile: Recent Labs  Lab 05/27/22 1526  INR 1.3*   Cardiac Enzymes: No results for input(s): "CKTOTAL", "CKMB", "CKMBINDEX", "TROPONINI" in the last 168 hours. BNP (last 3 results) No results for input(s): "PROBNP" in the last 8760 hours. HbA1C: No results for input(s): "HGBA1C" in the last 72 hours. CBG: Recent Labs  Lab 05/21/22 2155 05/22/22 0715 05/22/22 1145 05/28/22 0017 05/28/22 0922  GLUCAP 175* 108* 145* 246* 189*   Lipid Profile: No results for input(s): "CHOL", "HDL", "LDLCALC", "TRIG", "CHOLHDL", "LDLDIRECT" in the last 72 hours. Thyroid Function Tests: No results  for input(s): "TSH", "T4TOTAL", "FREET4", "T3FREE", "THYROIDAB" in the last 72 hours. Anemia Panel: Recent Labs    05/27/22 2230 05/28/22 0640  FERRITIN 392* 400*   Sepsis Labs: Recent Labs  Lab 05/21/22 1234 05/21/22 1434 05/27/22 1526 05/28/22 0200  PROCALCITON  --   --   --  0.96  LATICACIDVEN 1.2 1.3 1.9  --     Recent Results (from the past 240 hour(s))  Resp panel by RT-PCR (RSV, Flu A&B, Covid) Anterior Nasal Swab     Status: Abnormal   Collection Time: 05/21/22  1:59 PM   Specimen: Anterior Nasal Swab  Result Value Ref Range Status   SARS Coronavirus 2 by RT PCR POSITIVE (A) NEGATIVE Final    Comment: (NOTE) SARS-CoV-2 target nucleic acids are DETECTED.  The SARS-CoV-2 RNA is generally detectable in  upper respiratory specimens during the acute phase of infection. Positive results are indicative of the presence of the identified virus, but do not rule out bacterial infection or co-infection with other pathogens not detected by the test. Clinical correlation with patient history and other diagnostic information is necessary to determine patient infection status. The expected result is Negative.  Fact Sheet for Patients: EntrepreneurPulse.com.au  Fact Sheet for Healthcare Providers: IncredibleEmployment.be  This test is not yet approved or cleared by the [place] FDA and  has been authorized for detection and/or diagnosis of SARS-CoV-2 by FDA under an Emergency Use Authorization (EUA).  This EUA will remain in effect (meaning this test can be used) for the duration of  the COVID-19 declaration under Section 564(b)(1) of the A ct, 21 U.S.C. section 360bbb-3(b)(1), unless the authorization is terminated or revoked sooner.     Influenza A by PCR NEGATIVE NEGATIVE Final   Influenza B by PCR NEGATIVE NEGATIVE Final    Comment: (NOTE) The Xpert Xpress SARS-CoV-2/FLU/RSV plus assay is intended as an aid in the diagnosis of  influenza from Nasopharyngeal swab specimens and should not be used as a sole basis for treatment. Nasal washings and aspirates are unacceptable for Xpert Xpress SARS-CoV-2/FLU/RSV testing.  Fact Sheet for Patients: EntrepreneurPulse.com.au  Fact Sheet for Healthcare Providers: IncredibleEmployment.be  This test is not yet approved or cleared by the [place] FDA and has been authorized for detection and/or diagnosis of SARS-CoV-2 by FDA under an Emergency Use Authorization (EUA). This EUA will remain in effect (meaning this test can be used) for the duration of the COVID-19 declaration under Section 564(b)(1) of the Act, 21 U.S.C. section 360bbb-3(b)(1), unless the authorization is terminated or revoked.     Resp Syncytial Virus by PCR NEGATIVE NEGATIVE Final    Comment: (NOTE) Fact Sheet for Patients: EntrepreneurPulse.com.au  Fact Sheet for Healthcare Providers: IncredibleEmployment.be  This test is not yet approved or cleared by the [place] FDA and has been authorized for detection and/or diagnosis of SARS-CoV-2 by FDA under an Emergency Use Authorization (EUA). This EUA will remain in effect (meaning this test can be used) for the duration of the COVID-19 declaration under Section 564(b)(1) of the Act, 21 U.S.C. section 360bbb-3(b)(1), unless the authorization is terminated or revoked.  Performed at [facility], [place] [address], [place], [place] [address]   Respiratory (~20 pathogens) panel by PCR     Status: None   Collection Time: 05/27/22  9:21 PM   Specimen: Nasopharyngeal Swab; Respiratory  Result Value Ref Range Status   Adenovirus NOT DETECTED NOT DETECTED Final   Coronavirus 229E NOT DETECTED NOT DETECTED Final    Comment: (NOTE) The Coronavirus on the Respiratory Panel, DOES NOT test for the novel  Coronavirus (2019 nCoV)    Coronavirus HKU1 NOT DETECTED  NOT DETECTED Final   Coronavirus NL63 NOT DETECTED NOT DETECTED Final   Coronavirus OC43 NOT DETECTED NOT DETECTED Final   Metapneumovirus NOT DETECTED NOT DETECTED Final   Rhinovirus / Enterovirus NOT DETECTED NOT DETECTED Final   Influenza A NOT DETECTED NOT DETECTED Final   Influenza B NOT DETECTED NOT DETECTED Final   Parainfluenza Virus 1 NOT DETECTED NOT DETECTED Final   Parainfluenza Virus 2 NOT DETECTED NOT DETECTED Final   Parainfluenza Virus 3 NOT DETECTED NOT DETECTED Final   Parainfluenza Virus 4 NOT DETECTED NOT DETECTED Final   Respiratory Syncytial Virus NOT DETECTED NOT DETECTED Final   Bordetella pertussis NOT DETECTED NOT DETECTED Final  Bordetella Parapertussis NOT DETECTED NOT DETECTED Final   Chlamydophila pneumoniae NOT DETECTED NOT DETECTED Final   Mycoplasma pneumoniae NOT DETECTED NOT DETECTED Final    Comment: Performed at [facility], [place] [address] [place], [place] [address]         Radiology Studies: CT CHEST ABDOMEN PELVIS WO CONTRAST  Result Date: 05/27/2022 CLINICAL DATA:  Shortness of breath with diffuse abdominal pain EXAM: CT CHEST, ABDOMEN AND PELVIS WITHOUT CONTRAST TECHNIQUE: Multidetector CT imaging of the chest, abdomen and pelvis was performed following the standard protocol without IV contrast. RADIATION DOSE REDUCTION: This exam was performed according to the departmental dose-optimization program which includes automated exposure control, adjustment of the mA and/or kV according to patient size and/or use of iterative reconstruction technique. COMPARISON:  CT AP 10/01/2021 FINDINGS: CT CHEST FINDINGS Cardiovascular: Normal heart size. Aortic atherosclerosis and coronary artery calcifications. Right chest wall pacer device noted with leads in the right atrial appendage and right ventricle. No pericardial effusion. Mediastinum/Nodes: Thyroid gland, trachea, and esophagus demonstrate no significant findings. No enlarged axillary or  mediastinal lymph nodes. Lungs/Pleura: No pleural fluid. Bilateral multifocal patchy areas of peribronchovascular ground-glass attenuation identified within the upper and lower lobes. This is most severe within the posterior and medial right lower lobe, image 82/5. No airspace consolidation Musculoskeletal: No chest wall mass or suspicious bone lesions identified. CT ABDOMEN PELVIS FINDINGS Hepatobiliary: No focal liver abnormality identified. Multiple gallstones are identified which measure up to 5 mm. No gallbladder wall thickening or pericholecystic fluid. Pancreas: No main duct dilatation, inflammation or mass. Scattered parenchymal calcifications identified. Spleen: Normal in size without focal abnormality. Adrenals/Urinary Tract: Normal appearance of the adrenal glands. Mild bilateral and symmetric renal cortical thinning and perinephric fat stranding. No mass. No nephrolithiasis or hydronephrosis. No signs of hydroureter or ureteral lithiasis. Bladder is unremarkable. Stomach/Bowel: Stomach is within normal limits. Appendix appears normal. No evidence of bowel wall thickening, distention, or inflammatory changes. Vascular/Lymphatic: Aortic atherosclerosis. No abdominal adenopathy. Left perirectal lymph node is identified measuring 0.9 cm, image 100/6. Unchanged when compared with the previous exam. Reproductive: Seed implants noted within the prostate gland. Other: No free fluid or fluid collections. No signs of pneumoperitoneum. Fat containing umbilical hernia. Musculoskeletal: Multilevel spondylosis throughout the lumbar spine. No acute or suspicious osseous abnormality. IMPRESSION: 1. Bilateral multifocal patchy areas of peribronchovascular ground-glass attenuation identified within the upper and lower lobes. This is most severe within the posterior and medial right lower lobe. Imaging features can be seen with (COVID-19) pneumonia, though are nonspecific and can occur with a variety of infectious and  noninfectious processes. 2. No acute findings within the abdomen or pelvis. 3. Unchanged upper limits of normal left perirectal lymph node. 4. Gallstones. 5. Fat containing umbilical hernia. 6.  Aortic Atherosclerosis (ICD10-I70.0). Electronically Signed   By: Kerby Moors M.D.   On: 05/27/2022 18:27   DG Chest 2 View  Result Date: 05/27/2022 CLINICAL DATA:  Fatigue with shortness of breath for a couple days. Suspected sepsis. EXAM: CHEST - 2 VIEW COMPARISON:  Radiographs 05/21/2022 and 03/04/2022. FINDINGS: Three views are submitted, limited by body habitus and suboptimal inspiration. Right subclavian pacemaker leads appear unchanged, projecting over the right atrium and right ventricle. Stable mild cardiomegaly. Stable asymmetric elevation of the right hemidiaphragm with associated chronic bibasilar atelectasis. No definite superimposed airspace disease, edema, significant pleural effusion or pneumothorax. No acute osseous findings are evident. Telemetry leads overlie the chest. IMPRESSION: No evidence of acute cardiopulmonary process. Chronic bibasilar atelectasis  and asymmetric elevation of the right hemidiaphragm. Electronically Signed   By: Richardean Sale M.D.   On: 05/27/2022 16:52        Scheduled Meds:  acetaminophen  500 mg Oral Daily   ascorbic acid  500 mg Oral Daily   aspirin EC  81 mg Oral QPM   betamethasone acetate-betamethasone sodium phosphate  3 mg Intramuscular Once   clopidogrel  75 mg Oral Daily   docusate sodium  100 mg Oral Daily   heparin  5,000 Units Subcutaneous Q8H   insulin aspart  0-9 Units Subcutaneous TID WC   insulin aspart protamine- aspart  18 Units Subcutaneous QPM   insulin aspart protamine- aspart  55 Units Subcutaneous q morning   levothyroxine  25 mcg Oral Q0600   memantine  10 mg Oral BID   methylPREDNISolone (SOLU-MEDROL) injection  0.5 mg/kg Intravenous Q12H   Followed by   Derrill Memo ON 05/30/2022] predniSONE  50 mg Oral Daily   metoprolol succinate   25 mg Oral Daily   pantoprazole (PROTONIX) IV  40 mg Intravenous QHS   polyethylene glycol  17 g Oral Daily   rosuvastatin  40 mg Oral Daily   zinc sulfate  220 mg Oral Daily   Continuous Infusions:  remdesivir 100 mg in sodium chloride 0.9 % 100 mL IVPB       LOS: 1 day    Time spent: 52 minutes spent on chart review, discussion with nursing staff, consultants, updating family and interview/physical exam; more than 50% of that time was spent in counseling and/or coordination of care.    Aldrich Lloyd J [place] DO [facility] Available via Epic secure chat 7am-7pm After these hours, please refer to coverage provider listed on [email or web address] 05/28/2022, 10:22 AM

## 2022-05-28 NOTE — ED Notes (Signed)
PT cleaned and changed into clean bed sheets, gown and external urinary catheter.

## 2022-05-28 NOTE — ED Notes (Signed)
Moshe Wenger (daughter in law) would like to be called with a status of pt. Phone is [phone]

## 2022-05-28 NOTE — Progress Notes (Signed)
Pt refuses CPAP. States he tried it last night but it was terrible.

## 2022-05-28 NOTE — ED Notes (Signed)
This RN assumed care of patient. Pt remains on CPAP, A&Ox4, NAD noted at this time.

## 2022-05-29 ENCOUNTER — Inpatient Hospital Stay (HOSPITAL_COMMUNITY): Payer: Medicare Other

## 2022-05-29 DIAGNOSIS — R609 Edema, unspecified: Secondary | ICD-10-CM | POA: Diagnosis not present

## 2022-05-29 DIAGNOSIS — M7989 Other specified soft tissue disorders: Secondary | ICD-10-CM | POA: Diagnosis not present

## 2022-05-29 DIAGNOSIS — J9601 Acute respiratory failure with hypoxia: Secondary | ICD-10-CM | POA: Diagnosis not present

## 2022-05-29 LAB — COMPREHENSIVE METABOLIC PANEL
ALT: 28 U/L (ref 0–44)
AST: 36 U/L (ref 15–41)
Albumin: 2.3 g/dL — ABNORMAL LOW (ref 3.5–5.0)
Alkaline Phosphatase: 48 U/L (ref 38–126)
Anion gap: 6 (ref 5–15)
BUN: 111 mg/dL — ABNORMAL HIGH (ref 8–23)
CO2: 22 mmol/L (ref 22–32)
Calcium: 7.9 mg/dL — ABNORMAL LOW (ref 8.9–10.3)
Chloride: 107 mmol/L (ref 98–111)
Creatinine, Ser: 2.7 mg/dL — ABNORMAL HIGH (ref 0.61–1.24)
GFR, Estimated: 22 mL/min — ABNORMAL LOW (ref >60)
Glucose, Bld: 182 mg/dL — ABNORMAL HIGH (ref 70–99)
Potassium: 4.6 mmol/L (ref 3.5–5.1)
Sodium: 135 mmol/L (ref 135–145)
Total Bilirubin: 0.6 mg/dL (ref 0.3–1.2)
Total Protein: 5.5 g/dL — ABNORMAL LOW (ref 6.5–8.1)

## 2022-05-29 LAB — MAGNESIUM: Magnesium: 2.5 mg/dL — ABNORMAL HIGH (ref 1.7–2.4)

## 2022-05-29 LAB — CBC WITH DIFFERENTIAL/PLATELET
Abs Immature Granulocytes: 0.11 10*3/uL — ABNORMAL HIGH (ref 0.00–0.07)
Basophils Absolute: 0 10*3/uL (ref 0.0–0.1)
Basophils Relative: 0 %
Eosinophils Absolute: 0 10*3/uL (ref 0.0–0.5)
Eosinophils Relative: 0 %
HCT: 32.5 % — ABNORMAL LOW (ref 39.0–52.0)
Hemoglobin: 10.5 g/dL — ABNORMAL LOW (ref 13.0–17.0)
Immature Granulocytes: 1 %
Lymphocytes Relative: 6 %
Lymphs Abs: 0.8 10*3/uL (ref 0.7–4.0)
MCH: 29.7 pg (ref 26.0–34.0)
MCHC: 32.3 g/dL (ref 30.0–36.0)
MCV: 92.1 fL (ref 80.0–100.0)
Monocytes Absolute: 0.1 10*3/uL (ref 0.1–1.0)
Monocytes Relative: 1 %
Neutro Abs: 12.4 10*3/uL — ABNORMAL HIGH (ref 1.7–7.7)
Neutrophils Relative %: 92 %
Platelets: 180 10*3/uL (ref 150–400)
RBC: 3.53 MIL/uL — ABNORMAL LOW (ref 4.22–5.81)
RDW: 14.4 % (ref 11.5–15.5)
WBC: 13.5 10*3/uL — ABNORMAL HIGH (ref 4.0–10.5)
nRBC: 0 % (ref 0.0–0.2)

## 2022-05-29 LAB — GLUCOSE, CAPILLARY
Glucose-Capillary: 221 mg/dL — ABNORMAL HIGH (ref 70–99)
Glucose-Capillary: 191 mg/dL — ABNORMAL HIGH (ref 70–99)
Glucose-Capillary: 92 mg/dL (ref 70–99)

## 2022-05-29 LAB — PHOSPHORUS: Phosphorus: 5 mg/dL — ABNORMAL HIGH (ref 2.5–4.6)

## 2022-05-29 LAB — C-REACTIVE PROTEIN: CRP: 7.3 mg/dL — ABNORMAL HIGH (ref <1.0)

## 2022-05-29 LAB — D-DIMER, QUANTITATIVE: D-Dimer, Quant: 0.77 ug/mL-FEU — ABNORMAL HIGH (ref 0.00–0.50)

## 2022-05-29 MED ORDER — PANTOPRAZOLE SODIUM 40 MG PO TBEC
40.0000 mg | DELAYED_RELEASE_TABLET | Freq: Every day | ORAL | Status: DC
  Administered 2022-05-29 – 2022-06-01 (×4): 40 mg via ORAL
  Filled 2022-05-29 (×4): qty 1

## 2022-05-29 MED ORDER — MIDODRINE HCL 5 MG PO TABS
5.0000 mg | ORAL_TABLET | Freq: Three times a day (TID) | ORAL | Status: DC
  Administered 2022-05-29 (×2): 5 mg via ORAL
  Filled 2022-05-29 (×2): qty 1

## 2022-05-29 MED ORDER — MIDODRINE HCL 5 MG PO TABS
10.0000 mg | ORAL_TABLET | Freq: Three times a day (TID) | ORAL | Status: DC
  Administered 2022-05-29 – 2022-06-01 (×9): 10 mg via ORAL
  Filled 2022-05-29 (×10): qty 2

## 2022-05-29 MED ORDER — TECHNETIUM TO 99M ALBUMIN AGGREGATED
4.0000 | Freq: Once | INTRAVENOUS | Status: AC | PRN
  Administered 2022-05-29: 4 via INTRAVENOUS

## 2022-05-29 NOTE — Progress Notes (Signed)
Lower extremity venous duplex has been completed.   Preliminary results in CV Proc.   [name] 05/29/2022 11:52 AM

## 2022-05-29 NOTE — Progress Notes (Signed)
Patient refused 18 units 70/30 insulin and wants to be rechecked in an hour and might take it then.  Patient's own dexcom read at 67 and then 72.  Hospital glucometer read 92.  Patient is currently eating and did not want to have the 18 units insulin administered due to blood sugars being low.  Will have night RN recheck at approx 1930.

## 2022-05-29 NOTE — Progress Notes (Signed)
Patient refused methylprednisolone this morning during my medication pass.  Patient advised that his Family doctor told him not to take steroids.  I explained that this would help his lungs and he advised he still did not want the medicine.

## 2022-05-29 NOTE — Plan of Care (Signed)

## 2022-05-29 NOTE — Progress Notes (Signed)
PROGRESS NOTE    [name]  LXB:[id number] DOB: 19-Jul-1931 DOA: 05/27/2022 PCP: [name], MD    Brief Narrative:   [name] is a 89 y.o. male with past medical history significant for CAD, ischemic cardiomyopathy, combined systolic/diastolic congestive heart failure, CHB s/p PPM, left carotid artery stenosis, CKD stage IV, DM2, hypothyroidism, OSA not on CPAP, history of CVA, who presented to [facility] ED on 1/6 with generalized weakness and syncopal event.  Patient recently diagnosed with COVID-19 viral infection about a week ago, hospitalized at [facility] long and discharged after 2 days of inpatient treatment with steroid taper.  Apparently his blood sugars had been elevated and his primary physician directed patient to discontinue steroids at that time.  Following discharge, patient had been constipated and while attempting to stand up from the toilet felt weak and ended up passing out.  On EMS arrival, patient blood pressure was noted to be 60/40 with repeat 80 over palp.  Patient was noted to be dyspneic oxygenating 90% on room air.  Glucose was 290.  And patient was transported to the ED for further evaluation.  In the ED, temperature 98.2 F, HR 97, RR 17, BP 117/65, SpO2 85% on room air.  WBC 16.5, hemoglobin 13.6, platelets 202.  Sodium 133, potassium 4.8, chloride 97, CO2 22, glucose 295, BUN 128, creatinine 3.25.  AST 22, ALT 23, total bilirubin 0.6.  Lactic acid 1.9.  Procalcitonin 0.96.  Urinalysis unrevealing.  Respiratory viral panel negative.  Chest x-ray with no evidence of acute cardiopulmonary disease process.  Chronic bibasilar atelectasis and asymmetric elevation of the right hemidiaphragm.  CT chest/abdomen/pelvis with bilateral multifocal patchy areas of peribronchovascular groundglass attenuation within the upper and lower lobes, most severe within the posterior medial right lower lobe consistent with Covid-19 pneumonia, no acute findings within the  abdomen or pelvis.  TRH was consulted for admission for further evaluation and management of acute respiratory failure secondary to residual effects of COVID-19 viral pneumonia and hypotension, syncopal event.  Assessment & Plan:   Acute hypoxic respiratory failure, POA COVID-19 viral pneumonia Patient presenting to ED via EMS after syncopal events while using the bathroom with progressive shortness of breath.  Recently diagnosed with COVID-19 and discharged from [facility] on 05/22/2022.  Patient was discharged on steroids that were discontinued by his PCP due to elevated glucose.  Patient was noted to be hypoxic on ED arrival with SpO2 85% and CT chest with findings of groundglass attenuation within the upper and lower lobes consistent with a viral pneumonia; no focal consolidation noted.  Completed 3-day course of IV remdesivir. -- CRP 14.7>11.2>7.3 -- Nuclear medicine VQ scan: Pending -- Solu-Medrol 48.75 mg IV q12h x 3 days followed by prednisone -- Pulmicort neb twice daily -- DuoNeb every 6 hours -- Albuterol neb every 2 hours as needed wheezing/shortness of breath -- Antitussives -- BiPAP as needed, attempt to wean to nasal cannula -- Continue supplemental oxygen, maintain SpO2 > 92%  Positive blood culture 1 out of 6 blood cultures, aerobic only positive for Staphylococcus epidermidis per [facility] ID.  Given patient has a PPM in place, concern for active infection but less likely.  Discussed with ID with recommendations of repeating blood cultures today and holding antibiotics for now. -- Repeat blood cultures x 2 today -- Continue to monitor 3 sets of blood cultures from 1/6  Acute renal failure on CKD stage IV Baseline creatinine 2.1-2.2.  Creatinine elevated on admission of 3.25.  CT abdomen/pelvis on admission with mild bilateral and symmetric renal cortical thinning and perinephric fat stranding with no mass, no nephrolithiasis no hydronephrosis. -- Cr 3.25>3.36>3.17>2.70 --  NS at 50 mL/h x 1 day -- Avoid nephrotoxins, renal dose all medications -- Repeat BMP in a.m.  Lower extremity edema Elevated D-dimer D-dimer elevated 0.95.  Likely in the setting of active infection from a viral pneumonia with inflammation.  Vascular duplex ultrasound bilateral lower extremities negative for DVT. -- Ddimer 0.95>0.77 -- Nuclear medicine VQ scan pending as above -- Monitor on telemetry  Hypotension: Resolved Patient was noted to be hypotensive on EMS arrival.  Received IV fluid hydration and now improved. -- Resumed home metoprolol -- Continue monitor BP closely  Hyponatremia Sodium 133 on admission, likely hypovolemia, natremia in the setting of poor oral intake. -- Na 133>135 -- continue IVF hydration -- BMP daily  Leukocytosis WBC count elevated 16.5 on admission, likely secondary to steroid effect.  Procalcitonin 0.9.  No focal consolidation noted on chest x-ray and CT chest.  No acute abnormalities CT abdomen/pelvis. -- CBC daily  Ischemic cardiomyopathy History of combined systolic/diastolic congestive heart failure, compensated No signs of pulmonary vascular congestion noted on chest x-ray or CT chest.  TTE 05/02/2021 with LVEF 40-45%, LV with mildly decreased function, but overall suboptimal study.  Follows with cardiology outpatient, Dr. Keene Breath -- Discontinued metoprolol succinate due to borderline hypotension -- Continue aspirin, statin, Plavix -- Strict I's and O's Daily weights -- Monitor on telemetry, monitor BP closely  Hx sick sinus syndrome s/p PPM -- Continue monitor on telemetry -- Outpatient follow-up cardiology  CAD -- Aspirin 81 mg p.o. daily -- Plavix 75 mg p.o. daily -- Crestor 40 mg p.o. daily  Hypothyroidism TSH 2.469, within normal limits. -- Levothyroxine 25 mg p.o. daily  Type 2 diabetes mellitus, with hyperglycemia Hemoglobin A1c 9.1 on 03/05/2022, poorly controlled.  Now with elevated glucose likely secondary to steroid  use in the setting of COVID viral pneumonia as above.  Home regimen includes NPH 70/30 55 units every morning, 18 units every afternoon.  Vascular dementia, mild -- Namenda 10 mg p.o. twice daily  Constipation -- MiraLAX daily -- Closely monitor bowel movements  Weakness/deconditioning/gait disturbance: -- PT/OT evaluation in a.m.   DVT prophylaxis: heparin injection 5,000 Units Start: 05/27/22 2200    Code Status: Full Code Family Communication: Updated multiple family members present at bedside.  Disposition Plan:  Level of care: Progressive Status is: Inpatient Remains inpatient appropriate because: IV steroids, repeat blood cultures.  Anticipate possible discharge home in 2-3 days    Consultants:  None  Procedures:  BiPAP Vascular duplex ultrasound bilateral lower extremities Nuclear medicine VQ scan: Pending  Antimicrobials:  None   Subjective: Patient seen examined bedside, resting comfortably in bed.  Updated family members present at bedside.  States breathing is improved when he lays on his left side.  Cannot lay on his back.  Blood cultures 1 out of 6 positive for Staphylococcus epidermidis per [facility] ID.  Discussed with ID, likely contaminant but will recheck blood cultures today.  No other questions or concerns at this time.  Denies headache, no dizziness, no chest pain, no palpitations, no abdominal pain, no focal weakness, no fatigue, no cough/congestion, no fever/chills/night sweats.  No acute events overnight per nursing staff.  Spouse updated by telephone this morning.  Objective: Vitals:   05/29/22 0925 05/29/22 0930 05/29/22 0935 05/29/22 1205  BP: (!) 91/57   (!) 96/56  Pulse: 80 81 68  80  Resp: (!) '22 17 20 14  '$ Temp: 98 F (36.7 C)   98.3 F (36.8 C)  TempSrc: Oral   Oral  SpO2: (!) 88% (!) 89% 93% 95%  Weight:      Height:        Intake/Output Summary (Last 24 hours) at 05/29/2022 1430 Last data filed at 05/29/2022 0600 Gross per 24 hour  Intake  1014.68 ml  Output 1950 ml  Net -935.32 ml   Filed Weights   05/27/22 1524  Weight: 97.8 kg    Examination:  Physical Exam: GEN: NAD, alert, chronically ill appearance, appears older than stated age HEENT: NCAT, PERRL, EOMI, sclera clear, MMM PULM: Breath sounds diminished bilateral bases, normal respiratory effort without accessory muscle use, no crackles, on 2 L nasal cannula with SpO2 91% at rest CV: RRR w/o M/G/R GI: abd soft, NTND, NABS, no R/G/M MSK: + 1 + bilateral peripheral edema, moves all extremity dependently NEURO: No focal deficits appreciated PSYCH: normal mood/affect Integumentary: Chronic venous changes bilateral lower extremities, hyperpigmentation anterior shins, no other concerning rashes/lesions/wounds noted on exposed skin surfaces    Data Reviewed: I have personally reviewed following labs and imaging studies  CBC: Recent Labs  Lab 05/27/22 1526 05/27/22 2230 05/28/22 0640 05/29/22 0348  WBC 16.5* 19.6* 19.5* 13.5*  NEUTROABS 14.3*  --  15.9* 12.4*  HGB 13.6 12.9* 11.3* 10.5*  HCT 40.5 37.1* 33.3* 32.5*  MCV 90.2 90.0 89.8 92.1  PLT 202 197 213 425   Basic Metabolic Panel: Recent Labs  Lab 05/27/22 1526 05/27/22 2230 05/28/22 0640 05/29/22 0348  NA 133*  --  135 135  K 4.8  --  4.1 4.6  CL 97*  --  101 107  CO2 22  --  20* 22  GLUCOSE 295*  --  198* 182*  BUN 128*  --  133* 111*  CREATININE 3.25* 3.36* 3.17* 2.70*  CALCIUM 8.7*  --  8.3* 7.9*  MG 2.5*  --  2.6* 2.5*  PHOS  --   --  4.5 5.0*   GFR: Estimated Creatinine Clearance: 21.1 mL/min (A) (by C-G formula based on SCr of 2.7 mg/dL (H)). Liver Function Tests: Recent Labs  Lab 05/27/22 1526 05/28/22 0640 05/29/22 0348  AST 22 24 36  ALT '23 21 28  '$ ALKPHOS 53 48 48  BILITOT 0.6 0.7 0.6  PROT 6.1* 5.4* 5.5*  ALBUMIN 2.6* 2.4* 2.3*   No results for input(s): "LIPASE", "AMYLASE" in the last 168 hours. No results for input(s): "AMMONIA" in the last 168 hours. Coagulation  Profile: Recent Labs  Lab 05/27/22 1526  INR 1.3*   Cardiac Enzymes: No results for input(s): "CKTOTAL", "CKMB", "CKMBINDEX", "TROPONINI" in the last 168 hours. BNP (last 3 results) No results for input(s): "PROBNP" in the last 8760 hours. HbA1C: No results for input(s): "HGBA1C" in the last 72 hours. CBG: Recent Labs  Lab 05/28/22 1330 05/28/22 1732 05/28/22 2211 05/29/22 0924 05/29/22 1209  GLUCAP 163* 159* 106* 221* 191*   Lipid Profile: No results for input(s): "CHOL", "HDL", "LDLCALC", "TRIG", "CHOLHDL", "LDLDIRECT" in the last 72 hours. Thyroid Function Tests: No results for input(s): "TSH", "T4TOTAL", "FREET4", "T3FREE", "THYROIDAB" in the last 72 hours. Anemia Panel: Recent Labs    05/27/22 2230 05/28/22 0640  FERRITIN 392* 400*   Sepsis Labs: Recent Labs  Lab 05/27/22 1526 05/28/22 0200  PROCALCITON  --  0.96  LATICACIDVEN 1.9  --     Recent Results (from the past 240 hour(s))  Resp panel by RT-PCR (RSV, Flu A&B, Covid) Anterior Nasal Swab     Status: Abnormal   Collection Time: 05/21/22  1:59 PM   Specimen: Anterior Nasal Swab  Result Value Ref Range Status   SARS Coronavirus 2 by RT PCR POSITIVE (A) NEGATIVE Final    Comment: (NOTE) SARS-CoV-2 target nucleic acids are DETECTED.  The SARS-CoV-2 RNA is generally detectable in upper respiratory specimens during the acute phase of infection. Positive results are indicative of the presence of the identified virus, but do not rule out bacterial infection or co-infection with other pathogens not detected by the test. Clinical correlation with patient history and other diagnostic information is necessary to determine patient infection status. The expected result is Negative.  Fact Sheet for Patients: EntrepreneurPulse.com.au  Fact Sheet for Healthcare Providers: IncredibleEmployment.be  This test is not yet approved or cleared by the [place] FDA and  has  been authorized for detection and/or diagnosis of SARS-CoV-2 by FDA under an Emergency Use Authorization (EUA).  This EUA will remain in effect (meaning this test can be used) for the duration of  the COVID-19 declaration under Section 564(b)(1) of the A ct, 21 U.S.C. section 360bbb-3(b)(1), unless the authorization is terminated or revoked sooner.     Influenza A by PCR NEGATIVE NEGATIVE Final   Influenza B by PCR NEGATIVE NEGATIVE Final    Comment: (NOTE) The Xpert Xpress SARS-CoV-2/FLU/RSV plus assay is intended as an aid in the diagnosis of influenza from Nasopharyngeal swab specimens and should not be used as a sole basis for treatment. Nasal washings and aspirates are unacceptable for Xpert Xpress SARS-CoV-2/FLU/RSV testing.  Fact Sheet for Patients: EntrepreneurPulse.com.au  Fact Sheet for Healthcare Providers: IncredibleEmployment.be  This test is not yet approved or cleared by the [place] FDA and has been authorized for detection and/or diagnosis of SARS-CoV-2 by FDA under an Emergency Use Authorization (EUA). This EUA will remain in effect (meaning this test can be used) for the duration of the COVID-19 declaration under Section 564(b)(1) of the Act, 21 U.S.C. section 360bbb-3(b)(1), unless the authorization is terminated or revoked.     Resp Syncytial Virus by PCR NEGATIVE NEGATIVE Final    Comment: (NOTE) Fact Sheet for Patients: EntrepreneurPulse.com.au  Fact Sheet for Healthcare Providers: IncredibleEmployment.be  This test is not yet approved or cleared by the [place] FDA and has been authorized for detection and/or diagnosis of SARS-CoV-2 by FDA under an Emergency Use Authorization (EUA). This EUA will remain in effect (meaning this test can be used) for the duration of the COVID-19 declaration under Section 564(b)(1) of the Act, 21 U.S.C. section 360bbb-3(b)(1), unless the  authorization is terminated or revoked.  Performed at [facility], [place] [address], [place], [place] [address]   Culture, blood (Routine x 2)     Status: None (Preliminary result)   Collection Time: 05/27/22  3:26 PM   Specimen: BLOOD RIGHT ARM  Result Value Ref Range Status   Specimen Description BLOOD RIGHT ARM  Final   Special Requests   Final    BOTTLES DRAWN AEROBIC AND ANAEROBIC Blood Culture results may not be optimal due to an inadequate volume of blood received in culture bottles   Culture   Final    NO GROWTH 2 DAYS Performed at [facility], [place] [address] [place], [place] [address]    Report Status PENDING  Incomplete  Culture, blood (Routine x 2)     Status: None (Preliminary result)  Collection Time: 05/27/22  3:31 PM   Specimen: BLOOD LEFT ARM  Result Value Ref Range Status   Specimen Description BLOOD LEFT ARM  Final   Special Requests   Final    BOTTLES DRAWN AEROBIC AND ANAEROBIC Blood Culture adequate volume   Culture   Final    NO GROWTH 2 DAYS Performed at [facility], [address], [place], [place] [address]    Report Status PENDING  Incomplete  Respiratory (~20 pathogens) panel by PCR     Status: None   Collection Time: 05/27/22  9:21 PM   Specimen: Nasopharyngeal Swab; Respiratory  Result Value Ref Range Status   Adenovirus NOT DETECTED NOT DETECTED Final   Coronavirus 229E NOT DETECTED NOT DETECTED Final    Comment: (NOTE) The Coronavirus on the Respiratory Panel, DOES NOT test for the novel  Coronavirus (2019 nCoV)    Coronavirus HKU1 NOT DETECTED NOT DETECTED Final   Coronavirus NL63 NOT DETECTED NOT DETECTED Final   Coronavirus OC43 NOT DETECTED NOT DETECTED Final   Metapneumovirus NOT DETECTED NOT DETECTED Final   Rhinovirus / Enterovirus NOT DETECTED NOT DETECTED Final   Influenza A NOT DETECTED NOT DETECTED Final   Influenza B NOT DETECTED NOT DETECTED Final   Parainfluenza Virus 1 NOT DETECTED NOT  DETECTED Final   Parainfluenza Virus 2 NOT DETECTED NOT DETECTED Final   Parainfluenza Virus 3 NOT DETECTED NOT DETECTED Final   Parainfluenza Virus 4 NOT DETECTED NOT DETECTED Final   Respiratory Syncytial Virus NOT DETECTED NOT DETECTED Final   Bordetella pertussis NOT DETECTED NOT DETECTED Final   Bordetella Parapertussis NOT DETECTED NOT DETECTED Final   Chlamydophila pneumoniae NOT DETECTED NOT DETECTED Final   Mycoplasma pneumoniae NOT DETECTED NOT DETECTED Final    Comment: Performed at [facility], [place]. [address], [place], [place] [address]  Culture, blood (Routine X 2) w Reflex to ID Panel     Status: None (Preliminary result)   Collection Time: 05/27/22 10:30 PM   Specimen: BLOOD  Result Value Ref Range Status   Specimen Description BLOOD RIGHT ANTECUBITAL  Final   Special Requests   Final    BOTTLES DRAWN AEROBIC AND ANAEROBIC Blood Culture adequate volume   Culture  Setup Time   Final    AEROBIC BOTTLE ONLY GRAM POSITIVE COCCI IN CLUSTERS CRITICAL VALUE NOTED.  VALUE IS CONSISTENT WITH PREVIOUSLY REPORTED AND CALLED VALUE.    Culture   Final    CULTURE REINCUBATED FOR BETTER GROWTH TOO YOUNG TO READ Performed at [facility], [place] [address] [place], [place] [address]    Report Status PENDING  Incomplete  Culture, blood (Routine X 2) w Reflex to ID Panel     Status: None (Preliminary result)   Collection Time: 05/27/22 10:30 PM   Specimen: BLOOD LEFT HAND  Result Value Ref Range Status   Specimen Description BLOOD LEFT HAND  Final   Special Requests   Final    BOTTLES DRAWN AEROBIC AND ANAEROBIC Blood Culture adequate volume   Culture  Setup Time   Final    AEROBIC BOTTLE ONLY GRAM POSITIVE COCCI IN CLUSTERS CRITICAL RESULT CALLED TO, READ BACK BY AND VERIFIED WITH:  C/ PHARMD L. CURRAN 05/28/22 1639 A. LAFRANCE    Culture   Final    CULTURE REINCUBATED FOR BETTER GROWTH TOO YOUNG TO READ Performed at [facility], [place] [address],  [place], [place] [address]    Report Status PENDING  Incomplete  Blood Culture ID Panel (Reflexed)     Status: Abnormal   Collection Time: 05/27/22 10:30 PM  Result Value Ref Range Status   Enterococcus faecalis NOT DETECTED NOT DETECTED Final   Enterococcus Faecium NOT DETECTED NOT DETECTED Final   Listeria monocytogenes NOT DETECTED NOT DETECTED Final   Staphylococcus species DETECTED (A) NOT DETECTED Final    Comment: CRITICAL RESULT CALLED TO, READ BACK BY AND VERIFIED WITH:  C/ PHARMD L. CURRAN 05/28/22 1639 A. LAFRANCE    Staphylococcus aureus (BCID) NOT DETECTED NOT DETECTED Final   Staphylococcus epidermidis DETECTED (A) NOT DETECTED Final    Comment: Methicillin (oxacillin) resistant coagulase negative staphylococcus. Possible blood culture contaminant (unless isolated from more than one blood culture draw or clinical case suggests pathogenicity). No antibiotic treatment is indicated for blood  culture contaminants. CRITICAL RESULT CALLED TO, READ BACK BY AND VERIFIED WITH:  C/ PHARMD L. CURRAN 05/28/22 1639 A. LAFRANCE    Staphylococcus lugdunensis NOT DETECTED NOT DETECTED Final   Streptococcus species NOT DETECTED NOT DETECTED Final   Streptococcus agalactiae NOT DETECTED NOT DETECTED Final   Streptococcus pneumoniae NOT DETECTED NOT DETECTED Final   Streptococcus pyogenes NOT DETECTED NOT DETECTED Final   A.calcoaceticus-baumannii NOT DETECTED NOT DETECTED Final   Bacteroides fragilis NOT DETECTED NOT DETECTED Final   Enterobacterales NOT DETECTED NOT DETECTED Final   Enterobacter cloacae complex NOT DETECTED NOT DETECTED Final   Escherichia coli NOT DETECTED NOT DETECTED Final   Klebsiella aerogenes NOT DETECTED NOT DETECTED Final   Klebsiella oxytoca NOT DETECTED NOT DETECTED Final   Klebsiella pneumoniae NOT DETECTED NOT DETECTED Final   Proteus species NOT DETECTED NOT DETECTED Final   Salmonella species NOT DETECTED NOT DETECTED Final   Serratia marcescens NOT DETECTED  NOT DETECTED Final   Haemophilus influenzae NOT DETECTED NOT DETECTED Final   Neisseria meningitidis NOT DETECTED NOT DETECTED Final   Pseudomonas aeruginosa NOT DETECTED NOT DETECTED Final   Stenotrophomonas maltophilia NOT DETECTED NOT DETECTED Final   Candida albicans NOT DETECTED NOT DETECTED Final   Candida auris NOT DETECTED NOT DETECTED Final   Candida glabrata NOT DETECTED NOT DETECTED Final   Candida krusei NOT DETECTED NOT DETECTED Final   Candida parapsilosis NOT DETECTED NOT DETECTED Final   Candida tropicalis NOT DETECTED NOT DETECTED Final   Cryptococcus neoformans/gattii NOT DETECTED NOT DETECTED Final   Methicillin resistance mecA/C DETECTED (A) NOT DETECTED Final    Comment: CRITICAL RESULT CALLED TO, READ BACK BY AND VERIFIED WITH:  C/ PHARMD L. CURRAN 05/28/22 1639 A. LAFRANCE Performed at [facility], [place] [address], [place], [place] [address]          Radiology Studies: VAS [place] LOWER EXTREMITY VENOUS (DVT) (7a-7p)  Result Date: 05/29/2022  Lower Venous DVT Study Patient Name:  [name]  Date of Exam:   05/29/2022 Medical Rec #: [id number]       Accession #:    [id number] Date of Birth: June 14, 1931       Patient Gender: M Patient Age:   28 years Exam Location:  [facility] Procedure:      VAS [place] LOWER EXTREMITY VENOUS (DVT) Referring Phys: Herbie Baltimore LOCKWOOD --------------------------------------------------------------------------------  Indications: Swelling, and Edema.  Comparison Study: no prior Performing Technologist: Archie Patten RVS  Examination Guidelines: A complete evaluation includes B-mode imaging, spectral Doppler, color Doppler, and power Doppler as needed of all accessible portions of each vessel. Bilateral testing is considered an integral part of a complete  examination. Limited examinations for reoccurring indications may be performed as noted. The reflux portion of the exam is performed with the patient in reverse Trendelenburg.   +---------+---------------+---------+-----------+----------+--------------+ RIGHT    CompressibilityPhasicitySpontaneityPropertiesThrombus Aging +---------+---------------+---------+-----------+----------+--------------+ CFV      Full           Yes      Yes                                 +---------+---------------+---------+-----------+----------+--------------+ SFJ      Full                                                        +---------+---------------+---------+-----------+----------+--------------+ FV Prox  Full                                                        +---------+---------------+---------+-----------+----------+--------------+ FV Mid   Full                                                        +---------+---------------+---------+-----------+----------+--------------+ FV DistalFull                                                        +---------+---------------+---------+-----------+----------+--------------+ PFV      Full                                                        +---------+---------------+---------+-----------+----------+--------------+ POP      Full           Yes      Yes                                 +---------+---------------+---------+-----------+----------+--------------+ PTV      Full                                                        +---------+---------------+---------+-----------+----------+--------------+ PERO     Full           Yes      Yes                                 +---------+---------------+---------+-----------+----------+--------------+   +---------+---------------+---------+-----------+----------+--------------+ LEFT     CompressibilityPhasicitySpontaneityPropertiesThrombus Aging +---------+---------------+---------+-----------+----------+--------------+ CFV      Full  Yes      Yes                                  +---------+---------------+---------+-----------+----------+--------------+ SFJ      Full                                                        +---------+---------------+---------+-----------+----------+--------------+ FV Prox  Full                                                        +---------+---------------+---------+-----------+----------+--------------+ FV Mid   Full                                                        +---------+---------------+---------+-----------+----------+--------------+ FV DistalFull                                                        +---------+---------------+---------+-----------+----------+--------------+ PFV      Full                                                        +---------+---------------+---------+-----------+----------+--------------+ POP      Full           Yes      Yes                                 +---------+---------------+---------+-----------+----------+--------------+ PTV      Full                                                        +---------+---------------+---------+-----------+----------+--------------+ PERO     Full                                                        +---------+---------------+---------+-----------+----------+--------------+     Summary: BILATERAL: - No evidence of deep vein thrombosis seen in the lower extremities, bilaterally. -No evidence of popliteal cyst, bilaterally.   *See table(s) above for measurements and observations.    Preliminary    DG CHEST PORT 1 VIEW  Result Date: 05/29/2022 CLINICAL DATA:  Respiratory failure EXAM: PORTABLE CHEST 1 VIEW COMPARISON:  05/27/2022 FINDINGS: Right-sided implanted cardiac device. Stable heart size. Aortic  atherosclerosis. Aeration of the lung fields appears improved compared to the previous CT. No lobar consolidation is evident radiographically. No pleural effusion or pneumothorax. IMPRESSION: Improved aeration of the  lung fields compared to the previous CT. Electronically Signed   By: Davina Poke D.O.   On: 05/29/2022 10:18   CT CHEST ABDOMEN PELVIS WO CONTRAST  Result Date: 05/27/2022 CLINICAL DATA:  Shortness of breath with diffuse abdominal pain EXAM: CT CHEST, ABDOMEN AND PELVIS WITHOUT CONTRAST TECHNIQUE: Multidetector CT imaging of the chest, abdomen and pelvis was performed following the standard protocol without IV contrast. RADIATION DOSE REDUCTION: This exam was performed according to the departmental dose-optimization program which includes automated exposure control, adjustment of the mA and/or kV according to patient size and/or use of iterative reconstruction technique. COMPARISON:  CT AP 10/01/2021 FINDINGS: CT CHEST FINDINGS Cardiovascular: Normal heart size. Aortic atherosclerosis and coronary artery calcifications. Right chest wall pacer device noted with leads in the right atrial appendage and right ventricle. No pericardial effusion. Mediastinum/Nodes: Thyroid gland, trachea, and esophagus demonstrate no significant findings. No enlarged axillary or mediastinal lymph nodes. Lungs/Pleura: No pleural fluid. Bilateral multifocal patchy areas of peribronchovascular ground-glass attenuation identified within the upper and lower lobes. This is most severe within the posterior and medial right lower lobe, image 82/5. No airspace consolidation Musculoskeletal: No chest wall mass or suspicious bone lesions identified. CT ABDOMEN PELVIS FINDINGS Hepatobiliary: No focal liver abnormality identified. Multiple gallstones are identified which measure up to 5 mm. No gallbladder wall thickening or pericholecystic fluid. Pancreas: No main duct dilatation, inflammation or mass. Scattered parenchymal calcifications identified. Spleen: Normal in size without focal abnormality. Adrenals/Urinary Tract: Normal appearance of the adrenal glands. Mild bilateral and symmetric renal cortical thinning and perinephric fat  stranding. No mass. No nephrolithiasis or hydronephrosis. No signs of hydroureter or ureteral lithiasis. Bladder is unremarkable. Stomach/Bowel: Stomach is within normal limits. Appendix appears normal. No evidence of bowel wall thickening, distention, or inflammatory changes. Vascular/Lymphatic: Aortic atherosclerosis. No abdominal adenopathy. Left perirectal lymph node is identified measuring 0.9 cm, image 100/6. Unchanged when compared with the previous exam. Reproductive: Seed implants noted within the prostate gland. Other: No free fluid or fluid collections. No signs of pneumoperitoneum. Fat containing umbilical hernia. Musculoskeletal: Multilevel spondylosis throughout the lumbar spine. No acute or suspicious osseous abnormality. IMPRESSION: 1. Bilateral multifocal patchy areas of peribronchovascular ground-glass attenuation identified within the upper and lower lobes. This is most severe within the posterior and medial right lower lobe. Imaging features can be seen with (COVID-19) pneumonia, though are nonspecific and can occur with a variety of infectious and noninfectious processes. 2. No acute findings within the abdomen or pelvis. 3. Unchanged upper limits of normal left perirectal lymph node. 4. Gallstones. 5. Fat containing umbilical hernia. 6.  Aortic Atherosclerosis (ICD10-I70.0). Electronically Signed   By: Kerby Moors M.D.   On: 05/27/2022 18:27   DG Chest 2 View  Result Date: 05/27/2022 CLINICAL DATA:  Fatigue with shortness of breath for a couple days. Suspected sepsis. EXAM: CHEST - 2 VIEW COMPARISON:  Radiographs 05/21/2022 and 03/04/2022. FINDINGS: Three views are submitted, limited by body habitus and suboptimal inspiration. Right subclavian pacemaker leads appear unchanged, projecting over the right atrium and right ventricle. Stable mild cardiomegaly. Stable asymmetric elevation of the right hemidiaphragm with associated chronic bibasilar atelectasis. No definite superimposed airspace  disease, edema, significant pleural effusion or pneumothorax. No acute osseous findings are evident. Telemetry leads overlie the chest. IMPRESSION: No evidence of acute cardiopulmonary process. Chronic bibasilar  atelectasis and asymmetric elevation of the right hemidiaphragm. Electronically Signed   By: Richardean Sale M.D.   On: 05/27/2022 16:52        Scheduled Meds:  acetaminophen  500 mg Oral Daily   ascorbic acid  500 mg Oral Daily   aspirin EC  81 mg Oral QPM   budesonide (PULMICORT) nebulizer solution  0.5 mg Nebulization BID   clopidogrel  75 mg Oral Daily   docusate sodium  100 mg Oral Daily   heparin  5,000 Units Subcutaneous Q8H   insulin aspart  0-9 Units Subcutaneous TID WC   insulin aspart protamine- aspart  18 Units Subcutaneous QPM   insulin aspart protamine- aspart  55 Units Subcutaneous q morning   levothyroxine  25 mcg Oral Q0600   memantine  10 mg Oral BID   methylPREDNISolone (SOLU-MEDROL) injection  0.5 mg/kg Intravenous Q12H   Followed by   Derrill Memo ON 05/30/2022] predniSONE  50 mg Oral Daily   midodrine  5 mg Oral TID WC   pantoprazole (PROTONIX) IV  40 mg Intravenous QHS   polyethylene glycol  17 g Oral Daily   rosuvastatin  40 mg Oral Daily   zinc sulfate  220 mg Oral Daily   Continuous Infusions:     LOS: 2 days    Time spent: 52 minutes spent on chart review, discussion with nursing staff, consultants, updating family and interview/physical exam; more than 50% of that time was spent in counseling and/or coordination of care.    Freman Lapage J [place] DO [facility] Available via Epic secure chat 7am-7pm After these hours, please refer to coverage provider listed on [email or web address] 05/29/2022, 2:30 PM

## 2022-05-29 NOTE — Progress Notes (Signed)
PT refusing cpap for the night.

## 2022-05-30 DIAGNOSIS — J9601 Acute respiratory failure with hypoxia: Secondary | ICD-10-CM | POA: Diagnosis not present

## 2022-05-30 LAB — CBC WITH DIFFERENTIAL/PLATELET
Abs Immature Granulocytes: 0.14 10*3/uL — ABNORMAL HIGH (ref 0.00–0.07)
Basophils Absolute: 0 10*3/uL (ref 0.0–0.1)
Basophils Relative: 0 %
Eosinophils Absolute: 0 10*3/uL (ref 0.0–0.5)
Eosinophils Relative: 0 %
HCT: 29.8 % — ABNORMAL LOW (ref 39.0–52.0)
Hemoglobin: 9.8 g/dL — ABNORMAL LOW (ref 13.0–17.0)
Immature Granulocytes: 1 %
Lymphocytes Relative: 9 %
Lymphs Abs: 1.2 10*3/uL (ref 0.7–4.0)
MCH: 30.4 pg (ref 26.0–34.0)
MCHC: 32.9 g/dL (ref 30.0–36.0)
MCV: 92.5 fL (ref 80.0–100.0)
Monocytes Absolute: 0.1 10*3/uL (ref 0.1–1.0)
Monocytes Relative: 1 %
Neutro Abs: 11.6 10*3/uL — ABNORMAL HIGH (ref 1.7–7.7)
Neutrophils Relative %: 89 %
Platelets: 183 10*3/uL (ref 150–400)
RBC: 3.22 MIL/uL — ABNORMAL LOW (ref 4.22–5.81)
RDW: 14.6 % (ref 11.5–15.5)
WBC: 13.1 10*3/uL — ABNORMAL HIGH (ref 4.0–10.5)
nRBC: 0 % (ref 0.0–0.2)

## 2022-05-30 LAB — COMPREHENSIVE METABOLIC PANEL
ALT: 27 U/L (ref 0–44)
AST: 37 U/L (ref 15–41)
Albumin: 2.4 g/dL — ABNORMAL LOW (ref 3.5–5.0)
Alkaline Phosphatase: 44 U/L (ref 38–126)
Anion gap: 11 (ref 5–15)
BUN: 93 mg/dL — ABNORMAL HIGH (ref 8–23)
CO2: 21 mmol/L — ABNORMAL LOW (ref 22–32)
Calcium: 8.1 mg/dL — ABNORMAL LOW (ref 8.9–10.3)
Chloride: 107 mmol/L (ref 98–111)
Creatinine, Ser: 2.63 mg/dL — ABNORMAL HIGH (ref 0.61–1.24)
GFR, Estimated: 23 mL/min — ABNORMAL LOW (ref >60)
Glucose, Bld: 147 mg/dL — ABNORMAL HIGH (ref 70–99)
Potassium: 4.6 mmol/L (ref 3.5–5.1)
Sodium: 139 mmol/L (ref 135–145)
Total Bilirubin: 0.6 mg/dL (ref 0.3–1.2)
Total Protein: 5.4 g/dL — ABNORMAL LOW (ref 6.5–8.1)

## 2022-05-30 LAB — PHOSPHORUS: Phosphorus: 5.7 mg/dL — ABNORMAL HIGH (ref 2.5–4.6)

## 2022-05-30 LAB — D-DIMER, QUANTITATIVE: D-Dimer, Quant: 0.69 ug/mL-FEU — ABNORMAL HIGH (ref 0.00–0.50)

## 2022-05-30 LAB — GLUCOSE, CAPILLARY
Glucose-Capillary: 159 mg/dL — ABNORMAL HIGH (ref 70–99)
Glucose-Capillary: 224 mg/dL — ABNORMAL HIGH (ref 70–99)
Glucose-Capillary: 230 mg/dL — ABNORMAL HIGH (ref 70–99)
Glucose-Capillary: 180 mg/dL — ABNORMAL HIGH (ref 70–99)

## 2022-05-30 LAB — C-REACTIVE PROTEIN: CRP: 5 mg/dL — ABNORMAL HIGH (ref <1.0)

## 2022-05-30 LAB — MAGNESIUM: Magnesium: 2.6 mg/dL — ABNORMAL HIGH (ref 1.7–2.4)

## 2022-05-30 MED ORDER — SODIUM CHLORIDE 0.9 % IV SOLN: INTRAVENOUS | Status: DC

## 2022-05-30 MED ORDER — POLYVINYL ALCOHOL 1.4 % OP SOLN: 1.0000 [drp] | OPHTHALMIC | Status: DC | PRN

## 2022-05-30 NOTE — Evaluation (Signed)
Physical Therapy Evaluation Patient Details Name: [name] MRN: [id number] DOB: [birth year] Today's Date: 05/30/2022  History of Present Illness  89 yo male presents to [facility] on 1/6 with diagnosis of covid with x1 week of progressive weakness, hypotension, syncope. CT chest shows bilateral multifocal patchy areas of peribronchovascular ground-glass attenuation identified within the upper and lower Lobes consistent with PNA. Recent admission 12/31-1/1 for covid PNA. PMH includes ARF, CA, DM with neuropathy, pacemaker, TIA, CAD, CKD IV, HTN, OSA, DJD, mixed alzheimers and vascular dementia.  Clinical Impression   Pt presents with impaired strength, impaired balance, dyspnea on exertion with accompanying O2 desaturation, and impaired activity tolerance. Pt to benefit from acute PT to address deficits. Pt ambulated short room distance, requiring seated rest to recover dyspnea on spO2 drop to 83% on RA, placed on 2LO2 for recovery to 90s. Pt lives with multiple generations in home, states he has 24/7 assist. PT recommendations below. PT to progress mobility as tolerated, and will continue to follow acutely.         Recommendations for follow up therapy are one component of a multi-disciplinary discharge planning process, led by the attending physician.  Recommendations may be updated based on patient status, additional functional criteria and insurance authorization.  Follow Up Recommendations Home health PT      Assistance Recommended at Discharge Intermittent Supervision/Assistance  Patient can return home with the following  A little help with walking and/or transfers;A little help with bathing/dressing/bathroom    Equipment Recommendations None recommended by PT  Recommendations for Other Services       Functional Status Assessment Patient has had a recent decline in their functional status and demonstrates the ability to make significant improvements in function in a reasonable and  predictable amount of time.     Precautions / Restrictions Precautions Precautions: Fall;ICD/Pacemaker Precaution Comments: watch O2 - on 2LO2 Restrictions Weight Bearing Restrictions: No      Mobility  Bed Mobility Overal bed mobility: Needs Assistance             General bed mobility comments: sitting EOB upon PT arrival    Transfers Overall transfer level: Needs assistance Equipment used: Rolling walker (2 wheels) Transfers: Sit to/from Stand Sit to Stand: Min assist           General transfer comment: min assist for power up and steady, STS x2 from EOB    Ambulation/Gait Ambulation/Gait assistance: Min guard Gait Distance (Feet): 20 Feet (x2 with 2 min seated rest) Assistive device: Rolling walker (2 wheels) Gait Pattern/deviations: Step-through pattern, Decreased stride length, Trunk flexed Gait velocity: decr     General Gait Details: cues for upright posture, placement in RW  Stairs            Wheelchair Mobility    Modified Rankin (Stroke Patients Only)       Balance Overall balance assessment: Needs assistance Sitting-balance support: No upper extremity supported, Feet supported Sitting balance-Leahy Scale: Good     Standing balance support: Bilateral upper extremity supported, During functional activity, Reliant on assistive device for balance Standing balance-Leahy Scale: Poor Standing balance comment: reaching for environment without RW                             Pertinent Vitals/Pain Pain Assessment Pain Assessment: No/denies pain    Home Living Family/patient expects to be discharged to:: Private residence Living Arrangements: Spouse/significant other;Children;Other relatives Available Help at Discharge: Family;Available 24  hours/day Type of Home: House Home Access: Level entry       Home Layout: Multi-level;Able to live on main level with bedroom/bathroom Home Equipment: Rollator (4 wheels);Grab bars -  tub/shower;Grab bars - toilet;Cane - single point      Prior Function Prior Level of Function : Independent/Modified Independent             Mobility Comments: pt ambulates with rollator, states he has been struggling to ambulate over the past 2 weeks ADLs Comments: indep     Hand Dominance   Dominant Hand: Right    Extremity/Trunk Assessment   Upper Extremity Assessment Upper Extremity Assessment: Defer to OT evaluation    Lower Extremity Assessment Lower Extremity Assessment: Generalized weakness;RLE deficits/detail;LLE deficits/detail RLE Sensation: history of peripheral neuropathy LLE Sensation: history of peripheral neuropathy    Cervical / Trunk Assessment Cervical / Trunk Assessment: Kyphotic  Communication   Communication: HOH  Cognition Arousal/Alertness: Awake/alert Behavior During Therapy: WFL for tasks assessed/performed Overall Cognitive Status: History of cognitive impairments - at baseline                                 General Comments: history of dementia, A&Ox4 and follows commands well        General Comments General comments (skin integrity, edema, etc.): 2LO2 to maintain SpO2 >88%, dropped to 83% on RA    Exercises     Assessment/Plan    PT Assessment Patient needs continued PT services  PT Problem List Decreased strength;Decreased activity tolerance;Decreased balance;Decreased mobility;Obesity;Cardiopulmonary status limiting activity       PT Treatment Interventions DME instruction;Therapeutic activities;Gait training;Therapeutic exercise;Patient/family education;Balance training;Functional mobility training;Neuromuscular re-education    PT Goals (Current goals can be found in the Care Plan section)  Acute Rehab PT Goals Patient Stated Goal: walk more PT Goal Formulation: With patient Time For Goal Achievement: 06/13/22 Potential to Achieve Goals: Good    Frequency Min 3X/week     Co-evaluation                AM-PAC PT "6 Clicks" Mobility  Outcome Measure Help needed turning from your back to your side while in a flat bed without using bedrails?: None Help needed moving from lying on your back to sitting on the side of a flat bed without using bedrails?: A Little Help needed moving to and from a bed to a chair (including a wheelchair)?: A Little Help needed standing up from a chair using your arms (e.g., wheelchair or bedside chair)?: A Little Help needed to walk in hospital room?: A Little Help needed climbing 3-5 steps with a railing? : A Little 6 Click Score: 19    End of Session Equipment Utilized During Treatment: Oxygen Activity Tolerance: Patient tolerated treatment well Patient left: in bed;with call bell/phone within reach (bed alarm off upon PT arrival to room, left off at PT exit) Nurse Communication: Mobility status PT Visit Diagnosis: Other abnormalities of gait and mobility (R26.89)    Time: 2778-2423 PT Time Calculation (min) (ACUTE ONLY): 20 min   Charges:   PT Evaluation $PT Eval Low Complexity: 1 Low         Wylene Weissman S, PT DPT Acute Rehabilitation Services Pager [phone]  Office [phone]   Jnai Snellgrove E Ruffin Pyo 05/30/2022, 4:02 PM

## 2022-05-30 NOTE — Progress Notes (Signed)
PROGRESS NOTE    [name]  GYK:[id number] DOB: May 27, 1931 DOA: 05/27/2022 PCP: [name], MD    Brief Narrative:   [name] is a 89 y.o. male with past medical history significant for CAD, ischemic cardiomyopathy, combined systolic/diastolic congestive heart failure, CHB s/p PPM, left carotid artery stenosis, CKD stage IV, DM2, hypothyroidism, OSA not on CPAP, history of CVA, who presented to [facility] ED on 1/6 with generalized weakness and syncopal event.  Patient recently diagnosed with COVID-19 viral infection about a week ago, hospitalized at [facility] long and discharged after 2 days of inpatient treatment with steroid taper.  Apparently his blood sugars had been elevated and his primary physician directed patient to discontinue steroids at that time.  Following discharge, patient had been constipated and while attempting to stand up from the toilet felt weak and ended up passing out.  On EMS arrival, patient blood pressure was noted to be 60/40 with repeat 80 over palp.  Patient was noted to be dyspneic oxygenating 90% on room air.  Glucose was 290.  And patient was transported to the ED for further evaluation.  In the ED, temperature 98.2 F, HR 97, RR 17, BP 117/65, SpO2 85% on room air.  WBC 16.5, hemoglobin 13.6, platelets 202.  Sodium 133, potassium 4.8, chloride 97, CO2 22, glucose 295, BUN 128, creatinine 3.25.  AST 22, ALT 23, total bilirubin 0.6.  Lactic acid 1.9.  Procalcitonin 0.96.  Urinalysis unrevealing.  Respiratory viral panel negative.  Chest x-ray with no evidence of acute cardiopulmonary disease process.  Chronic bibasilar atelectasis and asymmetric elevation of the right hemidiaphragm.  CT chest/abdomen/pelvis with bilateral multifocal patchy areas of peribronchovascular groundglass attenuation within the upper and lower lobes, most severe within the posterior medial right lower lobe consistent with Covid-19 pneumonia, no acute findings within the  abdomen or pelvis.  TRH was consulted for admission for further evaluation and management of acute respiratory failure secondary to residual effects of COVID-19 viral pneumonia and hypotension, syncopal event.  Assessment & Plan:   Acute hypoxic respiratory failure, POA COVID-19 viral pneumonia Patient presenting to ED via EMS after syncopal events while using the bathroom with progressive shortness of breath.  Recently diagnosed with COVID-19 and discharged from [facility] on 05/22/2022.  Patient was discharged on steroids that were discontinued by his PCP due to elevated glucose.  Patient was noted to be hypoxic on ED arrival with SpO2 85% and CT chest with findings of groundglass attenuation within the upper and lower lobes consistent with a viral pneumonia; no focal consolidation noted.  Completed 3-day course of IV remdesivir.  Nuclear medicine VQ scan with normal perfusion. -- CRP 14.7>11.2>7.3>5.0 -- Solu-Medrol transitioned to prednisone 50 mg p.o. daily today -- Pulmicort neb twice daily -- DuoNeb every 6 hours -- Albuterol neb every 2 hours as needed wheezing/shortness of breath -- Antitussives -- BiPAP as needed, attempt to wean to nasal cannula -- Continue supplemental oxygen, maintain SpO2 > 92% -- ambulatory O2 screen today  Positive blood culture 1 out of 6 blood cultures, aerobic only positive for Staphylococcus epidermidis per [facility] ID.  Given patient has a PPM in place, concern for active infection but less likely.  Discussed with ID with recommendations of repeating blood cultures today and holding antibiotics for now. -- Repeat blood cultures x 2 1/8: Pending -- Continue to monitor 3 sets of blood cultures from 1/6  Acute renal failure on CKD stage IV Baseline creatinine 2.1-2.2.  Creatinine  elevated on admission of 3.25.  CT abdomen/pelvis on admission with mild bilateral and symmetric renal cortical thinning and perinephric fat stranding with no mass, no  nephrolithiasis no hydronephrosis. -- Cr 3.25>3.36>3.17>2.70>2.63 -- continue NS at 50 mL/h x 1 more day -- Avoid nephrotoxins, renal dose all medications -- Repeat BMP in a.m.  Lower extremity edema Elevated D-dimer D-dimer elevated 0.95.  Likely in the setting of active infection from a viral pneumonia with inflammation.  Vascular duplex ultrasound bilateral lower extremities negative for DVT.  Nuclear medicine VQ scan with normal perfusion. -- Ddimer 0.95>0.77>0.69 -- Monitor on telemetry  Hypotension:  Patient was noted to be hypotensive on EMS arrival.  Received IV fluid hydration and now improved. -- Home metoprolol discontinued -- Started on midodrine 10 mg p.o. 3 times daily -- Continue monitor BP closely  Hyponatremia: resolved Sodium 133 on admission, likely hypovolemia, natremia in the setting of poor oral intake. -- Na [id number] -- continue IVF hydration -- BMP daily  Leukocytosis WBC count elevated 16.5 on admission, likely secondary to steroid effect.  Procalcitonin 0.9.  No focal consolidation noted on chest x-ray and CT chest.  No acute abnormalities CT abdomen/pelvis. -- WBC 16.5>19.6>>13.1 -- CBC daily  Ischemic cardiomyopathy History of combined systolic/diastolic congestive heart failure, compensated No signs of pulmonary vascular congestion noted on chest x-ray or CT chest.  TTE 05/02/2021 with LVEF 40-45%, LV with mildly decreased function, but overall suboptimal study.  Follows with cardiology outpatient, Dr. Keene Breath -- Discontinued metoprolol succinate due to borderline hypotension -- Continue aspirin, statin, Plavix -- Strict I's and O's Daily weights -- Monitor on telemetry, monitor BP closely  Hx sick sinus syndrome s/p PPM -- Continue monitor on telemetry -- Outpatient follow-up cardiology  CAD -- Aspirin 81 mg p.o. daily -- Plavix 75 mg p.o. daily -- Crestor 40 mg p.o. daily  Hypothyroidism TSH 2.469, within normal limits. --  Levothyroxine 25 mg p.o. daily  Type 2 diabetes mellitus, with hyperglycemia Hemoglobin A1c 9.1 on 03/05/2022, poorly controlled.  Now with elevated glucose likely secondary to steroid use in the setting of COVID viral pneumonia as above.  Home regimen includes NPH 70/30 55 units every morning, 18 units every afternoon.  Vascular dementia, mild -- Namenda 10 mg p.o. twice daily  Constipation -- MiraLAX daily -- Closely monitor bowel movements  Weakness/deconditioning/gait disturbance: -- PT/OT evaluation today   DVT prophylaxis: heparin injection 5,000 Units Start: 05/27/22 2200    Code Status: Full Code Family Communication: Updated multiple family members present at bedside yet her afternoon, no family present at bedside this morning.  Disposition Plan:  Level of care: Progressive Status is: Inpatient Remains inpatient appropriate because: IV steroids, repeat blood cultures.  Anticipate possible discharge home in 2-3 days    Consultants:  None  Procedures:  BiPAP Vascular duplex ultrasound bilateral lower extremities Nuclear medicine VQ scan: Pending  Antimicrobials:  None   Subjective: Patient seen examined bedside, resting comfortably in bed.  Sitting at edge of bed eating breakfast.  No family present this morning.  Requesting some more water to drink this morning.  Also accidentally spilled his eggs on the floor requesting new breakfast tray.  Discussed with RN this morning.  No other questions or concerns at this time.  Denies headache, no dizziness, no chest pain, no palpitations, no abdominal pain, no focal weakness, no fatigue, no cough/congestion, no fever/chills/night sweats.  No acute events overnight per nursing staff.  Spouse updated by telephone this morning.  Objective: Vitals:  05/29/22 2052 05/29/22 2316 05/30/22 0751 05/30/22 1204  BP:  (!) 105/43 (!) 99/46 (!) 101/48  Pulse:  64    Resp:  17    Temp:  98.2 F (36.8 C) 98.1 F (36.7 C)    TempSrc:  Oral Oral   SpO2: 95% (!) 89%    Weight:      Height:        Intake/Output Summary (Last 24 hours) at 05/30/2022 1218 Last data filed at 05/30/2022 0630 Gross per 24 hour  Intake --  Output 1100 ml  Net -1100 ml   Filed Weights   05/27/22 1524  Weight: 97.8 kg    Examination:  Physical Exam: GEN: NAD, alert, chronically ill appearance, appears older than stated age HEENT: NCAT, PERRL, EOMI, sclera clear, MMM PULM: Breath sounds diminished bilateral bases, normal respiratory effort without accessory muscle use, no crackles, on 2 L nasal cannula with SpO2 91% at rest CV: RRR w/o M/G/R GI: abd soft, NTND, NABS, no R/G/M MSK: + 1 + bilateral peripheral edema, moves all extremity dependently NEURO: No focal deficits appreciated PSYCH: normal mood/affect Integumentary: Chronic venous changes bilateral lower extremities, hyperpigmentation anterior shins, no other concerning rashes/lesions/wounds noted on exposed skin surfaces    Data Reviewed: I have personally reviewed following labs and imaging studies  CBC: Recent Labs  Lab 05/27/22 1526 05/27/22 2230 05/28/22 0640 05/29/22 0348 05/30/22 0527  WBC 16.5* 19.6* 19.5* 13.5* 13.1*  NEUTROABS 14.3*  --  15.9* 12.4* 11.6*  HGB 13.6 12.9* 11.3* 10.5* 9.8*  HCT 40.5 37.1* 33.3* 32.5* 29.8*  MCV 90.2 90.0 89.8 92.1 92.5  PLT 202 197 213 180 416   Basic Metabolic Panel: Recent Labs  Lab 05/27/22 1526 05/27/22 2230 05/28/22 0640 05/29/22 0348 05/30/22 0527  NA 133*  --  135 135 139  K 4.8  --  4.1 4.6 4.6  CL 97*  --  101 107 107  CO2 22  --  20* 22 21*  GLUCOSE 295*  --  198* 182* 147*  BUN 128*  --  133* 111* 93*  CREATININE 3.25* 3.36* 3.17* 2.70* 2.63*  CALCIUM 8.7*  --  8.3* 7.9* 8.1*  MG 2.5*  --  2.6* 2.5* 2.6*  PHOS  --   --  4.5 5.0* 5.7*   GFR: Estimated Creatinine Clearance: 21.6 mL/min (A) (by C-G formula based on SCr of 2.63 mg/dL (H)). Liver Function Tests: Recent Labs  Lab  05/27/22 1526 05/28/22 0640 05/29/22 0348 05/30/22 0527  AST 22 24 36 37  ALT '23 21 28 27  '$ ALKPHOS 53 48 48 44  BILITOT 0.6 0.7 0.6 0.6  PROT 6.1* 5.4* 5.5* 5.4*  ALBUMIN 2.6* 2.4* 2.3* 2.4*   No results for input(s): "LIPASE", "AMYLASE" in the last 168 hours. No results for input(s): "AMMONIA" in the last 168 hours. Coagulation Profile: Recent Labs  Lab 05/27/22 1526  INR 1.3*   Cardiac Enzymes: No results for input(s): "CKTOTAL", "CKMB", "CKMBINDEX", "TROPONINI" in the last 168 hours. BNP (last 3 results) No results for input(s): "PROBNP" in the last 8760 hours. HbA1C: No results for input(s): "HGBA1C" in the last 72 hours. CBG: Recent Labs  Lab 05/29/22 0924 05/29/22 1209 05/29/22 1809 05/30/22 0755 05/30/22 1205  GLUCAP 221* 191* 92 159* 224*   Lipid Profile: No results for input(s): "CHOL", "HDL", "LDLCALC", "TRIG", "CHOLHDL", "LDLDIRECT" in the last 72 hours. Thyroid Function Tests: No results for input(s): "TSH", "T4TOTAL", "FREET4", "T3FREE", "THYROIDAB" in the last 72 hours. Anemia Panel:  Recent Labs    05/27/22 2230 05/28/22 0640  FERRITIN 392* 400*   Sepsis Labs: Recent Labs  Lab 05/27/22 1526 05/28/22 0200  PROCALCITON  --  0.96  LATICACIDVEN 1.9  --     Recent Results (from the past 240 hour(s))  Resp panel by RT-PCR (RSV, Flu A&B, Covid) Anterior Nasal Swab     Status: Abnormal   Collection Time: 05/21/22  1:59 PM   Specimen: Anterior Nasal Swab  Result Value Ref Range Status   SARS Coronavirus 2 by RT PCR POSITIVE (A) NEGATIVE Final    Comment: (NOTE) SARS-CoV-2 target nucleic acids are DETECTED.  The SARS-CoV-2 RNA is generally detectable in upper respiratory specimens during the acute phase of infection. Positive results are indicative of the presence of the identified virus, but do not rule out bacterial infection or co-infection with other pathogens not detected by the test. Clinical correlation with patient history and other  diagnostic information is necessary to determine patient infection status. The expected result is Negative.  Fact Sheet for Patients: EntrepreneurPulse.com.au  Fact Sheet for Healthcare Providers: IncredibleEmployment.be  This test is not yet approved or cleared by the [place] FDA and  has been authorized for detection and/or diagnosis of SARS-CoV-2 by FDA under an Emergency Use Authorization (EUA).  This EUA will remain in effect (meaning this test can be used) for the duration of  the COVID-19 declaration under Section 564(b)(1) of the A ct, 21 U.S.C. section 360bbb-3(b)(1), unless the authorization is terminated or revoked sooner.     Influenza A by PCR NEGATIVE NEGATIVE Final   Influenza B by PCR NEGATIVE NEGATIVE Final    Comment: (NOTE) The Xpert Xpress SARS-CoV-2/FLU/RSV plus assay is intended as an aid in the diagnosis of influenza from Nasopharyngeal swab specimens and should not be used as a sole basis for treatment. Nasal washings and aspirates are unacceptable for Xpert Xpress SARS-CoV-2/FLU/RSV testing.  Fact Sheet for Patients: EntrepreneurPulse.com.au  Fact Sheet for Healthcare Providers: IncredibleEmployment.be  This test is not yet approved or cleared by the [place] FDA and has been authorized for detection and/or diagnosis of SARS-CoV-2 by FDA under an Emergency Use Authorization (EUA). This EUA will remain in effect (meaning this test can be used) for the duration of the COVID-19 declaration under Section 564(b)(1) of the Act, 21 U.S.C. section 360bbb-3(b)(1), unless the authorization is terminated or revoked.     Resp Syncytial Virus by PCR NEGATIVE NEGATIVE Final    Comment: (NOTE) Fact Sheet for Patients: EntrepreneurPulse.com.au  Fact Sheet for Healthcare Providers: IncredibleEmployment.be  This test is not yet approved or  cleared by the [place] FDA and has been authorized for detection and/or diagnosis of SARS-CoV-2 by FDA under an Emergency Use Authorization (EUA). This EUA will remain in effect (meaning this test can be used) for the duration of the COVID-19 declaration under Section 564(b)(1) of the Act, 21 U.S.C. section 360bbb-3(b)(1), unless the authorization is terminated or revoked.  Performed at [facility], [place] [address], [place], [place] [address]   Culture, blood (Routine x 2)     Status: None (Preliminary result)   Collection Time: 05/27/22  3:26 PM   Specimen: BLOOD RIGHT ARM  Result Value Ref Range Status   Specimen Description BLOOD RIGHT ARM  Final   Special Requests   Final    BOTTLES DRAWN AEROBIC AND ANAEROBIC Blood Culture results may not be optimal due to an inadequate volume of blood received in culture bottles  Culture   Final    NO GROWTH 3 DAYS Performed at [facility], [place] [address] [place], [place] [address]    Report Status PENDING  Incomplete  Culture, blood (Routine x 2)     Status: None (Preliminary result)   Collection Time: 05/27/22  3:31 PM   Specimen: BLOOD LEFT ARM  Result Value Ref Range Status   Specimen Description BLOOD LEFT ARM  Final   Special Requests   Final    BOTTLES DRAWN AEROBIC AND ANAEROBIC Blood Culture adequate volume   Culture   Final    NO GROWTH 3 DAYS Performed at [facility], [place] [address], [place], [place] [address]    Report Status PENDING  Incomplete  Respiratory (~20 pathogens) panel by PCR     Status: None   Collection Time: 05/27/22  9:21 PM   Specimen: Nasopharyngeal Swab; Respiratory  Result Value Ref Range Status   Adenovirus NOT DETECTED NOT DETECTED Final   Coronavirus 229E NOT DETECTED NOT DETECTED Final    Comment: (NOTE) The Coronavirus on the Respiratory Panel, DOES NOT test for the novel  Coronavirus (2019 nCoV)    Coronavirus HKU1 NOT DETECTED NOT DETECTED Final    Coronavirus NL63 NOT DETECTED NOT DETECTED Final   Coronavirus OC43 NOT DETECTED NOT DETECTED Final   Metapneumovirus NOT DETECTED NOT DETECTED Final   Rhinovirus / Enterovirus NOT DETECTED NOT DETECTED Final   Influenza A NOT DETECTED NOT DETECTED Final   Influenza B NOT DETECTED NOT DETECTED Final   Parainfluenza Virus 1 NOT DETECTED NOT DETECTED Final   Parainfluenza Virus 2 NOT DETECTED NOT DETECTED Final   Parainfluenza Virus 3 NOT DETECTED NOT DETECTED Final   Parainfluenza Virus 4 NOT DETECTED NOT DETECTED Final   Respiratory Syncytial Virus NOT DETECTED NOT DETECTED Final   Bordetella pertussis NOT DETECTED NOT DETECTED Final   Bordetella Parapertussis NOT DETECTED NOT DETECTED Final   Chlamydophila pneumoniae NOT DETECTED NOT DETECTED Final   Mycoplasma pneumoniae NOT DETECTED NOT DETECTED Final    Comment: Performed at [facility], [place]. [address] [place], [place] [address]  Culture, blood (Routine X 2) w Reflex to ID Panel     Status: Abnormal (Preliminary result)   Collection Time: 05/27/22 10:30 PM   Specimen: BLOOD  Result Value Ref Range Status   Specimen Description BLOOD RIGHT ANTECUBITAL  Final   Special Requests   Final    BOTTLES DRAWN AEROBIC AND ANAEROBIC Blood Culture adequate volume   Culture  Setup Time   Final    AEROBIC BOTTLE ONLY GRAM POSITIVE COCCI IN CLUSTERS CRITICAL VALUE NOTED.  VALUE IS CONSISTENT WITH PREVIOUSLY REPORTED AND CALLED VALUE.    Culture (A)  Final    STAPHYLOCOCCUS EPIDERMIDIS STAPHYLOCOCCUS HOMINIS SUSCEPTIBILITIES TO FOLLOW Performed at [facility], [place] [address] [place], [place] [address]    Report Status PENDING  Incomplete  Culture, blood (Routine X 2) w Reflex to ID Panel     Status: Abnormal (Preliminary result)   Collection Time: 05/27/22 10:30 PM   Specimen: BLOOD LEFT HAND  Result Value Ref Range Status   Specimen Description BLOOD LEFT HAND  Final   Special Requests   Final    BOTTLES DRAWN AEROBIC  AND ANAEROBIC Blood Culture adequate volume   Culture  Setup Time   Final    AEROBIC BOTTLE ONLY GRAM POSITIVE COCCI IN CLUSTERS CRITICAL RESULT CALLED TO, READ BACK BY AND VERIFIED WITH:  C/ [place] 05/28/22 1639 A. LAFRANCE    Culture (A)  Final    STAPHYLOCOCCUS HOMINIS SUSCEPTIBILITIES TO FOLLOW CULTURE REINCUBATED FOR BETTER GROWTH Performed at [facility], [place] [address] [place], [place] [address]    Report Status PENDING  Incomplete  Blood Culture ID Panel (Reflexed)     Status: Abnormal   Collection Time: 05/27/22 10:30 PM  Result Value Ref Range Status   Enterococcus faecalis NOT DETECTED NOT DETECTED Final   Enterococcus Faecium NOT DETECTED NOT DETECTED Final   Listeria monocytogenes NOT DETECTED NOT DETECTED Final   Staphylococcus species DETECTED (A) NOT DETECTED Final    Comment: CRITICAL RESULT CALLED TO, READ BACK BY AND VERIFIED WITH:  C/ PHARMD L. CURRAN 05/28/22 1639 A. LAFRANCE    Staphylococcus aureus (BCID) NOT DETECTED NOT DETECTED Final   Staphylococcus epidermidis DETECTED (A) NOT DETECTED Final    Comment: Methicillin (oxacillin) resistant coagulase negative staphylococcus. Possible blood culture contaminant (unless isolated from more than one blood culture draw or clinical case suggests pathogenicity). No antibiotic treatment is indicated for blood  culture contaminants. CRITICAL RESULT CALLED TO, READ BACK BY AND VERIFIED WITH:  C/ PHARMD L. CURRAN 05/28/22 1639 A. LAFRANCE    Staphylococcus lugdunensis NOT DETECTED NOT DETECTED Final   Streptococcus species NOT DETECTED NOT DETECTED Final   Streptococcus agalactiae NOT DETECTED NOT DETECTED Final   Streptococcus pneumoniae NOT DETECTED NOT DETECTED Final   Streptococcus pyogenes NOT DETECTED NOT DETECTED Final   A.calcoaceticus-baumannii NOT DETECTED NOT DETECTED Final   Bacteroides fragilis NOT DETECTED NOT DETECTED Final   Enterobacterales NOT DETECTED NOT DETECTED Final    Enterobacter cloacae complex NOT DETECTED NOT DETECTED Final   Escherichia coli NOT DETECTED NOT DETECTED Final   Klebsiella aerogenes NOT DETECTED NOT DETECTED Final   Klebsiella oxytoca NOT DETECTED NOT DETECTED Final   Klebsiella pneumoniae NOT DETECTED NOT DETECTED Final   Proteus species NOT DETECTED NOT DETECTED Final   Salmonella species NOT DETECTED NOT DETECTED Final   Serratia marcescens NOT DETECTED NOT DETECTED Final   Haemophilus influenzae NOT DETECTED NOT DETECTED Final   Neisseria meningitidis NOT DETECTED NOT DETECTED Final   Pseudomonas aeruginosa NOT DETECTED NOT DETECTED Final   Stenotrophomonas maltophilia NOT DETECTED NOT DETECTED Final   Candida albicans NOT DETECTED NOT DETECTED Final   Candida auris NOT DETECTED NOT DETECTED Final   Candida glabrata NOT DETECTED NOT DETECTED Final   Candida krusei NOT DETECTED NOT DETECTED Final   Candida parapsilosis NOT DETECTED NOT DETECTED Final   Candida tropicalis NOT DETECTED NOT DETECTED Final   Cryptococcus neoformans/gattii NOT DETECTED NOT DETECTED Final   Methicillin resistance mecA/C DETECTED (A) NOT DETECTED Final    Comment: CRITICAL RESULT CALLED TO, READ BACK BY AND VERIFIED WITH:  C/ PHARMD L. CURRAN 05/28/22 1639 A. LAFRANCE Performed at [facility], [place] [address] [place], [place] [address]   Culture, blood (Routine X 2) w Reflex to ID Panel     Status: None (Preliminary result)   Collection Time: 05/29/22  3:19 PM   Specimen: BLOOD  Result Value Ref Range Status   Specimen Description BLOOD SITE NOT SPECIFIED  Final   Special Requests   Final    BOTTLES DRAWN AEROBIC AND ANAEROBIC Blood Culture adequate volume   Culture   Final    NO GROWTH < 24 HOURS Performed at [facility], [place] [address] [place], [place] [address]    Report Status PENDING  Incomplete  Culture, blood (Routine X 2) w Reflex to ID Panel     Status: None (Preliminary result)   Collection Time: 05/29/22  3:19 PM    Specimen: BLOOD RIGHT HAND  Result Value Ref Range Status   Specimen Description BLOOD RIGHT HAND  Final   Special Requests   Final    IN PEDIATRIC BOTTLE Blood Culture results may not be optimal due to an inadequate volume of blood received in culture bottles   Culture   Final    NO GROWTH < 24 HOURS Performed at [facility], [place] [address] [place], [place] [address]    Report Status PENDING  Incomplete         Radiology Studies: NM Pulmonary Perf and Vent  Result Date: 05/29/2022 CLINICAL DATA:  High clinical suspicion for pulmonary embolism, back pain, shortness of breath EXAM: NUCLEAR MEDICINE PERFUSION LUNG SCAN TECHNIQUE: Perfusion images were obtained in multiple projections after intravenous injection of radiopharmaceutical. Ventilation scans intentionally deferred if perfusion scan and chest x-ray adequate for interpretation during COVID 19 epidemic. RADIOPHARMACEUTICALS:  4 mCi Tc-13mMAA IV COMPARISON:  Chest radiograph done earlier today FINDINGS: There are no segmental or subsegmental wedge-shaped perfusion defects. IMPRESSION: Normal perfusion lung scan. Electronically Signed   By: PElmer PickerM.D.   On: 05/29/2022 16:29   VAS [place]OWER EXTREMITY VENOUS (DVT) (7a-7p)  Result Date: 05/29/2022  Lower Venous DVT Study Patient Name:  MMONG NEAL Date of Exam:   05/29/2022 Medical Rec #: [id number]      Accession #:    [id number] of Birth: [birth year]      Patient Gender: M Patient Age:   89years [facility]      VAS [place]OWER EXTREMITY VENOUS (DVT) Referring Phys: RHerbie BaltimoreLOCKWOOD --------------------------------------------------------------------------------  Indications: Swelling, and Edema.  Comparison Study: no prior Performing Technologist: MArchie PattenRVS  Examination Guidelines: A complete evaluation includes B-mode imaging, spectral Doppler, color Doppler, and power Doppler as needed of all accessible portions of each  vessel. Bilateral testing is considered an integral part of a complete examination. Limited examinations for reoccurring indications may be performed as noted. The reflux portion of the exam is performed with the patient in reverse Trendelenburg.  +---------+---------------+---------+-----------+----------+--------------+ RIGHT    CompressibilityPhasicitySpontaneityPropertiesThrombus Aging +---------+---------------+---------+-----------+----------+--------------+ CFV      Full           Yes      Yes                                 +---------+---------------+---------+-----------+----------+--------------+ SFJ      Full                                                        +---------+---------------+---------+-----------+----------+--------------+ FV Prox  Full                                                        +---------+---------------+---------+-----------+----------+--------------+ FV Mid   Full                                                        +---------+---------------+---------+-----------+----------+--------------+  FV DistalFull                                                        +---------+---------------+---------+-----------+----------+--------------+ PFV      Full                                                        +---------+---------------+---------+-----------+----------+--------------+ POP      Full           Yes      Yes                                 +---------+---------------+---------+-----------+----------+--------------+ PTV      Full                                                        +---------+---------------+---------+-----------+----------+--------------+ PERO     Full           Yes      Yes                                 +---------+---------------+---------+-----------+----------+--------------+   +---------+---------------+---------+-----------+----------+--------------+ LEFT      CompressibilityPhasicitySpontaneityPropertiesThrombus Aging +---------+---------------+---------+-----------+----------+--------------+ CFV      Full           Yes      Yes                                 +---------+---------------+---------+-----------+----------+--------------+ SFJ      Full                                                        +---------+---------------+---------+-----------+----------+--------------+ FV Prox  Full                                                        +---------+---------------+---------+-----------+----------+--------------+ FV Mid   Full                                                        +---------+---------------+---------+-----------+----------+--------------+ FV DistalFull                                                        +---------+---------------+---------+-----------+----------+--------------+  PFV      Full                                                        +---------+---------------+---------+-----------+----------+--------------+ POP      Full           Yes      Yes                                 +---------+---------------+---------+-----------+----------+--------------+ PTV      Full                                                        +---------+---------------+---------+-----------+----------+--------------+ PERO     Full                                                        +---------+---------------+---------+-----------+----------+--------------+     Summary: BILATERAL: - No evidence of deep vein thrombosis seen in the lower extremities, bilaterally. -No evidence of popliteal cyst, bilaterally.   *See table(s) above for measurements and observations. Electronically signed by Monica Martinez MD on 05/29/2022 at 2:57:05 PM.    Final    DG CHEST PORT 1 VIEW  Result Date: 05/29/2022 CLINICAL DATA:  Respiratory failure EXAM: PORTABLE CHEST 1 VIEW COMPARISON:  05/27/2022 FINDINGS:  Right-sided implanted cardiac device. Stable heart size. Aortic atherosclerosis. Aeration of the lung fields appears improved compared to the previous CT. No lobar consolidation is evident radiographically. No pleural effusion or pneumothorax. IMPRESSION: Improved aeration of the lung fields compared to the previous CT. Electronically Signed   By: Davina Poke D.O.   On: 05/29/2022 10:18        Scheduled Meds:  acetaminophen  500 mg Oral Daily   ascorbic acid  500 mg Oral Daily   aspirin EC  81 mg Oral QPM   budesonide (PULMICORT) nebulizer solution  0.5 mg Nebulization BID   clopidogrel  75 mg Oral Daily   docusate sodium  100 mg Oral Daily   heparin  5,000 Units Subcutaneous Q8H   insulin aspart  0-9 Units Subcutaneous TID WC   insulin aspart protamine- aspart  18 Units Subcutaneous QPM   insulin aspart protamine- aspart  55 Units Subcutaneous q morning   levothyroxine  25 mcg Oral Q0600   memantine  10 mg Oral BID   methylPREDNISolone (SOLU-MEDROL) injection  0.5 mg/kg Intravenous Q12H   Followed by   predniSONE  50 mg Oral Daily   midodrine  10 mg Oral TID WC   pantoprazole  40 mg Oral Daily   polyethylene glycol  17 g Oral Daily   rosuvastatin  40 mg Oral Daily   zinc sulfate  220 mg Oral Daily   Continuous Infusions:     LOS: 3 days    Time spent: 52 minutes spent on chart review, discussion with nursing staff, consultants, updating family and interview/physical exam; more than 50% of that  time was spent in counseling and/or coordination of care.    Venissa Nappi J [place] DO [facility] Available via Epic secure chat 7am-7pm After these hours, please refer to coverage provider listed on [email or web address] 05/30/2022, 12:18 PM

## 2022-05-30 NOTE — Progress Notes (Signed)
OT Cancellation Note  Patient Details Name: [name] MRN: [id number] DOB: 1931-08-25   Cancelled Treatment:    Reason Eval/Treat Not Completed: Other (comment).  Working with PT, OT to continue efforts.    Emilio Baylock D Montrice Gracey 05/30/2022, 3:58 PM 05/30/2022  RP, OTR/L  Acute Rehabilitation Services  Office:  [phone]

## 2022-05-30 NOTE — Progress Notes (Signed)
Pt undecided about being in the bed or the recliner, currently in the chair, noted. Pt said he will like someone to stay in the room to talk to him because his family is not here, noted. Call bell in reach.

## 2022-05-30 NOTE — Progress Notes (Signed)
SATURATION QUALIFICATIONS: (This note is used to comply with regulatory documentation for home oxygen)  Patient Saturations on Room Air at Rest = 91%  Patient Saturations on Room Air while Ambulating = 83%  Patient Saturations on 2 Liters of oxygen while Ambulating = 89%  Please briefly explain why patient needs home oxygen: to maintain SPO2 >=88%  Stacie Glaze, PT DPT Acute Rehabilitation Services Pager [phone]  Office [phone]

## 2022-05-30 NOTE — Plan of Care (Signed)

## 2022-05-30 NOTE — Plan of Care (Signed)

## 2022-05-30 NOTE — Evaluation (Signed)
Occupational Therapy Evaluation Patient Details Name: [name] MRN: [id number] DOB: 1931-10-01 Today's Date: 05/30/2022   History of Present Illness 89 yo male presents to [facility] on 1/6 with diagnosis of covid with x1 week of progressive weakness, hypotension, syncope. CT chest shows bilateral multifocal patchy areas of peribronchovascular ground-glass attenuation identified within the upper and lower Lobes consistent with PNA. Recent admission 12/31-1/1 for covid PNA. PMH includes ARF, CA, DM with neuropathy, pacemaker, TIA, CAD, CKD IV, HTN, OSA, DJD, mixed alzheimers and vascular dementia.   Clinical Impression   Patient admitted for the diagnosis above.  PTA, pt lived with his wife and son's family in a multi-family dwelling. Per pt, he was mod I in ADL and functional mobility. Upon eval, pt functioning grossly at min guard level for standing ADL. Performing LB ADL with min guard A and UB ADL with set-up. Pt reports he sponge bathes most often due to fear of falling in the shower. The patient is needing up to [facility] for basic in room mobility at a RW level and supplemental O2.  OT will follow in the acute setting to address deficits listed below, and maximize his functional status for an eventual return home.  Patient was seeing a [place] OT and PT PTA, and HH OT is not anticipated, but can be considered if the patient is interested.       Recommendations for follow up therapy are one component of a multi-disciplinary discharge planning process, led by the attending physician.  Recommendations may be updated based on patient status, additional functional criteria and insurance authorization.   Follow Up Recommendations  No OT follow up     Assistance Recommended at Discharge Set up Supervision/Assistance  Patient can return home with the following Assist for transportation    Functional Status Assessment  Patient has had a recent decline in their functional status and demonstrates the  ability to make significant improvements in function in a reasonable and predictable amount of time.  Equipment Recommendations  None recommended by OT    Recommendations for Other Services       Precautions / Restrictions Precautions Precautions: Fall;ICD/Pacemaker Precaution Comments: watch O2 - on 2LO2 Restrictions Weight Bearing Restrictions: No      Mobility Bed Mobility               General bed mobility comments: sitting EOB Patient Response: Cooperative  Transfers Overall transfer level: Needs assistance Equipment used: Rolling walker (2 wheels) Transfers: Sit to/from Stand Sit to Stand: Min guard                  Balance Overall balance assessment: Needs assistance Sitting-balance support: No upper extremity supported, Feet supported Sitting balance-Leahy Scale: Good     Standing balance support: Reliant on assistive device for balance Standing balance-Leahy Scale: Poor                             ADL either performed or assessed with clinical judgement   ADL       Grooming: Wash/dry hands;Oral care;Min guard;Standing               Lower Body Dressing: Min guard;Sit to/from stand   Toilet Transfer: Min guard;Rolling walker (2 wheels);Ambulation                   Vision Patient Visual Report: No change from baseline       Perception  Praxis      Pertinent Vitals/Pain Pain Assessment Pain Assessment: No/denies pain     Hand Dominance Right   Extremity/Trunk Assessment Upper Extremity Assessment Upper Extremity Assessment: Overall WFL for tasks assessed   Lower Extremity Assessment Lower Extremity Assessment: Defer to PT evaluation RLE Sensation: history of peripheral neuropathy LLE Sensation: history of peripheral neuropathy   Cervical / Trunk Assessment Cervical / Trunk Assessment: Kyphotic   Communication Communication Communication: HOH   Cognition Arousal/Alertness: Awake/alert Behavior  During Therapy: WFL for tasks assessed/performed Overall Cognitive Status: History of cognitive impairments - at baseline                                       General Comments  2LO2 to maintain SpO2 >89% with activity.    Exercises     Shoulder Instructions      Home Living Family/patient expects to be discharged to:: Private residence Living Arrangements: Spouse/significant other;Children;Other relatives Available Help at Discharge: Family;Available 24 hours/day Type of Home: House Home Access: Level entry     Home Layout: Multi-level;Able to live on main level with bedroom/bathroom Alternate Level Stairs-Number of Steps: 16 Alternate Level Stairs-Rails: Can reach both Bathroom Shower/Tub: Occupational psychologist: Standard Bathroom Accessibility: Yes How Accessible: Accessible via walker Home Equipment: Rollator (4 wheels);Grab bars - tub/shower;Grab bars - toilet;Cane - single point          Prior Functioning/Environment Prior Level of Function : Independent/Modified Independent             Mobility Comments: pt ambulates with rollator, states he has been struggling to ambulate over the past 2 weeks ADLs Comments: indep        OT Problem List: Impaired balance (sitting and/or standing);Decreased activity tolerance      OT Treatment/Interventions: Self-care/ADL training;Therapeutic activities;Patient/family education;Balance training;DME and/or AE instruction    OT Goals(Current goals can be found in the care plan section) Acute Rehab OT Goals Patient Stated Goal: Return home OT Goal Formulation: With patient Time For Goal Achievement: 06/13/22 Potential to Achieve Goals: Good ADL Goals Pt Will Perform Grooming: with modified independence;standing Pt Will Perform Lower Body Dressing: with modified independence;sit to/from stand Pt Will Transfer to Toilet: with modified independence;ambulating;regular height toilet  OT Frequency:  Min 2X/week    Co-evaluation              AM-PAC OT "6 Clicks" Daily Activity     Outcome Measure Help from another person eating meals?: None Help from another person taking care of personal grooming?: A Little Help from another person toileting, which includes using toliet, bedpan, or urinal?: A Little Help from another person bathing (including washing, rinsing, drying)?: A Little Help from another person to put on and taking off regular upper body clothing?: None Help from another person to put on and taking off regular lower body clothing?: A Little 6 Click Score: 20   End of Session Equipment Utilized During Treatment: Rolling walker (2 wheels);Oxygen Nurse Communication: Mobility status  Activity Tolerance: Patient tolerated treatment well Patient left: in bed;with call bell/phone within reach  OT Visit Diagnosis: Unsteadiness on feet (R26.81)                Time: 3244-0102 OT Time Calculation (min): 21 min Charges:  OT General Charges $OT Visit: 1 Visit OT Evaluation $OT Eval Moderate Complexity: 1 Mod  05/30/2022  RP, OTR/L  Acute Rehabilitation Services  Office:  [phone]   Metta Clines 05/30/2022, 4:56 PM

## 2022-05-31 DIAGNOSIS — J9601 Acute respiratory failure with hypoxia: Secondary | ICD-10-CM | POA: Diagnosis not present

## 2022-05-31 DIAGNOSIS — U071 COVID-19: Secondary | ICD-10-CM | POA: Diagnosis not present

## 2022-05-31 DIAGNOSIS — N179 Acute kidney failure, unspecified: Secondary | ICD-10-CM

## 2022-05-31 LAB — CBC WITH DIFFERENTIAL/PLATELET
Abs Immature Granulocytes: 0.19 10*3/uL — ABNORMAL HIGH (ref 0.00–0.07)
Basophils Absolute: 0 10*3/uL (ref 0.0–0.1)
Basophils Relative: 0 %
Eosinophils Absolute: 0 10*3/uL (ref 0.0–0.5)
Eosinophils Relative: 0 %
HCT: 28.3 % — ABNORMAL LOW (ref 39.0–52.0)
Hemoglobin: 9.6 g/dL — ABNORMAL LOW (ref 13.0–17.0)
Immature Granulocytes: 1 %
Lymphocytes Relative: 6 %
Lymphs Abs: 1.1 10*3/uL (ref 0.7–4.0)
MCH: 31.1 pg (ref 26.0–34.0)
MCHC: 33.9 g/dL (ref 30.0–36.0)
MCV: 91.6 fL (ref 80.0–100.0)
Monocytes Absolute: 0.3 10*3/uL (ref 0.1–1.0)
Monocytes Relative: 2 %
Neutro Abs: 16.8 10*3/uL — ABNORMAL HIGH (ref 1.7–7.7)
Neutrophils Relative %: 91 %
Platelets: 196 10*3/uL (ref 150–400)
RBC: 3.09 MIL/uL — ABNORMAL LOW (ref 4.22–5.81)
RDW: 14.6 % (ref 11.5–15.5)
WBC: 18.4 10*3/uL — ABNORMAL HIGH (ref 4.0–10.5)
nRBC: 0 % (ref 0.0–0.2)

## 2022-05-31 LAB — COMPREHENSIVE METABOLIC PANEL
ALT: 28 U/L (ref 0–44)
AST: 35 U/L (ref 15–41)
Albumin: 2.4 g/dL — ABNORMAL LOW (ref 3.5–5.0)
Alkaline Phosphatase: 49 U/L (ref 38–126)
Anion gap: 7 (ref 5–15)
BUN: 92 mg/dL — ABNORMAL HIGH (ref 8–23)
CO2: 23 mmol/L (ref 22–32)
Calcium: 8.2 mg/dL — ABNORMAL LOW (ref 8.9–10.3)
Chloride: 107 mmol/L (ref 98–111)
Creatinine, Ser: 2.51 mg/dL — ABNORMAL HIGH (ref 0.61–1.24)
GFR, Estimated: 24 mL/min — ABNORMAL LOW (ref >60)
Glucose, Bld: 99 mg/dL (ref 70–99)
Potassium: 4.6 mmol/L (ref 3.5–5.1)
Sodium: 137 mmol/L (ref 135–145)
Total Bilirubin: 0.5 mg/dL (ref 0.3–1.2)
Total Protein: 5.4 g/dL — ABNORMAL LOW (ref 6.5–8.1)

## 2022-05-31 LAB — PHOSPHORUS: Phosphorus: 4.8 mg/dL — ABNORMAL HIGH (ref 2.5–4.6)

## 2022-05-31 LAB — GLUCOSE, CAPILLARY
Glucose-Capillary: 105 mg/dL — ABNORMAL HIGH (ref 70–99)
Glucose-Capillary: 133 mg/dL — ABNORMAL HIGH (ref 70–99)
Glucose-Capillary: 121 mg/dL — ABNORMAL HIGH (ref 70–99)
Glucose-Capillary: 84 mg/dL (ref 70–99)

## 2022-05-31 LAB — D-DIMER, QUANTITATIVE: D-Dimer, Quant: 0.73 ug/mL-FEU — ABNORMAL HIGH (ref 0.00–0.50)

## 2022-05-31 LAB — MAGNESIUM: Magnesium: 2.4 mg/dL (ref 1.7–2.4)

## 2022-05-31 LAB — C-REACTIVE PROTEIN: CRP: 3.3 mg/dL — ABNORMAL HIGH (ref <1.0)

## 2022-05-31 MED ORDER — MAGNESIUM CITRATE PO SOLN: 1.0000 | Freq: Once | ORAL | Status: DC

## 2022-05-31 MED ORDER — BISACODYL 5 MG PO TBEC
10.0000 mg | DELAYED_RELEASE_TABLET | Freq: Once | ORAL | Status: AC
  Administered 2022-05-31: 10 mg via ORAL
  Filled 2022-05-31: qty 2

## 2022-05-31 MED ORDER — MAGNESIUM CITRATE PO SOLN
0.5000 | Freq: Once | ORAL | Status: AC
  Administered 2022-05-31: 0.5 via ORAL
  Filled 2022-05-31 (×2): qty 296

## 2022-05-31 NOTE — Progress Notes (Signed)
Occupational Therapy Treatment Patient Details Name: [name] MRN: [id number] DOB: [birth year] Today's Date: 05/31/2022   History of present illness 89 yo male presents to [facility] on 1/6 with diagnosis of covid with x1 week of progressive weakness, hypotension, syncope. CT chest shows bilateral multifocal patchy areas of peribronchovascular ground-glass attenuation identified within the upper and lower Lobes consistent with PNA. Recent admission 12/31-1/1 for covid PNA. PMH includes ARF, CA, DM with neuropathy, pacemaker, TIA, CAD, CKD IV, HTN, OSA, DJD, mixed alzheimers and vascular dementia.   OT comments  Pt greeted in sidelying reporting being "cold and naked." pt agreeable to OT intervention. Session focus on BADL reeducation, functional mobility, dynamic standing balance and decreasing overall caregiver burden.  Pt completed functional mobility in room with Rw and min guard assist for household distances. Pt completed wash up at sink with overall MIN A. Pt declined washing LB. Pt presents with decreased activity tolerance reporting feeling SOB after wash up. Pt on 3L [place] during session with SPO2 WFL. Pt would continue to benefit from skilled occupational therapy while admitted and after d/c to address the below listed limitations in order to improve overall functional mobility and facilitate independence with BADL participation. DC plan remains appropriate, will follow acutely per POC.                     Recommendations for follow up therapy are one component of a multi-disciplinary discharge planning process, led by the attending physician.  Recommendations may be updated based on patient status, additional functional criteria and insurance authorization.    Follow Up Recommendations  No OT follow up     Assistance Recommended at Discharge Set up Supervision/Assistance  Patient can return home with the following  Assist for transportation   Equipment Recommendations        Recommendations for Other Services      Precautions / Restrictions Precautions Precautions: Fall;ICD/Pacemaker Precaution Comments: watch O2 - on 2LO2 Restrictions Weight Bearing Restrictions: No       Mobility Bed Mobility Overal bed mobility: Needs Assistance Bed Mobility: Supine to Sit, Sit to Supine     Supine to sit: Min guard Sit to supine: Min guard   General bed mobility comments: min guard mostly for line mgmt, MIN cues needed initial to sequence supine>sit as pt trying to transition into long sitting with + difficulty    Transfers Overall transfer level: Needs assistance Equipment used: Rolling walker (2 wheels) Transfers: Sit to/from Stand Sit to Stand: Min guard           General transfer comment: min guard to rise from EOB and chair with no arm rests     Balance Overall balance assessment: Needs assistance Sitting-balance support: No upper extremity supported, Feet supported Sitting balance-Leahy Scale: Good     Standing balance support: During functional activity, No upper extremity supported Standing balance-Leahy Scale: Fair Standing balance comment: able to stand at sink for bathing with no UE support or LOB                           ADL either performed or assessed with clinical judgement   ADL Overall ADL's : Needs assistance/impaired     Grooming: Wash/dry face;Sitting;Set up   Upper Body Bathing: Minimal assistance;Sitting       Upper Body Dressing : Minimal assistance;Sitting Upper Body Dressing Details (indicate cue type and reason): hospital gown     Toilet Transfer: Min  guard;Rolling walker (2 wheels);Ambulation Toilet Transfer Details (indicate cue type and reason): simulated via functional mobility         Functional mobility during ADLs: Min guard;Rolling walker (2 wheels) General ADL Comments: ADl participation impacted by decreased activity tolerance    Extremity/Trunk Assessment Upper Extremity  Assessment Upper Extremity Assessment: RUE deficits/detail RUE Deficits / Details: pt reports previous R shoulder injury impacting horizontal shoulder ADD RUE: Shoulder pain with ROM   Lower Extremity Assessment Lower Extremity Assessment: Defer to PT evaluation        Vision Patient Visual Report: No change from baseline     Perception Perception Perception: Within Functional Limits   Praxis Praxis Praxis: Intact    Cognition Arousal/Alertness: Awake/alert Behavior During Therapy: WFL for tasks assessed/performed Overall Cognitive Status: History of cognitive impairments - at baseline                                          Exercises      Shoulder Instructions       General Comments pt on 3L [place] during session with SpO2 >92% during session,assisted pt with full wash up    Pertinent Vitals/ Pain       Pain Assessment Pain Assessment: No/denies pain  Home Living                                          Prior Functioning/Environment              Frequency  Min 2X/week        Progress Toward Goals  OT Goals(current goals can now be found in the care plan section)  Progress towards OT goals: Progressing toward goals  Acute Rehab OT Goals Patient Stated Goal: to wash up OT Goal Formulation: With patient Time For Goal Achievement: 06/13/22 Potential to Achieve Goals: Good  Plan Discharge plan remains appropriate;Frequency remains appropriate    Co-evaluation                 AM-PAC OT "6 Clicks" Daily Activity     Outcome Measure   Help from another person eating meals?: None Help from another person taking care of personal grooming?: None Help from another person toileting, which includes using toliet, bedpan, or urinal?: A Little Help from another person bathing (including washing, rinsing, drying)?: A Little Help from another person to put on and taking off regular upper body clothing?: None Help from  another person to put on and taking off regular lower body clothing?: None 6 Click Score: 22    End of Session Equipment Utilized During Treatment: Rolling walker (2 wheels);Oxygen;Other (comment) (3L [place])  OT Visit Diagnosis: Unsteadiness on feet (R26.81)   Activity Tolerance Patient tolerated treatment well   Patient Left in bed;with call bell/phone within reach;with bed alarm set   Nurse Communication Mobility status;Other (comment) (via secure chat)        Time: 9629-5284 OT Time Calculation (min): 24 min  Charges: OT General Charges $OT Visit: 1 Visit OT Treatments $Self Care/Home Management : 23-37 mins  Harley Alto., COTA/L Acute Rehabilitation Services [phone]   Precious Haws 05/31/2022, 4:06 PM

## 2022-05-31 NOTE — Progress Notes (Signed)
Pt refuses CPAP at this time. Pt stable and tolerating 2L [place]. Will continue to monitor

## 2022-05-31 NOTE — TOC Progression Note (Addendum)
Transition of Care [facility]) - Progression Note    Patient Details  Name: DELANO FRATE MRN: [id number] Date of Birth: Apr 06, 1932  Transition of [facility]) CM/SW Contact  Loletha Grayer Beverely Pace, RN Phone Number: 05/31/2022, 1:08 PM  Clinical Narrative:    Patient is 89yrold gentleman admitted with acute respiratory failure secondary to residual effects of COVID-19 viral pneumonia and hypotension, syncopal event. .  Case manager spoke with patient concerning need for oxygen and Home Health when discharged. [facility] Case Manager [facility]. CM explained to patient that [employer] will deliver travel tank to room and cylinders to his home. Patient has wife and family support at discharge.    [place]   Expected Discharge Plan and Services   Discharge Planning Services: CM Consult Post Acute Care Choice: Durable Medical Equipment Living arrangements for the past 2 months: Single Family Home                 DME Arranged: Oxygen DME Agency: [employer] Date DME Agency Contacted: 05/31/22 Time DME Agency Contacted: 1250 Representative spoke with at DME Agency: [place], OT [place]: [place], [place] 05/31/22 Time [place] 1300 Representative spoke with at [place] Interventions SDOH Screenings   Depression (PHQ2-9): Low Risk  (05/26/2019)  Tobacco Use: Medium Risk (05/21/2022)    Readmission Risk Interventions     No data to display

## 2022-05-31 NOTE — Plan of Care (Signed)
Id stewardship note   Patient with 1/6 bcx 2 species of CoNS. Pacemaker present. Repeat bcx 1/8 ngtd. Has not had treatment for Staph epi  Suspect contaminant Discussed with primary team

## 2022-05-31 NOTE — Progress Notes (Signed)
Patient refused prednisone today.  He advised he family doctor has told him not to take it because it causes constipation and his blood sugars to go up.  I did explain that he needs this to open his lungs for his Covid and we can give him Miralax for constipation and control the blood sugars with insulin.  He agreed to the Miralax but still declined the prednisone.  Patient dropped rovustatin x2 pills and one midodrine pill on the floor.  I got more pills from the pyxis and proceeded to give to patient.  Patient only agreed to take one rovustatin because he said he might have taken one and agreed to take the midodrine.  I assured patient he did not take these pills prior when I gave him his meds that I did see them fall on the floor and I had picked them up and thrown them away.

## 2022-05-31 NOTE — Progress Notes (Signed)
Received call from [facility] in Telemetry and he advised patient's pacemaker is not sensing.  I sent message to Dr. Waldron Labs and also advised him that patient's heartrate is running 69-low 70's. Dr. Johnette Abraham advised he will follow up.

## 2022-05-31 NOTE — Progress Notes (Signed)
PROGRESS NOTE    ALASTAIR HENNES  MLY:[id number] DOB: [birth year] DOA: 05/27/2022 PCP: [name], MD    Brief Narrative:   [name] is a 89 y.o. male with past medical history significant for CAD, ischemic cardiomyopathy, combined systolic/diastolic congestive heart failure, CHB s/p PPM, left carotid artery stenosis, CKD stage IV, DM2, hypothyroidism, OSA not on CPAP, history of CVA, who presented to [facility] ED on 1/6 with generalized weakness and syncopal event.  Patient recently diagnosed with COVID-19 viral infection about a week ago, hospitalized at [facility] long and discharged after 2 days of inpatient treatment with steroid taper.  Apparently his blood sugars had been elevated and his primary physician directed patient to discontinue steroids at that time.  Following discharge, patient had been constipated and while attempting to stand up from the toilet felt weak and ended up passing out.  On EMS arrival, patient blood pressure was noted to be 60/40 with repeat 80 over palp.  Patient was noted to be dyspneic oxygenating 90% on room air.  Glucose was 290.  And patient was transported to the ED for further evaluation.  In the ED, temperature 98.2 F, HR 97, RR 17, BP 117/65, SpO2 85% on room air.  WBC 16.5, hemoglobin 13.6, platelets 202.  Sodium 133, potassium 4.8, chloride 97, CO2 22, glucose 295, BUN 128, creatinine 3.25.  AST 22, ALT 23, total bilirubin 0.6.  Lactic acid 1.9.  Procalcitonin 0.96.  Urinalysis unrevealing.  Respiratory viral panel negative.  Chest x-ray with no evidence of acute cardiopulmonary disease process.  Chronic bibasilar atelectasis and asymmetric elevation of the right hemidiaphragm.  CT chest/abdomen/pelvis with bilateral multifocal patchy areas of peribronchovascular groundglass attenuation within the upper and lower lobes, most severe within the posterior medial right lower lobe consistent with Covid-19 pneumonia, no acute findings within the  abdomen or pelvis.  TRH was consulted for admission for further evaluation and management of acute respiratory failure secondary to residual effects of COVID-19 viral pneumonia and hypotension, syncopal event.  Assessment & Plan:   Acute hypoxic respiratory failure, POA COVID-19 viral pneumonia Patient presenting to ED via EMS after syncopal events while using the bathroom with progressive shortness of breath.  Recently diagnosed with COVID-19 and discharged from [facility] on 05/22/2022.  Patient was discharged on steroids that were discontinued by his PCP due to elevated glucose.  Patient was noted to be hypoxic on ED arrival with SpO2 85% and CT chest with findings of groundglass attenuation within the upper and lower lobes consistent with a viral pneumonia; no focal consolidation noted. -Completed 3 days of IV remdesivir -Initially on IV Decadron, currently transition to prednisone. -CRP trending down which is reassuring  -Remains hypoxic, 83% with activity, will need oxygen on discharge  Positive blood culture 2 out of 6 blood cultures, aerobic only positive for Staphylococcus epidermidis per [facility] ID.  Given patient has a PPM in place, concern for active infection but less likely.  ID input greatly appreciated, so far repeat blood cultures 1/8 remain negative, as well to species growing of blood cultures, which is likely related to contamination, patient remained stable where he had not had any treatment for staph epi, select this is contaminant,  Acute renal failure on CKD stage IV Baseline creatinine 2.1-2.2.  Creatinine elevated on admission of 3.25.  CT abdomen/pelvis on admission with mild bilateral and symmetric renal cortical thinning and perinephric fat stranding with no mass, no nephrolithiasis no hydronephrosis. -- Cr 3.25>3.36>3.17>2.70>2.63 -- continue  NS at 50 mL/h x 1 more day -- Avoid nephrotoxins, renal dose all medications -- Repeat BMP in a.m.  Lower extremity  edema Elevated D-dimer D-dimer elevated 0.95.  Likely in the setting of active infection from a viral pneumonia with inflammation.  Vascular duplex ultrasound bilateral lower extremities negative for DVT.  Nuclear medicine VQ scan with normal perfusion. -- Ddimer 0.95>0.77>0.69 -- Monitor on telemetry  Hypotension:  Patient was noted to be hypotensive on EMS arrival.  Received IV fluid hydration and now improved. -- Home metoprolol discontinued -- Started on midodrine 10 mg p.o. 3 times daily -- Continue monitor BP closely  Hyponatremia: resolved Sodium 133 on admission, likely hypovolemia, natremia in the setting of poor oral intake. -- Na [id number] -- continue IVF hydration -- BMP daily  Leukocytosis WBC count elevated 16.5 on admission, likely secondary to steroid effect.  Procalcitonin 0.9.  No focal consolidation noted on chest x-ray and CT chest.  No acute abnormalities CT abdomen/pelvis. -- WBC 16.5>19.6>>13.1 -- CBC daily  Ischemic cardiomyopathy History of combined systolic/diastolic congestive heart failure, compensated No signs of pulmonary vascular congestion noted on chest x-ray or CT chest.  TTE 05/02/2021 with LVEF 40-45%, LV with mildly decreased function, but overall suboptimal study.  Follows with cardiology outpatient, Dr. Keene Breath -- Discontinued metoprolol succinate due to borderline hypotension -- Continue aspirin, statin, Plavix -- Strict I's and O's Daily weights -- Monitor on telemetry, monitor BP closely  Hx sick sinus syndrome s/p PPM -- Continue monitor on telemetry -- Outpatient follow-up cardiology  CAD -- Aspirin 81 mg p.o. daily -- Plavix 75 mg p.o. daily -- Crestor 40 mg p.o. daily  Hypothyroidism TSH 2.469, within normal limits. -- Levothyroxine 25 mg p.o. daily  Type 2 diabetes mellitus, with hyperglycemia Hemoglobin A1c 9.1 on 03/05/2022, poorly controlled.  Now with elevated glucose likely secondary to steroid use in the setting of  COVID viral pneumonia as above.  Home regimen includes NPH 70/30 55 units every morning, 18 units every afternoon.  Vascular dementia, mild -- Namenda 10 mg p.o. twice daily  Constipation -- MiraLAX daily -- Closely monitor bowel movements  Weakness/deconditioning/gait disturbance: -- PT/OT evaluation today   DVT prophylaxis: heparin injection 5,000 Units Start: 05/27/22 2200    Code Status: Full Code Family Communication: Discussed with daughter-in-law at bedside  Disposition Plan:  Level of care: Progressive Status is: Inpatient Remains inpatient appropriate because: IV steroids, repeat blood cultures.  Anticipate possible discharge home in 2-3 days    Consultants:  None  Procedures:  BiPAP Vascular duplex ultrasound bilateral lower extremities Nuclear medicine VQ scan: Pending  Antimicrobials:  None   Subjective: Patient seen examined bedside, resting comfortably in bed.  Sitting at edge of bed eating breakfast.  No family present this morning.  Requesting some more water to drink this morning.  Also accidentally spilled his eggs on the floor requesting new breakfast tray.  Discussed with RN this morning.  No other questions or concerns at this time.  Denies headache, no dizziness, no chest pain, no palpitations, no abdominal pain, no focal weakness, no fatigue, no cough/congestion, no fever/chills/night sweats.  No acute events overnight per nursing staff.  Spouse updated by telephone this morning.  Objective: Vitals:   05/31/22 0600 05/31/22 0700 05/31/22 0800 05/31/22 1200  BP:   108/80 (!) 125/59  Pulse: 73 70 69 64  Resp: 18 16 (!) 25 (!) 23  Temp:   97.8 F (36.6 C)   TempSrc:   Oral Oral  SpO2: 96% 96% 96% 95%  Weight:      Height:        Intake/Output Summary (Last 24 hours) at 05/31/2022 1241 Last data filed at 05/31/2022 0300 Gross per 24 hour  Intake --  Output 1000 ml  Net -1000 ml   Filed Weights   05/27/22 1524  Weight: 97.8 kg     Examination:  Physical Exam:  Integumentary: Chronic venous changes bilateral lower extremities, hyperpigmentation anterior shins, no other concerning rashes/lesions/wounds noted on exposed skin surfaces Awake Alert, Oriented X 3, No new F.N deficits, Normal affect Symmetrical Chest wall movement, clear to auscultation, diminished at the bases RRR,No Gallops,Rubs or new Murmurs, No Parasternal Heave +ve B.Sounds, Abd Soft, No tenderness, No rebound - guarding or rigidity. No Cyanosis, Clubbing, +1 edema, No new Rash or bruise      Data Reviewed: I have personally reviewed following labs and imaging studies  CBC: Recent Labs  Lab 05/27/22 1526 05/27/22 2230 05/28/22 0640 05/29/22 0348 05/30/22 0527 05/31/22 0430  WBC 16.5* 19.6* 19.5* 13.5* 13.1* 18.4*  NEUTROABS 14.3*  --  15.9* 12.4* 11.6* 16.8*  HGB 13.6 12.9* 11.3* 10.5* 9.8* 9.6*  HCT 40.5 37.1* 33.3* 32.5* 29.8* 28.3*  MCV 90.2 90.0 89.8 92.1 92.5 91.6  PLT 202 197 213 180 183 338   Basic Metabolic Panel: Recent Labs  Lab 05/27/22 1526 05/27/22 2230 05/28/22 0640 05/29/22 0348 05/30/22 0527 05/31/22 0430  NA 133*  --  135 135 139 137  K 4.8  --  4.1 4.6 4.6 4.6  CL 97*  --  101 107 107 107  CO2 22  --  20* 22 21* 23  GLUCOSE 295*  --  198* 182* 147* 99  BUN 128*  --  133* 111* 93* 92*  CREATININE 3.25* 3.36* 3.17* 2.70* 2.63* 2.51*  CALCIUM 8.7*  --  8.3* 7.9* 8.1* 8.2*  MG 2.5*  --  2.6* 2.5* 2.6* 2.4  PHOS  --   --  4.5 5.0* 5.7* 4.8*   GFR: Estimated Creatinine Clearance: 22.7 mL/min (A) (by C-G formula based on SCr of 2.51 mg/dL (H)). Liver Function Tests: Recent Labs  Lab 05/27/22 1526 05/28/22 0640 05/29/22 0348 05/30/22 0527 05/31/22 0430  AST 22 24 36 37 35  ALT '23 21 28 27 28  '$ ALKPHOS 53 48 48 44 49  BILITOT 0.6 0.7 0.6 0.6 0.5  PROT 6.1* 5.4* 5.5* 5.4* 5.4*  ALBUMIN 2.6* 2.4* 2.3* 2.4* 2.4*   No results for input(s): "LIPASE", "AMYLASE" in the last 168 hours. No results for  input(s): "AMMONIA" in the last 168 hours. Coagulation Profile: Recent Labs  Lab 05/27/22 1526  INR 1.3*   Cardiac Enzymes: No results for input(s): "CKTOTAL", "CKMB", "CKMBINDEX", "TROPONINI" in the last 168 hours. BNP (last 3 results) No results for input(s): "PROBNP" in the last 8760 hours. HbA1C: No results for input(s): "HGBA1C" in the last 72 hours. CBG: Recent Labs  Lab 05/30/22 1205 05/30/22 1555 05/30/22 2044 05/31/22 0824 05/31/22 1236  GLUCAP 224* 230* 180* 105* 133*   Lipid Profile: No results for input(s): "CHOL", "HDL", "LDLCALC", "TRIG", "CHOLHDL", "LDLDIRECT" in the last 72 hours. Thyroid Function Tests: No results for input(s): "TSH", "T4TOTAL", "FREET4", "T3FREE", "THYROIDAB" in the last 72 hours. Anemia Panel: No results for input(s): "VITAMINB12", "FOLATE", "FERRITIN", "TIBC", "IRON", "RETICCTPCT" in the last 72 hours.  Sepsis Labs: Recent Labs  Lab 05/27/22 1526 05/28/22 0200  PROCALCITON  --  0.96  LATICACIDVEN 1.9  --  Recent Results (from the past 240 hour(s))  Resp panel by RT-PCR (RSV, Flu A&B, Covid) Anterior Nasal Swab     Status: Abnormal   Collection Time: 05/21/22  1:59 PM   Specimen: Anterior Nasal Swab  Result Value Ref Range Status   SARS Coronavirus 2 by RT PCR POSITIVE (A) NEGATIVE Final    Comment: (NOTE) SARS-CoV-2 target nucleic acids are DETECTED.  The SARS-CoV-2 RNA is generally detectable in upper respiratory specimens during the acute phase of infection. Positive results are indicative of the presence of the identified virus, but do not rule out bacterial infection or co-infection with other pathogens not detected by the test. Clinical correlation with patient history and other diagnostic information is necessary to determine patient infection status. The expected result is Negative.  Fact Sheet for Patients: EntrepreneurPulse.com.au  Fact Sheet for Healthcare  Providers: IncredibleEmployment.be  This test is not yet approved or cleared by the [place] FDA and  has been authorized for detection and/or diagnosis of SARS-CoV-2 by FDA under an Emergency Use Authorization (EUA).  This EUA will remain in effect (meaning this test can be used) for the duration of  the COVID-19 declaration under Section 564(b)(1) of the A ct, 21 U.S.C. section 360bbb-3(b)(1), unless the authorization is terminated or revoked sooner.     Influenza A by PCR NEGATIVE NEGATIVE Final   Influenza B by PCR NEGATIVE NEGATIVE Final    Comment: (NOTE) The Xpert Xpress SARS-CoV-2/FLU/RSV plus assay is intended as an aid in the diagnosis of influenza from Nasopharyngeal swab specimens and should not be used as a sole basis for treatment. Nasal washings and aspirates are unacceptable for Xpert Xpress SARS-CoV-2/FLU/RSV testing.  Fact Sheet for Patients: EntrepreneurPulse.com.au  Fact Sheet for Healthcare Providers: IncredibleEmployment.be  This test is not yet approved or cleared by the [place] FDA and has been authorized for detection and/or diagnosis of SARS-CoV-2 by FDA under an Emergency Use Authorization (EUA). This EUA will remain in effect (meaning this test can be used) for the duration of the COVID-19 declaration under Section 564(b)(1) of the Act, 21 U.S.C. section 360bbb-3(b)(1), unless the authorization is terminated or revoked.     Resp Syncytial Virus by PCR NEGATIVE NEGATIVE Final    Comment: (NOTE) Fact Sheet for Patients: EntrepreneurPulse.com.au  Fact Sheet for Healthcare Providers: IncredibleEmployment.be  This test is not yet approved or cleared by the [place] FDA and has been authorized for detection and/or diagnosis of SARS-CoV-2 by FDA under an Emergency Use Authorization (EUA). This EUA will remain in effect (meaning this test can be  used) for the duration of the COVID-19 declaration under Section 564(b)(1) of the Act, 21 U.S.C. section 360bbb-3(b)(1), unless the authorization is terminated or revoked.  Performed at [facility], [place] [address], [place], [place] [address]   Culture, blood (Routine x 2)     Status: None (Preliminary result)   Collection Time: 05/27/22  3:26 PM   Specimen: BLOOD RIGHT ARM  Result Value Ref Range Status   Specimen Description BLOOD RIGHT ARM  Final   Special Requests   Final    BOTTLES DRAWN AEROBIC AND ANAEROBIC Blood Culture results may not be optimal due to an inadequate volume of blood received in culture bottles   Culture   Final    NO GROWTH 4 DAYS Performed at [facility], [place] [address], [place], [place] [address]    Report Status PENDING  Incomplete  Culture, blood (Routine x 2)  Status: None (Preliminary result)   Collection Time: 05/27/22  3:31 PM   Specimen: BLOOD LEFT ARM  Result Value Ref Range Status   Specimen Description BLOOD LEFT ARM  Final   Special Requests   Final    BOTTLES DRAWN AEROBIC AND ANAEROBIC Blood Culture adequate volume   Culture   Final    NO GROWTH 4 DAYS Performed at [facility], [address] [place], [place] [address]    Report Status PENDING  Incomplete  Respiratory (~20 pathogens) panel by PCR     Status: None   Collection Time: 05/27/22  9:21 PM   Specimen: Nasopharyngeal Swab; Respiratory  Result Value Ref Range Status   Adenovirus NOT DETECTED NOT DETECTED Final   Coronavirus 229E NOT DETECTED NOT DETECTED Final    Comment: (NOTE) The Coronavirus on the Respiratory Panel, DOES NOT test for the novel  Coronavirus (2019 nCoV)    Coronavirus HKU1 NOT DETECTED NOT DETECTED Final   Coronavirus NL63 NOT DETECTED NOT DETECTED Final   Coronavirus OC43 NOT DETECTED NOT DETECTED Final   Metapneumovirus NOT DETECTED NOT DETECTED Final   Rhinovirus / Enterovirus NOT DETECTED NOT DETECTED Final    Influenza A NOT DETECTED NOT DETECTED Final   Influenza B NOT DETECTED NOT DETECTED Final   Parainfluenza Virus 1 NOT DETECTED NOT DETECTED Final   Parainfluenza Virus 2 NOT DETECTED NOT DETECTED Final   Parainfluenza Virus 3 NOT DETECTED NOT DETECTED Final   Parainfluenza Virus 4 NOT DETECTED NOT DETECTED Final   Respiratory Syncytial Virus NOT DETECTED NOT DETECTED Final   Bordetella pertussis NOT DETECTED NOT DETECTED Final   Bordetella Parapertussis NOT DETECTED NOT DETECTED Final   Chlamydophila pneumoniae NOT DETECTED NOT DETECTED Final   Mycoplasma pneumoniae NOT DETECTED NOT DETECTED Final    Comment: Performed at [facility], [place]. [address], [place], [place] [address]  Culture, blood (Routine X 2) w Reflex to ID Panel     Status: Abnormal (Preliminary result)   Collection Time: 05/27/22 10:30 PM   Specimen: BLOOD  Result Value Ref Range Status   Specimen Description BLOOD RIGHT ANTECUBITAL  Final   Special Requests   Final    BOTTLES DRAWN AEROBIC AND ANAEROBIC Blood Culture adequate volume   Culture  Setup Time   Final    AEROBIC BOTTLE ONLY GRAM POSITIVE COCCI IN CLUSTERS CRITICAL VALUE NOTED.  VALUE IS CONSISTENT WITH PREVIOUSLY REPORTED AND CALLED VALUE.    Culture (A)  Final    STAPHYLOCOCCUS EPIDERMIDIS STAPHYLOCOCCUS HOMINIS SUSCEPTIBILITIES TO FOLLOW Performed at [facility], [place] [address], [place], [place] [address]    Report Status PENDING  Incomplete  Culture, blood (Routine X 2) w Reflex to ID Panel     Status: Abnormal (Preliminary result)   Collection Time: 05/27/22 10:30 PM   Specimen: BLOOD LEFT HAND  Result Value Ref Range Status   Specimen Description BLOOD LEFT HAND  Final   Special Requests   Final    BOTTLES DRAWN AEROBIC AND ANAEROBIC Blood Culture adequate volume   Culture  Setup Time   Final    AEROBIC BOTTLE ONLY GRAM POSITIVE COCCI IN CLUSTERS CRITICAL RESULT CALLED TO, READ BACK BY AND VERIFIED WITH:  C/ PHARMD L. CURRAN  05/28/22 1639 A. LAFRANCE Performed at [facility], [place] [address] [place], [place] [address]    Culture STAPHYLOCOCCUS HOMINIS (A)  Final   Report Status PENDING  Incomplete   Organism ID, Bacteria  STAPHYLOCOCCUS HOMINIS  Final      Susceptibility   Staphylococcus hominis - MIC*    CIPROFLOXACIN <=0.5 SENSITIVE Sensitive     ERYTHROMYCIN >=8 RESISTANT Resistant     GENTAMICIN <=0.5 SENSITIVE Sensitive     OXACILLIN >=4 RESISTANT Resistant     TETRACYCLINE >=16 RESISTANT Resistant     VANCOMYCIN <=0.5 SENSITIVE Sensitive     TRIMETH/SULFA <=10 SENSITIVE Sensitive     CLINDAMYCIN <=0.25 SENSITIVE Sensitive     RIFAMPIN <=0.5 SENSITIVE Sensitive     Inducible Clindamycin NEGATIVE Sensitive     * STAPHYLOCOCCUS HOMINIS  Blood Culture ID Panel (Reflexed)     Status: Abnormal   Collection Time: 05/27/22 10:30 PM  Result Value Ref Range Status   Enterococcus faecalis NOT DETECTED NOT DETECTED Final   Enterococcus Faecium NOT DETECTED NOT DETECTED Final   Listeria monocytogenes NOT DETECTED NOT DETECTED Final   Staphylococcus species DETECTED (A) NOT DETECTED Final    Comment: CRITICAL RESULT CALLED TO, READ BACK BY AND VERIFIED WITH:  C/ PHARMD L. CURRAN 05/28/22 1639 A. LAFRANCE    Staphylococcus aureus (BCID) NOT DETECTED NOT DETECTED Final   Staphylococcus epidermidis DETECTED (A) NOT DETECTED Final    Comment: Methicillin (oxacillin) resistant coagulase negative staphylococcus. Possible blood culture contaminant (unless isolated from more than one blood culture draw or clinical case suggests pathogenicity). No antibiotic treatment is indicated for blood  culture contaminants. CRITICAL RESULT CALLED TO, READ BACK BY AND VERIFIED WITH:  C/ PHARMD L. CURRAN 05/28/22 1639 A. LAFRANCE    Staphylococcus lugdunensis NOT DETECTED NOT DETECTED Final   Streptococcus species NOT DETECTED NOT DETECTED Final   Streptococcus agalactiae NOT DETECTED NOT DETECTED Final   Streptococcus  pneumoniae NOT DETECTED NOT DETECTED Final   Streptococcus pyogenes NOT DETECTED NOT DETECTED Final   A.calcoaceticus-baumannii NOT DETECTED NOT DETECTED Final   Bacteroides fragilis NOT DETECTED NOT DETECTED Final   Enterobacterales NOT DETECTED NOT DETECTED Final   Enterobacter cloacae complex NOT DETECTED NOT DETECTED Final   Escherichia coli NOT DETECTED NOT DETECTED Final   Klebsiella aerogenes NOT DETECTED NOT DETECTED Final   Klebsiella oxytoca NOT DETECTED NOT DETECTED Final   Klebsiella pneumoniae NOT DETECTED NOT DETECTED Final   Proteus species NOT DETECTED NOT DETECTED Final   Salmonella species NOT DETECTED NOT DETECTED Final   Serratia marcescens NOT DETECTED NOT DETECTED Final   Haemophilus influenzae NOT DETECTED NOT DETECTED Final   Neisseria meningitidis NOT DETECTED NOT DETECTED Final   Pseudomonas aeruginosa NOT DETECTED NOT DETECTED Final   Stenotrophomonas maltophilia NOT DETECTED NOT DETECTED Final   Candida albicans NOT DETECTED NOT DETECTED Final   Candida auris NOT DETECTED NOT DETECTED Final   Candida glabrata NOT DETECTED NOT DETECTED Final   Candida krusei NOT DETECTED NOT DETECTED Final   Candida parapsilosis NOT DETECTED NOT DETECTED Final   Candida tropicalis NOT DETECTED NOT DETECTED Final   Cryptococcus neoformans/gattii NOT DETECTED NOT DETECTED Final   Methicillin resistance mecA/C DETECTED (A) NOT DETECTED Final    Comment: CRITICAL RESULT CALLED TO, READ BACK BY AND VERIFIED WITH:  C/ PHARMD L. CURRAN 05/28/22 1639 A. LAFRANCE Performed at [facility], [place] [address] [place], [place] [address]   Culture, blood (Routine X 2) w Reflex to ID Panel     Status: None (Preliminary result)   Collection Time: 05/29/22  3:19 PM   Specimen: BLOOD  Result Value Ref Range Status   Specimen Description BLOOD SITE NOT SPECIFIED  Final   Special Requests   Final    BOTTLES DRAWN AEROBIC AND ANAEROBIC Blood Culture adequate volume   Culture   Final     NO GROWTH 2 DAYS Performed at [facility], [address], [place], [place] [address]    Report Status PENDING  Incomplete  Culture, blood (Routine X 2) w Reflex to ID Panel     Status: None (Preliminary result)   Collection Time: 05/29/22  3:19 PM   Specimen: BLOOD RIGHT HAND  Result Value Ref Range Status   Specimen Description BLOOD RIGHT HAND  Final   Special Requests   Final    IN PEDIATRIC BOTTLE Blood Culture results may not be optimal due to an inadequate volume of blood received in culture bottles   Culture   Final    NO GROWTH 2 DAYS Performed at [facility], [place] [address] [place], [place] [address]    Report Status PENDING  Incomplete         Radiology Studies: NM Pulmonary Perf and Vent  Result Date: 05/29/2022 CLINICAL DATA:  High clinical suspicion for pulmonary embolism, back pain, shortness of breath EXAM: NUCLEAR MEDICINE PERFUSION LUNG SCAN TECHNIQUE: Perfusion images were obtained in multiple projections after intravenous injection of radiopharmaceutical. Ventilation scans intentionally deferred if perfusion scan and chest x-ray adequate for interpretation during COVID 19 epidemic. RADIOPHARMACEUTICALS:  4 mCi Tc-74mMAA IV COMPARISON:  Chest radiograph done earlier today FINDINGS: There are no segmental or subsegmental wedge-shaped perfusion defects. IMPRESSION: Normal perfusion lung scan. Electronically Signed   By: PElmer PickerM.D.   On: 05/29/2022 16:29        Scheduled Meds:  acetaminophen  500 mg Oral Daily   ascorbic acid  500 mg Oral Daily   aspirin EC  81 mg Oral QPM   bisacodyl  10 mg Oral Once   budesonide (PULMICORT) nebulizer solution  0.5 mg Nebulization BID   clopidogrel  75 mg Oral Daily   docusate sodium  100 mg Oral Daily   heparin  5,000 Units Subcutaneous Q8H   insulin aspart  0-9 Units Subcutaneous TID WC   insulin aspart protamine- aspart  18 Units Subcutaneous QPM   insulin aspart protamine- aspart  55 Units  Subcutaneous q morning   levothyroxine  25 mcg Oral Q0600   magnesium citrate  0.5 Bottle Oral Once   memantine  10 mg Oral BID   midodrine  10 mg Oral TID WC   pantoprazole  40 mg Oral Daily   polyethylene glycol  17 g Oral Daily   predniSONE  50 mg Oral Daily   rosuvastatin  40 mg Oral Daily   zinc sulfate  220 mg Oral Daily   Continuous Infusions:     LOS: 4 days      DPhillips Climes MD [facility] Available via Epic secure chat 7am-7pm After these hours, please refer to coverage provider listed on [email or web address] 05/31/2022, 12:41 PM

## 2022-06-01 ENCOUNTER — Other Ambulatory Visit (HOSPITAL_COMMUNITY): Payer: Self-pay

## 2022-06-01 DIAGNOSIS — I5042 Chronic combined systolic (congestive) and diastolic (congestive) heart failure: Secondary | ICD-10-CM | POA: Diagnosis not present

## 2022-06-01 DIAGNOSIS — J9601 Acute respiratory failure with hypoxia: Secondary | ICD-10-CM | POA: Diagnosis not present

## 2022-06-01 DIAGNOSIS — I255 Ischemic cardiomyopathy: Secondary | ICD-10-CM

## 2022-06-01 DIAGNOSIS — U071 COVID-19: Secondary | ICD-10-CM | POA: Diagnosis not present

## 2022-06-01 LAB — COMPREHENSIVE METABOLIC PANEL
ALT: 42 U/L (ref 0–44)
AST: 44 U/L — ABNORMAL HIGH (ref 15–41)
Albumin: 2.7 g/dL — ABNORMAL LOW (ref 3.5–5.0)
Alkaline Phosphatase: 52 U/L (ref 38–126)
Anion gap: 7 (ref 5–15)
BUN: 78 mg/dL — ABNORMAL HIGH (ref 8–23)
CO2: 29 mmol/L (ref 22–32)
Calcium: 8.5 mg/dL — ABNORMAL LOW (ref 8.9–10.3)
Chloride: 105 mmol/L (ref 98–111)
Creatinine, Ser: 2.34 mg/dL — ABNORMAL HIGH (ref 0.61–1.24)
GFR, Estimated: 26 mL/min — ABNORMAL LOW (ref >60)
Glucose, Bld: 100 mg/dL — ABNORMAL HIGH (ref 70–99)
Potassium: 4.4 mmol/L (ref 3.5–5.1)
Sodium: 141 mmol/L (ref 135–145)
Total Bilirubin: 0.6 mg/dL (ref 0.3–1.2)
Total Protein: 5.9 g/dL — ABNORMAL LOW (ref 6.5–8.1)

## 2022-06-01 LAB — CULTURE, BLOOD (ROUTINE X 2)
Culture: NO GROWTH
Culture: NO GROWTH
Special Requests: ADEQUATE
Special Requests: ADEQUATE
Special Requests: ADEQUATE

## 2022-06-01 LAB — D-DIMER, QUANTITATIVE: D-Dimer, Quant: 0.88 ug/mL-FEU — ABNORMAL HIGH (ref 0.00–0.50)

## 2022-06-01 LAB — GLUCOSE, CAPILLARY
Glucose-Capillary: 108 mg/dL — ABNORMAL HIGH (ref 70–99)
Glucose-Capillary: 118 mg/dL — ABNORMAL HIGH (ref 70–99)
Glucose-Capillary: 56 mg/dL — ABNORMAL LOW (ref 70–99)
Glucose-Capillary: 88 mg/dL (ref 70–99)

## 2022-06-01 LAB — CBC WITH DIFFERENTIAL/PLATELET
Abs Immature Granulocytes: 0.31 10*3/uL — ABNORMAL HIGH (ref 0.00–0.07)
Basophils Absolute: 0 10*3/uL (ref 0.0–0.1)
Basophils Relative: 0 %
Eosinophils Absolute: 0.1 10*3/uL (ref 0.0–0.5)
Eosinophils Relative: 1 %
HCT: 30.9 % — ABNORMAL LOW (ref 39.0–52.0)
Hemoglobin: 10.1 g/dL — ABNORMAL LOW (ref 13.0–17.0)
Immature Granulocytes: 2 %
Lymphocytes Relative: 12 %
Lymphs Abs: 2.2 10*3/uL (ref 0.7–4.0)
MCH: 30.1 pg (ref 26.0–34.0)
MCHC: 32.7 g/dL (ref 30.0–36.0)
MCV: 92.2 fL (ref 80.0–100.0)
Monocytes Absolute: 1.1 10*3/uL — ABNORMAL HIGH (ref 0.1–1.0)
Monocytes Relative: 6 %
Neutro Abs: 13.8 10*3/uL — ABNORMAL HIGH (ref 1.7–7.7)
Neutrophils Relative %: 79 %
Platelets: 199 10*3/uL (ref 150–400)
RBC: 3.35 MIL/uL — ABNORMAL LOW (ref 4.22–5.81)
RDW: 14.6 % (ref 11.5–15.5)
WBC: 17.5 10*3/uL — ABNORMAL HIGH (ref 4.0–10.5)
nRBC: 0 % (ref 0.0–0.2)

## 2022-06-01 LAB — MAGNESIUM: Magnesium: 2.8 mg/dL — ABNORMAL HIGH (ref 1.7–2.4)

## 2022-06-01 LAB — C-REACTIVE PROTEIN: CRP: 2.3 mg/dL — ABNORMAL HIGH (ref <1.0)

## 2022-06-01 LAB — PHOSPHORUS: Phosphorus: 4.2 mg/dL (ref 2.5–4.6)

## 2022-06-01 MED ORDER — DEXAMETHASONE 6 MG PO TABS
6.0000 mg | ORAL_TABLET | Freq: Every day | ORAL | 0 refills | Status: AC
  Filled 2022-06-01: qty 3, 3d supply, fill #0

## 2022-06-01 MED ORDER — PREDNISONE 5 MG PO TABS
50.0000 mg | ORAL_TABLET | Freq: Every day | ORAL | Status: DC
  Filled 2022-06-01: qty 2

## 2022-06-01 MED ORDER — MIDODRINE HCL 10 MG PO TABS
5.0000 mg | ORAL_TABLET | Freq: Three times a day (TID) | ORAL | 0 refills | Status: DC
  Filled 2022-06-01: qty 15, 10d supply, fill #0

## 2022-06-01 MED ORDER — GLUCOSE 4 G PO CHEW
1.0000 | CHEWABLE_TABLET | Freq: Once | ORAL | Status: AC
  Administered 2022-06-01: 4 g via ORAL
  Filled 2022-06-01: qty 1

## 2022-06-01 NOTE — Plan of Care (Signed)

## 2022-06-01 NOTE — Progress Notes (Signed)
Pt refused for tab. Prednisone telling he will get constipation with it.Education provided as needed.Patient is made aware that he is getting stool softeners.MD aware

## 2022-06-01 NOTE — Discharge Instructions (Signed)
Follow with Primary MD [name], MD in 7 days   Get CBC, CMP, 2 view Chest X ray checked  by Primary MD next visit.    Activity: As tolerated with Full fall precautions use walker/cane & assistance as needed   Disposition Home    Diet: Heart Healthy  .Marland Kitchen   On your next visit with your primary care physician please Get Medicines reviewed and adjusted.   Please request your Prim.MD to go over all Hospital Tests and Procedure/Radiological results at the follow up, please get all Hospital records sent to your Prim MD by signing hospital release before you go home.   If you experience worsening of your admission symptoms, develop shortness of breath, life threatening emergency, suicidal or homicidal thoughts you must seek medical attention immediately by calling 911 or calling your MD immediately  if symptoms less severe.  You Must read complete instructions/literature along with all the possible adverse reactions/side effects for all the Medicines you take and that have been prescribed to you. Take any new Medicines after you have completely understood and accpet all the possible adverse reactions/side effects.   Do not drive, operating heavy machinery, perform activities at heights, swimming or participation in water activities or provide baby sitting services if your were admitted for syncope or siezures until you have seen by Primary MD or a Neurologist and advised to do so again.  Do not drive when taking Pain medications.    Do not take more than prescribed Pain, Sleep and Anxiety Medications  Special Instructions: If you have smoked or chewed Tobacco  in the last 2 yrs please stop smoking, stop any regular Alcohol  and or any Recreational drug use.  Wear Seat belts while driving.   Please note  You were cared for by a hospitalist during your hospital stay. If you have any questions about your discharge medications or the care you received while you were in the hospital  after you are discharged, you can call the unit and asked to speak with the hospitalist on call if the hospitalist that took care of you is not available. Once you are discharged, your primary care physician will handle any further medical issues. Please note that NO REFILLS for any discharge medications will be authorized once you are discharged, as it is imperative that you return to your primary care physician (or establish a relationship with a primary care physician if you do not have one) for your aftercare needs so that they can reassess your need for medications and monitor your lab values.

## 2022-06-01 NOTE — Progress Notes (Signed)
CBG was 54 patient was dizzy.1 Cup orange juice,1 glucose tab and 1 ensure given

## 2022-06-01 NOTE — TOC Transition Note (Signed)
Transition of Care [facility]) - CM/SW Discharge Note   Patient Details  Name: [name] MRN: [id number] Date of Birth: March 13, 1932  [facility] CM/SW Contact:  Verdell Carmine, RN Phone Number: 06/01/2022, 12:27 PM   Clinical Narrative:     Patient is being DC home today. [employer] to bring up oxygen tank for DC. Cory from [place] made aware of DC. RN messaged regarding tank to get home.  Final next level of care: [place] Barriers to Discharge: No Barriers Identified   Patient Goals and CMS Choice   Choice offered to / list presented to : Patient  Discharge Placement               DC home with home health          Discharge Plan and Services Additional resources added to the After Visit Summary for     Discharge Planning Services: CM Consult Post Acute Care Choice: Durable Medical Equipment          DME Arranged: Oxygen DME Agency: [employer] Date DME Agency Contacted: 05/31/22 Time DME Agency Contacted: 1250 Representative spoke with at DME Agency: [place]: PT, OT [place] Agency: NA, [place] Date [place]: 05/31/22 Time HH Agency Contacted: 1300 Representative spoke with at [place]: [place] ([place]) Interventions SDOH Screenings   Depression (PHQ2-9): Low Risk  (05/26/2019)  Tobacco Use: Medium Risk (05/21/2022)     Readmission Risk Interventions     No data to display

## 2022-06-01 NOTE — Progress Notes (Signed)
Physical Therapy Treatment Patient Details Name: [name] MRN: [id number] DOB: 04/19/1932 Today's Date: 06/01/2022   History of Present Illness 89 yo male presents to [facility] on 1/6 with diagnosis of covid with x1 week of progressive weakness, hypotension, syncope. CT chest shows bilateral multifocal patchy areas of peribronchovascular ground-glass attenuation identified within the upper and lower [place]ob[place] consistent with PNA. Recent admission 12/31-1/1 for covid PNA. PMH includes ARF, CA, DM with neuropathy, pacemaker, TIA, CAD, CKD IV, HTN, OSA, DJD, mixed alzheimers and vascular dementia.    PT Comments    Pt participating well in today's session, mainly limited by fatigue and desaturation with OOB mobility. Pt reporting mild fatigue with mobility, however desatting to low 80s on 3L O2 [place], recovering quickly with seated rest break and cueing for deep breathing. Pt able to stand x5 trials with CGA and use of RW, pt performing marching in place, with x2 attempts to ambulate forward, however pt continuing to desat so returned to seated position. Pt cued for decreasing conversation with standing and for pursed lip and deep breathing, however pt unable to perform without desaturation. Pt educated on resting for recovery and verbalizing understanding. Pt will continue to benefit from skilled acute PT at this time to progress mobility and activity tolerance, discharge recommendations remain appropriate.     Recommendations for follow up therapy are one component of a multi-disciplinary discharge planning process, led by the attending physician.  Recommendations may be updated based on patient status, additional functional criteria and insurance authorization.  Follow Up Recommendations  Home health PT     Assistance Recommended at Discharge Intermittent Supervision/Assistance  Patient can return home with the following A little help with walking and/or transfers;A little help with  bathing/dressing/bathroom   Equipment Recommendations  None recommended by PT    Recommendations for Other Services       Precautions / Restrictions Precautions Precautions: Fall;ICD/Pacemaker Restrictions Weight Bearing Restrictions: No Other Position/Activity Restrictions: watch O2- on 3L     Mobility  Bed Mobility               General bed mobility comments: pt seated EOB upon arrival, left seated EOB so pt could finish breakfast Patient Response: Anxious  Transfers Overall transfer level: Needs assistance Equipment used: Rolling walker (2 wheels) Transfers: Sit to/from Stand Sit to Stand: Min guard           General transfer comment: min guard to rise from EOB and for safety with initial balance    Ambulation/Gait             Pre-gait activities: Marching in place in standing, pt desatting so unable to progress to ambulation. General Gait Details: pt desatting with marching in place, cueing for deep breathing but unable to ambulate due to fatigue today.   Stairs             Wheelchair Mobility    Modified Rankin (Stroke Patients Only)       Balance Overall balance assessment: Needs assistance Sitting-balance support: Feet supported, No upper extremity supported Sitting balance-Leahy Scale: Good     Standing balance support: During functional activity, Bilateral upper extremity supported Standing balance-Leahy Scale: Fair Standing balance comment: marching in place and static standing with BUE support                            Cognition Arousal/Alertness: Awake/alert Behavior During Therapy: WFL for tasks assessed/performed Overall Cognitive Status: Within  Functional Limits for tasks assessed                                          Exercises General Exercises - Lower Extremity Hip Flexion/Marching: AROM, Both, Seated, Standing, 10 reps, Other (comment) (1 set in sitting, x3 sets in standing)     General Comments        Pertinent Vitals/Pain Pain Assessment Pain Assessment: No/denies pain    Home Living                          Prior Function            PT Goals (current goals can now be found in the care plan section) Acute Rehab PT Goals Patient Stated Goal: walk more PT Goal Formulation: With patient Time For Goal Achievement: 06/13/22 Potential to Achieve Goals: Good Progress towards PT goals: Progressing toward goals    Frequency    Min 3X/week      PT Plan Current plan remains appropriate    Co-evaluation              AM-PAC PT "6 Clicks" Mobility   Outcome Measure  Help needed turning from your back to your side while in a flat bed without using bedrails?: None Help needed moving from lying on your back to sitting on the side of a flat bed without using bedrails?: A Little Help needed moving to and from a bed to a chair (including a wheelchair)?: A Little Help needed standing up from a chair using your arms (e.g., wheelchair or bedside chair)?: A Little Help needed to walk in hospital room?: A Little Help needed climbing 3-5 steps with a railing? : A Little 6 Click Score: 19    End of Session Equipment Utilized During Treatment: Oxygen Activity Tolerance: Patient limited by fatigue Patient left: in bed;with call bell/phone within reach;Other (comment) (seated EOB) Nurse Communication: Mobility status;Other (comment) (RN aware of O2 desat, intermittently present throughout session) PT Visit Diagnosis: Other abnormalities of gait and mobility (R26.89)     Time: 3235-5732 PT Time Calculation (min) (ACUTE ONLY): 26 min  Charges:  $Therapeutic Exercise: 8-22 mins $Therapeutic Activity: 8-22 mins                     Charlynne Cousins, PT DPT Acute Rehabilitation Services Office [phone]    Luvenia Heller 06/01/2022, 11:15 AM

## 2022-06-01 NOTE — Progress Notes (Signed)
Patient refused cpap, is on 2L satting 95%.

## 2022-06-01 NOTE — Discharge Summary (Signed)
Physician Discharge Summary  [name] CZY:[id number] DOB: 04/29/1932 DOA: 05/27/2022  PCP: William Jordan, MD  Admit date: 05/27/2022 Discharge date: 06/01/2022  Admitted From: (Home) Disposition:  (Home)  Recommendations for Outpatient Follow-up:  Follow up with PCP in 1-2 weeks Please obtain BMP/CBC in one week Send resuming back on metoprolol once off midodrine and blood pressure has stabilized  Home Health: (YES) Equipment/Devices: (oxygen 2 L)  Discharge Condition: (Stable) CODE STATUS: (FULL) Diet recommendation: Heart Healthy / Carb Modified   Brief/Interim Summary:  [name] is a 89 y.o. male with past medical history significant for CAD, ischemic cardiomyopathy, combined systolic/diastolic congestive heart failure, CHB s/p PPM, left carotid artery stenosis, CKD stage IV, DM2, hypothyroidism, OSA not on CPAP, history of CVA, who presented to [facility] ED on 1/6 with generalized weakness and syncopal event.  He was recently diagnosed with COVID, hospitalized at [facility], discharged after 2 days of inpatient treatment with steroid taper,CT chest/abdomen/pelvis with bilateral multifocal patchy areas of peribronchovascular groundglass attenuation within the upper and lower lobes, most severe within the posterior medial right lower lobe consistent with Covid-19 pneumonia, no acute findings within the abdomen or pelvis. TRH was consulted for admission for further evaluation and management of acute respiratory failure secondary to residual effects of COVID-19 viral pneumonia and hypotension, syncopal event.   Acute hypoxic respiratory failure, POA COVID-19 viral pneumonia Patient presenting to ED via EMS after syncopal events while using the bathroom with progressive shortness of breath.  Recently diagnosed with COVID-19 and discharged from [facility] on 05/22/2022.  Patient was discharged on steroids that were discontinued by his PCP due to elevated  glucose.  Patient was noted to be hypoxic on ED arrival with SpO2 85% and CT chest with findings of groundglass attenuation within the upper and lower lobes consistent with a viral pneumonia; no focal consolidation noted. -Completed 3 days of IV remdesivir -Initially on IV Decadron, sent to prednisone, to finish Decadron as an outpatient -CRP trending down which is reassuring  -Oxygen has been arranged at time of discharge   Positive blood culture/contamination 2 out of 6 blood cultures, aerobic only positive for Staphylococcus epidermidis and hominis, there was a concern for infection given patient has PCCM, but it does appear to be less likely, ID workup sided, other input greatly appreciated, repeat blood cultures were obtained 1/8, which remain negative x 3 days, findings felt due to be contaminant, no treatment is indicated.     Acute renal failure on CKD stage IV Baseline creatinine 2.1-2.2.  Creatinine elevated on admission of 3.25.  CT abdomen/pelvis on admission with mild bilateral and symmetric renal cortical thinning and perinephric fat stranding with no mass, no nephrolithiasis no hydronephrosis.    Lower extremity edema Elevated D-dimer D-dimer elevated 0.95.  Likely in the setting of active infection from a viral pneumonia with inflammation.  Vascular duplex ultrasound bilateral lower extremities negative for DVT.  Nuclear medicine VQ scan with normal perfusion. -- Ddimer 0.95>0.77>0.69  Hypotension:  Patient was noted to be hypotensive on EMS arrival.  This has improved with gentle hydration with IV fluid, as well has metoprolol has been held during hospital stay, and he required to starting midodrine, blood pressure currently acceptable on current measures, will have to hold metoprolol at time of discharge, and he will be discharged on low dose midodrine with plan to taper over the next 15 days -  Hyponatremia: resolved Sodium 133 on admission, my, resolved with IV  fluids    Leukocytosis WBC count elevated 16.5 on admission, likely secondary to steroid effect.  Procalcitonin 0.9.  No focal consolidation noted on chest x-ray and CT chest.  No acute abnormalities CT abdomen/pelvis.   Ischemic cardiomyopathy History of combined systolic/diastolic congestive heart failure, compensated No signs of pulmonary vascular congestion noted on chest x-ray or CT chest.  TTE 05/02/2021 with LVEF 40-45%, LV with mildly decreased function, but overall suboptimal study.  Follows with cardiology outpatient, Dr. Keene Breath -- Discontinued metoprolol succinate due to borderline hypotension -- Continue aspirin, statin, Plavix   Hx sick sinus syndrome s/p PPM -- Continue monitor on telemetry -- Outpatient follow-up cardiology   CAD -- Aspirin 81 mg p.o. daily -- Plavix 75 mg p.o. daily -- Crestor 40 mg p.o. daily   Hypothyroidism TSH 2.469, within normal limits. -- Levothyroxine 25 mg p.o. daily   Type 2 diabetes mellitus, with hyperglycemia Hemoglobin A1c 9.1 on 03/05/2022, poorly controlled Resume home regimen on discharge   Vascular dementia, mild -- Namenda 10 mg p.o. twice daily   Constipation -- MiraLAX daily -- Closely monitor bowel movements   Weakness/deconditioning/gait disturbance: -- PT/OT at home   Discharge Diagnoses:  Principal Problem:   Acute hypoxic respiratory failure ([place]) Active Problems:   Cardiomyopathy, ischemic   CAD (coronary artery disease)   Chronic combined systolic and diastolic heart failure   Hypothyroidism   COVID-19 virus infection    Discharge Instructions  Discharge Instructions     Diet - low sodium heart healthy   Complete by: As directed    Discharge instructions   Complete by: As directed    Follow with Primary MD William Jordan, MD in 7 days   Get CBC, CMP, 2 view Chest X ray checked  by Primary MD next visit.    Activity: As tolerated with Full fall precautions use walker/cane & assistance as  needed   Disposition Home    Diet: Heart Healthy  .Marland Kitchen   On your next visit with your primary care physician please Get Medicines reviewed and adjusted.   Please request your Prim.MD to go over all Hospital Tests and Procedure/Radiological results at the follow up, please get all Hospital records sent to your Prim MD by signing hospital release before you go home.   If you experience worsening of your admission symptoms, develop shortness of breath, life threatening emergency, suicidal or homicidal thoughts you must seek medical attention immediately by calling 911 or calling your MD immediately  if symptoms less severe.  You Must read complete instructions/literature along with all the possible adverse reactions/side effects for all the Medicines you take and that have been prescribed to you. Take any new Medicines after you have completely understood and accpet all the possible adverse reactions/side effects.   Do not drive, operating heavy machinery, perform activities at heights, swimming or participation in water activities or provide baby sitting services if your were admitted for syncope or siezures until you have seen by Primary MD or a Neurologist and advised to do so again.  Do not drive when taking Pain medications.    Do not take more than prescribed Pain, Sleep and Anxiety Medications  Special Instructions: If you have smoked or chewed Tobacco  in the last 2 yrs please stop smoking, stop any regular Alcohol  and or any Recreational drug use.  Wear Seat belts while driving.   Please note  You were cared for by a hospitalist during your hospital stay. If you have any questions  about your discharge medications or the care you received while you were in the hospital after you are discharged, you can call the unit and asked to speak with the hospitalist on call if the hospitalist that took care of you is not available. Once you are discharged, your primary care physician will  handle any further medical issues. Please note that NO REFILLS for any discharge medications will be authorized once you are discharged, as it is imperative that you return to your primary care physician (or establish a relationship with a primary care physician if you do not have one) for your aftercare needs so that they can reassess your need for medications and monitor your lab values.   Increase activity slowly   Complete by: As directed       Allergies as of 06/01/2022   No Known Allergies      Medication List     STOP taking these medications    metoprolol succinate 25 MG 24 hr tablet Commonly known as: TOPROL-XL       TAKE these medications    acetaminophen 500 MG tablet Commonly known as: TYLENOL Take 500 mg by mouth 2 (two) times daily as needed for moderate pain.   aspirin 81 MG tablet Take 81 mg by mouth every evening.   clopidogrel 75 MG tablet Commonly known as: PLAVIX Take 1 tablet by mouth once daily What changed: when to take this   Colace 100 MG capsule Generic drug: docusate sodium Take 100 mg by mouth daily.   COQ10 150 MG Caps Take 150 mg by mouth every evening.   dexamethasone 6 MG tablet Commonly known as: Decadron Take 1 tablet (6 mg total) by mouth daily for 3 days.   Dulcolax 5 MG EC tablet Generic drug: bisacodyl Take 5 mg by mouth daily as needed for severe constipation.   levothyroxine 25 MCG tablet Commonly known as: SYNTHROID Take 25 mcg by mouth daily.   memantine 10 MG tablet Commonly known as: NAMENDA Take 1 tablet (10 mg total) by mouth 2 (two) times daily.   midodrine 10 MG tablet Commonly known as: PROAMATINE Take 0.5 tablets (5 mg total) by mouth 3 (three) times daily with meals.   nitroGLYCERIN 0.4 MG SL tablet Commonly known as: NITROSTAT DISSOLVE ONE TABLET UNDER THE TONGUE EVERY 5 MINUTES AS NEEDED FOR CHEST PAIN. What changed: See the new instructions.   NovoLIN 70/30 Kwikpen (70-30) 100 UNIT/ML  KwikPen Generic drug: insulin isophane & regular human KwikPen Inject 18-55 Units into the skin 2 (two) times daily. Takes 55 units in the morning and 18 units at night.   OVER THE COUNTER MEDICATION Take 1 tablet by mouth in the morning and at bedtime. cocoavia   OVER THE COUNTER MEDICATION Take 1 tablet by mouth in the morning and at bedtime. Emma OTC medication   polyethylene glycol 17 g packet Commonly known as: MIRALAX / GLYCOLAX Take 17 g by mouth daily.   RENAL MULTIVITAMIN/ZINC PO Take 1 tablet by mouth every evening.   rosuvastatin 40 MG tablet Commonly known as: CRESTOR Take 1 tablet by mouth once daily What changed: when to take this               Durable Medical Equipment  (From admission, onward)           Start     Ordered   05/31/22 1243  For home use only DME oxygen  Once       Question Answer Comment  Length of Need 6 Months   Mode or (Route) Nasal cannula   Liters per Minute 2   Frequency Continuous (stationary and portable oxygen unit needed)   Oxygen conserving device Yes   Oxygen delivery system Gas      05/31/22 1243            No Known Allergies  Consultations: None   Procedures/Studies: NM Pulmonary Perf and Vent  Result Date: 05/29/2022 CLINICAL DATA:  High clinical suspicion for pulmonary embolism, back pain, shortness of breath EXAM: NUCLEAR MEDICINE PERFUSION LUNG SCAN TECHNIQUE: Perfusion images were obtained in multiple projections after intravenous injection of radiopharmaceutical. Ventilation scans intentionally deferred if perfusion scan and chest x-ray adequate for interpretation during COVID 19 epidemic. RADIOPHARMACEUTICALS:  4 mCi Tc-60mMAA IV COMPARISON:  Chest radiograph done earlier today FINDINGS: There are no segmental or subsegmental wedge-shaped perfusion defects. IMPRESSION: Normal perfusion lung scan. Electronically Signed   By: PElmer PickerM.D.   On: 05/29/2022 16:29   VAS [place]OWER EXTREMITY  VENOUS (DVT) (7a-7p)  Result Date: 05/29/2022  Lower Venous DVT Study Patient Name:  MJAUAN WOHL Date of Exam:   05/29/2022 Medical Rec #: [id number]      Accession #:    [id number] of Birth: [birth year]      Patient Gender: M Patient Age:   89years [facility]      VAS [place]OWER EXTREMITY VENOUS (DVT) Referring Phys: RHerbie BaltimoreLOCKWOOD --------------------------------------------------------------------------------  Indications: Swelling, and Edema.  Comparison Study: no prior Performing Technologist: MArchie PattenRVS  Examination Guidelines: A complete evaluation includes B-mode imaging, spectral Doppler, color Doppler, and power Doppler as needed of all accessible portions of each vessel. Bilateral testing is considered an integral part of a complete examination. Limited examinations for reoccurring indications may be performed as noted. The reflux portion of the exam is performed with the patient in reverse Trendelenburg.  +---------+---------------+---------+-----------+----------+--------------+ RIGHT    CompressibilityPhasicitySpontaneityPropertiesThrombus Aging +---------+---------------+---------+-----------+----------+--------------+ CFV      Full           Yes      Yes                                 +---------+---------------+---------+-----------+----------+--------------+ SFJ      Full                                                        +---------+---------------+---------+-----------+----------+--------------+ FV Prox  Full                                                        +---------+---------------+---------+-----------+----------+--------------+ FV Mid   Full                                                        +---------+---------------+---------+-----------+----------+--------------+ FV DistalFull                                                         +---------+---------------+---------+-----------+----------+--------------+  PFV      Full                                                        +---------+---------------+---------+-----------+----------+--------------+ POP      Full           Yes      Yes                                 +---------+---------------+---------+-----------+----------+--------------+ PTV      Full                                                        +---------+---------------+---------+-----------+----------+--------------+ PERO     Full           Yes      Yes                                 +---------+---------------+---------+-----------+----------+--------------+   +---------+---------------+---------+-----------+----------+--------------+ LEFT     CompressibilityPhasicitySpontaneityPropertiesThrombus Aging +---------+---------------+---------+-----------+----------+--------------+ CFV      Full           Yes      Yes                                 +---------+---------------+---------+-----------+----------+--------------+ SFJ      Full                                                        +---------+---------------+---------+-----------+----------+--------------+ FV Prox  Full                                                        +---------+---------------+---------+-----------+----------+--------------+ FV Mid   Full                                                        +---------+---------------+---------+-----------+----------+--------------+ FV DistalFull                                                        +---------+---------------+---------+-----------+----------+--------------+ PFV      Full                                                        +---------+---------------+---------+-----------+----------+--------------+  POP      Full           Yes      Yes                                  +---------+---------------+---------+-----------+----------+--------------+ PTV      Full                                                        +---------+---------------+---------+-----------+----------+--------------+ PERO     Full                                                        +---------+---------------+---------+-----------+----------+--------------+     Summary: BILATERAL: - No evidence of deep vein thrombosis seen in the lower extremities, bilaterally. -No evidence of popliteal cyst, bilaterally.   *See table(s) above for measurements and observations. Electronically signed by Monica Martinez MD on 05/29/2022 at 2:57:05 PM.    Final    DG CHEST PORT 1 VIEW  Result Date: 05/29/2022 CLINICAL DATA:  Respiratory failure EXAM: PORTABLE CHEST 1 VIEW COMPARISON:  05/27/2022 FINDINGS: Right-sided implanted cardiac device. Stable heart size. Aortic atherosclerosis. Aeration of the lung fields appears improved compared to the previous CT. No lobar consolidation is evident radiographically. No pleural effusion or pneumothorax. IMPRESSION: Improved aeration of the lung fields compared to the previous CT. Electronically Signed   By: Davina Poke D.O.   On: 05/29/2022 10:18   CT CHEST ABDOMEN PELVIS WO CONTRAST  Result Date: 05/27/2022 CLINICAL DATA:  Shortness of breath with diffuse abdominal pain EXAM: CT CHEST, ABDOMEN AND PELVIS WITHOUT CONTRAST TECHNIQUE: Multidetector CT imaging of the chest, abdomen and pelvis was performed following the standard protocol without IV contrast. RADIATION DOSE REDUCTION: This exam was performed according to the departmental dose-optimization program which includes automated exposure control, adjustment of the mA and/or kV according to patient size and/or use of iterative reconstruction technique. COMPARISON:  CT AP 10/01/2021 FINDINGS: CT CHEST FINDINGS Cardiovascular: Normal heart size. Aortic atherosclerosis and coronary artery calcifications. Right  chest wall pacer device noted with leads in the right atrial appendage and right ventricle. No pericardial effusion. Mediastinum/Nodes: Thyroid gland, trachea, and esophagus demonstrate no significant findings. No enlarged axillary or mediastinal lymph nodes. Lungs/Pleura: No pleural fluid. Bilateral multifocal patchy areas of peribronchovascular ground-glass attenuation identified within the upper and lower lobes. This is most severe within the posterior and medial right lower lobe, image 82/5. No airspace consolidation Musculoskeletal: No chest wall mass or suspicious bone lesions identified. CT ABDOMEN PELVIS FINDINGS Hepatobiliary: No focal liver abnormality identified. Multiple gallstones are identified which measure up to 5 mm. No gallbladder wall thickening or pericholecystic fluid. Pancreas: No main duct dilatation, inflammation or mass. Scattered parenchymal calcifications identified. Spleen: Normal in size without focal abnormality. Adrenals/Urinary Tract: Normal appearance of the adrenal glands. Mild bilateral and symmetric renal cortical thinning and perinephric fat stranding. No mass. No nephrolithiasis or hydronephrosis. No signs of hydroureter or ureteral lithiasis. Bladder is unremarkable. Stomach/Bowel: Stomach is within normal limits. Appendix appears normal. No evidence of bowel wall thickening, distention, or inflammatory  changes. Vascular/Lymphatic: Aortic atherosclerosis. No abdominal adenopathy. Left perirectal lymph node is identified measuring 0.9 cm, image 100/6. Unchanged when compared with the previous exam. Reproductive: Seed implants noted within the prostate gland. Other: No free fluid or fluid collections. No signs of pneumoperitoneum. Fat containing umbilical hernia. Musculoskeletal: Multilevel spondylosis throughout the lumbar spine. No acute or suspicious osseous abnormality. IMPRESSION: 1. Bilateral multifocal patchy areas of peribronchovascular ground-glass attenuation identified  within the upper and lower lobes. This is most severe within the posterior and medial right lower lobe. Imaging features can be seen with (COVID-19) pneumonia, though are nonspecific and can occur with a variety of infectious and noninfectious processes. 2. No acute findings within the abdomen or pelvis. 3. Unchanged upper limits of normal left perirectal lymph node. 4. Gallstones. 5. Fat containing umbilical hernia. 6.  Aortic Atherosclerosis (ICD10-I70.0). Electronically Signed   By: Kerby Moors M.D.   On: 05/27/2022 18:27   DG Chest 2 View  Result Date: 05/27/2022 CLINICAL DATA:  Fatigue with shortness of breath for a couple days. Suspected sepsis. EXAM: CHEST - 2 VIEW COMPARISON:  Radiographs 05/21/2022 and 03/04/2022. FINDINGS: Three views are submitted, limited by body habitus and suboptimal inspiration. Right subclavian pacemaker leads appear unchanged, projecting over the right atrium and right ventricle. Stable mild cardiomegaly. Stable asymmetric elevation of the right hemidiaphragm with associated chronic bibasilar atelectasis. No definite superimposed airspace disease, edema, significant pleural effusion or pneumothorax. No acute osseous findings are evident. Telemetry leads overlie the chest. IMPRESSION: No evidence of acute cardiopulmonary process. Chronic bibasilar atelectasis and asymmetric elevation of the right hemidiaphragm. Electronically Signed   By: Richardean Sale M.D.   On: 05/27/2022 16:52   CT Head Wo Contrast  Result Date: 05/21/2022 CLINICAL DATA:  Mental status change, unknown cause EXAM: CT HEAD WITHOUT CONTRAST TECHNIQUE: Contiguous axial images were obtained from the base of the skull through the vertex without intravenous contrast. RADIATION DOSE REDUCTION: This exam was performed according to the departmental dose-optimization program which includes automated exposure control, adjustment of the mA and/or kV according to patient size and/or use of iterative reconstruction  technique. COMPARISON:  03/04/2022 FINDINGS: Brain: No evidence of acute infarction, hemorrhage, hydrocephalus, extra-axial collection or mass lesion/mass effect. Patchy low-density changes within the periventricular and subcortical white matter compatible with chronic microvascular ischemic change. Mild diffuse cerebral volume loss. Vascular: Atherosclerotic calcifications involving the large vessels of the skull base. No unexpected hyperdense vessel. Skull: Normal. Negative for fracture or focal lesion. Sinuses/Orbits: Mucosal thickening of the bilateral maxillary sinuses and ethmoid air cells. Other: None. IMPRESSION: 1. No acute intracranial findings. 2. Chronic microvascular ischemic change and cerebral volume loss. 3. Paranasal sinus disease. Electronically Signed   By: Davina Poke D.O.   On: 05/21/2022 13:48   DG Chest 2 View  Result Date: 05/21/2022 CLINICAL DATA:  COVID positive EXAM: CHEST - 2 VIEW COMPARISON:  03/04/2022 FINDINGS: Cardiac silhouette is prominent. There is bibasilar linear subsegmental atelectasis. No pneumothorax. Normal pulmonary vasculature. Calcified aorta. Right-sided pacer in place. IMPRESSION: Enlarged cardiac silhouette. Minimal bibasilar subsegmental atelectasis or consolidation. Electronically Signed   By: Sammie Bench M.D.   On: 05/21/2022 13:41      Subjective:  No significant events overnight, he still reports some constipation, he had an episode of hypoglycemia early afternoon resolved after drinking some Ensure.  Discharge Exam: Vitals:   06/01/22 0830 06/01/22 1225  BP: (!) 113/99 (!) 145/100  Pulse: 74 87  Resp: 18 (!) 22  Temp: 98.2 F (36.8  C) 98.1 F (36.7 C)  SpO2: 93% 98%   Vitals:   05/31/22 2149 06/01/22 0400 06/01/22 0830 06/01/22 1225  BP: 101/70 111/68 (!) 113/99 (!) 145/100  Pulse: 64 66 74 87  Resp: '20 17 18 '$ (!) 22  Temp: 97.8 F (36.6 C) 97.9 F (36.6 C) 98.2 F (36.8 C) 98.1 F (36.7 C)  TempSrc: Oral Oral Oral Oral   SpO2: 94% 95% 93% 98%  Weight:      Height:        General: Pt is alert, awake, not in acute distress Cardiovascular: RRR, S1/S2 +, no rubs, no gallops Respiratory: CTA bilaterally, no wheezing, no rhonchi Abdominal: Soft, NT, ND, bowel sounds + Extremities: no edema, no cyanosis    The results of significant diagnostics from this hospitalization (including imaging, microbiology, ancillary and laboratory) are listed below for reference.     Microbiology: Recent Results (from the past 240 hour(s))  Culture, blood (Routine x 2)     Status: None   Collection Time: 05/27/22  3:26 PM   Specimen: BLOOD RIGHT ARM  Result Value Ref Range Status   Specimen Description BLOOD RIGHT ARM  Final   Special Requests   Final    BOTTLES DRAWN AEROBIC AND ANAEROBIC Blood Culture results may not be optimal due to an inadequate volume of blood received in culture bottles   Culture   Final    NO GROWTH 5 DAYS Performed at [facility], [place] [address] [place], [place] [address]    Report Status 06/01/2022 FINAL  Final  Culture, blood (Routine x 2)     Status: None   Collection Time: 05/27/22  3:31 PM   Specimen: BLOOD LEFT ARM  Result Value Ref Range Status   Specimen Description BLOOD LEFT ARM  Final   Special Requests   Final    BOTTLES DRAWN AEROBIC AND ANAEROBIC Blood Culture adequate volume   Culture   Final    NO GROWTH 5 DAYS Performed at [facility], [place] [address] [place], [place] [address]    Report Status 06/01/2022 FINAL  Final  Respiratory (~20 pathogens) panel by PCR     Status: None   Collection Time: 05/27/22  9:21 PM   Specimen: Nasopharyngeal Swab; Respiratory  Result Value Ref Range Status   Adenovirus NOT DETECTED NOT DETECTED Final   Coronavirus 229E NOT DETECTED NOT DETECTED Final    Comment: (NOTE) The Coronavirus on the Respiratory Panel, DOES NOT test for the novel  Coronavirus (2019 nCoV)    Coronavirus HKU1 NOT DETECTED NOT DETECTED Final    Coronavirus NL63 NOT DETECTED NOT DETECTED Final   Coronavirus OC43 NOT DETECTED NOT DETECTED Final   Metapneumovirus NOT DETECTED NOT DETECTED Final   Rhinovirus / Enterovirus NOT DETECTED NOT DETECTED Final   Influenza A NOT DETECTED NOT DETECTED Final   Influenza B NOT DETECTED NOT DETECTED Final   Parainfluenza Virus 1 NOT DETECTED NOT DETECTED Final   Parainfluenza Virus 2 NOT DETECTED NOT DETECTED Final   Parainfluenza Virus 3 NOT DETECTED NOT DETECTED Final   Parainfluenza Virus 4 NOT DETECTED NOT DETECTED Final   Respiratory Syncytial Virus NOT DETECTED NOT DETECTED Final   Bordetella pertussis NOT DETECTED NOT DETECTED Final   Bordetella Parapertussis NOT DETECTED NOT DETECTED Final   Chlamydophila pneumoniae NOT DETECTED NOT DETECTED Final   Mycoplasma pneumoniae NOT DETECTED NOT DETECTED Final    Comment: Performed at [facility], [place],  [place] [address]  Culture, blood (Routine X 2) w Reflex to ID Panel     Status: Abnormal   Collection Time: 05/27/22 10:30 PM   Specimen: BLOOD  Result Value Ref Range Status   Specimen Description BLOOD RIGHT ANTECUBITAL  Final   Special Requests   Final    BOTTLES DRAWN AEROBIC AND ANAEROBIC Blood Culture adequate volume   Culture  Setup Time   Final    AEROBIC BOTTLE ONLY GRAM POSITIVE COCCI IN CLUSTERS CRITICAL VALUE NOTED.  VALUE IS CONSISTENT WITH PREVIOUSLY REPORTED AND CALLED VALUE.    Culture (A)  Final    STAPHYLOCOCCUS EPIDERMIDIS STAPHYLOCOCCUS HOMINIS SUSCEPTIBILITIES PERFORMED ON PREVIOUS CULTURE WITHIN THE LAST 5 DAYS. Performed at [facility], [place] [address], [place], [place] [address]    Report Status 06/01/2022 FINAL  Final  Culture, blood (Routine X 2) w Reflex to ID Panel     Status: Abnormal   Collection Time: 05/27/22 10:30 PM   Specimen: BLOOD LEFT HAND  Result Value Ref Range Status   Specimen Description BLOOD LEFT HAND  Final   Special Requests   Final    BOTTLES DRAWN  AEROBIC AND ANAEROBIC Blood Culture adequate volume   Culture  Setup Time   Final    AEROBIC BOTTLE ONLY GRAM POSITIVE COCCI IN CLUSTERS CRITICAL RESULT CALLED TO, READ BACK BY AND VERIFIED WITH:  C/ PHARMD L. CURRAN 05/28/22 1639 A. LAFRANCE Performed at [facility], [place] [address], [place], [place] [address]    Culture (A)  Final    STAPHYLOCOCCUS HOMINIS STAPHYLOCOCCUS EPIDERMIDIS    Report Status 06/01/2022 FINAL  Final   Organism ID, Bacteria STAPHYLOCOCCUS HOMINIS  Final   Organism ID, Bacteria STAPHYLOCOCCUS EPIDERMIDIS  Final      Susceptibility   Staphylococcus epidermidis - MIC*    CIPROFLOXACIN <=0.5 SENSITIVE Sensitive     ERYTHROMYCIN <=0.25 SENSITIVE Sensitive     GENTAMICIN <=0.5 SENSITIVE Sensitive     OXACILLIN >=4 RESISTANT Resistant     TETRACYCLINE 2 SENSITIVE Sensitive     VANCOMYCIN 2 SENSITIVE Sensitive     TRIMETH/SULFA <=10 SENSITIVE Sensitive     CLINDAMYCIN <=0.25 SENSITIVE Sensitive     RIFAMPIN <=0.5 SENSITIVE Sensitive     Inducible Clindamycin NEGATIVE Sensitive     * STAPHYLOCOCCUS EPIDERMIDIS   Staphylococcus hominis - MIC*    CIPROFLOXACIN <=0.5 SENSITIVE Sensitive     ERYTHROMYCIN >=8 RESISTANT Resistant     GENTAMICIN <=0.5 SENSITIVE Sensitive     OXACILLIN >=4 RESISTANT Resistant     TETRACYCLINE >=16 RESISTANT Resistant     VANCOMYCIN <=0.5 SENSITIVE Sensitive     TRIMETH/SULFA <=10 SENSITIVE Sensitive     CLINDAMYCIN <=0.25 SENSITIVE Sensitive     RIFAMPIN <=0.5 SENSITIVE Sensitive     Inducible Clindamycin NEGATIVE Sensitive     * STAPHYLOCOCCUS HOMINIS  Blood Culture ID Panel (Reflexed)     Status: Abnormal   Collection Time: 05/27/22 10:30 PM  Result Value Ref Range Status   Enterococcus faecalis NOT DETECTED NOT DETECTED Final   Enterococcus Faecium NOT DETECTED NOT DETECTED Final   Listeria monocytogenes NOT DETECTED NOT DETECTED Final   Staphylococcus species DETECTED (A) NOT DETECTED Final    Comment: CRITICAL RESULT  CALLED TO, READ BACK BY AND VERIFIED WITH:  C/ PHARMD L. CURRAN 05/28/22 1639 A. LAFRANCE    Staphylococcus aureus (BCID) NOT DETECTED NOT DETECTED Final   Staphylococcus epidermidis DETECTED (A) NOT DETECTED Final    Comment:  Methicillin (oxacillin) resistant coagulase negative staphylococcus. Possible blood culture contaminant (unless isolated from more than one blood culture draw or clinical case suggests pathogenicity). No antibiotic treatment is indicated for blood  culture contaminants. CRITICAL RESULT CALLED TO, READ BACK BY AND VERIFIED WITH:  C/ PHARMD L. CURRAN 05/28/22 1639 A. LAFRANCE    Staphylococcus lugdunensis NOT DETECTED NOT DETECTED Final   Streptococcus species NOT DETECTED NOT DETECTED Final   Streptococcus agalactiae NOT DETECTED NOT DETECTED Final   Streptococcus pneumoniae NOT DETECTED NOT DETECTED Final   Streptococcus pyogenes NOT DETECTED NOT DETECTED Final   A.calcoaceticus-baumannii NOT DETECTED NOT DETECTED Final   Bacteroides fragilis NOT DETECTED NOT DETECTED Final   Enterobacterales NOT DETECTED NOT DETECTED Final   Enterobacter cloacae complex NOT DETECTED NOT DETECTED Final   Escherichia coli NOT DETECTED NOT DETECTED Final   Klebsiella aerogenes NOT DETECTED NOT DETECTED Final   Klebsiella oxytoca NOT DETECTED NOT DETECTED Final   Klebsiella pneumoniae NOT DETECTED NOT DETECTED Final   Proteus species NOT DETECTED NOT DETECTED Final   Salmonella species NOT DETECTED NOT DETECTED Final   Serratia marcescens NOT DETECTED NOT DETECTED Final   Haemophilus influenzae NOT DETECTED NOT DETECTED Final   Neisseria meningitidis NOT DETECTED NOT DETECTED Final   Pseudomonas aeruginosa NOT DETECTED NOT DETECTED Final   Stenotrophomonas maltophilia NOT DETECTED NOT DETECTED Final   Candida albicans NOT DETECTED NOT DETECTED Final   Candida auris NOT DETECTED NOT DETECTED Final   Candida glabrata NOT DETECTED NOT DETECTED Final   Candida krusei NOT DETECTED NOT  DETECTED Final   Candida parapsilosis NOT DETECTED NOT DETECTED Final   Candida tropicalis NOT DETECTED NOT DETECTED Final   Cryptococcus neoformans/gattii NOT DETECTED NOT DETECTED Final   Methicillin resistance mecA/C DETECTED (A) NOT DETECTED Final    Comment: CRITICAL RESULT CALLED TO, READ BACK BY AND VERIFIED WITH:  C/ PHARMD L. CURRAN 05/28/22 1639 A. LAFRANCE Performed at [facility], [place] [address], [place], [place] [address]   Culture, blood (Routine X 2) w Reflex to ID Panel     Status: None (Preliminary result)   Collection Time: 05/29/22  3:19 PM   Specimen: BLOOD  Result Value Ref Range Status   Specimen Description BLOOD SITE NOT SPECIFIED  Final   Special Requests   Final    BOTTLES DRAWN AEROBIC AND ANAEROBIC Blood Culture adequate volume   Culture   Final    NO GROWTH 3 DAYS Performed at [facility], [address], [place], [place] [address]    Report Status PENDING  Incomplete  Culture, blood (Routine X 2) w Reflex to ID Panel     Status: None (Preliminary result)   Collection Time: 05/29/22  3:19 PM   Specimen: BLOOD RIGHT HAND  Result Value Ref Range Status   Specimen Description BLOOD RIGHT HAND  Final   Special Requests   Final    IN PEDIATRIC BOTTLE Blood Culture results may not be optimal due to an inadequate volume of blood received in culture bottles   Culture   Final    NO GROWTH 3 DAYS Performed at [facility], [place] [address] [place],  [address]    Report Status PENDING  Incomplete     Labs: BNP (last 3 results) Recent Labs    05/21/22 1448 05/27/22 2230  BNP 94.6 408.1*   Basic Metabolic Panel: Recent Labs  Lab 05/28/22 0640 05/29/22 0348 05/30/22 0527 05/31/22 0430 06/01/22 4481  NA  135 135 139 137 141  K 4.1 4.6 4.6 4.6 4.4  CL 101 107 107 107 105  CO2 20* 22 21* 23 29  GLUCOSE 198* 182* 147* 99 100*  BUN 133* 111* 93* 92* 78*  CREATININE 3.17* 2.70* 2.63* 2.51* 2.34*  CALCIUM 8.3* 7.9* 8.1* 8.2*  8.5*  MG 2.6* 2.5* 2.6* 2.4 2.8*  PHOS 4.5 5.0* 5.7* 4.8* 4.2   Liver Function Tests: Recent Labs  Lab 05/28/22 0640 05/29/22 0348 05/30/22 0527 05/31/22 0430 06/01/22 0613  AST 24 36 37 35 44*  ALT '21 28 27 28 '$ 42  ALKPHOS 48 48 44 49 52  BILITOT 0.7 0.6 0.6 0.5 0.6  PROT 5.4* 5.5* 5.4* 5.4* 5.9*  ALBUMIN 2.4* 2.3* 2.4* 2.4* 2.7*   No results for input(s): "LIPASE", "AMYLASE" in the last 168 hours. No results for input(s): "AMMONIA" in the last 168 hours. CBC: Recent Labs  Lab 05/28/22 0640 05/29/22 0348 05/30/22 0527 05/31/22 0430 06/01/22 0613  WBC 19.5* 13.5* 13.1* 18.4* 17.5*  NEUTROABS 15.9* 12.4* 11.6* 16.8* 13.8*  HGB 11.3* 10.5* 9.8* 9.6* 10.1*  HCT 33.3* 32.5* 29.8* 28.3* 30.9*  MCV 89.8 92.1 92.5 91.6 92.2  PLT 213 180 183 196 199   Cardiac Enzymes: No results for input(s): "CKTOTAL", "CKMB", "CKMBINDEX", "TROPONINI" in the last 168 hours. BNP: Invalid input(s): "POCBNP" CBG: Recent Labs  Lab 05/31/22 2150 06/01/22 0831 06/01/22 1226 06/01/22 1442 06/01/22 1531  GLUCAP 84 108* 118* 56* 88   D-Dimer Recent Labs    05/31/22 0430 06/01/22 0613  DDIMER 0.73* 0.88*   Hgb A1c No results for input(s): "HGBA1C" in the last 72 hours. Lipid Profile No results for input(s): "CHOL", "HDL", "LDLCALC", "TRIG", "CHOLHDL", "LDLDIRECT" in the last 72 hours. Thyroid function studies No results for input(s): "TSH", "T4TOTAL", "T3FREE", "THYROIDAB" in the last 72 hours.  Invalid input(s): "FREET3" Anemia work up No results for input(s): "VITAMINB12", "FOLATE", "FERRITIN", "TIBC", "IRON", "RETICCTPCT" in the last 72 hours. Urinalysis    Component Value Date/Time   COLORURINE YELLOW 05/28/2022 0515   APPEARANCEUR HAZY (A) 05/28/2022 0515   LABSPEC 1.017 05/28/2022 0515   PHURINE 5.0 05/28/2022 0515   GLUCOSEU 50 (A) 05/28/2022 0515   HGBUR NEGATIVE 05/28/2022 0515   BILIRUBINUR NEGATIVE 05/28/2022 0515   KETONESUR NEGATIVE 05/28/2022 0515   PROTEINUR  NEGATIVE 05/28/2022 0515   UROBILINOGEN 0.2 06/29/2010 0930   NITRITE NEGATIVE 05/28/2022 0515   LEUKOCYTESUR NEGATIVE 05/28/2022 0515   Sepsis Labs Recent Labs  Lab 05/29/22 0348 05/30/22 0527 05/31/22 0430 06/01/22 0613  WBC 13.5* 13.1* 18.4* 17.5*   Microbiology Recent Results (from the past 240 hour(s))  Culture, blood (Routine x 2)     Status: None   Collection Time: 05/27/22  3:26 PM   Specimen: BLOOD RIGHT ARM  Result Value Ref Range Status   Specimen Description BLOOD RIGHT ARM  Final   Special Requests   Final    BOTTLES DRAWN AEROBIC AND ANAEROBIC Blood Culture results may not be optimal due to an inadequate volume of blood received in culture bottles   Culture   Final    NO GROWTH 5 DAYS Performed at [facility], [place] [address], [place], [place] [address]    Report Status 06/01/2022 FINAL  Final  Culture, blood (Routine x 2)     Status: None   Collection Time: 05/27/22  3:31 PM   Specimen: BLOOD LEFT ARM  Result Value Ref Range Status   Specimen Description BLOOD LEFT ARM  Final   Special Requests   Final    BOTTLES DRAWN AEROBIC AND ANAEROBIC Blood Culture adequate volume   Culture   Final    NO GROWTH 5 DAYS Performed at [facility], [address] [place], [place] [address]    Report Status 06/01/2022 FINAL  Final  Respiratory (~20 pathogens) panel by PCR     Status: None   Collection Time: 05/27/22  9:21 PM   Specimen: Nasopharyngeal Swab; Respiratory  Result Value Ref Range Status   Adenovirus NOT DETECTED NOT DETECTED Final   Coronavirus 229E NOT DETECTED NOT DETECTED Final    Comment: (NOTE) The Coronavirus on the Respiratory Panel, DOES NOT test for the novel  Coronavirus (2019 nCoV)    Coronavirus HKU1 NOT DETECTED NOT DETECTED Final   Coronavirus NL63 NOT DETECTED NOT DETECTED Final   Coronavirus OC43 NOT DETECTED NOT DETECTED Final   Metapneumovirus NOT DETECTED NOT DETECTED Final   Rhinovirus / Enterovirus NOT DETECTED NOT  DETECTED Final   Influenza A NOT DETECTED NOT DETECTED Final   Influenza B NOT DETECTED NOT DETECTED Final   Parainfluenza Virus 1 NOT DETECTED NOT DETECTED Final   Parainfluenza Virus 2 NOT DETECTED NOT DETECTED Final   Parainfluenza Virus 3 NOT DETECTED NOT DETECTED Final   Parainfluenza Virus 4 NOT DETECTED NOT DETECTED Final   Respiratory Syncytial Virus NOT DETECTED NOT DETECTED Final   Bordetella pertussis NOT DETECTED NOT DETECTED Final   Bordetella Parapertussis NOT DETECTED NOT DETECTED Final   Chlamydophila pneumoniae NOT DETECTED NOT DETECTED Final   Mycoplasma pneumoniae NOT DETECTED NOT DETECTED Final    Comment: Performed at [facility], [place] [address], [place], [place] [address]  Culture, blood (Routine X 2) w Reflex to ID Panel     Status: Abnormal   Collection Time: 05/27/22 10:30 PM   Specimen: BLOOD  Result Value Ref Range Status   Specimen Description BLOOD RIGHT ANTECUBITAL  Final   Special Requests   Final    BOTTLES DRAWN AEROBIC AND ANAEROBIC Blood Culture adequate volume   Culture  Setup Time   Final    AEROBIC BOTTLE ONLY GRAM POSITIVE COCCI IN CLUSTERS CRITICAL VALUE NOTED.  VALUE IS CONSISTENT WITH PREVIOUSLY REPORTED AND CALLED VALUE.    Culture (A)  Final    STAPHYLOCOCCUS EPIDERMIDIS STAPHYLOCOCCUS HOMINIS SUSCEPTIBILITIES PERFORMED ON PREVIOUS CULTURE WITHIN THE LAST 5 DAYS. Performed at [facility], [place] [address] [place], [place] [address]    Report Status 06/01/2022 FINAL  Final  Culture, blood (Routine X 2) w Reflex to ID Panel     Status: Abnormal   Collection Time: 05/27/22 10:30 PM   Specimen: BLOOD LEFT HAND  Result Value Ref Range Status   Specimen Description BLOOD LEFT HAND  Final   Special Requests   Final    BOTTLES DRAWN AEROBIC AND ANAEROBIC Blood Culture adequate volume   Culture  Setup Time   Final    AEROBIC BOTTLE ONLY GRAM POSITIVE COCCI IN CLUSTERS CRITICAL RESULT CALLED TO, READ BACK BY AND VERIFIED WITH:   C/ PHARMD L. CURRAN 05/28/22 1639 A. LAFRANCE Performed at [facility], [place] [address] [place], [place] [address]    Culture (A)  Final    STAPHYLOCOCCUS HOMINIS STAPHYLOCOCCUS EPIDERMIDIS    Report Status 06/01/2022 FINAL  Final   Organism ID, Bacteria STAPHYLOCOCCUS HOMINIS  Final   Organism ID, Bacteria STAPHYLOCOCCUS EPIDERMIDIS  Final      Susceptibility  Staphylococcus epidermidis - MIC*    CIPROFLOXACIN <=0.5 SENSITIVE Sensitive     ERYTHROMYCIN <=0.25 SENSITIVE Sensitive     GENTAMICIN <=0.5 SENSITIVE Sensitive     OXACILLIN >=4 RESISTANT Resistant     TETRACYCLINE 2 SENSITIVE Sensitive     VANCOMYCIN 2 SENSITIVE Sensitive     TRIMETH/SULFA <=10 SENSITIVE Sensitive     CLINDAMYCIN <=0.25 SENSITIVE Sensitive     RIFAMPIN <=0.5 SENSITIVE Sensitive     Inducible Clindamycin NEGATIVE Sensitive     * STAPHYLOCOCCUS EPIDERMIDIS   Staphylococcus hominis - MIC*    CIPROFLOXACIN <=0.5 SENSITIVE Sensitive     ERYTHROMYCIN >=8 RESISTANT Resistant     GENTAMICIN <=0.5 SENSITIVE Sensitive     OXACILLIN >=4 RESISTANT Resistant     TETRACYCLINE >=16 RESISTANT Resistant     VANCOMYCIN <=0.5 SENSITIVE Sensitive     TRIMETH/SULFA <=10 SENSITIVE Sensitive     CLINDAMYCIN <=0.25 SENSITIVE Sensitive     RIFAMPIN <=0.5 SENSITIVE Sensitive     Inducible Clindamycin NEGATIVE Sensitive     * STAPHYLOCOCCUS HOMINIS  Blood Culture ID Panel (Reflexed)     Status: Abnormal   Collection Time: 05/27/22 10:30 PM  Result Value Ref Range Status   Enterococcus faecalis NOT DETECTED NOT DETECTED Final   Enterococcus Faecium NOT DETECTED NOT DETECTED Final   Listeria monocytogenes NOT DETECTED NOT DETECTED Final   Staphylococcus species DETECTED (A) NOT DETECTED Final    Comment: CRITICAL RESULT CALLED TO, READ BACK BY AND VERIFIED WITH:  C/ PHARMD L. CURRAN 05/28/22 1639 A. LAFRANCE    Staphylococcus aureus (BCID) NOT DETECTED NOT DETECTED Final   Staphylococcus epidermidis DETECTED (A)  NOT DETECTED Final    Comment: Methicillin (oxacillin) resistant coagulase negative staphylococcus. Possible blood culture contaminant (unless isolated from more than one blood culture draw or clinical case suggests pathogenicity). No antibiotic treatment is indicated for blood  culture contaminants. CRITICAL RESULT CALLED TO, READ BACK BY AND VERIFIED WITH:  C/ PHARMD L. CURRAN 05/28/22 1639 A. LAFRANCE    Staphylococcus lugdunensis NOT DETECTED NOT DETECTED Final   Streptococcus species NOT DETECTED NOT DETECTED Final   Streptococcus agalactiae NOT DETECTED NOT DETECTED Final   Streptococcus pneumoniae NOT DETECTED NOT DETECTED Final   Streptococcus pyogenes NOT DETECTED NOT DETECTED Final   A.calcoaceticus-baumannii NOT DETECTED NOT DETECTED Final   Bacteroides fragilis NOT DETECTED NOT DETECTED Final   Enterobacterales NOT DETECTED NOT DETECTED Final   Enterobacter cloacae complex NOT DETECTED NOT DETECTED Final   Escherichia coli NOT DETECTED NOT DETECTED Final   Klebsiella aerogenes NOT DETECTED NOT DETECTED Final   Klebsiella oxytoca NOT DETECTED NOT DETECTED Final   Klebsiella pneumoniae NOT DETECTED NOT DETECTED Final   Proteus species NOT DETECTED NOT DETECTED Final   Salmonella species NOT DETECTED NOT DETECTED Final   Serratia marcescens NOT DETECTED NOT DETECTED Final   Haemophilus influenzae NOT DETECTED NOT DETECTED Final   Neisseria meningitidis NOT DETECTED NOT DETECTED Final   Pseudomonas aeruginosa NOT DETECTED NOT DETECTED Final   Stenotrophomonas maltophilia NOT DETECTED NOT DETECTED Final   Candida albicans NOT DETECTED NOT DETECTED Final   Candida auris NOT DETECTED NOT DETECTED Final   Candida glabrata NOT DETECTED NOT DETECTED Final   Candida krusei NOT DETECTED NOT DETECTED Final   Candida parapsilosis NOT DETECTED NOT DETECTED Final   Candida tropicalis NOT DETECTED NOT DETECTED Final   Cryptococcus neoformans/gattii NOT DETECTED NOT DETECTED Final    Methicillin resistance mecA/C DETECTED (A) NOT DETECTED Final  Comment: CRITICAL RESULT CALLED TO, READ BACK BY AND VERIFIED WITH:  C/ PHARMD L. CURRAN 05/28/22 1639 A. LAFRANCE Performed at [facility], [place] [address], [place], [place] [address]   Culture, blood (Routine X 2) w Reflex to ID Panel     Status: None (Preliminary result)   Collection Time: 05/29/22  3:19 PM   Specimen: BLOOD  Result Value Ref Range Status   Specimen Description BLOOD SITE NOT SPECIFIED  Final   Special Requests   Final    BOTTLES DRAWN AEROBIC AND ANAEROBIC Blood Culture adequate volume   Culture   Final    NO GROWTH 3 DAYS Performed at [facility], [address], [place], [place] [address]    Report Status PENDING  Incomplete  Culture, blood (Routine X 2) w Reflex to ID Panel     Status: None (Preliminary result)   Collection Time: 05/29/22  3:19 PM   Specimen: BLOOD RIGHT HAND  Result Value Ref Range Status   Specimen Description BLOOD RIGHT HAND  Final   Special Requests   Final    IN PEDIATRIC BOTTLE Blood Culture results may not be optimal due to an inadequate volume of blood received in culture bottles   Culture   Final    NO GROWTH 3 DAYS Performed at [facility], [place] [address] [place], [place] [address]    Report Status PENDING  Incomplete     Time coordinating discharge: Over 30 minutes  SIGNED:   Phillips Climes, MD  [facility] 06/01/2022, 3:52 PM Pager   If 7PM-7AM, please contact night-coverage [email or web address]

## 2022-06-03 LAB — CULTURE, BLOOD (ROUTINE X 2)
Culture: NO GROWTH
Culture: NO GROWTH
Special Requests: ADEQUATE

## 2022-06-05 ENCOUNTER — Encounter: Payer: Self-pay | Admitting: *Deleted

## 2022-06-05 ENCOUNTER — Telehealth: Payer: Self-pay | Admitting: *Deleted

## 2022-06-05 NOTE — Patient Instructions (Signed)
Visit Information  Thank you for taking time to visit with me today. Please don't hesitate to contact me if I can be of assistance to you.   Following are the goals we discussed today:   Goals Addressed             This Visit's Progress    COMPLETED: Hospital f/u appointment with PCP       Care Coordination Interventions: Advised patient to contact his provider and scheduled hospital f/u appointment over the next 2 weeks Reviewed medications with patient and discussed adherence with no refills needed Collaborated with Dr. Stephanie Acre regarding pt needing a hospital f/u appointment. Reviewed scheduled/upcoming provider appointments including sufficient  Screening for signs and symptoms of depression related to chronic disease state  Assessed social determinant of health barriers Educated on care management services with no needs presented at this time.           Please call the care guide team at [phone] if you need to cancel or reschedule your appointment.   If you are experiencing a Mental Health or [place] or need someone to talk to, please call the Suicide and Crisis Lifeline: 988  Patient verbalizes understanding of instructions and care plan provided today and agrees to view in [facility]. Active MyChart status and patient understanding of how to access instructions and care plan via MyChart confirmed with patient.     No further follow up required: No additional needs  Raina Mina, RN Care Management Coordinator [place] Office [phone]

## 2022-06-05 NOTE — Patient Outreach (Signed)
  Care Coordination   Initial Visit Note   06/05/2022 Name: LEAVY HEATHERLY MRN: [id number] DOB: 1931/09/02  MYLEN MANGAN is a 89 y.o. year old male who sees [name], MD for primary care. I spoke with  Consuela Mimes by phone today.  What matters to the patients health and wellness today?  F/u primary provider office visit post hospital discharge    Goals Addressed             This Visit's Progress    COMPLETED: Hospital f/u appointment with PCP       Care Coordination Interventions: Advised patient to contact his provider and scheduled hospital f/u appointment over the next 2 weeks Reviewed medications with patient and discussed adherence with no refills needed Collaborated with Dr. Stephanie Acre regarding pt needing a hospital f/u appointment. Reviewed scheduled/upcoming provider appointments including sufficient  Screening for signs and symptoms of depression related to chronic disease state  Assessed social determinant of health barriers Educated on care management services with no needs presented at this time.           SDOH assessments and interventions completed:  Yes  SDOH Interventions Today    Flowsheet Row Most Recent Value  SDOH Interventions   Food Insecurity Interventions Intervention Not Indicated  Housing Interventions Intervention Not Indicated  Transportation Interventions Intervention Not Indicated  Utilities Interventions Intervention Not Indicated        Care Coordination Interventions:  Yes, provided   Follow up plan: No further intervention required.   Encounter Outcome:  Pt. Visit Completed   Raina Mina, RN Care Management Coordinator [place] Office [phone]

## 2022-06-07 DIAGNOSIS — N184 Chronic kidney disease, stage 4 (severe): Secondary | ICD-10-CM | POA: Diagnosis not present

## 2022-06-07 DIAGNOSIS — I495 Sick sinus syndrome: Secondary | ICD-10-CM | POA: Diagnosis not present

## 2022-06-07 DIAGNOSIS — I129 Hypertensive chronic kidney disease with stage 1 through stage 4 chronic kidney disease, or unspecified chronic kidney disease: Secondary | ICD-10-CM | POA: Diagnosis not present

## 2022-06-07 DIAGNOSIS — E1122 Type 2 diabetes mellitus with diabetic chronic kidney disease: Secondary | ICD-10-CM | POA: Diagnosis not present

## 2022-06-07 DIAGNOSIS — N2581 Secondary hyperparathyroidism of renal origin: Secondary | ICD-10-CM | POA: Diagnosis not present

## 2022-06-08 DIAGNOSIS — E1122 Type 2 diabetes mellitus with diabetic chronic kidney disease: Secondary | ICD-10-CM | POA: Diagnosis not present

## 2022-06-08 DIAGNOSIS — E782 Mixed hyperlipidemia: Secondary | ICD-10-CM | POA: Diagnosis not present

## 2022-06-08 DIAGNOSIS — U071 COVID-19: Secondary | ICD-10-CM | POA: Diagnosis not present

## 2022-06-08 DIAGNOSIS — N179 Acute kidney failure, unspecified: Secondary | ICD-10-CM | POA: Diagnosis not present

## 2022-06-08 DIAGNOSIS — Z6835 Body mass index (BMI) 35.0-35.9, adult: Secondary | ICD-10-CM | POA: Diagnosis not present

## 2022-06-08 DIAGNOSIS — E1142 Type 2 diabetes mellitus with diabetic polyneuropathy: Secondary | ICD-10-CM | POA: Diagnosis not present

## 2022-06-08 DIAGNOSIS — K8020 Calculus of gallbladder without cholecystitis without obstruction: Secondary | ICD-10-CM | POA: Diagnosis not present

## 2022-06-08 DIAGNOSIS — K429 Umbilical hernia without obstruction or gangrene: Secondary | ICD-10-CM | POA: Diagnosis not present

## 2022-06-08 DIAGNOSIS — J9811 Atelectasis: Secondary | ICD-10-CM | POA: Diagnosis not present

## 2022-06-08 DIAGNOSIS — N184 Chronic kidney disease, stage 4 (severe): Secondary | ICD-10-CM | POA: Diagnosis not present

## 2022-06-08 DIAGNOSIS — I5042 Chronic combined systolic (congestive) and diastolic (congestive) heart failure: Secondary | ICD-10-CM | POA: Diagnosis not present

## 2022-06-08 DIAGNOSIS — I447 Left bundle-branch block, unspecified: Secondary | ICD-10-CM | POA: Diagnosis not present

## 2022-06-08 DIAGNOSIS — I442 Atrioventricular block, complete: Secondary | ICD-10-CM | POA: Diagnosis not present

## 2022-06-08 DIAGNOSIS — K219 Gastro-esophageal reflux disease without esophagitis: Secondary | ICD-10-CM | POA: Diagnosis not present

## 2022-06-08 DIAGNOSIS — I495 Sick sinus syndrome: Secondary | ICD-10-CM | POA: Diagnosis not present

## 2022-06-08 DIAGNOSIS — J9601 Acute respiratory failure with hypoxia: Secondary | ICD-10-CM | POA: Diagnosis not present

## 2022-06-08 DIAGNOSIS — H903 Sensorineural hearing loss, bilateral: Secondary | ICD-10-CM | POA: Diagnosis not present

## 2022-06-08 DIAGNOSIS — G4733 Obstructive sleep apnea (adult) (pediatric): Secondary | ICD-10-CM | POA: Diagnosis not present

## 2022-06-08 DIAGNOSIS — I700 Atherosclerosis of aorta: Secondary | ICD-10-CM | POA: Diagnosis not present

## 2022-06-08 DIAGNOSIS — I255 Ischemic cardiomyopathy: Secondary | ICD-10-CM | POA: Diagnosis not present

## 2022-06-08 DIAGNOSIS — F01A0 Vascular dementia, mild, without behavioral disturbance, psychotic disturbance, mood disturbance, and anxiety: Secondary | ICD-10-CM | POA: Diagnosis not present

## 2022-06-08 DIAGNOSIS — I2510 Atherosclerotic heart disease of native coronary artery without angina pectoris: Secondary | ICD-10-CM | POA: Diagnosis not present

## 2022-06-08 DIAGNOSIS — I959 Hypotension, unspecified: Secondary | ICD-10-CM | POA: Diagnosis not present

## 2022-06-08 DIAGNOSIS — E039 Hypothyroidism, unspecified: Secondary | ICD-10-CM | POA: Diagnosis not present

## 2022-06-12 DIAGNOSIS — E1122 Type 2 diabetes mellitus with diabetic chronic kidney disease: Secondary | ICD-10-CM | POA: Diagnosis not present

## 2022-06-12 DIAGNOSIS — I5042 Chronic combined systolic (congestive) and diastolic (congestive) heart failure: Secondary | ICD-10-CM | POA: Diagnosis not present

## 2022-06-12 DIAGNOSIS — I255 Ischemic cardiomyopathy: Secondary | ICD-10-CM | POA: Diagnosis not present

## 2022-06-12 DIAGNOSIS — J9601 Acute respiratory failure with hypoxia: Secondary | ICD-10-CM | POA: Diagnosis not present

## 2022-06-12 DIAGNOSIS — U071 COVID-19: Secondary | ICD-10-CM | POA: Diagnosis not present

## 2022-06-12 DIAGNOSIS — I2510 Atherosclerotic heart disease of native coronary artery without angina pectoris: Secondary | ICD-10-CM | POA: Diagnosis not present

## 2022-06-13 DIAGNOSIS — E1122 Type 2 diabetes mellitus with diabetic chronic kidney disease: Secondary | ICD-10-CM | POA: Diagnosis not present

## 2022-06-13 DIAGNOSIS — I255 Ischemic cardiomyopathy: Secondary | ICD-10-CM | POA: Diagnosis not present

## 2022-06-13 DIAGNOSIS — I5042 Chronic combined systolic (congestive) and diastolic (congestive) heart failure: Secondary | ICD-10-CM | POA: Diagnosis not present

## 2022-06-13 DIAGNOSIS — U071 COVID-19: Secondary | ICD-10-CM | POA: Diagnosis not present

## 2022-06-13 DIAGNOSIS — E162 Hypoglycemia, unspecified: Secondary | ICD-10-CM | POA: Diagnosis not present

## 2022-06-13 DIAGNOSIS — J9601 Acute respiratory failure with hypoxia: Secondary | ICD-10-CM | POA: Diagnosis not present

## 2022-06-13 DIAGNOSIS — J189 Pneumonia, unspecified organism: Secondary | ICD-10-CM | POA: Diagnosis not present

## 2022-06-13 DIAGNOSIS — I2510 Atherosclerotic heart disease of native coronary artery without angina pectoris: Secondary | ICD-10-CM | POA: Diagnosis not present

## 2022-06-15 DIAGNOSIS — I5042 Chronic combined systolic (congestive) and diastolic (congestive) heart failure: Secondary | ICD-10-CM | POA: Diagnosis not present

## 2022-06-15 DIAGNOSIS — E1122 Type 2 diabetes mellitus with diabetic chronic kidney disease: Secondary | ICD-10-CM | POA: Diagnosis not present

## 2022-06-15 DIAGNOSIS — I2510 Atherosclerotic heart disease of native coronary artery without angina pectoris: Secondary | ICD-10-CM | POA: Diagnosis not present

## 2022-06-15 DIAGNOSIS — I255 Ischemic cardiomyopathy: Secondary | ICD-10-CM | POA: Diagnosis not present

## 2022-06-15 DIAGNOSIS — U071 COVID-19: Secondary | ICD-10-CM | POA: Diagnosis not present

## 2022-06-15 DIAGNOSIS — J9601 Acute respiratory failure with hypoxia: Secondary | ICD-10-CM | POA: Diagnosis not present

## 2022-06-19 DIAGNOSIS — J9601 Acute respiratory failure with hypoxia: Secondary | ICD-10-CM | POA: Diagnosis not present

## 2022-06-19 DIAGNOSIS — I255 Ischemic cardiomyopathy: Secondary | ICD-10-CM | POA: Diagnosis not present

## 2022-06-19 DIAGNOSIS — U071 COVID-19: Secondary | ICD-10-CM | POA: Diagnosis not present

## 2022-06-19 DIAGNOSIS — I2510 Atherosclerotic heart disease of native coronary artery without angina pectoris: Secondary | ICD-10-CM | POA: Diagnosis not present

## 2022-06-19 DIAGNOSIS — I5042 Chronic combined systolic (congestive) and diastolic (congestive) heart failure: Secondary | ICD-10-CM | POA: Diagnosis not present

## 2022-06-19 DIAGNOSIS — E1122 Type 2 diabetes mellitus with diabetic chronic kidney disease: Secondary | ICD-10-CM | POA: Diagnosis not present

## 2022-06-20 DIAGNOSIS — U071 COVID-19: Secondary | ICD-10-CM | POA: Diagnosis not present

## 2022-06-20 DIAGNOSIS — E1122 Type 2 diabetes mellitus with diabetic chronic kidney disease: Secondary | ICD-10-CM | POA: Diagnosis not present

## 2022-06-20 DIAGNOSIS — I255 Ischemic cardiomyopathy: Secondary | ICD-10-CM | POA: Diagnosis not present

## 2022-06-20 DIAGNOSIS — J9601 Acute respiratory failure with hypoxia: Secondary | ICD-10-CM | POA: Diagnosis not present

## 2022-06-20 DIAGNOSIS — I5042 Chronic combined systolic (congestive) and diastolic (congestive) heart failure: Secondary | ICD-10-CM | POA: Diagnosis not present

## 2022-06-20 DIAGNOSIS — I2510 Atherosclerotic heart disease of native coronary artery without angina pectoris: Secondary | ICD-10-CM | POA: Diagnosis not present

## 2022-06-21 DIAGNOSIS — E1122 Type 2 diabetes mellitus with diabetic chronic kidney disease: Secondary | ICD-10-CM | POA: Diagnosis not present

## 2022-06-21 DIAGNOSIS — I2510 Atherosclerotic heart disease of native coronary artery without angina pectoris: Secondary | ICD-10-CM | POA: Diagnosis not present

## 2022-06-21 DIAGNOSIS — U071 COVID-19: Secondary | ICD-10-CM | POA: Diagnosis not present

## 2022-06-21 DIAGNOSIS — I255 Ischemic cardiomyopathy: Secondary | ICD-10-CM | POA: Diagnosis not present

## 2022-06-21 DIAGNOSIS — J9601 Acute respiratory failure with hypoxia: Secondary | ICD-10-CM | POA: Diagnosis not present

## 2022-06-21 DIAGNOSIS — I5042 Chronic combined systolic (congestive) and diastolic (congestive) heart failure: Secondary | ICD-10-CM | POA: Diagnosis not present

## 2022-06-22 DIAGNOSIS — I255 Ischemic cardiomyopathy: Secondary | ICD-10-CM | POA: Diagnosis not present

## 2022-06-22 DIAGNOSIS — J9601 Acute respiratory failure with hypoxia: Secondary | ICD-10-CM | POA: Diagnosis not present

## 2022-06-22 DIAGNOSIS — I5042 Chronic combined systolic (congestive) and diastolic (congestive) heart failure: Secondary | ICD-10-CM | POA: Diagnosis not present

## 2022-06-22 DIAGNOSIS — E1122 Type 2 diabetes mellitus with diabetic chronic kidney disease: Secondary | ICD-10-CM | POA: Diagnosis not present

## 2022-06-22 DIAGNOSIS — I2510 Atherosclerotic heart disease of native coronary artery without angina pectoris: Secondary | ICD-10-CM | POA: Diagnosis not present

## 2022-06-22 DIAGNOSIS — U071 COVID-19: Secondary | ICD-10-CM | POA: Diagnosis not present

## 2022-06-25 ENCOUNTER — Encounter (HOSPITAL_COMMUNITY): Payer: Self-pay | Admitting: Emergency Medicine

## 2022-06-25 ENCOUNTER — Emergency Department (HOSPITAL_COMMUNITY)
Admission: EM | Admit: 2022-06-25 | Discharge: 2022-06-26 | Disposition: A | Discharge status: Home / Self Care | Payer: Medicare Other | Attending: Emergency Medicine | Admitting: Emergency Medicine

## 2022-06-25 ENCOUNTER — Other Ambulatory Visit: Payer: Self-pay

## 2022-06-25 DIAGNOSIS — Z794 Long term (current) use of insulin: Secondary | ICD-10-CM | POA: Diagnosis not present

## 2022-06-25 DIAGNOSIS — D649 Anemia, unspecified: Secondary | ICD-10-CM

## 2022-06-25 DIAGNOSIS — I5042 Chronic combined systolic (congestive) and diastolic (congestive) heart failure: Secondary | ICD-10-CM | POA: Diagnosis not present

## 2022-06-25 DIAGNOSIS — N189 Chronic kidney disease, unspecified: Secondary | ICD-10-CM | POA: Diagnosis not present

## 2022-06-25 DIAGNOSIS — N289 Disorder of kidney and ureter, unspecified: Secondary | ICD-10-CM

## 2022-06-25 DIAGNOSIS — Z950 Presence of cardiac pacemaker: Secondary | ICD-10-CM | POA: Diagnosis not present

## 2022-06-25 DIAGNOSIS — E1122 Type 2 diabetes mellitus with diabetic chronic kidney disease: Secondary | ICD-10-CM | POA: Diagnosis not present

## 2022-06-25 DIAGNOSIS — R748 Abnormal levels of other serum enzymes: Secondary | ICD-10-CM | POA: Diagnosis not present

## 2022-06-25 DIAGNOSIS — Z7902 Long term (current) use of antithrombotics/antiplatelets: Secondary | ICD-10-CM | POA: Diagnosis not present

## 2022-06-25 DIAGNOSIS — K5641 Fecal impaction: Secondary | ICD-10-CM | POA: Diagnosis not present

## 2022-06-25 DIAGNOSIS — Z7982 Long term (current) use of aspirin: Secondary | ICD-10-CM | POA: Diagnosis not present

## 2022-06-25 DIAGNOSIS — K5900 Constipation, unspecified: Secondary | ICD-10-CM | POA: Diagnosis present

## 2022-06-25 LAB — COMPREHENSIVE METABOLIC PANEL
ALT: 56 U/L — ABNORMAL HIGH (ref 0–44)
AST: 53 U/L — ABNORMAL HIGH (ref 15–41)
Albumin: 2.5 g/dL — ABNORMAL LOW (ref 3.5–5.0)
Alkaline Phosphatase: 65 U/L (ref 38–126)
Anion gap: 10 (ref 5–15)
BUN: 42 mg/dL — ABNORMAL HIGH (ref 8–23)
CO2: 25 mmol/L (ref 22–32)
Calcium: 9.1 mg/dL (ref 8.9–10.3)
Chloride: 103 mmol/L (ref 98–111)
Creatinine, Ser: 2.05 mg/dL — ABNORMAL HIGH (ref 0.61–1.24)
GFR, Estimated: 31 mL/min — ABNORMAL LOW (ref >60)
Glucose, Bld: 196 mg/dL — ABNORMAL HIGH (ref 70–99)
Potassium: 4.4 mmol/L (ref 3.5–5.1)
Sodium: 138 mmol/L (ref 135–145)
Total Bilirubin: 0.6 mg/dL (ref 0.3–1.2)
Total Protein: 7.3 g/dL (ref 6.5–8.1)

## 2022-06-25 LAB — CBC
HCT: 33 % — ABNORMAL LOW (ref 39.0–52.0)
Hemoglobin: 10.4 g/dL — ABNORMAL LOW (ref 13.0–17.0)
MCH: 29.6 pg (ref 26.0–34.0)
MCHC: 31.5 g/dL (ref 30.0–36.0)
MCV: 94 fL (ref 80.0–100.0)
Platelets: 298 10*3/uL (ref 150–400)
RBC: 3.51 MIL/uL — ABNORMAL LOW (ref 4.22–5.81)
RDW: 14.8 % (ref 11.5–15.5)
WBC: 8.4 10*3/uL (ref 4.0–10.5)
nRBC: 0 % (ref 0.0–0.2)

## 2022-06-25 LAB — LIPASE, BLOOD: Lipase: 53 U/L — ABNORMAL HIGH (ref 11–51)

## 2022-06-25 NOTE — ED Triage Notes (Signed)
Pt states he has a fecal impaction. Last BM 3 days ago. Tried miralax with no relief. Endorses abdominal pain.

## 2022-06-26 DIAGNOSIS — J9601 Acute respiratory failure with hypoxia: Secondary | ICD-10-CM | POA: Diagnosis not present

## 2022-06-26 DIAGNOSIS — E1122 Type 2 diabetes mellitus with diabetic chronic kidney disease: Secondary | ICD-10-CM | POA: Diagnosis not present

## 2022-06-26 DIAGNOSIS — I2510 Atherosclerotic heart disease of native coronary artery without angina pectoris: Secondary | ICD-10-CM | POA: Diagnosis not present

## 2022-06-26 DIAGNOSIS — I5042 Chronic combined systolic (congestive) and diastolic (congestive) heart failure: Secondary | ICD-10-CM | POA: Diagnosis not present

## 2022-06-26 DIAGNOSIS — U071 COVID-19: Secondary | ICD-10-CM | POA: Diagnosis not present

## 2022-06-26 DIAGNOSIS — I255 Ischemic cardiomyopathy: Secondary | ICD-10-CM | POA: Diagnosis not present

## 2022-06-26 MED ORDER — GOLYTELY 236 G PO SOLR: 4.0000 L | Freq: Once | ORAL | 0 refills | Status: AC

## 2022-06-26 NOTE — ED Provider Notes (Signed)
[facility] Provider Note   CSN: [id number] Arrival date & time: 06/25/22  2244     History  Chief Complaint  Patient presents with   Constipation    [name] is a 89 y.o. male.  The history is provided by the patient.  Constipation He has history of diabetes, hyperlipidemia, chronic kidney disease, cardiac pacemaker, combined systolic and diastolic heart failure, and comes in because of constipation and inability to have a bowel movement for the last 3 days.  He has chronic constipation and was on daily polyethylene glycol.  However, he was doing well and he thought that he might be able to stop it and continue to have bowel movements.  Since stopping it, he has not been able to have a bowel movement at all.  He also took a dose of bisacodyl with no relief.  He can feel stool at the anus, but is unable to pass it and was unable to pass it.  He denies abdominal pain, nausea, vomiting.   Home Medications Prior to Admission medications   Medication Sig Start Date End Date Taking? Authorizing Provider  acetaminophen (TYLENOL) 500 MG tablet Take 500 mg by mouth 2 (two) times daily as needed for moderate pain.    [provider]  aspirin 81 MG tablet Take 81 mg by mouth every evening.     [provider]  bisacodyl (DULCOLAX) 5 MG EC tablet Take 5 mg by mouth daily as needed for severe constipation.    [provider]  clopidogrel (PLAVIX) 75 MG tablet Take 1 tablet by mouth once daily Patient taking differently: Take 75 mg by mouth every evening. 04/10/22   Croitoru, Mihai, MD  Coenzyme Q10 (COQ10) 150 MG CAPS Take 150 mg by mouth every evening.    [provider]  docusate sodium (COLACE) 100 MG capsule Take 100 mg by mouth daily.    [provider]  levothyroxine (SYNTHROID, LEVOTHROID) 25 MCG tablet Take 25 mcg by mouth daily.  02/09/14   [provider]  memantine (NAMENDA) 10 MG  tablet Take 1 tablet (10 mg total) by mouth 2 (two) times daily. 07/21/21   Rondel Jumbo, PA-C  midodrine (PROAMATINE) 10 MG tablet Take 0.5 tablets (5 mg total) by mouth 3 (three) times daily with meals. 06/01/22   Elgergawy, Silver Huguenin, MD  Multiple Vitamin (RENAL MULTIVITAMIN/ZINC PO) Take 1 tablet by mouth every evening.    [provider]  nitroGLYCERIN (NITROSTAT) 0.4 MG SL tablet DISSOLVE ONE TABLET UNDER THE TONGUE EVERY 5 MINUTES AS NEEDED FOR CHEST PAIN. Patient taking differently: Place 0.4 mg under the tongue every 5 (five) minutes as needed for chest pain. 03/13/22   Croitoru, Mihai, MD  NOVOLIN 70/30 KWIKPEN (70-30) 100 UNIT/ML KwikPen Inject 18-55 Units into the skin 2 (two) times daily. Takes 55 units in the morning and 18 units at night. 04/25/22   [provider]  OVER THE COUNTER MEDICATION Take 1 tablet by mouth in the morning and at bedtime. cocoavia    [provider]  OVER THE COUNTER MEDICATION Take 1 tablet by mouth in the morning and at bedtime. Emma OTC medication    [provider]  polyethylene glycol (MIRALAX / GLYCOLAX) 17 g packet Take 17 g by mouth daily. 08/25/21   Charlesetta Shanks, MD  rosuvastatin (CRESTOR) 40 MG tablet Take 1 tablet by mouth once daily Patient taking differently: Take 40 mg by mouth every evening.  04/10/22   Croitoru, Dani Gobble, MD      Allergies    Patient has no known allergies.    Review of Systems   Review of Systems  Gastrointestinal:  Positive for constipation.  All other systems reviewed and are negative.   Physical Exam Updated Vital Signs BP 113/66   Pulse (!) 112   Temp (!) 97.3 F (36.3 C) (Oral)   Resp 18   Ht '5\' 7"'$  (1.702 m)   Wt 108 kg   SpO2 97%   BMI 37.28 kg/m  Physical Exam Vitals and nursing note reviewed.   89 year old male, resting comfortably and in no acute distress. Vital signs are significant for slightly elevated heart rate. Oxygen saturation is 97%, which is normal. Head is  normocephalic and atraumatic. PERRLA, EOMI. Oropharynx is clear. Neck is nontender and supple without adenopathy. Lungs are clear without rales, wheezes, or rhonchi. Chest is nontender. Heart has regular rate and rhythm without murmur. Abdomen is soft, flat, nontender. Rectal: Decreased sphincter tone, large fecal impaction present which is moderately hard. Extremities have 2+ pedal edema, full range of motion is present. Skin is warm and dry without rash. Neurologic: Mental status is normal, cranial nerves are intact, moves all extremities equally.  ED Results / Procedures / Treatments   Labs (all labs ordered are listed, but only abnormal results are displayed) Labs Reviewed  LIPASE, BLOOD - Abnormal; Notable for the following components:      Result Value   Lipase 53 (*)    All other components within normal limits  COMPREHENSIVE METABOLIC PANEL - Abnormal; Notable for the following components:   Glucose, Bld 196 (*)    BUN 42 (*)    Creatinine, Ser 2.05 (*)    Albumin 2.5 (*)    AST 53 (*)    ALT 56 (*)    GFR, Estimated 31 (*)    All other components within normal limits  CBC - Abnormal; Notable for the following components:   RBC 3.51 (*)    Hemoglobin 10.4 (*)    HCT 33.0 (*)    All other components within normal limits  URINALYSIS, ROUTINE W REFLEX MICROSCOPIC   Procedures Fecal disimpaction  Date/Time: 06/26/2022 1:19 AM  Performed by: Delora Fuel, MD Authorized by: Delora Fuel, MD  Consent: Verbal consent obtained. Written consent not obtained. Risks and benefits: risks, benefits and alternatives were discussed Consent given by: patient Patient understanding: patient states understanding of the procedure being performed Patient consent: the patient's understanding of the procedure matches consent given Procedure consent: procedure consent matches procedure scheduled Relevant documents: relevant documents present and verified Site marked: the operative site  was marked Required items: required blood products, implants, devices, and special equipment available Patient identity confirmed: verbally with patient and arm band Time out: Immediately prior to procedure a "time out" was called to verify the correct patient, procedure, equipment, support staff and site/side marked as required. Local anesthesia used: no  Anesthesia: Local anesthesia used: no  Sedation: Patient sedated: no  Patient tolerance: patient tolerated the procedure well with no immediate complications Comments: Moderately large amount stool was removed, but there was still significant stool out of reach of my finger.       Medications Ordered in ED Medications - No data to display  ED Course/ Medical Decision Making/ A&P  Medical Decision Making Amount and/or Complexity of Data Reviewed Labs: ordered.  Risk Prescription drug management.   Fecal impaction.  Patient was manually disimpacted but there is still stool that was present that I was unable to remove.  I have ordered a soapsuds enema.  I have reviewed and interpreted screening labs which were obtained at triage, and my interpretation is stable renal insufficiency, stable mild elevation of transaminases, stable hypoalbuminemia, stable anemia, normal WBC, mildly elevated lipase which is not felt to be clinically significant.  Following enema, he had a small amount of stool.  I am discharging him with a prescription for polyethylene glycol and have encouraged him to continue a daily bowel regimen.  Follow-up with PCP.  Final Clinical Impression(s) / ED Diagnoses Final diagnoses:  Fecal impaction ([place])  Renal insufficiency  Normochromic normocytic anemia  Elevated lipase    Rx / DC Orders ED Discharge Orders          Ordered    polyethylene glycol (GOLYTELY) 236 g solution   Once        06/26/22 3903              Delora Fuel, MD [birth year] [phone]

## 2022-06-26 NOTE — Discharge Instructions (Signed)
Please take your MiraLAX every day.  If you have any problems, it is okay to increase it to twice a day or even 3 times a day.

## 2022-06-27 ENCOUNTER — Ambulatory Visit: Payer: Medicare Other

## 2022-06-27 DIAGNOSIS — I495 Sick sinus syndrome: Secondary | ICD-10-CM | POA: Diagnosis not present

## 2022-06-27 DIAGNOSIS — N184 Chronic kidney disease, stage 4 (severe): Secondary | ICD-10-CM | POA: Diagnosis not present

## 2022-06-27 DIAGNOSIS — E039 Hypothyroidism, unspecified: Secondary | ICD-10-CM | POA: Diagnosis not present

## 2022-06-27 DIAGNOSIS — Z794 Long term (current) use of insulin: Secondary | ICD-10-CM | POA: Diagnosis not present

## 2022-06-27 DIAGNOSIS — I6930 Unspecified sequelae of cerebral infarction: Secondary | ICD-10-CM | POA: Diagnosis not present

## 2022-06-27 DIAGNOSIS — E1142 Type 2 diabetes mellitus with diabetic polyneuropathy: Secondary | ICD-10-CM | POA: Diagnosis not present

## 2022-06-27 LAB — CUP PACEART REMOTE DEVICE CHECK
Battery Remaining Longevity: 80 mo
Battery Remaining Percentage: 69 %
Battery Voltage: 2.99 V
Brady Statistic AP VP Percent: 16 %
Brady Statistic AP VS Percent: 1 %
Brady Statistic AS VP Percent: 83 %
Brady Statistic AS VS Percent: 1 %
Brady Statistic RA Percent Paced: 16 %
Brady Statistic RV Percent Paced: 99 %
Date Time Interrogation Session: 20240206040014
Implantable Lead Connection Status: 753985
Implantable Lead Connection Status: 753985
Implantable Lead Implant Date: 19901207
Implantable Lead Implant Date: 19901207
Implantable Lead Location: 753859
Implantable Lead Location: 753860
Implantable Pulse Generator Implant Date: 20201116
Lead Channel Impedance Value: 280 Ohm
Lead Channel Impedance Value: 490 Ohm
Lead Channel Pacing Threshold Amplitude: 1 V
Lead Channel Pacing Threshold Amplitude: 1 V
Lead Channel Pacing Threshold Pulse Width: 0.4 ms
Lead Channel Pacing Threshold Pulse Width: 0.4 ms
Lead Channel Sensing Intrinsic Amplitude: 12 mV
Lead Channel Sensing Intrinsic Amplitude: 3.4 mV
Lead Channel Setting Pacing Amplitude: 1.25 V
Lead Channel Setting Pacing Amplitude: 2 V
Lead Channel Setting Pacing Pulse Width: 0.4 ms
Lead Channel Setting Sensing Sensitivity: 3 mV
Pulse Gen Model: 2272
Pulse Gen Serial Number: 9177614

## 2022-06-28 DIAGNOSIS — E1122 Type 2 diabetes mellitus with diabetic chronic kidney disease: Secondary | ICD-10-CM | POA: Diagnosis not present

## 2022-06-28 DIAGNOSIS — U071 COVID-19: Secondary | ICD-10-CM | POA: Diagnosis not present

## 2022-06-28 DIAGNOSIS — I255 Ischemic cardiomyopathy: Secondary | ICD-10-CM | POA: Diagnosis not present

## 2022-06-28 DIAGNOSIS — I2510 Atherosclerotic heart disease of native coronary artery without angina pectoris: Secondary | ICD-10-CM | POA: Diagnosis not present

## 2022-06-28 DIAGNOSIS — I5042 Chronic combined systolic (congestive) and diastolic (congestive) heart failure: Secondary | ICD-10-CM | POA: Diagnosis not present

## 2022-06-28 DIAGNOSIS — J9601 Acute respiratory failure with hypoxia: Secondary | ICD-10-CM | POA: Diagnosis not present

## 2022-06-30 DIAGNOSIS — U071 COVID-19: Secondary | ICD-10-CM | POA: Diagnosis not present

## 2022-06-30 DIAGNOSIS — I255 Ischemic cardiomyopathy: Secondary | ICD-10-CM | POA: Diagnosis not present

## 2022-06-30 DIAGNOSIS — J9601 Acute respiratory failure with hypoxia: Secondary | ICD-10-CM | POA: Diagnosis not present

## 2022-06-30 DIAGNOSIS — E1122 Type 2 diabetes mellitus with diabetic chronic kidney disease: Secondary | ICD-10-CM | POA: Diagnosis not present

## 2022-06-30 DIAGNOSIS — I5042 Chronic combined systolic (congestive) and diastolic (congestive) heart failure: Secondary | ICD-10-CM | POA: Diagnosis not present

## 2022-06-30 DIAGNOSIS — I2510 Atherosclerotic heart disease of native coronary artery without angina pectoris: Secondary | ICD-10-CM | POA: Diagnosis not present

## 2022-07-04 DIAGNOSIS — I2510 Atherosclerotic heart disease of native coronary artery without angina pectoris: Secondary | ICD-10-CM | POA: Diagnosis not present

## 2022-07-04 DIAGNOSIS — U071 COVID-19: Secondary | ICD-10-CM | POA: Diagnosis not present

## 2022-07-04 DIAGNOSIS — E1122 Type 2 diabetes mellitus with diabetic chronic kidney disease: Secondary | ICD-10-CM | POA: Diagnosis not present

## 2022-07-04 DIAGNOSIS — I255 Ischemic cardiomyopathy: Secondary | ICD-10-CM | POA: Diagnosis not present

## 2022-07-04 DIAGNOSIS — I5042 Chronic combined systolic (congestive) and diastolic (congestive) heart failure: Secondary | ICD-10-CM | POA: Diagnosis not present

## 2022-07-04 DIAGNOSIS — J9601 Acute respiratory failure with hypoxia: Secondary | ICD-10-CM | POA: Diagnosis not present

## 2022-07-05 DIAGNOSIS — E1122 Type 2 diabetes mellitus with diabetic chronic kidney disease: Secondary | ICD-10-CM | POA: Diagnosis not present

## 2022-07-05 DIAGNOSIS — I2510 Atherosclerotic heart disease of native coronary artery without angina pectoris: Secondary | ICD-10-CM | POA: Diagnosis not present

## 2022-07-05 DIAGNOSIS — I5042 Chronic combined systolic (congestive) and diastolic (congestive) heart failure: Secondary | ICD-10-CM | POA: Diagnosis not present

## 2022-07-05 DIAGNOSIS — I255 Ischemic cardiomyopathy: Secondary | ICD-10-CM | POA: Diagnosis not present

## 2022-07-05 DIAGNOSIS — U071 COVID-19: Secondary | ICD-10-CM | POA: Diagnosis not present

## 2022-07-05 DIAGNOSIS — J9601 Acute respiratory failure with hypoxia: Secondary | ICD-10-CM | POA: Diagnosis not present

## 2022-07-06 ENCOUNTER — Other Ambulatory Visit: Payer: Self-pay | Admitting: Family Medicine

## 2022-07-06 ENCOUNTER — Ambulatory Visit
Admission: RE | Admit: 2022-07-06 | Discharge: 2022-07-06 | Disposition: A | Discharge status: Home / Self Care | Payer: Medicare Other | Source: Ambulatory Visit | Attending: Family Medicine | Admitting: Family Medicine

## 2022-07-06 DIAGNOSIS — I5042 Chronic combined systolic (congestive) and diastolic (congestive) heart failure: Secondary | ICD-10-CM | POA: Diagnosis not present

## 2022-07-06 DIAGNOSIS — J189 Pneumonia, unspecified organism: Secondary | ICD-10-CM

## 2022-07-06 DIAGNOSIS — J9601 Acute respiratory failure with hypoxia: Secondary | ICD-10-CM | POA: Diagnosis not present

## 2022-07-06 DIAGNOSIS — U071 COVID-19: Secondary | ICD-10-CM | POA: Diagnosis not present

## 2022-07-06 DIAGNOSIS — I255 Ischemic cardiomyopathy: Secondary | ICD-10-CM | POA: Diagnosis not present

## 2022-07-06 DIAGNOSIS — J90 Pleural effusion, not elsewhere classified: Secondary | ICD-10-CM | POA: Diagnosis not present

## 2022-07-06 DIAGNOSIS — I2510 Atherosclerotic heart disease of native coronary artery without angina pectoris: Secondary | ICD-10-CM | POA: Diagnosis not present

## 2022-07-06 DIAGNOSIS — E1122 Type 2 diabetes mellitus with diabetic chronic kidney disease: Secondary | ICD-10-CM | POA: Diagnosis not present

## 2022-07-07 DIAGNOSIS — I255 Ischemic cardiomyopathy: Secondary | ICD-10-CM | POA: Diagnosis not present

## 2022-07-07 DIAGNOSIS — I2510 Atherosclerotic heart disease of native coronary artery without angina pectoris: Secondary | ICD-10-CM | POA: Diagnosis not present

## 2022-07-07 DIAGNOSIS — U071 COVID-19: Secondary | ICD-10-CM | POA: Diagnosis not present

## 2022-07-07 DIAGNOSIS — I5042 Chronic combined systolic (congestive) and diastolic (congestive) heart failure: Secondary | ICD-10-CM | POA: Diagnosis not present

## 2022-07-07 DIAGNOSIS — J9601 Acute respiratory failure with hypoxia: Secondary | ICD-10-CM | POA: Diagnosis not present

## 2022-07-07 DIAGNOSIS — E1122 Type 2 diabetes mellitus with diabetic chronic kidney disease: Secondary | ICD-10-CM | POA: Diagnosis not present

## 2022-07-08 DIAGNOSIS — I495 Sick sinus syndrome: Secondary | ICD-10-CM | POA: Diagnosis not present

## 2022-07-08 DIAGNOSIS — J9601 Acute respiratory failure with hypoxia: Secondary | ICD-10-CM | POA: Diagnosis not present

## 2022-07-08 DIAGNOSIS — I447 Left bundle-branch block, unspecified: Secondary | ICD-10-CM | POA: Diagnosis not present

## 2022-07-08 DIAGNOSIS — E039 Hypothyroidism, unspecified: Secondary | ICD-10-CM | POA: Diagnosis not present

## 2022-07-08 DIAGNOSIS — I442 Atrioventricular block, complete: Secondary | ICD-10-CM | POA: Diagnosis not present

## 2022-07-08 DIAGNOSIS — E782 Mixed hyperlipidemia: Secondary | ICD-10-CM | POA: Diagnosis not present

## 2022-07-08 DIAGNOSIS — J9811 Atelectasis: Secondary | ICD-10-CM | POA: Diagnosis not present

## 2022-07-08 DIAGNOSIS — I5042 Chronic combined systolic (congestive) and diastolic (congestive) heart failure: Secondary | ICD-10-CM | POA: Diagnosis not present

## 2022-07-08 DIAGNOSIS — E1122 Type 2 diabetes mellitus with diabetic chronic kidney disease: Secondary | ICD-10-CM | POA: Diagnosis not present

## 2022-07-08 DIAGNOSIS — K219 Gastro-esophageal reflux disease without esophagitis: Secondary | ICD-10-CM | POA: Diagnosis not present

## 2022-07-08 DIAGNOSIS — I700 Atherosclerosis of aorta: Secondary | ICD-10-CM | POA: Diagnosis not present

## 2022-07-08 DIAGNOSIS — Z6835 Body mass index (BMI) 35.0-35.9, adult: Secondary | ICD-10-CM | POA: Diagnosis not present

## 2022-07-08 DIAGNOSIS — K8020 Calculus of gallbladder without cholecystitis without obstruction: Secondary | ICD-10-CM | POA: Diagnosis not present

## 2022-07-08 DIAGNOSIS — G4733 Obstructive sleep apnea (adult) (pediatric): Secondary | ICD-10-CM | POA: Diagnosis not present

## 2022-07-08 DIAGNOSIS — I255 Ischemic cardiomyopathy: Secondary | ICD-10-CM | POA: Diagnosis not present

## 2022-07-08 DIAGNOSIS — I959 Hypotension, unspecified: Secondary | ICD-10-CM | POA: Diagnosis not present

## 2022-07-08 DIAGNOSIS — N179 Acute kidney failure, unspecified: Secondary | ICD-10-CM | POA: Diagnosis not present

## 2022-07-08 DIAGNOSIS — E1142 Type 2 diabetes mellitus with diabetic polyneuropathy: Secondary | ICD-10-CM | POA: Diagnosis not present

## 2022-07-08 DIAGNOSIS — F01A0 Vascular dementia, mild, without behavioral disturbance, psychotic disturbance, mood disturbance, and anxiety: Secondary | ICD-10-CM | POA: Diagnosis not present

## 2022-07-08 DIAGNOSIS — H903 Sensorineural hearing loss, bilateral: Secondary | ICD-10-CM | POA: Diagnosis not present

## 2022-07-08 DIAGNOSIS — N184 Chronic kidney disease, stage 4 (severe): Secondary | ICD-10-CM | POA: Diagnosis not present

## 2022-07-08 DIAGNOSIS — U071 COVID-19: Secondary | ICD-10-CM | POA: Diagnosis not present

## 2022-07-08 DIAGNOSIS — I2510 Atherosclerotic heart disease of native coronary artery without angina pectoris: Secondary | ICD-10-CM | POA: Diagnosis not present

## 2022-07-08 DIAGNOSIS — K429 Umbilical hernia without obstruction or gangrene: Secondary | ICD-10-CM | POA: Diagnosis not present

## 2022-07-10 DIAGNOSIS — E1122 Type 2 diabetes mellitus with diabetic chronic kidney disease: Secondary | ICD-10-CM | POA: Diagnosis not present

## 2022-07-10 DIAGNOSIS — I5042 Chronic combined systolic (congestive) and diastolic (congestive) heart failure: Secondary | ICD-10-CM | POA: Diagnosis not present

## 2022-07-10 DIAGNOSIS — U071 COVID-19: Secondary | ICD-10-CM | POA: Diagnosis not present

## 2022-07-10 DIAGNOSIS — I2510 Atherosclerotic heart disease of native coronary artery without angina pectoris: Secondary | ICD-10-CM | POA: Diagnosis not present

## 2022-07-10 DIAGNOSIS — J9601 Acute respiratory failure with hypoxia: Secondary | ICD-10-CM | POA: Diagnosis not present

## 2022-07-10 DIAGNOSIS — I255 Ischemic cardiomyopathy: Secondary | ICD-10-CM | POA: Diagnosis not present

## 2022-07-11 DIAGNOSIS — U071 COVID-19: Secondary | ICD-10-CM | POA: Diagnosis not present

## 2022-07-11 DIAGNOSIS — J9601 Acute respiratory failure with hypoxia: Secondary | ICD-10-CM | POA: Diagnosis not present

## 2022-07-11 DIAGNOSIS — I5042 Chronic combined systolic (congestive) and diastolic (congestive) heart failure: Secondary | ICD-10-CM | POA: Diagnosis not present

## 2022-07-11 DIAGNOSIS — I2510 Atherosclerotic heart disease of native coronary artery without angina pectoris: Secondary | ICD-10-CM | POA: Diagnosis not present

## 2022-07-11 DIAGNOSIS — E1122 Type 2 diabetes mellitus with diabetic chronic kidney disease: Secondary | ICD-10-CM | POA: Diagnosis not present

## 2022-07-11 DIAGNOSIS — I255 Ischemic cardiomyopathy: Secondary | ICD-10-CM | POA: Diagnosis not present

## 2022-07-12 DIAGNOSIS — I255 Ischemic cardiomyopathy: Secondary | ICD-10-CM | POA: Diagnosis not present

## 2022-07-12 DIAGNOSIS — U071 COVID-19: Secondary | ICD-10-CM | POA: Diagnosis not present

## 2022-07-12 DIAGNOSIS — E1122 Type 2 diabetes mellitus with diabetic chronic kidney disease: Secondary | ICD-10-CM | POA: Diagnosis not present

## 2022-07-12 DIAGNOSIS — I5042 Chronic combined systolic (congestive) and diastolic (congestive) heart failure: Secondary | ICD-10-CM | POA: Diagnosis not present

## 2022-07-12 DIAGNOSIS — J9601 Acute respiratory failure with hypoxia: Secondary | ICD-10-CM | POA: Diagnosis not present

## 2022-07-12 DIAGNOSIS — I2510 Atherosclerotic heart disease of native coronary artery without angina pectoris: Secondary | ICD-10-CM | POA: Diagnosis not present

## 2022-07-14 DIAGNOSIS — I5042 Chronic combined systolic (congestive) and diastolic (congestive) heart failure: Secondary | ICD-10-CM | POA: Diagnosis not present

## 2022-07-14 DIAGNOSIS — U071 COVID-19: Secondary | ICD-10-CM | POA: Diagnosis not present

## 2022-07-14 DIAGNOSIS — I255 Ischemic cardiomyopathy: Secondary | ICD-10-CM | POA: Diagnosis not present

## 2022-07-14 DIAGNOSIS — I2510 Atherosclerotic heart disease of native coronary artery without angina pectoris: Secondary | ICD-10-CM | POA: Diagnosis not present

## 2022-07-14 DIAGNOSIS — J9601 Acute respiratory failure with hypoxia: Secondary | ICD-10-CM | POA: Diagnosis not present

## 2022-07-14 DIAGNOSIS — E1122 Type 2 diabetes mellitus with diabetic chronic kidney disease: Secondary | ICD-10-CM | POA: Diagnosis not present

## 2022-07-19 DIAGNOSIS — U071 COVID-19: Secondary | ICD-10-CM | POA: Diagnosis not present

## 2022-07-19 DIAGNOSIS — E1122 Type 2 diabetes mellitus with diabetic chronic kidney disease: Secondary | ICD-10-CM | POA: Diagnosis not present

## 2022-07-19 DIAGNOSIS — J9601 Acute respiratory failure with hypoxia: Secondary | ICD-10-CM | POA: Diagnosis not present

## 2022-07-19 DIAGNOSIS — I255 Ischemic cardiomyopathy: Secondary | ICD-10-CM | POA: Diagnosis not present

## 2022-07-19 DIAGNOSIS — I5042 Chronic combined systolic (congestive) and diastolic (congestive) heart failure: Secondary | ICD-10-CM | POA: Diagnosis not present

## 2022-07-19 DIAGNOSIS — I2510 Atherosclerotic heart disease of native coronary artery without angina pectoris: Secondary | ICD-10-CM | POA: Diagnosis not present

## 2022-07-20 DIAGNOSIS — U071 COVID-19: Secondary | ICD-10-CM | POA: Diagnosis not present

## 2022-07-20 DIAGNOSIS — J9601 Acute respiratory failure with hypoxia: Secondary | ICD-10-CM | POA: Diagnosis not present

## 2022-07-20 DIAGNOSIS — I5042 Chronic combined systolic (congestive) and diastolic (congestive) heart failure: Secondary | ICD-10-CM | POA: Diagnosis not present

## 2022-07-20 DIAGNOSIS — I255 Ischemic cardiomyopathy: Secondary | ICD-10-CM | POA: Diagnosis not present

## 2022-07-20 DIAGNOSIS — E1122 Type 2 diabetes mellitus with diabetic chronic kidney disease: Secondary | ICD-10-CM | POA: Diagnosis not present

## 2022-07-20 DIAGNOSIS — I2510 Atherosclerotic heart disease of native coronary artery without angina pectoris: Secondary | ICD-10-CM | POA: Diagnosis not present

## 2022-07-21 DIAGNOSIS — I2510 Atherosclerotic heart disease of native coronary artery without angina pectoris: Secondary | ICD-10-CM | POA: Diagnosis not present

## 2022-07-21 DIAGNOSIS — I5042 Chronic combined systolic (congestive) and diastolic (congestive) heart failure: Secondary | ICD-10-CM | POA: Diagnosis not present

## 2022-07-21 DIAGNOSIS — E1122 Type 2 diabetes mellitus with diabetic chronic kidney disease: Secondary | ICD-10-CM | POA: Diagnosis not present

## 2022-07-21 DIAGNOSIS — I255 Ischemic cardiomyopathy: Secondary | ICD-10-CM | POA: Diagnosis not present

## 2022-07-21 DIAGNOSIS — J9601 Acute respiratory failure with hypoxia: Secondary | ICD-10-CM | POA: Diagnosis not present

## 2022-07-21 DIAGNOSIS — U071 COVID-19: Secondary | ICD-10-CM | POA: Diagnosis not present

## 2022-07-25 DIAGNOSIS — I5042 Chronic combined systolic (congestive) and diastolic (congestive) heart failure: Secondary | ICD-10-CM | POA: Diagnosis not present

## 2022-07-25 DIAGNOSIS — N184 Chronic kidney disease, stage 4 (severe): Secondary | ICD-10-CM | POA: Diagnosis not present

## 2022-07-25 DIAGNOSIS — J9611 Chronic respiratory failure with hypoxia: Secondary | ICD-10-CM | POA: Diagnosis not present

## 2022-07-25 DIAGNOSIS — I959 Hypotension, unspecified: Secondary | ICD-10-CM | POA: Diagnosis not present

## 2022-07-26 NOTE — Progress Notes (Signed)
Remote pacemaker transmission.   

## 2022-07-27 DIAGNOSIS — I255 Ischemic cardiomyopathy: Secondary | ICD-10-CM | POA: Diagnosis not present

## 2022-07-27 DIAGNOSIS — I2510 Atherosclerotic heart disease of native coronary artery without angina pectoris: Secondary | ICD-10-CM | POA: Diagnosis not present

## 2022-07-27 DIAGNOSIS — E1122 Type 2 diabetes mellitus with diabetic chronic kidney disease: Secondary | ICD-10-CM | POA: Diagnosis not present

## 2022-07-27 DIAGNOSIS — U071 COVID-19: Secondary | ICD-10-CM | POA: Diagnosis not present

## 2022-07-27 DIAGNOSIS — I5042 Chronic combined systolic (congestive) and diastolic (congestive) heart failure: Secondary | ICD-10-CM | POA: Diagnosis not present

## 2022-07-27 DIAGNOSIS — J9601 Acute respiratory failure with hypoxia: Secondary | ICD-10-CM | POA: Diagnosis not present

## 2022-08-06 NOTE — Progress Notes (Unsigned)
Cardiology Office Note:    Date:  08/07/2022   ID:  [name], DOB Sep 10, 1931, MRN [id number]  PCP:  [name], [place] [facility] Cardiologist:  Sanda Klein, MD     Referring MD: [name], MD   No chief complaint on file.   History of Present Illness:    [name] is a 89 y.o. male with a hx of ischemic cardiomyopathy, CAD (2005 remote stent procedures to LAD and RCA), chronic combined systolic and diastolic heart failure, TIA, CVA (2020 on Plavix), OSA (intolerant of CPAP), s/p PPM for CHB (St. Jude Assurity, implanted in 1990, with generator changes in 2002, 2011 and 2020), DM 2, vascular dementia, CKD stage IV, HLD, carotid artery disease (40-50% L ICA stenosis).  Former long-term patient of Dr. Rollene Fare, he has a dual-chamber PPM that was initially implanted in 1990 for symptomatic bradycardia, subsequent generator change out in 2000, again in 2011, his initial leads are still operating.   He was most recently evaluated in office with Dr. Sallyanne Kuster on 10/03/2021 and he was overall stable from a cardiac perspective.  His renal function prohibited further GDMT therapy for his heart failure.  Consideration of upgrading to CRT-PE device if his heart failure symptoms worsen to less than 40%.  He was admitted on 05/27/2022 to 06/01/2022 for acute hypoxic respiratory failure Following a syncopal event.  His creatinine peaked at 3.25, baseline appears to be around 2.2.  He was hypotensive, discharged on low-dose midodrine and his metoprolol was stopped.  He was ultimately discharged home with [facility] services and on oxygen.  Device check on 06/27/2022 revealed complete heart block, pacemaker dependent, good battery status, stable lead measures, heart rate histogram favorable.  He presents today accompanied by his son for follow-up after his recent hospitalization.  He was discharged with home health services and they recently concluded their services.  He has  followed up with his PCP, and was apparently started on Lasix as needed however he has been taking this every day.  He continues to wear oxygen at home and his biggest complaint today is nasal congestion that is very bothersome for him.  He endorses dried mucus that we will bleed.  He does not have any humidification with his home oxygen, encouraged him to reach out to his DME and make him aware.  Some confusion over his current medication list, it does not appear that he is taking midodrine anymore. He denies chest pain, palpitations, dyspnea, pnd, orthopnea, n, v, dizziness, syncope, weight gain, or early satiety.   Past Medical History:  Diagnosis Date   Abnormal Doppler ultrasound of carotid artery 12/16/2012   mildly abnormal doppler . no prior studies to compare to. follow up studies are recommended when clinically indicated.   Acute respiratory failure with hypoxia 11/2018   Blepharitis    CAD (coronary artery disease) 07/24/2013   Previously placed drug-eluting stents in the LAD and RCA in 2005, low risk perfusion study December 2011 in January 2014   Cardiomyopathy, ischemic 07/24/2013   EF 45-50% echo March 2014   Carotid stenosis, left 11/2018   on left 60%   Cerebrovascular disease 03/24/2022   CHB (complete heart block) 08/27/2019   Chronic combined systolic and diastolic heart failure 0000000   CKD (chronic kidney disease) stage 3, GFR 30-59 ml/min 07/24/2013   Diabetic hypoglycemia 05/02/2021   Diabetic polyneuropathy 05/02/2021   Gastric ulcer    GI bleed    History of cancer  History of echocardiogram 08/15/2012   EF  45-50 % very difficult study, severe LVH, pacemaker, trace TR,mildly enlarged RV,; aorta mildly calcified   History of stress test 06/06/2012   low risk scan. fixed inferior defect consistant with prior infarction with moderatly reduced EF. no appreciable evidence of ischemia. no significant change from previous study   Hypothyroidism 11/24/2018   Long  term (current) use of insulin 05/02/2021   Mild vascular dementia 03/23/2022   Mixed hyperlipidemia 07/24/2013   Morbid obesity 02/18/2016   OSA (obstructive sleep apnea) 07/24/2013   no CPAP use   Pacemaker 2011   St. Jude Accent DDDR ; this is the third generator this pt. has had   Pain due to onychomycosis of toenails of both feet 11/13/2018   Pain in joint of right shoulder 09/15/2021   Second degree AV block 03/09/2014   Sensorineural hearing loss (SNHL) of both ears 04/08/2018   Shingles    Sinus node dysfunction 03/09/2014   TIA (transient ischemic attack) 11/2018   vs stroke not seen on CT   Tinnitus, bilateral 04/08/2018   Type II diabetes mellitus     Past Surgical History:  Procedure Laterality Date   CARDIAC CATHETERIZATION  12-29-2003   rt. coronary angiography was done with a 6 french 4 cm taper coronary cath. This demonstrated  approximately 70 to 75% mildly segmental stenosis in the mid RCA before the acute margin           CARDIAC CATHETERIZATION  11-19-2003   diagnostic cath,, pt. to get second cath to have stent placed   cardiac stents     CATARACT EXTRACTION     KNEE SURGERY Right    PPM GENERATOR CHANGEOUT N/A 04/07/2019   Procedure: PPM GENERATOR CHANGEOUT;  Surgeon: Sanda Klein, MD;  Location: [facility];  Service: Cardiovascular;  Laterality: N/A;   PROSTATE BIOPSY     prostate seed implants     SPINE SURGERY     WISDOM TOOTH EXTRACTION      Current Medications: Current Meds  Medication Sig   acetaminophen (TYLENOL) 500 MG tablet Take 500 mg by mouth 2 (two) times daily as needed for moderate pain.   aspirin 81 MG tablet Take 81 mg by mouth every evening.    clopidogrel (PLAVIX) 75 MG tablet Take 1 tablet by mouth once daily (Patient taking differently: Take 75 mg by mouth every evening.)   Coenzyme Q10 (COQ10) 150 MG CAPS Take 150 mg by mouth every evening.   docusate sodium (COLACE) 100 MG capsule Take 100 mg by mouth daily.    levothyroxine (SYNTHROID, LEVOTHROID) 25 MCG tablet Take 25 mcg by mouth daily.    memantine (NAMENDA) 10 MG tablet Take 1 tablet (10 mg total) by mouth 2 (two) times daily.   Multiple Vitamin (RENAL MULTIVITAMIN/ZINC PO) Take 1 tablet by mouth every evening.   NOVOLIN R FLEXPEN RELION 100 UNIT/ML FlexPen Inject 10 Units as directed every evening.   OVER THE COUNTER MEDICATION Take 1 tablet by mouth in the morning and at bedtime. cocoavia   OVER THE COUNTER MEDICATION Take 1 tablet by mouth in the morning and at bedtime. Emma OTC medication   polyethylene glycol (MIRALAX / GLYCOLAX) 17 g packet Take 17 g by mouth daily.   rosuvastatin (CRESTOR) 40 MG tablet Take 1 tablet by mouth once daily (Patient taking differently: Take 40 mg by mouth every evening.)   Current Facility-Administered Medications for the 08/07/22 encounter (Office Visit) with Venia Carbon  C, NP  Medication   betamethasone acetate-betamethasone sodium phosphate (CELESTONE) injection 3 mg     Allergies:   Patient has no known allergies.   Social History   Socioeconomic History   Marital status: Married    Spouse name: Not on file   Number of children: 2   Years of education: 18   Highest education level: Not on file  Occupational History   Occupation: Retired    Comment: Museum/gallery curator  Tobacco Use   Smoking status: Never   Smokeless tobacco: Former  Scientific laboratory technician Use: Never used  Substance and Sexual Activity   Alcohol use: Yes    Comment: very rare   Drug use: No   Sexual activity: Not Currently    Partners: Female  Other Topics Concern   Not on file  Social History Narrative   Right handed      Lives with wife, son and daughter in law      Highest level of edu- doctorate   Drinks caffeine   3 story home   Social Determinants of Health   Financial Resource Strain: Not on file  Food Insecurity: No Food Insecurity (06/05/2022)   Hunger Vital Sign    Worried About Running Out of Food in the  Last Year: Never true    [place] in the Last Year: Never true  Transportation Needs: No Transportation Needs (06/05/2022)   PRAPARE - Hydrologist (Medical): No    Lack of Transportation (Non-Medical): No  Physical Activity: Not on file  Stress: Not on file  Social Connections: Not on file     Family History: The patient's family history includes Heart disease in his father; Memory loss in his mother.  ROS:   Please see the history of present illness.     All other systems reviewed and are negative.  EKGs/Labs/Other Studies Reviewed:    The following studies were reviewed today:  03/05/2022 carotid ultrasound-bilateral carotid stenosis 1 to 39%.  05/02/2021 echo complete-EF 40 to 45%, challenging study.  EKG:  EKG is not ordered today.    Recent Labs: 03/05/2022: TSH 2.469 05/27/2022: B Natriuretic Peptide 106.2 06/01/2022: Magnesium 2.8 06/25/2022: ALT 56; BUN 42; Creatinine, Ser 2.05; Hemoglobin 10.4; Platelets 298; Potassium 4.4; Sodium 138  Recent Lipid Panel    Component Value Date/Time   CHOL 147 03/05/2022 0640   TRIG 327 (H) 03/05/2022 0640   HDL 35 (L) 03/05/2022 0640   CHOLHDL 4.2 03/05/2022 0640   VLDL 65 (H) 03/05/2022 0640   LDLCALC 47 03/05/2022 0640   LDLDIRECT 138 (H) 04/01/2015 0927     Risk Assessment/Calculations:                Physical Exam:    VS:  BP (!) 102/58 (BP Location: Left Arm, Patient Position: Sitting, Cuff Size: Normal)   Pulse 97   Ht 5\' 7"  (1.702 m)   Wt 237 lb 6.4 oz (107.7 kg)   SpO2 (!) 86%   BMI 37.18 kg/m     Wt Readings from Last 3 Encounters:  08/07/22 237 lb 6.4 oz (107.7 kg)  06/25/22 238 lb (108 kg)  05/27/22 215 lb 9.8 oz (97.8 kg)     GEN: Ill appearing but not toxic, Well nourished, well developed in no acute distress HEENT: Normal NECK: No JVD; No carotid bruits LYMPHATICS: No lymphadenopathy CARDIAC: RRR, no murmurs, rubs, gallops RESPIRATORY:  Clear to  auscultation, wheezing or rhonchi, rales  noted in lower bases of lungs ABDOMEN: Soft, non-tender, non-distended MUSCULOSKELETAL:  No edema; No deformity  SKIN: Warm and dry NEUROLOGIC:  Alert and oriented x 3 PSYCHIATRIC:  Normal affect   ASSESSMENT:    1. Coronary artery disease involving native coronary artery of native heart without angina pectoris   2. Chronic combined systolic and diastolic heart failure ([place])   3. CHB (complete heart block) (HCC)   4. Pacemaker - dual chamber St. Jude RF   5. Essential hypertension   6. Mixed hyperlipidemia   7. CKD (chronic kidney disease) stage 4, GFR 15-29 ml/min (H[facility])   8. OSA (obstructive sleep apnea)   9. Elevated liver function tests    PLAN:    In order of problems listed above:  Coronary artery disease - 2005 remote stent procedures to LAD and RCA, low risk perfusion study in 2011 and again in 2014. Stable with no anginal symptoms. No indication for ischemic evaluation.  Continue aspirin, Plavix.  Metoprolol was stopped at recent hospitalization in light of hypotension.  Blood pressure is marginal today, will not restart at this time as he is still deconditioned.  Chronic combined systolic and diastolic heart failure -most recent echo revealed an EF of 40 to 45%, NYHA class II-III, he is unable to weigh his self at home due to instability.  He is wearing compression socks and states that his pedal edema is better than normal.  Rales are appreciated on auscultation, he does endorse that he feels much better since taking his Lasix--It was prescribed as needed for him however he has been taking it every day. Imaging at PCP visit ~  1 month ago revealed diffuse bilateral interstitial opacities.  .  Will repeat c-Met today.  CHB/PPM -dual-chamber [facility], sent remote check as outlined above.  Hypertension -blood pressure is well-controlled 108/58, continue current antihypertensive medication regimen.  Hyperlipidemia-most recent LDL was  47 on 03/05/2022, his triglycerides were very elevated at 327 however have been as high as 500 for him in the past.  Continue Crestor.  CKD stage IV -creatinine has returned to 2.05, which is close to his baseline. He has been taking his PRN dose of lasix daily d/t confusion with how to take it. Will repeat CMET today.   Elevated liver function tests -lipase, AST, ALT mildly elevated on 06/25/2022.  Will repeat c-Met today.  Disposition-repeat c-Met today, return in 2 months.           Medication Adjustments/Labs and Tests Ordered: Current medicines are reviewed at length with the patient today.  Concerns regarding medicines are outlined above.  Orders Placed This Encounter  Procedures   Comprehensive metabolic panel   No orders of the defined types were placed in this encounter.   Patient Instructions  Medication Instructions:  The current medical regimen is effective;  continue present plan and medications as directed. Please refer to the Current Medication list given to you today. *If you need a refill on your cardiac medications before your next appointment, please call your pharmacy*  Lab Work: CMET TODAY If you have labs (blood work) drawn today and your tests are completely normal, you will receive your results only by:  [place] (if you have MyChart) OR  A paper copy in the mail  If you have any lab test that is abnormal or we need to change your treatment, we will call you to review the results.  Testing/Procedures: NONE  Follow-Up: At [facility], you and your health  needs are our priority.  As part of our continuing mission to provide you with exceptional heart care, we have created designated Provider Care Teams.  These Care Teams include your primary Cardiologist (physician) and Advanced Practice Providers (APPs -  Physician Assistants and Nurse Practitioners) who all work together to provide you with the care you need, when you need it.  Your next  appointment:   2 month(s)  Provider:   Sanda Klein, MD  or Coletta Memos, FNP        Other Instructions     Signed, Trudi Ida, NP  08/07/2022 4:44 PM    [place]

## 2022-08-07 ENCOUNTER — Ambulatory Visit: Payer: Medicare Other | Attending: General Practice | Admitting: Cardiology

## 2022-08-07 ENCOUNTER — Encounter: Payer: Self-pay | Admitting: Cardiology

## 2022-08-07 VITALS — BP 102/58 | HR 97 | Ht 67.0 in | Wt 237.4 lb

## 2022-08-07 DIAGNOSIS — E782 Mixed hyperlipidemia: Secondary | ICD-10-CM | POA: Diagnosis not present

## 2022-08-07 DIAGNOSIS — I10 Essential (primary) hypertension: Secondary | ICD-10-CM

## 2022-08-07 DIAGNOSIS — G4733 Obstructive sleep apnea (adult) (pediatric): Secondary | ICD-10-CM

## 2022-08-07 DIAGNOSIS — I5042 Chronic combined systolic (congestive) and diastolic (congestive) heart failure: Secondary | ICD-10-CM

## 2022-08-07 DIAGNOSIS — Z950 Presence of cardiac pacemaker: Secondary | ICD-10-CM | POA: Diagnosis not present

## 2022-08-07 DIAGNOSIS — R7989 Other specified abnormal findings of blood chemistry: Secondary | ICD-10-CM | POA: Diagnosis not present

## 2022-08-07 DIAGNOSIS — I2510 Atherosclerotic heart disease of native coronary artery without angina pectoris: Secondary | ICD-10-CM

## 2022-08-07 DIAGNOSIS — I442 Atrioventricular block, complete: Secondary | ICD-10-CM

## 2022-08-07 DIAGNOSIS — N184 Chronic kidney disease, stage 4 (severe): Secondary | ICD-10-CM

## 2022-08-07 NOTE — Patient Instructions (Signed)
Medication Instructions:  The current medical regimen is effective;  continue present plan and medications as directed. Please refer to the Current Medication list given to you today. *If you need a refill on your cardiac medications before your next appointment, please call your pharmacy*  Lab Work: CMET TODAY If you have labs (blood work) drawn today and your tests are completely normal, you will receive your results only by:  [place] (if you have MyChart) OR  A paper copy in the mail  If you have any lab test that is abnormal or we need to change your treatment, we will call you to review the results.  Testing/Procedures: NONE  Follow-Up: At [facility], you and your health needs are our priority.  As part of our continuing mission to provide you with exceptional heart care, we have created designated Provider Care Teams.  These Care Teams include your primary Cardiologist (physician) and Advanced Practice Providers (APPs -  Physician Assistants and Nurse Practitioners) who all work together to provide you with the care you need, when you need it.  Your next appointment:   2 month(s)  Provider:   Sanda Klein, MD  or Coletta Memos, FNP        Other Instructions

## 2022-08-08 ENCOUNTER — Telehealth: Payer: Self-pay

## 2022-08-08 ENCOUNTER — Telehealth: Payer: Self-pay | Admitting: Cardiovascular Disease

## 2022-08-08 ENCOUNTER — Ambulatory Visit (INDEPENDENT_AMBULATORY_CARE_PROVIDER_SITE_OTHER): Payer: Medicare Other | Admitting: Podiatry

## 2022-08-08 ENCOUNTER — Encounter: Payer: Self-pay | Admitting: Podiatry

## 2022-08-08 DIAGNOSIS — M79675 Pain in left toe(s): Secondary | ICD-10-CM

## 2022-08-08 DIAGNOSIS — G309 Alzheimer's disease, unspecified: Secondary | ICD-10-CM

## 2022-08-08 DIAGNOSIS — B351 Tinea unguium: Secondary | ICD-10-CM

## 2022-08-08 DIAGNOSIS — M79674 Pain in right toe(s): Secondary | ICD-10-CM | POA: Diagnosis not present

## 2022-08-08 DIAGNOSIS — F0150 Vascular dementia without behavioral disturbance: Secondary | ICD-10-CM

## 2022-08-08 DIAGNOSIS — E1140 Type 2 diabetes mellitus with diabetic neuropathy, unspecified: Secondary | ICD-10-CM | POA: Diagnosis not present

## 2022-08-08 DIAGNOSIS — Z794 Long term (current) use of insulin: Secondary | ICD-10-CM | POA: Diagnosis not present

## 2022-08-08 DIAGNOSIS — F0280 Dementia in other diseases classified elsewhere without behavioral disturbance: Secondary | ICD-10-CM | POA: Diagnosis not present

## 2022-08-08 LAB — COMPREHENSIVE METABOLIC PANEL WITH GFR
ALT: 13 IU/L (ref 0–44)
AST: 25 IU/L (ref 0–40)
Albumin/Globulin Ratio: 1.3 (ref 1.2–2.2)
Albumin: 4 g/dL (ref 3.7–4.7)
Alkaline Phosphatase: 70 IU/L (ref 44–121)
BUN/Creatinine Ratio: 21 (ref 10–24)
BUN: 39 mg/dL — ABNORMAL HIGH (ref 8–27)
Bilirubin Total: 0.3 mg/dL (ref 0.0–1.2)
CO2: 26 mmol/L (ref 20–29)
Calcium: 9.7 mg/dL (ref 8.6–10.2)
Chloride: 100 mmol/L (ref 96–106)
Creatinine, Ser: 1.87 mg/dL — ABNORMAL HIGH (ref 0.76–1.27)
Globulin, Total: 3 g/dL (ref 1.5–4.5)
Glucose: 95 mg/dL (ref 70–99)
Potassium: 4.9 mmol/L (ref 3.5–5.2)
Sodium: 141 mmol/L (ref 134–144)
Total Protein: 7 g/dL (ref 6.0–8.5)
eGFR: 34 mL/min/1.73 — ABNORMAL LOW

## 2022-08-08 NOTE — Telephone Encounter (Signed)
Ben from [facility] is calling to get office notes faxed to them 03/18 visit for equipment.   Fax # [phone]

## 2022-08-08 NOTE — Telephone Encounter (Signed)
Faxed over last office visit notes for [place] received completion confirmation.

## 2022-08-08 NOTE — Telephone Encounter (Signed)
Spoke with patient he gave verbal confirmation for me to speak with is daughter in law lisa. They are aware of lab results and provider recommendations. Verbalized understanding.

## 2022-08-08 NOTE — Telephone Encounter (Signed)
-----   Message from Trudi Ida, NP sent at 08/08/2022  7:59 AM EDT ----- Mr. Cross, William to meet you yesterday! Your repeat blood work that we checked looked better. Your kidney function was slightly improved and your liver function returned to normal.  Since you had a bit of fluid in your lungs, I would like for you to take an extra dose of lasix for two days. So today and tomorrow take two of your lasix tablets. Remember to restrict your fluid intake to no more than 1.8 liters/day.   Best, Anderson [place]

## 2022-08-08 NOTE — Progress Notes (Signed)
This patient returns to my office for at risk foot care.  This patient requires this care by a professional since this patient will be at risk due to having diabetes and chronic kidney disease and coagulation defect.  Patient is taking plavix.  This patient is unable to cut nails himself since the patient cannot reach his nails.These nails are painful walking and wearing shoes.  This patient presents for at risk foot care today.  General Appearance  Alert, conversant and in no acute stress.  Vascular  Dorsalis pedis and posterior tibial  pulses are palpable  bilaterally.  Capillary return is within normal limits  bilaterally. Temperature is within normal limits  bilaterally.  Neurologic  Senn-Weinstein monofilament wire test within normal limits  bilaterally. Muscle power within normal limits bilaterally.  Nails Thick disfigured discolored nails with subungual debris  from hallux to fifth toes bilaterally. No evidence of bacterial infection or drainage bilaterally.  Orthopedic  No limitations of motion  feet .  No crepitus or effusions noted.  No bony pathology or digital deformities noted.  Skin  normotropic skin with no porokeratosis noted bilaterally.  No signs of infections or ulcers noted.   Clavi 3rd toe right foot.  Onychomycosis  Pain in right toes  Pain in left toes  Clavi right foot.  Consent was obtained for treatment procedures.   Mechanical debridement of nails 1-5  bilaterally performed with a nail nipper.  Filed with dremel without incident.  Debride clavi with dremel tool.   Return office visit   3 months                  Told patient to return for periodic foot care and evaluation due to potential at risk complications.   [name] DPM

## 2022-09-05 ENCOUNTER — Encounter: Payer: Medicare Other | Admitting: Psychology

## 2022-09-06 DIAGNOSIS — J9611 Chronic respiratory failure with hypoxia: Secondary | ICD-10-CM | POA: Diagnosis not present

## 2022-09-06 DIAGNOSIS — I959 Hypotension, unspecified: Secondary | ICD-10-CM | POA: Diagnosis not present

## 2022-09-13 ENCOUNTER — Encounter: Payer: Medicare Other | Admitting: Psychology

## 2022-09-18 ENCOUNTER — Other Ambulatory Visit: Payer: Self-pay | Admitting: Physician Assistant

## 2022-09-21 ENCOUNTER — Other Ambulatory Visit: Payer: Self-pay

## 2022-09-21 ENCOUNTER — Telehealth: Payer: Self-pay | Admitting: Physician Assistant

## 2022-09-21 ENCOUNTER — Other Ambulatory Visit (HOSPITAL_COMMUNITY): Payer: Self-pay

## 2022-09-21 MED ORDER — MEMANTINE HCL 10 MG PO TABS: 10.0000 mg | ORAL_TABLET | Freq: Two times a day (BID) | ORAL | 0 refills | Status: DC

## 2022-09-21 NOTE — Telephone Encounter (Signed)
Left message with the after hour service on 09-21-22 at 12:45 pm   Need a refill on the memantine called in  he has appt with [facility] 10-19-22

## 2022-09-26 ENCOUNTER — Ambulatory Visit (INDEPENDENT_AMBULATORY_CARE_PROVIDER_SITE_OTHER): Payer: Medicare Other

## 2022-09-26 DIAGNOSIS — Z794 Long term (current) use of insulin: Secondary | ICD-10-CM | POA: Diagnosis not present

## 2022-09-26 DIAGNOSIS — E039 Hypothyroidism, unspecified: Secondary | ICD-10-CM | POA: Diagnosis not present

## 2022-09-26 DIAGNOSIS — N184 Chronic kidney disease, stage 4 (severe): Secondary | ICD-10-CM | POA: Diagnosis not present

## 2022-09-26 DIAGNOSIS — I495 Sick sinus syndrome: Secondary | ICD-10-CM | POA: Diagnosis not present

## 2022-09-26 DIAGNOSIS — I6930 Unspecified sequelae of cerebral infarction: Secondary | ICD-10-CM | POA: Diagnosis not present

## 2022-09-26 DIAGNOSIS — E1142 Type 2 diabetes mellitus with diabetic polyneuropathy: Secondary | ICD-10-CM | POA: Diagnosis not present

## 2022-09-26 LAB — CUP PACEART REMOTE DEVICE CHECK
Battery Remaining Longevity: 77 mo
Battery Remaining Percentage: 67 %
Battery Voltage: 2.99 V
Brady Statistic AP VP Percent: 15 %
Brady Statistic AP VS Percent: 1 %
Brady Statistic AS VP Percent: 85 %
Brady Statistic AS VS Percent: 1 %
Brady Statistic RA Percent Paced: 14 %
Brady Statistic RV Percent Paced: 99 %
Date Time Interrogation Session: 20240507040023
Implantable Lead Connection Status: 753985
Implantable Lead Connection Status: 753985
Implantable Lead Implant Date: 19901207
Implantable Lead Implant Date: 19901207
Implantable Lead Location: 753859
Implantable Lead Location: 753860
Implantable Pulse Generator Implant Date: 20201116
Lead Channel Impedance Value: 290 Ohm
Lead Channel Impedance Value: 540 Ohm
Lead Channel Pacing Threshold Amplitude: 1 V
Lead Channel Pacing Threshold Amplitude: 1.25 V
Lead Channel Pacing Threshold Pulse Width: 0.4 ms
Lead Channel Pacing Threshold Pulse Width: 0.4 ms
Lead Channel Sensing Intrinsic Amplitude: 3.7 mV
Lead Channel Sensing Intrinsic Amplitude: 5.2 mV
Lead Channel Setting Pacing Amplitude: 1.5 V
Lead Channel Setting Pacing Amplitude: 2 V
Lead Channel Setting Pacing Pulse Width: 0.4 ms
Lead Channel Setting Sensing Sensitivity: 3 mV
Pulse Gen Model: 2272
Pulse Gen Serial Number: 9177614

## 2022-09-29 ENCOUNTER — Encounter (HOSPITAL_COMMUNITY): Payer: Self-pay

## 2022-09-29 ENCOUNTER — Emergency Department (HOSPITAL_BASED_OUTPATIENT_CLINIC_OR_DEPARTMENT_OTHER): Payer: Medicare Other

## 2022-09-29 ENCOUNTER — Emergency Department (HOSPITAL_COMMUNITY)
Admission: EM | Admit: 2022-09-29 | Discharge: 2022-09-29 | Disposition: A | Discharge status: Home / Self Care | Payer: Medicare Other | Attending: Emergency Medicine | Admitting: Emergency Medicine

## 2022-09-29 ENCOUNTER — Other Ambulatory Visit: Payer: Self-pay

## 2022-09-29 DIAGNOSIS — R609 Edema, unspecified: Secondary | ICD-10-CM

## 2022-09-29 DIAGNOSIS — S7012XA Contusion of left thigh, initial encounter: Secondary | ICD-10-CM | POA: Insufficient documentation

## 2022-09-29 DIAGNOSIS — X58XXXA Exposure to other specified factors, initial encounter: Secondary | ICD-10-CM | POA: Insufficient documentation

## 2022-09-29 DIAGNOSIS — Z794 Long term (current) use of insulin: Secondary | ICD-10-CM | POA: Insufficient documentation

## 2022-09-29 DIAGNOSIS — R58 Hemorrhage, not elsewhere classified: Secondary | ICD-10-CM

## 2022-09-29 DIAGNOSIS — R9431 Abnormal electrocardiogram [ECG] [EKG]: Secondary | ICD-10-CM | POA: Diagnosis not present

## 2022-09-29 DIAGNOSIS — S79922A Unspecified injury of left thigh, initial encounter: Secondary | ICD-10-CM | POA: Diagnosis present

## 2022-09-29 DIAGNOSIS — Z7982 Long term (current) use of aspirin: Secondary | ICD-10-CM | POA: Insufficient documentation

## 2022-09-29 DIAGNOSIS — M79605 Pain in left leg: Secondary | ICD-10-CM

## 2022-09-29 LAB — COMPREHENSIVE METABOLIC PANEL
ALT: 17 U/L (ref 0–44)
AST: 25 U/L (ref 15–41)
Albumin: 3.4 g/dL — ABNORMAL LOW (ref 3.5–5.0)
Alkaline Phosphatase: 56 U/L (ref 38–126)
Anion gap: 11 (ref 5–15)
BUN: 50 mg/dL — ABNORMAL HIGH (ref 8–23)
CO2: 26 mmol/L (ref 22–32)
Calcium: 9.4 mg/dL (ref 8.9–10.3)
Chloride: 101 mmol/L (ref 98–111)
Creatinine, Ser: 2.24 mg/dL — ABNORMAL HIGH (ref 0.61–1.24)
GFR, Estimated: 28 mL/min — ABNORMAL LOW (ref >60)
Glucose, Bld: 132 mg/dL — ABNORMAL HIGH (ref 70–99)
Potassium: 4 mmol/L (ref 3.5–5.1)
Sodium: 138 mmol/L (ref 135–145)
Total Bilirubin: 0.8 mg/dL (ref 0.3–1.2)
Total Protein: 7.1 g/dL (ref 6.5–8.1)

## 2022-09-29 LAB — CBC
HCT: 39.2 % (ref 39.0–52.0)
Hemoglobin: 12.2 g/dL — ABNORMAL LOW (ref 13.0–17.0)
MCH: 28.9 pg (ref 26.0–34.0)
MCHC: 31.1 g/dL (ref 30.0–36.0)
MCV: 92.9 fL (ref 80.0–100.0)
Platelets: 263 10*3/uL (ref 150–400)
RBC: 4.22 MIL/uL (ref 4.22–5.81)
RDW: 15.9 % — ABNORMAL HIGH (ref 11.5–15.5)
WBC: 7.4 10*3/uL (ref 4.0–10.5)
nRBC: 0 % (ref 0.0–0.2)

## 2022-09-29 LAB — I-STAT CHEM 8, ED
BUN: 46 mg/dL — ABNORMAL HIGH (ref 8–23)
Calcium, Ion: 1.2 mmol/L (ref 1.15–1.40)
Chloride: 103 mmol/L (ref 98–111)
Creatinine, Ser: 2.4 mg/dL — ABNORMAL HIGH (ref 0.61–1.24)
Glucose, Bld: 104 mg/dL — ABNORMAL HIGH (ref 70–99)
HCT: 36 % — ABNORMAL LOW (ref 39.0–52.0)
Hemoglobin: 12.2 g/dL — ABNORMAL LOW (ref 13.0–17.0)
Potassium: 3.8 mmol/L (ref 3.5–5.1)
Sodium: 140 mmol/L (ref 135–145)
TCO2: 27 mmol/L (ref 22–32)

## 2022-09-29 LAB — TYPE AND SCREEN
ABO/RH(D): O POS
Antibody Screen: NEGATIVE

## 2022-09-29 LAB — LACTIC ACID, PLASMA: Lactic Acid, Venous: 1.3 mmol/L (ref 0.5–1.9)

## 2022-09-29 LAB — PROTIME-INR
INR: 1.1 (ref 0.8–1.2)
Prothrombin Time: 13.9 seconds (ref 11.4–15.2)

## 2022-09-29 NOTE — Discharge Instructions (Signed)
Your history, exam, workup today did not show worsened anemia and the ultrasound did not show evidence of acute blood clot.  We suspect you have bruising and ecchymosis that has layered in your leg causing your symptoms.  Please follow-up with your primary doctor for reassessment.  If any symptoms change or worsen acutely, please return to the nearest emergency department.

## 2022-09-29 NOTE — ED Provider Notes (Signed)
4:17 PM Care assumed from Dr. Rodena Medin.  At time of transfer of care, patient is waiting for results of ultrasound of the leg to rule out DVT as cause of the bruising and skin changes.  If ultrasound is reassuring, plan of care is discharge home for outpatient follow-up.  Patient is on aspirin and Plavix and noticed hematoma over the last 2 weeks in his left leg.  Hemoglobin was not decreasing and plan of care with discharge if ultrasound is reassuring.  4:34 PM Was informed by the ultrasound technician that DVT study was negative.  Will discuss with patient and anticipate discharge home as previously planned.  Patient and family agree with plan for discharge for outpatient follow-up of bruising and ecchymosis and patient no other questions or concerns.  Patient discharged in good condition.  Clinical Impression: 1. Ecchymosis   2. Left leg pain     Disposition: Discharge  Condition: Good  I have discussed the results, Dx and Tx plan with the pt(& family if present). He/she/they expressed understanding and agree(s) with the plan. Discharge instructions discussed at great length. Strict return precautions discussed and pt &/or family have verbalized understanding of the instructions. No further questions at time of discharge.    New Prescriptions   No medications on file    Follow Up: Mila Palmer, MD [address] [place] [address] [phone]     [facility] Emergency Department at [facility] [address] [id number] mc [place] [address] [phone]       [place] Spainhower, Canary Brim, MD 09/29/22 [phone]

## 2022-09-29 NOTE — ED Provider Notes (Signed)
[place] EMERGENCY DEPARTMENT AT [facility] Provider Note   CSN: [id number] Arrival date & time: 09/29/22  1221     History  Chief Complaint  Patient presents with   Bleeding/Bruising   Leg Swelling    [name] is a 89 y.o. male.  89 year old male with prior medical history as detailed below presents for evaluation.  Patient reports that approximately 2 weeks ago he had episode of significant sharp pain to the left medial groin.  Pain improved within just a day or so.  Over the last week he has noticed a large hematoma to his left medial thigh.  Patient is on aspirin and Plavix.  He denies chest pain or shortness of breath.  He reports concern regarding large hematoma to the left medial thigh.  The area is still tender.  The area is warm.  He denies fever.  He reports seeing some ecchymosis in his calf and heel as well.  The history is provided by the patient and medical records.       Home Medications Prior to Admission medications   Medication Sig Start Date End Date Taking? Authorizing Provider  acetaminophen (TYLENOL) 500 MG tablet Take 500 mg by mouth 2 (two) times daily as needed for moderate pain.    [provider]  aspirin 81 MG tablet Take 81 mg by mouth every evening.     [provider]  bisacodyl (DULCOLAX) 5 MG EC tablet Take 5 mg by mouth daily as needed for severe constipation. Patient not taking: Reported on 08/07/2022    [provider]  clopidogrel (PLAVIX) 75 MG tablet Take 1 tablet by mouth once daily Patient taking differently: Take 75 mg by mouth every evening. 04/10/22   Croitoru, Mihai, MD  Coenzyme Q10 (COQ10) 150 MG CAPS Take 150 mg by mouth every evening.    [provider]  docusate sodium (COLACE) 100 MG capsule Take 100 mg by mouth daily.    [provider]  levothyroxine (SYNTHROID, LEVOTHROID) 25 MCG tablet Take 25 mcg by mouth daily.  02/09/14   [provider]  memantine  (NAMENDA) 10 MG tablet Take 1 tablet (10 mg total) by mouth 2 (two) times daily. 09/21/22   Marcos Eke, PA-C  midodrine (PROAMATINE) 10 MG tablet Take 0.5 tablets (5 mg total) by mouth 3 (three) times daily with meals. 06/01/22   Elgergawy, Leana Roe, MD  Multiple Vitamin (RENAL MULTIVITAMIN/ZINC PO) Take 1 tablet by mouth every evening.    [provider]  nitroGLYCERIN (NITROSTAT) 0.4 MG SL tablet DISSOLVE ONE TABLET UNDER THE TONGUE EVERY 5 MINUTES AS NEEDED FOR CHEST PAIN. Patient not taking: Reported on 08/07/2022 03/13/22   Croitoru, Mihai, MD  NOVOLIN 70/30 KWIKPEN (70-30) 100 UNIT/ML KwikPen Inject 18-55 Units into the skin 2 (two) times daily. Takes 55 units in the morning and 18 units at night. Patient not taking: Reported on 08/07/2022 04/25/22   [provider]  NOVOLIN R FLEXPEN RELION 100 UNIT/ML FlexPen Inject 10 Units as directed every evening. 06/27/22   [provider]  OVER THE COUNTER MEDICATION Take 1 tablet by mouth in the morning and at bedtime. cocoavia    [provider]  OVER THE COUNTER MEDICATION Take 1 tablet by mouth in the morning and at bedtime. Emma OTC medication    [provider]  polyethylene glycol (MIRALAX / GLYCOLAX) 17 g packet Take 17 g by mouth daily. 08/25/21   Arby Barrette, MD  rosuvastatin (  CRESTOR) 40 MG tablet Take 1 tablet by mouth once daily Patient taking differently: Take 40 mg by mouth every evening. 04/10/22   Croitoru, Rachelle Hora, MD      Allergies    Patient has no known allergies.    Review of Systems   Review of Systems  All other systems reviewed and are negative.   Physical Exam Updated Vital Signs BP (!) 143/68 (BP Location: Right Arm)   Pulse 81   Temp 98 F (36.7 C) (Oral)   Resp 18   Ht 5\' 7"  (1.702 m)   Wt 108.9 kg   SpO2 96%   BMI 37.59 kg/m  Physical Exam Vitals and nursing note reviewed.  Constitutional:      General: He is not in acute distress.    Appearance: Normal  appearance. He is well-developed.  HENT:     Head: Normocephalic and atraumatic.  Eyes:     Conjunctiva/sclera: Conjunctivae normal.     Pupils: Pupils are equal, round, and reactive to light.  Cardiovascular:     Rate and Rhythm: Normal rate and regular rhythm.     Heart sounds: Normal heart sounds.  Pulmonary:     Effort: Pulmonary effort is normal. No respiratory distress.     Breath sounds: Normal breath sounds.  Abdominal:     General: There is no distension.     Palpations: Abdomen is soft.     Tenderness: There is no abdominal tenderness.  Musculoskeletal:        General: No deformity. Normal range of motion.     Cervical back: Normal range of motion and neck supple.  Skin:    General: Skin is warm and dry.     Comments: Bruising noted to the medial aspect of the left lower extremity.  See images below.  Moderate tenderness overlying the left medial thigh.  Exam is most consistent with likely resolving hematoma.  Neurological:     General: No focal deficit present.     Mental Status: He is alert and oriented to person, place, and time.        ED Results / Procedures / Treatments   Labs (all labs ordered are listed, but only abnormal results are displayed) Labs Reviewed  CBC - Abnormal; Notable for the following components:      Result Value   Hemoglobin 12.2 (*)    RDW 15.9 (*)    All other components within normal limits  COMPREHENSIVE METABOLIC PANEL - Abnormal; Notable for the following components:   Glucose, Bld 132 (*)    BUN 50 (*)    Creatinine, Ser 2.24 (*)    Albumin 3.4 (*)    GFR, Estimated 28 (*)    All other components within normal limits  I-STAT CHEM 8, ED - Abnormal; Notable for the following components:   BUN 46 (*)    Creatinine, Ser 2.40 (*)    Glucose, Bld 104 (*)    Hemoglobin 12.2 (*)    HCT 36.0 (*)    All other components within normal limits  CULTURE, BLOOD (ROUTINE X 2)  CULTURE, BLOOD (ROUTINE X 2)  PROTIME-INR  LACTIC ACID,  PLASMA  LACTIC ACID, PLASMA  TYPE AND SCREEN    EKG EKG Interpretation  Date/Time:  Friday Sep 29 2022 14:13:42 EDT Ventricular Rate:  78 PR Interval:  189 QRS Duration: 202 QT Interval:  457 QTC Calculation: 521 R Axis:   -60 Text Interpretation: Atrial-sensed ventricular-paced rhythm No further analysis attempted due to paced  rhythm Confirmed by Kristine Royal [id number] on 09/29/2022 2:23:30 PM  Radiology No results found.  Procedures Procedures    Medications Ordered in ED Medications - No data to display  ED Course/ Medical Decision Making/ A&P                             Medical Decision Making Amount and/or Complexity of Data Reviewed Labs: ordered.    Medical Screen Complete  This patient presented to the ED with complaint of left medial thigh hematoma.  This complaint involves an extensive number of treatment options. The initial differential diagnosis includes, but is not limited to, hematoma, metabolic abnormality, infection, etc.  This presentation is: Acute, Chronic, Self-Limited, Previously Undiagnosed, Uncertain Prognosis, Complicated, Systemic Symptoms, and Threat to Life/Bodily Function  Patient with approximately 2 weeks of hematoma to the left medial thigh.  Patient presented out of concern for gradual progression of increased ecchymosis in the dependent portions of the left lower extremity.  Patient without fever.  Initial white count is 7.4.  Hemoglobin is 12.2.  Creatinine is 2.2 today.  Baseline creatinine appears to be at approximately 2.2.  Lactic acid pending.  Ultrasound of left lower extremity to rule out DVT pending.  Overall impression is that this is a resolving hematoma to the left thigh.  Oncoming ED provider -Dr. Rush Landmark aware of case and need for disposition.   Additional history obtained:  External records from outside sources obtained and reviewed including prior ED visits and prior Inpatient records.    Lab Tests:  I  ordered and personally interpreted labs.  The pertinent results include: CBC, CMP, lactic acid, i-STAT Chem-8, blood cultures,   Imaging Studies ordered:  I ordered imaging studies including ultrasound to rule out DVT in the left leg  I agree with the radiologist interpretation.   Cardiac Monitoring:  The patient was maintained on a cardiac monitor.  I personally viewed and interpreted the cardiac monitor which showed an underlying rhythm of: NSR  Problem List / ED Course:  Ecchymosis/hematoma to left medial thigh   Reevaluation:  After the interventions noted above, I reevaluated the patient and found that they have: Stayed the same   Disposition:  After consideration of the diagnostic results and the patients response to treatment, I feel that the patent would benefit from completion of ED evaluation         Final Clinical Impression(s) / ED Diagnoses Final diagnoses:  Ecchymosis    Rx / DC Orders ED Discharge Orders     None         Wynetta Fines, MD 09/29/22 [phone]

## 2022-09-29 NOTE — Progress Notes (Signed)
LLE venous duplex has been completed.  Preliminary results given to Dr. Rush Landmark.    Results can be found under chart review under CV PROC. 09/29/2022 4:37 PM Dai Mcadams RVT, RDMS

## 2022-09-29 NOTE — ED Triage Notes (Addendum)
Pt arrives via POV. Pt reports about 2 weeks ago he had severe pain to left groin. Pt reports pain improved but he noticed a large hematoma to left thigh that has been spreading down his leg. Pt reports he is on plavix. Denies injury. Denies cp or sob.

## 2022-10-04 LAB — CULTURE, BLOOD (ROUTINE X 2)
Culture: NO GROWTH
Special Requests: ADEQUATE

## 2022-10-05 ENCOUNTER — Other Ambulatory Visit: Payer: Self-pay | Admitting: Cardiovascular Disease

## 2022-10-09 NOTE — Progress Notes (Unsigned)
Cardiology Clinic Note   Patient Name: [name] Date of Encounter: 10/10/2022  Primary Care Provider:  Mila Palmer, MD Primary Cardiologist:  William Fair, MD  Patient Profile    [name] 89 year old male presents the clinic today for follow-up evaluation of his coronary artery disease and chronic combined systolic and diastolic CHF.  Past Medical History    Past Medical History:  Diagnosis Date   Abnormal Doppler ultrasound of carotid artery 12/16/2012   mildly abnormal doppler . no prior studies to compare to. follow up studies are recommended when clinically indicated.   Acute respiratory failure with hypoxia 11/2018   Blepharitis    CAD (coronary artery disease) 07/24/2013   Previously placed drug-eluting stents in the LAD and RCA in 2005, low risk perfusion study December 2011 in January 2014   Cardiomyopathy, ischemic 07/24/2013   EF 45-50% echo March 2014   Carotid stenosis, left 11/2018   on left 60%   Cerebrovascular disease 03/24/2022   CHB (complete heart block) 08/27/2019   Chronic combined systolic and diastolic heart failure 02/19/2015   CKD (chronic kidney disease) stage 3, GFR 30-59 ml/min 07/24/2013   Diabetic hypoglycemia 05/02/2021   Diabetic polyneuropathy 05/02/2021   Gastric ulcer    GI bleed    History of cancer    History of echocardiogram 08/15/2012   EF  45-50 % very difficult study, severe LVH, pacemaker, trace TR,mildly enlarged RV,; aorta mildly calcified   History of stress test 06/06/2012   low risk scan. fixed inferior defect consistant with prior infarction with moderatly reduced EF. no appreciable evidence of ischemia. 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 Jude  Accent DDDR ; this is the third generator this pt. has had   Pain due to onychomycosis of toenails of both feet 11/13/2018   Pain in joint of right shoulder 09/15/2021   Second degree AV block 03/09/2014   Sensorineural hearing loss (SNHL) of both ears 04/08/2018   Shingles    Sinus node dysfunction 03/09/2014   TIA (transient ischemic attack) 11/2018   vs stroke not seen on CT   Tinnitus, bilateral 04/08/2018   Type II diabetes mellitus    Past Surgical History:  Procedure Laterality Date   CARDIAC CATHETERIZATION  12-29-2003   rt. coronary angiography was done with a 6 french 4 cm taper coronary cath. This demonstrated  approximately 70 to 75% mildly segmental stenosis in the mid RCA before the acute margin           CARDIAC CATHETERIZATION  11-19-2003   diagnostic cath,, pt. to get second cath to have stent placed   cardiac stents     CATARACT EXTRACTION     KNEE SURGERY Right    PPM GENERATOR CHANGEOUT N/A 04/07/2019   Procedure: PPM GENERATOR CHANGEOUT;  Surgeon: William Fair, MD;  Location: [facility];  Service: Cardiovascular;  Laterality: N/A;   PROSTATE BIOPSY     prostate seed implants     SPINE SURGERY     WISDOM TOOTH EXTRACTION      Allergies  No Known Allergies  History of Present Illness    [name] has a PMH of coronary artery disease, chronic combined  systolic and diastolic CHF, CHB, PPM implanted in 1990 with generator change out 2002, 2011 and 2020, HTN, HLD, OSA CPAP intolerant, TIA, and CKD stage IV.  His PMH also includes carotid artery disease (40-50% LICA stenosis).  He was previously followed by [name].  He underwent cardiac catheterization in 2005 and received PCI and stenting to his LAD and RCA.  He had a CVA in 2020 and was placed on Plavix.  He was seen in follow-up by [name] on 10/03/2021.  During that time he remained stable from a cardiac standpoint.  His renal function prohibited up titration of his GDMT.  Consideration  for upgrading to CRT-PE device if his heart failure symptoms worsen to less than 40% were recommended.  He was admitted 1/24 with acute hypoxic respiratory failure following a syncopal event.  His creatinine peaked at 3.25.  Baseline creatinine around 2.2.  He was noted to be hypotensive.  He was placed on low-dose midodrine and metoprolol was discontinued.  He was discharged with home health and on oxygen.  His device check 06/27/2022 showed complete heart block, pacemaker dependent, stable leads and battery status.  He presented to the clinic for follow-up 08/07/2022.  He was seen by William Bamberg, [name].  He presented with his son after a recent hospitalization.  He had followed up with his PCP.  He had been taking his furosemide daily even though it was prescribed as needed.  He continued on home oxygen.  His biggest complaint was due to nasal congestion.  He was using humidification.  He had not been taking midodrine.  He denied chest pain, palpitations, dyspnea, dizziness, weight gain, and early satiety.  His CMP showed improved creatinine and stable electrolytes.  Was seen in the ED on 09/29/2022.  A reported lower extremity pain and ecchymosis.  Lower extremity venous duplex was negative for DVT.  He presents to the clinic today for follow-up evaluation and states he is getting stronger.  He presents with his daughter-in-law.  She reports she has some concern for bruising.  We reviewed his need for aspirin and Plavix therapy.  We reviewed his recent emergency department visit and H&H.  They expressed understanding.  His blood pressure today is well-controlled at 110/60.  I will continue his current medication regimen, have him increase his physical activity as tolerated and plan follow-up in 4 to 6 months.  Today he denies chest pain, shortness of breath, lower extremity edema, fatigue, palpitations, melena, hematuria, hemoptysis, diaphoresis, weakness, presyncope, syncope, orthopnea, and  PND.    Home Medications    Prior to Admission medications   Medication Sig Start Date End Date Taking? Authorizing Provider  acetaminophen (TYLENOL) 500 MG tablet Take 500 mg by mouth 2 (two) times daily as needed for moderate pain.    [provider]  aspirin 81 MG tablet Take 81 mg by mouth every evening.     [provider]  bisacodyl (DULCOLAX) 5 MG EC tablet Take 5 mg by mouth daily as needed for severe constipation. Patient not taking: Reported on 08/07/2022    [provider]  clopidogrel (PLAVIX) 75 MG tablet Take 1 tablet by mouth once daily Patient taking differently: Take 75 mg by mouth every evening. 04/10/22   Croitoru, Mihai, MD  Coenzyme Q10 (COQ10) 150 MG CAPS Take 150 mg by mouth every evening.    [provider]  docusate sodium (COLACE) 100 MG capsule Take 100 mg by mouth daily.    [provider]  levothyroxine (SYNTHROID, LEVOTHROID) 25 MCG tablet Take 25 mcg by mouth daily.  02/09/14   [provider]  memantine (NAMENDA) 10 MG tablet Take 1 tablet (10 mg total) by mouth 2 (two) times daily. 09/21/22   Marcos Eke, PA-C  midodrine (PROAMATINE) 10 MG tablet Take 0.5 tablets (5 mg total) by mouth 3 (three) times daily with meals. 06/01/22   Elgergawy, Leana Roe, MD  Multiple Vitamin (RENAL MULTIVITAMIN/ZINC PO) Take 1 tablet by mouth every evening.    [provider]  nitroGLYCERIN (NITROSTAT) 0.4 MG SL tablet DISSOLVE ONE TABLET UNDER THE TONGUE EVERY 5 MINUTES AS NEEDED FOR CHEST PAIN. Patient not taking: Reported on 08/07/2022 03/13/22   Croitoru, Mihai, MD  NOVOLIN 70/30 KWIKPEN (70-30) 100 UNIT/ML KwikPen Inject 18-55 Units into the skin 2 (two) times daily. Takes 55 units in the morning and 18 units at night. Patient not taking: Reported on 08/07/2022 04/25/22   [provider]  NOVOLIN R FLEXPEN RELION 100 UNIT/ML FlexPen Inject 10 Units as directed every evening. 06/27/22   [provider]   OVER THE COUNTER MEDICATION Take 1 tablet by mouth in the morning and at bedtime. cocoavia    [provider]  OVER THE COUNTER MEDICATION Take 1 tablet by mouth in the morning and at bedtime. Emma OTC medication    [provider]  polyethylene glycol (MIRALAX / GLYCOLAX) 17 g packet Take 17 g by mouth daily. 08/25/21   Arby Barrette, MD  rosuvastatin (CRESTOR) 40 MG tablet Take 1 tablet by mouth once daily Patient taking differently: Take 40 mg by mouth every evening. 04/10/22   Croitoru, Rachelle Hora, MD    Family History    Family History  Problem Relation Age of Onset   Memory loss Mother        with very advanced age   Heart disease Father    He indicated that his mother is deceased. He indicated that his father is deceased.  Social History    Social History   Socioeconomic History   Marital status: Married    Spouse name: Not on file   Number of children: 2   Years of education: 18   Highest education level: Not on file  Occupational History   Occupation: Retired    Comment: Engineer, manufacturing  Tobacco Use   Smoking status: Never   Smokeless tobacco: Former  Building services engineer Use: Never used  Substance and Sexual Activity   Alcohol use: Yes    Comment: very rare   Drug use: No   Sexual activity: Not Currently    Partners: Female  Other Topics Concern   Not on file  Social History Narrative   Right handed      Lives with wife, son and daughter in law      Highest level of edu- doctorate   Drinks caffeine   3 story home   Social Determinants of Health   Financial Resource Strain: Not on file  Food Insecurity: No Food Insecurity (06/05/2022)   Hunger Vital Sign    Worried About Running Out of Food in the Last Year: Never true    Ran Out of Food in the Last Year: Never true  Transportation Needs: No Transportation Needs (06/05/2022)   PRAPARE - Administrator, Civil Service (Medical): No    Lack of Transportation (Non-Medical): No   Physical Activity: Not on file  Stress: Not on file  Social Connections: Not on file  Intimate  Partner Violence: Not At Risk (05/21/2022)   Humiliation, Afraid, Rape, and Kick questionnaire    Fear of Current or Ex-Partner: No    Emotionally Abused: No    Physically Abused: No    Sexually Abused: No     Review of Systems    General:  No chills, fever, night sweats or weight changes.  Cardiovascular:  No chest pain, dyspnea on exertion, edema, orthopnea, palpitations, paroxysmal nocturnal dyspnea. Dermatological: No rash, lesions/masses Respiratory: No cough, dyspnea Urologic: No hematuria, dysuria Abdominal:   No nausea, vomiting, diarrhea, bright red blood per rectum, melena, or hematemesis Neurologic:  No visual changes, wkns, changes in mental status. All other systems reviewed and are otherwise negative except as noted above.  Physical Exam    VS:  BP 110/60   Pulse 87   Ht 5\' 9"  (1.753 m)   Wt 240 lb (108.9 kg)   SpO2 95%   BMI 35.44 kg/m  , BMI Body mass index is 35.44 kg/m. GEN: Well nourished, well developed, in no acute distress. HEENT: normal. Neck: Supple, no JVD, carotid bruits, or masses. Cardiac: RRR, no murmurs, rubs, or gallops. No clubbing, cyanosis, left greater than right generalized nonpitting edema.  Radials/DP/PT 2+ and equal bilaterally.  Respiratory:  Respirations regular and unlabored, clear to auscultation bilaterally. GI: Soft, nontender, nondistended, BS + x 4. MS: no deformity or atrophy. Skin: warm and dry, no rash. Neuro:  Strength and sensation are intact. Psych: Normal affect.  Accessory Clinical Findings    Recent Labs: 03/05/2022: TSH 2.469 05/27/2022: B Natriuretic Peptide 106.2 06/01/2022: Magnesium 2.8 09/29/2022: ALT 17; BUN 46; Creatinine, Ser 2.40; Hemoglobin 12.2; Platelets 263; Potassium 3.8; Sodium 140   Recent Lipid Panel    Component Value Date/Time   CHOL 147 03/05/2022 0640   TRIG 327 (H) 03/05/2022 0640   HDL 35  (L) 03/05/2022 0640   CHOLHDL 4.2 03/05/2022 0640   VLDL 65 (H) 03/05/2022 0640   LDLCALC 47 03/05/2022 0640   LDLDIRECT 138 (H) 04/01/2015 0927         ECG personally reviewed by me today-none today.   Echocardiogram 05/02/2021  IMPRESSIONS     1. Left ventricular ejection fraction, by estimation, is 40 to 45%. The  left ventricle has mildly decreased function.   Conclusion(s)/Recommendation(s): Very difficult windows. Prior studies  have shown no great windows either. Grossly no significant changes in EF  compared to study 11/25/2018.   FINDINGS   Left Ventricle: Left ventricular ejection fraction, by estimation, is 40  to 45%. The left ventricle has mildly decreased function. Definity  contrast agent was given IV to delineate the left ventricular endocardial  borders.     LEFT VENTRICLE  PLAX 2D  LVIDd:         2.85 cm   Diastology  LVIDs:         2.80 cm   LV e' medial:    3.65 cm/s  LV PW:         1.55 cm   LV E/e' medial:  22.7  LV IVS:        1.65 cm   LV e' lateral:   9.73 cm/s  LVOT diam:     2.20 cm   LV E/e' lateral: 8.5  LV SV:         66  LV SV Index:   28  LVOT Area:     3.80 cm     RIGHT VENTRICLE  RV S prime:  8.27 cm/s  TAPSE (M-mode): 1.8 cm   LEFT ATRIUM           Index       RIGHT ATRIUM           Index  LA Vol (A2C): 19.4 ml 8.30 ml/m  RA Area:     11.20 cm  LA Vol (A4C): 21.9 ml 9.37 ml/m  RA Volume:   22.10 ml  9.46 ml/m   AORTIC VALVE  LVOT Vmax:   114.00 cm/s  LVOT Vmean:  70.400 cm/s  LVOT VTI:    0.173 m    AORTA  Ao Asc diam: 3.60 cm   MITRAL VALVE  MV Area (PHT): 5.68 cm     SHUNTS  MV Decel Time: 134 msec     Systemic VTI:  0.17 m  MV E velocity: 83.00 cm/s   Systemic Diam: 2.20 cm  MV A velocity: 118.00 cm/s  MV E/A ratio:  0.70   Mary Advertising account executive signed by Carolan Clines  Signature Date/Time: 05/02/2021/5:53:56 PM      Assessment & Plan   1.  Chronic combined systolic and diastolic CHF- left  greater than right generalized nonpitting edema.  Weight today 240. Continue current medical therapy Heart healthy low-sodium diet Daily weights  Coronary artery disease-no chest pain today.  Cardiac catheterization in 2005 with stenting to his LAD and RCA. Continue aspirin, clopidogrel, rosuvastatin Heart healthy low-sodium diet  Essential hypertension-BP today 110/60.  Previously needed midodrine therapy to maintain blood pressure.  Metoprolol was discontinued at that time.  Now back on midodrine. Maintain blood pressure log  Hyperlipidemia-compliant with rosuvastatin. Follows with PCP High-fiber diet  Complete heart block, PPM-status post dual-chamber Saint Jude PPM. Stable on last check.  Disposition: Follow-up with [name] or me in 4-6 months.   Thomasene Ripple. Aliou Mealey [name]-C     10/10/2022, 1:59 PM [facility] [address] Office [phone] Fax [phone]    I spent 13 minutes examining this patient, reviewing medications, and using patient centered shared decision making involving her cardiac care.  Prior to her visit I spent greater than 20 minutes reviewing her past medical history,  medications, and prior cardiac tests.

## 2022-10-10 ENCOUNTER — Ambulatory Visit: Payer: Medicare Other | Attending: General Practice | Admitting: General Practice

## 2022-10-10 ENCOUNTER — Encounter: Payer: Self-pay | Admitting: General Practice

## 2022-10-10 VITALS — BP 110/60 | HR 87 | Ht 69.0 in | Wt 240.0 lb

## 2022-10-10 DIAGNOSIS — Z950 Presence of cardiac pacemaker: Secondary | ICD-10-CM | POA: Diagnosis not present

## 2022-10-10 DIAGNOSIS — I10 Essential (primary) hypertension: Secondary | ICD-10-CM

## 2022-10-10 DIAGNOSIS — E782 Mixed hyperlipidemia: Secondary | ICD-10-CM | POA: Diagnosis not present

## 2022-10-10 DIAGNOSIS — I442 Atrioventricular block, complete: Secondary | ICD-10-CM

## 2022-10-10 DIAGNOSIS — I5042 Chronic combined systolic (congestive) and diastolic (congestive) heart failure: Secondary | ICD-10-CM

## 2022-10-10 DIAGNOSIS — I2510 Atherosclerotic heart disease of native coronary artery without angina pectoris: Secondary | ICD-10-CM | POA: Diagnosis not present

## 2022-10-10 NOTE — Patient Instructions (Signed)
Medication Instructions:  Your physician recommends that you continue on your current medications as directed. Please refer to the Current Medication list given to you today.  *If you need a refill on your cardiac medications before your next appointment, please call your pharmacy*   Lab Work: NONE ordered at this time of appointment   If you have labs (blood work) drawn today and your tests are completely normal, you will receive your results only by: MyChart Message (if you have MyChart) OR A paper copy in the mail If you have any lab test that is abnormal or we need to change your treatment, we will call you to review the results.   Testing/Procedures: NONE ordered at this time of appointment     Follow-Up: At [facility], you and your health needs are our priority.  As part of our continuing mission to provide you with exceptional heart care, we have created designated Provider Care Teams.  These Care Teams include your primary Cardiologist (physician) and Advanced Practice Providers (APPs -  Physician Assistants and Nurse Practitioners) who all work together to provide you with the care you need, when you need it.  We recommend signing up for the patient portal called "MyChart".  Sign up information is provided on this After Visit Summary.  MyChart is used to connect with patients for Virtual Visits (Telemedicine).  Patients are able to view lab/test results, encounter notes, upcoming appointments, etc.  Non-urgent messages can be sent to your provider as well.   To learn more about what you can do with MyChart, go to [email or web address].    Your next appointment:   4-6 month(s)  Provider:   Thurmon Fair, MD  or Edd Fabian, FNP        Other Instructions Increase Physical Activity as tolerated.

## 2022-10-15 ENCOUNTER — Other Ambulatory Visit: Payer: Self-pay | Admitting: Physician Assistant

## 2022-10-18 DIAGNOSIS — N184 Chronic kidney disease, stage 4 (severe): Secondary | ICD-10-CM | POA: Diagnosis not present

## 2022-10-18 DIAGNOSIS — N2581 Secondary hyperparathyroidism of renal origin: Secondary | ICD-10-CM | POA: Diagnosis not present

## 2022-10-18 DIAGNOSIS — I495 Sick sinus syndrome: Secondary | ICD-10-CM | POA: Diagnosis not present

## 2022-10-18 DIAGNOSIS — E1122 Type 2 diabetes mellitus with diabetic chronic kidney disease: Secondary | ICD-10-CM | POA: Diagnosis not present

## 2022-10-18 DIAGNOSIS — I129 Hypertensive chronic kidney disease with stage 1 through stage 4 chronic kidney disease, or unspecified chronic kidney disease: Secondary | ICD-10-CM | POA: Diagnosis not present

## 2022-10-18 NOTE — Progress Notes (Signed)
Remote pacemaker transmission.   

## 2022-10-19 ENCOUNTER — Ambulatory Visit (INDEPENDENT_AMBULATORY_CARE_PROVIDER_SITE_OTHER): Payer: Medicare Other | Admitting: Physician Assistant

## 2022-10-19 ENCOUNTER — Encounter: Payer: Self-pay | Admitting: Physician Assistant

## 2022-10-19 VITALS — BP 164/97 | HR 74 | Resp 20 | Ht 69.0 in | Wt 238.0 lb

## 2022-10-19 DIAGNOSIS — F01A0 Vascular dementia, mild, without behavioral disturbance, psychotic disturbance, mood disturbance, and anxiety: Secondary | ICD-10-CM | POA: Diagnosis not present

## 2022-10-19 MED ORDER — MEMANTINE HCL 10 MG PO TABS: 10.0000 mg | ORAL_TABLET | Freq: Two times a day (BID) | ORAL | 3 refills | Status: DC

## 2022-10-19 NOTE — Progress Notes (Signed)
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 Patient is currently on memantine 10 mg twice daily, tolerating well.  In today's visit, he is MMSE is 28/30 with a slight change in his recall (2/3).  He is recently recovering from his wife's recent passing which may have affected his score.  He reports that he is trying to increase socialization in the near future.  He still able to perform ADLs to his ability.  He no longer drives.  Follow up in 6 months. Recommend good control of her cardiovascular risk factors Continue to control mood as per PCP.  If up to it, he could benefit from psychotherapy/grief counseling. Recommend using CPAP for OSA Continue memantine 10 mg twice daily, side effects discussed Continue physical therapy, for strength and balance.  Patient is using the walker for stability at this time. Increase socialization     Subjective:    This patient is accompanied in the office by his daughter in law  who supplements the history.  Previous records as well as any outside records available were reviewed prior to todays visit. Patient was last seen on 04/06/22      Any changes in memory since last visit?  "Memory is about the same, still forgetting the most common things ".  He is wife passed 2 weeks ago, which may have affected his memory to a certain extent.  So if you know that is this weekend.  "I am dealing with it ".  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  He is also trying to reconnect with all of friends from [place]. repeats oneself?  Endorsed Disoriented when walking into a room?  Patient denies Leaving objects in unusual places?    denies   Wandering behavior?  denies   Any personality changes since last visit?  denies   Any worsening depression?:  As mentioned above, the patient has recently lost his wife and is trying to cope with the loss. Hallucinations or paranoia?  denies   Seizures?    denies    Any sleep changes?  Sleeps on a Laz-boy. Denies vivid dreams, REM behavior or sleepwalking   Sleep apnea?   denies   Any hygiene concerns?  Caregiver gives him a shower and change him.  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 Independent of bathing and dressing?  Endorsed  Does the patient needs help with medications?  Daughter in law and son are in charge  Who is in charge of the finances?  Son is in charge    Any changes in appetite?  denies    Patient have trouble swallowing?  denies   Does the patient cook?  Any kitchen accidents such as leaving the stove on? Patient denies   Any headaches?   denies   Chronic back pain  denies   Ambulates with difficulty?  Walks regularly throughout the day with the walker.  As mentioned above, he has right frozen shoulder which causes  decrease in mobility which affects his ADLs.  Daughter in law reports that he is "moving better since his last visit".   Recent falls or head injuries? denies     Unilateral weakness, numbness or tingling?    denies   Any tremors?  denies   Any anosmia?  Patient denies   Any incontinence of urine?  denies   Any bowel dysfunction?     denies      Patient lives with daughter in law and son Does the patient drive?no longer drives      Neurocognitive Testing 03/30/22 Dr. Milbert Coulter Briefly, results suggested an isolated impairment surrounding attention/concentration. Performance variability was exhibited across processing  speed, semantic fluency, and confrontation naming. However, it should be highlighted that testing was completed in English rather than his native [place] and that this fact by itself could certainly account for weaker scores across expressive language tasks rather than neurological decline. Despite what can be found in previous medical records, memory testing is certainly not suggestive of Alzheimer's disease as all aspects of memory testing were appropriate when compared to age-matched peers. Regarding etiology, the most likely cause for ongoing dysfunction appears to be a combination of cerebrovascular disease and other complex medical ailments. 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 Testing patterns align with the expected impact of these ongoing ailments and neuroimaging findings well.    Initial visit 07/2021 The patient is seen in neurologic consultation at the request of Mila Palmer, MD for the evaluation of memory.  The patient is accompanied by his daughter-in-law who supplements the history.  His wife is on the phone to participate in supplement information. This is a 89 y.o. year old RH  male originally from [place] who initially reported having memory issues since December 2022, when he presented to the ED, with 4 day history of confusion.  Work-up was suspicious for hypoglycemia, versus TIA versus dementia.  After discharge, he was seen by his PCP, who referred him here for further evaluation.  After questioning, he reports that "the memory problems may have been longer than that, when I was in [place], I could not even remember [place] words which obviously I was fluent in ".  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  He used to exhibit his photos at [employer], and some of them are not familiar to him.  He finds himself repeating his questions and telling the same stories.  Sometimes he cannot remember long-term information, such as his sister's birthday.  He needs more time to think the answers than before.  He denies being disoriented when walking into a room.  He denies leaving objects in unusual places.  He ambulates with some difficulty, he has a right frozen shoulder, and has some balance issues.  He denies any head injuries or recent falls.  [name] does the driving, as the patient has "several small strokes and diabetes, and when the low sugar comes, he shakes and closes worry".  The patient lives with his wife and his daughter-in-law who noticed the same changes.  His mood is overall stable, but lately, he has been more preoccupied, occasionally double and triple checking his wallet for  example.  He denies depression, although he finds himself bored, seems he is computer has not been working for a few days, and "I am lost without it, waiting until he gets repaired ".  He does report some irritability due to the frustration of not being able to remember as before. He does not sleep well, or feels rested, because of his right frozen shoulder, which causes discomfort when he is moving in the bed.  He tried PT in the past, "it never worked ".  He denies vivid dreams, occasionally he may have a nightmare, denies sleepwalking, hallucinations or paranoia.  He denies any hygiene concerns, he needs help with bathing and dressing because of the shoulder.  He has been always meticulous about his medications, but his daughter-in-law helps him monitor her.  He is wife is an Airline pilot, and has always been in charge of the finances.  His appetite is somewhat decreased "he is less enthusiastic about food lately  because of the situation".  He denies trouble swallowing.  He admits to not drinking enough water.  He does not cook.  He denies any significant headaches, occasionally he may have a migraine which resolves quickly.  He denies double vision, dizziness, focal numbness, or tingling.  He has a right frozen shoulder as mentioned before, denies tremors or anosmia.  No history of seizures.  Denies urine incontinence, retention, he does have issues with constipation, denies diarrhea.  20 years ago, he was diagnosed with OSA, but he never wanted to use his CPAP.  He denies alcohol or tobacco.  Family history of 1 aunt with Alzheimer's disease   Pertinent labs A1c 7.5 (high), LDL 77 TSH 2.91, vitamin B12 311   CT of the head without contrast 04/22/2021 No acute intracranial process.. 2. Atrophy with extensive chronic microvascular ischemic changes.3. Chronic right maxillary sinus disease.  PREVIOUS MEDICATIONS:   CURRENT MEDICATIONS:  Outpatient Encounter Medications as of 10/19/2022  Medication Sig   acetaminophen (TYLENOL) 500 MG tablet Take 500 mg by mouth 2 (two) times daily as needed for moderate pain.   albuterol (PROVENTIL) 2 MG tablet Take 2 mg by mouth daily.   aspirin 81 MG tablet Take 81 mg by mouth every evening.    bisacodyl (DULCOLAX) 5 MG EC tablet Take 5 mg by mouth daily as needed for severe constipation.   clopidogrel (PLAVIX) 75 MG tablet Take 1 tablet by mouth once daily (Patient taking differently: Take 75 mg by mouth every evening.)   Coenzyme Q10 (COQ10) 150 MG CAPS Take 150 mg by mouth every evening.   docusate sodium (COLACE) 100 MG capsule Take 100 mg by mouth daily.   levothyroxine (SYNTHROID, LEVOTHROID) 25 MCG tablet Take 25 mcg by mouth daily.    midodrine (PROAMATINE) 10 MG tablet Take 0.5 tablets (5 mg total) by mouth 3 (three) times daily with meals. (Patient taking differently: Take 5 mg by mouth 3 (three) times daily with meals. As needed)   Multiple Vitamin (RENAL  MULTIVITAMIN/ZINC PO) Take 1 tablet by mouth every evening.   nitroGLYCERIN (NITROSTAT) 0.4 MG SL tablet DISSOLVE ONE TABLET UNDER THE TONGUE EVERY 5 MINUTES AS NEEDED FOR CHEST PAIN.   NOVOLIN 70/30 KWIKPEN (70-30) 100 UNIT/ML KwikPen Inject 18-55 Units into the skin 2 (two) times daily. Takes 55 units in the morning and 18 units at night.   NOVOLIN R FLEXPEN RELION 100 UNIT/ML FlexPen Inject 10 Units as directed every evening.   OVER THE COUNTER MEDICATION Take 1 tablet by mouth  in the morning and at bedtime. cocoavia   OVER THE COUNTER MEDICATION Take 1 tablet by mouth in the morning and at bedtime. Emma OTC medication   polyethylene glycol (MIRALAX / GLYCOLAX) 17 g packet Take 17 g by mouth daily.   rosuvastatin (CRESTOR) 40 MG tablet Take 1 tablet by mouth once daily (Patient taking differently: Take 40 mg by mouth every evening.)   [DISCONTINUED] memantine (NAMENDA) 10 MG tablet Take 1 tablet by mouth twice daily   memantine (NAMENDA) 10 MG tablet Take 1 tablet (10 mg total) by mouth 2 (two) times daily.   Facility-Administered Encounter Medications as of 10/19/2022  Medication   betamethasone acetate-betamethasone sodium phosphate (CELESTONE) injection 3 mg       10/19/2022    3:00 PM  MMSE - Mini Mental State Exam  Orientation to time 5  Orientation to Place 5  Registration 3  Attention/ Calculation 5  Recall 2  Language- name 2 objects 2  Language- repeat 1  Language- follow 3 step command 3  Language- read & follow direction 1  Write a sentence 0  Copy design 1  Total score 28      07/22/2021    7:00 AM  Montreal Cognitive Assessment   Visuospatial/ Executive (0/5) 4  Naming (0/3) 2  Attention: Read list of digits (0/2) 1  Attention: Read list of letters (0/1) 1  Attention: Serial 7 subtraction starting at 100 (0/3) 2  Language: Repeat phrase (0/2) 2  Language : Fluency (0/1) 0  Abstraction (0/2) 2  Delayed Recall (0/5) 0  Orientation (0/6) 6  Total 20  Adjusted  Score (based on education) 20    Objective:     PHYSICAL EXAMINATION:    VITALS:   Vitals:   10/19/22 1256  BP: (!) 164/97  Pulse: 74  Resp: 20  SpO2: 98%  Weight: 238 lb (108 kg)  Height: 5\' 9"  (1.753 m)    GEN:  The patient appears stated age and is in NAD. HEENT:  Normocephalic, atraumatic.   Neurological examination:  General: NAD, well-groomed, appears stated age. Orientation: The patient is alert. Oriented to person, place and date Cranial nerves: There is good facial symmetry.The speech is fluent and clear. No aphasia or dysarthria. Fund of knowledge is appropriate. Recent and remote memory are impaired. Attention and concentration are reduced.  Able to name objects and repeat phrases.  Hearing is intact to conversational tone.  Sensation: Sensation is intact to light touch throughout Motor: Strength is at least antigravity x4. DTR's 1/4 in UE/LE     Movement examination: Tone: There is normal tone in the UE/LE Abnormal movements:  no tremor.  No myoclonus.  No asterixis.   Coordination:  There is no decremation with RAM's. Normal finger to nose  Gait and Station: The patient has some difficulty arising out of a deep-seated chair without the use of the hands. The patient's stride length is short, gait is cautious and narrow.    Thank you for allowing [place] the opportunity to participate in the care of this nice patient. Please do not hesitate to contact us for any questions or concerns.   Total time spent on today's visit was 30 minutes dedicated to this patient today, preparing to see patient, examining the patient, ordering tests and/or medications and counseling the patient, documenting clinical information in the EHR or other health record, independently interpreting results and communicating results to the patient/family, discussing treatment and goals, answering patient's questions and coordinating care.  Cc:  Mila Palmer, MD  Marlowe Kays 10/19/2022 3:48 PM

## 2022-10-19 NOTE — Patient Instructions (Signed)
It was a pleasure to see you today at our office.   Recommendations:  Follow up 6 month Continue Memantine 10 mg daily.   Increase socializing   Whom to call:  Memory  decline, memory medications: Call our office [phone]   For psychiatric meds, mood meds: Please have your primary care physician manage these medications.    For assessment of decision of mental capacity and competency:  Call Dr. Erick Blinks, geriatric psychiatrist at [phone]  For guidance in geriatric dementia issues please call [facility] [phone]  For guidance regarding [facility] and if placement were needed at the facility, contact Sidney Ace, Social Worker tel: [phone]  If you have any severe symptoms of a stroke, or other severe issues such as confusion,severe chills or fever, etc call 911 or go to the ER as you may need to be evaluated further   Feel free to visit Facebook page " [email or web address]" for tips of how to care for people with memory problems.    RECOMMENDATIONS FOR ALL PATIENTS WITH MEMORY PROBLEMS: 1. Continue to exercise (Recommend 30 minutes of walking everyday, or 3 hours every week) 2. Increase social interactions - continue going to [place] and enjoy social gatherings with friends and family 3. Eat healthy, avoid fried foods and eat more fruits and vegetables 4. Maintain adequate blood pressure, blood sugar, and blood cholesterol level. Reducing the risk of stroke and cardiovascular disease also helps promoting better memory. 5. Avoid stressful situations. Live a simple life and avoid aggravations. Organize your time and prepare for the next day in anticipation. 6. Sleep well, avoid any interruptions of sleep and avoid any distractions in the bedroom that may interfere with adequate sleep quality 7. Avoid sugar, avoid sweets as there is a strong link between excessive sugar intake, diabetes, and cognitive impairment We discussed the Mediterranean  diet, which has been shown to help patients reduce the risk of progressive memory disorders and reduces cardiovascular risk. This includes eating fish, eat fruits and green leafy vegetables, nuts like almonds and hazelnuts, walnuts, and also use olive oil. Avoid fast foods and fried foods as much as possible. Avoid sweets and sugar as sugar use has been linked to worsening of memory function.  There is always a concern of gradual progression of memory problems. If this is the case, then we may need to adjust level of care according to patient needs. Support, both to the patient and caregiver, should then be put into place.    The Alzheimer's Association is here all day, every day for people facing Alzheimer's disease through our free 24/7 Helpline: [phone]. The Helpline provides reliable information and support to all those who need assistance, such as individuals living with memory loss, Alzheimer's or other dementia, caregivers, health care professionals and the public.  Our highly trained and knowledgeable staff can help you with: Understanding memory loss, dementia and Alzheimer's  Medications and other treatment options  General information about aging and brain health  Skills to provide quality care and to find the best care from professionals  Legal, financial and living-arrangement decisions Our Helpline also features: Confidential care consultation provided by master's level clinicians who can help with decision-making support, crisis assistance and education on issues families face every day  Help in a caller's preferred language using our translation service that features more than 200 languages and dialects  Referrals to local community programs, services and ongoing support     FALL PRECAUTIONS: Be cautious when walking.  Scan the area for obstacles that may increase the risk of trips and falls. When getting up in the mornings, sit up at the edge of the bed for a few minutes  before getting out of bed. Consider elevating the bed at the head end to avoid drop of blood pressure when getting up. Walk always in a well-lit room (use night lights in the walls). Avoid area rugs or power cords from appliances in the middle of the walkways. Use a walker or a cane if necessary and consider physical therapy for balance exercise. Get your eyesight checked regularly.  FINANCIAL OVERSIGHT: Supervision, especially oversight when making financial decisions or transactions is also recommended.  HOME SAFETY: Consider the safety of the kitchen when operating appliances like stoves, microwave oven, and blender. Consider having supervision and share cooking responsibilities until no longer able to participate in those. Accidents with firearms and other hazards in the house should be identified and addressed as well.   ABILITY TO BE LEFT ALONE: If patient is unable to contact 911 operator, consider using LifeLine, or when the need is there, arrange for someone to stay with patients. Smoking is a fire hazard, consider supervision or cessation. Risk of wandering should be assessed by caregiver and if detected at any point, supervision and safe proof recommendations should be instituted.  MEDICATION SUPERVISION: Inability to self-administer medication needs to be constantly addressed. Implement a mechanism to ensure safe administration of the medications.        Mediterranean Diet A Mediterranean diet refers to food and lifestyle choices that are based on the traditions of countries located on the [employer]. This way of eating has been shown to help prevent certain conditions and improve outcomes for people who have chronic diseases, like kidney disease and heart disease. What are tips for following this plan? Lifestyle  Cook and eat meals together with your family, when possible. Drink enough fluid to keep your urine clear or pale yellow. Be physically active every day. This  includes: Aerobic exercise like running or swimming. Leisure activities like gardening, walking, or housework. Get 7-8 hours of sleep each night. If recommended by your health care provider, drink red wine in moderation. This means 1 glass a day for nonpregnant women and 2 glasses a day for men. A glass of wine equals 5 oz (150 mL). Reading food labels  Check the serving size of packaged foods. For foods such as rice and pasta, the serving size refers to the amount of cooked product, not dry. Check the total fat in packaged foods. Avoid foods that have saturated fat or trans fats. Check the ingredients list for added sugars, such as corn syrup. Shopping  At the grocery store, buy most of your food from the areas near the walls of the store. This includes: Fresh fruits and vegetables (produce). Grains, beans, nuts, and seeds. Some of these may be available in unpackaged forms or large amounts (in bulk). Fresh seafood. Poultry and eggs. Low-fat dairy products. Buy whole ingredients instead of prepackaged foods. Buy fresh fruits and vegetables in-season from local farmers markets. Buy frozen fruits and vegetables in resealable bags. If you do not have access to quality fresh seafood, buy precooked frozen shrimp or canned fish, such as tuna, salmon, or sardines. Buy small amounts of raw or cooked vegetables, salads, or olives from the deli or salad bar at your store. Stock your pantry so you always have certain foods on hand, such as olive oil, canned tuna, canned tomatoes,  rice, pasta, and beans. Cooking  Cook foods with extra-virgin olive oil instead of using butter or other vegetable oils. Have meat as a side dish, and have vegetables or grains as your main dish. This means having meat in small portions or adding small amounts of meat to foods like pasta or stew. Use beans or vegetables instead of meat in common dishes like chili or lasagna. Experiment with different cooking methods. Try  roasting or broiling vegetables instead of steaming or sauteing them. Add frozen vegetables to soups, stews, pasta, or rice. Add nuts or seeds for added healthy fat at each meal. You can add these to yogurt, salads, or vegetable dishes. Marinate fish or vegetables using olive oil, lemon juice, garlic, and fresh herbs. Meal planning  Plan to eat 1 vegetarian meal one day each week. Try to work up to 2 vegetarian meals, if possible. Eat seafood 2 or more times a week. Have healthy snacks readily available, such as: Vegetable sticks with hummus. Greek yogurt. Fruit and nut trail mix. Eat balanced meals throughout the week. This includes: Fruit: 2-3 servings a day Vegetables: 4-5 servings a day Low-fat dairy: 2 servings a day Fish, poultry, or lean meat: 1 serving a day Beans and legumes: 2 or more servings a week Nuts and seeds: 1-2 servings a day Whole grains: 6-8 servings a day Extra-virgin olive oil: 3-4 servings a day Limit red meat and sweets to only a few servings a month What are my food choices? Mediterranean diet Recommended Grains: Whole-grain pasta. Brown rice. Bulgar wheat. Polenta. Couscous. Whole-wheat bread. Orpah Cobb. Vegetables: Artichokes. Beets. Broccoli. Cabbage. Carrots. Eggplant. Green beans. Chard. Kale. Spinach. Onions. Leeks. Peas. Squash. Tomatoes. Peppers. Radishes. Fruits: Apples. Apricots. Avocado. Berries. Bananas. Cherries. Dates. Figs. Grapes. Lemons. Melon. Oranges. Peaches. Plums. Pomegranate. Meats and other protein foods: Beans. Almonds. Sunflower seeds. Pine nuts. Peanuts. Cod. Salmon. Scallops. Shrimp. Tuna. Tilapia. Clams. Oysters. Eggs. Dairy: Low-fat milk. Cheese. Greek yogurt. Beverages: Water. Red wine. Herbal tea. Fats and oils: Extra virgin olive oil. Avocado oil. Grape seed oil. Sweets and desserts: [place] yogurt with honey. Baked apples. Poached pears. Trail mix. Seasoning and other foods: Basil. Cilantro. Coriander. Cumin. Mint.  Parsley. Sage. Rosemary. Tarragon. Garlic. Oregano. Thyme. Pepper. Balsalmic vinegar. Tahini. Hummus. Tomato sauce. Olives. Mushrooms. Limit these Grains: Prepackaged pasta or rice dishes. Prepackaged cereal with added sugar. Vegetables: Deep fried potatoes (french fries). Fruits: Fruit canned in syrup. Meats and other protein foods: Beef. Pork. Lamb. Poultry with skin. Hot dogs. Tomasa Blase. Dairy: Ice cream. Sour cream. Whole milk. Beverages: Juice. Sugar-sweetened soft drinks. Beer. Liquor and spirits. Fats and oils: Butter. Canola oil. Vegetable oil. Beef fat (tallow). Lard. Sweets and desserts: Cookies. Cakes. Pies. Candy. Seasoning and other foods: Mayonnaise. Premade sauces and marinades. The items listed may not be a complete list. Talk with your dietitian about what dietary choices are right for you. Summary The Mediterranean diet includes both food and lifestyle choices. Eat a variety of fresh fruits and vegetables, beans, nuts, seeds, and whole grains. Limit the amount of red meat and sweets that you eat. Talk with your health care provider about whether it is safe for you to drink red wine in moderation. This means 1 glass a day for nonpregnant women and 2 glasses a day for men. A glass of wine equals 5 oz (150 mL). This information is not intended to replace advice given to you by your health care provider. Make sure you discuss any questions you have with your health  care provider. Document Released: 12/30/2015 Document Revised: 02/01/2016 Document Reviewed: 12/30/2015 Elsevier Interactive Patient Education  2017 [employer].

## 2022-11-02 DIAGNOSIS — F4321 Adjustment disorder with depressed mood: Secondary | ICD-10-CM | POA: Diagnosis not present

## 2022-11-02 DIAGNOSIS — H9209 Otalgia, unspecified ear: Secondary | ICD-10-CM | POA: Diagnosis not present

## 2022-11-08 ENCOUNTER — Ambulatory Visit: Payer: Medicare Other | Admitting: Podiatry

## 2022-12-26 ENCOUNTER — Ambulatory Visit (INDEPENDENT_AMBULATORY_CARE_PROVIDER_SITE_OTHER): Payer: Medicare Other

## 2022-12-26 DIAGNOSIS — I442 Atrioventricular block, complete: Secondary | ICD-10-CM | POA: Diagnosis not present

## 2022-12-26 DIAGNOSIS — I495 Sick sinus syndrome: Secondary | ICD-10-CM

## 2022-12-27 ENCOUNTER — Encounter: Payer: Self-pay | Admitting: Podiatry

## 2022-12-27 ENCOUNTER — Ambulatory Visit (INDEPENDENT_AMBULATORY_CARE_PROVIDER_SITE_OTHER): Payer: Medicare Other | Admitting: Podiatry

## 2022-12-27 DIAGNOSIS — E1140 Type 2 diabetes mellitus with diabetic neuropathy, unspecified: Secondary | ICD-10-CM

## 2022-12-27 DIAGNOSIS — B351 Tinea unguium: Secondary | ICD-10-CM | POA: Diagnosis not present

## 2022-12-27 DIAGNOSIS — M79675 Pain in left toe(s): Secondary | ICD-10-CM

## 2022-12-27 DIAGNOSIS — Z794 Long term (current) use of insulin: Secondary | ICD-10-CM

## 2022-12-27 DIAGNOSIS — M79674 Pain in right toe(s): Secondary | ICD-10-CM | POA: Diagnosis not present

## 2022-12-27 NOTE — Progress Notes (Signed)
This patient returns to my office for at risk foot care.  This patient requires this care by a professional since this patient will be at risk due to having diabetes and chronic kidney disease and coagulation defect.  Patient is taking plavix. This patient is unable to cut nails himself since the patient cannot reach his nails.These nails are painful walking and wearing shoes.  This patient presents for at risk foot care today.  General Appearance  Alert, conversant and in no acute stress.  Vascular  Dorsalis pedis and posterior tibial  pulses are palpable  bilaterally.  Capillary return is within normal limits  bilaterally. Temperature is within normal limits  bilaterally.  Neurologic  Senn-Weinstein monofilament wire test within normal limits  bilaterally. Muscle power within normal limits bilaterally.  Nails Thick disfigured discolored nails with subungual debris  from hallux to fifth toes bilaterally. No evidence of bacterial infection or drainage bilaterally.  Orthopedic  No limitations of motion  feet .  No crepitus or effusions noted.  No bony pathology or digital deformities noted.  Skin  normotropic skin with no porokeratosis noted bilaterally.  No signs of infections or ulcers noted.     Onychomycosis  Pain in right toes  Pain in left toes  Consent was obtained for treatment procedures.   Mechanical debridement of nails 1-5  bilaterally performed with a nail nipper.  Filed with dremel without incident.    Return office visit   3 months                  Told patient to return for periodic foot care and evaluation due to potential at risk complications.   [name] DPM

## 2023-01-01 DIAGNOSIS — E119 Type 2 diabetes mellitus without complications: Secondary | ICD-10-CM | POA: Diagnosis not present

## 2023-01-01 DIAGNOSIS — E1140 Type 2 diabetes mellitus with diabetic neuropathy, unspecified: Secondary | ICD-10-CM | POA: Diagnosis not present

## 2023-01-01 DIAGNOSIS — Z79899 Other long term (current) drug therapy: Secondary | ICD-10-CM | POA: Diagnosis not present

## 2023-01-01 DIAGNOSIS — N184 Chronic kidney disease, stage 4 (severe): Secondary | ICD-10-CM | POA: Diagnosis not present

## 2023-01-11 NOTE — Progress Notes (Signed)
Remote pacemaker transmission.   

## 2023-02-05 ENCOUNTER — Other Ambulatory Visit: Payer: Self-pay | Admitting: Cardiovascular Disease

## 2023-02-06 DIAGNOSIS — N2581 Secondary hyperparathyroidism of renal origin: Secondary | ICD-10-CM | POA: Diagnosis not present

## 2023-02-06 DIAGNOSIS — N184 Chronic kidney disease, stage 4 (severe): Secondary | ICD-10-CM | POA: Diagnosis not present

## 2023-02-06 DIAGNOSIS — I129 Hypertensive chronic kidney disease with stage 1 through stage 4 chronic kidney disease, or unspecified chronic kidney disease: Secondary | ICD-10-CM | POA: Diagnosis not present

## 2023-02-06 DIAGNOSIS — E1122 Type 2 diabetes mellitus with diabetic chronic kidney disease: Secondary | ICD-10-CM | POA: Diagnosis not present

## 2023-02-06 DIAGNOSIS — I495 Sick sinus syndrome: Secondary | ICD-10-CM | POA: Diagnosis not present

## 2023-02-19 NOTE — Progress Notes (Signed)
Cardiology Office Note    Date:  02/19/2023   ID:  [name], DOB 05/24/1931, MRN [id number]  PCP:  Mila Palmer, MD  Cardiologist:   Thurmon Fair, MD   No chief complaint on file.   History of Present Illness:  [name] is a 89 y.o. male with a dual-chamber permanent pacemaker, implanted for sinus bradycardia and intermittent second degree AV block now complete heart block (St. Jude Assurity, implanted in 1990, with generator changes in 2002, 2011 and 2020), CAD with remote stent procedures to LAD and RCA, and chronic combined systolic and diastolic heart failure. Additional problems include chronic respiratory failure on home O2, OSA not on CPAP, minor nonobstructive coronary atherosclerosis, insulin requiring type 2 diabetes mellitus, hyperlipidemia, hypertension in the setting of morbid obesity, CKD stage IV.  His physical limitations are most related to severe low back pain and lower extremity neuropathy, chronic constipation.  He has no cardiovascular complaints other than exertional dyspnea.  He gets around the house with a rolling walker.  Still enjoys taking photographs, now using his cell phone.  He does not have orthopnea or PND.  He has not had problems with edema and does not take any diuretics.  Denies chest pain.  He was hospitalized in January 2024 with a syncopal event in the setting of COVID infection.  He had significant lung infiltrates on chest CT and required oxygen.  He required treatment with antiretrovirals and steroids but has been on oxygen supplementation ever since.  Pacemaker interrogation shows normal device function.  Lead parameters are normal and estimated generator longevity 6 years.  He has 14% atrial pacing and 100% ventricular pacing.  He has had rare brief episodes (maximum 10 seconds) of paroxysmal atrial tachycardia, but no atrial fibrillation.  He has not had any high ventricular rates.  Most recent echo shows EF 40-45%, images are  always very difficult, even with Definity contrast.  Metabolic parameters are pretty good with HDL 35, LDL 47, hemoglobin A1c 7.5%.  He has chronic moderate to severe kidney disease with a creatinine most recently 2.14 (creatinine has been in the 1.9-3 range since 2016, GFR 2 -30).  He is on clopidogrel following a stroke in July 2020, from which he has recovered without sequelae. He has a 40-59% L ICA stenosis (60% by CTA).  He underwent staged LAD and RCA revascularization with drug-eluting stents in 2005 and has not required subsequent percutaneous revascularization procedures. His most recent nuclear study in January 2014 showed an EF of 34% with a fixed inferior defect and no reversible ischemia. Most recent echo LVEF 40-45%.  He has moderate to severe chronic kidney disease, likely secondary to hypertensive/diabetic nephropathy.  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 He is now retired and is an avid Environmental manager. Now mostly photographing flowers.  Wt Readings from Last 3 Encounters:  10/19/22 238 lb (108 kg)  10/10/22 240 lb (108.9 kg)  09/29/22 240 lb (108.9 kg)      Studies Reviewed: .   09/29/2022 ECG shows atrial sensed, ventricular paced rhythm  ECHO 05/02/2021:  1. Left ventricular ejection fraction, by estimation, is 40 to 45%. The  left ventricle has mildly decreased function.   Conclusion(s)/Recommendation(s): Very difficult windows. Prior studies  have shown no great windows either. Grossly no significant changes in EF  compared to study 11/25/2018.   Risk Assessment/Calculations:     Lipid Panel  Component Value Date/Time   CHOL 147 03/05/2022 0640   TRIG 327 (H) 03/05/2022 0640   HDL 35 (L) 03/05/2022 0640   CHOLHDL 4.2 03/05/2022 0640   VLDL 65 (H) 03/05/2022 0640   LDLCALC 47 03/05/2022 0640   LDLDIRECT 138 (H) 04/01/2015 0927       Latest Ref Rng & Units 09/29/2022    2:46 PM 09/29/2022    12:57 PM 08/07/2022    2:41 PM  BMP  Glucose 70 - 99 mg/dL 161  096  95   BUN 8 - 23 mg/dL 46  50  39   Creatinine 0.61 - 1.24 mg/dL 0.45  4.09  8.11   BUN/Creat Ratio 10 - 24   21   Sodium 135 - 145 mmol/L 140  138  141   Potassium 3.5 - 5.1 mmol/L 3.8  4.0  4.9   Chloride 98 - 111 mmol/L 103  101  100   CO2 22 - 32 mmol/L  26  26   Calcium 8.9 - 10.3 mg/dL  9.4  9.7   91/47/8295 creatinine 2.14  01/01/2023 hemoglobin A1c 7.5%, hemoglobin 14  ASSESSMENT:    1. Chronic combined systolic and diastolic heart failure (HCC)   2. SSS (sick sinus syndrome) (HCC)   3. CHB (complete heart block) (HCC)   4. Pacemaker - Assurity MRI compatible. placed 03-2019   5. Coronary artery disease involving native coronary artery of native heart without angina pectoris   6. Controlled type 2 diabetes mellitus with stage 4 chronic kidney disease, with long-term current use of insulin (HCC)   7. Essential hypertension   8. Severe obesity (BMI 35.0-39.9) with comorbidity (HCC)   9. Mixed hyperlipidemia   10. CKD (chronic kidney disease) stage 4, GFR 15-29 ml/min (HCC)   11. OSA (obstructive sleep apnea)   12. History of arterial ischemic stroke       PLAN:  In order of problems listed above:  CHF: .Marland Kitchen Limited functional status due to noncardiac illness, clinically euvolemic without diuretics.  Most recent EF estimated to be 40-45%.  On ARB and beta blocker, renal function (GFR 25-30) limits other meds. Consider CRT, but EF still >EF <40% and high risk of complications. SSS: Actually rarely requires atrial pacing. 3rd deg AVB: Pacemaker dependent.  Very broad paced QRS.  Consider upgrade to CRT-P device if heart failure worsens and EF less than 40%.  SGLT2 inhibitors considered, but I worry about the risk of fungal infections with his immobility. PPM: Normal device function. Download Q3 months.  Although his pacemaker generator is MRI conditional, his leads (SJM atrial 1022T, ventricular 1216T) are not  MRI compatible. CAD: He does not have angina.  He is on dual antiplatelet and highly active statin therapy as well as beta-blockers.  S/p DES stenting proximal LAD stenosis and DX2 POBA, June, 2005 and staged RCA DES, August, 2005.  Avoid contrast based procedures with worsening renal function, unless he has clear-cut acute coronary syndrome. DM: hemoglobin A1c acceptable at 7.5%.  Followed by Dr. Sharl Ma. HTN: Adequate control Severe obesity:   Numerous comorbid conditions associated with this. HLP: maximum dose rosuvastatin (caution with advanced CKD), without side effects CKD stage 4:  likely primarily diabetic nephropathy.  Remarkably, there has been only mild progression of his kidney disease in the last 8 years or so. Chronic respiratory failure with hypoxia: He has been using oxygen ever since his COVID-19 infection around New Year's 2024.  Obesity and history of untreated sleep apnea probably  contributory. History of stroke: Carotid ultrasound in July 2021 showed mild plaque without obstruction.  Previous studies had suggested some degree of left carotid stenosis. No atrial fibrillation on pacemaker.  On chronic clopidogrel.   Medication Adjustments/Labs and Tests Ordered: Current medicines are reviewed at length with the patient today.  Concerns regarding medicines are outlined above.  Medication changes, Labs and Tests ordered today are listed in the Patient Instructions below. There are no Patient Instructions on file for this visit.         Dispo: Continue remote pacemaker downloads every 3 months.  Follow-up in 1 year.  Signed, Thurmon Fair, MD

## 2023-02-20 ENCOUNTER — Encounter: Payer: Self-pay | Admitting: Cardiovascular Disease

## 2023-02-20 ENCOUNTER — Ambulatory Visit: Payer: Medicare Other | Attending: Cardiovascular Disease | Admitting: Cardiovascular Disease

## 2023-02-20 VITALS — BP 130/70 | HR 76 | Ht 64.0 in | Wt 236.2 lb

## 2023-02-20 DIAGNOSIS — E782 Mixed hyperlipidemia: Secondary | ICD-10-CM | POA: Diagnosis not present

## 2023-02-20 DIAGNOSIS — E1122 Type 2 diabetes mellitus with diabetic chronic kidney disease: Secondary | ICD-10-CM | POA: Diagnosis not present

## 2023-02-20 DIAGNOSIS — G4733 Obstructive sleep apnea (adult) (pediatric): Secondary | ICD-10-CM | POA: Insufficient documentation

## 2023-02-20 DIAGNOSIS — I10 Essential (primary) hypertension: Secondary | ICD-10-CM | POA: Insufficient documentation

## 2023-02-20 DIAGNOSIS — I495 Sick sinus syndrome: Secondary | ICD-10-CM | POA: Insufficient documentation

## 2023-02-20 DIAGNOSIS — I442 Atrioventricular block, complete: Secondary | ICD-10-CM | POA: Diagnosis not present

## 2023-02-20 DIAGNOSIS — Z950 Presence of cardiac pacemaker: Secondary | ICD-10-CM | POA: Diagnosis not present

## 2023-02-20 DIAGNOSIS — Z794 Long term (current) use of insulin: Secondary | ICD-10-CM | POA: Insufficient documentation

## 2023-02-20 DIAGNOSIS — I2510 Atherosclerotic heart disease of native coronary artery without angina pectoris: Secondary | ICD-10-CM | POA: Diagnosis not present

## 2023-02-20 DIAGNOSIS — Z8673 Personal history of transient ischemic attack (TIA), and cerebral infarction without residual deficits: Secondary | ICD-10-CM | POA: Insufficient documentation

## 2023-02-20 DIAGNOSIS — I5042 Chronic combined systolic (congestive) and diastolic (congestive) heart failure: Secondary | ICD-10-CM | POA: Insufficient documentation

## 2023-02-20 DIAGNOSIS — N184 Chronic kidney disease, stage 4 (severe): Secondary | ICD-10-CM | POA: Diagnosis not present

## 2023-02-20 MED ORDER — METOPROLOL SUCCINATE ER 25 MG PO TB24: 25.0000 mg | ORAL_TABLET | Freq: Every day | ORAL | 3 refills | Status: DC

## 2023-02-20 NOTE — Patient Instructions (Signed)
Medication Instructions:  Metoprolol Succinate 25 mg daily *If you need a refill on your cardiac medications before your next appointment, please call your pharmacy*  Follow-Up: At [facility], you and your health needs are our priority.  As part of our continuing mission to provide you with exceptional heart care, we have created designated Provider Care Teams.  These Care Teams include your primary Cardiologist (physician) and Advanced Practice Providers (APPs -  Physician Assistants and Nurse Practitioners) who all work together to provide you with the care you need, when you need it.  We recommend signing up for the patient portal called "MyChart".  Sign up information is provided on this After Visit Summary.  MyChart is used to connect with patients for Virtual Visits (Telemedicine).  Patients are able to view lab/test results, encounter notes, upcoming appointments, etc.  Non-urgent messages can be sent to your provider as well.   To learn more about what you can do with MyChart, go to [email or web address].    Your next appointment:   1 year(s)  Provider:   Thurmon Fair, MD

## 2023-02-26 ENCOUNTER — Emergency Department (HOSPITAL_COMMUNITY): Payer: Medicare Other

## 2023-02-26 ENCOUNTER — Encounter (HOSPITAL_COMMUNITY): Payer: Self-pay

## 2023-02-26 ENCOUNTER — Emergency Department (HOSPITAL_COMMUNITY)
Admission: EM | Admit: 2023-02-26 | Discharge: 2023-02-27 | Disposition: A | Discharge status: Home / Self Care | Payer: Medicare Other | Source: Home / Self Care | Attending: Emergency Medicine | Admitting: Emergency Medicine

## 2023-02-26 ENCOUNTER — Other Ambulatory Visit: Payer: Self-pay

## 2023-02-26 DIAGNOSIS — I509 Heart failure, unspecified: Secondary | ICD-10-CM | POA: Insufficient documentation

## 2023-02-26 DIAGNOSIS — I2510 Atherosclerotic heart disease of native coronary artery without angina pectoris: Secondary | ICD-10-CM | POA: Insufficient documentation

## 2023-02-26 DIAGNOSIS — Z1152 Encounter for screening for COVID-19: Secondary | ICD-10-CM | POA: Insufficient documentation

## 2023-02-26 DIAGNOSIS — E039 Hypothyroidism, unspecified: Secondary | ICD-10-CM | POA: Insufficient documentation

## 2023-02-26 DIAGNOSIS — D72829 Elevated white blood cell count, unspecified: Secondary | ICD-10-CM | POA: Insufficient documentation

## 2023-02-26 DIAGNOSIS — N184 Chronic kidney disease, stage 4 (severe): Secondary | ICD-10-CM | POA: Insufficient documentation

## 2023-02-26 DIAGNOSIS — K8020 Calculus of gallbladder without cholecystitis without obstruction: Secondary | ICD-10-CM | POA: Insufficient documentation

## 2023-02-26 DIAGNOSIS — I517 Cardiomegaly: Secondary | ICD-10-CM | POA: Diagnosis not present

## 2023-02-26 DIAGNOSIS — K5900 Constipation, unspecified: Secondary | ICD-10-CM | POA: Diagnosis not present

## 2023-02-26 DIAGNOSIS — R0602 Shortness of breath: Secondary | ICD-10-CM | POA: Diagnosis not present

## 2023-02-26 DIAGNOSIS — Z950 Presence of cardiac pacemaker: Secondary | ICD-10-CM | POA: Diagnosis not present

## 2023-02-26 DIAGNOSIS — Z794 Long term (current) use of insulin: Secondary | ICD-10-CM | POA: Insufficient documentation

## 2023-02-26 DIAGNOSIS — R0789 Other chest pain: Secondary | ICD-10-CM | POA: Diagnosis not present

## 2023-02-26 DIAGNOSIS — E1122 Type 2 diabetes mellitus with diabetic chronic kidney disease: Secondary | ICD-10-CM | POA: Insufficient documentation

## 2023-02-26 DIAGNOSIS — R109 Unspecified abdominal pain: Secondary | ICD-10-CM | POA: Diagnosis not present

## 2023-02-26 DIAGNOSIS — K828 Other specified diseases of gallbladder: Secondary | ICD-10-CM | POA: Diagnosis not present

## 2023-02-26 NOTE — ED Triage Notes (Signed)
Complaining of shortness of breath and low blood pressure that started at 10 pm. Had some pressure in the chest at that point. The lowest point was 90/42. Now the pressure is relieved after passing flatus.

## 2023-02-27 ENCOUNTER — Emergency Department (HOSPITAL_COMMUNITY): Payer: Medicare Other

## 2023-02-27 DIAGNOSIS — K828 Other specified diseases of gallbladder: Secondary | ICD-10-CM | POA: Diagnosis not present

## 2023-02-27 DIAGNOSIS — R109 Unspecified abdominal pain: Secondary | ICD-10-CM | POA: Diagnosis not present

## 2023-02-27 DIAGNOSIS — K5900 Constipation, unspecified: Secondary | ICD-10-CM | POA: Diagnosis not present

## 2023-02-27 DIAGNOSIS — K8020 Calculus of gallbladder without cholecystitis without obstruction: Secondary | ICD-10-CM | POA: Diagnosis not present

## 2023-02-27 LAB — BASIC METABOLIC PANEL
Anion gap: 17 — ABNORMAL HIGH (ref 5–15)
BUN: 82 mg/dL — ABNORMAL HIGH (ref 8–23)
CO2: 19 mmol/L — ABNORMAL LOW (ref 22–32)
Calcium: 8.8 mg/dL — ABNORMAL LOW (ref 8.9–10.3)
Chloride: 102 mmol/L (ref 98–111)
Creatinine, Ser: 2.54 mg/dL — ABNORMAL HIGH (ref 0.61–1.24)
GFR, Estimated: 24 mL/min — ABNORMAL LOW (ref >60)
Glucose, Bld: 184 mg/dL — ABNORMAL HIGH (ref 70–99)
Potassium: 5.6 mmol/L — ABNORMAL HIGH (ref 3.5–5.1)
Sodium: 138 mmol/L (ref 135–145)

## 2023-02-27 LAB — CBC
HCT: 37.2 % — ABNORMAL LOW (ref 39.0–52.0)
Hemoglobin: 11.6 g/dL — ABNORMAL LOW (ref 13.0–17.0)
MCH: 29.1 pg (ref 26.0–34.0)
MCHC: 31.2 g/dL (ref 30.0–36.0)
MCV: 93.2 fL (ref 80.0–100.0)
Platelets: 197 10*3/uL (ref 150–400)
RBC: 3.99 MIL/uL — ABNORMAL LOW (ref 4.22–5.81)
RDW: 13.7 % (ref 11.5–15.5)
WBC: 14 10*3/uL — ABNORMAL HIGH (ref 4.0–10.5)
nRBC: 0 % (ref 0.0–0.2)

## 2023-02-27 LAB — HEPATIC FUNCTION PANEL
ALT: 15 U/L (ref 0–44)
AST: 16 U/L (ref 15–41)
Albumin: 3.2 g/dL — ABNORMAL LOW (ref 3.5–5.0)
Alkaline Phosphatase: 37 U/L — ABNORMAL LOW (ref 38–126)
Bilirubin, Direct: <0.1 mg/dL (ref 0.0–0.2)
Total Bilirubin: 0.8 mg/dL (ref 0.3–1.2)
Total Protein: 6.2 g/dL — ABNORMAL LOW (ref 6.5–8.1)

## 2023-02-27 LAB — CBG MONITORING, ED: Glucose-Capillary: 259 mg/dL — ABNORMAL HIGH (ref 70–99)

## 2023-02-27 LAB — TROPONIN I (HIGH SENSITIVITY): Troponin I (High Sensitivity): 24 ng/L — ABNORMAL HIGH (ref <18)

## 2023-02-27 LAB — LIPASE, BLOOD: Lipase: 62 U/L — ABNORMAL HIGH (ref 11–51)

## 2023-02-27 LAB — SARS CORONAVIRUS 2 BY RT PCR: SARS Coronavirus 2 by RT PCR: NEGATIVE

## 2023-02-27 MED ORDER — ACETAMINOPHEN 500 MG PO TABS
1000.0000 mg | ORAL_TABLET | Freq: Once | ORAL | Status: AC
  Administered 2023-02-27: 1000 mg via ORAL
  Filled 2023-02-27: qty 2

## 2023-02-27 NOTE — ED Provider Notes (Signed)
[place] EMERGENCY DEPARTMENT AT [facility] Provider Note   CSN: [id number] Arrival date & time: 02/26/23  2308     History  Chief Complaint  Patient presents with   Shortness of Breath    [name] is a 89 y.o. male.  Pt is a 89 yo male with pmhx significant for CAD, CHF, CHB s/p pacemaker (St. Jude), Chronic resp failure (2L oxygen at home), DM2, OSA, hypothyroidism, CVA, hx gi bleed, CKD and HLD.  Pt developed some sob last night. He did have some cp.  He feels like he might be constipated.  He waited several hours in the waiting room and had a bowel movement while waiting.  He feels a little better.  Pt's bp was low at home, but is normal now.  Pt denies any current cp or sob.  No fever.  Some chills last night.       Home Medications Prior to Admission medications   Medication Sig Start Date End Date Taking? Authorizing Provider  acetaminophen (TYLENOL) 500 MG tablet Take 500 mg by mouth 2 (two) times daily as needed for moderate pain.    [provider]  albuterol (PROVENTIL) 2 MG tablet Take 2 mg by mouth daily.    [provider]  aspirin 81 MG tablet Take 81 mg by mouth every evening.     [provider]  bisacodyl (DULCOLAX) 5 MG EC tablet Take 5 mg by mouth daily as needed for severe constipation.    [provider]  calcitRIOL (ROCALTROL) 0.25 MCG capsule Take 0.25 mcg by mouth daily. 02/05/23   [provider]  clopidogrel (PLAVIX) 75 MG tablet Take 1 tablet by mouth once daily Patient taking differently: Take 75 mg by mouth every evening. 04/10/22   Croitoru, Mihai, MD  Coenzyme Q10 (COQ10) 150 MG CAPS Take 150 mg by mouth every evening.    [provider]  docusate sodium (COLACE) 100 MG capsule Take 100 mg by mouth daily.    [provider]  levothyroxine (SYNTHROID, LEVOTHROID) 25 MCG tablet Take 25 mcg by mouth daily.  02/09/14   [provider]  memantine (NAMENDA) 10 MG  tablet Take 1 tablet (10 mg total) by mouth 2 (two) times daily. 10/19/22   Marcos Eke, PA-C  metoprolol succinate (TOPROL XL) 25 MG 24 hr tablet Take 1 tablet (25 mg total) by mouth daily. 02/20/23   Croitoru, Mihai, MD  Multiple Vitamin (RENAL MULTIVITAMIN/ZINC PO) Take 1 tablet by mouth every evening.    [provider]  nitroGLYCERIN (NITROSTAT) 0.4 MG SL tablet DISSOLVE ONE TABLET UNDER THE TONGUE EVERY 5 MINUTES AS NEEDED FOR CHEST PAIN. 03/13/22   Croitoru, Mihai, MD  NOVOLIN 70/30 KWIKPEN (70-30) 100 UNIT/ML KwikPen Inject 18-55 Units into the skin 2 (two) times daily. Takes 55 units in the morning and 18 units at night. 04/25/22   [provider]  NOVOLIN R FLEXPEN RELION 100 UNIT/ML FlexPen Inject 10 Units as directed every evening. 06/27/22   [provider]  OVER THE COUNTER MEDICATION Take 1 tablet by mouth in the morning and at bedtime. cocoavia    [provider]  OVER THE COUNTER MEDICATION Take 1 tablet by mouth in the morning and at bedtime. Emma OTC medication    [provider]  polyethylene glycol (MIRALAX / GLYCOLAX) 17 g packet Take 17 g by mouth daily. 08/25/21   Arby Barrette, MD  rosuvastatin (CRESTOR) 40 MG tablet Take 1 tablet  by mouth once daily 02/06/23   Croitoru, Rachelle Hora, MD      Allergies    Patient has no known allergies.    Review of Systems   Review of Systems  Respiratory:  Positive for shortness of breath.   Cardiovascular:  Positive for chest pain.  Gastrointestinal:  Positive for constipation.  All other systems reviewed and are negative.   Physical Exam Updated Vital Signs BP 127/85 (BP Location: Right Arm)   Pulse 78   Temp 98.3 F (36.8 C) (Oral)   Resp 18   Ht 5\' 4"  (1.626 m)   Wt 107 kg   SpO2 100%   BMI 40.51 kg/m  Physical Exam Vitals and nursing note reviewed.  Constitutional:      Appearance: He is well-developed.  HENT:     Head: Normocephalic and atraumatic.     Mouth/Throat:      Mouth: Mucous membranes are moist.     Pharynx: Oropharynx is clear.  Eyes:     Extraocular Movements: Extraocular movements intact.     Pupils: Pupils are equal, round, and reactive to light.  Cardiovascular:     Rate and Rhythm: Normal rate and regular rhythm.  Pulmonary:     Effort: Pulmonary effort is normal.     Breath sounds: Normal breath sounds.  Abdominal:     General: Bowel sounds are normal.     Palpations: Abdomen is soft.  Musculoskeletal:        General: Normal range of motion.     Cervical back: Normal range of motion and neck supple.  Skin:    General: Skin is warm.     Capillary Refill: Capillary refill takes less than 2 seconds.  Neurological:     General: No focal deficit present.     Mental Status: He is alert and oriented to person, place, and time.  Psychiatric:        Mood and Affect: Mood normal.        Behavior: Behavior normal.     ED Results / Procedures / Treatments   Labs (all labs ordered are listed, but only abnormal results are displayed) Labs Reviewed  BASIC METABOLIC PANEL - Abnormal; Notable for the following components:      Result Value   Potassium 5.6 (*)    CO2 19 (*)    Glucose, Bld 184 (*)    BUN 82 (*)    Creatinine, Ser 2.54 (*)    Calcium 8.8 (*)    GFR, Estimated 24 (*)    Anion gap 17 (*)    All other components within normal limits  CBC - Abnormal; Notable for the following components:   WBC 14.0 (*)    RBC 3.99 (*)    Hemoglobin 11.6 (*)    HCT 37.2 (*)    All other components within normal limits  HEPATIC FUNCTION PANEL - Abnormal; Notable for the following components:   Total Protein 6.2 (*)    Albumin 3.2 (*)    Alkaline Phosphatase 37 (*)    All other components within normal limits  LIPASE, BLOOD - Abnormal; Notable for the following components:   Lipase 62 (*)    All other components within normal limits  CBG MONITORING, ED - Abnormal; Notable for the following components:   Glucose-Capillary 259 (*)     All other components within normal limits  TROPONIN I (HIGH SENSITIVITY) - Abnormal; Notable for the following components:   Troponin I (High Sensitivity) 24 (*)  All other components within normal limits  SARS CORONAVIRUS 2 BY RT PCR  I-STAT CG4 LACTIC ACID, ED  I-STAT CG4 LACTIC ACID, ED  TROPONIN I (HIGH SENSITIVITY)    EKG EKG Interpretation Date/Time:  Tuesday February 27 2023 00:00:13 EDT Ventricular Rate:  83 PR Interval:  194 QRS Duration:  172 QT Interval:  432 QTC Calculation: 507 R Axis:   -64  Text Interpretation: Atrial-sensed ventricular-paced rhythm Abnormal ECG When compared with ECG of 29-Sep-2022 14:13, PREVIOUS ECG IS PRESENT No significant change since last tracing Confirmed by Jacalyn Lefevre [phone]) on 02/27/2023 7:32:13 AM  Radiology US Abdomen Limited RUQ (LIVER/GB)  Result Date: 02/27/2023 CLINICAL DATA:  Pain EXAM: ULTRASOUND ABDOMEN LIMITED RIGHT UPPER QUADRANT COMPARISON:  CT 05/27/2022 FINDINGS: Gallbladder: Gallbladder is underdistended. There is some echogenic lobular areas along the margin consistent with some stones that are shadowing. No wall thickening or adjacent fluid. Common bile duct: Diameter: 4 mm Liver: No focal lesion identified. Within normal limits in parenchymal echogenicity. Portal vein is patent on color Doppler imaging with normal direction of blood flow towards the liver. Other: Evaluation significantly limited by overlapping bowel gas and soft tissue. IMPRESSION: Limited examination. Gallbladder is mildly distended and has multiple stones. No wall thickening, reported sonographic Murphy's sign or ductal dilatation. Electronically Signed   By: Karen Kays M.D.   On: 02/27/2023 10:23   DG Abdomen 1 View  Result Date: 02/27/2023 CLINICAL DATA:  Constipation.  No pain. EXAM: ABDOMEN - 1 VIEW COMPARISON:  10/01/2021. FINDINGS: The bowel gas pattern is non-obstructive. Small-to-moderate stool burden. No evidence of pneumoperitoneum, within  the limitations of a supine film. No acute osseous abnormalities. There are multiple subcentimeter sized calcifications overlying the right upper quadrant, likely related to cholelithiasis. Surgical changes, devices, tubes and lines: Prostatic radiation brachytherapy seeds noted overlying the pubic symphysis. IMPRESSION: 1. Nonobstructive bowel gas pattern. Electronically Signed   By: Jules Schick M.D.   On: 02/27/2023 08:31   DG Chest 2 View  Result Date: 02/27/2023 CLINICAL DATA:  Chest tightness and shortness of breath EXAM: CHEST - 2 VIEW COMPARISON:  Radiographs 07/06/2022 FINDINGS: Stable cardiomegaly. Right chest wall pacemaker. Bibasilar atelectasis/scarring. Otherwise no focal consolidation. No pleural effusion or pneumothorax. No displaced rib fractures. IMPRESSION: No active cardiopulmonary disease.  Cardiomegaly. Electronically Signed   By: Minerva Fester M.D.   On: 02/27/2023 00:24    Procedures Procedures    Medications Ordered in ED Medications - No data to display  ED Course/ Medical Decision Making/ A&P                                 Medical Decision Making Amount and/or Complexity of Data Reviewed Labs: ordered. Radiology: ordered.   This patient presents to the ED for concern of sob, this involves an extensive number of treatment options, and is a complaint that carries with it a high risk of complications and morbidity.  The differential diagnosis includes chf, pna, covid, electrolyte abn, anemia   Co morbidities that complicate the patient evaluation   CAD, CHF, CHB s/p pacemaker (St. Jude), Chronic resp failure (2L oxygen at home), DM2, OSA, hypothyroidism, CVA, hx gi bleed, CKD and HLD   Additional history obtained:  Additional history obtained from epic chart review External records from outside source obtained and reviewed including daughter in law   Lab Tests:  I Ordered, and personally interpreted labs.  The pertinent results include:  cbc with  hgb low at 11.6 (stable); wbc sl elevated at 14, bmp with k sl elevated at 5.6, but 82 and cr 2.54 (stable), trop 24    Imaging Studies ordered:  I ordered imaging studies including cxr and abd xr and gb US I independently visualized and interpreted imaging which showed  CXR: No active cardiopulmonary disease.  Cardiomegaly.  Abd: Nonobstructive bowel gas pattern.  GB US: Limited examination. Gallbladder is mildly distended and has  multiple stones. No wall thickening, reported sonographic Murphy's  sign or ductal dilatation.   I agree with the radiologist interpretation   Cardiac Monitoring:  The patient was maintained on a cardiac monitor.  I personally viewed and interpreted the cardiac monitored which showed an underlying rhythm of: nsr   Medicines ordered and prescription drug management:   I have reviewed the patients home medicines and have made adjustments as needed   Test Considered:  [place]  Problem List / ED Course:  SOB:  pt is oxygenating well on his nl 2L.  No pna.  No covid.  No evidence of MI.  Trop taken 10 hrs after event nl Abd pain:  pt did feel better after bm.  GB US with gallstones.  No cholecystitis.  TB nl.  Pt is stable for d/c.  He is given a gb diet.  Return if worse.    Reevaluation:  After the interventions noted above, I reevaluated the patient and found that they have :improved   Social Determinants of Health:  Lives at home with family   Dispostion:  After consideration of the diagnostic results and the patients response to treatment, I feel that the patent would benefit from discharge with outpatient f/u.          Final Clinical Impression(s) / ED Diagnoses Final diagnoses:  Calculus of gallbladder without cholecystitis without obstruction  CKD (chronic kidney disease) stage 4, GFR 15-29 ml/min (HCC)    Rx / DC Orders ED Discharge Orders     None         Jacalyn Lefevre, MD 02/27/23 1038

## 2023-03-01 ENCOUNTER — Encounter (HOSPITAL_BASED_OUTPATIENT_CLINIC_OR_DEPARTMENT_OTHER): Payer: Self-pay

## 2023-03-01 ENCOUNTER — Other Ambulatory Visit: Payer: Self-pay

## 2023-03-01 ENCOUNTER — Inpatient Hospital Stay (HOSPITAL_BASED_OUTPATIENT_CLINIC_OR_DEPARTMENT_OTHER)
Admission: EM | Admit: 2023-03-01 | Discharge: 2023-03-04 | DRG: 378 | Disposition: A | Discharge status: Home / Self Care | Payer: Medicare Other | Attending: Family Medicine | Admitting: Family Medicine

## 2023-03-01 DIAGNOSIS — K5909 Other constipation: Secondary | ICD-10-CM | POA: Diagnosis present

## 2023-03-01 DIAGNOSIS — K250 Acute gastric ulcer with hemorrhage: Secondary | ICD-10-CM | POA: Diagnosis not present

## 2023-03-01 DIAGNOSIS — K921 Melena: Secondary | ICD-10-CM | POA: Diagnosis not present

## 2023-03-01 DIAGNOSIS — D72828 Other elevated white blood cell count: Secondary | ICD-10-CM | POA: Diagnosis present

## 2023-03-01 DIAGNOSIS — D62 Acute posthemorrhagic anemia: Secondary | ICD-10-CM | POA: Insufficient documentation

## 2023-03-01 DIAGNOSIS — E039 Hypothyroidism, unspecified: Secondary | ICD-10-CM | POA: Diagnosis not present

## 2023-03-01 DIAGNOSIS — Z8249 Family history of ischemic heart disease and other diseases of the circulatory system: Secondary | ICD-10-CM | POA: Diagnosis not present

## 2023-03-01 DIAGNOSIS — Z950 Presence of cardiac pacemaker: Secondary | ICD-10-CM | POA: Diagnosis not present

## 2023-03-01 DIAGNOSIS — Z955 Presence of coronary angioplasty implant and graft: Secondary | ICD-10-CM

## 2023-03-01 DIAGNOSIS — I10 Essential (primary) hypertension: Secondary | ICD-10-CM | POA: Diagnosis not present

## 2023-03-01 DIAGNOSIS — Z79890 Hormone replacement therapy (postmenopausal): Secondary | ICD-10-CM | POA: Diagnosis not present

## 2023-03-01 DIAGNOSIS — R159 Full incontinence of feces: Secondary | ICD-10-CM | POA: Diagnosis present

## 2023-03-01 DIAGNOSIS — K259 Gastric ulcer, unspecified as acute or chronic, without hemorrhage or perforation: Secondary | ICD-10-CM | POA: Insufficient documentation

## 2023-03-01 DIAGNOSIS — I2510 Atherosclerotic heart disease of native coronary artery without angina pectoris: Secondary | ICD-10-CM | POA: Diagnosis not present

## 2023-03-01 DIAGNOSIS — E875 Hyperkalemia: Secondary | ICD-10-CM | POA: Diagnosis present

## 2023-03-01 DIAGNOSIS — K922 Gastrointestinal hemorrhage, unspecified: Principal | ICD-10-CM | POA: Diagnosis present

## 2023-03-01 DIAGNOSIS — E119 Type 2 diabetes mellitus without complications: Secondary | ICD-10-CM

## 2023-03-01 DIAGNOSIS — E1122 Type 2 diabetes mellitus with diabetic chronic kidney disease: Secondary | ICD-10-CM | POA: Diagnosis present

## 2023-03-01 DIAGNOSIS — R109 Unspecified abdominal pain: Secondary | ICD-10-CM | POA: Diagnosis not present

## 2023-03-01 DIAGNOSIS — Z1152 Encounter for screening for COVID-19: Secondary | ICD-10-CM | POA: Diagnosis not present

## 2023-03-01 DIAGNOSIS — I255 Ischemic cardiomyopathy: Secondary | ICD-10-CM | POA: Diagnosis present

## 2023-03-01 DIAGNOSIS — Z6841 Body Mass Index (BMI) 40.0 and over, adult: Secondary | ICD-10-CM

## 2023-03-01 DIAGNOSIS — F01A0 Vascular dementia, mild, without behavioral disturbance, psychotic disturbance, mood disturbance, and anxiety: Secondary | ICD-10-CM | POA: Diagnosis present

## 2023-03-01 DIAGNOSIS — K254 Chronic or unspecified gastric ulcer with hemorrhage: Secondary | ICD-10-CM | POA: Diagnosis not present

## 2023-03-01 DIAGNOSIS — E782 Mixed hyperlipidemia: Secondary | ICD-10-CM | POA: Diagnosis present

## 2023-03-01 DIAGNOSIS — N184 Chronic kidney disease, stage 4 (severe): Secondary | ICD-10-CM | POA: Diagnosis present

## 2023-03-01 DIAGNOSIS — E1142 Type 2 diabetes mellitus with diabetic polyneuropathy: Secondary | ICD-10-CM | POA: Diagnosis present

## 2023-03-01 DIAGNOSIS — Z7902 Long term (current) use of antithrombotics/antiplatelets: Secondary | ICD-10-CM

## 2023-03-01 DIAGNOSIS — I5042 Chronic combined systolic (congestive) and diastolic (congestive) heart failure: Secondary | ICD-10-CM | POA: Diagnosis present

## 2023-03-01 DIAGNOSIS — Z7901 Long term (current) use of anticoagulants: Secondary | ICD-10-CM | POA: Diagnosis not present

## 2023-03-01 DIAGNOSIS — Z9981 Dependence on supplemental oxygen: Secondary | ICD-10-CM

## 2023-03-01 DIAGNOSIS — Z79899 Other long term (current) drug therapy: Secondary | ICD-10-CM

## 2023-03-01 DIAGNOSIS — Z87891 Personal history of nicotine dependence: Secondary | ICD-10-CM | POA: Diagnosis not present

## 2023-03-01 DIAGNOSIS — Z7982 Long term (current) use of aspirin: Secondary | ICD-10-CM

## 2023-03-01 DIAGNOSIS — Z794 Long term (current) use of insulin: Secondary | ICD-10-CM | POA: Diagnosis not present

## 2023-03-01 DIAGNOSIS — K8020 Calculus of gallbladder without cholecystitis without obstruction: Secondary | ICD-10-CM | POA: Diagnosis present

## 2023-03-01 DIAGNOSIS — Z23 Encounter for immunization: Secondary | ICD-10-CM

## 2023-03-01 DIAGNOSIS — K3189 Other diseases of stomach and duodenum: Secondary | ICD-10-CM | POA: Diagnosis not present

## 2023-03-01 DIAGNOSIS — Z8673 Personal history of transient ischemic attack (TIA), and cerebral infarction without residual deficits: Secondary | ICD-10-CM

## 2023-03-01 LAB — HEMOGLOBIN AND HEMATOCRIT, BLOOD
HCT: 27.3 % — ABNORMAL LOW (ref 39.0–52.0)
HCT: 25.7 % — ABNORMAL LOW (ref 39.0–52.0)
Hemoglobin: 8.6 g/dL — ABNORMAL LOW (ref 13.0–17.0)
Hemoglobin: 8.2 g/dL — ABNORMAL LOW (ref 13.0–17.0)

## 2023-03-01 LAB — COMPREHENSIVE METABOLIC PANEL
ALT: 11 U/L (ref 0–44)
AST: 15 U/L (ref 15–41)
Albumin: 3.8 g/dL (ref 3.5–5.0)
Alkaline Phosphatase: 35 U/L — ABNORMAL LOW (ref 38–126)
Anion gap: 7 (ref 5–15)
BUN: 98 mg/dL — ABNORMAL HIGH (ref 8–23)
CO2: 28 mmol/L (ref 22–32)
Calcium: 9.2 mg/dL (ref 8.9–10.3)
Chloride: 105 mmol/L (ref 98–111)
Creatinine, Ser: 2.67 mg/dL — ABNORMAL HIGH (ref 0.61–1.24)
GFR, Estimated: 22 mL/min — ABNORMAL LOW (ref >60)
Glucose, Bld: 157 mg/dL — ABNORMAL HIGH (ref 70–99)
Potassium: 3.9 mmol/L (ref 3.5–5.1)
Sodium: 140 mmol/L (ref 135–145)
Total Bilirubin: 0.4 mg/dL (ref 0.3–1.2)
Total Protein: 6.2 g/dL — ABNORMAL LOW (ref 6.5–8.1)

## 2023-03-01 LAB — CBC
HCT: 27.7 % — ABNORMAL LOW (ref 39.0–52.0)
Hemoglobin: 8.9 g/dL — ABNORMAL LOW (ref 13.0–17.0)
MCH: 29.7 pg (ref 26.0–34.0)
MCHC: 32.1 g/dL (ref 30.0–36.0)
MCV: 92.3 fL (ref 80.0–100.0)
Platelets: 167 10*3/uL (ref 150–400)
RBC: 3 MIL/uL — ABNORMAL LOW (ref 4.22–5.81)
RDW: 14.2 % (ref 11.5–15.5)
WBC: 12.7 10*3/uL — ABNORMAL HIGH (ref 4.0–10.5)
nRBC: 0 % (ref 0.0–0.2)

## 2023-03-01 LAB — TYPE AND SCREEN
ABO/RH(D): O POS
Antibody Screen: NEGATIVE

## 2023-03-01 LAB — CBG MONITORING, ED: Glucose-Capillary: 150 mg/dL — ABNORMAL HIGH (ref 70–99)

## 2023-03-01 LAB — GLUCOSE, CAPILLARY
Glucose-Capillary: 152 mg/dL — ABNORMAL HIGH (ref 70–99)
Glucose-Capillary: 248 mg/dL — ABNORMAL HIGH (ref 70–99)

## 2023-03-01 LAB — PROTIME-INR
INR: 1.1 (ref 0.8–1.2)
Prothrombin Time: 14.5 s (ref 11.4–15.2)

## 2023-03-01 LAB — OCCULT BLOOD X 1 CARD TO LAB, STOOL: Fecal Occult Bld: POSITIVE — AB

## 2023-03-01 MED ORDER — MEMANTINE HCL 10 MG PO TABS
10.0000 mg | ORAL_TABLET | Freq: Two times a day (BID) | ORAL | Status: DC
  Administered 2023-03-01 – 2023-03-04 (×6): 10 mg via ORAL
  Filled 2023-03-01 (×6): qty 1

## 2023-03-01 MED ORDER — LEVOTHYROXINE SODIUM 25 MCG PO TABS
25.0000 ug | ORAL_TABLET | Freq: Every day | ORAL | Status: DC
  Administered 2023-03-02 – 2023-03-04 (×3): 25 ug via ORAL
  Filled 2023-03-01 (×3): qty 1

## 2023-03-01 MED ORDER — ONDANSETRON HCL 4 MG PO TABS: 4.0000 mg | ORAL_TABLET | Freq: Four times a day (QID) | ORAL | Status: DC | PRN

## 2023-03-01 MED ORDER — ROSUVASTATIN CALCIUM 20 MG PO TABS
40.0000 mg | ORAL_TABLET | Freq: Every day | ORAL | Status: DC
  Administered 2023-03-02 – 2023-03-04 (×3): 40 mg via ORAL
  Filled 2023-03-01 (×2): qty 2
  Filled 2023-03-01: qty 4

## 2023-03-01 MED ORDER — PANTOPRAZOLE SODIUM 40 MG IV SOLR
40.0000 mg | INTRAVENOUS | Status: AC
  Administered 2023-03-01: 40 mg via INTRAVENOUS
  Filled 2023-03-01: qty 10

## 2023-03-01 MED ORDER — ALBUTEROL SULFATE (2.5 MG/3ML) 0.083% IN NEBU: 2.5000 mg | INHALATION_SOLUTION | RESPIRATORY_TRACT | Status: DC | PRN

## 2023-03-01 MED ORDER — PANTOPRAZOLE SODIUM 40 MG IV SOLR: 40.0000 mg | Freq: Two times a day (BID) | INTRAVENOUS | Status: DC

## 2023-03-01 MED ORDER — ACETAMINOPHEN 650 MG RE SUPP: 650.0000 mg | Freq: Four times a day (QID) | RECTAL | Status: DC | PRN

## 2023-03-01 MED ORDER — PANTOPRAZOLE INFUSION (NEW) - SIMPLE MED
8.0000 mg/h | INTRAVENOUS | Status: DC
  Administered 2023-03-01 – 2023-03-04 (×7): 8 mg/h via INTRAVENOUS
  Filled 2023-03-01: qty 100
  Filled 2023-03-01: qty 80
  Filled 2023-03-01: qty 100
  Filled 2023-03-01: qty 80
  Filled 2023-03-01: qty 100
  Filled 2023-03-01 (×3): qty 80

## 2023-03-01 MED ORDER — ONDANSETRON HCL 4 MG/2ML IJ SOLN: 4.0000 mg | Freq: Four times a day (QID) | INTRAMUSCULAR | Status: DC | PRN

## 2023-03-01 MED ORDER — INSULIN GLARGINE-YFGN 100 UNIT/ML ~~LOC~~ SOLN
9.0000 [IU] | Freq: Every day | SUBCUTANEOUS | Status: DC
  Administered 2023-03-01 – 2023-03-03 (×3): 9 [IU] via SUBCUTANEOUS
  Filled 2023-03-01 (×4): qty 0.09

## 2023-03-01 MED ORDER — INSULIN ASPART 100 UNIT/ML IJ SOLN
0.0000 [IU] | INTRAMUSCULAR | Status: DC
  Administered 2023-03-02: 3 [IU] via SUBCUTANEOUS
  Administered 2023-03-02: 2 [IU] via SUBCUTANEOUS
  Administered 2023-03-02 – 2023-03-03 (×5): 3 [IU] via SUBCUTANEOUS
  Administered 2023-03-03: 2 [IU] via SUBCUTANEOUS
  Administered 2023-03-03: 3 [IU] via SUBCUTANEOUS
  Administered 2023-03-04: 5 [IU] via SUBCUTANEOUS
  Administered 2023-03-04: 3 [IU] via SUBCUTANEOUS
  Administered 2023-03-04: 2 [IU] via SUBCUTANEOUS

## 2023-03-01 MED ORDER — ACETAMINOPHEN 325 MG PO TABS
650.0000 mg | ORAL_TABLET | Freq: Four times a day (QID) | ORAL | Status: DC | PRN
  Administered 2023-03-03 – 2023-03-04 (×2): 650 mg via ORAL
  Filled 2023-03-01 (×2): qty 2

## 2023-03-01 MED ORDER — TRAZODONE HCL 50 MG PO TABS
25.0000 mg | ORAL_TABLET | Freq: Every evening | ORAL | Status: DC | PRN
  Filled 2023-03-01: qty 1

## 2023-03-01 MED ORDER — METOPROLOL SUCCINATE ER 25 MG PO TB24
25.0000 mg | ORAL_TABLET | Freq: Every day | ORAL | Status: DC
  Administered 2023-03-02 – 2023-03-04 (×3): 25 mg via ORAL
  Filled 2023-03-01 (×3): qty 1

## 2023-03-01 NOTE — ED Notes (Signed)
Called report to [place] at [facility]  for room [phone]

## 2023-03-01 NOTE — ED Triage Notes (Signed)
Pt states that he has been having abd pain with black stools since he was seen at [facility] 2 days ago, reports that BP have been low at home in the 80's, reports that he had a fall yesterday. Pt wears 2L all the time.

## 2023-03-01 NOTE — H&P (Signed)
History and Physical  [name]:[id number] DOB: 01-11-1932 DOA: 03/01/2023  PCP: Mila Palmer, MD   Chief Complaint: Abdominal discomfort, dark stools  HPI: [name] is a 89 y.o. male with medical history significant for combined systolic and diastolic heart failure, carotid stenosis, CAD, CKD 4, insulin-dependent type 2 diabetes on aspirin and Plavix being admitted to the hospital with acute blood loss anemia and concern for upper GI bleed.  He developed some vague abdominal pain a few days ago and went to the emergency department, he had right upper quadrant ultrasound which showed some gallstones but no acute cholecystitis and he was discharged home.  Says that when he was in the ER the other day, he had a normal bowel movement.  However since that time, he has been leaking black appearing stool from the rectum.  He takes aspirin and Plavix, took it this morning.  Denies any NSAID use.  No nausea or vomiting, just vague abdominal discomfort that is intermittent and unrelated to meals.  She presented back to the emergency department at [facility] this morning.  Vital signs there were unremarkable, though he did have a couple of low blood pressures.  Lab work showed WBC 13, hemoglobin 9 down from baseline of about 12.  Renal function is stable, BUN is higher than his baseline.  He was given IV Protonix 80 mg, ER provider discussed with gastroenterologist Dr. Marca Ancona who recommended hospitalist admission to [facility].  Review of Systems: Please see HPI for pertinent positives and negatives. A complete 10 system review of systems are otherwise negative.  Past Medical History:  Diagnosis Date   Abnormal Doppler ultrasound of carotid artery 12/16/2012   mildly abnormal doppler . no prior studies to compare to. follow up studies are recommended when clinically indicated.   Acute respiratory failure with hypoxia 11/2018   Blepharitis    CAD (coronary artery disease) 07/24/2013    Previously placed drug-eluting stents in the LAD and RCA in 2005, low risk perfusion study December 2011 in January 2014   Cardiomyopathy, ischemic 07/24/2013   EF 45-50% echo March 2014   Carotid stenosis, left 11/2018   on left 60%   Cerebrovascular disease 03/24/2022   CHB (complete heart block) 08/27/2019   Chronic combined systolic and diastolic heart failure 02/19/2015   CKD (chronic kidney disease) stage 3, GFR 30-59 ml/min 07/24/2013   Diabetic hypoglycemia 05/02/2021   Diabetic polyneuropathy 05/02/2021   Gastric ulcer    GI bleed    History of cancer    History of echocardiogram 08/15/2012   EF  45-50 % very difficult study, severe LVH, pacemaker, trace TR,mildly enlarged RV,; aorta mildly calcified   History of stress test 06/06/2012   low risk scan. fixed inferior defect consistant with prior infarction with moderatly reduced EF. no appreciable evidence of ischemia. 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 Jude Accent DDDR ; this is the third generator this pt. has had   Pain due to onychomycosis of toenails of both feet 11/13/2018   Pain in joint of right shoulder 09/15/2021   Second degree AV block 03/09/2014   Sensorineural hearing loss (SNHL) of both ears 04/08/2018   Shingles    Sinus node  dysfunction 03/09/2014   TIA (transient ischemic attack) 11/2018   vs stroke not seen on CT   Tinnitus, bilateral 04/08/2018   Type II diabetes mellitus    Past Surgical History:  Procedure Laterality Date   CARDIAC CATHETERIZATION  12-29-2003   rt. coronary angiography was done with a 6 french 4 cm taper coronary cath. This demonstrated  approximately 70 to 75% mildly segmental stenosis in the mid RCA before the acute  margin           CARDIAC CATHETERIZATION  11-19-2003   diagnostic cath,, pt. to get second cath to have stent placed   cardiac stents     CATARACT EXTRACTION     KNEE SURGERY Right    PPM GENERATOR CHANGEOUT N/A 04/07/2019   Procedure: PPM GENERATOR CHANGEOUT;  Surgeon: Thurmon Fair, MD;  Location: [facility] INVASIVE CV LAB;  Service: Cardiovascular;  Laterality: N/A;   PROSTATE BIOPSY     prostate seed implants     SPINE SURGERY     WISDOM TOOTH EXTRACTION      Social History:  reports that he has never smoked. He has quit using smokeless tobacco. He reports current alcohol use. He reports that he does not use drugs.   No Known Allergies  Family History  Problem Relation Age of Onset   Memory loss Mother        with very advanced age   Heart disease Father      Prior to Admission medications   Medication Sig Start Date End Date Taking? Authorizing Provider  acetaminophen (TYLENOL) 500 MG tablet Take 500 mg by mouth 2 (two) times daily as needed for moderate pain.   Yes [provider]  aspirin 81 MG tablet Take 81 mg by mouth every evening.    Yes [provider]  calcitRIOL (ROCALTROL) 0.25 MCG capsule Take 0.25 mcg by mouth 2 (two) times a week. 02/05/23  Yes [provider]  clopidogrel (PLAVIX) 75 MG tablet Take 1 tablet by mouth once daily Patient taking differently: Take 75 mg by mouth every evening. 04/10/22  Yes Croitoru, Mihai, MD  Coenzyme Q10 (COQ10) 150 MG CAPS Take 150 mg by mouth every evening.   Yes [provider]  Continuous Glucose Sensor (FREESTYLE LIBRE 3 SENSOR) MISC Apply topically.   Yes [provider]  docusate sodium (COLACE) 100 MG capsule Take 100 mg by mouth daily.   Yes [provider]  levothyroxine (SYNTHROID, LEVOTHROID) 25 MCG tablet Take 25 mcg by mouth every morning. 02/09/14  Yes [provider]  memantine (NAMENDA) 10 MG tablet Take 1 tablet (10 mg total) by mouth 2 (two) times daily.  10/19/22  Yes Marcos Eke, PA-C  metoprolol succinate (TOPROL XL) 25 MG 24 hr tablet Take 1 tablet (25 mg total) by mouth daily. 02/20/23  Yes Croitoru, Mihai, MD  Multiple Vitamin (RENAL MULTIVITAMIN/ZINC PO) Take 1 tablet by mouth every evening.   Yes [provider]  nitroGLYCERIN (NITROSTAT) 0.4 MG SL tablet DISSOLVE ONE TABLET UNDER THE TONGUE EVERY 5 MINUTES AS NEEDED FOR CHEST PAIN. 03/13/22  Yes Croitoru, Mihai, MD  NOVOLIN 70/30 KWIKPEN (70-30) 100 UNIT/ML KwikPen Inject 18-55 Units into the skin 2 (two) times daily. Takes 55 units in the morning and 18 units at night. 04/25/22  Yes [provider]  NOVOLIN R FLEXPEN RELION 100 UNIT/ML FlexPen Inject 14 Units as directed every evening. 06/27/22  Yes [provider]  OVER THE COUNTER MEDICATION Take 2 tablets by  mouth in the morning. Emma OTC medication   Yes [provider]  polyethylene glycol (MIRALAX / GLYCOLAX) 17 g packet Take 17 g by mouth daily. 08/25/21  Yes Arby Barrette, MD  rosuvastatin (CRESTOR) 40 MG tablet Take 1 tablet by mouth once daily 02/06/23  Yes Croitoru, Mihai, MD  bisacodyl (DULCOLAX) 5 MG EC tablet Take 5 mg by mouth daily as needed for severe constipation.    [provider]    Physical Exam: BP 106/68   Pulse 88   Temp (!) 97.5 F (36.4 C) (Oral)   Resp (!) 21   SpO2 100%   General:  Alert, oriented, calm, in no acute distress, he looks pale, but sitting up comfortably on the edge of the bed Eyes: EOMI, clear conjuctivae, white sclerea Neck: supple, no masses, trachea mildline  Cardiovascular: RRR, no murmurs or rubs, no peripheral edema  Respiratory: clear to auscultation bilaterally, no wheezes, no crackles  Abdomen: soft, nontender, distended, normal bowel tones heard  Skin: dry, no rashes  Musculoskeletal: no joint effusions, normal range of motion  Psychiatric: appropriate affect, normal speech  Neurologic: extraocular muscles intact, clear speech, moving  all extremities with intact sensorium         Labs on Admission:  Basic Metabolic Panel: Recent Labs  Lab 02/26/23 2343 03/01/23 0742  NA 138 140  K 5.6* 3.9  CL 102 105  CO2 19* 28  GLUCOSE 184* 157*  BUN 82* 98*  CREATININE 2.54* 2.67*  CALCIUM 8.8* 9.2   Liver Function Tests: Recent Labs  Lab 02/27/23 0853 03/01/23 0742  AST 16 15  ALT 15 11  ALKPHOS 37* 35*  BILITOT 0.8 0.4  PROT 6.2* 6.2*  ALBUMIN 3.2* 3.8   Recent Labs  Lab 02/27/23 0853  LIPASE 62*   No results for input(s): "AMMONIA" in the last 168 hours. CBC: Recent Labs  Lab 02/26/23 2358 03/01/23 0742  WBC 14.0* 12.7*  HGB 11.6* 8.9*  HCT 37.2* 27.7*  MCV 93.2 92.3  PLT 197 167   Cardiac Enzymes: No results for input(s): "CKTOTAL", "CKMB", "CKMBINDEX", "TROPONINI" in the last 168 hours.  BNP (last 3 results) Recent Labs    05/21/22 1448 05/27/22 2230  BNP 94.6 106.2*    ProBNP (last 3 results) No results for input(s): "PROBNP" in the last 8760 hours.  CBG: Recent Labs  Lab 02/27/23 0922 03/01/23 1241  GLUCAP 259* 150*    Radiological Exams on Admission: No results found.  Assessment/Plan [name] is a 89 y.o. male with medical history significant for combined systolic and diastolic heart failure, carotid stenosis, CAD, CKD 4, insulin-dependent type 2 diabetes on aspirin and Plavix being admitted to the hospital with acute blood loss anemia and concern for upper GI bleed.    Upper GI bleed-concern due to melena, abdominal discomfort, and following hemoglobin. -Inpatient admission to progressive -Cardiac telemetry -Avoid blood thinners, hold ASA/Plavix -IV PPI drip -Clear liquid diet, n.p.o. after midnight -Discussed with Dr. Marca Ancona, plan EGD in a.m.  Acute blood loss anemia-concern for upper GI bleed, treated as above -Follow every 8 hours hemoglobin -Transfuse as needed to keep hemoglobin greater than 7  CAD-holding ASA/Plavix/metoprolol due to GI bleed and low  blood pressure  Insulin-dependent type 2 diabetes-he is on 70/30 insulin at home -Low-dose Semglee nightly -Moderate dose sliding scale -Update hemoglobin A1c  Hypothyroidism-Synthroid  CKD stage IV-appears to be at baseline -Renally dose medications -Monitor renal function with daily labs  Hyperlipidemia-Crestor  Leukocytosis-no evidence  of acute infection, likely reactive due to upper GI bleeding  DVT prophylaxis: SCDs only    Code Status: Full Code  Consults called: [facility] Dr. Marca Ancona  Admission status: The appropriate patient status for this patient is INPATIENT. Inpatient status is judged to be reasonable and necessary in order to provide the required intensity of service to ensure the patient's safety. The patient's presenting symptoms, physical exam findings, and initial radiographic and laboratory data in the context of their chronic comorbidities is felt to place them at high risk for further clinical deterioration. Furthermore, it is not anticipated that the patient will be medically stable for discharge from the hospital within 2 midnights of admission.    I certify that at the point of admission it is my clinical judgment that the patient will require inpatient hospital care spanning beyond 2 midnights from the point of admission due to high intensity of service, high risk for further deterioration and high frequency of surveillance required  Time spent: 59 minutes  Dempsey Ahonen Sharlette Dense MD [facility] Pager [phone]  If 7PM-7AM, please contact night-coverage [email or web address] Password [email or web address]  03/01/2023, 2:46 PM

## 2023-03-01 NOTE — Consult Note (Signed)
[facility] Consult  Referring Provider: ER Primary Care Physician:  Mila Palmer, MD Primary Gastroenterologist: [facility](previous patient of Dr. Evette Cristal  Reason for Consultation: Anemia, liquid black stool  HPI: [name] is a 89 y.o. male states that he was in his usual state of health until a few days ago when he developed abdominal pain and was in the ER.  His complaint at that point was chest pain, constipation, underwent an ultrasound which showed gallstones without cholecystitis, the patient was discharged and advised to return if he had worsening symptoms.  Patient states that he went home and felt worse.  He developed fecal leakage of liquid black stools with ongoing abdominal discomfort and came to the ER for further evaluation. He takes Plavix at home along with aspirin 81 mg. He denies use of NSAIDs. Denies nausea, vomiting, unintentional weight loss, loss of appetite, acid reflux or heartburn. No prior EGD.  Colonoscopy, 2011, Dr. Evette Cristal, history of polyps: Normal, repeat not recommended due to age Outpatient labs from 01/01/2023 showed hemoglobin 14, BUN 46, creatinine 2.27, GFR 27.  He is a retired Technical sales engineer.  Past Medical History:  Diagnosis Date   Abnormal Doppler ultrasound of carotid artery 12/16/2012   mildly abnormal doppler . no prior studies to compare to. follow up studies are recommended when clinically indicated.   Acute respiratory failure with hypoxia 11/2018   Blepharitis    CAD (coronary artery disease) 07/24/2013   Previously placed drug-eluting stents in the LAD and RCA in 2005, low risk perfusion study December 2011 in January 2014   Cardiomyopathy, ischemic 07/24/2013   EF 45-50% echo March 2014   Carotid stenosis, left 11/2018   on left 60%   Cerebrovascular disease 03/24/2022   CHB (complete heart block) 08/27/2019   Chronic combined systolic and diastolic heart failure 02/19/2015   CKD (chronic kidney disease)  stage 3, GFR 30-59 ml/min 07/24/2013   Diabetic hypoglycemia 05/02/2021   Diabetic polyneuropathy 05/02/2021   Gastric ulcer    GI bleed    History of cancer    History of echocardiogram 08/15/2012   EF  45-50 % very difficult study, severe LVH, pacemaker, trace TR,mildly enlarged RV,; aorta mildly calcified   History of stress test 06/06/2012   low risk scan. fixed inferior defect consistant with prior infarction with moderatly reduced EF. no appreciable evidence of ischemia. 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 Accent DDDR ; this is the third generator this pt. has had   Pain due to onychomycosis of toenails of both feet 11/13/2018   Pain in joint of right shoulder 09/15/2021   Second degree AV block 03/09/2014   Sensorineural hearing loss (SNHL) of both ears 04/08/2018   Shingles    Sinus node dysfunction 03/09/2014   TIA (transient ischemic attack) 11/2018   vs stroke not seen on CT   Tinnitus, bilateral 04/08/2018   Type II diabetes mellitus     Past Surgical History:  Procedure Laterality Date   CARDIAC CATHETERIZATION  12-29-2003   rt. coronary angiography was done with a 6 french 4 cm taper coronary cath. This demonstrated  approximately 70 to 75% mildly segmental stenosis in the mid RCA before the acute margin  CARDIAC CATHETERIZATION  11-19-2003   diagnostic cath,, pt. to get second cath to have stent placed   cardiac stents     CATARACT EXTRACTION     KNEE SURGERY Right    PPM GENERATOR CHANGEOUT N/A 04/07/2019   Procedure: PPM GENERATOR CHANGEOUT;  Surgeon: Thurmon Fair, MD;  Location: [facility];  Service: Cardiovascular;  Laterality: N/A;   PROSTATE BIOPSY     prostate seed implants      SPINE SURGERY     WISDOM TOOTH EXTRACTION      Prior to Admission medications   Medication Sig Start Date End Date Taking? Authorizing Provider  acetaminophen (TYLENOL) 500 MG tablet Take 500 mg by mouth 2 (two) times daily as needed for moderate pain.   Yes [provider]  aspirin 81 MG tablet Take 81 mg by mouth every evening.    Yes [provider]  calcitRIOL (ROCALTROL) 0.25 MCG capsule Take 0.25 mcg by mouth 2 (two) times a week. 02/05/23  Yes [provider]  clopidogrel (PLAVIX) 75 MG tablet Take 1 tablet by mouth once daily Patient taking differently: Take 75 mg by mouth every evening. 04/10/22  Yes Croitoru, Mihai, MD  Coenzyme Q10 (COQ10) 150 MG CAPS Take 150 mg by mouth every evening.   Yes [provider]  Continuous Glucose Sensor (FREESTYLE LIBRE 3 SENSOR) MISC Apply topically.   Yes [provider]  docusate sodium (COLACE) 100 MG capsule Take 100 mg by mouth daily.   Yes [provider]  levothyroxine (SYNTHROID, LEVOTHROID) 25 MCG tablet Take 25 mcg by mouth every morning. 02/09/14  Yes [provider]  memantine (NAMENDA) 10 MG tablet Take 1 tablet (10 mg total) by mouth 2 (two) times daily. 10/19/22  Yes Marcos Eke, PA-C  metoprolol succinate (TOPROL XL) 25 MG 24 hr tablet Take 1 tablet (25 mg total) by mouth daily. 02/20/23  Yes Croitoru, Mihai, MD  Multiple Vitamin (RENAL MULTIVITAMIN/ZINC PO) Take 1 tablet by mouth every evening.   Yes [provider]  nitroGLYCERIN (NITROSTAT) 0.4 MG SL tablet DISSOLVE ONE TABLET UNDER THE TONGUE EVERY 5 MINUTES AS NEEDED FOR CHEST PAIN. 03/13/22  Yes Croitoru, Mihai, MD  NOVOLIN 70/30 KWIKPEN (70-30) 100 UNIT/ML KwikPen Inject 18-55 Units into the skin 2 (two) times daily. Takes 55 units in the morning and 18 units at night. 04/25/22  Yes [provider]  NOVOLIN R FLEXPEN RELION 100 UNIT/ML FlexPen Inject 14 Units as directed every evening. 06/27/22  Yes  [provider]  OVER THE COUNTER MEDICATION Take 2 tablets by mouth in the morning. Emma OTC medication   Yes [provider]  polyethylene glycol (MIRALAX / GLYCOLAX) 17 g packet Take 17 g by mouth daily. 08/25/21  Yes Arby Barrette, MD  rosuvastatin (CRESTOR) 40 MG tablet Take 1 tablet by mouth once daily 02/06/23  Yes Croitoru, Mihai, MD  bisacodyl (DULCOLAX) 5 MG EC tablet Take 5 mg by mouth daily as needed for severe constipation.    [provider]    Current Facility-Administered Medications  Medication Dose Route Frequency Provider Last Rate Last Admin   [START ON 03/05/2023] pantoprazole (PROTONIX) injection 40 mg  40 mg Intravenous Q12H Kirby Crigler, Mir M, MD       pantoprozole (PROTONIX) 80 mg /NS 100 mL infusion  8 mg/hr Intravenous Continuous Kirby Crigler, Mir M, MD        Allergies as of 03/01/2023   (No Known Allergies)    Family History  Problem Relation Age of Onset   Memory loss Mother        with very advanced age   Heart disease Father     Social History   Socioeconomic History   Marital status: Married    Spouse name: Not on file   Number of children: 2   Years of education: 28   Highest education level: Not on file  Occupational History   Occupation: Retired    Comment: Engineer, manufacturing  Tobacco Use   Smoking status: Never   Smokeless tobacco: Former  Building services engineer status: Never Used  Substance and Sexual Activity   Alcohol use: Yes    Comment: very rare   Drug use: No   Sexual activity: Not Currently    Partners: Female  Other Topics Concern   Not on file  Social History Narrative   Right handed      Lives with wife, son and daughter in law      Highest level of edu- doctorate   Drinks caffeine   3 story home   Social Determinants of Health   Financial Resource Strain: Not on file  Food Insecurity: No Food Insecurity (06/05/2022)   Hunger Vital Sign    Worried About Running Out of Food in the Last Year:  Never true    Ran Out of Food in the Last Year: Never true  Transportation Needs: No Transportation Needs (06/05/2022)   PRAPARE - Administrator, Civil Service (Medical): No    Lack of Transportation (Non-Medical): No  Physical Activity: Not on file  Stress: Not on file  Social Connections: Not on file  Intimate Partner Violence: Not At Risk (05/21/2022)   Humiliation, Afraid, Rape, and Kick questionnaire    Fear of Current or Ex-Partner: No    Emotionally Abused: No    Physically Abused: No    Sexually Abused: No    Review of Systems: As per HPI  Physical Exam: Vital signs in last 24 hours: Temp:  [97.5 F (36.4 C)-98.5 F (36.9 C)] 97.6 F (36.4 C) (10/10 1454) Pulse Rate:  [72-94] 83 (10/10 1454) Resp:  [13-25] 18 (10/10 1454) BP: (93-144)/(55-114) 122/69 (10/10 1454) SpO2:  [93 %-100 %] 100 % (10/10 1454)    General: Obese Head:  Normocephalic and atraumatic. Eyes:  Sclera clear, no icterus.  Mild pallor Ears:  Normal auditory acuity. Nose:  No deformity, discharge,  or lesions. Mouth:  No deformity or lesions.  Oropharynx pink & moist. Neck:  Supple; no masses or thyromegaly. Lungs: On oxygen via nasal cannula, not using accessory muscles of respiration  Heart:  Regular rate and rhythm; no murmurs, clicks, rubs,  or gallops. Extremities:  Without clubbing or edema. Neurologic:  Alert and  oriented x4;  grossly normal neurologically. Skin:  Intact without significant lesions or rashes. Psych:  Alert and cooperative. Normal mood and affect. Abdomen:  Soft, nontender and nondistended. No masses, hepatosplenomegaly or hernias noted. Normal bowel sounds, without guarding, and without rebound.         Lab Results: Recent Labs    02/26/23 2358 03/01/23 0742  WBC 14.0* 12.7*  HGB 11.6* 8.9*  HCT 37.2* 27.7*  PLT 197 167   BMET Recent Labs    02/26/23 2343 03/01/23 0742  NA 138 140  K 5.6* 3.9  CL 102 105  CO2 19* 28  GLUCOSE 184* 157*  BUN  82* 98*  CREATININE 2.54* 2.67*  CALCIUM 8.8* 9.2   LFT  Recent Labs    02/27/23 0853 03/01/23 0742  PROT 6.2* 6.2*  ALBUMIN 3.2* 3.8  AST 16 15  ALT 15 11  ALKPHOS 37* 35*  BILITOT 0.8 0.4  BILIDIR <0.1  --   IBILI NOT CALCULATED  --    PT/INR Recent Labs    03/01/23 0742  LABPROT 14.5  INR 1.1    Studies/Results: No results found.  Impression: Drop in hemoglobin from 12.2-8.9 On aspirin and Plavix at home Complains of fecal incontinence with liquid black stools Elevated BUN of 98 with creatinine of 2.67  Clinical picture compatible with upper GI bleed  Plan: Will start clear liquid diet and keep n.p.o. postmidnight. EGD with Dr. Ewing Schlein tomorrow afternoon. Patient has been given Protonix 40 mg IV x 2 doses and started on Protonix 8 mg/h IV.   LOS: 0 days   Kerin Salen, MD  03/01/2023, 2:59 PM

## 2023-03-01 NOTE — ED Notes (Signed)
Called lab about hemoccult card. Order corrected and will run.

## 2023-03-01 NOTE — ED Provider Notes (Signed)
[name] Provider Note   CSN: [id number] Arrival date & time: 03/01/23  1478     History  Chief Complaint  Patient presents with   GI Bleeding    [name] is a 89 y.o. male.  HPI   This patient is an 89 year old male, history of prior cerebrovascular disease on clopidogrel, he takes metoprolol and is an insulin requiring diabetic.  [facility] within the [facility] couple of days because of some abdominal discomfort and had an ultrasound showing gallstones, lab work had been reassuring except for a leukocytosis of 14,000 and a hemoglobin of 11.6, his prior hemoglobin was 12.2.  Platelets were normal.  Hepatic function at that time showed no elevation in bilirubin or liver function test.  Metabolic panel showed mild hyperkalemia at 5.6 with a creatinine of 2.5 which is close to the patient's baseline.  The patient had some abdominal pain associated with constipation had a bowel movement prior to coming back and when he had come back he was feeling better.  His abdominal exam was reportedly normal at that time.  The patient did okay yesterday however over the last 2 days the patient has had some liquid black stools and ongoing mild abdominal discomfort.  The family member at the bedside reports that there has been some transient blood pressures of 80/50 at home.  Of note he was 124/76 on arrival.  He denies chest pain or shortness of breath at this time.  Medication list reviewed, patient takes a baby aspirin, clopidogrel, metoprolol, insulin, rosuvastatin  Home Medications Prior to Admission medications   Medication Sig Start Date End Date Taking? Authorizing Provider  acetaminophen (TYLENOL) 500 MG tablet Take 500 mg by mouth 2 (two) times daily as needed for moderate pain.    [provider]  albuterol (PROVENTIL) 2 MG tablet Take 2 mg by mouth daily.    [provider]  aspirin 81 MG tablet Take 81 mg by  mouth every evening.     [provider]  bisacodyl (DULCOLAX) 5 MG EC tablet Take 5 mg by mouth daily as needed for severe constipation.    [provider]  calcitRIOL (ROCALTROL) 0.25 MCG capsule Take 0.25 mcg by mouth daily. 02/05/23   [provider]  clopidogrel (PLAVIX) 75 MG tablet Take 1 tablet by mouth once daily Patient taking differently: Take 75 mg by mouth every evening. 04/10/22   Croitoru, Mihai, MD  Coenzyme Q10 (COQ10) 150 MG CAPS Take 150 mg by mouth every evening.    [provider]  Continuous Glucose Sensor (FREESTYLE LIBRE 3 SENSOR) MISC Apply topically.    [provider]  docusate sodium (COLACE) 100 MG capsule Take 100 mg by mouth daily.    [provider]  levothyroxine (SYNTHROID, LEVOTHROID) 25 MCG tablet Take 25 mcg by mouth daily.  02/09/14   [provider]  memantine (NAMENDA) 10 MG tablet Take 1 tablet (10 mg total) by mouth 2 (two) times daily. 10/19/22   William Eke, PA-C  metoprolol succinate (TOPROL XL) 25 MG 24 hr tablet Take 1 tablet (25 mg total) by mouth daily. 02/20/23   Croitoru, Mihai, MD  Multiple Vitamin (RENAL MULTIVITAMIN/ZINC PO) Take 1 tablet by mouth every evening.    [provider]  nitroGLYCERIN (NITROSTAT) 0.4 MG SL tablet DISSOLVE ONE TABLET UNDER THE TONGUE EVERY 5 MINUTES AS NEEDED FOR CHEST PAIN. 03/13/22   Croitoru, Rachelle Hora, MD  [name]  70/30 KWIKPEN (70-30) 100 UNIT/ML KwikPen Inject 18-55 Units into the skin 2 (two) times daily. Takes 55 units in the morning and 18 units at night. 04/25/22   [provider]  NOVOLIN R FLEXPEN RELION 100 UNIT/ML FlexPen Inject 10 Units as directed every evening. 06/27/22   [provider]  OVER THE COUNTER MEDICATION Take 1 tablet by mouth in the morning and at bedtime. cocoavia    [provider]  OVER THE COUNTER MEDICATION Take 1 tablet by mouth in the morning and at bedtime. Emma OTC medication    [provider]  polyethylene glycol (MIRALAX / GLYCOLAX) 17 g packet Take 17 g by mouth daily. 08/25/21   Arby Barrette, MD  rosuvastatin (CRESTOR) 40 MG tablet Take 1 tablet by mouth once daily 02/06/23   Croitoru, Rachelle Hora, MD      Allergies    Patient has no known allergies.    Review of Systems   Review of Systems  All other systems reviewed and are negative.   Physical Exam Updated Vital Signs BP 138/65   Pulse 81   Temp 98.5 F (36.9 C) (Oral)   Resp (!) 25   SpO2 100%  Physical Exam Vitals and nursing note reviewed.  Constitutional:      General: He is not in acute distress.    Appearance: He is well-developed.  HENT:     Head: Normocephalic and atraumatic.     Mouth/Throat:     Pharynx: No oropharyngeal exudate.  Eyes:     General: No scleral icterus.       Right eye: No discharge.        Left eye: No discharge.     Conjunctiva/sclera: Conjunctivae normal.     Pupils: Pupils are equal, round, and reactive to light.  Neck:     Thyroid: No thyromegaly.     Vascular: No JVD.  Cardiovascular:     Rate and Rhythm: Normal rate and regular rhythm.     Heart sounds: Normal heart sounds. No murmur heard.    No friction rub. No gallop.  Pulmonary:     Effort: Pulmonary effort is normal. No respiratory distress.     Breath sounds: Normal breath sounds. No wheezing or rales.  Abdominal:     General: Bowel sounds are normal. There is no distension.     Palpations: Abdomen is soft. There is no mass.     Tenderness: There is abdominal tenderness.     Comments: Mild abdominal tenderness to palpation without guarding, no Murphy sign  Genitourinary:    Comments: Chaperone present for rectal exam, on exam patient has some liquid black stool, some formed soft black stool as well Musculoskeletal:        General: No tenderness. Normal range of motion.     Cervical back: Normal range of motion and neck supple.     Right lower leg: No edema.     Left lower leg: No edema.   Lymphadenopathy:     Cervical: No cervical adenopathy.  Skin:    General: Skin is warm and dry.     Findings: No erythema or rash.  Neurological:     Mental Status: He is alert.     Coordination: Coordination normal.  Psychiatric:        Behavior: Behavior normal.     ED Results / Procedures / Treatments   Labs (all labs ordered are listed, but only abnormal results are displayed) Labs Reviewed  COMPREHENSIVE METABOLIC PANEL -  Abnormal; Notable for the following components:      Result Value   Glucose, Bld 157 (*)    BUN 98 (*)    Creatinine, Ser 2.67 (*)    Total Protein 6.2 (*)    Alkaline Phosphatase 35 (*)    GFR, Estimated 22 (*)    All other components within normal limits  CBC - Abnormal; Notable for the following components:   WBC 12.7 (*)    RBC 3.00 (*)    Hemoglobin 8.9 (*)    HCT 27.7 (*)    All other components within normal limits  OCCULT BLOOD X 1 CARD TO LAB, STOOL - Abnormal; Notable for the following components:   Fecal Occult Bld POSITIVE (*)    All other components within normal limits  PROTIME-INR  POC OCCULT BLOOD, ED  TYPE AND SCREEN    EKG EKG Interpretation Date/Time:  Thursday March 01 2023 06:57:49 EDT Ventricular Rate:  89 PR Interval:  31 QRS Duration:  186 QT Interval:  441 QTC Calculation: 537 R Axis:   -60  Text Interpretation: VENTRICULAR PACED RHYTHM Short PR interval Nonspecific IVCD with LAD LVH with secondary repolarization abnormality Confirmed by Eber Hong ([id number]) on 03/01/2023 7:07:58 AM  Radiology US Abdomen Limited RUQ (LIVER/GB)  Result Date: 02/27/2023 CLINICAL DATA:  Pain EXAM: ULTRASOUND ABDOMEN LIMITED RIGHT UPPER QUADRANT COMPARISON:  CT 05/27/2022 FINDINGS: Gallbladder: Gallbladder is underdistended. There is some echogenic lobular areas along the margin consistent with some stones that are shadowing. No wall thickening or adjacent fluid. Common bile duct: Diameter: 4 mm Liver: No focal lesion identified.  Within normal limits in parenchymal echogenicity. Portal vein is patent on color Doppler imaging with normal direction of blood flow towards the liver. Other: Evaluation significantly limited by overlapping bowel gas and soft tissue. IMPRESSION: Limited examination. Gallbladder is mildly distended and has multiple stones. No wall thickening, reported sonographic Murphy's sign or ductal dilatation. Electronically Signed   By: Karen Kays M.D.   On: 02/27/2023 10:23    Procedures .Critical Care  Performed by: Eber Hong, MD Authorized by: Eber Hong, MD   Critical care provider statement:    Critical care time (minutes):  45   Critical care time was exclusive of:  Teaching time and separately billable procedures and treating other patients   Critical care was necessary to treat or prevent imminent or life-threatening deterioration of the following conditions: UGI bleed.   Critical care was time spent personally by me on the following activities:  Development of treatment plan with patient or surrogate, discussions with consultants, evaluation of patient's response to treatment, examination of patient, obtaining history from patient or surrogate, review of old charts, re-evaluation of patient's condition, pulse oximetry, ordering and review of radiographic studies, ordering and review of laboratory studies and ordering and performing treatments and interventions   I assumed direction of critical care for this patient from another provider in my specialty: no     Care discussed with: admitting provider   Comments:           Medications Ordered in ED Medications  pantoprazole (PROTONIX) injection 40 mg (has no administration in time range)  pantoprazole (PROTONIX) injection 40 mg (has no administration in time range)    ED Course/ Medical Decision Making/ A&P Clinical Course as of 03/01/23 0913  Thu Mar 01, 2023  0910 Dr. Erenest Blank with specialist at [facility] has accepted transfer of  the patient and will place in a bed request order [BM]  Clinical Course User Index [BM] Eber Hong, MD                                 Medical Decision Making Amount and/or Complexity of Data Reviewed Labs: ordered.  Risk Prescription drug management. Decision regarding hospitalization.    This patient presents to the ED for concern of liquid black stools, this involves an extensive number of treatment options, and is a complaint that carries with it a high risk of complications and morbidity.  The differential diagnosis includes rectal bleeding, could be related to laxatives, the patient denies a history of peptic ulcer disease for over 50 years since he was a young man, he does not take medications for stomach ulcers and review of the medication list shows no proton pump inhibitors   Co morbidities that complicate the patient evaluation  Obesity, pacemaker, anticoagulated   Additional history obtained:  Additional history obtained from medical record External records from outside source obtained and reviewed including recent pacemaker checks through cardiology The patient denies using any medications for stomach upset including Pepto-Bismol, he denies taking iron, I have gone over his medication list with him.  He does take Colace and then sometimes MiraLAX when he gets really constipated.  This is a chronic medication for him   Lab Tests:  I Ordered, and personally interpreted labs.  The pertinent results include: Hemoglobin is approximately 3 g lower than it had been previously, BUN is elevated over 90, creatinine of 2.67 which is close to baseline, was 2.4 in May however the BUN has doubled since May liver function unremarkable, platelets 167,000, INR 1.1    Cardiac Monitoring: / EKG:  The patient was maintained on a cardiac monitor.  I personally viewed and interpreted the cardiac monitored which showed an underlying rhythm of: Sinus rhythm   Consultations  Obtained:  I requested consultation with the gastroenterologist with [facility] physicians, Dr. Marca Ancona,  and discussed lab and imaging findings as well as pertinent plan - they recommend: Protonix and admit - they will see in consultation.   Problem List / ED Course / Critical interventions / Medication management  This patient continues to have some borderline blood pressures, thus far he is not tachycardic, his BUN and creatinine and hemoglobin are all suggestive of an upper GI bleed I ordered medication including Protonix for upper GI bleed, type and screen ordered Reevaluation of the patient after these medicines showed that the patient slightly improved I have reviewed the patients home medicines and have made adjustments as needed   Social Determinants of Health:  Critically ill with upper GI bleed   Test / Admission - Considered:  Will need to be admitted, GI request patient be transferred to [facility], awaiting to speak with hospitalist at that location         Final Clinical Impression(s) / ED Diagnoses Final diagnoses:  Upper GI bleeding    Rx / DC Orders ED Discharge Orders     None         Eber Hong, MD 03/01/23 [phone]

## 2023-03-01 NOTE — ED Notes (Signed)
Amanda arranged transport with Asher Muir at [facility] for Bed Ready at [facility] 4W RM# [id number](NS)

## 2023-03-02 ENCOUNTER — Encounter (HOSPITAL_COMMUNITY)
Admission: EM | Disposition: A | Discharge status: Home / Self Care | Payer: Self-pay | Source: Home / Self Care | Attending: Family Medicine

## 2023-03-02 ENCOUNTER — Encounter (HOSPITAL_COMMUNITY): Payer: Self-pay | Admitting: Internal Medicine

## 2023-03-02 ENCOUNTER — Inpatient Hospital Stay (HOSPITAL_COMMUNITY): Payer: Medicare Other

## 2023-03-02 DIAGNOSIS — I2510 Atherosclerotic heart disease of native coronary artery without angina pectoris: Secondary | ICD-10-CM

## 2023-03-02 DIAGNOSIS — D62 Acute posthemorrhagic anemia: Secondary | ICD-10-CM | POA: Diagnosis not present

## 2023-03-02 DIAGNOSIS — K259 Gastric ulcer, unspecified as acute or chronic, without hemorrhage or perforation: Secondary | ICD-10-CM | POA: Diagnosis not present

## 2023-03-02 DIAGNOSIS — Z794 Long term (current) use of insulin: Secondary | ICD-10-CM

## 2023-03-02 DIAGNOSIS — E119 Type 2 diabetes mellitus without complications: Secondary | ICD-10-CM | POA: Diagnosis not present

## 2023-03-02 DIAGNOSIS — K250 Acute gastric ulcer with hemorrhage: Secondary | ICD-10-CM | POA: Diagnosis not present

## 2023-03-02 DIAGNOSIS — N184 Chronic kidney disease, stage 4 (severe): Secondary | ICD-10-CM

## 2023-03-02 HISTORY — PX: ESOPHAGOGASTRODUODENOSCOPY (EGD) WITH PROPOFOL: SHX5813

## 2023-03-02 HISTORY — PX: BIOPSY: SHX5522

## 2023-03-02 LAB — CBC
HCT: 25.8 % — ABNORMAL LOW (ref 39.0–52.0)
Hemoglobin: 8 g/dL — ABNORMAL LOW (ref 13.0–17.0)
MCH: 30.3 pg (ref 26.0–34.0)
MCHC: 31 g/dL (ref 30.0–36.0)
MCV: 97.7 fL (ref 80.0–100.0)
Platelets: 145 10*3/uL — ABNORMAL LOW (ref 150–400)
RBC: 2.64 MIL/uL — ABNORMAL LOW (ref 4.22–5.81)
RDW: 14.1 % (ref 11.5–15.5)
WBC: 8.9 10*3/uL (ref 4.0–10.5)
nRBC: 0 % (ref 0.0–0.2)

## 2023-03-02 LAB — GLUCOSE, CAPILLARY
Glucose-Capillary: 102 mg/dL — ABNORMAL HIGH (ref 70–99)
Glucose-Capillary: 131 mg/dL — ABNORMAL HIGH (ref 70–99)
Glucose-Capillary: 135 mg/dL — ABNORMAL HIGH (ref 70–99)
Glucose-Capillary: 151 mg/dL — ABNORMAL HIGH (ref 70–99)
Glucose-Capillary: 165 mg/dL — ABNORMAL HIGH (ref 70–99)
Glucose-Capillary: 195 mg/dL — ABNORMAL HIGH (ref 70–99)

## 2023-03-02 LAB — BASIC METABOLIC PANEL
Anion gap: 8 (ref 5–15)
BUN: 79 mg/dL — ABNORMAL HIGH (ref 8–23)
CO2: 26 mmol/L (ref 22–32)
Calcium: 8.6 mg/dL — ABNORMAL LOW (ref 8.9–10.3)
Chloride: 106 mmol/L (ref 98–111)
Creatinine, Ser: 2.65 mg/dL — ABNORMAL HIGH (ref 0.61–1.24)
GFR, Estimated: 22 mL/min — ABNORMAL LOW (ref >60)
Glucose, Bld: 134 mg/dL — ABNORMAL HIGH (ref 70–99)
Potassium: 4 mmol/L (ref 3.5–5.1)
Sodium: 140 mmol/L (ref 135–145)

## 2023-03-02 LAB — HEMOGLOBIN AND HEMATOCRIT, BLOOD
HCT: 25.7 % — ABNORMAL LOW (ref 39.0–52.0)
Hemoglobin: 8.2 g/dL — ABNORMAL LOW (ref 13.0–17.0)

## 2023-03-02 LAB — HEMOGLOBIN A1C
Hgb A1c MFr Bld: 7.2 % — ABNORMAL HIGH (ref 4.8–5.6)
Mean Plasma Glucose: 159.94 mg/dL

## 2023-03-02 SURGERY — ESOPHAGOGASTRODUODENOSCOPY (EGD) WITH PROPOFOL
Anesthesia: Monitor Anesthesia Care

## 2023-03-02 MED ORDER — SODIUM CHLORIDE 0.9 % IV SOLN: INTRAVENOUS | Status: DC | PRN

## 2023-03-02 MED ORDER — LIDOCAINE 2% (20 MG/ML) 5 ML SYRINGE
INTRAMUSCULAR | Status: DC | PRN
Start: 2023-03-02 — End: 2023-03-02
  Administered 2023-03-02: 40 mg via INTRAVENOUS
  Administered 2023-03-02: 60 mg via INTRAVENOUS

## 2023-03-02 MED ORDER — PHENYLEPHRINE HCL (PRESSORS) 10 MG/ML IV SOLN
INTRAVENOUS | Status: DC | PRN
Start: 2023-03-02 — End: 2023-03-02
  Administered 2023-03-02: 40 ug via INTRAVENOUS

## 2023-03-02 MED ORDER — BISACODYL 10 MG RE SUPP
10.0000 mg | Freq: Once | RECTAL | Status: AC
  Administered 2023-03-02: 10 mg via RECTAL
  Filled 2023-03-02: qty 1

## 2023-03-02 MED ORDER — PROPOFOL 500 MG/50ML IV EMUL
INTRAVENOUS | Status: DC | PRN
  Administered 2023-03-02: 150 ug/kg/min via INTRAVENOUS
  Administered 2023-03-02: 20 mg via INTRAVENOUS

## 2023-03-02 SURGICAL SUPPLY — 15 items

## 2023-03-02 NOTE — Plan of Care (Signed)
  Problem: Activity: ?Goal: Risk for activity intolerance will decrease ?Outcome: Progressing ?  ?Problem: Pain Managment: ?Goal: General experience of comfort will improve ?Outcome: Progressing ?  ?Problem: Safety: ?Goal: Ability to remain free from injury will improve ?Outcome: Progressing ?  ?

## 2023-03-02 NOTE — Progress Notes (Signed)
Massie Bougie 1:15 PM  Subjective: Patient seen and examined and case discussed with my partner and his hospital computer chart as well as our office computer chart was reviewed and we reviewed his previous workup and history and case also discussed with his son and he has not had any further bleeding today and we had a very long conversation about his previous workup possibly a previous pacemaker problem and had a long talk about the risk benefits and methods of endoscopy and answered all of his and his son's questions  Objective: Vital signs stable afebrile no acute distress exam please see preassessment evaluation BUN decreased hemoglobin stable INR okay  Assessment: Medical problems on aspirin and Plavix  Plan: As above the risk benefits methods of endoscopy was thoroughly discussed and we will proceed today with anesthesia assistance with further workup and plans pending those findings  [facility] E  office [phone] After 5PM or if no answer call [phone]

## 2023-03-02 NOTE — Hospital Course (Addendum)
89 year old male PMH including combined systolic, diastolic CHF, CKD stage IV, presented with bleeding, admitted for further evaluation of suspected upper GI bleed.  Consultants GI  Procedures 10/11 EGD showed normal esophagus, nonbleeding nodular edematous gastric ulcer with no stigmata of bleeding.  Biopsied.

## 2023-03-02 NOTE — Anesthesia Postprocedure Evaluation (Signed)
Anesthesia Post Note  Patient: EYTHAN JAYNE  Procedure(s) Performed: ESOPHAGOGASTRODUODENOSCOPY (EGD) WITH PROPOFOL BIOPSY     Patient location during evaluation: PACU Anesthesia Type: MAC Level of consciousness: awake and alert Pain management: pain level controlled Vital Signs Assessment: post-procedure vital signs reviewed and stable Respiratory status: spontaneous breathing, nonlabored ventilation, respiratory function stable and patient connected to nasal cannula oxygen Cardiovascular status: stable and blood pressure returned to baseline Postop Assessment: no apparent nausea or vomiting Anesthetic complications: no   No notable events documented.  Last Vitals:  Vitals:   03/02/23 1450 03/02/23 1514  BP: (!) 108/53 (!) 131/57  Pulse: 79 66  Resp: (!) 21 14  Temp:  36.5 C  SpO2: 95% 99%    Last Pain:  Vitals:   03/02/23 1514  TempSrc: Oral  PainSc:                  [place]

## 2023-03-02 NOTE — Progress Notes (Signed)
Assumed care, Agree with previous RN assessment

## 2023-03-02 NOTE — Op Note (Signed)
[facility] Patient Name: [name] Procedure Date: 03/02/2023 MRN: [id number] Attending MD: Vida Rigger , MD, [id number] Date of Birth: 1932/04/24 CSN: [id number] Age: 89 Admit Type: Outpatient Procedure:                Upper GI endoscopy Indications:              Acute post hemorrhagic anemia, Melena Providers:                Vida Rigger, MD, Lorenza Evangelist, RN, Suzy Bouchard,                            RN, Geoffery Lyons, Technician, Harrington Challenger,                            Pensions consultant Referring MD:              Medicines:                Monitored Anesthesia Care Complications:            No immediate complications. Estimated Blood Loss:     Estimated blood loss: none. Procedure:                Pre-Anesthesia Assessment:                           - Prior to the procedure, a History and Physical                            was performed, and patient medications and                            allergies were reviewed. The patient's tolerance of                            previous anesthesia was also reviewed. The risks                            and benefits of the procedure and the sedation                            options and risks were discussed with the patient.                            All questions were answered, and informed consent                            was obtained. Prior Anticoagulants: The patient has                            taken Plavix (clopidogrel), last dose was 2 days                            prior to procedure. ASA Grade Assessment: III - A  patient with severe systemic disease. After                            reviewing the risks and benefits, the patient was                            deemed in satisfactory condition to undergo the                            procedure.                           After obtaining informed consent, the endoscope was                            passed under direct vision. Throughout  the                            procedure, the patient's blood pressure, pulse, and                            oxygen saturations were monitored continuously. The                            [id number]-H190 ([id number]) Olympus endoscope was introduced                            through the mouth, and advanced to the third part                            of duodenum. The upper GI endoscopy was                            accomplished without difficulty. The patient                            tolerated the procedure well. Scope In: Scope Out: Findings:      The larynx was normal.      The examined esophagus was normal.      One non-bleeding linear and superficial gastric ulcer with no stigmata       of bleeding but with some erythema and nodularity and edema was found in       the prepyloric region of the stomach. A few biopsies were taken with a       cold forceps for histology.      The ampulla, duodenal bulb, first portion of the duodenum, second       portion of the duodenum, major papilla, area of the papilla and third       portion of the duodenum were normal.      The exam was otherwise without abnormality. Impression:               - Normal larynx.                           - Normal esophagus.                           -  Non-bleeding nodular edematous gastric ulcer with                            no stigmata of bleeding. Biopsied.                           - Normal ampulla, duodenal bulb, first portion of                            the duodenum, second portion of the duodenum, major                            papilla, area of the papilla and third portion of                            the duodenum.                           - The examination was otherwise normal. Moderate Sedation:      Not Applicable - Patient had care per Anesthesia. Recommendation:           - Clear liquid diet today.                           - Continue present medications. Continue to hold                             aspirin and Plavix for now and will need to                            reevaluate his needs at home and will need pump                            inhibitors at home                           - Await pathology results.                           - Return to GI clinic PRN.                           - Telephone GI clinic for pathology results in 1                            week.                           - Telephone GI clinic if symptomatic PRN. Procedure Code(s):        --- Professional ---                           [phone], Esophagogastroduodenoscopy, flexible,                            transoral; with biopsy,  single or multiple Diagnosis Code(s):        --- Professional ---                           K25.9, Gastric ulcer, unspecified as acute or                            chronic, without hemorrhage or perforation                           D62, Acute posthemorrhagic anemia                           K92.1, Melena (includes Hematochezia) CPT copyright 2022 [employer] Association. All rights reserved. The codes documented in this report are preliminary and upon coder review may  be revised to meet current compliance requirements. Vida Rigger, MD 03/02/2023 2:31:58 PM This report has been signed electronically. Number of Addenda: 0

## 2023-03-02 NOTE — Transfer of Care (Signed)
Immediate Anesthesia Transfer of Care Note  Patient: KRISTAN VOTTA  Procedure(s) Performed: ESOPHAGOGASTRODUODENOSCOPY (EGD) WITH PROPOFOL BIOPSY  Patient Location: PACU  Anesthesia Type:MAC  Level of Consciousness: awake, alert , oriented, and patient cooperative  Airway & Oxygen Therapy: Patient Spontanous Breathing and Patient connected to nasal cannula oxygen  Post-op Assessment: Report given to RN and Post -op Vital signs reviewed and stable  Post vital signs: Reviewed and stable  Last Vitals:  Vitals Value Taken Time  BP 98/59 03/02/23 1427  Temp 36.4 C 03/02/23 1427  Pulse 71 03/02/23 1429  Resp 16 03/02/23 1429  SpO2 100 % 03/02/23 1429  Vitals shown include unfiled device data.  Last Pain:  Vitals:   03/02/23 1427  TempSrc: Temporal  PainSc: 0-No pain         Complications: No notable events documented.

## 2023-03-02 NOTE — Progress Notes (Signed)
Mobility Specialist - Progress Note  Pre-mobility: 76 bpm HR, 99% SpO2 During mobility: 89 bpm HR, 95% SpO2 Post-mobility: 82 bpm HR, 98% SPO2   03/02/23 1137  Oxygen Therapy  O2 Device Nasal Cannula  O2 Flow Rate (L/min) 3 L/min  Mobility  Activity Ambulated with assistance in hallway  Level of Assistance Contact guard assist, steadying assist  Assistive Device Front wheel walker  Distance Ambulated (ft) 100 ft  Range of Motion/Exercises Active  Activity Response Tolerated fair  Mobility Referral Yes  $Mobility charge 1 Mobility   Pt was found in bed and agreeable to ambulate. Pt stated not being able to ambulate far distances due to LE neuropathy. Pt able to ambulate ~63ft then took x1 seated rest break for ~27min. Stated feeling SOB, SPO2 maintained >90% throughout session. At EOS pt was left in bed with all needs met. Call bell in reach and son in room.  Billey Chang Mobility Specialist

## 2023-03-02 NOTE — Progress Notes (Signed)
Progress Note   Patient: [name]:[id number] DOB: 07-18-1931 DOA: 03/01/2023     1 DOS: the patient was seen and examined on 03/02/2023   Brief hospital course: 89 year old male PMH including combined systolic, diastolic CHF, CKD stage IV, presented with bleeding, admitted for further evaluation of suspected upper GI bleed.  [facility]ultants GI  Procedures 10/11 EGD showed normal esophagus, nonbleeding nodular edematous gastric ulcer with no stigmata of bleeding.  Biopsied.  Assessment and Plan: [name] is a 89 y.o. male with medical history significant for combined systolic and diastolic heart failure, carotid stenosis, CAD, CKD 4, insulin-dependent type 2 diabetes on aspirin and Plavix being admitted to the hospital with acute blood loss anemia and concern for upper GI bleed.     Upper GI bleed secondary to ulcer ABLA Complicated by ASA/Plavix Clear liquid diet, hold aspirin and Plavix per GI, continue PPI   CAD Asymptomatic.  Hold aspirin and Plavix for now.   Continue metoprolol.     Insulin-dependent type 2 diabetes Treated with 70/30 insulin at home. CBG stable.  Continue Semglee and sliding scale insulin.   Hypothyroidism Continue Synthroid   CKD stage IV Appears stable.   Hyperlipidemia-Crestor   Leukocytosis-resolved      Subjective:  Feels ok Reports constipation x6 days (chronic constipation) No bleeding in room  Physical Exam: Vitals:   03/02/23 1440 03/02/23 1447 03/02/23 1450 03/02/23 1514  BP: (!) 110/42  (!) 108/53 (!) 131/57  Pulse: 76 62 79 66  Resp: 19 15 (!) 21 14  Temp:    97.7 F (36.5 C)  TempSrc:    Oral  SpO2: 99% 92% 95% 99%  Weight:      Height:       Physical Exam Vitals reviewed.  Constitutional:      General: He is not in acute distress.    Appearance: He is not ill-appearing or toxic-appearing.  Cardiovascular:     Rate and Rhythm: Normal rate and regular rhythm.     Heart sounds: No murmur  heard. Pulmonary:     Effort: Pulmonary effort is normal. No respiratory distress.     Breath sounds: No wheezing, rhonchi or rales.  Abdominal:     Palpations: Abdomen is soft.  Neurological:     Mental Status: He is alert.  Psychiatric:        Mood and Affect: Mood normal.        Behavior: Behavior normal.     Data Reviewed: CBG stable Creatinine 2.65, baseline ~2-2.3 Hgb stable 8.2  Family Communication: son John at bedside  Disposition: Status is: Inpatient Remains inpatient appropriate because: GIB     Time spent: 35 minutes  Author: Brendia Sacks, MD 03/02/2023 6:06 PM  For on call review [email or web address].

## 2023-03-02 NOTE — Anesthesia Preprocedure Evaluation (Signed)
Anesthesia Evaluation  Patient identified by MRN, date of birth, ID band Patient awake    Reviewed: Allergy & Precautions, H&P , NPO status , Patient's Chart, lab work & pertinent test results  Airway Mallampati: II  TM Distance: >3 FB Neck ROM: Full    Dental no notable dental hx.    Pulmonary sleep apnea    Pulmonary exam normal breath sounds clear to auscultation       Cardiovascular + CAD and +CHF  Normal cardiovascular exam+ dysrhythmias + pacemaker  Rhythm:Regular Rate:Normal  Complete heart block s/p [employer] pacemaker.  Hx of reduced EF. Most recent echo from 2022 EF 40%   Neuro/Psych  PSYCHIATRIC DISORDERS     Dementia TIA   GI/Hepatic Neg liver ROS, PUD,,,Melena    Endo/Other  diabetesHypothyroidism    Renal/GU Renal disease  negative genitourinary   Musculoskeletal negative musculoskeletal ROS (+)    Abdominal   Peds negative pediatric ROS (+)  Hematology negative hematology ROS (+)   Anesthesia Other Findings   Reproductive/Obstetrics negative OB ROS                              Anesthesia Physical Anesthesia Plan  ASA: 3  Anesthesia Plan: MAC   Post-op Pain Management:    Induction: Intravenous  PONV Risk Score and Plan: Propofol infusion and Treatment may vary due to age or medical condition  Airway Management Planned: Natural Airway  Additional Equipment:   Intra-op Plan:   Post-operative Plan:   Informed Consent: I have reviewed the patients History and Physical, chart, labs and discussed the procedure including the risks, benefits and alternatives for the proposed anesthesia with the patient or authorized representative who has indicated his/her understanding and acceptance.     Dental advisory given  Plan Discussed with: CRNA  Anesthesia Plan Comments:          Anesthesia Quick Evaluation

## 2023-03-03 DIAGNOSIS — D62 Acute posthemorrhagic anemia: Secondary | ICD-10-CM | POA: Diagnosis not present

## 2023-03-03 DIAGNOSIS — E119 Type 2 diabetes mellitus without complications: Secondary | ICD-10-CM | POA: Diagnosis not present

## 2023-03-03 DIAGNOSIS — Z794 Long term (current) use of insulin: Secondary | ICD-10-CM | POA: Diagnosis not present

## 2023-03-03 LAB — CBC WITH DIFFERENTIAL/PLATELET
Abs Immature Granulocytes: 0.12 10*3/uL — ABNORMAL HIGH (ref 0.00–0.07)
Basophils Absolute: 0 10*3/uL (ref 0.0–0.1)
Basophils Relative: 0 %
Eosinophils Absolute: 0.2 10*3/uL (ref 0.0–0.5)
Eosinophils Relative: 2 %
HCT: 30.3 % — ABNORMAL LOW (ref 39.0–52.0)
Hemoglobin: 9.4 g/dL — ABNORMAL LOW (ref 13.0–17.0)
Immature Granulocytes: 1 %
Lymphocytes Relative: 19 %
Lymphs Abs: 1.8 10*3/uL (ref 0.7–4.0)
MCH: 30.2 pg (ref 26.0–34.0)
MCHC: 31 g/dL (ref 30.0–36.0)
MCV: 97.4 fL (ref 80.0–100.0)
Monocytes Absolute: 0.8 10*3/uL (ref 0.1–1.0)
Monocytes Relative: 8 %
Neutro Abs: 6.6 10*3/uL (ref 1.7–7.7)
Neutrophils Relative %: 70 %
Platelets: 163 10*3/uL (ref 150–400)
RBC: 3.11 MIL/uL — ABNORMAL LOW (ref 4.22–5.81)
RDW: 14.4 % (ref 11.5–15.5)
WBC: 9.5 10*3/uL (ref 4.0–10.5)
nRBC: 0 % (ref 0.0–0.2)

## 2023-03-03 LAB — BASIC METABOLIC PANEL
Anion gap: 9 (ref 5–15)
BUN: 62 mg/dL — ABNORMAL HIGH (ref 8–23)
CO2: 27 mmol/L (ref 22–32)
Calcium: 8.8 mg/dL — ABNORMAL LOW (ref 8.9–10.3)
Chloride: 104 mmol/L (ref 98–111)
Creatinine, Ser: 2.55 mg/dL — ABNORMAL HIGH (ref 0.61–1.24)
GFR, Estimated: 23 mL/min — ABNORMAL LOW (ref >60)
Glucose, Bld: 160 mg/dL — ABNORMAL HIGH (ref 70–99)
Potassium: 3.7 mmol/L (ref 3.5–5.1)
Sodium: 140 mmol/L (ref 135–145)

## 2023-03-03 LAB — GLUCOSE, CAPILLARY
Glucose-Capillary: 117 mg/dL — ABNORMAL HIGH (ref 70–99)
Glucose-Capillary: 121 mg/dL — ABNORMAL HIGH (ref 70–99)
Glucose-Capillary: 146 mg/dL — ABNORMAL HIGH (ref 70–99)
Glucose-Capillary: 156 mg/dL — ABNORMAL HIGH (ref 70–99)
Glucose-Capillary: 156 mg/dL — ABNORMAL HIGH (ref 70–99)
Glucose-Capillary: 172 mg/dL — ABNORMAL HIGH (ref 70–99)

## 2023-03-03 NOTE — Plan of Care (Signed)
  Problem: Respiratory: Goal: Will maintain a patent airway Outcome: Progressing   Problem: Clinical Measurements: Goal: Will remain free from infection Outcome: Progressing   Problem: Activity: Goal: Risk for activity intolerance will decrease Outcome: Progressing   Problem: Elimination: Goal: Will not experience complications related to bowel motility Outcome: Not Progressing   Problem: Bowel/Gastric: Goal: Will show no signs and symptoms of gastrointestinal bleeding Outcome: Progressing

## 2023-03-03 NOTE — Progress Notes (Addendum)
Massie Bougie 9:12 AM  Subjective: Patient seen and examined and case discussed with his son and he is still weak and unable to walk but had a small brown stool this morning and wants to eat and no other new complaints and we answered all of their questions  Objective: Vital signs stable afebrile no acute distress BUN and creatinine decreased hemoglobin okay  Assessment: Resolved GI bleeding and patient on Plavix  Plan: Avoid aspirin and nonsteroidals okay to resume Plavix in a few days continue pump inhibitors and consider either IV iron or erythropoietin to help raise his hemoglobin and we are happy to see back in the office and outpatient follow-up if needed please call me this weekend if I can be of any further assistance with this hospital stay and will follow-up the biopsies on the phone and okay to advance diet  [facility]  office [phone] After 5PM or if no answer call [phone]

## 2023-03-03 NOTE — Progress Notes (Signed)
Progress Note   Patient: [name] ZOX:[id number] DOB: [birth year] DOA: 03/01/2023     2 DOS: the patient was seen and examined on 03/03/2023   Brief hospital course: 89 year old male PMH including combined systolic, diastolic CHF, CKD stage IV, presented with bleeding, admitted for further evaluation of suspected upper GI bleed.  Consultants GI  Procedures 10/11 EGD showed normal esophagus, nonbleeding nodular edematous gastric ulcer with no stigmata of bleeding.  Biopsied.  Assessment and Plan: Upper GI bleed secondary to ulcer ABLA Complicated by ASA/Plavix EGD findings as above No further bleeding.  Diet advanced per gastroenterology. Coronary stents are remote.  History of multiple strokes several years ago.  Holding aspirin.  Resume Plavix in a few days per GI.  Continue PPI infusion, then transition to oral tomorrow. Home tomorrow if hemoglobin stable and no further bleeding.   CAD Asymptomatic.  Hold aspirin and Plavix for now.   Continue metoprolol.     Insulin-dependent type 2 diabetes Treated with 70/30 insulin at home. CBG stable.  Continue Semglee and sliding scale insulin.   Hypothyroidism Continue Synthroid   CKD stage IV Appears stable.   Hyperlipidemia-Crestor   Leukocytosis-resolved      Subjective:  Feels better Tolerating diet  Physical Exam: Vitals:   03/02/23 1514 03/02/23 2028 03/03/23 0422 03/03/23 1215  BP: (!) 131/57 128/77 (!) 115/57 (!) 117/98  Pulse: 66 69 70 85  Resp: 14 18 18 20   Temp: 97.7 F (36.5 C) 97.8 F (36.6 C) 97.7 F (36.5 C) (!) 97.4 F (36.3 C)  TempSrc: Oral Oral Oral Oral  SpO2: 99% 99% 100% 100%  Weight:      Height:       Physical Exam Vitals reviewed.  Constitutional:      General: He is not in acute distress.    Appearance: He is not ill-appearing or toxic-appearing.  Cardiovascular:     Rate and Rhythm: Normal rate and regular rhythm.     Heart sounds: No murmur heard. Pulmonary:      Effort: Pulmonary effort is normal. No respiratory distress.     Breath sounds: No wheezing, rhonchi or rales.  Neurological:     Mental Status: He is alert.  Psychiatric:        Mood and Affect: Mood normal.        Behavior: Behavior normal.     Data Reviewed: CBG stable Creatinine 2.55, stable Hgb stable 9.4  Family Communication: none  Disposition: Status is: Inpatient Remains inpatient appropriate because: GIB3     Time spent: 20 minutes  Author: Brendia Sacks, MD 03/03/2023 4:08 PM  For on call review [email or web address].

## 2023-03-04 DIAGNOSIS — K250 Acute gastric ulcer with hemorrhage: Secondary | ICD-10-CM | POA: Diagnosis not present

## 2023-03-04 DIAGNOSIS — D62 Acute posthemorrhagic anemia: Secondary | ICD-10-CM | POA: Diagnosis not present

## 2023-03-04 DIAGNOSIS — N184 Chronic kidney disease, stage 4 (severe): Secondary | ICD-10-CM | POA: Diagnosis not present

## 2023-03-04 LAB — CBC
HCT: 27.7 % — ABNORMAL LOW (ref 39.0–52.0)
Hemoglobin: 8.6 g/dL — ABNORMAL LOW (ref 13.0–17.0)
MCH: 30 pg (ref 26.0–34.0)
MCHC: 31 g/dL (ref 30.0–36.0)
MCV: 96.5 fL (ref 80.0–100.0)
Platelets: 164 10*3/uL (ref 150–400)
RBC: 2.87 MIL/uL — ABNORMAL LOW (ref 4.22–5.81)
RDW: 14.4 % (ref 11.5–15.5)
WBC: 11.1 10*3/uL — ABNORMAL HIGH (ref 4.0–10.5)
nRBC: 0 % (ref 0.0–0.2)

## 2023-03-04 LAB — GLUCOSE, CAPILLARY
Glucose-Capillary: 113 mg/dL — ABNORMAL HIGH (ref 70–99)
Glucose-Capillary: 156 mg/dL — ABNORMAL HIGH (ref 70–99)
Glucose-Capillary: 245 mg/dL — ABNORMAL HIGH (ref 70–99)

## 2023-03-04 MED ORDER — POLYETHYLENE GLYCOL 3350 17 G PO PACK
17.0000 g | PACK | Freq: Two times a day (BID) | ORAL | Status: DC
  Administered 2023-03-04: 17 g via ORAL
  Filled 2023-03-04: qty 1

## 2023-03-04 MED ORDER — CLOPIDOGREL BISULFATE 75 MG PO TABS: 75.0000 mg | ORAL_TABLET | Freq: Every day | ORAL | 3 refills | Status: DC

## 2023-03-04 MED ORDER — SENNA 8.6 MG PO TABS: 1.0000 | ORAL_TABLET | Freq: Every day | ORAL | Status: DC

## 2023-03-04 MED ORDER — PANTOPRAZOLE SODIUM 40 MG PO TBEC
40.0000 mg | DELAYED_RELEASE_TABLET | Freq: Every day | ORAL | Status: DC
  Administered 2023-03-04: 40 mg via ORAL
  Filled 2023-03-04: qty 1

## 2023-03-04 MED ORDER — IRON (FERROUS SULFATE) 325 (65 FE) MG PO TABS: 325.0000 mg | ORAL_TABLET | Freq: Every day | ORAL | Status: AC

## 2023-03-04 MED ORDER — INFLUENZA VAC A&B SURF ANT ADJ 0.5 ML IM SUSY
0.5000 mL | PREFILLED_SYRINGE | INTRAMUSCULAR | Status: AC
  Administered 2023-03-04: 0.5 mL via INTRAMUSCULAR
  Filled 2023-03-04: qty 0.5

## 2023-03-04 MED ORDER — PANTOPRAZOLE SODIUM 40 MG PO TBEC: 40.0000 mg | DELAYED_RELEASE_TABLET | Freq: Two times a day (BID) | ORAL | 2 refills | Status: DC

## 2023-03-04 NOTE — Discharge Summary (Signed)
Physician Discharge Summary   Patient: [name] MRN: [id number] DOB: May 25, 1931  Admit date:     03/01/2023  Discharge date: 03/04/23  Discharge Physician: Brendia Sacks   PCP: Mila Palmer, MD   Recommendations at discharge:   Upper GI bleed secondary to ulcer ABLA Holding aspirin per GI.  Resume Plavix in a few days per GI.  Continue PPI  Iron supplement Follow-up as an outpatient as needed Consider risk/benefit of aspirin resumption as an outpatient  Discharge Diagnoses: Principal Problem:   Upper GI bleed Active Problems:   CAD (coronary artery disease)   Type II diabetes mellitus   CKD (chronic kidney disease) stage 4, GFR 15-29 ml/min (HCC)   ABLA (acute blood loss anemia)   Gastric ulcer  Resolved Problems:   * No resolved hospital problems. *  Hospital Course: 89 year old man PMH including combined systolic, diastolic CHF, CKD stage IV, presented with bleeding, admitted for further evaluation of suspected upper GI bleed.  Was seen by gastroenterology and underwent EGD showing a nonbleeding ulcer.  Hemoglobin remained stable, did not have further bleeding, no blood products required.  Hospitalization uncomplicated.  Consultants GI  Procedures 10/11 EGD showed normal esophagus, nonbleeding nodular edematous gastric ulcer with no stigmata of bleeding.  Biopsied.  Upper GI bleed secondary to ulcer ABLA Complicated by ASA/Plavix EGD findings as above No further bleeding.  Diet advanced per gastroenterology. Coronary stents are remote.  History of multiple strokes several years ago.  Holding aspirin per GI.  Resume Plavix in a few days per GI.  Continue PPI  Iron supplement Follow-up as an outpatient as needed   CAD PMH stroke Asymptomatic.  Hold aspirin.  Resuming Plavix in 2 days. Continue metoprolol.     Insulin-dependent type 2 diabetes Treated with 70/30 insulin at home.   Hypothyroidism Continue Synthroid   CKD stage IV Appears stable.    Hyperlipidemia-Crestor   Leukocytosis-resolved  Disposition: Home Diet recommendation:  Cardiac and Carb modified diet DISCHARGE MEDICATION: Allergies as of 03/04/2023   No Known Allergies      Medication List     STOP taking these medications    aspirin 81 MG tablet   NovoLIN R FlexPen ReliOn 100 UNIT/ML FlexPen Generic drug: Insulin Regular Human       TAKE these medications    acetaminophen 500 MG tablet Commonly known as: TYLENOL Take 500 mg by mouth 2 (two) times daily as needed for moderate pain.   calcitRIOL 0.25 MCG capsule Commonly known as: ROCALTROL Take 0.25 mcg by mouth 2 (two) times a week.   clopidogrel 75 MG tablet Commonly known as: PLAVIX Take 1 tablet (75 mg total) by mouth daily. Restart 10/15 What changed: additional instructions   Colace 100 MG capsule Generic drug: docusate sodium Take 100 mg by mouth daily.   COQ10 150 MG Caps Take 150 mg by mouth every evening.   Dulcolax 5 MG EC tablet Generic drug: bisacodyl Take 5 mg by mouth daily as needed for severe constipation.   FreeStyle Libre 3 Sensor Misc Apply topically.   Iron (Ferrous Sulfate) 325 (65 Fe) MG Tabs Take 325 mg by mouth daily.   levothyroxine 25 MCG tablet Commonly known as: SYNTHROID Take 25 mcg by mouth every morning.   memantine 10 MG tablet Commonly known as: NAMENDA Take 1 tablet (10 mg total) by mouth 2 (two) times daily.   metoprolol succinate 25 MG 24 hr tablet Commonly known as: Toprol XL Take 1 tablet (25 mg total) by  mouth daily.   nitroGLYCERIN 0.4 MG SL tablet Commonly known as: NITROSTAT DISSOLVE ONE TABLET UNDER THE TONGUE EVERY 5 MINUTES AS NEEDED FOR CHEST PAIN.   NovoLIN 70/30 Kwikpen (70-30) 100 UNIT/ML KwikPen Generic drug: insulin isophane & regular human KwikPen Inject 18-55 Units into the skin 2 (two) times daily. Takes 55 units in the morning and 18 units at night.   OVER THE COUNTER MEDICATION Take 2 tablets by mouth in the  morning. Emma OTC medication   pantoprazole 40 MG tablet Commonly known as: PROTONIX Take 1 tablet (40 mg total) by mouth 2 (two) times daily.   polyethylene glycol 17 g packet Commonly known as: MIRALAX / GLYCOLAX Take 17 g by mouth daily.   RENAL MULTIVITAMIN/ZINC PO Take 1 tablet by mouth every evening.   rosuvastatin 40 MG tablet Commonly known as: CRESTOR Take 1 tablet by mouth once daily        Follow-up Information     Mila Palmer, MD. Schedule an appointment as soon as possible for a visit in 1 week(s).   Specialty: Family Medicine Contact information: [address] [place] [address] [phone]                No bleeding  Discharge Exam: Ceasar Mons Weights   03/02/23 1246  Weight: 107 kg   Physical Exam Vitals reviewed.  Constitutional:      General: He is not in acute distress.    Appearance: He is not ill-appearing or toxic-appearing.  Cardiovascular:     Rate and Rhythm: Normal rate and regular rhythm.     Heart sounds: No murmur heard. Pulmonary:     Effort: Pulmonary effort is normal. No respiratory distress.     Breath sounds: No wheezing, rhonchi or rales.  Abdominal:     Palpations: Abdomen is soft.  Neurological:     Mental Status: He is alert.  Psychiatric:        Mood and Affect: Mood normal.        Behavior: Behavior normal.     CBG stable  Condition at discharge: good  The results of significant diagnostics from this hospitalization (including imaging, microbiology, ancillary and laboratory) are listed below for reference.   Imaging Studies: US Abdomen Limited RUQ (LIVER/GB)  Result Date: 02/27/2023 CLINICAL DATA:  Pain EXAM: ULTRASOUND ABDOMEN LIMITED RIGHT UPPER QUADRANT COMPARISON:  CT 05/27/2022 FINDINGS: Gallbladder: Gallbladder is underdistended. There is some echogenic lobular areas along the margin consistent with some stones that are shadowing. No wall thickening or adjacent fluid. Common  bile duct: Diameter: 4 mm Liver: No focal lesion identified. Within normal limits in parenchymal echogenicity. Portal vein is patent on color Doppler imaging with normal direction of blood flow towards the liver. Other: Evaluation significantly limited by overlapping bowel gas and soft tissue. IMPRESSION: Limited examination. Gallbladder is mildly distended and has multiple stones. No wall thickening, reported sonographic Murphy's sign or ductal dilatation. Electronically Signed   By: Karen Kays M.D.   On: 02/27/2023 10:23   DG Abdomen 1 View  Result Date: 02/27/2023 CLINICAL DATA:  Constipation.  No pain. EXAM: ABDOMEN - 1 VIEW COMPARISON:  10/01/2021. FINDINGS: The bowel gas pattern is non-obstructive. Small-to-moderate stool burden. No evidence of pneumoperitoneum, within the limitations of a supine film. No acute osseous abnormalities. There are multiple subcentimeter sized calcifications overlying the right upper quadrant, likely related to cholelithiasis. Surgical changes, devices, tubes and lines: Prostatic radiation brachytherapy seeds noted overlying the pubic symphysis. IMPRESSION: 1.  Nonobstructive bowel gas pattern. Electronically Signed   By: Jules Schick M.D.   On: 02/27/2023 08:31   DG Chest 2 View  Result Date: 02/27/2023 CLINICAL DATA:  Chest tightness and shortness of breath EXAM: CHEST - 2 VIEW COMPARISON:  Radiographs 07/06/2022 FINDINGS: Stable cardiomegaly. Right chest wall pacemaker. Bibasilar atelectasis/scarring. Otherwise no focal consolidation. No pleural effusion or pneumothorax. No displaced rib fractures. IMPRESSION: No active cardiopulmonary disease.  Cardiomegaly. Electronically Signed   By: Minerva Fester M.D.   On: 02/27/2023 00:24    Microbiology: Results for orders placed or performed during the hospital encounter of 02/26/23  SARS Coronavirus 2 by RT PCR (hospital order, performed in [facility] hospital lab) *cepheid single result test* Anterior Nasal Swab      Status: None   Collection Time: 02/27/23  8:00 AM   Specimen: Anterior Nasal Swab  Result Value Ref Range Status   SARS Coronavirus 2 by RT PCR NEGATIVE NEGATIVE Final    Comment: Performed at [name], [address], [place], [place] [address]    Labs: CBC: Recent Labs  Lab 02/26/23 2358 03/01/23 0742 03/01/23 1514 03/01/23 2225 03/02/23 0400 03/02/23 0627 03/03/23 0349 03/04/23 0417  WBC 14.0* 12.7*  --   --  8.9  --  9.5 11.1*  NEUTROABS  --   --   --   --   --   --  6.6  --   HGB 11.6* 8.9*   < > 8.2* 8.0* 8.2* 9.4* 8.6*  HCT 37.2* 27.7*   < > 25.7* 25.8* 25.7* 30.3* 27.7*  MCV 93.2 92.3  --   --  97.7  --  97.4 96.5  PLT 197 167  --   --  145*  --  163 164   < > = values in this interval not displayed.   Basic Metabolic Panel: Recent Labs  Lab 02/26/23 2343 03/01/23 0742 03/02/23 0400 03/03/23 0349  NA 138 140 140 140  K 5.6* 3.9 4.0 3.7  CL 102 105 106 104  CO2 19* 28 26 27   GLUCOSE 184* 157* 134* 160*  BUN 82* 98* 79* 62*  CREATININE 2.54* 2.67* 2.65* 2.55*  CALCIUM 8.8* 9.2 8.6* 8.8*   Liver Function Tests: Recent Labs  Lab 02/27/23 0853 03/01/23 0742  AST 16 15  ALT 15 11  ALKPHOS 37* 35*  BILITOT 0.8 0.4  PROT 6.2* 6.2*  ALBUMIN 3.2* 3.8   CBG: Recent Labs  Lab 03/03/23 2028 03/03/23 2351 03/04/23 0407 03/04/23 0756 03/04/23 1147  GLUCAP 172* 121* 113* 156* 245*    Discharge time spent: less than 30 minutes.  Signed: Brendia Sacks, MD [facility] 03/04/2023

## 2023-03-05 LAB — SURGICAL PATHOLOGY

## 2023-03-06 ENCOUNTER — Encounter (HOSPITAL_COMMUNITY): Payer: Self-pay | Admitting: Gastroenterology

## 2023-03-07 ENCOUNTER — Telehealth: Payer: Self-pay | Admitting: Cardiovascular Disease

## 2023-03-07 DIAGNOSIS — Z9981 Dependence on supplemental oxygen: Secondary | ICD-10-CM | POA: Diagnosis not present

## 2023-03-07 DIAGNOSIS — D62 Acute posthemorrhagic anemia: Secondary | ICD-10-CM | POA: Diagnosis not present

## 2023-03-07 DIAGNOSIS — F0390 Unspecified dementia without behavioral disturbance: Secondary | ICD-10-CM | POA: Diagnosis not present

## 2023-03-07 DIAGNOSIS — I2510 Atherosclerotic heart disease of native coronary artery without angina pectoris: Secondary | ICD-10-CM | POA: Diagnosis not present

## 2023-03-07 DIAGNOSIS — Z8673 Personal history of transient ischemic attack (TIA), and cerebral infarction without residual deficits: Secondary | ICD-10-CM | POA: Diagnosis not present

## 2023-03-07 DIAGNOSIS — D631 Anemia in chronic kidney disease: Secondary | ICD-10-CM | POA: Diagnosis not present

## 2023-03-07 DIAGNOSIS — K8020 Calculus of gallbladder without cholecystitis without obstruction: Secondary | ICD-10-CM | POA: Diagnosis not present

## 2023-03-07 DIAGNOSIS — I5042 Chronic combined systolic (congestive) and diastolic (congestive) heart failure: Secondary | ICD-10-CM | POA: Diagnosis not present

## 2023-03-07 DIAGNOSIS — Z955 Presence of coronary angioplasty implant and graft: Secondary | ICD-10-CM | POA: Diagnosis not present

## 2023-03-07 DIAGNOSIS — E785 Hyperlipidemia, unspecified: Secondary | ICD-10-CM | POA: Diagnosis not present

## 2023-03-07 DIAGNOSIS — Z794 Long term (current) use of insulin: Secondary | ICD-10-CM | POA: Diagnosis not present

## 2023-03-07 DIAGNOSIS — N184 Chronic kidney disease, stage 4 (severe): Secondary | ICD-10-CM | POA: Diagnosis not present

## 2023-03-07 DIAGNOSIS — Z9181 History of falling: Secondary | ICD-10-CM | POA: Diagnosis not present

## 2023-03-07 DIAGNOSIS — Z7902 Long term (current) use of antithrombotics/antiplatelets: Secondary | ICD-10-CM | POA: Diagnosis not present

## 2023-03-07 DIAGNOSIS — E039 Hypothyroidism, unspecified: Secondary | ICD-10-CM | POA: Diagnosis not present

## 2023-03-07 DIAGNOSIS — E1122 Type 2 diabetes mellitus with diabetic chronic kidney disease: Secondary | ICD-10-CM | POA: Diagnosis not present

## 2023-03-07 DIAGNOSIS — H9190 Unspecified hearing loss, unspecified ear: Secondary | ICD-10-CM | POA: Diagnosis not present

## 2023-03-07 DIAGNOSIS — K259 Gastric ulcer, unspecified as acute or chronic, without hemorrhage or perforation: Secondary | ICD-10-CM | POA: Diagnosis not present

## 2023-03-07 NOTE — Telephone Encounter (Signed)
Called spoke with Rosanne Ashing. Rosanne Ashing states pt has a near syncope 2 days ago and was lowed to a bed. Pt did not fall. Pt's daughter, Misty Stanley will have more details.  Called pt's daughter. Pt was faint multiple times a day. She would check his glucose or blood pressure when he was feeling faint.  Pt was taking off of Metoprolol at hospital. Pt has been off this medication about a week. Pt denied faintness while Rosanne Ashing was at the home. Pt's blood pressure was 112/60 earlier today. Misty Stanley states "I can tell he is feeling better." "Does he need to be seen?" Pt has a PCP appt 10/24.

## 2023-03-07 NOTE — Telephone Encounter (Signed)
Patient was in the hospital but was discharge home. Saw patient at home and had fluctuation of bp and the medication metoprolol was adjusted. Today was BP sitting down 110/60 and standing 100/60. Please advise

## 2023-03-12 NOTE — Telephone Encounter (Signed)
Called and set up an appt to see Azalee Course, PA on 03/14/23 at 10:55

## 2023-03-13 DIAGNOSIS — K259 Gastric ulcer, unspecified as acute or chronic, without hemorrhage or perforation: Secondary | ICD-10-CM | POA: Diagnosis not present

## 2023-03-13 DIAGNOSIS — I5042 Chronic combined systolic (congestive) and diastolic (congestive) heart failure: Secondary | ICD-10-CM | POA: Diagnosis not present

## 2023-03-13 DIAGNOSIS — N184 Chronic kidney disease, stage 4 (severe): Secondary | ICD-10-CM | POA: Diagnosis not present

## 2023-03-13 DIAGNOSIS — F0390 Unspecified dementia without behavioral disturbance: Secondary | ICD-10-CM | POA: Diagnosis not present

## 2023-03-13 DIAGNOSIS — D62 Acute posthemorrhagic anemia: Secondary | ICD-10-CM | POA: Diagnosis not present

## 2023-03-13 DIAGNOSIS — E1122 Type 2 diabetes mellitus with diabetic chronic kidney disease: Secondary | ICD-10-CM | POA: Diagnosis not present

## 2023-03-13 NOTE — Progress Notes (Unsigned)
Cardiology Office Note:    Date:  03/14/2023  ID:  [name], DOB 1932/03/22, MRN [id number] PCP: Mila Palmer, MD  [facility] Providers Cardiologist:  Thurmon Fair, MD       Patient Profile:      Chronic combined systolic and diastolic heart failure Sick sinus syndrome Complete heart block PPM CAD Type 2 diabetes Hypertension Mixed hyperlipidemia CKD stage IV OSA CVA      History of Present Illness:  Discussed the use of AI scribe software for clinical note transcription with the patient, who gave verbal consent to proceed.  [name] is a 89 y.o. male who presents for hospital follow-up regarding GI bleed with associated weakness and dizziness.  He has a history of dual-chamber permanent pacemaker, originally implanted for sinus bradycardia and intermittent second-degree AV block, now complete heart block.  Implanted in 1990 with generator changes in 2002, 2011, 2020.  History of CAD, with stage LAD and RCA revascularization with drug-eluting stents in 2005, has not required subsequent PCI.  He has a history of chronic combined systolic and diastolic heart failure, most recent echocardiogram on 05/02/2021 LVEF 40 to 45%.  History of carotid artery disease, 40 to 50% LICA stenosis.  He had a CVA 2020, at that time placed on Plavix.  History of OSA not currently on CPAP, type 2 diabetes, hyperlipidemia, hypertension, CKD stage IV, chronic respiratory failure and on home O2.    He was admitted on 1/24 due to acute hypoxic respiratory failure following a syncopal event.  He was noted to be hypotensive at the time with a creatinine of 3.25.  He was placed on low-dose midodrine and his metoprolol was discontinued.  At this time he was discharged on home oxygen.  His device check on 06/27/2022 showed complete heart block, pacemaker dependent, stable leads and battery status.Seen by Verdon Cummins, NP on 10/10/2022.  At the time he was doing well, his blood pressure was  well-controlled at 110/60.  His current medication regimen at the time was continued.  Last seen by Dr. Royann Shivers on 02/20/2023.  He was doing well from a cardiac standpoint at that time, was not spearing seeing any angina.  At that time they considered upgrade to a CRT/P device if heart failure worsens where EF was less than 40%.   He was admitted into the hospital on 03/01/2023 for acute blood loss anemia and concern for upper GI bleed.  Ultimately diagnosed with upper GI bleed.  He had a drop in hemoglobin from 12.2-8.9.  He had complaints of fecal incontinence and liquid black stools.  He was on aspirin and Plavix at home.  EGD was completed on 10/11 showing normal esophagus, nonbleeding nodular edematous gastric ulcer with no stigmata of bleeding.  Aspirin and Plavix were held per GI.  On discharge aspirin was held, Plavix was resumed with continued proton pump inhibitor.   Today:   He reports he is doing well overall and getting stronger every day since his discharge.  He is currently receiving home physical therapy, which she states has been extremely beneficial.  We went over his EGD and highlights of his admission.  He notes on the first day he was home from the hospital he did have 2 episodes of lightheadedness and dizziness while standing, he noted he felt quite unsteady on his feet.  During these episodes he was able to sit back down and his symptoms improved.  He has not had any other lightheadedness, dizziness, syncope up to  this point.  His current biggest complaint is that he feels like he needs to stay on his home oxygen.  He notes while while working with physical therapy at home he would drop into the low 80s without his oxygen and get short of breath.  When he sits down the shortness of breath resolves.  His blood pressure did not drop during these episodes and heart rate stayed stable.  He takes his SpO2 daily and his oxygen levels have maintained 99 to 100% on 2 L at all times.  He plans  to slowly wean himself off of oxygen per physical therapy's recommendations.  He does note that his weakness has drastically improved since the admission to the hospital.  He notes that he has his Alexa tell him to get up and walk every hour and he now has the energy to do so.  He does note ongoing black stools, however he did just start on daily iron supplement.  He tells me that he has not started his prescription for Protonix post discharge.  We alerted patient that it was ready to be picked up at his [facility] pharmacy and this medication is used to help heal his ulcer.  He did note minimal leg swelling right greater than left.  Will have them to monitor if leg swelling increases.  He has not had any extensive weight gain or weight loss since he has been home, will have him continue daily weights.  His blood pressure at home has ranged from the 120s to 130s systolic.  He denies chest pain, palpitations, hematuria, hemoptysis, diaphoresis, presyncope, syncope, orthopnea, and PND.         Review of Systems  Constitutional: Negative for weight gain and weight loss.  Cardiovascular:  Positive for dyspnea on exertion. Negative for chest pain, claudication, cyanosis, irregular heartbeat, leg swelling, near-syncope, orthopnea, palpitations, paroxysmal nocturnal dyspnea and syncope.  Respiratory:  Negative for cough, hemoptysis and shortness of breath.   Musculoskeletal:  Negative for falls.  Gastrointestinal:  Positive for melena. Negative for abdominal pain.  Genitourinary:  Negative for hematuria.  Neurological:  Positive for dizziness and light-headedness.     See HPI    Studies Reviewed:       Echocardiogram 05/02/2021 Sonographer Comments: Technically difficult study due to poor echo  windows, Technically challenging study due to limited acoustic windows,  suboptimal parasternal window, suboptimal apical window, suboptimal  subcostal window and patient is morbidly obese.   Image acquisition  challenging due to patient body habitus. Patient moving  throughout echo, wearing restrictive clothing under gown.  IMPRESSIONS    1. Left ventricular ejection fraction, by estimation, is 40 to 45%. The  left ventricle has mildly decreased function.   Conclusion(s)/Recommendation(s): Very difficult windows. Prior studies  have shown no great windows either. Grossly no significant changes in EF  compared to study 11/25/2018.  Risk Assessment/Calculations:            Physical Exam:   VS:  BP (!) 142/62 (BP Location: Left Arm, Patient Position: Sitting, Cuff Size: Normal)   Pulse 65   Ht 5\' 5"  (1.651 m)   Wt 239 lb (108.4 kg)   SpO2 95%   BMI 39.77 kg/m    Wt Readings from Last 3 Encounters:  03/14/23 239 lb (108.4 kg)  03/02/23 235 lb 14.3 oz (107 kg)  02/26/23 236 lb (107 kg)    Constitutional:      Appearance: Normal and healthy appearance.  Neck:  Vascular: JVD normal.  Pulmonary:     Effort: Pulmonary effort is normal.     Breath sounds: Normal breath sounds.  Chest:     Chest wall: Not tender to palpatation.  Cardiovascular:     PMI at left midclavicular line. Normal rate. Regular rhythm. Normal S1. Normal S2.      Murmurs: There is no murmur.     No gallop.  No click. No rub.  Pulses:    Intact distal pulses.  Edema:    Peripheral edema absent.  Musculoskeletal:     Cervical back: Neck supple. Skin:    General: Skin is warm.  Neurological:     General: No focal deficit present.     Mental Status: Oriented to person, place and time.  Psychiatric:        Behavior: Behavior is cooperative.        Assessment and Plan:      Upper gastrointestinal Bleed -Recent hospitalization for nonbleeding nodular edematous gastric ulcer with subsequent anemia.  He was started on iron supplementation. Stools remain dark, possibly due to iron supplementation. No current abdominal pain.  -Continue iron supplementation 325mg  daily. -Start Protonix 40mg  twice daily, they  have not started this since discharge -Repeat CBC today to assess hemoglobin level.  Can have PCP follow for your anemia -Continue off aspirin per GI recommendations  Congestive heart failure -Euvolemic and well compensated today -EF estimated to be 40 to 45% on most recent echo -Can continue 2 g sodium restriction  Shortness of Breath/Weakness/chronic respiratory failure with hypoxia -Improvement in strength and mobility since hospital discharge, but still requiring supplemental oxygen. Oxygen saturation generally 99% on 2L, but drops to 81% with exertion without oxygen. Hemoglobin previously low at 8.6, which may be contributing to symptoms. -No weight gain, cough, lower extremity edema, chest pain.  Lungs clear to auscultation bilaterally -Has been using oxygen since 05/2022.  -He does have untreated sleep apnea which can be contributory -Continue home physical therapy. -Continue supplemental oxygen, titrating as tolerated with physical therapy guidance. -Repeat blood work today to assess hemoglobin level.  Hypertension -Blood pressure slightly elevated today, but generally in 120s-130s at home. -Continue metoprolol 25 mg -Continue daily exercise/walks, continue heart healthy diet  Coronary artery disease -S/p DES stenting to proximal LAD and RCA, 2005 -Asymptomatic -No chest pain, he denies angina -Continue physical therapy daily -Continue Plavix 75 mg, taken off aspirin due to GI bleed admission per GI recommendations  CKD stage IV -GFR/creatinine has been stable  Hyperlipidemia -LDL 47 on 03/05/2022 -Stable, continue rosuvastatin 40 mg daily             Dispo:  Return in about 3 months (around 06/14/2023).  Signed, Denyce Robert, AGNP-C

## 2023-03-14 ENCOUNTER — Encounter: Payer: Self-pay | Admitting: Emergency Medicine

## 2023-03-14 ENCOUNTER — Ambulatory Visit: Payer: Medicare Other | Attending: Physician Assistant | Admitting: Emergency Medicine

## 2023-03-14 VITALS — BP 142/62 | HR 65 | Ht 65.0 in | Wt 239.0 lb

## 2023-03-14 DIAGNOSIS — I442 Atrioventricular block, complete: Secondary | ICD-10-CM | POA: Diagnosis not present

## 2023-03-14 DIAGNOSIS — N184 Chronic kidney disease, stage 4 (severe): Secondary | ICD-10-CM

## 2023-03-14 DIAGNOSIS — I5042 Chronic combined systolic (congestive) and diastolic (congestive) heart failure: Secondary | ICD-10-CM | POA: Diagnosis not present

## 2023-03-14 DIAGNOSIS — D649 Anemia, unspecified: Secondary | ICD-10-CM

## 2023-03-14 DIAGNOSIS — Z8673 Personal history of transient ischemic attack (TIA), and cerebral infarction without residual deficits: Secondary | ICD-10-CM

## 2023-03-14 DIAGNOSIS — I2510 Atherosclerotic heart disease of native coronary artery without angina pectoris: Secondary | ICD-10-CM | POA: Diagnosis not present

## 2023-03-14 DIAGNOSIS — K254 Chronic or unspecified gastric ulcer with hemorrhage: Secondary | ICD-10-CM | POA: Diagnosis not present

## 2023-03-14 DIAGNOSIS — I10 Essential (primary) hypertension: Secondary | ICD-10-CM | POA: Diagnosis not present

## 2023-03-14 DIAGNOSIS — Z794 Long term (current) use of insulin: Secondary | ICD-10-CM

## 2023-03-14 DIAGNOSIS — E1122 Type 2 diabetes mellitus with diabetic chronic kidney disease: Secondary | ICD-10-CM

## 2023-03-14 NOTE — Patient Instructions (Signed)
Medication Instructions:  NO CHANGES *If you need a refill on your cardiac medications before your next appointment, please call your pharmacy*   Lab Work: CBC TODAY If you have labs (blood work) drawn today and your tests are completely normal, you will receive your results only by: MyChart Message (if you have MyChart) OR A paper copy in the mail If you have any lab test that is abnormal or we need to change your treatment, we will call you to review the results.   Testing/Procedures: NO TESTING   Follow-Up: At [facility], you and your health needs are our priority.  As part of our continuing mission to provide you with exceptional heart care, we have created designated Provider Care Teams.  These Care Teams include your primary Cardiologist (physician) and Advanced Practice Providers (APPs -  Physician Assistants and Nurse Practitioners) who all work together to provide you with the care you need, when you need it.    Your next appointment:   3 month(s)  Provider:   Azalee Course, PA    Other Instructions FOLLOW UP ON LABS WITH PCP AT UPCOMING APPOINTMENT.

## 2023-03-15 DIAGNOSIS — R202 Paresthesia of skin: Secondary | ICD-10-CM | POA: Diagnosis not present

## 2023-03-15 DIAGNOSIS — E162 Hypoglycemia, unspecified: Secondary | ICD-10-CM | POA: Diagnosis not present

## 2023-03-15 DIAGNOSIS — E1140 Type 2 diabetes mellitus with diabetic neuropathy, unspecified: Secondary | ICD-10-CM | POA: Diagnosis not present

## 2023-03-15 DIAGNOSIS — K922 Gastrointestinal hemorrhage, unspecified: Secondary | ICD-10-CM | POA: Diagnosis not present

## 2023-03-15 DIAGNOSIS — D649 Anemia, unspecified: Secondary | ICD-10-CM | POA: Diagnosis not present

## 2023-03-15 LAB — CBC
Hematocrit: 30.1 % — ABNORMAL LOW (ref 37.5–51.0)
Hemoglobin: 9.5 g/dL — ABNORMAL LOW (ref 13.0–17.7)
MCH: 30 pg (ref 26.6–33.0)
MCHC: 31.6 g/dL (ref 31.5–35.7)
MCV: 95 fL (ref 79–97)
Platelets: 231 10*3/uL (ref 150–450)
RBC: 3.17 x10E6/uL — ABNORMAL LOW (ref 4.14–5.80)
RDW: 13.8 % (ref 11.6–15.4)
WBC: 8 10*3/uL (ref 3.4–10.8)

## 2023-03-16 NOTE — Progress Notes (Signed)
Hemoglobin slightly improved when compare to 2 weeks ago, however still anemic.

## 2023-03-19 ENCOUNTER — Other Ambulatory Visit: Payer: Self-pay | Admitting: Family Medicine

## 2023-03-19 DIAGNOSIS — R200 Anesthesia of skin: Secondary | ICD-10-CM

## 2023-03-21 ENCOUNTER — Telehealth: Payer: Self-pay | Admitting: Cardiovascular Disease

## 2023-03-21 DIAGNOSIS — K259 Gastric ulcer, unspecified as acute or chronic, without hemorrhage or perforation: Secondary | ICD-10-CM | POA: Diagnosis not present

## 2023-03-21 DIAGNOSIS — N184 Chronic kidney disease, stage 4 (severe): Secondary | ICD-10-CM | POA: Diagnosis not present

## 2023-03-21 DIAGNOSIS — E1122 Type 2 diabetes mellitus with diabetic chronic kidney disease: Secondary | ICD-10-CM | POA: Diagnosis not present

## 2023-03-21 DIAGNOSIS — F0390 Unspecified dementia without behavioral disturbance: Secondary | ICD-10-CM | POA: Diagnosis not present

## 2023-03-21 DIAGNOSIS — I5042 Chronic combined systolic (congestive) and diastolic (congestive) heart failure: Secondary | ICD-10-CM | POA: Diagnosis not present

## 2023-03-21 DIAGNOSIS — D62 Acute posthemorrhagic anemia: Secondary | ICD-10-CM | POA: Diagnosis not present

## 2023-03-21 NOTE — Telephone Encounter (Signed)
[name] is requesting a callback regarding pt's results and her wanting to know the Hemoglobin. Please advise

## 2023-03-21 NOTE — Telephone Encounter (Signed)
Spoke to patient and message relayed. No questions at this time.

## 2023-03-27 ENCOUNTER — Ambulatory Visit (INDEPENDENT_AMBULATORY_CARE_PROVIDER_SITE_OTHER): Payer: Medicare Other

## 2023-03-27 DIAGNOSIS — I495 Sick sinus syndrome: Secondary | ICD-10-CM

## 2023-03-27 DIAGNOSIS — I442 Atrioventricular block, complete: Secondary | ICD-10-CM | POA: Diagnosis not present

## 2023-03-27 LAB — CUP PACEART REMOTE DEVICE CHECK
Battery Remaining Longevity: 71 mo
Battery Remaining Percentage: 61 %
Battery Voltage: 2.99 V
Brady Statistic AP VP Percent: 15 %
Brady Statistic AP VS Percent: 1 %
Brady Statistic AS VP Percent: 85 %
Brady Statistic AS VS Percent: 1 %
Brady Statistic RA Percent Paced: 15 %
Brady Statistic RV Percent Paced: 99 %
Date Time Interrogation Session: 20241105030017
Implantable Lead Connection Status: 753985
Implantable Lead Connection Status: 753985
Implantable Lead Implant Date: 19901207
Implantable Lead Implant Date: 19901207
Implantable Lead Location: 753859
Implantable Lead Location: 753860
Implantable Pulse Generator Implant Date: 20201116
Lead Channel Impedance Value: 290 Ohm
Lead Channel Impedance Value: 510 Ohm
Lead Channel Pacing Threshold Amplitude: 0.5 V
Lead Channel Pacing Threshold Amplitude: 1 V
Lead Channel Pacing Threshold Pulse Width: 0.4 ms
Lead Channel Pacing Threshold Pulse Width: 0.4 ms
Lead Channel Sensing Intrinsic Amplitude: 3.8 mV
Lead Channel Sensing Intrinsic Amplitude: 5.9 mV
Lead Channel Setting Pacing Amplitude: 1.25 V
Lead Channel Setting Pacing Amplitude: 2 V
Lead Channel Setting Pacing Pulse Width: 0.4 ms
Lead Channel Setting Sensing Sensitivity: 3 mV
Pulse Gen Model: 2272
Pulse Gen Serial Number: 9177614

## 2023-03-28 DIAGNOSIS — F0390 Unspecified dementia without behavioral disturbance: Secondary | ICD-10-CM | POA: Diagnosis not present

## 2023-03-28 DIAGNOSIS — K259 Gastric ulcer, unspecified as acute or chronic, without hemorrhage or perforation: Secondary | ICD-10-CM | POA: Diagnosis not present

## 2023-03-28 DIAGNOSIS — E1122 Type 2 diabetes mellitus with diabetic chronic kidney disease: Secondary | ICD-10-CM | POA: Diagnosis not present

## 2023-03-28 DIAGNOSIS — D62 Acute posthemorrhagic anemia: Secondary | ICD-10-CM | POA: Diagnosis not present

## 2023-03-28 DIAGNOSIS — N184 Chronic kidney disease, stage 4 (severe): Secondary | ICD-10-CM | POA: Diagnosis not present

## 2023-03-28 DIAGNOSIS — I5042 Chronic combined systolic (congestive) and diastolic (congestive) heart failure: Secondary | ICD-10-CM | POA: Diagnosis not present

## 2023-04-04 DIAGNOSIS — D62 Acute posthemorrhagic anemia: Secondary | ICD-10-CM | POA: Diagnosis not present

## 2023-04-04 DIAGNOSIS — I5042 Chronic combined systolic (congestive) and diastolic (congestive) heart failure: Secondary | ICD-10-CM | POA: Diagnosis not present

## 2023-04-04 DIAGNOSIS — N184 Chronic kidney disease, stage 4 (severe): Secondary | ICD-10-CM | POA: Diagnosis not present

## 2023-04-04 DIAGNOSIS — E1122 Type 2 diabetes mellitus with diabetic chronic kidney disease: Secondary | ICD-10-CM | POA: Diagnosis not present

## 2023-04-04 DIAGNOSIS — F0390 Unspecified dementia without behavioral disturbance: Secondary | ICD-10-CM | POA: Diagnosis not present

## 2023-04-04 DIAGNOSIS — K259 Gastric ulcer, unspecified as acute or chronic, without hemorrhage or perforation: Secondary | ICD-10-CM | POA: Diagnosis not present

## 2023-04-06 DIAGNOSIS — N184 Chronic kidney disease, stage 4 (severe): Secondary | ICD-10-CM | POA: Diagnosis not present

## 2023-04-06 DIAGNOSIS — Z9981 Dependence on supplemental oxygen: Secondary | ICD-10-CM | POA: Diagnosis not present

## 2023-04-06 DIAGNOSIS — H9190 Unspecified hearing loss, unspecified ear: Secondary | ICD-10-CM | POA: Diagnosis not present

## 2023-04-06 DIAGNOSIS — Z7902 Long term (current) use of antithrombotics/antiplatelets: Secondary | ICD-10-CM | POA: Diagnosis not present

## 2023-04-06 DIAGNOSIS — E1122 Type 2 diabetes mellitus with diabetic chronic kidney disease: Secondary | ICD-10-CM | POA: Diagnosis not present

## 2023-04-06 DIAGNOSIS — Z8673 Personal history of transient ischemic attack (TIA), and cerebral infarction without residual deficits: Secondary | ICD-10-CM | POA: Diagnosis not present

## 2023-04-06 DIAGNOSIS — Z9181 History of falling: Secondary | ICD-10-CM | POA: Diagnosis not present

## 2023-04-06 DIAGNOSIS — D631 Anemia in chronic kidney disease: Secondary | ICD-10-CM | POA: Diagnosis not present

## 2023-04-06 DIAGNOSIS — Z955 Presence of coronary angioplasty implant and graft: Secondary | ICD-10-CM | POA: Diagnosis not present

## 2023-04-06 DIAGNOSIS — D62 Acute posthemorrhagic anemia: Secondary | ICD-10-CM | POA: Diagnosis not present

## 2023-04-06 DIAGNOSIS — I2510 Atherosclerotic heart disease of native coronary artery without angina pectoris: Secondary | ICD-10-CM | POA: Diagnosis not present

## 2023-04-06 DIAGNOSIS — I5042 Chronic combined systolic (congestive) and diastolic (congestive) heart failure: Secondary | ICD-10-CM | POA: Diagnosis not present

## 2023-04-06 DIAGNOSIS — K259 Gastric ulcer, unspecified as acute or chronic, without hemorrhage or perforation: Secondary | ICD-10-CM | POA: Diagnosis not present

## 2023-04-06 DIAGNOSIS — K8020 Calculus of gallbladder without cholecystitis without obstruction: Secondary | ICD-10-CM | POA: Diagnosis not present

## 2023-04-06 DIAGNOSIS — E039 Hypothyroidism, unspecified: Secondary | ICD-10-CM | POA: Diagnosis not present

## 2023-04-06 DIAGNOSIS — E785 Hyperlipidemia, unspecified: Secondary | ICD-10-CM | POA: Diagnosis not present

## 2023-04-06 DIAGNOSIS — F0390 Unspecified dementia without behavioral disturbance: Secondary | ICD-10-CM | POA: Diagnosis not present

## 2023-04-06 DIAGNOSIS — Z794 Long term (current) use of insulin: Secondary | ICD-10-CM | POA: Diagnosis not present

## 2023-04-11 DIAGNOSIS — K259 Gastric ulcer, unspecified as acute or chronic, without hemorrhage or perforation: Secondary | ICD-10-CM | POA: Diagnosis not present

## 2023-04-11 DIAGNOSIS — F0390 Unspecified dementia without behavioral disturbance: Secondary | ICD-10-CM | POA: Diagnosis not present

## 2023-04-11 DIAGNOSIS — N184 Chronic kidney disease, stage 4 (severe): Secondary | ICD-10-CM | POA: Diagnosis not present

## 2023-04-11 DIAGNOSIS — E1122 Type 2 diabetes mellitus with diabetic chronic kidney disease: Secondary | ICD-10-CM | POA: Diagnosis not present

## 2023-04-11 DIAGNOSIS — D62 Acute posthemorrhagic anemia: Secondary | ICD-10-CM | POA: Diagnosis not present

## 2023-04-11 DIAGNOSIS — I5042 Chronic combined systolic (congestive) and diastolic (congestive) heart failure: Secondary | ICD-10-CM | POA: Diagnosis not present

## 2023-04-17 DIAGNOSIS — D62 Acute posthemorrhagic anemia: Secondary | ICD-10-CM | POA: Diagnosis not present

## 2023-04-17 DIAGNOSIS — I5042 Chronic combined systolic (congestive) and diastolic (congestive) heart failure: Secondary | ICD-10-CM | POA: Diagnosis not present

## 2023-04-17 DIAGNOSIS — F0390 Unspecified dementia without behavioral disturbance: Secondary | ICD-10-CM | POA: Diagnosis not present

## 2023-04-17 DIAGNOSIS — K259 Gastric ulcer, unspecified as acute or chronic, without hemorrhage or perforation: Secondary | ICD-10-CM | POA: Diagnosis not present

## 2023-04-17 DIAGNOSIS — E1122 Type 2 diabetes mellitus with diabetic chronic kidney disease: Secondary | ICD-10-CM | POA: Diagnosis not present

## 2023-04-17 DIAGNOSIS — N184 Chronic kidney disease, stage 4 (severe): Secondary | ICD-10-CM | POA: Diagnosis not present

## 2023-04-24 ENCOUNTER — Ambulatory Visit: Payer: Medicare Other | Admitting: Physician Assistant

## 2023-04-24 NOTE — Progress Notes (Signed)
Remote pacemaker transmission.   

## 2023-04-24 NOTE — Progress Notes (Incomplete)
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 Patient is currently on memantine 10 mg bid.  He is able to perform ADLs to his ability.  He no longer drives.  Mood is***    Follow up in   months. Continue memantine 10 mg twice daily, side effects discussed. Recommend using CPAP for OSA Recommend good control of her cardiovascular risk factors Continue to control mood as per PCP Recommend increasing socialization     Subjective:    This patient is accompanied in the office by his daughter-in-law*** who supplements the history.  Previous records as well as any outside records available were reviewed prior to todays visit. Patient was last seen on 10/19/22  with MMSE 28/30***   Any changes in memory since last visit? ".  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  He is also trying to recontact with his friends from [place]. Repeats oneself?  Endorsed Disoriented when walking into a room?  Patient denies ***  Leaving objects?  May misplace things but not in unusual places***  Wandering behavior?  denies   Any personality changes since last visit?  denies   Any worsening depression?:***. Hallucinations or paranoia?  Denies.   Seizures? denies    Any sleep changes?  He continues to sleep on his Lazy Boy.  Denies vivid dreams, REM behavior or sleepwalking   Sleep apnea?   Denies.   Any hygiene concerns? Denies..  Needs assistance due to right frozen shoulder causing  mobility difficulty Independent of bathing and dressing?  Endorsed  Does the patient needs help with medications?  Daughter-in-law and son in charge *** Who is in charge of the finances?  Son is in charge   *** Any changes in appetite?  denies ***   Patient have trouble swallowing? Denies.   Does the patient cook? No Any headaches?   denies   Chronic back pain  denies   Ambulates with difficulty?  He walks regularly throughout the day with the walker for stability.*** Recent falls or head injuries? denies     Unilateral weakness, numbness or tingling? denies   Any tremors?  Denies   Any anosmia?  Denies   Any incontinence of urine?  Endorsed***, wears diapers Any bowel dysfunction?   Denies      Patient lives with daughter-in-law and son*** Does the patient drive? No longer drives ***      Neurocognitive Testing 03/30/22 Dr. Milbert Coulter Briefly, results suggested an isolated impairment surrounding attention/concentration. Performance variability was exhibited across processing speed, semantic fluency, and confrontation naming. However, it should be highlighted that testing was completed in English rather than his native [place] and that this fact by itself could certainly account for weaker scores across expressive language tasks rather than neurological decline. Despite what can be found in previous medical records, memory testing is certainly not suggestive of Alzheimer's disease as all aspects of memory testing were appropriate when compared to age-matched peers. Regarding etiology, the most likely cause for ongoing dysfunction appears to be a combination  of cerebrovascular disease and other complex medical ailments. A previous head CT suggested "extensive" microvascular ischemic disease and he has numerous medical ailments in his medical history which can worsen cognitive functioning (e.g., cardiomyopathy, coronary artery disease, complete heart block, heart failure, hyperlipidemia, type II  diabetes, untreated obstructive sleep apnea). Testing patterns align with the expected impact of these ongoing ailments and neuroimaging findings well.     Initial visit 07/2021 The patient is seen in neurologic consultation at the request of Mila Palmer, MD for the evaluation of memory.  The patient is accompanied by his daughter-in-law who supplements the history.  His wife is on the phone to participate in supplement information. This is a 89 y.o. year old RH  male originally from [place] who initially reported having memory issues since December 2022, when he presented to the ED, with 4 day history of confusion.  Work-up was suspicious for hypoglycemia, versus TIA versus dementia.  After discharge, he was seen by his PCP, who referred him here for further evaluation.  After questioning, he reports that "the memory problems may have been longer than that, when I was in [place], I could not even remember [place] words which obviously I was fluent in ".  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  He used to exhibit his photos at [employer], and some of them are not familiar to him.  He finds himself repeating his questions and telling the same stories.  Sometimes he cannot remember long-term information, such as his sister's birthday.  He needs more time to think the answers than before.  He denies being disoriented when walking into a room.  He denies leaving objects in unusual places.  He ambulates with some difficulty, he has a right frozen shoulder, and has some balance issues.  He denies any head injuries or recent falls.  [name] does the driving, as the patient has "several small strokes and diabetes, and when the low sugar comes, he shakes and closes worry".  The patient lives with his wife and his daughter-in-law who noticed the same changes.  His mood is overall stable, but lately, he has  been more preoccupied, occasionally double and triple checking his wallet for example.  He denies depression, although he finds himself bored, seems he is computer has not been working for a few days, and "I am lost without it, waiting until he gets repaired ".  He does report some irritability due to the frustration of not being able to remember as before. He does not sleep well, or feels rested, because of his right frozen shoulder, which causes discomfort when he is moving in the bed.  He tried PT in the past, "it never worked ".  He denies vivid dreams, occasionally he may have a nightmare, denies sleepwalking, hallucinations or paranoia.  He denies any hygiene concerns, he needs help with bathing and dressing because of the shoulder.  He has been always meticulous about his medications, but his daughter-in-law helps him monitor her.  He is wife is an Airline pilot, and has always been in charge of the finances.  His appetite is somewhat decreased "he is less enthusiastic about food lately because of the situation".  He denies trouble swallowing.  He admits to not drinking enough water.  He does not cook.  He denies any significant headaches, occasionally he may have a migraine which resolves quickly.  He denies double vision, dizziness, focal numbness, or tingling.  He has a right frozen shoulder as mentioned before, denies tremors or anosmia.  No history of seizures.  Denies urine incontinence, retention, he does have issues with constipation, denies diarrhea.  20 years ago, he was diagnosed with OSA, but he never wanted to use his CPAP.  He denies alcohol or tobacco.  Family history of 1 aunt with Alzheimer's disease   Pertinent labs A1c 7.5 (high), LDL 77 TSH 2.91, vitamin B12 311   CT of the head without contrast 04/22/2021 No acute intracranial process.. 2. Atrophy with extensive chronic microvascular ischemic changes.3. Chronic right maxillary sinus disease. PREVIOUS MEDICATIONS:   CURRENT  MEDICATIONS:  Outpatient Encounter Medications as of 04/24/2023  Medication Sig   acetaminophen (TYLENOL) 500 MG tablet Take 500 mg by mouth 2 (two) times daily as needed for moderate pain.   bisacodyl (DULCOLAX) 5 MG EC tablet Take 5 mg by mouth daily as needed for severe constipation.   calcitRIOL (ROCALTROL) 0.25 MCG capsule Take 0.25 mcg by mouth 2 (two) times a week.   clopidogrel (PLAVIX) 75 MG tablet Take 1 tablet (75 mg total) by mouth daily. Restart 10/15   Coenzyme Q10 (COQ10) 150 MG CAPS Take 150 mg by mouth every evening.   Continuous Glucose Sensor (FREESTYLE LIBRE 3 SENSOR) MISC Apply topically.   docusate sodium (COLACE) 100 MG capsule Take 100 mg by mouth daily.   Iron, Ferrous Sulfate, 325 (65 Fe) MG TABS Take 325 mg by mouth daily.   levothyroxine (SYNTHROID, LEVOTHROID) 25 MCG tablet Take 25 mcg by mouth every morning.   memantine (NAMENDA) 10 MG tablet Take 1 tablet (10 mg total) by mouth 2 (two) times daily.   metoprolol succinate (TOPROL XL) 25 MG 24 hr tablet Take 1 tablet (25 mg total) by mouth daily. (Patient not taking: Reported on 03/14/2023)   Multiple Vitamin (RENAL MULTIVITAMIN/ZINC PO) Take 1 tablet by mouth every evening.   nitroGLYCERIN (NITROSTAT) 0.4 MG SL tablet DISSOLVE ONE TABLET UNDER THE TONGUE EVERY 5 MINUTES AS NEEDED FOR CHEST PAIN.   NOVOLIN 70/30 KWIKPEN (70-30) 100 UNIT/ML KwikPen Inject 18-55 Units into the skin 2 (two) times daily. Takes 55 units in the morning and 18 units at night.   OVER THE COUNTER MEDICATION Take 2 tablets by mouth in the morning. Emma OTC medication   pantoprazole (PROTONIX) 40 MG tablet Take 1 tablet (40 mg total) by mouth 2 (two) times daily.   polyethylene glycol (MIRALAX / GLYCOLAX) 17 g packet Take 17 g by mouth daily.   rosuvastatin (CRESTOR) 40 MG tablet Take 1 tablet by mouth once daily   Facility-Administered Encounter Medications as of 04/24/2023  Medication   betamethasone acetate-betamethasone sodium phosphate  (CELESTONE) injection 3 mg       10/19/2022    3:00 PM  MMSE - Mini Mental State Exam  Orientation to time 5  Orientation to Place 5  Registration 3  Attention/ Calculation 5  Recall 2  Language- name 2 objects 2  Language- repeat 1  Language- follow 3 step command 3  Language- read & follow direction 1  Write a sentence 0  Copy design 1  Total score 28      07/22/2021    7:00 AM  Montreal Cognitive Assessment   Visuospatial/ Executive (0/5) 4  Naming (0/3) 2  Attention: Read list of digits (0/2) 1  Attention: Read list of letters (0/1) 1  Attention: Serial 7 subtraction starting at 100 (  0/3) 2  Language: Repeat phrase (0/2) 2  Language : Fluency (0/1) 0  Abstraction (0/2) 2  Delayed Recall (0/5) 0  Orientation (0/6) 6  Total 20  Adjusted Score (based on education) 20    Objective:     PHYSICAL EXAMINATION:    VITALS:  There were no vitals filed for this visit.  GEN:  The patient appears stated age and is in NAD. HEENT:  Normocephalic, atraumatic.   Neurological examination:  General: NAD, well-groomed, appears stated age. Orientation: The patient is alert. Oriented to person, place and date Cranial nerves: There is good facial symmetry.The speech is fluent and clear. No aphasia or dysarthria. Fund of knowledge is appropriate. Recent and remote memory are impaired. Attention and concentration are reduced.  Able to name objects and repeat phrases.  Hearing is intact to conversational tone. *** Sensation: Sensation is intact to light touch throughout Motor: Strength is at least antigravity x4. DTR's 2/4 in UE/LE     Movement examination: Tone: There is normal tone in the UE/LE Abnormal movements:  no tremor.  No myoclonus.  No asterixis.   Coordination:  There is no decremation with RAM's. Normal finger to nose  Gait and Station: The patient has no*** difficulty arising out of a deep-seated chair without the use of the hands. Needs walker to ambulate.The  patient's stride length is good.  Gait is cautious and narrow.    Thank you for allowing [place] the opportunity to participate in the care of this nice patient. Please do not hesitate to contact us for any questions or concerns.   Total time spent on today's visit was *** minutes dedicated to this patient today, preparing to see patient, examining the patient, ordering tests and/or medications and counseling the patient, documenting clinical information in the EHR or other health record, independently interpreting results and communicating results to the patient/family, discussing treatment and goals, answering patient's questions and coordinating care.  Cc:  Mila Palmer, MD  Marlowe Kays 04/24/2023 6:11 AM

## 2023-04-25 DIAGNOSIS — N184 Chronic kidney disease, stage 4 (severe): Secondary | ICD-10-CM | POA: Diagnosis not present

## 2023-04-25 DIAGNOSIS — D62 Acute posthemorrhagic anemia: Secondary | ICD-10-CM | POA: Diagnosis not present

## 2023-04-25 DIAGNOSIS — K259 Gastric ulcer, unspecified as acute or chronic, without hemorrhage or perforation: Secondary | ICD-10-CM | POA: Diagnosis not present

## 2023-04-25 DIAGNOSIS — F0390 Unspecified dementia without behavioral disturbance: Secondary | ICD-10-CM | POA: Diagnosis not present

## 2023-04-25 DIAGNOSIS — I5042 Chronic combined systolic (congestive) and diastolic (congestive) heart failure: Secondary | ICD-10-CM | POA: Diagnosis not present

## 2023-04-25 DIAGNOSIS — E1122 Type 2 diabetes mellitus with diabetic chronic kidney disease: Secondary | ICD-10-CM | POA: Diagnosis not present

## 2023-05-02 DIAGNOSIS — K259 Gastric ulcer, unspecified as acute or chronic, without hemorrhage or perforation: Secondary | ICD-10-CM | POA: Diagnosis not present

## 2023-05-02 DIAGNOSIS — F0390 Unspecified dementia without behavioral disturbance: Secondary | ICD-10-CM | POA: Diagnosis not present

## 2023-05-02 DIAGNOSIS — D62 Acute posthemorrhagic anemia: Secondary | ICD-10-CM | POA: Diagnosis not present

## 2023-05-02 DIAGNOSIS — N184 Chronic kidney disease, stage 4 (severe): Secondary | ICD-10-CM | POA: Diagnosis not present

## 2023-05-02 DIAGNOSIS — I5042 Chronic combined systolic (congestive) and diastolic (congestive) heart failure: Secondary | ICD-10-CM | POA: Diagnosis not present

## 2023-05-02 DIAGNOSIS — E1122 Type 2 diabetes mellitus with diabetic chronic kidney disease: Secondary | ICD-10-CM | POA: Diagnosis not present

## 2023-05-09 ENCOUNTER — Ambulatory Visit (INDEPENDENT_AMBULATORY_CARE_PROVIDER_SITE_OTHER): Payer: Medicare Other | Admitting: Physician Assistant

## 2023-05-09 ENCOUNTER — Encounter: Payer: Self-pay | Admitting: Physician Assistant

## 2023-05-09 VITALS — BP 121/60 | HR 84 | Ht 67.0 in | Wt 229.0 lb

## 2023-05-09 DIAGNOSIS — F01A0 Vascular dementia, mild, without behavioral disturbance, psychotic disturbance, mood disturbance, and anxiety: Secondary | ICD-10-CM

## 2023-05-09 NOTE — Patient Instructions (Addendum)
It was a pleasure to see you today at our office.   Recommendations:  Follow up 08/27/2023 at 11:30  Continue Memantine 10 mg daily.     Whom to call:  Memory  decline, memory medications: Call our office [phone]   For psychiatric meds, mood meds: Please have your primary care physician manage these medications.    For assessment of decision of mental capacity and competency:  Call Dr. Erick Blinks, geriatric psychiatrist at [phone]  For guidance in geriatric dementia issues please call [facility] [phone]     If you have any severe symptoms of a stroke, or other severe issues such as confusion,severe chills or fever, etc call 911 or go to the ER as you may need to be evaluated further       RECOMMENDATIONS FOR ALL PATIENTS WITH MEMORY PROBLEMS: 1. Continue to exercise (Recommend 30 minutes of walking everyday, or 3 hours every week) 2. Increase social interactions - continue going to [place] and enjoy social gatherings with friends and family 3. Eat healthy, avoid fried foods and eat more fruits and vegetables 4. Maintain adequate blood pressure, blood sugar, and blood cholesterol level. Reducing the risk of stroke and cardiovascular disease also helps promoting better memory. 5. Avoid stressful situations. Live a simple life and avoid aggravations. Organize your time and prepare for the next day in anticipation. 6. Sleep well, avoid any interruptions of sleep and avoid any distractions in the bedroom that may interfere with adequate sleep quality 7. Avoid sugar, avoid sweets as there is a strong link between excessive sugar intake, diabetes, and cognitive impairment We discussed the Mediterranean diet, which has been shown to help patients reduce the risk of progressive memory disorders and reduces cardiovascular risk. This includes eating fish, eat fruits and green leafy vegetables, nuts like almonds and hazelnuts, walnuts, and also use olive oil. Avoid  fast foods and fried foods as much as possible. Avoid sweets and sugar as sugar use has been linked to worsening of memory function.  There is always a concern of gradual progression of memory problems. If this is the case, then we may need to adjust level of care according to patient needs. Support, both to the patient and caregiver, should then be put into place.    The Alzheimer's Association is here all day, every day for people facing Alzheimer's disease through our free 24/7 Helpline: [phone]. The Helpline provides reliable information and support to all those who need assistance, such as individuals living with memory loss, Alzheimer's or other dementia, caregivers, health care professionals and the public.  Our highly trained and knowledgeable staff can help you with: Understanding memory loss, dementia and Alzheimer's  Medications and other treatment options  General information about aging and brain health  Skills to provide quality care and to find the best care from professionals  Legal, financial and living-arrangement decisions Our Helpline also features: Confidential care consultation provided by master's level clinicians who can help with decision-making support, crisis assistance and education on issues families face every day  Help in a caller's preferred language using our translation service that features more than 200 languages and dialects  Referrals to local community programs, services and ongoing support     FALL PRECAUTIONS: Be cautious when walking. Scan the area for obstacles that may increase the risk of trips and falls. When getting up in the mornings, sit up at the edge of the bed for a few minutes before getting out of bed. Consider  elevating the bed at the head end to avoid drop of blood pressure when getting up. Walk always in a well-lit room (use night lights in the walls). Avoid area rugs or power cords from appliances in the middle of the walkways. Use a  walker or a cane if necessary and consider physical therapy for balance exercise. Get your eyesight checked regularly.  FINANCIAL OVERSIGHT: Supervision, especially oversight when making financial decisions or transactions is also recommended.  HOME SAFETY: Consider the safety of the kitchen when operating appliances like stoves, microwave oven, and blender. Consider having supervision and share cooking responsibilities until no longer able to participate in those. Accidents with firearms and other hazards in the house should be identified and addressed as well.   ABILITY TO BE LEFT ALONE: If patient is unable to contact 911 operator, consider using LifeLine, or when the need is there, arrange for someone to stay with patients. Smoking is a fire hazard, consider supervision or cessation. Risk of wandering should be assessed by caregiver and if detected at any point, supervision and safe proof recommendations should be instituted.  MEDICATION SUPERVISION: Inability to self-administer medication needs to be constantly addressed. Implement a mechanism to ensure safe administration of the medications.        Mediterranean Diet A Mediterranean diet refers to food and lifestyle choices that are based on the traditions of countries located on the [employer] [place]. This way of eating has been shown to help prevent certain conditions and improve outcomes for people who have chronic diseases, like kidney disease and heart disease. What are tips for following this plan? Lifestyle  Cook and eat meals together with your family, when possible. Drink enough fluid to keep your urine clear or pale yellow. Be physically active every day. This includes: Aerobic exercise like running or swimming. Leisure activities like gardening, walking, or housework. Get 7-8 hours of sleep each night. If recommended by your health care provider, drink red wine in moderation. This means 1 glass a day for nonpregnant women  and 2 glasses a day for men. A glass of wine equals 5 oz (150 mL). Reading food labels  Check the serving size of packaged foods. For foods such as rice and pasta, the serving size refers to the amount of cooked product, not dry. Check the total fat in packaged foods. Avoid foods that have saturated fat or trans fats. Check the ingredients list for added sugars, such as corn syrup. Shopping  At the grocery store, buy most of your food from the areas near the walls of the store. This includes: Fresh fruits and vegetables (produce). Grains, beans, nuts, and seeds. Some of these may be available in unpackaged forms or large amounts (in bulk). Fresh seafood. Poultry and eggs. Low-fat dairy products. Buy whole ingredients instead of prepackaged foods. Buy fresh fruits and vegetables in-season from local farmers markets. Buy frozen fruits and vegetables in resealable bags. If you do not have access to quality fresh seafood, buy precooked frozen shrimp or canned fish, such as tuna, salmon, or sardines. Buy small amounts of raw or cooked vegetables, salads, or olives from the deli or salad bar at your store. Stock your pantry so you always have certain foods on hand, such as olive oil, canned tuna, canned tomatoes, rice, pasta, and beans. Cooking  Cook foods with extra-virgin olive oil instead of using butter or other vegetable oils. Have meat as a side dish, and have vegetables or grains as your main dish. This means having  meat in small portions or adding small amounts of meat to foods like pasta or stew. Use beans or vegetables instead of meat in common dishes like chili or lasagna. Experiment with different cooking methods. Try roasting or broiling vegetables instead of steaming or sauteing them. Add frozen vegetables to soups, stews, pasta, or rice. Add nuts or seeds for added healthy fat at each meal. You can add these to yogurt, salads, or vegetable dishes. Marinate fish or vegetables using  olive oil, lemon juice, garlic, and fresh herbs. Meal planning  Plan to eat 1 vegetarian meal one day each week. Try to work up to 2 vegetarian meals, if possible. Eat seafood 2 or more times a week. Have healthy snacks readily available, such as: Vegetable sticks with hummus. Greek yogurt. Fruit and nut trail mix. Eat balanced meals throughout the week. This includes: Fruit: 2-3 servings a day Vegetables: 4-5 servings a day Low-fat dairy: 2 servings a day Fish, poultry, or lean meat: 1 serving a day Beans and legumes: 2 or more servings a week Nuts and seeds: 1-2 servings a day Whole grains: 6-8 servings a day Extra-virgin olive oil: 3-4 servings a day Limit red meat and sweets to only a few servings a month What are my food choices? Mediterranean diet Recommended Grains: Whole-grain pasta. Brown rice. Bulgar wheat. Polenta. Couscous. Whole-wheat bread. Orpah Cobb. Vegetables: Artichokes. Beets. Broccoli. Cabbage. Carrots. Eggplant. Green beans. Chard. Kale. Spinach. Onions. Leeks. Peas. Squash. Tomatoes. Peppers. Radishes. Fruits: Apples. Apricots. Avocado. Berries. Bananas. Cherries. Dates. Figs. Grapes. Lemons. Melon. Oranges. Peaches. Plums. Pomegranate. Meats and other protein foods: Beans. Almonds. Sunflower seeds. Pine nuts. Peanuts. Cod. Salmon. Scallops. Shrimp. Tuna. Tilapia. Clams. Oysters. Eggs. Dairy: Low-fat milk. Cheese. Greek yogurt. Beverages: Water. Red wine. Herbal tea. Fats and oils: Extra virgin olive oil. Avocado oil. Grape seed oil. Sweets and desserts: [place] yogurt with honey. Baked apples. Poached pears. Trail mix. Seasoning and other foods: Basil. Cilantro. Coriander. Cumin. Mint. Parsley. Sage. Rosemary. Tarragon. Garlic. Oregano. Thyme. Pepper. Balsalmic vinegar. Tahini. Hummus. Tomato sauce. Olives. Mushrooms. Limit these Grains: Prepackaged pasta or rice dishes. Prepackaged cereal with added sugar. Vegetables: Deep fried potatoes (french  fries). Fruits: Fruit canned in syrup. Meats and other protein foods: Beef. Pork. Lamb. Poultry with skin. Hot dogs. Tomasa Blase. Dairy: Ice cream. Sour cream. Whole milk. Beverages: Juice. Sugar-sweetened soft drinks. Beer. Liquor and spirits. Fats and oils: Butter. Canola oil. Vegetable oil. Beef fat (tallow). Lard. Sweets and desserts: Cookies. Cakes. Pies. Candy. Seasoning and other foods: Mayonnaise. Premade sauces and marinades. The items listed may not be a complete list. Talk with your dietitian about what dietary choices are right for you. Summary The Mediterranean diet includes both food and lifestyle choices. Eat a variety of fresh fruits and vegetables, beans, nuts, seeds, and whole grains. Limit the amount of red meat and sweets that you eat. Talk with your health care provider about whether it is safe for you to drink red wine in moderation. This means 1 glass a day for nonpregnant women and 2 glasses a day for men. A glass of wine equals 5 oz (150 mL). This information is not intended to replace advice given to you by your health care provider. Make sure you discuss any questions you have with your health care provider. Document Released: 12/30/2015 Document Revised: 02/01/2016 Document Reviewed: 12/30/2015 Elsevier Interactive Patient Education  2017 [employer].

## 2023-05-09 NOTE — Progress Notes (Signed)
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 Patient is currently on memantine 10 mg bid. Memory is stable with MMSE today at 28/30. He needs assistance with ADLs due to decreased mobility.  He no longer drives.  Mood is good.     Follow up in 6  months. Continue memantine 10 mg twice daily, side effects discussed. Recommend good control of her cardiovascular risk factors Continue to control mood as per PCP       Subjective:    This patient is accompanied in the office by his daughter-in-law  who supplements the history.  Previous records as well as any outside records available were reviewed prior to todays visit. Patient was last seen on 10/19/22 with MMSE 28/30    Any changes in memory since last visit? "About the same".  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  He is also trying to recontact with his friends from [place] "but they are all leaving this [place] slowly". .  Repeats oneself?  Endorsed Disoriented when walking into a room?  Patient denies    Leaving objects?  May misplace things but not in unusual places   Wandering behavior?  denies   Any personality changes since last visit?  denies   Any worsening depression?: Denies. Hallucinations or paranoia?  Denies.   Seizures? denies    Any sleep changes?  He continues to sleep on his Lazy Boy.  Denies vivid dreams, REM behavior or sleepwalking   Sleep apnea? Endorsed, unable to use the CPAP Any hygiene  concerns? Denies.  Needs assistance due to right frozen shoulder causing mobility difficulty Independent of bathing and dressing? Needs assistance  Does the patient needs help with medications?  Daughter-in-law and son in charge   Who is in charge of the finances?  Son is in charge     Any changes in appetite?  denies    Patient have trouble swallowing? Denies.   Does the patient cook? No Any headaches?   denies   Chronic back pain  denies   Ambulates with difficulty?  He walks regularly throughout the day with the walker for stability. Recent falls or head injuries? Denies.     Unilateral weakness, numbness or tingling? He has neuropathy on his toes when putting his socks. .   Any tremors?  "Only when low in O2 below 80" Any anosmia?  Denies   Any incontinence of urine?  Endorsed, "he won't use pads or diapers" Any bowel dysfunction?   Denies, Uses Emma for the last 2 yeas, it helps       Patient lives with daughter-in-law and son  Does the patient drive? No longer drives        Neurocognitive Testing 03/30/22 Dr. Milbert Coulter Briefly, results suggested an isolated impairment surrounding attention/concentration. Performance variability was exhibited across processing speed, semantic fluency, and confrontation naming. However, it should be highlighted that testing was completed in English rather than his native [place] and that this fact by itself could certainly account for weaker scores  across expressive language tasks rather than neurological decline. Despite what can be found in previous medical records, memory testing is certainly not suggestive of Alzheimer's disease as all aspects of memory testing were appropriate when compared to age-matched peers. Regarding etiology, the most likely cause for ongoing dysfunction appears to be a combination of cerebrovascular disease and other complex medical ailments. A previous head CT suggested "extensive" microvascular ischemic disease and he has numerous  medical ailments in his medical history which can worsen cognitive functioning (e.g., cardiomyopathy, coronary artery disease, complete heart block, heart failure, hyperlipidemia, type II diabetes, untreated obstructive sleep apnea). Testing patterns align with the expected impact of these ongoing ailments and neuroimaging findings well.     Initial visit 07/2021 The patient is seen in neurologic consultation at the request of Mila Palmer, MD for the evaluation of memory.  The patient is accompanied by his daughter-in-law who supplements the history.  His wife is on the phone to participate in supplement information. This is a 89 y.o. year old RH  male originally from [place] who initially reported having memory issues since December 2022, when he presented to the ED, with 4 day history of confusion.  Work-up was suspicious for hypoglycemia, versus TIA versus dementia.  After discharge, he was seen by his PCP, who referred him here for further evaluation.  After questioning, he reports that "the memory problems may have been longer than that, when I was in [place], I could not even remember [place] words which obviously I was fluent in ".  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  He used to exhibit his photos at [employer], and some of them are not familiar to him.  He finds himself repeating his questions and telling the same stories.  Sometimes he cannot remember long-term information, such as his sister's birthday.  He needs more time to think the answers than before.  He denies being disoriented when walking into a room.  He denies leaving objects in unusual places.  He ambulates with some difficulty, he has a right frozen shoulder, and has some balance issues.  He denies any head injuries or recent falls.  [name] does the driving, as the patient has "several small strokes and diabetes, and  when the low sugar comes, he shakes and closes worry".  The patient lives with his wife and his daughter-in-law who noticed the same changes.  His mood is overall stable, but lately, he has been more preoccupied, occasionally double and triple checking his wallet for example.  He denies depression, although he finds himself bored, seems he is computer has not been working for a few days, and "I am lost without it, waiting until he gets repaired ".  He does report some irritability due to the frustration of not being able to remember as before. He does not sleep well, or feels rested, because of his right frozen shoulder, which causes discomfort when he is moving in the bed.  He tried PT in the past, "it never worked ".  He denies vivid dreams, occasionally he may have a nightmare, denies sleepwalking, hallucinations or paranoia.  He denies any hygiene concerns, he needs help with bathing and dressing because of the shoulder.  He has been always meticulous about his medications, but his daughter-in-law helps him monitor her.  He is wife is an Airline pilot, and has always been in charge  of the finances.  His appetite is somewhat decreased "he is less enthusiastic about food lately because of the situation".  He denies trouble swallowing.  He admits to not drinking enough water.  He does not cook.  He denies any significant headaches, occasionally he may have a migraine which resolves quickly.  He denies double vision, dizziness, focal numbness, or tingling.  He has a right frozen shoulder as mentioned before, denies tremors or anosmia.  No history of seizures.  Denies urine incontinence, retention, he does have issues with constipation, denies diarrhea.  20 years ago, he was diagnosed with OSA, but he never wanted to use his CPAP.  He denies alcohol or tobacco.  Family history of 1 aunt with Alzheimer's disease   Pertinent labs A1c 7.5 (high), LDL 77 TSH 2.91, vitamin B12 311   CT of the head without contrast  04/22/2021 No acute intracranial process.. 2. Atrophy with extensive chronic microvascular ischemic changes.3. Chronic right maxillary sinus disease.  PREVIOUS MEDICATIONS:   CURRENT MEDICATIONS:  Outpatient Encounter Medications as of 05/09/2023  Medication Sig   acetaminophen (TYLENOL) 500 MG tablet Take 500 mg by mouth 2 (two) times daily as needed for moderate pain.   bisacodyl (DULCOLAX) 5 MG EC tablet Take 5 mg by mouth daily as needed for severe constipation.   calcitRIOL (ROCALTROL) 0.25 MCG capsule Take 0.25 mcg by mouth 2 (two) times a week.   clopidogrel (PLAVIX) 75 MG tablet Take 1 tablet (75 mg total) by mouth daily. Restart 10/15   Coenzyme Q10 (COQ10) 150 MG CAPS Take 150 mg by mouth every evening.   Continuous Glucose Sensor (FREESTYLE LIBRE 3 SENSOR) MISC Apply topically.   docusate sodium (COLACE) 100 MG capsule Take 100 mg by mouth daily.   Iron, Ferrous Sulfate, 325 (65 Fe) MG TABS Take 325 mg by mouth daily.   levothyroxine (SYNTHROID, LEVOTHROID) 25 MCG tablet Take 25 mcg by mouth every morning.   memantine (NAMENDA) 10 MG tablet Take 1 tablet (10 mg total) by mouth 2 (two) times daily.   metoprolol succinate (TOPROL XL) 25 MG 24 hr tablet Take 1 tablet (25 mg total) by mouth daily.   Multiple Vitamin (RENAL MULTIVITAMIN/ZINC PO) Take 1 tablet by mouth every evening.   nitroGLYCERIN (NITROSTAT) 0.4 MG SL tablet DISSOLVE ONE TABLET UNDER THE TONGUE EVERY 5 MINUTES AS NEEDED FOR CHEST PAIN.   NOVOLIN 70/30 KWIKPEN (70-30) 100 UNIT/ML KwikPen Inject into the skin 2 (two) times daily. Takes 50 units in the morning and 13 units at night.   OVER THE COUNTER MEDICATION Take 2 tablets by mouth in the morning. Emma OTC medication   pantoprazole (PROTONIX) 40 MG tablet Take 1 tablet (40 mg total) by mouth 2 (two) times daily.   polyethylene glycol (MIRALAX / GLYCOLAX) 17 g packet Take 17 g by mouth daily.   rosuvastatin (CRESTOR) 40 MG tablet Take 1 tablet by mouth once daily    Facility-Administered Encounter Medications as of 05/09/2023  Medication   betamethasone acetate-betamethasone sodium phosphate (CELESTONE) injection 3 mg       05/09/2023   11:00 AM 10/19/2022    3:00 PM  MMSE - Mini Mental State Exam  Orientation to time 5 5  Orientation to Place 5 5  Registration 3 3  Attention/ Calculation 5 5  Recall 1 2  Language- name 2 objects 2 2  Language- repeat 1 1  Language- follow 3 step command 3 3  Language- read & follow direction 1 1  Write a sentence 1 0  Copy design 1 1  Total score 28 28       07/22/2021    7:00 AM  Montreal Cognitive Assessment   Visuospatial/ Executive (0/5) 4  Naming (0/3) 2  Attention: Read list of digits (0/2) 1  Attention: Read list of letters (0/1) 1  Attention: Serial 7 subtraction starting at 100 (0/3) 2  Language: Repeat phrase (0/2) 2  Language : Fluency (0/1) 0  Abstraction (0/2) 2  Delayed Recall (0/5) 0  Orientation (0/6) 6  Total 20  Adjusted Score (based on education) 20    Objective:     PHYSICAL EXAMINATION:    VITALS:   Vitals:   05/09/23 1132  BP: 121/60  Pulse: 84  SpO2: 92%  Weight: 229 lb (103.9 kg)  Height: 5\' 7"  (1.702 m)    GEN:  The patient appears stated age and is in NAD. HEENT:  Normocephalic, atraumatic.   Neurological examination:  General: NAD, well-groomed, appears stated age. Orientation: The patient is alert. Oriented to person, place and date Cranial nerves: There is good facial symmetry.The speech is fluent and clear. No aphasia or dysarthria. Fund of knowledge is appropriate. Recent and remote memory impaired. . Attention and concentration are reduced.  Able to name objects and repeat phrases.  Hearing is intact to conversational tone.   Sensation: Sensation is intact to light touch throughout Motor: Strength is at least antigravity x4. DTR's 2/4 in UE/LE     Movement examination: Tone: There is normal tone in the UE/LE Abnormal movements:  mild L  intention tremor.  No myoclonus.  No asterixis.   Coordination:  There is no decremation with RAM's. Normal finger to nose  Gait and Station: The patient has   difficulty arising out of a deep-seated chair without the use of the hands. Needs walker to ambulate.The patient's stride length is good but gets deconditioned if walking.  Gait is cautious and narrow.    Thank you for allowing [place] the opportunity to participate in the care of this nice patient. Please do not hesitate to contact us for any questions or concerns.   Total time spent on today's visit was 35 minutes dedicated to this patient today, preparing to see patient, examining the patient, ordering tests and/or medications and counseling the patient, documenting clinical information in the EHR or other health record, independently interpreting results and communicating results to the patient/family, discussing treatment and goals, answering patient's questions and coordinating care.  Cc:  Mila Palmer, MD  Marlowe Kays 05/09/2023 12:27 PM

## 2023-05-22 ENCOUNTER — Ambulatory Visit (INDEPENDENT_AMBULATORY_CARE_PROVIDER_SITE_OTHER): Payer: Medicare Other | Admitting: Podiatry

## 2023-05-22 ENCOUNTER — Encounter: Payer: Self-pay | Admitting: Podiatry

## 2023-05-22 DIAGNOSIS — E1140 Type 2 diabetes mellitus with diabetic neuropathy, unspecified: Secondary | ICD-10-CM | POA: Diagnosis not present

## 2023-05-22 DIAGNOSIS — M79674 Pain in right toe(s): Secondary | ICD-10-CM

## 2023-05-22 DIAGNOSIS — M79675 Pain in left toe(s): Secondary | ICD-10-CM | POA: Diagnosis not present

## 2023-05-22 DIAGNOSIS — S90122A Contusion of left lesser toe(s) without damage to nail, initial encounter: Secondary | ICD-10-CM

## 2023-05-22 DIAGNOSIS — Z794 Long term (current) use of insulin: Secondary | ICD-10-CM

## 2023-05-22 DIAGNOSIS — B351 Tinea unguium: Secondary | ICD-10-CM | POA: Diagnosis not present

## 2023-05-22 DIAGNOSIS — D689 Coagulation defect, unspecified: Secondary | ICD-10-CM | POA: Diagnosis not present

## 2023-05-22 NOTE — Progress Notes (Signed)
 Subjective:  Patient ID: [name], male    DOB: 08-Oct-1931,   MRN: [id number]  Chief Complaint  Patient presents with   Ingrown Toenail    Patient states he has a possible ingrown toe on 5th toe left foot , it has been like this for about a week . Patient will like to get his toe nails cut also     89 y.o. male presents for concern of thickened elongated and painful nails that are difficult to trim. Requesting to have them trimmed today. Relates burning and tingling in their feet. Patient is diabetic and last A1c was  Lab Results  Component Value Date   HGBA1C 7.2 (H) 03/01/2023   .   PCP:  Verena Mems, MD    . Denies any other pedal complaints. Denies n/v/f/c.   Past Medical History:  Diagnosis Date   Abnormal Doppler ultrasound of carotid artery 12/16/2012   mildly abnormal doppler . no prior studies to compare to. follow up studies are recommended when clinically indicated.   Acute respiratory failure with hypoxia 11/2018   Blepharitis    CAD (coronary artery disease) 07/24/2013   Previously placed drug-eluting stents in the LAD and RCA in 2005, low risk perfusion study December 2011 in January 2014   Cardiomyopathy, ischemic 07/24/2013   EF 45-50% echo March 2014   Carotid stenosis, left 11/2018   on left 60%   Cerebrovascular disease 03/24/2022   CHB (complete heart block) 08/27/2019   Chronic combined systolic and diastolic heart failure 02/19/2015   CKD (chronic kidney disease) stage 3, GFR 30-59 ml/min 07/24/2013   Diabetic hypoglycemia 05/02/2021   Diabetic polyneuropathy 05/02/2021   Gastric ulcer    GI bleed    History of cancer    History of echocardiogram 08/15/2012   EF  45-50 % very difficult study, severe LVH, pacemaker, trace TR,mildly enlarged RV,; aorta mildly calcified   History of stress test 06/06/2012   low risk scan. fixed inferior defect consistant with prior infarction with moderatly reduced EF. no appreciable evidence of ischemia. no  significant change from previous study   Hypothyroidism 11/24/2018   Long term (current) use of insulin  05/02/2021   Mild vascular dementia 03/23/2022   Mixed hyperlipidemia 07/24/2013   Morbid obesity 02/18/2016   OSA (obstructive sleep apnea) 07/24/2013   no CPAP use   Pacemaker 2011   [employer] Accent DDDR ; this is the third generator this pt. has had   Pain due to onychomycosis of toenails of both feet 11/13/2018   Pain in joint of right shoulder 09/15/2021   Second degree AV block 03/09/2014   Sensorineural hearing loss (SNHL) of both ears 04/08/2018   Shingles    Sinus node dysfunction 03/09/2014   TIA (transient ischemic attack) 11/2018   vs stroke not seen on CT   Tinnitus, bilateral 04/08/2018   Type II diabetes mellitus     Objective:  Physical Exam: Vascular: DP/PT pulses 2/4 bilateral. CFT <3 seconds. Absent hair growth on digits. Edema noted to bilateral lower extremities. Xerosis noted bilaterally.  Skin. No lacerations or abrasions bilateral feet. Nails 1-5 bilateral  are thickened discolored and elongated with subungual debris. Left fifth toe with mild erythema around nail and bruising underlying. No sign of infection or ulceration.  Musculoskeletal: MMT 5/5 bilateral lower extremities in DF, PF, Inversion and Eversion. Deceased ROM in DF of ankle joint.  Neurological: Sensation intact to light touch. Protective sensation diminished bilateral.    Assessment:  1. Pain due to onychomycosis of toenails of both feet   2. Type 2 diabetes mellitus with diabetic neuropathy, with long-term current use of insulin  (HCC)   3. Coagulation disorder (HCC)   4. Contusion of lesser toe of left foot without damage to nail, initial encounter      Plan:  Patient was evaluated and treated and all questions answered. -Discussed and educated patient on diabetic foot care, especially with  regards to the vascular, neurological and musculoskeletal systems.  -Stressed the  importance of good glycemic control and the detriment of not  controlling glucose levels in relation to the foot. -Discussed supportive shoes at all times and checking feet regularly.  -Mechanically debrided all nails 1-5 bilateral using sterile nail nipper and filed with dremel without incident  -Answered all patient questions -Patient to return  in 3 months for at risk foot care -Patient advised to call the office if any problems or questions arise in the meantime.   Asberry Failing, DPM

## 2023-05-25 DIAGNOSIS — Z20818 Contact with and (suspected) exposure to other bacterial communicable diseases: Secondary | ICD-10-CM | POA: Diagnosis not present

## 2023-05-25 DIAGNOSIS — I517 Cardiomegaly: Secondary | ICD-10-CM | POA: Diagnosis not present

## 2023-05-25 DIAGNOSIS — R0989 Other specified symptoms and signs involving the circulatory and respiratory systems: Secondary | ICD-10-CM | POA: Diagnosis not present

## 2023-05-25 DIAGNOSIS — R053 Chronic cough: Secondary | ICD-10-CM | POA: Diagnosis not present

## 2023-05-25 DIAGNOSIS — R918 Other nonspecific abnormal finding of lung field: Secondary | ICD-10-CM | POA: Diagnosis not present

## 2023-05-25 DIAGNOSIS — Z20822 Contact with and (suspected) exposure to covid-19: Secondary | ICD-10-CM | POA: Diagnosis not present

## 2023-05-25 DIAGNOSIS — Z03818 Encounter for observation for suspected exposure to other biological agents ruled out: Secondary | ICD-10-CM | POA: Diagnosis not present

## 2023-05-25 DIAGNOSIS — J9611 Chronic respiratory failure with hypoxia: Secondary | ICD-10-CM | POA: Diagnosis not present

## 2023-05-25 DIAGNOSIS — I5042 Chronic combined systolic (congestive) and diastolic (congestive) heart failure: Secondary | ICD-10-CM | POA: Diagnosis not present

## 2023-05-25 DIAGNOSIS — R059 Cough, unspecified: Secondary | ICD-10-CM | POA: Diagnosis not present

## 2023-06-06 DIAGNOSIS — N184 Chronic kidney disease, stage 4 (severe): Secondary | ICD-10-CM | POA: Diagnosis not present

## 2023-06-13 ENCOUNTER — Ambulatory Visit: Payer: BLUE CROSS/BLUE SHIELD | Attending: Physician Assistant | Admitting: Physician Assistant

## 2023-06-13 DIAGNOSIS — E1122 Type 2 diabetes mellitus with diabetic chronic kidney disease: Secondary | ICD-10-CM | POA: Diagnosis not present

## 2023-06-13 DIAGNOSIS — N184 Chronic kidney disease, stage 4 (severe): Secondary | ICD-10-CM | POA: Diagnosis not present

## 2023-06-13 DIAGNOSIS — I495 Sick sinus syndrome: Secondary | ICD-10-CM | POA: Diagnosis not present

## 2023-06-13 DIAGNOSIS — I129 Hypertensive chronic kidney disease with stage 1 through stage 4 chronic kidney disease, or unspecified chronic kidney disease: Secondary | ICD-10-CM | POA: Diagnosis not present

## 2023-06-13 DIAGNOSIS — N2581 Secondary hyperparathyroidism of renal origin: Secondary | ICD-10-CM | POA: Diagnosis not present

## 2023-06-13 NOTE — Progress Notes (Unsigned)
 This encounter was created in error - please disregard.

## 2023-06-26 ENCOUNTER — Ambulatory Visit (INDEPENDENT_AMBULATORY_CARE_PROVIDER_SITE_OTHER): Payer: Medicare Other

## 2023-06-26 ENCOUNTER — Other Ambulatory Visit: Payer: Self-pay | Admitting: Cardiovascular Disease

## 2023-06-26 DIAGNOSIS — I442 Atrioventricular block, complete: Secondary | ICD-10-CM | POA: Diagnosis not present

## 2023-06-26 DIAGNOSIS — I495 Sick sinus syndrome: Secondary | ICD-10-CM

## 2023-06-26 LAB — CUP PACEART REMOTE DEVICE CHECK
Battery Remaining Longevity: 68 mo
Battery Remaining Percentage: 59 %
Battery Voltage: 2.99 V
Brady Statistic AP VP Percent: 17 %
Brady Statistic AP VS Percent: 1 %
Brady Statistic AS VP Percent: 83 %
Brady Statistic AS VS Percent: 1 %
Brady Statistic RA Percent Paced: 16 %
Brady Statistic RV Percent Paced: 99 %
Date Time Interrogation Session: 20250204040014
Implantable Lead Connection Status: 753985
Implantable Lead Connection Status: 753985
Implantable Lead Implant Date: 19901207
Implantable Lead Implant Date: 19901207
Implantable Lead Location: 753859
Implantable Lead Location: 753860
Implantable Pulse Generator Implant Date: 20201116
Lead Channel Impedance Value: 290 Ohm
Lead Channel Impedance Value: 530 Ohm
Lead Channel Pacing Threshold Amplitude: 0.5 V
Lead Channel Pacing Threshold Amplitude: 1.125 V
Lead Channel Pacing Threshold Pulse Width: 0.4 ms
Lead Channel Pacing Threshold Pulse Width: 0.4 ms
Lead Channel Sensing Intrinsic Amplitude: 12 mV
Lead Channel Sensing Intrinsic Amplitude: 3.8 mV
Lead Channel Setting Pacing Amplitude: 1.375
Lead Channel Setting Pacing Amplitude: 2 V
Lead Channel Setting Pacing Pulse Width: 0.4 ms
Lead Channel Setting Sensing Sensitivity: 3 mV
Pulse Gen Model: 2272
Pulse Gen Serial Number: 9177614

## 2023-06-30 ENCOUNTER — Encounter: Payer: Self-pay | Admitting: Cardiovascular Disease

## 2023-07-03 ENCOUNTER — Encounter (HOSPITAL_COMMUNITY): Payer: Self-pay

## 2023-07-03 ENCOUNTER — Other Ambulatory Visit: Payer: Self-pay

## 2023-07-03 ENCOUNTER — Emergency Department (HOSPITAL_COMMUNITY)
Admission: EM | Admit: 2023-07-03 | Discharge: 2023-07-03 | Disposition: A | Discharge status: Home / Self Care | Payer: Medicare Other | Source: Home / Self Care | Attending: Emergency Medicine | Admitting: Emergency Medicine

## 2023-07-03 ENCOUNTER — Emergency Department (HOSPITAL_COMMUNITY): Payer: Medicare Other

## 2023-07-03 DIAGNOSIS — Z7902 Long term (current) use of antithrombotics/antiplatelets: Secondary | ICD-10-CM | POA: Insufficient documentation

## 2023-07-03 DIAGNOSIS — K254 Chronic or unspecified gastric ulcer with hemorrhage: Secondary | ICD-10-CM | POA: Diagnosis present

## 2023-07-03 DIAGNOSIS — I2510 Atherosclerotic heart disease of native coronary artery without angina pectoris: Secondary | ICD-10-CM | POA: Diagnosis not present

## 2023-07-03 DIAGNOSIS — E119 Type 2 diabetes mellitus without complications: Secondary | ICD-10-CM | POA: Diagnosis not present

## 2023-07-03 DIAGNOSIS — Z794 Long term (current) use of insulin: Secondary | ICD-10-CM | POA: Insufficient documentation

## 2023-07-03 DIAGNOSIS — D509 Iron deficiency anemia, unspecified: Secondary | ICD-10-CM | POA: Diagnosis not present

## 2023-07-03 DIAGNOSIS — K922 Gastrointestinal hemorrhage, unspecified: Secondary | ICD-10-CM | POA: Diagnosis not present

## 2023-07-03 DIAGNOSIS — K861 Other chronic pancreatitis: Secondary | ICD-10-CM | POA: Diagnosis not present

## 2023-07-03 DIAGNOSIS — Z79890 Hormone replacement therapy (postmenopausal): Secondary | ICD-10-CM | POA: Diagnosis not present

## 2023-07-03 DIAGNOSIS — K5909 Other constipation: Secondary | ICD-10-CM | POA: Diagnosis present

## 2023-07-03 DIAGNOSIS — N184 Chronic kidney disease, stage 4 (severe): Secondary | ICD-10-CM | POA: Diagnosis not present

## 2023-07-03 DIAGNOSIS — E039 Hypothyroidism, unspecified: Secondary | ICD-10-CM | POA: Diagnosis not present

## 2023-07-03 DIAGNOSIS — D649 Anemia, unspecified: Secondary | ICD-10-CM | POA: Insufficient documentation

## 2023-07-03 DIAGNOSIS — R001 Bradycardia, unspecified: Secondary | ICD-10-CM | POA: Diagnosis not present

## 2023-07-03 DIAGNOSIS — F01A0 Vascular dementia, mild, without behavioral disturbance, psychotic disturbance, mood disturbance, and anxiety: Secondary | ICD-10-CM | POA: Diagnosis present

## 2023-07-03 DIAGNOSIS — I5042 Chronic combined systolic (congestive) and diastolic (congestive) heart failure: Secondary | ICD-10-CM | POA: Diagnosis not present

## 2023-07-03 DIAGNOSIS — Z1152 Encounter for screening for COVID-19: Secondary | ICD-10-CM | POA: Diagnosis not present

## 2023-07-03 DIAGNOSIS — I130 Hypertensive heart and chronic kidney disease with heart failure and stage 1 through stage 4 chronic kidney disease, or unspecified chronic kidney disease: Secondary | ICD-10-CM | POA: Diagnosis present

## 2023-07-03 DIAGNOSIS — Z79899 Other long term (current) drug therapy: Secondary | ICD-10-CM | POA: Diagnosis not present

## 2023-07-03 DIAGNOSIS — Z8673 Personal history of transient ischemic attack (TIA), and cerebral infarction without residual deficits: Secondary | ICD-10-CM | POA: Diagnosis not present

## 2023-07-03 DIAGNOSIS — J9611 Chronic respiratory failure with hypoxia: Secondary | ICD-10-CM | POA: Diagnosis present

## 2023-07-03 DIAGNOSIS — R531 Weakness: Secondary | ICD-10-CM | POA: Insufficient documentation

## 2023-07-03 DIAGNOSIS — K5730 Diverticulosis of large intestine without perforation or abscess without bleeding: Secondary | ICD-10-CM | POA: Diagnosis not present

## 2023-07-03 DIAGNOSIS — I10 Essential (primary) hypertension: Secondary | ICD-10-CM | POA: Diagnosis not present

## 2023-07-03 DIAGNOSIS — I442 Atrioventricular block, complete: Secondary | ICD-10-CM | POA: Diagnosis present

## 2023-07-03 DIAGNOSIS — D500 Iron deficiency anemia secondary to blood loss (chronic): Secondary | ICD-10-CM | POA: Diagnosis not present

## 2023-07-03 DIAGNOSIS — Z87891 Personal history of nicotine dependence: Secondary | ICD-10-CM | POA: Diagnosis not present

## 2023-07-03 DIAGNOSIS — N183 Chronic kidney disease, stage 3 unspecified: Secondary | ICD-10-CM | POA: Insufficient documentation

## 2023-07-03 DIAGNOSIS — W19XXXA Unspecified fall, initial encounter: Secondary | ICD-10-CM | POA: Diagnosis not present

## 2023-07-03 DIAGNOSIS — Z8249 Family history of ischemic heart disease and other diseases of the circulatory system: Secondary | ICD-10-CM | POA: Diagnosis not present

## 2023-07-03 DIAGNOSIS — D62 Acute posthemorrhagic anemia: Secondary | ICD-10-CM | POA: Diagnosis not present

## 2023-07-03 DIAGNOSIS — E1142 Type 2 diabetes mellitus with diabetic polyneuropathy: Secondary | ICD-10-CM | POA: Diagnosis present

## 2023-07-03 DIAGNOSIS — Z8711 Personal history of peptic ulcer disease: Secondary | ICD-10-CM | POA: Diagnosis not present

## 2023-07-03 DIAGNOSIS — K8020 Calculus of gallbladder without cholecystitis without obstruction: Secondary | ICD-10-CM | POA: Diagnosis not present

## 2023-07-03 DIAGNOSIS — I517 Cardiomegaly: Secondary | ICD-10-CM | POA: Diagnosis not present

## 2023-07-03 DIAGNOSIS — R0602 Shortness of breath: Secondary | ICD-10-CM | POA: Diagnosis not present

## 2023-07-03 DIAGNOSIS — K259 Gastric ulcer, unspecified as acute or chronic, without hemorrhage or perforation: Secondary | ICD-10-CM | POA: Diagnosis not present

## 2023-07-03 DIAGNOSIS — E1122 Type 2 diabetes mellitus with diabetic chronic kidney disease: Secondary | ICD-10-CM | POA: Insufficient documentation

## 2023-07-03 DIAGNOSIS — Z859 Personal history of malignant neoplasm, unspecified: Secondary | ICD-10-CM | POA: Insufficient documentation

## 2023-07-03 DIAGNOSIS — E782 Mixed hyperlipidemia: Secondary | ICD-10-CM | POA: Diagnosis not present

## 2023-07-03 DIAGNOSIS — R58 Hemorrhage, not elsewhere classified: Secondary | ICD-10-CM | POA: Diagnosis not present

## 2023-07-03 DIAGNOSIS — I255 Ischemic cardiomyopathy: Secondary | ICD-10-CM | POA: Diagnosis present

## 2023-07-03 DIAGNOSIS — K921 Melena: Secondary | ICD-10-CM | POA: Diagnosis not present

## 2023-07-03 DIAGNOSIS — Z8679 Personal history of other diseases of the circulatory system: Secondary | ICD-10-CM | POA: Diagnosis not present

## 2023-07-03 DIAGNOSIS — E038 Other specified hypothyroidism: Secondary | ICD-10-CM | POA: Diagnosis not present

## 2023-07-03 DIAGNOSIS — D631 Anemia in chronic kidney disease: Secondary | ICD-10-CM | POA: Diagnosis present

## 2023-07-03 LAB — URINALYSIS, W/ REFLEX TO CULTURE (INFECTION SUSPECTED)
Bacteria, UA: NONE SEEN
Bilirubin Urine: NEGATIVE
Glucose, UA: 50 mg/dL — AB
Hgb urine dipstick: NEGATIVE
Ketones, ur: NEGATIVE mg/dL
Leukocytes,Ua: NEGATIVE
Nitrite: NEGATIVE
Protein, ur: NEGATIVE mg/dL
Specific Gravity, Urine: 1.015 (ref 1.005–1.030)
pH: 5 (ref 5.0–8.0)

## 2023-07-03 LAB — CBC WITH DIFFERENTIAL/PLATELET
Abs Immature Granulocytes: 0.06 10*3/uL (ref 0.00–0.07)
Basophils Absolute: 0 10*3/uL (ref 0.0–0.1)
Basophils Relative: 0 %
Eosinophils Absolute: 0 10*3/uL (ref 0.0–0.5)
Eosinophils Relative: 0 %
HCT: 27.4 % — ABNORMAL LOW (ref 39.0–52.0)
Hemoglobin: 8.5 g/dL — ABNORMAL LOW (ref 13.0–17.0)
Immature Granulocytes: 1 %
Lymphocytes Relative: 12 %
Lymphs Abs: 1.6 10*3/uL (ref 0.7–4.0)
MCH: 28.1 pg (ref 26.0–34.0)
MCHC: 31 g/dL (ref 30.0–36.0)
MCV: 90.4 fL (ref 80.0–100.0)
Monocytes Absolute: 0.8 10*3/uL (ref 0.1–1.0)
Monocytes Relative: 6 %
Neutro Abs: 10.7 10*3/uL — ABNORMAL HIGH (ref 1.7–7.7)
Neutrophils Relative %: 81 %
Platelets: 152 10*3/uL (ref 150–400)
RBC: 3.03 MIL/uL — ABNORMAL LOW (ref 4.22–5.81)
RDW: 17.5 % — ABNORMAL HIGH (ref 11.5–15.5)
WBC: 13.2 10*3/uL — ABNORMAL HIGH (ref 4.0–10.5)
nRBC: 0 % (ref 0.0–0.2)

## 2023-07-03 LAB — RESP PANEL BY RT-PCR (RSV, FLU A&B, COVID)  RVPGX2
Influenza A by PCR: NEGATIVE
Influenza B by PCR: NEGATIVE
Resp Syncytial Virus by PCR: NEGATIVE
SARS Coronavirus 2 by RT PCR: NEGATIVE

## 2023-07-03 LAB — COMPREHENSIVE METABOLIC PANEL
ALT: 13 U/L (ref 0–44)
AST: 18 U/L (ref 15–41)
Albumin: 3 g/dL — ABNORMAL LOW (ref 3.5–5.0)
Alkaline Phosphatase: 37 U/L — ABNORMAL LOW (ref 38–126)
Anion gap: 9 (ref 5–15)
BUN: 118 mg/dL — ABNORMAL HIGH (ref 8–23)
CO2: 23 mmol/L (ref 22–32)
Calcium: 9 mg/dL (ref 8.9–10.3)
Chloride: 104 mmol/L (ref 98–111)
Creatinine, Ser: 2.49 mg/dL — ABNORMAL HIGH (ref 0.61–1.24)
GFR, Estimated: 24 mL/min — ABNORMAL LOW (ref >60)
Glucose, Bld: 223 mg/dL — ABNORMAL HIGH (ref 70–99)
Potassium: 4.9 mmol/L (ref 3.5–5.1)
Sodium: 136 mmol/L (ref 135–145)
Total Bilirubin: 0.5 mg/dL (ref 0.0–1.2)
Total Protein: 5.8 g/dL — ABNORMAL LOW (ref 6.5–8.1)

## 2023-07-03 LAB — TSH: TSH: 2.364 u[IU]/mL (ref 0.350–4.500)

## 2023-07-03 LAB — TROPONIN I (HIGH SENSITIVITY)
Troponin I (High Sensitivity): 22 ng/L — ABNORMAL HIGH (ref <18)
Troponin I (High Sensitivity): 23 ng/L — ABNORMAL HIGH (ref <18)

## 2023-07-03 LAB — POC OCCULT BLOOD, ED: Fecal Occult Bld: NEGATIVE

## 2023-07-03 NOTE — Discharge Instructions (Addendum)
Follow-up with your doctors.  Your workup was overall reassuring today.  Your hemoglobin is what it was when he left the hospital but you do not have blood in your stool.

## 2023-07-03 NOTE — ED Triage Notes (Signed)
Pt BIB [facility] from home d/t feeling weak, lethargic & less able to mobilize as usual. EMS reports during the night at approx. 0200 he woke with SOB & his O2 tubing was kinked & that was fixed & then he went back to sleep. Also reports Hx of needing a transfusion d/t internal bleed but denies any red or dark tarry stools at this time. A/Ox4, is wearing his baseline 2L Os via n/c, monitor shows paced rhythm, 120/70, CBG 247.

## 2023-07-03 NOTE — ED Notes (Signed)
Patient transported to X-ray

## 2023-07-03 NOTE — ED Provider Notes (Signed)
[place] EMERGENCY DEPARTMENT AT [facility] Provider Note   CSN: [id number] Arrival date & time: 07/03/23  0901     History  Chief Complaint  Patient presents with   Generalized Weakness   Lethargic    [name] is a 89 y.o. male.  HPI Patient presents with weakness.  Woke up around midnight last night with shortness of breath.  Thought it was due to his oxygen.  States there was a kink in the oxygen tubing I replaced it and he went back to sleep but then feeling more short of breath.  States he has been more fatigued today.  He does have some crampy abdominal pain that is somewhat diffuse.  No blood in the stool.  Has had previous GI bleeds.   Past Medical History:  Diagnosis Date   Abnormal Doppler ultrasound of carotid artery 12/16/2012   mildly abnormal doppler . no prior studies to compare to. follow up studies are recommended when clinically indicated.   Acute respiratory failure with hypoxia 11/2018   Blepharitis    CAD (coronary artery disease) 07/24/2013   Previously placed drug-eluting stents in the LAD and RCA in 2005, low risk perfusion study December 2011 in January 2014   Cardiomyopathy, ischemic 07/24/2013   EF 45-50% echo March 2014   Carotid stenosis, left 11/2018   on left 60%   Cerebrovascular disease 03/24/2022   CHB (complete heart block) 08/27/2019   Chronic combined systolic and diastolic heart failure 02/19/2015   CKD (chronic kidney disease) stage 3, GFR 30-59 ml/min 07/24/2013   Diabetic hypoglycemia 05/02/2021   Diabetic polyneuropathy 05/02/2021   Gastric ulcer    GI bleed    History of cancer    History of echocardiogram 08/15/2012   EF  45-50 % very difficult study, severe LVH, pacemaker, trace TR,mildly enlarged RV,; aorta mildly calcified   History of stress test 06/06/2012   low risk scan. fixed inferior defect consistant with prior infarction with moderatly reduced EF. no appreciable evidence of ischemia. 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 DDDR ; this is the third generator this pt. has had   Pain due to onychomycosis of toenails of both feet 11/13/2018   Pain in joint of right shoulder 09/15/2021   Second degree AV block 03/09/2014   Sensorineural hearing loss (SNHL) of both ears 04/08/2018   Shingles    Sinus node dysfunction 03/09/2014   TIA (transient ischemic attack) 11/2018   vs stroke not seen on CT   Tinnitus, bilateral 04/08/2018   Type II diabetes mellitus     Home Medications Prior to Admission medications   Medication Sig Start Date End Date Taking? Authorizing Provider  acetaminophen (TYLENOL) 500 MG tablet Take 500 mg by mouth 2 (two) times daily as needed for moderate pain.    [provider]  bisacodyl (DULCOLAX) 5 MG EC tablet Take 5 mg by mouth daily as needed for severe constipation.    [provider]  calcitRIOL (ROCALTROL) 0.25 MCG capsule Take 0.25 mcg by mouth 2 (two) times a week. 02/05/23   [provider]  clopidogrel (PLAVIX) 75 MG tablet Take 1 tablet (75 mg total) by mouth daily. Restart 10/15  03/04/23   Standley Brooking, MD  Coenzyme Q10 (COQ10) 150 MG CAPS Take 150 mg by mouth every evening.    [provider]  Continuous Glucose Sensor (FREESTYLE LIBRE 3 SENSOR) MISC Apply topically.    [provider]  docusate sodium (COLACE) 100 MG capsule Take 100 mg by mouth daily.    [provider]  Iron, Ferrous Sulfate, 325 (65 Fe) MG TABS Take 325 mg by mouth daily. 03/04/23   Standley Brooking, MD  levothyroxine (SYNTHROID, LEVOTHROID) 25 MCG tablet Take 25 mcg by mouth every morning. 02/09/14   [provider]  memantine (NAMENDA) 10 MG  tablet Take 1 tablet (10 mg total) by mouth 2 (two) times daily. 10/19/22   Marcos Eke, PA-C  metoprolol succinate (TOPROL XL) 25 MG 24 hr tablet Take 1 tablet (25 mg total) by mouth daily. 02/20/23   Croitoru, Mihai, MD  Multiple Vitamin (RENAL MULTIVITAMIN/ZINC PO) Take 1 tablet by mouth every evening.    [provider]  nitroGLYCERIN (NITROSTAT) 0.4 MG SL tablet DISSOLVE ONE TABLET UNDER THE TONGUE EVERY 5 MINUTES AS NEEDED FOR CHEST PAIN. 03/13/22   Croitoru, Mihai, MD  NOVOLIN 70/30 KWIKPEN (70-30) 100 UNIT/ML KwikPen Inject into the skin 2 (two) times daily. Takes 50 units in the morning and 13 units at night. 04/25/22   [provider]  OVER THE COUNTER MEDICATION Take 2 tablets by mouth in the morning. Emma OTC medication    [provider]  pantoprazole (PROTONIX) 40 MG tablet Take 1 tablet (40 mg total) by mouth 2 (two) times daily. 03/04/23   Standley Brooking, MD  polyethylene glycol (MIRALAX / GLYCOLAX) 17 g packet Take 17 g by mouth daily. 08/25/21   Arby Barrette, MD  rosuvastatin (CRESTOR) 40 MG tablet Take 1 tablet by mouth once daily 06/27/23   Denyce Robert, NP      Allergies    Patient has no known allergies.    Review of Systems   Review of Systems  Physical Exam Updated Vital Signs BP (!) 146/75 (BP Location: Left Arm)   Pulse 78   Temp 98.1 F (36.7 C) (Oral)   Resp (!) 22   Ht 5\' 7"  (1.702 m)   Wt 105.2 kg   SpO2 100%   BMI 36.34 kg/m  Physical Exam Vitals and nursing note reviewed.  Cardiovascular:     Rate and Rhythm: Regular rhythm.  Pulmonary:     Breath sounds: No wheezing or rhonchi.  Abdominal:     Tenderness: There is abdominal tenderness.     Comments:  think mild diffuse tenderness without rebound or guarding.  No hernia palpated.  Musculoskeletal:     Cervical back: Neck supple.     Right lower leg: Edema present.     Left lower leg: Edema present.  Neurological:     Mental Status: He is alert.     ED  Results / Procedures / Treatments   Labs (all labs ordered are listed, but only abnormal results are displayed) Labs Reviewed  COMPREHENSIVE METABOLIC PANEL - Abnormal; Notable for the following components:      Result Value   Glucose, Bld 223 (*)    BUN 118 (*)    Creatinine, Ser 2.49 (*)    Total Protein 5.8 (*)    Albumin 3.0 (*)    Alkaline Phosphatase 37 (*)    GFR, Estimated 24 (*)    All other components within normal limits  CBC  WITH DIFFERENTIAL/PLATELET - Abnormal; Notable for the following components:   WBC 13.2 (*)    RBC 3.03 (*)    Hemoglobin 8.5 (*)    HCT 27.4 (*)    RDW 17.5 (*)    Neutro Abs 10.7 (*)    All other components within normal limits  URINALYSIS, W/ REFLEX TO CULTURE (INFECTION SUSPECTED) - Abnormal; Notable for the following components:   Glucose, UA 50 (*)    All other components within normal limits  TROPONIN I (HIGH SENSITIVITY) - Abnormal; Notable for the following components:   Troponin I (High Sensitivity) 22 (*)    All other components within normal limits  TROPONIN I (HIGH SENSITIVITY) - Abnormal; Notable for the following components:   Troponin I (High Sensitivity) 23 (*)    All other components within normal limits  RESP PANEL BY RT-PCR (RSV, FLU A&B, COVID)  RVPGX2  TSH  POC OCCULT BLOOD, ED    EKG EKG Interpretation Date/Time:  Tuesday July 03 2023 09:31:00 EST Ventricular Rate:  75 PR Interval:  195 QRS Duration:  186 QT Interval:  460 QTC Calculation: 514 R Axis:   -65  Text Interpretation: atrial sensed ventricular pacing Nonspecific IVCD with LAD LVH with secondary repolarization abnormality Anterolateral infarct, age indeterminate Confirmed by Benjiman Core [phone]) on 07/03/2023 10:31:21 AM  Radiology DG Abdomen Acute W/Chest Result Date: 07/03/2023 CLINICAL DATA:  Abdominal pain. EXAM: DG ABDOMEN ACUTE WITH 1 VIEW CHEST COMPARISON:  Chest radiograph dated 05/25/2023. Abdominal radiographs dated 02/27/2023.  FINDINGS: Scattered gas and stool in the colon. No small or large bowel distention. No free intra-abdominal air. No radiopaque stones. Prostatic seed implants project over the pelvis. Visualized bones appear intact. Stable cardiomegaly. Aortic atherosclerosis. Unchanged right chest wall pacing device in place. Mild right basilar atelectasis/scarring. No appreciable focal consolidation, sizeable pleural effusion, or pneumothorax. IMPRESSION: 1. Negative abdominal radiographs. 2. Mild right basilar atelectasis/scarring. Otherwise, no acute cardiopulmonary findings. Electronically Signed   By: Hart Robinsons M.D.   On: 07/03/2023 12:04    Procedures Procedures    Medications Ordered in ED Medications - No data to display  ED Course/ Medical Decision Making/ A&P                                 Medical Decision Making Amount and/or Complexity of Data Reviewed Labs: ordered. Radiology: ordered.    this morning patient with shortness of breath and feeling generalized weak.  Also abdominal pain.  Differential diagnose long but includes causes such as infection, intra-abdominal infection, anemia.  No black stools.  But has recent GI bleeds.  Will get basic blood work and acute abdominal series to start with.  Reviewed recent discharge note.  Hemoglobin similar to discharge and that it was 9.5 on discharge but is now 8.5.  Denies black stool.  However does have abdominal pain but he says it is now improving.  White count is up.  Abdominal tenderness is improved.  Will check Hemoccult.  Troponin elevated but stable from prior.  Kidney function also elevated but similar to prior.   Patient has not tolerated orals.  Hemoccult negative.  Patient is ready to go home.  Can follow-up as an outpatient.  Does have mild anemia but appears stable to discharge.  Can follow-up with PCP.  Discharge.       Final Clinical Impression(s) / ED Diagnoses Final diagnoses:  Weakness  Anemia, unspecified type  Rx / DC Orders ED Discharge Orders     None         Benjiman Core, MD 07/03/23 1609

## 2023-07-03 NOTE — ED Notes (Signed)
Patient verbalizes understanding of discharge instructions. Opportunity for questioning and answers were provided. Armband removed by staff, pt discharged from ED. Pt taken to ED waiting room via wheel chair.

## 2023-07-05 DIAGNOSIS — D500 Iron deficiency anemia secondary to blood loss (chronic): Secondary | ICD-10-CM | POA: Diagnosis not present

## 2023-07-06 ENCOUNTER — Emergency Department (HOSPITAL_COMMUNITY): Payer: Medicare Other

## 2023-07-06 ENCOUNTER — Encounter (HOSPITAL_COMMUNITY): Payer: Self-pay

## 2023-07-06 ENCOUNTER — Inpatient Hospital Stay (HOSPITAL_COMMUNITY)
Admission: EM | Admit: 2023-07-06 | Discharge: 2023-07-09 | DRG: 378 | Disposition: A | Discharge status: Home / Self Care | Payer: Medicare Other | Attending: Internal Medicine | Admitting: Internal Medicine

## 2023-07-06 ENCOUNTER — Other Ambulatory Visit: Payer: Self-pay

## 2023-07-06 DIAGNOSIS — I130 Hypertensive heart and chronic kidney disease with heart failure and stage 1 through stage 4 chronic kidney disease, or unspecified chronic kidney disease: Secondary | ICD-10-CM | POA: Diagnosis present

## 2023-07-06 DIAGNOSIS — K922 Gastrointestinal hemorrhage, unspecified: Secondary | ICD-10-CM | POA: Diagnosis not present

## 2023-07-06 DIAGNOSIS — N184 Chronic kidney disease, stage 4 (severe): Secondary | ICD-10-CM | POA: Diagnosis present

## 2023-07-06 DIAGNOSIS — Z1152 Encounter for screening for COVID-19: Secondary | ICD-10-CM | POA: Diagnosis not present

## 2023-07-06 DIAGNOSIS — D649 Anemia, unspecified: Secondary | ICD-10-CM | POA: Diagnosis not present

## 2023-07-06 DIAGNOSIS — E782 Mixed hyperlipidemia: Secondary | ICD-10-CM | POA: Diagnosis present

## 2023-07-06 DIAGNOSIS — I2510 Atherosclerotic heart disease of native coronary artery without angina pectoris: Secondary | ICD-10-CM | POA: Diagnosis not present

## 2023-07-06 DIAGNOSIS — Z87891 Personal history of nicotine dependence: Secondary | ICD-10-CM

## 2023-07-06 DIAGNOSIS — K8020 Calculus of gallbladder without cholecystitis without obstruction: Secondary | ICD-10-CM | POA: Diagnosis present

## 2023-07-06 DIAGNOSIS — Z8711 Personal history of peptic ulcer disease: Secondary | ICD-10-CM | POA: Diagnosis not present

## 2023-07-06 DIAGNOSIS — I5042 Chronic combined systolic (congestive) and diastolic (congestive) heart failure: Secondary | ICD-10-CM | POA: Diagnosis present

## 2023-07-06 DIAGNOSIS — Z79890 Hormone replacement therapy (postmenopausal): Secondary | ICD-10-CM | POA: Diagnosis not present

## 2023-07-06 DIAGNOSIS — E1122 Type 2 diabetes mellitus with diabetic chronic kidney disease: Secondary | ICD-10-CM | POA: Diagnosis present

## 2023-07-06 DIAGNOSIS — Z955 Presence of coronary angioplasty implant and graft: Secondary | ICD-10-CM

## 2023-07-06 DIAGNOSIS — K921 Melena: Secondary | ICD-10-CM

## 2023-07-06 DIAGNOSIS — Z794 Long term (current) use of insulin: Secondary | ICD-10-CM

## 2023-07-06 DIAGNOSIS — R531 Weakness: Secondary | ICD-10-CM | POA: Diagnosis not present

## 2023-07-06 DIAGNOSIS — Z79899 Other long term (current) drug therapy: Secondary | ICD-10-CM | POA: Diagnosis not present

## 2023-07-06 DIAGNOSIS — D631 Anemia in chronic kidney disease: Secondary | ICD-10-CM | POA: Diagnosis present

## 2023-07-06 DIAGNOSIS — K5909 Other constipation: Secondary | ICD-10-CM | POA: Diagnosis present

## 2023-07-06 DIAGNOSIS — E038 Other specified hypothyroidism: Secondary | ICD-10-CM

## 2023-07-06 DIAGNOSIS — T39015A Adverse effect of aspirin, initial encounter: Secondary | ICD-10-CM | POA: Diagnosis present

## 2023-07-06 DIAGNOSIS — R0602 Shortness of breath: Secondary | ICD-10-CM | POA: Diagnosis not present

## 2023-07-06 DIAGNOSIS — Z8249 Family history of ischemic heart disease and other diseases of the circulatory system: Secondary | ICD-10-CM

## 2023-07-06 DIAGNOSIS — Z950 Presence of cardiac pacemaker: Secondary | ICD-10-CM

## 2023-07-06 DIAGNOSIS — C439 Malignant melanoma of skin, unspecified: Secondary | ICD-10-CM

## 2023-07-06 DIAGNOSIS — I255 Ischemic cardiomyopathy: Secondary | ICD-10-CM | POA: Diagnosis present

## 2023-07-06 DIAGNOSIS — Z7902 Long term (current) use of antithrombotics/antiplatelets: Secondary | ICD-10-CM

## 2023-07-06 DIAGNOSIS — D509 Iron deficiency anemia, unspecified: Secondary | ICD-10-CM | POA: Diagnosis present

## 2023-07-06 DIAGNOSIS — E1142 Type 2 diabetes mellitus with diabetic polyneuropathy: Secondary | ICD-10-CM | POA: Diagnosis present

## 2023-07-06 DIAGNOSIS — K5730 Diverticulosis of large intestine without perforation or abscess without bleeding: Secondary | ICD-10-CM | POA: Diagnosis not present

## 2023-07-06 DIAGNOSIS — Z8679 Personal history of other diseases of the circulatory system: Secondary | ICD-10-CM

## 2023-07-06 DIAGNOSIS — K861 Other chronic pancreatitis: Secondary | ICD-10-CM | POA: Diagnosis present

## 2023-07-06 DIAGNOSIS — Z9981 Dependence on supplemental oxygen: Secondary | ICD-10-CM

## 2023-07-06 DIAGNOSIS — J9611 Chronic respiratory failure with hypoxia: Secondary | ICD-10-CM | POA: Diagnosis present

## 2023-07-06 DIAGNOSIS — K259 Gastric ulcer, unspecified as acute or chronic, without hemorrhage or perforation: Secondary | ICD-10-CM | POA: Diagnosis not present

## 2023-07-06 DIAGNOSIS — D6832 Hemorrhagic disorder due to extrinsic circulating anticoagulants: Secondary | ICD-10-CM | POA: Diagnosis present

## 2023-07-06 DIAGNOSIS — E119 Type 2 diabetes mellitus without complications: Secondary | ICD-10-CM | POA: Diagnosis not present

## 2023-07-06 DIAGNOSIS — K254 Chronic or unspecified gastric ulcer with hemorrhage: Principal | ICD-10-CM | POA: Diagnosis present

## 2023-07-06 DIAGNOSIS — I442 Atrioventricular block, complete: Secondary | ICD-10-CM | POA: Diagnosis present

## 2023-07-06 DIAGNOSIS — D62 Acute posthemorrhagic anemia: Secondary | ICD-10-CM | POA: Diagnosis not present

## 2023-07-06 DIAGNOSIS — Z8673 Personal history of transient ischemic attack (TIA), and cerebral infarction without residual deficits: Secondary | ICD-10-CM | POA: Diagnosis not present

## 2023-07-06 DIAGNOSIS — D500 Iron deficiency anemia secondary to blood loss (chronic): Secondary | ICD-10-CM | POA: Diagnosis not present

## 2023-07-06 DIAGNOSIS — E039 Hypothyroidism, unspecified: Secondary | ICD-10-CM | POA: Diagnosis present

## 2023-07-06 DIAGNOSIS — F01A0 Vascular dementia, mild, without behavioral disturbance, psychotic disturbance, mood disturbance, and anxiety: Secondary | ICD-10-CM | POA: Diagnosis present

## 2023-07-06 DIAGNOSIS — Z7982 Long term (current) use of aspirin: Secondary | ICD-10-CM

## 2023-07-06 DIAGNOSIS — I10 Essential (primary) hypertension: Secondary | ICD-10-CM | POA: Diagnosis not present

## 2023-07-06 DIAGNOSIS — N183 Chronic kidney disease, stage 3 unspecified: Secondary | ICD-10-CM | POA: Diagnosis not present

## 2023-07-06 DIAGNOSIS — I517 Cardiomegaly: Secondary | ICD-10-CM | POA: Diagnosis not present

## 2023-07-06 DIAGNOSIS — G4733 Obstructive sleep apnea (adult) (pediatric): Secondary | ICD-10-CM | POA: Diagnosis present

## 2023-07-06 DIAGNOSIS — T45525A Adverse effect of antithrombotic drugs, initial encounter: Secondary | ICD-10-CM | POA: Diagnosis present

## 2023-07-06 HISTORY — DX: Other complications of anesthesia, initial encounter: T88.59XA

## 2023-07-06 LAB — CBC WITH DIFFERENTIAL/PLATELET
Abs Immature Granulocytes: 0.03 10*3/uL (ref 0.00–0.07)
Basophils Absolute: 0 10*3/uL (ref 0.0–0.1)
Basophils Relative: 0 %
Eosinophils Absolute: 0.1 10*3/uL (ref 0.0–0.5)
Eosinophils Relative: 1 %
HCT: 23.4 % — ABNORMAL LOW (ref 39.0–52.0)
Hemoglobin: 7.2 g/dL — ABNORMAL LOW (ref 13.0–17.0)
Immature Granulocytes: 0 %
Lymphocytes Relative: 20 %
Lymphs Abs: 1.8 10*3/uL (ref 0.7–4.0)
MCH: 28.1 pg (ref 26.0–34.0)
MCHC: 30.8 g/dL (ref 30.0–36.0)
MCV: 91.4 fL (ref 80.0–100.0)
Monocytes Absolute: 0.9 10*3/uL (ref 0.1–1.0)
Monocytes Relative: 10 %
Neutro Abs: 6.2 10*3/uL (ref 1.7–7.7)
Neutrophils Relative %: 69 %
Platelets: 170 10*3/uL (ref 150–400)
RBC: 2.56 MIL/uL — ABNORMAL LOW (ref 4.22–5.81)
RDW: 19.4 % — ABNORMAL HIGH (ref 11.5–15.5)
WBC: 9.1 10*3/uL (ref 4.0–10.5)
nRBC: 0 % (ref 0.0–0.2)

## 2023-07-06 LAB — COMPREHENSIVE METABOLIC PANEL
ALT: 21 U/L (ref 0–44)
AST: 30 U/L (ref 15–41)
Albumin: 3.2 g/dL — ABNORMAL LOW (ref 3.5–5.0)
Alkaline Phosphatase: 40 U/L (ref 38–126)
Anion gap: 9 (ref 5–15)
BUN: 64 mg/dL — ABNORMAL HIGH (ref 8–23)
CO2: 27 mmol/L (ref 22–32)
Calcium: 9 mg/dL (ref 8.9–10.3)
Chloride: 104 mmol/L (ref 98–111)
Creatinine, Ser: 2.29 mg/dL — ABNORMAL HIGH (ref 0.61–1.24)
GFR, Estimated: 27 mL/min — ABNORMAL LOW (ref >60)
Glucose, Bld: 105 mg/dL — ABNORMAL HIGH (ref 70–99)
Potassium: 4.3 mmol/L (ref 3.5–5.1)
Sodium: 140 mmol/L (ref 135–145)
Total Bilirubin: 0.5 mg/dL (ref 0.0–1.2)
Total Protein: 6.1 g/dL — ABNORMAL LOW (ref 6.5–8.1)

## 2023-07-06 LAB — URINALYSIS, ROUTINE W REFLEX MICROSCOPIC
Bilirubin Urine: NEGATIVE
Glucose, UA: NEGATIVE mg/dL
Hgb urine dipstick: NEGATIVE
Ketones, ur: NEGATIVE mg/dL
Leukocytes,Ua: NEGATIVE
Nitrite: NEGATIVE
Protein, ur: NEGATIVE mg/dL
Specific Gravity, Urine: 1.014 (ref 1.005–1.030)
pH: 5 (ref 5.0–8.0)

## 2023-07-06 LAB — PREPARE RBC (CROSSMATCH)

## 2023-07-06 LAB — POC OCCULT BLOOD, ED: Fecal Occult Bld: POSITIVE — AB

## 2023-07-06 MED ORDER — SODIUM CHLORIDE 0.9 % IV SOLN: 250.0000 mL | INTRAVENOUS | Status: AC | PRN

## 2023-07-06 MED ORDER — CALCITRIOL 0.25 MCG PO CAPS
0.2500 ug | ORAL_CAPSULE | ORAL | Status: DC
  Administered 2023-07-09: 0.25 ug via ORAL
  Filled 2023-07-06: qty 1

## 2023-07-06 MED ORDER — METOPROLOL SUCCINATE ER 25 MG PO TB24
25.0000 mg | ORAL_TABLET | Freq: Every day | ORAL | Status: DC
  Administered 2023-07-07 – 2023-07-09 (×3): 25 mg via ORAL
  Filled 2023-07-06 (×3): qty 1

## 2023-07-06 MED ORDER — SODIUM CHLORIDE 0.9% IV SOLUTION: Freq: Once | INTRAVENOUS | Status: AC

## 2023-07-06 MED ORDER — PANTOPRAZOLE SODIUM 40 MG IV SOLR
40.0000 mg | Freq: Once | INTRAVENOUS | Status: AC
  Administered 2023-07-06: 40 mg via INTRAVENOUS
  Filled 2023-07-06: qty 10

## 2023-07-06 MED ORDER — PANTOPRAZOLE SODIUM 40 MG IV SOLR: 40.0000 mg | Freq: Two times a day (BID) | INTRAVENOUS | Status: DC

## 2023-07-06 MED ORDER — PANTOPRAZOLE SODIUM 40 MG IV SOLR
40.0000 mg | Freq: Two times a day (BID) | INTRAVENOUS | Status: DC
  Administered 2023-07-07 – 2023-07-09 (×5): 40 mg via INTRAVENOUS
  Filled 2023-07-06 (×5): qty 10

## 2023-07-06 MED ORDER — ACETAMINOPHEN 325 MG PO TABS: 650.0000 mg | ORAL_TABLET | Freq: Four times a day (QID) | ORAL | Status: DC | PRN

## 2023-07-06 MED ORDER — MEMANTINE HCL 10 MG PO TABS
10.0000 mg | ORAL_TABLET | Freq: Two times a day (BID) | ORAL | Status: DC
  Administered 2023-07-07 – 2023-07-09 (×6): 10 mg via ORAL
  Filled 2023-07-06 (×6): qty 1

## 2023-07-06 MED ORDER — ONDANSETRON HCL 4 MG/2ML IJ SOLN: 4.0000 mg | Freq: Four times a day (QID) | INTRAMUSCULAR | Status: DC | PRN

## 2023-07-06 MED ORDER — LEVOTHYROXINE SODIUM 25 MCG PO TABS
25.0000 ug | ORAL_TABLET | Freq: Every morning | ORAL | Status: DC
  Administered 2023-07-07 – 2023-07-09 (×3): 25 ug via ORAL
  Filled 2023-07-06 (×3): qty 1

## 2023-07-06 MED ORDER — INSULIN ASPART 100 UNIT/ML IJ SOLN
0.0000 [IU] | Freq: Every day | INTRAMUSCULAR | Status: DC
  Administered 2023-07-07: 2 [IU] via SUBCUTANEOUS

## 2023-07-06 MED ORDER — INSULIN ASPART 100 UNIT/ML IJ SOLN
0.0000 [IU] | Freq: Three times a day (TID) | INTRAMUSCULAR | Status: DC
  Administered 2023-07-08 (×2): 1 [IU] via SUBCUTANEOUS
  Administered 2023-07-08: 3 [IU] via SUBCUTANEOUS

## 2023-07-06 MED ORDER — ACETAMINOPHEN 650 MG RE SUPP: 650.0000 mg | Freq: Four times a day (QID) | RECTAL | Status: DC | PRN

## 2023-07-06 MED ORDER — ONDANSETRON HCL 4 MG PO TABS: 4.0000 mg | ORAL_TABLET | Freq: Four times a day (QID) | ORAL | Status: DC | PRN

## 2023-07-06 MED ORDER — SODIUM CHLORIDE 0.9% FLUSH: 3.0000 mL | INTRAVENOUS | Status: DC | PRN

## 2023-07-06 MED ORDER — ROSUVASTATIN CALCIUM 20 MG PO TABS
40.0000 mg | ORAL_TABLET | Freq: Every day | ORAL | Status: DC
  Administered 2023-07-07 – 2023-07-09 (×3): 40 mg via ORAL
  Filled 2023-07-06 (×3): qty 2

## 2023-07-06 MED ORDER — SODIUM CHLORIDE 0.9% FLUSH
3.0000 mL | Freq: Two times a day (BID) | INTRAVENOUS | Status: DC
Start: 2023-07-06 — End: 2023-07-09
  Administered 2023-07-07 – 2023-07-09 (×6): 3 mL via INTRAVENOUS

## 2023-07-06 NOTE — ED Provider Notes (Signed)
[place] EMERGENCY DEPARTMENT AT [facility] Provider Note   CSN: [id number] Arrival date & time: 07/06/23  1354     History  No chief complaint on file.   [name] is a 89 y.o. male.  HPI 89 year old male history of heart failure, CKD, CAD, type 2 diabetes presenting for weakness, fatigue, dark stools.  Patient states few days ago he started having dark, black and tarry stools.  He is not sure if he is on iron.  He said she seen here in the ED a few days ago and discharged home.  However he has not been constipated, has not had a bowel movement a couple days.  No vomiting.  He has some mild abdominal pain.  No chest pain or shortness of breath.  He is on baseline oxygen.  No fevers or chills.  He reports history of prior ulcer.  Appears he was admitted back in October and had an EGD that showed nonbleeding ulcer.  He is on aspirin and Plavix.     Home Medications Prior to Admission medications   Medication Sig Start Date End Date Taking? Authorizing Provider  acetaminophen (TYLENOL) 500 MG tablet Take 500 mg by mouth 2 (two) times daily as needed for moderate pain.    [provider]  bisacodyl (DULCOLAX) 5 MG EC tablet Take 5 mg by mouth daily as needed for severe constipation.    [provider]  calcitRIOL (ROCALTROL) 0.25 MCG capsule Take 0.25 mcg by mouth 2 (two) times a week. 02/05/23   [provider]  clopidogrel (PLAVIX) 75 MG tablet Take 1 tablet (75 mg total) by mouth daily. Restart 10/15 03/04/23   Standley Brooking, MD  Coenzyme Q10 (COQ10) 150 MG CAPS Take 150 mg by mouth every evening.    [provider]  Continuous Glucose Sensor (FREESTYLE LIBRE 3 SENSOR) MISC Apply topically.    [provider]  docusate sodium (COLACE) 100 MG capsule Take 100 mg by mouth daily.    [provider]  Iron, Ferrous Sulfate, 325 (65 Fe) MG TABS Take 325 mg by mouth daily. 03/04/23   Standley Brooking, MD   levothyroxine (SYNTHROID, LEVOTHROID) 25 MCG tablet Take 25 mcg by mouth every morning. 02/09/14   [provider]  memantine (NAMENDA) 10 MG tablet Take 1 tablet (10 mg total) by mouth 2 (two) times daily. 10/19/22   Marcos Eke, PA-C  metoprolol succinate (TOPROL XL) 25 MG 24 hr tablet Take 1 tablet (25 mg total) by mouth daily. 02/20/23   Croitoru, Mihai, MD  Multiple Vitamin (RENAL MULTIVITAMIN/ZINC PO) Take 1 tablet by mouth every evening.    [provider]  nitroGLYCERIN (NITROSTAT) 0.4 MG SL tablet DISSOLVE ONE TABLET UNDER THE TONGUE EVERY 5 MINUTES AS NEEDED FOR CHEST PAIN. 03/13/22   Croitoru, Mihai, MD  NOVOLIN 70/30 KWIKPEN (70-30) 100 UNIT/ML KwikPen Inject into the skin 2 (two) times daily. Takes 50 units in the morning and 13 units at night. 04/25/22   [provider]  OVER THE COUNTER MEDICATION Take 2 tablets by mouth in the morning. Emma OTC medication    [provider]  pantoprazole (PROTONIX) 40 MG tablet Take 1 tablet (40 mg total) by mouth 2 (two) times daily. 03/04/23   Standley Brooking, MD  polyethylene glycol (MIRALAX / GLYCOLAX) 17 g packet Take 17 g by mouth daily. 08/25/21   Arby Barrette, MD  rosuvastatin (CRESTOR) 40 MG tablet Take 1 tablet by mouth  once daily 06/27/23   Denyce Robert, NP      Allergies    Patient has no known allergies.    Review of Systems   Review of Systems Review of systems completed and notable as per HPI.  ROS otherwise negative.   Physical Exam Updated Vital Signs BP (!) 115/104 (BP Location: Right Arm)   Pulse 66   Temp 98.4 F (36.9 C) (Oral)   Resp 15   SpO2 100%  Physical Exam Vitals and nursing note reviewed. Exam conducted with a chaperone present.  Constitutional:      General: He is not in acute distress.    Appearance: He is well-developed.  HENT:     Head: Normocephalic and atraumatic.     Nose: Nose normal.     Mouth/Throat:     Mouth: Mucous membranes are moist.      Pharynx: Oropharynx is clear.  Eyes:     Extraocular Movements: Extraocular movements intact.     Conjunctiva/sclera: Conjunctivae normal.     Pupils: Pupils are equal, round, and reactive to light.  Cardiovascular:     Rate and Rhythm: Normal rate and regular rhythm.     Pulses: Normal pulses.     Heart sounds: Normal heart sounds. No murmur heard. Pulmonary:     Effort: Pulmonary effort is normal. No respiratory distress.     Breath sounds: Normal breath sounds.  Abdominal:     Palpations: Abdomen is soft.     Tenderness: There is no abdominal tenderness. There is no guarding or rebound.  Genitourinary:    Comments: Gross melena on rectal exam Musculoskeletal:        General: No swelling.     Cervical back: Neck supple.     Right lower leg: No edema.     Left lower leg: No edema.  Skin:    General: Skin is warm and dry.     Capillary Refill: Capillary refill takes less than 2 seconds.  Neurological:     General: No focal deficit present.     Mental Status: He is alert and oriented to person, place, and time. Mental status is at baseline.  Psychiatric:        Mood and Affect: Mood normal.     ED Results / Procedures / Treatments   Labs (all labs ordered are listed, but only abnormal results are displayed) Labs Reviewed  CBC WITH DIFFERENTIAL/PLATELET - Abnormal; Notable for the following components:      Result Value   RBC 2.56 (*)    Hemoglobin 7.2 (*)    HCT 23.4 (*)    RDW 19.4 (*)    All other components within normal limits  COMPREHENSIVE METABOLIC PANEL - Abnormal; Notable for the following components:   Glucose, Bld 105 (*)    BUN 64 (*)    Creatinine, Ser 2.29 (*)    Total Protein 6.1 (*)    Albumin 3.2 (*)    GFR, Estimated 27 (*)    All other components within normal limits  URINALYSIS, ROUTINE W REFLEX MICROSCOPIC - Abnormal; Notable for the following components:   APPearance HAZY (*)    All other components within normal limits  POC OCCULT BLOOD, ED  - Abnormal; Notable for the following components:   Fecal Occult Bld POSITIVE (*)    All other components within normal limits  POC OCCULT BLOOD, ED  TYPE AND SCREEN    EKG None  Radiology DG Chest Port 1 View Result Date:  07/06/2023 CLINICAL DATA:  Shortness of breath and weakness EXAM: PORTABLE CHEST 1 VIEW COMPARISON:  07/03/2023 FINDINGS: Cardiac shadow is enlarged but stable. Pacing device is again seen and stable. Overall inspiratory effort is poor. Fiducial markers are noted in the right lung. No focal confluent infiltrate is seen. IMPRESSION: No acute abnormality noted. Electronically Signed   By: Alcide Clever M.D.   On: 07/06/2023 21:29   CT ABDOMEN PELVIS WO CONTRAST Result Date: 07/06/2023 CLINICAL DATA:  Acute nonlocalized abdominal pain EXAM: CT ABDOMEN AND PELVIS WITHOUT CONTRAST TECHNIQUE: Multidetector CT imaging of the abdomen and pelvis was performed following the standard protocol without IV contrast. RADIATION DOSE REDUCTION: This exam was performed according to the departmental dose-optimization program which includes automated exposure control, adjustment of the mA and/or kV according to patient size and/or use of iterative reconstruction technique. COMPARISON:  None Available. FINDINGS: Lower chest: No acute abnormality. Extensive multi-vessel coronary artery calcification. Pacemaker leads are seen within the right heart. Hypoattenuation of the cardiac blood pool is in keeping with at least mild anemia. Hepatobiliary: Cholelithiasis without pericholecystic inflammatory change. Liver unremarkable on this noncontrast examination. No intra or extrahepatic biliary ductal dilation. Pancreas: Punctate calcifications throughout the pancreatic parenchyma are in keeping with changes of pancreatitis. No superimposed acute peripancreatic inflammatory changes or loculated peripancreatic fluid collections. The pancreatic duct is not dilated. Spleen: Unremarkable Adrenals/Urinary Tract: The  adrenal glands are unremarkable. The kidneys are unremarkable on this noncontrast examination. Bladder unremarkable. Stomach/Bowel: Mild sigmoid diverticulosis. Stomach, small bowel, and large bowel are otherwise unremarkable. Appendix normal. No free intraperitoneal gas or fluid. Vascular/Lymphatic: Aortic atherosclerosis. No enlarged abdominal or pelvic lymph nodes. Reproductive: Brief there sees are seen within the prostate Other: Small fat containing umbilical hernia Musculoskeletal: Advanced degenerative changes are seen throughout the visualized thoracolumbar spine. No acute bone abnormality. No lytic or blastic bone lesion IMPRESSION: 1. No acute intra-abdominal pathology identified. No definite radiographic explanation for the patient's reported symptoms. 2. Extensive multi-vessel coronary artery calcification. 3. Cholelithiasis. 4. Mild sigmoid diverticulosis. 5. Changes of chronic pancreatitis. No superimposed acute peripancreatic inflammatory changes or loculated peripancreatic fluid collections. Aortic Atherosclerosis (ICD10-I70.0). Electronically Signed   By: Helyn Numbers M.D.   On: 07/06/2023 20:17    Procedures Procedures    Medications Ordered in ED Medications  pantoprazole (PROTONIX) injection 40 mg (40 mg Intravenous Given 07/06/23 2123)    ED Course/ Medical Decision Making/ A&P Clinical Course as of 07/06/23 2318  Fri Jul 06, 2023  2025 Gross melena on rectal [JD]  2317 Discussed with hospitalist for admission. [JD]    Clinical Course User Index [JD] Laurence Spates, MD                                 Medical Decision Making Amount and/or Complexity of Data Reviewed Radiology: ordered.  Risk Prescription drug management. Decision regarding hospitalization.   Medical Decision Making:   [name] is a 89 y.o. male who presented to the ED today with blood in his stool, abdominal pain.  All signs reviewed.  On exam patient's abdomen is soft, nontender.  He  does report some pain and he has gross melena on rectal exam.  He has no ulcer with recent EGD that showed no bleeding, I am concerned he is likely having upper GI bleeding at this point.  Protonix is ordered for hemoglobin of 7.2, down about a point a half from a few  days ago.  He is hemodynamically stable, I think he would need to come in especially given he is on multiple antiplatelet agents for trending hemoglobin, PPI, possible gastroenterology consultation and repeat scope.   Patient placed on continuous vitals and telemetry monitoring while in ED which was reviewed periodically.  Reviewed and confirmed nursing documentation for past medical history, family history, social history.  Reassessment and Plan:   Patient remained stable.  Discussed with hospitalist and admitted.   Patient's presentation is most consistent with acute complicated illness / injury requiring diagnostic workup.           Final Clinical Impression(s) / ED Diagnoses Final diagnoses:  Upper GI bleeding    Rx / DC Orders ED Discharge Orders     None         Laurence Spates, MD 07/06/23 2318

## 2023-07-06 NOTE — H&P (Addendum)
History and Physical    [name] QIO:[id number] DOB: [birth year] DOA: 07/06/2023  PCP: Mila Palmer, MD   Patient coming from: Home   Chief Complaint: Multiple complaint include low hemoglobin, black stool on Monday, dizziness, and shortness of breath get worse with movement.  Endorse generalized abdominal pain  ED TRIAGE note:  Per family, pt had drawn labs Wednesday, hgb 7.1, down from 8.5 on Monday; endorses black stools on Monday, states has not had a BM since; hx same in October; pt on Plavix, hx of stroke; pt endorses sob and dizziness, worse with movement but resolves at rest; endorses generalized abd pain, tender with palpation; denies emesis      HPI:  [name] is a 89 y.o. male with medical history significant of upper GI bleed secondary to ulcer underwent EGD in 02/2023, complete heart block status post pacemaker placement in 1990 and generator change in 2020, combined systolic and diastolic heart failure with reduced EF less than 40%, history of CAD currently on Plavix only since 03/11/2023, CVA, insulin-dependent DM type II, hypothyroidism, CKD stage IV, hyperlipidemia presented emergency department complaining of darker stool on Monday afterward there is no bowel movement, abdominal pain, dyspnea on exertion, and dropping of hemoglobin 7.1 from 8.5.  Patient reported that he has dyspnea and orthopnea at the baseline and uses oxygen most of the time at home.  Patient reported that he was in the emergency department 2 days ago and complaining about that every time he come to the hospital he develops GI bleed.  Patient is complaining about midepigastric and bilateral lower abdominal pain.  Denies any nausea and vomiting.  Patient denies any hematemesis. Denies any chest pain, palpitation, dizziness, vertigo, presyncope, syncope, headache and blurry vision.   ED Course:  At presentation to ED patient is hemodynamically stable. FOBT positive with blood. CMP showed  elevated creatinine 2.29, elevated BUN 6.4, GFR 27.  Renal function at baseline.  Low albumin 3.2 otherwise unremarkable findings. CBC showing H&H 7.2 and 23, baseline hemoglobin around 8-9.5.  Normal platelet count 170.  CT abdomen pelvis without contrast: IMPRESSION: 1. No acute intra-abdominal pathology identified. No definite radiographic explanation for the patient's reported symptoms. 2. Extensive multi-vessel coronary artery calcification. 3. Cholelithiasis. 4. Mild sigmoid diverticulosis. 5. Changes of chronic pancreatitis. No superimposed acute peripancreatic inflammatory changes or loculated peripancreatic fluid collections.  Chest x-ray no acute cardiopulmonary process.  In the ED patient has been given Protonix 40 mg.  Dr. Earlene Plater stated that physical exam showed gross melanoma/black tarry stool in the rectal vault.  Hospitalist has been contacted for further evaluation management of lower GI bleed/melena.      Significant labs in the ED: Lab Orders         CBC with Differential         Comprehensive metabolic panel         Urinalysis, Routine w reflex microscopic -Urine, Clean Catch         Hemoglobin and hematocrit, blood         Comprehensive metabolic panel         CBC         Lactic acid, plasma         Brain natriuretic peptide         POC occult blood, ED         POC occult blood, ED         CBG monitoring, ED       Review of Systems:  Review of Systems  Constitutional:  Negative for chills, fever, malaise/fatigue and weight loss.  Eyes:  Negative for blurred vision.  Respiratory:  Positive for sputum production. Negative for cough.   Cardiovascular:  Positive for orthopnea and leg swelling. Negative for chest pain, palpitations and PND.  Gastrointestinal:  Positive for abdominal pain and melena. Negative for blood in stool, constipation, diarrhea, heartburn, nausea and vomiting.  Genitourinary:  Negative for dysuria.  Musculoskeletal:  Negative for  back pain, myalgias and neck pain.  Neurological:  Negative for dizziness and headaches.  Psychiatric/Behavioral:  The patient is not nervous/anxious.     Past Medical History:  Diagnosis Date   Abnormal Doppler ultrasound of carotid artery 12/16/2012   mildly abnormal doppler . no prior studies to compare to. follow up studies are recommended when clinically indicated.   Acute respiratory failure with hypoxia 11/2018   Blepharitis    CAD (coronary artery disease) 07/24/2013   Previously placed drug-eluting stents in the LAD and RCA in 2005, low risk perfusion study December 2011 in January 2014   Cardiomyopathy, ischemic 07/24/2013   EF 45-50% echo March 2014   Carotid stenosis, left 11/2018   on left 60%   Cerebrovascular disease 03/24/2022   CHB (complete heart block) 08/27/2019   Chronic combined systolic and diastolic heart failure 02/19/2015   CKD (chronic kidney disease) stage 3, GFR 30-59 ml/min 07/24/2013   Diabetic hypoglycemia 05/02/2021   Diabetic polyneuropathy 05/02/2021   Gastric ulcer    GI bleed    History of cancer    History of echocardiogram 08/15/2012   EF  45-50 % very difficult study, severe LVH, pacemaker, trace TR,mildly enlarged RV,; aorta mildly calcified   History of stress test 06/06/2012   low risk scan. fixed inferior defect consistant with prior infarction with moderatly reduced EF. no appreciable evidence of ischemia. 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 DDDR ; this is the third generator this pt. has had   Pain due to onychomycosis of toenails of both feet 11/13/2018   Pain in joint of right shoulder 09/15/2021   Second degree AV block 03/09/2014   Sensorineural hearing loss  (SNHL) of both ears 04/08/2018   Shingles    Sinus node dysfunction 03/09/2014   TIA (transient ischemic attack) 11/2018   vs stroke not seen on CT   Tinnitus, bilateral 04/08/2018   Type II diabetes mellitus     Past Surgical History:  Procedure Laterality Date   BIOPSY  03/02/2023   Procedure: BIOPSY;  Surgeon: Vida Rigger, MD;  Location: [facility];  Service: Gastroenterology;;   CARDIAC CATHETERIZATION  12-29-2003   rt. coronary angiography was done with a 6 french 4 cm taper coronary cath. This demonstrated  approximately 70 to 75% mildly segmental stenosis in the mid RCA before the acute margin           CARDIAC CATHETERIZATION  11-19-2003   diagnostic cath,, pt. to get second cath to have stent placed   cardiac stents     CATARACT EXTRACTION     ESOPHAGOGASTRODUODENOSCOPY (EGD) WITH PROPOFOL N/A 03/02/2023   Procedure: ESOPHAGOGASTRODUODENOSCOPY (EGD) WITH PROPOFOL;  Surgeon: Vida Rigger, MD;  Location: [facility];  Service: Gastroenterology;  Laterality:  N/A;   KNEE SURGERY Right    PPM GENERATOR CHANGEOUT N/A 04/07/2019   Procedure: PPM GENERATOR CHANGEOUT;  Surgeon: Thurmon Fair, MD;  Location: [facility];  Service: Cardiovascular;  Laterality: N/A;   PROSTATE BIOPSY     prostate seed implants     SPINE SURGERY     WISDOM TOOTH EXTRACTION       reports that he has never smoked. He has quit using smokeless tobacco. He reports current alcohol use. He reports that he does not use drugs.  No Known Allergies  Family History  Problem Relation Age of Onset   Memory loss Mother        with very advanced age   Heart disease Father     Prior to Admission medications   Medication Sig Start Date End Date Taking? Authorizing Provider  acetaminophen (TYLENOL) 500 MG tablet Take 500 mg by mouth 2 (two) times daily as needed for moderate pain.    [provider]  bisacodyl (DULCOLAX) 5 MG EC tablet Take 5 mg by mouth daily as needed for severe constipation.     [provider]  calcitRIOL (ROCALTROL) 0.25 MCG capsule Take 0.25 mcg by mouth 2 (two) times a week. 02/05/23   [provider]  clopidogrel (PLAVIX) 75 MG tablet Take 1 tablet (75 mg total) by mouth daily. Restart 10/15 03/04/23   Standley Brooking, MD  Coenzyme Q10 (COQ10) 150 MG CAPS Take 150 mg by mouth every evening.    [provider]  Continuous Glucose Sensor (FREESTYLE LIBRE 3 SENSOR) MISC Apply topically.    [provider]  docusate sodium (COLACE) 100 MG capsule Take 100 mg by mouth daily.    [provider]  Iron, Ferrous Sulfate, 325 (65 Fe) MG TABS Take 325 mg by mouth daily. 03/04/23   Standley Brooking, MD  levothyroxine (SYNTHROID, LEVOTHROID) 25 MCG tablet Take 25 mcg by mouth every morning. 02/09/14   [provider]  memantine (NAMENDA) 10 MG tablet Take 1 tablet (10 mg total) by mouth 2 (two) times daily. 10/19/22   Marcos Eke, PA-C  metoprolol succinate (TOPROL XL) 25 MG 24 hr tablet Take 1 tablet (25 mg total) by mouth daily. 02/20/23   Croitoru, Mihai, MD  Multiple Vitamin (RENAL MULTIVITAMIN/ZINC PO) Take 1 tablet by mouth every evening.    [provider]  nitroGLYCERIN (NITROSTAT) 0.4 MG SL tablet DISSOLVE ONE TABLET UNDER THE TONGUE EVERY 5 MINUTES AS NEEDED FOR CHEST PAIN. 03/13/22   Croitoru, Mihai, MD  NOVOLIN 70/30 KWIKPEN (70-30) 100 UNIT/ML KwikPen Inject into the skin 2 (two) times daily. Takes 50 units in the morning and 13 units at night. 04/25/22   [provider]  OVER THE COUNTER MEDICATION Take 2 tablets by mouth in the morning. Emma OTC medication    [provider]  pantoprazole (PROTONIX) 40 MG tablet Take 1 tablet (40 mg total) by mouth 2 (two) times daily. 03/04/23   Standley Brooking, MD  polyethylene glycol (MIRALAX / GLYCOLAX) 17 g packet Take 17 g by mouth daily. 08/25/21   Arby Barrette, MD  rosuvastatin (CRESTOR) 40 MG tablet Take 1 tablet by mouth once daily  06/27/23   Denyce Robert, NP     Physical Exam: Vitals:   07/06/23 2122 07/06/23 2330 07/07/23 0016 07/07/23 0030  BP: (!) 115/104 139/76 (!) 124/53 (!) 128/50  Pulse: 66 78 70 73  Resp: 15 16 (!) 21 (!) 22  Temp:  98.4 F (36.9 C)  98 F (36.7 C) 97.9 F (36.6 C)  TempSrc: Oral  Oral Oral  SpO2: 100% 100%  100%    Physical Exam Vitals and nursing note reviewed.  Constitutional:      Appearance: He is not ill-appearing.  HENT:     Nose: Nose normal.     Mouth/Throat:     Mouth: Mucous membranes are moist.  Eyes:     Conjunctiva/sclera: Conjunctivae normal.  Cardiovascular:     Rate and Rhythm: Normal rate and regular rhythm.     Pulses: Normal pulses.     Heart sounds: Normal heart sounds.  Pulmonary:     Effort: Pulmonary effort is normal.     Breath sounds: Normal breath sounds.  Abdominal:     General: Bowel sounds are normal. There is no distension.     Tenderness: There is no abdominal tenderness. There is no guarding or rebound.  Musculoskeletal:     Cervical back: Normal range of motion and neck supple.     Right lower leg: No edema.     Left lower leg: No edema.  Skin:    Capillary Refill: Capillary refill takes less than 2 seconds.  Neurological:     Mental Status: He is alert and oriented to person, place, and time.  Psychiatric:        Thought Content: Thought content normal.      Labs on Admission: I have personally reviewed following labs and imaging studies  CBC: Recent Labs  Lab 07/03/23 0924 07/06/23 1609  WBC 13.2* 9.1  NEUTROABS 10.7* 6.2  HGB 8.5* 7.2*  HCT 27.4* 23.4*  MCV 90.4 91.4  PLT 152 170   Basic Metabolic Panel: Recent Labs  Lab 07/03/23 0924 07/06/23 1609  NA 136 140  K 4.9 4.3  CL 104 104  CO2 23 27  GLUCOSE 223* 105*  BUN 118* 64*  CREATININE 2.49* 2.29*  CALCIUM 9.0 9.0   GFR: Estimated Creatinine Clearance: 25.3 mL/min (A) (by C-G formula based on SCr of 2.29 mg/dL (H)). Liver Function Tests: Recent  Labs  Lab 07/03/23 0924 07/06/23 1609  AST 18 30  ALT 13 21  ALKPHOS 37* 40  BILITOT 0.5 0.5  PROT 5.8* 6.1*  ALBUMIN 3.0* 3.2*   No results for input(s): "LIPASE", "AMYLASE" in the last 168 hours. No results for input(s): "AMMONIA" in the last 168 hours. Coagulation Profile: No results for input(s): "INR", "PROTIME" in the last 168 hours. Cardiac Enzymes: Recent Labs  Lab 07/03/23 0924 07/03/23 1200  TROPONINIHS 22* 23*   BNP (last 3 results) No results for input(s): "BNP" in the last 8760 hours. HbA1C: No results for input(s): "HGBA1C" in the last 72 hours. CBG: Recent Labs  Lab 07/07/23 0040  GLUCAP 55*   Lipid Profile: No results for input(s): "CHOL", "HDL", "LDLCALC", "TRIG", "CHOLHDL", "LDLDIRECT" in the last 72 hours. Thyroid Function Tests: No results for input(s): "TSH", "T4TOTAL", "FREET4", "T3FREE", "THYROIDAB" in the last 72 hours. Anemia Panel: No results for input(s): "VITAMINB12", "FOLATE", "FERRITIN", "TIBC", "IRON", "RETICCTPCT" in the last 72 hours. Urine analysis:    Component Value Date/Time   COLORURINE YELLOW 07/06/2023 1601   APPEARANCEUR HAZY (A) 07/06/2023 1601   LABSPEC 1.014 07/06/2023 1601   PHURINE 5.0 07/06/2023 1601   GLUCOSEU NEGATIVE 07/06/2023 1601   HGBUR NEGATIVE 07/06/2023 1601   BILIRUBINUR NEGATIVE 07/06/2023 1601   KETONESUR NEGATIVE 07/06/2023 1601   PROTEINUR NEGATIVE 07/06/2023 1601   UROBILINOGEN 0.2 06/29/2010 0930  NITRITE NEGATIVE 07/06/2023 1601   LEUKOCYTESUR NEGATIVE 07/06/2023 1601    Radiological Exams on Admission: I have personally reviewed images DG Chest Port 1 View Result Date: 07/06/2023 CLINICAL DATA:  Shortness of breath and weakness EXAM: PORTABLE CHEST 1 VIEW COMPARISON:  07/03/2023 FINDINGS: Cardiac shadow is enlarged but stable. Pacing device is again seen and stable. Overall inspiratory effort is poor. Fiducial markers are noted in the right lung. No focal confluent infiltrate is seen.  IMPRESSION: No acute abnormality noted. Electronically Signed   By: Alcide Clever M.D.   On: 07/06/2023 21:29   CT ABDOMEN PELVIS WO CONTRAST Result Date: 07/06/2023 CLINICAL DATA:  Acute nonlocalized abdominal pain EXAM: CT ABDOMEN AND PELVIS WITHOUT CONTRAST TECHNIQUE: Multidetector CT imaging of the abdomen and pelvis was performed following the standard protocol without IV contrast. RADIATION DOSE REDUCTION: This exam was performed according to the departmental dose-optimization program which includes automated exposure control, adjustment of the mA and/or kV according to patient size and/or use of iterative reconstruction technique. COMPARISON:  None Available. FINDINGS: Lower chest: No acute abnormality. Extensive multi-vessel coronary artery calcification. Pacemaker leads are seen within the right heart. Hypoattenuation of the cardiac blood pool is in keeping with at least mild anemia. Hepatobiliary: Cholelithiasis without pericholecystic inflammatory change. Liver unremarkable on this noncontrast examination. No intra or extrahepatic biliary ductal dilation. Pancreas: Punctate calcifications throughout the pancreatic parenchyma are in keeping with changes of pancreatitis. No superimposed acute peripancreatic inflammatory changes or loculated peripancreatic fluid collections. The pancreatic duct is not dilated. Spleen: Unremarkable Adrenals/Urinary Tract: The adrenal glands are unremarkable. The kidneys are unremarkable on this noncontrast examination. Bladder unremarkable. Stomach/Bowel: Mild sigmoid diverticulosis. Stomach, small bowel, and large bowel are otherwise unremarkable. Appendix normal. No free intraperitoneal gas or fluid. Vascular/Lymphatic: Aortic atherosclerosis. No enlarged abdominal or pelvic lymph nodes. Reproductive: Brief there sees are seen within the prostate Other: Small fat containing umbilical hernia Musculoskeletal: Advanced degenerative changes are seen throughout the visualized  thoracolumbar spine. No acute bone abnormality. No lytic or blastic bone lesion IMPRESSION: 1. No acute intra-abdominal pathology identified. No definite radiographic explanation for the patient's reported symptoms. 2. Extensive multi-vessel coronary artery calcification. 3. Cholelithiasis. 4. Mild sigmoid diverticulosis. 5. Changes of chronic pancreatitis. No superimposed acute peripancreatic inflammatory changes or loculated peripancreatic fluid collections. Aortic Atherosclerosis (ICD10-I70.0). Electronically Signed   By: Helyn Numbers M.D.   On: 07/06/2023 20:17     EKG: EKG shows sinus rhythm heart rate 77.  Multiple ventricular premature complex which is unchanged as compared to previous EKG from 07/04/2023   Assessment/Plan: Principal Problem:   Melena Active Problems:   History of gastric ulcer   Acute on chronic anemia   Mixed hyperlipidemia   Hypothyroidism   Insulin dependent type 2 diabetes mellitus (HCC)   CKD (chronic kidney disease), stage IV (HCC)   History of CAD (coronary artery disease)   History of CVA (cerebrovascular accident)    Assessment and Plan: Melena History of GI bleed in the past History of peptic ulcer disease -Patient presented with complaining of black tarry stool on Monday afterward did not have any another episode and no further bowel movement. -Presentation to ED patient is hemodynamically stable. - CBC showed hemoglobin 7.2.  Baseline hemoglobin around 8.5-9.5.  FOBT positive. - Physical exam showed gross melena/black tarry stool in the rectal vault. -CT abdomen pelvis showed no acute intracranial pathology.  Extensive multilevel vessel coronary artery disease. -Given patient has history of significant CAD and having an episodes of melena  there could be risk of development of ischemic bowel however CT scan did not showed any bowel wall thickening which could reflect ischemic bowel disease. -Checking lactic acid level. - Patient had episodes of GI  bleed in October 2024.  Underwent EGD which showed nonbleeding GI ulcer. - In the ED patient has been given Protonix 40 mg IV - Continue Protonix 40 mg IV twice daily - Preparing 2 units of blood and transfusing 1 unit of blood now. -Holding Plavix in the setting of GI bleed. -Continue monitor H&H and transfuse as needed to give hemoglobin above 8 given patient has multiple comorbidities include history of CAD and CKD stage IV. - Consulted [facility] Dr. Elnoria Howard and sent secure chat message. -Continue clear liquid diet. Will follow-up with formal GI recommendation.   Acute on chronic anemia Anemia of chronic disease secondary to CKD stage IV -Acute on chronic anemia in the setting of GI bleed. - Continue monitor H&H and and transfuse as needed and plan as mentioned above.   Hypothyroidism -Continue levothyroxine.  Third-degree AV block s/p pacemaker placement 1990 and last generator change in 2020. -Patient has history of complete AV block. -Awaiting for pharmacy verification if patient is still taking Toprol-XL.  Unable to find in the record on the outpatient cardiology note.  Chronic systolic and diastolic heart failure reduced EF 40 to 45% Essential hypertension History of blood pressure regimen intolerance due to hypotension History of chronic dyspnea use oxygen at baseline -Chest x-ray no acute cardiopulmonary process. Currently he is not on any blood pressure regimen at home however he has history of chronic dyspnea and uses portable oxygen.  Physical exam showing bilateral lower extremities trace edema.  Chest x-ray no evidence of acute pulmonary vascular congestion.  Enlarged cardiac silhouette. -Checking BNP. -Continue Toprol-XL 25 mg daily -As patient has combined systolic diastolic heart failure reduced EF 40 to 45% he supposed to be on GDMT guided medication however patient had history of blood pressure regimen intolerance due to hypotension and previously being on midodrine  for a while. -Holding to initiate any ACE inhibitor/ARB/SGLT2 inhibitor/MRA inhibitor in the setting of history of chronic hypotension.   Chronic dyspnea - Patient uses a portable oxygen cylinder at the baseline.  Insulin-dependent DM type II -Starting clear liquid diet.  Continue low sliding scale SSI and at bedtime insulin coverage. - Can resume Novolin once solid food would be initiated.   History of CAD -Holding Plavix in the setting of GI bleed. -Continue Crestor and Toprol-XL 25 mg daily.  History of CVA - Holding Plavix in the setting of GI bleed. - Continue Crestor.  History of memory impairment - Continue memantine 10 mg twice daily.  DVT prophylaxis:  SCDs.  In the setting of GI bleed holding pharmacological DVT prophylaxis Code Status:  Full Code Diet: Clear liquid diet Family Communication:   No one present at the bedside now Disposition Plan: Continue monitor for GI bleed, trend H&H, transfuse as needed to keep hemoglobin above 8.  Will follow-up with gastroenterology recommendation. Consults: Gastroenterology Admission status:   Inpatient, Step Down Unit  Severity of Illness: The appropriate patient status for this patient is INPATIENT. Inpatient status is judged to be reasonable and necessary in order to provide the required intensity of service to ensure the patient's safety. The patient's presenting symptoms, physical exam findings, and initial radiographic and laboratory data in the context of their chronic comorbidities is felt to place them at high risk for further clinical deterioration. Furthermore, it  is not anticipated that the patient will be medically stable for discharge from the hospital within 2 midnights of admission.   * I certify that at the point of admission it is my clinical judgment that the patient will require inpatient hospital care spanning beyond 2 midnights from the point of admission due to high intensity of service, high risk for further  deterioration and high frequency of surveillance required.Marland Kitchen    Tereasa Coop, MD [facility]  How to contact the [facility] Attending or Consulting provider 7A - 7P or covering provider during after hours 7P -7A, for this patient.  Check the care team in [facility] and look for a) attending/consulting TR[facility] provider listed and b) the [facility] team listed Log into [email or web address] and use [facility] universal password to access. If you do not have the password, please contact the hospital operator. Locate the [facility] provider you are looking for under [facility] and page to a number that you can be directly reached. If you still have difficulty reaching the provider, please page the [facility] (Director on Call) for the Hospitalists listed on amion for assistance.  07/07/2023, 1:04 AM

## 2023-07-06 NOTE — ED Provider Triage Note (Signed)
Emergency Medicine Provider Triage Evaluation Note  [name] , a 89 y.o. male  was evaluated in triage.  Pt complains of anemia.  Patient reports feeling wobbly with walking since last weekend.  Went to his primary care on Monday and his hemoglobin was 8.5.  Was redrawn on Wednesday and it was 7.1.  Had black stools at the beginning the week has been constipated since Wednesday.  Has associated abdominal tenderness which she associates with constipation.  Review of Systems  Positive: Abdominal tenderness, anemia Negative: Lightheadedness, head injury  Physical Exam  BP (!) 115/47   Pulse 68   Temp (!) 97.3 F (36.3 C)   Resp 18   SpO2 100%  Gen:   Awake, no distress   Resp:  Normal effort  MSK:   Moves extremities without difficulty  Other:    Medical Decision Making  Medically screening exam initiated at 3:51 PM.  Appropriate orders placed.  [name] was informed that the remainder of the evaluation will be completed by another provider, this initial triage assessment does not replace that evaluation, and the importance of remaining in the ED until their evaluation is complete.   Maxwell Marion, PA-C 07/06/23 1552

## 2023-07-06 NOTE — ED Triage Notes (Signed)
Per family, pt had drawn labs Wednesday, hgb 7.1, down from 8.5 on Monday; endorses black stools on Monday, states has not had a BM since; hx same in October; pt on Plavix, hx of stroke; pt endorses sob and dizziness, worse with movement but resolves at rest; endorses generalized abd pain, tender with palpation; denies emesis

## 2023-07-07 DIAGNOSIS — K921 Melena: Secondary | ICD-10-CM | POA: Diagnosis not present

## 2023-07-07 DIAGNOSIS — E039 Hypothyroidism, unspecified: Secondary | ICD-10-CM

## 2023-07-07 DIAGNOSIS — Z8679 Personal history of other diseases of the circulatory system: Secondary | ICD-10-CM | POA: Diagnosis not present

## 2023-07-07 DIAGNOSIS — Z8711 Personal history of peptic ulcer disease: Secondary | ICD-10-CM | POA: Diagnosis not present

## 2023-07-07 DIAGNOSIS — D649 Anemia, unspecified: Secondary | ICD-10-CM | POA: Diagnosis not present

## 2023-07-07 LAB — CBG MONITORING, ED
Glucose-Capillary: 107 mg/dL — ABNORMAL HIGH (ref 70–99)
Glucose-Capillary: 156 mg/dL — ABNORMAL HIGH (ref 70–99)
Glucose-Capillary: 55 mg/dL — ABNORMAL LOW (ref 70–99)
Glucose-Capillary: 92 mg/dL (ref 70–99)

## 2023-07-07 LAB — COMPREHENSIVE METABOLIC PANEL
ALT: 20 U/L (ref 0–44)
AST: 26 U/L (ref 15–41)
Albumin: 3 g/dL — ABNORMAL LOW (ref 3.5–5.0)
Alkaline Phosphatase: 40 U/L (ref 38–126)
Anion gap: 11 (ref 5–15)
BUN: 58 mg/dL — ABNORMAL HIGH (ref 8–23)
CO2: 25 mmol/L (ref 22–32)
Calcium: 8.6 mg/dL — ABNORMAL LOW (ref 8.9–10.3)
Chloride: 104 mmol/L (ref 98–111)
Creatinine, Ser: 2.26 mg/dL — ABNORMAL HIGH (ref 0.61–1.24)
GFR, Estimated: 27 mL/min — ABNORMAL LOW (ref >60)
Glucose, Bld: 94 mg/dL (ref 70–99)
Potassium: 4 mmol/L (ref 3.5–5.1)
Sodium: 140 mmol/L (ref 135–145)
Total Bilirubin: 0.7 mg/dL (ref 0.0–1.2)
Total Protein: 5.6 g/dL — ABNORMAL LOW (ref 6.5–8.1)

## 2023-07-07 LAB — HEMOGLOBIN AND HEMATOCRIT, BLOOD
HCT: 27.5 % — ABNORMAL LOW (ref 39.0–52.0)
HCT: 29.1 % — ABNORMAL LOW (ref 39.0–52.0)
Hemoglobin: 8.7 g/dL — ABNORMAL LOW (ref 13.0–17.0)
Hemoglobin: 9.4 g/dL — ABNORMAL LOW (ref 13.0–17.0)

## 2023-07-07 LAB — CBC
HCT: 26.1 % — ABNORMAL LOW (ref 39.0–52.0)
Hemoglobin: 8 g/dL — ABNORMAL LOW (ref 13.0–17.0)
MCH: 27.8 pg (ref 26.0–34.0)
MCHC: 30.7 g/dL (ref 30.0–36.0)
MCV: 90.6 fL (ref 80.0–100.0)
Platelets: 145 10*3/uL — ABNORMAL LOW (ref 150–400)
RBC: 2.88 MIL/uL — ABNORMAL LOW (ref 4.22–5.81)
RDW: 18.6 % — ABNORMAL HIGH (ref 11.5–15.5)
WBC: 9.2 10*3/uL (ref 4.0–10.5)
nRBC: 0 % (ref 0.0–0.2)

## 2023-07-07 LAB — BRAIN NATRIURETIC PEPTIDE: B Natriuretic Peptide: 125.6 pg/mL — ABNORMAL HIGH (ref 0.0–100.0)

## 2023-07-07 LAB — GLUCOSE, CAPILLARY
Glucose-Capillary: 175 mg/dL — ABNORMAL HIGH (ref 70–99)
Glucose-Capillary: 239 mg/dL — ABNORMAL HIGH (ref 70–99)

## 2023-07-07 LAB — LACTIC ACID, PLASMA: Lactic Acid, Venous: 0.9 mmol/L (ref 0.5–1.9)

## 2023-07-07 MED ORDER — POLYETHYLENE GLYCOL 3350 17 G PO PACK
17.0000 g | PACK | Freq: Every day | ORAL | Status: DC
  Administered 2023-07-07 – 2023-07-09 (×2): 17 g via ORAL
  Filled 2023-07-07 (×2): qty 1

## 2023-07-07 MED ORDER — BISACODYL 5 MG PO TBEC
15.0000 mg | DELAYED_RELEASE_TABLET | Freq: Every day | ORAL | Status: DC | PRN
  Administered 2023-07-07: 10 mg via ORAL
  Administered 2023-07-08: 15 mg via ORAL
  Filled 2023-07-07 (×2): qty 3

## 2023-07-07 MED ORDER — LOSARTAN POTASSIUM 50 MG PO TABS: 25.0000 mg | ORAL_TABLET | Freq: Every day | ORAL | Status: DC

## 2023-07-07 MED ORDER — BISACODYL 5 MG PO TBEC
5.0000 mg | DELAYED_RELEASE_TABLET | Freq: Every day | ORAL | Status: DC | PRN
  Administered 2023-07-07: 5 mg via ORAL
  Filled 2023-07-07: qty 1

## 2023-07-07 MED ORDER — SUCRALFATE 1 G PO TABS
1.0000 g | ORAL_TABLET | Freq: Three times a day (TID) | ORAL | Status: DC
  Administered 2023-07-07 – 2023-07-09 (×5): 1 g via ORAL
  Filled 2023-07-07 (×6): qty 1

## 2023-07-07 NOTE — ED Notes (Signed)
Pt provided with orange juice at this time.

## 2023-07-07 NOTE — ED Notes (Signed)
Pull labs at 0500
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
## 2023-07-07 NOTE — Progress Notes (Signed)
Progress Note   Patient: [name] NFA:[id number] DOB: 1932-01-17 DOA: 07/06/2023     1 DOS: the patient was seen and examined on 07/07/2023   Brief hospital course: RONTAE INGLETT is a 89 y.o. male with past medical history significant for coronary artery disease, CVA (on aspirin and Plavix, last dose of Plavix on 07/05/2023), congestive heart failure, diabetes mellitus, obstructive sleep apnea.  Presented to hospital on 07/06/2023 with chief complaint of melena (last occurring on 07/02/2023), shortness of breath and dizziness.    Admitted for further management and GI evaluation.  Assessment and Plan: Melena History of peptic ulcer disease H/o prior GI bleed 02/2023 He had an episode of black tarry stool on Monday afterward did not have any another episode and no further bowel movement. Hemoglobin 7.2 improved to 8.  Baseline hemoglobin around 8.5-9.5.  FOBT positive. CT abdomen pelvis showed no acute intracranial pathology.  Extensive multilevel vessel coronary artery disease. Continue Protonix 40 mg IV twice daily. Hold Plavix in the setting of GI bleed. Monitor H&H and transfuse as needed to give hemoglobin above 8 given patient has multiple comorbidities include history of CAD and CKD stage IV. GI evaluation appreciated. Continue clear liquid diet. Possible endoscopy tomorrow AM.  Acute on chronic anemia Anemia of chronic disease secondary to CKD stage IV Acute on chronic anemia in the setting of GI bleed. Monitor H/H closely.   Hypothyroidism Continue levothyroxine.   Third-degree AV block s/p pacemaker placement 1990 and last generator change in 2020. Patient has history of complete AV block. Home dose metoprolol resumed.   Chronic systolic and diastolic heart failure reduced EF 40 to 45% Essential hypertension Chest x-ray no acute cardiopulmonary process. Currently he is not on any blood pressure regimen at home however he has history of chronic dyspnea and uses portable  oxygen.  Physical exam showing bilateral lower extremities trace edema.  Chest x-ray no evidence of acute pulmonary vascular congestion.  Enlarged cardiac silhouette. Checking BNP. Continue Toprol-XL 25 mg daily As patient has combined systolic diastolic heart failure reduced EF 40 to 45% he supposed to be on GDMT guided medication however patient had history of blood pressure regimen intolerance due to hypotension and previously being on midodrine for a while. Continue to hold ACE inhibitor/ARB/SGLT2 inhibitor/MRA inhibitor in the setting of history of chronic hypotension.   Chronic hypoxic respiratory failure on 2L O2. Continue home O2 at baseline.   Insulin-dependent DM type II Continue sliding scale SSI and at bedtime insulin coverage. Can resume Novolin once solid food would be initiated.    History of CAD Hold Plavix in the setting of GI bleed. Continue Crestor and Toprol-XL 25 mg daily.   History of CVA Hold Plavix in the setting of GI bleed. Continue Crestor.   History of memory impairment Continue memantine 10 mg twice daily.    Out of bed to chair. Incentive spirometry. Nursing supportive care. Fall, aspiration precautions. DVT prophylaxis   Code Status: Full Code  Subjective: Patient is seen and examined today morning. He is lying in bed, denies any active rectal bleeding. Son at bedside. Hb today 8.  Physical Exam: Vitals:   07/07/23 0755 07/07/23 1035 07/07/23 1243 07/07/23 1307  BP:  (!) 122/50  (!) 132/97  Pulse: 84 70  65  Resp: (!) 21 19  18   Temp: 97.8 F (36.6 C)  98 F (36.7 C) 97.6 F (36.4 C)  TempSrc: Oral   Oral  SpO2: 100% 98%  99%  General - Elderly obese Caucasian male, no apparent distress HEENT - PERRLA, EOMI, atraumatic head, non tender sinuses. Lung - Clear, basal rales, rhonchi, wheezes. Heart - S1, S2 heard, no murmurs, rubs, trace pedal edema. Abdomen - Soft, non tender, obese, bowel sounds good Neuro - Alert, awake and oriented  x 3, non focal exam. Skin - Warm and dry.  Data Reviewed:      Latest Ref Rng & Units 07/07/2023    2:05 PM 07/07/2023    5:34 AM 07/06/2023    4:09 PM  CBC  WBC 4.0 - 10.5 K/uL  9.2  9.1   Hemoglobin 13.0 - 17.0 g/dL 8.7  8.0  7.2   Hematocrit 39.0 - 52.0 % 27.5  26.1  23.4   Platelets 150 - 400 K/uL  145  170       Latest Ref Rng & Units 07/07/2023    5:34 AM 07/06/2023    4:09 PM 07/03/2023    9:24 AM  BMP  Glucose 70 - 99 mg/dL 94  914  782   BUN 8 - 23 mg/dL 58  64  956   Creatinine 0.61 - 1.24 mg/dL 2.13  0.86  5.78   Sodium 135 - 145 mmol/L 140  140  136   Potassium 3.5 - 5.1 mmol/L 4.0  4.3  4.9   Chloride 98 - 111 mmol/L 104  104  104   CO2 22 - 32 mmol/L 25  27  23    Calcium 8.9 - 10.3 mg/dL 8.6  9.0  9.0    DG Chest Port 1 View Result Date: 07/06/2023 CLINICAL DATA:  Shortness of breath and weakness EXAM: PORTABLE CHEST 1 VIEW COMPARISON:  07/03/2023 FINDINGS: Cardiac shadow is enlarged but stable. Pacing device is again seen and stable. Overall inspiratory effort is poor. Fiducial markers are noted in the right lung. No focal confluent infiltrate is seen. IMPRESSION: No acute abnormality noted. Electronically Signed   By: Alcide Clever M.D.   On: 07/06/2023 21:29   CT ABDOMEN PELVIS WO CONTRAST Result Date: 07/06/2023 CLINICAL DATA:  Acute nonlocalized abdominal pain EXAM: CT ABDOMEN AND PELVIS WITHOUT CONTRAST TECHNIQUE: Multidetector CT imaging of the abdomen and pelvis was performed following the standard protocol without IV contrast. RADIATION DOSE REDUCTION: This exam was performed according to the departmental dose-optimization program which includes automated exposure control, adjustment of the mA and/or kV according to patient size and/or use of iterative reconstruction technique. COMPARISON:  None Available. FINDINGS: Lower chest: No acute abnormality. Extensive multi-vessel coronary artery calcification. Pacemaker leads are seen within the right heart.  Hypoattenuation of the cardiac blood pool is in keeping with at least mild anemia. Hepatobiliary: Cholelithiasis without pericholecystic inflammatory change. Liver unremarkable on this noncontrast examination. No intra or extrahepatic biliary ductal dilation. Pancreas: Punctate calcifications throughout the pancreatic parenchyma are in keeping with changes of pancreatitis. No superimposed acute peripancreatic inflammatory changes or loculated peripancreatic fluid collections. The pancreatic duct is not dilated. Spleen: Unremarkable Adrenals/Urinary Tract: The adrenal glands are unremarkable. The kidneys are unremarkable on this noncontrast examination. Bladder unremarkable. Stomach/Bowel: Mild sigmoid diverticulosis. Stomach, small bowel, and large bowel are otherwise unremarkable. Appendix normal. No free intraperitoneal gas or fluid. Vascular/Lymphatic: Aortic atherosclerosis. No enlarged abdominal or pelvic lymph nodes. Reproductive: Brief there sees are seen within the prostate Other: Small fat containing umbilical hernia Musculoskeletal: Advanced degenerative changes are seen throughout the visualized thoracolumbar spine. No acute bone abnormality. No lytic or blastic bone lesion IMPRESSION: 1. No acute intra-abdominal  pathology identified. No definite radiographic explanation for the patient's reported symptoms. 2. Extensive multi-vessel coronary artery calcification. 3. Cholelithiasis. 4. Mild sigmoid diverticulosis. 5. Changes of chronic pancreatitis. No superimposed acute peripancreatic inflammatory changes or loculated peripancreatic fluid collections. Aortic Atherosclerosis (ICD10-I70.0). Electronically Signed   By: Helyn Numbers M.D.   On: 07/06/2023 20:17   Family Communication: Discussed with patient, son at bedside, they understand and agree. All questions answereed.  Disposition: Status is: Inpatient Remains inpatient appropriate because: GI work up.  Planned Discharge Destination:  Home     Time spent: 39 minutes  Author: Marcelino Duster, MD 07/07/2023 6:22 PM Secure chat 7am to 7pm For on call review [email or web address].

## 2023-07-08 DIAGNOSIS — K921 Melena: Secondary | ICD-10-CM | POA: Diagnosis not present

## 2023-07-08 LAB — CBC
HCT: 26.9 % — ABNORMAL LOW (ref 39.0–52.0)
Hemoglobin: 8.4 g/dL — ABNORMAL LOW (ref 13.0–17.0)
MCH: 27.8 pg (ref 26.0–34.0)
MCHC: 31.2 g/dL (ref 30.0–36.0)
MCV: 89.1 fL (ref 80.0–100.0)
Platelets: 169 10*3/uL (ref 150–400)
RBC: 3.02 MIL/uL — ABNORMAL LOW (ref 4.22–5.81)
RDW: 17.7 % — ABNORMAL HIGH (ref 11.5–15.5)
WBC: 9.1 10*3/uL (ref 4.0–10.5)
nRBC: 0 % (ref 0.0–0.2)

## 2023-07-08 LAB — BASIC METABOLIC PANEL
Anion gap: 8 (ref 5–15)
BUN: 48 mg/dL — ABNORMAL HIGH (ref 8–23)
CO2: 27 mmol/L (ref 22–32)
Calcium: 8.6 mg/dL — ABNORMAL LOW (ref 8.9–10.3)
Chloride: 104 mmol/L (ref 98–111)
Creatinine, Ser: 2.65 mg/dL — ABNORMAL HIGH (ref 0.61–1.24)
GFR, Estimated: 22 mL/min — ABNORMAL LOW (ref >60)
Glucose, Bld: 132 mg/dL — ABNORMAL HIGH (ref 70–99)
Potassium: 4 mmol/L (ref 3.5–5.1)
Sodium: 139 mmol/L (ref 135–145)

## 2023-07-08 LAB — GLUCOSE, CAPILLARY
Glucose-Capillary: 139 mg/dL — ABNORMAL HIGH (ref 70–99)
Glucose-Capillary: 159 mg/dL — ABNORMAL HIGH (ref 70–99)
Glucose-Capillary: 262 mg/dL — ABNORMAL HIGH (ref 70–99)
Glucose-Capillary: 184 mg/dL — ABNORMAL HIGH (ref 70–99)

## 2023-07-08 LAB — CBC WITH DIFFERENTIAL/PLATELET
Abs Immature Granulocytes: 0.02 10*3/uL (ref 0.00–0.07)
Basophils Absolute: 0 10*3/uL (ref 0.0–0.1)
Basophils Relative: 0 %
Eosinophils Absolute: 0.2 10*3/uL (ref 0.0–0.5)
Eosinophils Relative: 2 %
HCT: 25.2 % — ABNORMAL LOW (ref 39.0–52.0)
Hemoglobin: 8 g/dL — ABNORMAL LOW (ref 13.0–17.0)
Immature Granulocytes: 0 %
Lymphocytes Relative: 20 %
Lymphs Abs: 1.7 10*3/uL (ref 0.7–4.0)
MCH: 28.3 pg (ref 26.0–34.0)
MCHC: 31.7 g/dL (ref 30.0–36.0)
MCV: 89 fL (ref 80.0–100.0)
Monocytes Absolute: 0.8 10*3/uL (ref 0.1–1.0)
Monocytes Relative: 9 %
Neutro Abs: 5.7 10*3/uL (ref 1.7–7.7)
Neutrophils Relative %: 69 %
Platelets: 153 10*3/uL (ref 150–400)
RBC: 2.83 MIL/uL — ABNORMAL LOW (ref 4.22–5.81)
RDW: 18 % — ABNORMAL HIGH (ref 11.5–15.5)
WBC: 8.3 10*3/uL (ref 4.0–10.5)
nRBC: 0 % (ref 0.0–0.2)

## 2023-07-08 LAB — PHOSPHORUS: Phosphorus: 4.5 mg/dL (ref 2.5–4.6)

## 2023-07-08 LAB — MAGNESIUM: Magnesium: 2.3 mg/dL (ref 1.7–2.4)

## 2023-07-08 MED ORDER — KCL IN DEXTROSE-NACL 10-5-0.45 MEQ/L-%-% IV SOLN
INTRAVENOUS | Status: DC
  Filled 2023-07-08: qty 1000

## 2023-07-08 MED ORDER — BISACODYL 10 MG RE SUPP
10.0000 mg | Freq: Once | RECTAL | Status: AC
  Administered 2023-07-08: 10 mg via RECTAL
  Filled 2023-07-08: qty 1

## 2023-07-08 NOTE — Plan of Care (Signed)
  Problem: Education: Goal: Ability to describe self-care measures that may prevent or decrease complications (Diabetes Survival Skills Education) will improve Outcome: Progressing Goal: Individualized Educational Video(s) Outcome: Progressing   Problem: Coping: Goal: Ability to adjust to condition or change in health will improve Outcome: Progressing   Problem: Health Behavior/Discharge Planning: Goal: Ability to identify and utilize available resources and services will improve Outcome: Progressing   Problem: Metabolic: Goal: Ability to maintain appropriate glucose levels will improve Outcome: Progressing   Problem: Nutritional: Goal: Maintenance of adequate nutrition will improve Outcome: Progressing

## 2023-07-08 NOTE — Progress Notes (Addendum)
[facility] Progress Note  SUBJECTIVE:   Interval history: [name] was seen and evaluated today at bedside. Daughter in law at bedside. Had melena since last visit. Now agreeable to EGD. No chest pain or shortness of breath. Some epigastric pain to palpation.  Past Medical History:  Diagnosis Date   Abnormal Doppler ultrasound of carotid artery 12/16/2012   mildly abnormal doppler . no prior studies to compare to. follow up studies are recommended when clinically indicated.   Acute respiratory failure with hypoxia 11/2018   Blepharitis    CAD (coronary artery disease) 07/24/2013   Previously placed drug-eluting stents in the LAD and RCA in 2005, low risk perfusion study December 2011 in January 2014   Cardiomyopathy, ischemic 07/24/2013   EF 45-50% echo March 2014   Carotid stenosis, left 11/2018   on left 60%   Cerebrovascular disease 03/24/2022   CHB (complete heart block) 08/27/2019   Chronic combined systolic and diastolic heart failure 02/19/2015   CKD (chronic kidney disease) stage 3, GFR 30-59 ml/min 07/24/2013   Diabetic hypoglycemia 05/02/2021   Diabetic polyneuropathy 05/02/2021   Gastric ulcer    GI bleed    History of cancer    History of echocardiogram 08/15/2012   EF  45-50 % very difficult study, severe LVH, pacemaker, trace TR,mildly enlarged RV,; aorta mildly calcified   History of stress test 06/06/2012   low risk scan. fixed inferior defect consistant with prior infarction with moderatly reduced EF. no appreciable evidence of ischemia. 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 DDDR ; this is the third generator this pt. has had   Pain due to onychomycosis of toenails of both feet  11/13/2018   Pain in joint of right shoulder 09/15/2021   Second degree AV block 03/09/2014   Sensorineural hearing loss (SNHL) of both ears 04/08/2018   Shingles    Sinus node dysfunction 03/09/2014   TIA (transient ischemic attack) 11/2018   vs stroke not seen on CT   Tinnitus, bilateral 04/08/2018   Type II diabetes mellitus    Past Surgical History:  Procedure Laterality Date   BIOPSY  03/02/2023   Procedure: BIOPSY;  Surgeon: Vida Rigger, MD;  Location: [facility];  Service: Gastroenterology;;   CARDIAC CATHETERIZATION  12-29-2003   rt. coronary angiography was done with a 6 french 4 cm taper coronary cath. This demonstrated  approximately 70 to 75% mildly segmental stenosis in the mid RCA before the acute margin           CARDIAC CATHETERIZATION  11-19-2003   diagnostic cath,, pt. to get second cath to have stent placed   cardiac stents     CATARACT EXTRACTION     ESOPHAGOGASTRODUODENOSCOPY (EGD) WITH PROPOFOL N/A 03/02/2023   Procedure: ESOPHAGOGASTRODUODENOSCOPY (EGD) WITH PROPOFOL;  Surgeon: Vida Rigger, MD;  Location: [facility];  Service: Gastroenterology;  Laterality: N/A;   KNEE SURGERY Right    PPM GENERATOR CHANGEOUT N/A 04/07/2019   Procedure: PPM GENERATOR CHANGEOUT;  Surgeon: Thurmon Fair, MD;  Location: [facility];  Service: Cardiovascular;  Laterality: N/A;   PROSTATE BIOPSY     prostate seed implants     SPINE SURGERY     WISDOM TOOTH  EXTRACTION     Current Facility-Administered Medications  Medication Dose Route Frequency Provider Last Rate Last Admin   acetaminophen (TYLENOL) tablet 650 mg  650 mg Oral Q6H PRN Janalyn Shy, Subrina, MD       Or   acetaminophen (TYLENOL) suppository 650 mg  650 mg Rectal Q6H PRN Janalyn Shy, Subrina, MD       bisacodyl (DULCOLAX) EC tablet 15 mg  15 mg Oral Daily PRN Liliane Shi H, DO   15 mg at 07/08/23 0803   [START ON 07/09/2023] calcitRIOL (ROCALTROL) capsule 0.25 mcg  0.25 mcg Oral Once per day on Monday Thursday  Sundil, Subrina, MD       dextrose 5 % and 0.45 % NaCl with KCl 10 mEq/L infusion   Intravenous Continuous Leroy Sea, MD 50 mL/hr at 07/08/23 0753 New Bag at 07/08/23 0753   insulin aspart (novoLOG) injection 0-5 Units  0-5 Units Subcutaneous QHS Tereasa Coop, MD   2 Units at 07/07/23 2120   insulin aspart (novoLOG) injection 0-6 Units  0-6 Units Subcutaneous TID WC Sundil, Subrina, MD   1 Units at 07/08/23 0804   levothyroxine (SYNTHROID) tablet 25 mcg  25 mcg Oral q morning Sundil, Subrina, MD   25 mcg at 07/08/23 0617   memantine (NAMENDA) tablet 10 mg  10 mg Oral BID Janalyn Shy, Subrina, MD   10 mg at 07/08/23 0804   metoprolol succinate (TOPROL-XL) 24 hr tablet 25 mg  25 mg Oral Daily Sundil, Subrina, MD   25 mg at 07/08/23 0804   ondansetron (ZOFRAN) tablet 4 mg  4 mg Oral Q6H PRN Janalyn Shy, Subrina, MD       Or   ondansetron [facility]) injection 4 mg  4 mg Intravenous Q6H PRN Janalyn Shy, Subrina, MD       pantoprazole (PROTONIX) injection 40 mg  40 mg Intravenous Q12H Sundil, Subrina, MD   40 mg at 07/08/23 0804   polyethylene glycol (MIRALAX / GLYCOLAX) packet 17 g  17 g Oral Daily Liliane Shi H, DO   17 g at 07/07/23 1454   rosuvastatin (CRESTOR) tablet 40 mg  40 mg Oral Daily Sundil, Subrina, MD   40 mg at 07/08/23 0803   sodium chloride flush (NS) 0.9 % injection 3 mL  3 mL Intravenous Q12H Sundil, Subrina, MD   3 mL at 07/08/23 0805   sodium chloride flush (NS) 0.9 % injection 3 mL  3 mL Intravenous PRN Sundil, Subrina, MD       sucralfate (CARAFATE) tablet 1 g  1 g Oral TID WC & HS Liliane Shi H, DO   1 g at 07/08/23 0804   Allergies as of 07/06/2023   (No Known Allergies)   Review of Systems:  Review of Systems  Respiratory:  Negative for shortness of breath.   Cardiovascular:  Negative for chest pain.  Gastrointestinal:  Positive for melena. Negative for abdominal pain.    OBJECTIVE:   Temp:  [97.6 F (36.4 C)-98.9 F (37.2 C)] 98.9 F (37.2 C) (02/16  0715) Pulse Rate:  [62-71] 62 (02/16 0715) Resp:  [16-18] 16 (02/16 0715) BP: (112-132)/(63-97) 112/63 (02/16 0715) SpO2:  [94 %-99 %] 99 % (02/16 0715) Last BM Date : 07/02/23 Physical Exam Constitutional:      General: He is not in acute distress.    Appearance: He is not ill-appearing, toxic-appearing or diaphoretic.  Cardiovascular:     Rate and Rhythm: Normal rate and regular rhythm.  Pulmonary:     Effort: No respiratory  distress.     Breath sounds: Normal breath sounds.     Comments: 3L supplemental oxygen nasal cannula Abdominal:     General: Bowel sounds are normal. There is no distension.     Palpations: Abdomen is soft.     Tenderness: There is no abdominal tenderness. There is no guarding.  Musculoskeletal:     Right lower leg: No edema.     Left lower leg: No edema.  Skin:    General: Skin is warm and dry.  Neurological:     Mental Status: He is alert.     Labs: Recent Labs    07/06/23 1609 07/07/23 0534 07/07/23 1405 07/07/23 1801 07/08/23 0601  WBC 9.1 9.2  --   --  8.3  HGB 7.2* 8.0* 8.7* 9.4* 8.0*  HCT 23.4* 26.1* 27.5* 29.1* 25.2*  PLT 170 145*  --   --  153   BMET Recent Labs    07/06/23 1609 07/07/23 0534 07/08/23 0601  NA 140 140 139  K 4.3 4.0 4.0  CL 104 104 104  CO2 27 25 27   GLUCOSE 105* 94 132*  BUN 64* 58* 48*  CREATININE 2.29* 2.26* 2.65*  CALCIUM 9.0 8.6* 8.6*   LFT Recent Labs    07/07/23 0534  PROT 5.6*  ALBUMIN 3.0*  AST 26  ALT 20  ALKPHOS 40  BILITOT 0.7   PT/INR No results for input(s): "LABPROT", "INR" in the last 72 hours. Diagnostic imaging: DG Chest Port 1 View Result Date: 07/06/2023 CLINICAL DATA:  Shortness of breath and weakness EXAM: PORTABLE CHEST 1 VIEW COMPARISON:  07/03/2023 FINDINGS: Cardiac shadow is enlarged but stable. Pacing device is again seen and stable. Overall inspiratory effort is poor. Fiducial markers are noted in the right lung. No focal confluent infiltrate is seen. IMPRESSION: No  acute abnormality noted. Electronically Signed   By: Alcide Clever M.D.   On: 07/06/2023 21:29   CT ABDOMEN PELVIS WO CONTRAST Result Date: 07/06/2023 CLINICAL DATA:  Acute nonlocalized abdominal pain EXAM: CT ABDOMEN AND PELVIS WITHOUT CONTRAST TECHNIQUE: Multidetector CT imaging of the abdomen and pelvis was performed following the standard protocol without IV contrast. RADIATION DOSE REDUCTION: This exam was performed according to the departmental dose-optimization program which includes automated exposure control, adjustment of the mA and/or kV according to patient size and/or use of iterative reconstruction technique. COMPARISON:  None Available. FINDINGS: Lower chest: No acute abnormality. Extensive multi-vessel coronary artery calcification. Pacemaker leads are seen within the right heart. Hypoattenuation of the cardiac blood pool is in keeping with at least mild anemia. Hepatobiliary: Cholelithiasis without pericholecystic inflammatory change. Liver unremarkable on this noncontrast examination. No intra or extrahepatic biliary ductal dilation. Pancreas: Punctate calcifications throughout the pancreatic parenchyma are in keeping with changes of pancreatitis. No superimposed acute peripancreatic inflammatory changes or loculated peripancreatic fluid collections. The pancreatic duct is not dilated. Spleen: Unremarkable Adrenals/Urinary Tract: The adrenal glands are unremarkable. The kidneys are unremarkable on this noncontrast examination. Bladder unremarkable. Stomach/Bowel: Mild sigmoid diverticulosis. Stomach, small bowel, and large bowel are otherwise unremarkable. Appendix normal. No free intraperitoneal gas or fluid. Vascular/Lymphatic: Aortic atherosclerosis. No enlarged abdominal or pelvic lymph nodes. Reproductive: Brief there sees are seen within the prostate Other: Small fat containing umbilical hernia Musculoskeletal: Advanced degenerative changes are seen throughout the visualized thoracolumbar  spine. No acute bone abnormality. No lytic or blastic bone lesion IMPRESSION: 1. No acute intra-abdominal pathology identified. No definite radiographic explanation for the patient's reported symptoms. 2. Extensive multi-vessel coronary artery  calcification. 3. Cholelithiasis. 4. Mild sigmoid diverticulosis. 5. Changes of chronic pancreatitis. No superimposed acute peripancreatic inflammatory changes or loculated peripancreatic fluid collections. Aortic Atherosclerosis (ICD10-I70.0). Electronically Signed   By: Helyn Numbers M.D.   On: 07/06/2023 20:17   IMPRESSION: Symptomatic anemia Melena History linear, superficial, non-bleeding gastric ulcer on 03/02/23 EGD Constipation Imaging findings suggestive of chronic pancreatitis Coronary artery disease History CVA (on dual antiplatelet therapy prior to admission, last clopidogrel on 07/05/23) Congestive heart failure Obstructive sleep apnea  PLAN: -Recommend EGD, discussed procedure with patient including benefits, alternatives and risks of bleeding, infection, perforation, anesthesia, he verbalized understanding and elected to proceed -IV PPI Q12Hr -Trend H/H, transfuse for Hgb < 7  -Maintain NPO -Hopeful EGD today, coordinating with endo staff -Further recommendations to follow pending procedure   LOS: 2 days   Liliane Shi, DO [facility]  UPDATE 12:20 PM -After speaking with endoscopy staff regarding anesthesia availability, able to complete EGD procedure tomorrow roughly 11:30 AM -Ok for diet today, NPO at midnight for EGD with Dr. Marca Ancona tomorrow   Liliane Shi, DO [facility]

## 2023-07-08 NOTE — Evaluation (Signed)
Physical Therapy Evaluation and Discharge Patient Details Name: [name] MRN: [id number] DOB: 1931-06-06 Today's Date: 07/08/2023  History of Present Illness  89 y.o. male Presented to hospital on 07/06/2023 with chief complaint of melena (last occurring on 07/02/2023), shortness of breath and dizziness. PMH includes CVA, CHF,ARF, CA, DM, pacemaker, TIA, CAD, CKD IV, HTN, OSA, DJD, mixed alzheimers and vascular dementia.  Clinical Impression   Patient evaluated by Physical Therapy with no further acute PT needs identified. Patient ambulated on 3L O2 with sats 92% and mild dyspnea (although talking while walking). Recommend continued mobility with nursing and Mobility Team. PT is signing off. Thank you for this referral.         If plan is discharge home, recommend the following: Assistance with cooking/housework   Can travel by private vehicle        Equipment Recommendations None recommended by PT  Recommendations for Other Services       Functional Status Assessment Patient has not had a recent decline in their functional status     Precautions / Restrictions Precautions Precautions: Other (comment) Precaution/Restrictions Comments: watch sats      Mobility  Bed Mobility Overal bed mobility: Needs Assistance Bed Mobility: Supine to Sit     Supine to sit: Min assist, HOB elevated     General bed mobility comments: to fully raise torso    Transfers Overall transfer level: Needs assistance Equipment used: Rolling walker (2 wheels) Transfers: Sit to/from Stand Sit to Stand: Min assist           General transfer comment: to steady RW as pt accustomed to rollator with brakes; no physical assist    Ambulation/Gait Ambulation/Gait assistance: Modified independent (Device/Increase time) Gait Distance (Feet): 40 Feet Assistive device: Rolling walker (2 wheels) Gait Pattern/deviations: Step-through pattern, Decreased stride length   Gait velocity  interpretation: 1.31 - 2.62 ft/sec, indicative of limited community ambulator   General Gait Details: on 3L O2 with sats 92%  Stairs            Wheelchair Mobility     Tilt Bed    Modified Rankin (Stroke Patients Only)       Balance Overall balance assessment: Modified Independent                                           Pertinent Vitals/Pain Pain Assessment Pain Assessment: No/denies pain    Home Living Family/patient expects to be discharged to:: Private residence Living Arrangements: Children Available Help at Discharge: Family;Available 24 hours/day Type of Home: House Home Access: Ramped entrance       Home Layout: Multi-level;Able to live on main level with bedroom/bathroom Home Equipment: Rollator (4 wheels);Cane - single point;Grab bars - toilet;Grab bars - tub/shower      Prior Function Prior Level of Function : Independent/Modified Independent             Mobility Comments: ambulates with rollator       Extremity/Trunk Assessment   Upper Extremity Assessment Upper Extremity Assessment: Generalized weakness;RUE deficits/detail RUE Deficits / Details: painful rt shoulder with limited ROM    Lower Extremity Assessment Lower Extremity Assessment: Generalized weakness    Cervical / Trunk Assessment Cervical / Trunk Assessment: Other exceptions Cervical / Trunk Exceptions: overweight  Communication   Communication Communication: No apparent difficulties    Cognition Arousal: Alert Behavior During Therapy: [facility] for  tasks assessed/performed   PT - Cognitive impairments: No apparent impairments                         Following commands: Intact       Cueing Cueing Techniques: Verbal cues     General Comments      Exercises     Assessment/Plan    PT Assessment Patient does not need any further PT services  PT Problem List         PT Treatment Interventions      PT Goals (Current goals can  be found in the Care Plan section)  Acute Rehab PT Goals Patient Stated Goal: get EGD done and go home PT Goal Formulation: All assessment and education complete, DC therapy    Frequency       Co-evaluation               AM-PAC PT "6 Clicks" Mobility  Outcome Measure Help needed turning from your back to your side while in a flat bed without using bedrails?: None Help needed moving from lying on your back to sitting on the side of a flat bed without using bedrails?: A Little Help needed moving to and from a bed to a chair (including a wheelchair)?: A Little Help needed standing up from a chair using your arms (e.g., wheelchair or bedside chair)?: A Little Help needed to walk in hospital room?: None Help needed climbing 3-5 steps with a railing? : A Little 6 Click Score: 20    End of Session Equipment Utilized During Treatment: Gait belt;Oxygen Activity Tolerance: Patient tolerated treatment well Patient left: in chair;with call bell/phone within reach;with chair alarm set Nurse Communication: Mobility status PT Visit Diagnosis: Other abnormalities of gait and mobility (R26.89)    Time: 5409-8119 PT Time Calculation (min) (ACUTE ONLY): 15 min   Charges:   PT Evaluation $PT Eval Low Complexity: 1 Low   PT General Charges $$ ACUTE PT VISIT: 1 Visit          Jerolyn Center, PT Acute Rehabilitation Services  Office [phone]   Zena Amos 07/08/2023, 1:24 PM

## 2023-07-08 NOTE — Progress Notes (Signed)
PROGRESS NOTE                                                                                                                                                                                                             Patient Demographics:    [name], is a 89 y.o. male, DOB - Jul 31, 1931, ZOX:[id number]  Outpatient Primary MD for the patient is Mila Palmer, MD    LOS - 2  Admit date - 07/06/2023    No chief complaint on file.      Brief Narrative (HPI from H&P)   89 y.o. male with past medical history significant for coronary artery disease, CVA (on aspirin and Plavix, last dose of Plavix on 07/05/2023), congestive heart failure, diabetes mellitus, obstructive sleep apnea. Presented to hospital on 07/06/2023 with chief complaint of melena (last occurring on 07/02/2023), shortness of breath and dizziness.    Subjective:    [name] today has, No headache, No chest pain, No abdominal pain - No Nausea, No new weakness tingling or numbness, no SOB   Assessment  & Plan :    Melena, History of peptic ulcer disease, H/o prior GI bleed 02/2023, on Plavix due to CAD. He had an episode of black tarry stool on Monday, with subsequent acute blood loss related anemia, has received 1 unit of packed RBC thus far this hospitalization, Plavix on hold, once cleared by GI will resume, continue on IV PPI GI on board likely to undergo EGD.  Need to monitor CBC closely no signs of fresh ongoing bleeding.     Acute blood loss related anemia on top of anemia of chronic disease due to CKD stage IV.  See above.      Hypothyroidism  Continue levothyroxine.   Third-degree AV block s/p pacemaker placement 1990 and last generator change in 2020.  Patient has history of complete AV block. Home dose metoprolol resumed.   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  Currently compensated, on home oxygen which will be  continued, continue Toprol-XL, continue to hold ACE, ARB, Entresto, SGLT2 due to underlying CKD and borderline hypotension.  Essential hypertension stable on beta-blocker monitor.  Chronic hypoxic respiratory failure on 2L O2.  Continue home O2 at baseline.   Insulin-dependent DM type II Continue sliding scale SSI and at bedtime insulin coverage.  Can resume Novolin once solid food would be initiated.    History of CAD - Hold Plavix in the setting of GI bleed.  Resume once cleared by GI to do so, continue beta-blocker and statin.   History of CVA Hold Plavix in the setting of GI bleed. Continue Crestor.   History of memory impairment Continue memantine 10 mg twice daily.        Condition -  Guarded  Family Communication  :  daughter in law bedside 07/08/23  Code Status :  Full  Consults  :   GI  PUD Prophylaxis : PPI   Procedures  :     CT -  1. No acute intra-abdominal pathology identified. No definite radiographic explanation for the patient's reported symptoms. 2. Extensive multi-vessel coronary artery calcification. 3. Cholelithiasis. 4. Mild sigmoid diverticulosis. 5. Changes of chronic pancreatitis. No superimposed acute peripancreatic inflammatory changes or loculated peripancreatic fluid collections. Aortic Atherosclerosis (ICD10-I70.0).      Disposition Plan  :    Status is: Inpatient   DVT Prophylaxis  :    SCDs Start: 07/06/23 2332 Place TED hose Start: 07/06/23 2332    Lab Results  Component Value Date   PLT 153 07/08/2023    Diet :  Diet Order             Diet NPO time specified Except for: Sips with Meds, Ice Chips  Diet effective midnight                    Inpatient Medications  Scheduled Meds:  [START ON 07/09/2023] calcitRIOL  0.25 mcg Oral Once per day on Monday Thursday   insulin aspart  0-5 Units Subcutaneous QHS   insulin aspart  0-6 Units Subcutaneous TID WC   levothyroxine  25 mcg Oral q morning   memantine  10 mg Oral BID    metoprolol succinate  25 mg Oral Daily   pantoprazole (PROTONIX) IV  40 mg Intravenous Q12H   polyethylene glycol  17 g Oral Daily   rosuvastatin  40 mg Oral Daily   sodium chloride flush  3 mL Intravenous Q12H   sucralfate  1 g Oral TID WC & HS   Continuous Infusions:  dextrose 5 % and 0.45 % NaCl with KCl 10 mEq/L 50 mL/hr at 07/08/23 0753   PRN Meds:.acetaminophen **OR** acetaminophen, bisacodyl, ondansetron **OR** ondansetron (ZOFRAN) IV, sodium chloride flush  Antibiotics  :    Anti-infectives (From admission, onward)    None         Objective:   Vitals:   07/07/23 1243 07/07/23 1307 07/07/23 1939 07/07/23 2331  BP:  (!) 132/97 113/72 121/63  Pulse:  65 69 71  Resp:  18 17 18   Temp: 98 F (36.7 C) 97.6 F (36.4 C) 97.8 F (36.6 C) 97.7 F (36.5 C)  TempSrc:  Oral Oral Oral  SpO2:  99% 96% 94%    Wt Readings from Last 3 Encounters:  07/03/23 105.2 kg  05/09/23 103.9 kg  03/14/23 108.4 kg     Intake/Output Summary (Last 24 hours) at 07/08/2023 0846 Last data filed at 07/07/2023 2120 Gross per 24 hour  Intake 243 ml  Output --  Net 243 ml     Physical Exam  Awake Alert, No new F.N deficits, Normal affect [place].AT,PERRAL Supple Neck, No JVD,   Symmetrical Chest wall movement, Good air movement bilaterally, CTAB RRR,No Gallops,Rubs or new Murmurs,  +ve B.Sounds, Abd Soft, No tenderness,   No  Cyanosis, Clubbing or edema     Data Review:    Recent Labs  Lab 07/03/23 0924 07/06/23 1609 07/07/23 0534 07/07/23 1405 07/07/23 1801 07/08/23 0601  WBC 13.2* 9.1 9.2  --   --  8.3  HGB 8.5* 7.2* 8.0* 8.7* 9.4* 8.0*  HCT 27.4* 23.4* 26.1* 27.5* 29.1* 25.2*  PLT 152 170 145*  --   --  153  MCV 90.4 91.4 90.6  --   --  89.0  MCH 28.1 28.1 27.8  --   --  28.3  MCHC 31.0 30.8 30.7  --   --  31.7  RDW 17.5* 19.4* 18.6*  --   --  18.0*  LYMPHSABS 1.6 1.8  --   --   --  1.7  MONOABS 0.8 0.9  --   --   --  0.8  EOSABS 0.0 0.1  --   --   --  0.2  BASOSABS  0.0 0.0  --   --   --  0.0    Recent Labs  Lab 07/03/23 0924 07/06/23 1609 07/07/23 0214 07/07/23 0534 07/08/23 0601  NA 136 140  --  140 139  K 4.9 4.3  --  4.0 4.0  CL 104 104  --  104 104  CO2 23 27  --  25 27  ANIONGAP 9 9  --  11 8  GLUCOSE 223* 105*  --  94 132*  BUN 118* 64*  --  58* 48*  CREATININE 2.49* 2.29*  --  2.26* 2.65*  AST 18 30  --  26  --   ALT 13 21  --  20  --   ALKPHOS 37* 40  --  40  --   BILITOT 0.5 0.5  --  0.7  --   ALBUMIN 3.0* 3.2*  --  3.0*  --   LATICACIDVEN  --   --  0.9  --   --   TSH 2.364  --   --   --   --   BNP  --  125.6*  --   --   --   MG  --   --   --   --  2.3  PHOS  --   --   --   --  4.5  CALCIUM 9.0 9.0  --  8.6* 8.6*      Recent Labs  Lab 07/03/23 0924 07/06/23 1609 07/07/23 0214 07/07/23 0534 07/08/23 0601  LATICACIDVEN  --   --  0.9  --   --   TSH 2.364  --   --   --   --   BNP  --  125.6*  --   --   --   MG  --   --   --   --  2.3  CALCIUM 9.0 9.0  --  8.6* 8.6*    --------------------------------------------------------------------------------------------------------------- Lab Results  Component Value Date   CHOL 147 03/05/2022   HDL 35 (L) 03/05/2022   LDLCALC 47 03/05/2022   LDLDIRECT 138 (H) 04/01/2015   TRIG 327 (H) 03/05/2022   CHOLHDL 4.2 03/05/2022    Lab Results  Component Value Date   HGBA1C 7.2 (H) 03/01/2023      Micro Results Recent Results (from the past 240 hours)  Resp panel by RT-PCR (RSV, Flu A&B, Covid) Anterior Nasal Swab     Status: None   Collection Time: 07/03/23  9:26 AM   Specimen: Anterior Nasal Swab  Result Value Ref Range Status  SARS Coronavirus 2 by RT PCR NEGATIVE NEGATIVE Final   Influenza A by PCR NEGATIVE NEGATIVE Final   Influenza B by PCR NEGATIVE NEGATIVE Final    Comment: (NOTE) The Xpert Xpress SARS-CoV-2/FLU/RSV plus assay is intended as an aid in the diagnosis of influenza from Nasopharyngeal swab specimens and should not be used as a sole basis for  treatment. Nasal washings and aspirates are unacceptable for Xpert Xpress SARS-CoV-2/FLU/RSV testing.  Fact Sheet for Patients: BloggerCourse.com  Fact Sheet for Healthcare Providers: SeriousBroker.it  This test is not yet approved or cleared by the [place] FDA and has been authorized for detection and/or diagnosis of SARS-CoV-2 by FDA under an Emergency Use Authorization (EUA). This EUA will remain in effect (meaning this test can be used) for the duration of the COVID-19 declaration under Section 564(b)(1) of the Act, 21 U.S.C. section 360bbb-3(b)(1), unless the authorization is terminated or revoked.     Resp Syncytial Virus by PCR NEGATIVE NEGATIVE Final    Comment: (NOTE) Fact Sheet for Patients: BloggerCourse.com  Fact Sheet for Healthcare Providers: SeriousBroker.it  This test is not yet approved or cleared by the [place] FDA and has been authorized for detection and/or diagnosis of SARS-CoV-2 by FDA under an Emergency Use Authorization (EUA). This EUA will remain in effect (meaning this test can be used) for the duration of the COVID-19 declaration under Section 564(b)(1) of the Act, 21 U.S.C. section 360bbb-3(b)(1), unless the authorization is terminated or revoked.  Performed at [facility], [address] [place], [place] [address]     Radiology Report DG Chest Port 1 View Result Date: 07/06/2023 CLINICAL DATA:  Shortness of breath and weakness EXAM: PORTABLE CHEST 1 VIEW COMPARISON:  07/03/2023 FINDINGS: Cardiac shadow is enlarged but stable. Pacing device is again seen and stable. Overall inspiratory effort is poor. Fiducial markers are noted in the right lung. No focal confluent infiltrate is seen. IMPRESSION: No acute abnormality noted. Electronically Signed   By: Alcide Clever M.D.   On: 07/06/2023 21:29   CT ABDOMEN PELVIS WO  CONTRAST Result Date: 07/06/2023 CLINICAL DATA:  Acute nonlocalized abdominal pain EXAM: CT ABDOMEN AND PELVIS WITHOUT CONTRAST TECHNIQUE: Multidetector CT imaging of the abdomen and pelvis was performed following the standard protocol without IV contrast. RADIATION DOSE REDUCTION: This exam was performed according to the departmental dose-optimization program which includes automated exposure control, adjustment of the mA and/or kV according to patient size and/or use of iterative reconstruction technique. COMPARISON:  None Available. FINDINGS: Lower chest: No acute abnormality. Extensive multi-vessel coronary artery calcification. Pacemaker leads are seen within the right heart. Hypoattenuation of the cardiac blood pool is in keeping with at least mild anemia. Hepatobiliary: Cholelithiasis without pericholecystic inflammatory change. Liver unremarkable on this noncontrast examination. No intra or extrahepatic biliary ductal dilation. Pancreas: Punctate calcifications throughout the pancreatic parenchyma are in keeping with changes of pancreatitis. No superimposed acute peripancreatic inflammatory changes or loculated peripancreatic fluid collections. The pancreatic duct is not dilated. Spleen: Unremarkable Adrenals/Urinary Tract: The adrenal glands are unremarkable. The kidneys are unremarkable on this noncontrast examination. Bladder unremarkable. Stomach/Bowel: Mild sigmoid diverticulosis. Stomach, small bowel, and large bowel are otherwise unremarkable. Appendix normal. No free intraperitoneal gas or fluid. Vascular/Lymphatic: Aortic atherosclerosis. No enlarged abdominal or pelvic lymph nodes. Reproductive: Brief there sees are seen within the prostate Other: Small fat containing umbilical hernia Musculoskeletal: Advanced degenerative changes are seen throughout the visualized thoracolumbar spine. No acute bone abnormality. No lytic or blastic bone lesion IMPRESSION:  1. No acute intra-abdominal pathology  identified. No definite radiographic explanation for the patient's reported symptoms. 2. Extensive multi-vessel coronary artery calcification. 3. Cholelithiasis. 4. Mild sigmoid diverticulosis. 5. Changes of chronic pancreatitis. No superimposed acute peripancreatic inflammatory changes or loculated peripancreatic fluid collections. Aortic Atherosclerosis (ICD10-I70.0). Electronically Signed   By: Helyn Numbers M.D.   On: 07/06/2023 20:17     Signature  -   Susa Raring M.D on 07/08/2023 at 8:46 AM   -  To page go to [email or web address]

## 2023-07-08 NOTE — H&P (View-Only) (Signed)
[facility] Progress Note  SUBJECTIVE:   Interval history: [name] was seen and evaluated today at bedside. Daughter in law at bedside. Had melena since last visit. Now agreeable to EGD. No chest pain or shortness of breath. Some epigastric pain to palpation.  Past Medical History:  Diagnosis Date   Abnormal Doppler ultrasound of carotid artery 12/16/2012   mildly abnormal doppler . no prior studies to compare to. follow up studies are recommended when clinically indicated.   Acute respiratory failure with hypoxia 11/2018   Blepharitis    CAD (coronary artery disease) 07/24/2013   Previously placed drug-eluting stents in the LAD and RCA in 2005, low risk perfusion study December 2011 in January 2014   Cardiomyopathy, ischemic 07/24/2013   EF 45-50% echo March 2014   Carotid stenosis, left 11/2018   on left 60%   Cerebrovascular disease 03/24/2022   CHB (complete heart block) 08/27/2019   Chronic combined systolic and diastolic heart failure 02/19/2015   CKD (chronic kidney disease) stage 3, GFR 30-59 ml/min 07/24/2013   Diabetic hypoglycemia 05/02/2021   Diabetic polyneuropathy 05/02/2021   Gastric ulcer    GI bleed    History of cancer    History of echocardiogram 08/15/2012   EF  45-50 % very difficult study, severe LVH, pacemaker, trace TR,mildly enlarged RV,; aorta mildly calcified   History of stress test 06/06/2012   low risk scan. fixed inferior defect consistant with prior infarction with moderatly reduced EF. no appreciable evidence of ischemia. 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 Jude Accent DDDR ; this is the third generator this pt. has had   Pain due to onychomycosis of toenails of both feet  11/13/2018   Pain in joint of right shoulder 09/15/2021   Second degree AV block 03/09/2014   Sensorineural hearing loss (SNHL) of both ears 04/08/2018   Shingles    Sinus node dysfunction 03/09/2014   TIA (transient ischemic attack) 11/2018   vs stroke not seen on CT   Tinnitus, bilateral 04/08/2018   Type II diabetes mellitus    Past Surgical History:  Procedure Laterality Date   BIOPSY  03/02/2023   Procedure: BIOPSY;  Surgeon: Vida Rigger, MD;  Location: [facility];  Service: Gastroenterology;;   CARDIAC CATHETERIZATION  12-29-2003   rt. coronary angiography was done with a 6 french 4 cm taper coronary cath. This demonstrated  approximately 70 to 75% mildly segmental stenosis in the mid RCA before the acute margin           CARDIAC CATHETERIZATION  11-19-2003   diagnostic cath,, pt. to get second cath to have stent placed   cardiac stents     CATARACT EXTRACTION     ESOPHAGOGASTRODUODENOSCOPY (EGD) WITH PROPOFOL N/A 03/02/2023   Procedure: ESOPHAGOGASTRODUODENOSCOPY (EGD) WITH PROPOFOL;  Surgeon: Vida Rigger, MD;  Location: [facility];  Service: Gastroenterology;  Laterality: N/A;   KNEE SURGERY Right    PPM GENERATOR CHANGEOUT N/A 04/07/2019   Procedure: PPM GENERATOR CHANGEOUT;  Surgeon: Thurmon Fair, MD;  Location: [facility];  Service: Cardiovascular;  Laterality: N/A;   PROSTATE BIOPSY     prostate seed implants     SPINE SURGERY     WISDOM TOOTH  EXTRACTION     Current Facility-Administered Medications  Medication Dose Route Frequency Provider Last Rate Last Admin   acetaminophen (TYLENOL) tablet 650 mg  650 mg Oral Q6H PRN Janalyn Shy, Subrina, MD       Or   acetaminophen (TYLENOL) suppository 650 mg  650 mg Rectal Q6H PRN Janalyn Shy, Subrina, MD       bisacodyl (DULCOLAX) EC tablet 15 mg  15 mg Oral Daily PRN Liliane Shi H, DO   15 mg at 07/08/23 0803   [START ON 07/09/2023] calcitRIOL (ROCALTROL) capsule 0.25 mcg  0.25 mcg Oral Once per day on Monday Thursday  Sundil, Subrina, MD       dextrose 5 % and 0.45 % NaCl with KCl 10 mEq/L infusion   Intravenous Continuous Leroy Sea, MD 50 mL/hr at 07/08/23 0753 New Bag at 07/08/23 0753   insulin aspart (novoLOG) injection 0-5 Units  0-5 Units Subcutaneous QHS Tereasa Coop, MD   2 Units at 07/07/23 2120   insulin aspart (novoLOG) injection 0-6 Units  0-6 Units Subcutaneous TID WC Sundil, Subrina, MD   1 Units at 07/08/23 0804   levothyroxine (SYNTHROID) tablet 25 mcg  25 mcg Oral q morning Sundil, Subrina, MD   25 mcg at 07/08/23 0617   memantine (NAMENDA) tablet 10 mg  10 mg Oral BID Janalyn Shy, Subrina, MD   10 mg at 07/08/23 0804   metoprolol succinate (TOPROL-XL) 24 hr tablet 25 mg  25 mg Oral Daily Sundil, Subrina, MD   25 mg at 07/08/23 0804   ondansetron (ZOFRAN) tablet 4 mg  4 mg Oral Q6H PRN Janalyn Shy, Subrina, MD       Or   ondansetron [facility]) injection 4 mg  4 mg Intravenous Q6H PRN Janalyn Shy, Subrina, MD       pantoprazole (PROTONIX) injection 40 mg  40 mg Intravenous Q12H Sundil, Subrina, MD   40 mg at 07/08/23 0804   polyethylene glycol (MIRALAX / GLYCOLAX) packet 17 g  17 g Oral Daily Liliane Shi H, DO   17 g at 07/07/23 1454   rosuvastatin (CRESTOR) tablet 40 mg  40 mg Oral Daily Sundil, Subrina, MD   40 mg at 07/08/23 0803   sodium chloride flush (NS) 0.9 % injection 3 mL  3 mL Intravenous Q12H Sundil, Subrina, MD   3 mL at 07/08/23 0805   sodium chloride flush (NS) 0.9 % injection 3 mL  3 mL Intravenous PRN Sundil, Subrina, MD       sucralfate (CARAFATE) tablet 1 g  1 g Oral TID WC & HS Liliane Shi H, DO   1 g at 07/08/23 0804   Allergies as of 07/06/2023   (No Known Allergies)   Review of Systems:  Review of Systems  Respiratory:  Negative for shortness of breath.   Cardiovascular:  Negative for chest pain.  Gastrointestinal:  Positive for melena. Negative for abdominal pain.    OBJECTIVE:   Temp:  [97.6 F (36.4 C)-98.9 F (37.2 C)] 98.9 F (37.2 C) (02/16  0715) Pulse Rate:  [62-71] 62 (02/16 0715) Resp:  [16-18] 16 (02/16 0715) BP: (112-132)/(63-97) 112/63 (02/16 0715) SpO2:  [94 %-99 %] 99 % (02/16 0715) Last BM Date : 07/02/23 Physical Exam Constitutional:      General: He is not in acute distress.    Appearance: He is not ill-appearing, toxic-appearing or diaphoretic.  Cardiovascular:     Rate and Rhythm: Normal rate and regular rhythm.  Pulmonary:     Effort: No respiratory  distress.     Breath sounds: Normal breath sounds.     Comments: 3L supplemental oxygen nasal cannula Abdominal:     General: Bowel sounds are normal. There is no distension.     Palpations: Abdomen is soft.     Tenderness: There is no abdominal tenderness. There is no guarding.  Musculoskeletal:     Right lower leg: No edema.     Left lower leg: No edema.  Skin:    General: Skin is warm and dry.  Neurological:     Mental Status: He is alert.     Labs: Recent Labs    07/06/23 1609 07/07/23 0534 07/07/23 1405 07/07/23 1801 07/08/23 0601  WBC 9.1 9.2  --   --  8.3  HGB 7.2* 8.0* 8.7* 9.4* 8.0*  HCT 23.4* 26.1* 27.5* 29.1* 25.2*  PLT 170 145*  --   --  153   BMET Recent Labs    07/06/23 1609 07/07/23 0534 07/08/23 0601  NA 140 140 139  K 4.3 4.0 4.0  CL 104 104 104  CO2 27 25 27   GLUCOSE 105* 94 132*  BUN 64* 58* 48*  CREATININE 2.29* 2.26* 2.65*  CALCIUM 9.0 8.6* 8.6*   LFT Recent Labs    07/07/23 0534  PROT 5.6*  ALBUMIN 3.0*  AST 26  ALT 20  ALKPHOS 40  BILITOT 0.7   PT/INR No results for input(s): "LABPROT", "INR" in the last 72 hours. Diagnostic imaging: DG Chest Port 1 View Result Date: 07/06/2023 CLINICAL DATA:  Shortness of breath and weakness EXAM: PORTABLE CHEST 1 VIEW COMPARISON:  07/03/2023 FINDINGS: Cardiac shadow is enlarged but stable. Pacing device is again seen and stable. Overall inspiratory effort is poor. Fiducial markers are noted in the right lung. No focal confluent infiltrate is seen. IMPRESSION: No  acute abnormality noted. Electronically Signed   By: Alcide Clever M.D.   On: 07/06/2023 21:29   CT ABDOMEN PELVIS WO CONTRAST Result Date: 07/06/2023 CLINICAL DATA:  Acute nonlocalized abdominal pain EXAM: CT ABDOMEN AND PELVIS WITHOUT CONTRAST TECHNIQUE: Multidetector CT imaging of the abdomen and pelvis was performed following the standard protocol without IV contrast. RADIATION DOSE REDUCTION: This exam was performed according to the departmental dose-optimization program which includes automated exposure control, adjustment of the mA and/or kV according to patient size and/or use of iterative reconstruction technique. COMPARISON:  None Available. FINDINGS: Lower chest: No acute abnormality. Extensive multi-vessel coronary artery calcification. Pacemaker leads are seen within the right heart. Hypoattenuation of the cardiac blood pool is in keeping with at least mild anemia. Hepatobiliary: Cholelithiasis without pericholecystic inflammatory change. Liver unremarkable on this noncontrast examination. No intra or extrahepatic biliary ductal dilation. Pancreas: Punctate calcifications throughout the pancreatic parenchyma are in keeping with changes of pancreatitis. No superimposed acute peripancreatic inflammatory changes or loculated peripancreatic fluid collections. The pancreatic duct is not dilated. Spleen: Unremarkable Adrenals/Urinary Tract: The adrenal glands are unremarkable. The kidneys are unremarkable on this noncontrast examination. Bladder unremarkable. Stomach/Bowel: Mild sigmoid diverticulosis. Stomach, small bowel, and large bowel are otherwise unremarkable. Appendix normal. No free intraperitoneal gas or fluid. Vascular/Lymphatic: Aortic atherosclerosis. No enlarged abdominal or pelvic lymph nodes. Reproductive: Brief there sees are seen within the prostate Other: Small fat containing umbilical hernia Musculoskeletal: Advanced degenerative changes are seen throughout the visualized thoracolumbar  spine. No acute bone abnormality. No lytic or blastic bone lesion IMPRESSION: 1. No acute intra-abdominal pathology identified. No definite radiographic explanation for the patient's reported symptoms. 2. Extensive multi-vessel coronary artery  calcification. 3. Cholelithiasis. 4. Mild sigmoid diverticulosis. 5. Changes of chronic pancreatitis. No superimposed acute peripancreatic inflammatory changes or loculated peripancreatic fluid collections. Aortic Atherosclerosis (ICD10-I70.0). Electronically Signed   By: Helyn Numbers M.D.   On: 07/06/2023 20:17   IMPRESSION: Symptomatic anemia Melena History linear, superficial, non-bleeding gastric ulcer on 03/02/23 EGD Constipation Imaging findings suggestive of chronic pancreatitis Coronary artery disease History CVA (on dual antiplatelet therapy prior to admission, last clopidogrel on 07/05/23) Congestive heart failure Obstructive sleep apnea  PLAN: -Recommend EGD, discussed procedure with patient including benefits, alternatives and risks of bleeding, infection, perforation, anesthesia, he verbalized understanding and elected to proceed -IV PPI Q12Hr -Trend H/H, transfuse for Hgb < 7  -Maintain NPO -Hopeful EGD today, coordinating with endo staff -Further recommendations to follow pending procedure   LOS: 2 days   Liliane Shi, DO [facility]  UPDATE 12:20 PM -After speaking with endoscopy staff regarding anesthesia availability, able to complete EGD procedure tomorrow roughly 11:30 AM -Ok for diet today, NPO at midnight for EGD with Dr. Marca Ancona tomorrow   Liliane Shi, DO [facility]

## 2023-07-09 ENCOUNTER — Inpatient Hospital Stay (HOSPITAL_COMMUNITY): Payer: Medicare Other | Admitting: Anesthesiology

## 2023-07-09 ENCOUNTER — Encounter (HOSPITAL_COMMUNITY): Payer: Self-pay | Admitting: Internal Medicine

## 2023-07-09 ENCOUNTER — Other Ambulatory Visit (HOSPITAL_COMMUNITY): Payer: Self-pay

## 2023-07-09 ENCOUNTER — Encounter (HOSPITAL_COMMUNITY)
Admission: EM | Disposition: A | Discharge status: Home / Self Care | Payer: Self-pay | Source: Home / Self Care | Attending: Internal Medicine

## 2023-07-09 DIAGNOSIS — K259 Gastric ulcer, unspecified as acute or chronic, without hemorrhage or perforation: Secondary | ICD-10-CM

## 2023-07-09 DIAGNOSIS — I2510 Atherosclerotic heart disease of native coronary artery without angina pectoris: Secondary | ICD-10-CM

## 2023-07-09 DIAGNOSIS — N184 Chronic kidney disease, stage 4 (severe): Secondary | ICD-10-CM

## 2023-07-09 DIAGNOSIS — K921 Melena: Secondary | ICD-10-CM | POA: Diagnosis not present

## 2023-07-09 DIAGNOSIS — I5042 Chronic combined systolic (congestive) and diastolic (congestive) heart failure: Secondary | ICD-10-CM

## 2023-07-09 HISTORY — PX: BIOPSY: SHX5522

## 2023-07-09 HISTORY — PX: ESOPHAGOGASTRODUODENOSCOPY: SHX5428

## 2023-07-09 LAB — CBC WITH DIFFERENTIAL/PLATELET
Abs Immature Granulocytes: 0.02 10*3/uL (ref 0.00–0.07)
Basophils Absolute: 0 10*3/uL (ref 0.0–0.1)
Basophils Relative: 0 %
Eosinophils Absolute: 0.1 10*3/uL (ref 0.0–0.5)
Eosinophils Relative: 1 %
HCT: 26.7 % — ABNORMAL LOW (ref 39.0–52.0)
Hemoglobin: 8.3 g/dL — ABNORMAL LOW (ref 13.0–17.0)
Immature Granulocytes: 0 %
Lymphocytes Relative: 16 %
Lymphs Abs: 1.3 10*3/uL (ref 0.7–4.0)
MCH: 27.8 pg (ref 26.0–34.0)
MCHC: 31.1 g/dL (ref 30.0–36.0)
MCV: 89.3 fL (ref 80.0–100.0)
Monocytes Absolute: 0.6 10*3/uL (ref 0.1–1.0)
Monocytes Relative: 7 %
Neutro Abs: 6.2 10*3/uL (ref 1.7–7.7)
Neutrophils Relative %: 76 %
Platelets: 173 10*3/uL (ref 150–400)
RBC: 2.99 MIL/uL — ABNORMAL LOW (ref 4.22–5.81)
RDW: 17.1 % — ABNORMAL HIGH (ref 11.5–15.5)
WBC: 8.2 10*3/uL (ref 4.0–10.5)
nRBC: 0 % (ref 0.0–0.2)

## 2023-07-09 LAB — BASIC METABOLIC PANEL
Anion gap: 5 (ref 5–15)
BUN: 43 mg/dL — ABNORMAL HIGH (ref 8–23)
CO2: 28 mmol/L (ref 22–32)
Calcium: 8.3 mg/dL — ABNORMAL LOW (ref 8.9–10.3)
Chloride: 106 mmol/L (ref 98–111)
Creatinine, Ser: 2.79 mg/dL — ABNORMAL HIGH (ref 0.61–1.24)
GFR, Estimated: 21 mL/min — ABNORMAL LOW (ref >60)
Glucose, Bld: 174 mg/dL — ABNORMAL HIGH (ref 70–99)
Potassium: 4.1 mmol/L (ref 3.5–5.1)
Sodium: 139 mmol/L (ref 135–145)

## 2023-07-09 LAB — PHOSPHORUS: Phosphorus: 4.2 mg/dL (ref 2.5–4.6)

## 2023-07-09 LAB — MAGNESIUM: Magnesium: 2.3 mg/dL (ref 1.7–2.4)

## 2023-07-09 LAB — BRAIN NATRIURETIC PEPTIDE: B Natriuretic Peptide: 114.7 pg/mL — ABNORMAL HIGH (ref 0.0–100.0)

## 2023-07-09 LAB — GLUCOSE, CAPILLARY
Glucose-Capillary: 147 mg/dL — ABNORMAL HIGH (ref 70–99)
Glucose-Capillary: 158 mg/dL — ABNORMAL HIGH (ref 70–99)

## 2023-07-09 SURGERY — EGD (ESOPHAGOGASTRODUODENOSCOPY)
Anesthesia: Monitor Anesthesia Care

## 2023-07-09 MED ORDER — PROPOFOL 500 MG/50ML IV EMUL
INTRAVENOUS | Status: DC | PRN
  Administered 2023-07-09: 100 ug/kg/min via INTRAVENOUS

## 2023-07-09 MED ORDER — NYSTATIN 100000 UNIT/GM EX POWD
Freq: Three times a day (TID) | CUTANEOUS | Status: DC
  Filled 2023-07-09: qty 15

## 2023-07-09 MED ORDER — CLOPIDOGREL BISULFATE 75 MG PO TABS: 75.0000 mg | ORAL_TABLET | Freq: Every day | ORAL | Status: DC

## 2023-07-09 MED ORDER — GERHARDT'S BUTT CREAM
TOPICAL_CREAM | Freq: Three times a day (TID) | CUTANEOUS | Status: DC
  Filled 2023-07-09: qty 60

## 2023-07-09 MED ORDER — PROPOFOL 10 MG/ML IV BOLUS
INTRAVENOUS | Status: DC | PRN
  Administered 2023-07-09: 40 mg via INTRAVENOUS

## 2023-07-09 MED ORDER — SODIUM CHLORIDE 0.9 % IV SOLN: INTRAVENOUS | Status: DC | PRN

## 2023-07-09 MED ORDER — LIDOCAINE 2% (20 MG/ML) 5 ML SYRINGE
INTRAMUSCULAR | Status: DC | PRN
  Administered 2023-07-09: 100 mg via INTRAVENOUS

## 2023-07-09 MED ORDER — PANTOPRAZOLE SODIUM 40 MG PO TBEC
40.0000 mg | DELAYED_RELEASE_TABLET | Freq: Two times a day (BID) | ORAL | 2 refills | Status: AC
  Filled 2023-07-09: qty 60, 30d supply, fill #0

## 2023-07-09 NOTE — Plan of Care (Signed)

## 2023-07-09 NOTE — Discharge Instructions (Addendum)
Follow with Primary MD Mila Palmer, MD in 7 days   Get CBC, CMP, Magnesium, Anemia panel -  checked next visit with your primary MD    Activity: As tolerated with Full fall precautions use walker/cane & assistance as needed  Disposition Home     Diet: Heart Healthy  Low Carb, check CBGs QAC-HS  Check your Weight same time everyday, if you gain over 2 pounds, or you develop in leg swelling, experience more shortness of breath or chest pain, call your Primary MD immediately. Follow Cardiac Low Salt Diet and 1.5 lit/day fluid restriction.  Special Instructions: If you have smoked or chewed Tobacco  in the last 2 yrs please stop smoking, stop any regular Alcohol  and or any Recreational drug use.  On your next visit with your primary care physician please Get Medicines reviewed and adjusted.  Please request your Prim.MD to go over all Hospital Tests and Procedure/Radiological results at the follow up, please get all Hospital records sent to your Prim MD by signing hospital release before you go home.  If you experience worsening of your admission symptoms, develop shortness of breath, life threatening emergency, suicidal or homicidal thoughts you must seek medical attention immediately by calling 911 or calling your MD immediately  if symptoms less severe.  You Must read complete instructions/literature along with all the possible adverse reactions/side effects for all the Medicines you take and that have been prescribed to you. Take any new Medicines after you have completely understood and accpet all the possible adverse reactions/side effects.   Do not drive when taking Pain medications.  Do not take more than prescribed Pain, Sleep and Anxiety Medications  Wear Seat belts while driving.

## 2023-07-09 NOTE — Interval H&P Note (Signed)
History and Physical Interval Note: 89 year old male with symptomatic anemia, melena, history of nonbleeding gastric ulcer on EGD from 03/02/2023, last dose of Plavix on 07/06/2023 for an EGD with propofol today.  07/09/2023 8:24 AM  [name]  has presented today for EGD with propofol, with the diagnosis of Melena, anemia.  The various methods of treatment have been discussed with the patient and family. After consideration of risks, benefits and other options for treatment, the patient has consented to  Procedure(s): ESOPHAGOGASTRODUODENOSCOPY (EGD) (N/A) as a surgical intervention.  The patient's history has been reviewed, patient examined, no change in status, stable for surgery.  I have reviewed the patient's chart and labs.  Questions were answered to the patient's satisfaction.     Kerin Salen

## 2023-07-09 NOTE — Anesthesia Preprocedure Evaluation (Signed)
Anesthesia Evaluation  Patient identified by MRN, date of birth, ID band Patient awake    Reviewed: Allergy & Precautions, H&P , NPO status , Patient's Chart, lab work & pertinent test results  Airway Mallampati: II  TM Distance: >3 FB Neck ROM: Full    Dental no notable dental hx.    Pulmonary sleep apnea    Pulmonary exam normal breath sounds clear to auscultation       Cardiovascular + CAD and +CHF  Normal cardiovascular exam+ dysrhythmias + pacemaker  Rhythm:Regular Rate:Normal  Complete heart block s/p St. Jude pacemaker.  Hx of reduced EF. Most recent echo from 2022 EF 40%   Neuro/Psych  PSYCHIATRIC DISORDERS     Dementia TIA   GI/Hepatic Neg liver ROS, PUD,,,Melena    Endo/Other  diabetesHypothyroidism    Renal/GU Renal disease  negative genitourinary   Musculoskeletal negative musculoskeletal ROS (+)    Abdominal  (+) + obese  Peds negative pediatric ROS (+)  Hematology  (+) Blood dyscrasia, anemia   Anesthesia Other Findings   Reproductive/Obstetrics negative OB ROS                             Anesthesia Physical Anesthesia Plan  ASA: 3  Anesthesia Plan: MAC   Post-op Pain Management:    Induction: Intravenous  PONV Risk Score and Plan: Propofol infusion and Treatment may vary due to age or medical condition  Airway Management Planned: Natural Airway  Additional Equipment:   Intra-op Plan:   Post-operative Plan:   Informed Consent: I have reviewed the patients History and Physical, chart, labs and discussed the procedure including the risks, benefits and alternatives for the proposed anesthesia with the patient or authorized representative who has indicated his/her understanding and acceptance.     Dental advisory given  Plan Discussed with: CRNA  Anesthesia Plan Comments:         Anesthesia Quick Evaluation

## 2023-07-09 NOTE — Progress Notes (Signed)
PROGRESS NOTE                                                                                                                                                                                                             Patient Demographics:    [name], is a 89 y.o. male, DOB - 03/19/1932, ZOX:[id number]  Outpatient Primary MD for the patient is Mila Palmer, MD    LOS - 3  Admit date - 07/06/2023    No chief complaint on file.      Brief Narrative (HPI from H&P)   89 y.o. male with past medical history significant for coronary artery disease, CVA (on aspirin and Plavix, last dose of Plavix on 07/05/2023), congestive heart failure, diabetes mellitus, obstructive sleep apnea. Presented to hospital on 07/06/2023 with chief complaint of melena (last occurring on 07/02/2023), shortness of breath and dizziness.    Subjective:   Sitting in bed no headache chest pain or abdominal pain, no further blood in stool or melena, no shortness of breath or focal weakness.   Assessment  & Plan :    Melena, History of peptic ulcer disease, H/o prior GI bleed 02/2023, on Plavix due to CAD. He had an episode of black tarry stool on Monday, with subsequent acute blood loss related anemia, has received 1 unit of packed RBC thus far this hospitalization, Plavix on hold, once cleared by GI will resume, continue on IV PPI GI on board likely to undergo EGD on 07/09/2023.  Need to monitor CBC closely no signs of fresh ongoing bleeding.     Acute blood loss related anemia on top of anemia of chronic disease due to CKD stage IV.  See above.      Hypothyroidism  Continue levothyroxine.   Third-degree AV block s/p pacemaker placement 1990 and last generator change in 2020.  Patient has history of complete AV block. Home dose metoprolol resumed.   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  Currently compensated, on home oxygen  which will be continued, continue Toprol-XL, continue to hold ACE, ARB, Entresto, SGLT2 due to underlying CKD and borderline hypotension.  Essential hypertension stable on beta-blocker monitor.  Chronic hypoxic respiratory failure on 2L O2.  Continue home O2 at baseline.   Insulin-dependent DM type II Continue sliding scale SSI and at bedtime  insulin coverage. Can resume Novolin once solid food would be initiated.    History of CAD - Hold Plavix in the setting of GI bleed.  Resume Plavix once cleared by GI to do so, continue beta-blocker and statin.   History of CVA Hold Plavix in the setting of GI bleed. Continue Crestor.   History of memory impairment Continue memantine 10 mg twice daily.        Condition -  Guarded  Family Communication  :  daughter in law bedside 07/08/23, 07/09/2023  Code Status :  Full  Consults  :   GI  PUD Prophylaxis : PPI   Procedures  :     CT -  1. No acute intra-abdominal pathology identified. No definite radiographic explanation for the patient's reported symptoms. 2. Extensive multi-vessel coronary artery calcification. 3. Cholelithiasis. 4. Mild sigmoid diverticulosis. 5. Changes of chronic pancreatitis. No superimposed acute peripancreatic inflammatory changes or loculated peripancreatic fluid collections. Aortic Atherosclerosis (ICD10-I70.0).      Disposition Plan  :    Status is: Inpatient   DVT Prophylaxis  :    SCDs Start: 07/06/23 2332 Place TED hose Start: 07/06/23 2332    Lab Results  Component Value Date   PLT 169 07/08/2023    Diet :  Diet Order             Diet NPO time specified Except for: Sips with Meds, Ice Chips  Diet effective midnight                    Inpatient Medications  Scheduled Meds:  [MAR Hold] calcitRIOL  0.25 mcg Oral Once per day on Monday Thursday   [facility] Hold] Gerhardt's butt cream   Topical TID   [MAR Hold] insulin aspart  0-5 Units Subcutaneous QHS   [MAR Hold] insulin aspart  0-6  Units Subcutaneous TID WC   [MAR Hold] levothyroxine  25 mcg Oral q morning   [MAR Hold] memantine  10 mg Oral BID   [MAR Hold] metoprolol succinate  25 mg Oral Daily   [MAR Hold] nystatin   Topical TID   [MAR Hold] pantoprazole (PROTONIX) IV  40 mg Intravenous Q12H   [MAR Hold] polyethylene glycol  17 g Oral Daily   [MAR Hold] rosuvastatin  40 mg Oral Daily   [MAR Hold] sodium chloride flush  3 mL Intravenous Q12H   [MAR Hold] sucralfate  1 g Oral TID WC & HS   Continuous Infusions:   PRN Meds:.[MAR Hold] acetaminophen **OR** [MAR Hold] acetaminophen, [MAR Hold] bisacodyl, [MAR Hold] ondansetron **OR** [MAR Hold] ondansetron (ZOFRAN) IV, [MAR Hold] sodium chloride flush  Antibiotics  :    Anti-infectives (From admission, onward)    None         Objective:   Vitals:   07/08/23 1630 07/08/23 1944 07/09/23 0228 07/09/23 0749  BP:  (!) 115/44 (!) 111/48 (!) 151/74  Pulse:  80 77 70  Resp: 18 20 18 14   Temp:  98.7 F (37.1 C) 98.3 F (36.8 C)   TempSrc: Oral Oral Oral   SpO2:  96% 98% 95%    Wt Readings from Last 3 Encounters:  07/03/23 105.2 kg  05/09/23 103.9 kg  03/14/23 108.4 kg     Intake/Output Summary (Last 24 hours) at 07/09/2023 0830 Last data filed at 07/08/2023 2200 Gross per 24 hour  Intake 478.29 ml  Output --  Net 478.29 ml     Physical Exam  Awake Alert, No new F.N  deficits, Normal affect [place].AT,PERRAL Supple Neck, No JVD,   Symmetrical Chest wall movement, Good air movement bilaterally, CTAB RRR,No Gallops,Rubs or new Murmurs,  +ve B.Sounds, Abd Soft, No tenderness,   No Cyanosis, Clubbing or edema     Data Review:    Recent Labs  Lab 07/03/23 0924 07/06/23 1609 07/07/23 0534 07/07/23 1405 07/07/23 1801 07/08/23 0601 07/08/23 1448  WBC 13.2* 9.1 9.2  --   --  8.3 9.1  HGB 8.5* 7.2* 8.0* 8.7* 9.4* 8.0* 8.4*  HCT 27.4* 23.4* 26.1* 27.5* 29.1* 25.2* 26.9*  PLT 152 170 145*  --   --  153 169  MCV 90.4 91.4 90.6  --   --  89.0 89.1   MCH 28.1 28.1 27.8  --   --  28.3 27.8  MCHC 31.0 30.8 30.7  --   --  31.7 31.2  RDW 17.5* 19.4* 18.6*  --   --  18.0* 17.7*  LYMPHSABS 1.6 1.8  --   --   --  1.7  --   MONOABS 0.8 0.9  --   --   --  0.8  --   EOSABS 0.0 0.1  --   --   --  0.2  --   BASOSABS 0.0 0.0  --   --   --  0.0  --     Recent Labs  Lab 07/03/23 0924 07/06/23 1609 07/07/23 0214 07/07/23 0534 07/08/23 0601  NA 136 140  --  140 139  K 4.9 4.3  --  4.0 4.0  CL 104 104  --  104 104  CO2 23 27  --  25 27  ANIONGAP 9 9  --  11 8  GLUCOSE 223* 105*  --  94 132*  BUN 118* 64*  --  58* 48*  CREATININE 2.49* 2.29*  --  2.26* 2.65*  AST 18 30  --  26  --   ALT 13 21  --  20  --   ALKPHOS 37* 40  --  40  --   BILITOT 0.5 0.5  --  0.7  --   ALBUMIN 3.0* 3.2*  --  3.0*  --   LATICACIDVEN  --   --  0.9  --   --   TSH 2.364  --   --   --   --   BNP  --  125.6*  --   --   --   MG  --   --   --   --  2.3  PHOS  --   --   --   --  4.5  CALCIUM 9.0 9.0  --  8.6* 8.6*      Recent Labs  Lab 07/03/23 0924 07/06/23 1609 07/07/23 0214 07/07/23 0534 07/08/23 0601  LATICACIDVEN  --   --  0.9  --   --   TSH 2.364  --   --   --   --   BNP  --  125.6*  --   --   --   MG  --   --   --   --  2.3  CALCIUM 9.0 9.0  --  8.6* 8.6*    --------------------------------------------------------------------------------------------------------------- Lab Results  Component Value Date   CHOL 147 03/05/2022   HDL 35 (L) 03/05/2022   LDLCALC 47 03/05/2022   LDLDIRECT 138 (H) 04/01/2015   TRIG 327 (H) 03/05/2022   CHOLHDL 4.2 03/05/2022    Lab Results  Component Value Date  HGBA1C 7.2 (H) 03/01/2023      Micro Results Recent Results (from the past 240 hours)  Resp panel by RT-PCR (RSV, Flu A&B, Covid) Anterior Nasal Swab     Status: None   Collection Time: 07/03/23  9:26 AM   Specimen: Anterior Nasal Swab  Result Value Ref Range Status   SARS Coronavirus 2 by RT PCR NEGATIVE NEGATIVE Final   Influenza A by PCR  NEGATIVE NEGATIVE Final   Influenza B by PCR NEGATIVE NEGATIVE Final    Comment: (NOTE) The Xpert Xpress SARS-CoV-2/FLU/RSV plus assay is intended as an aid in the diagnosis of influenza from Nasopharyngeal swab specimens and should not be used as a sole basis for treatment. Nasal washings and aspirates are unacceptable for Xpert Xpress SARS-CoV-2/FLU/RSV testing.  Fact Sheet for Patients: BloggerCourse.com  Fact Sheet for Healthcare Providers: SeriousBroker.it  This test is not yet approved or cleared by the [place] FDA and has been authorized for detection and/or diagnosis of SARS-CoV-2 by FDA under an Emergency Use Authorization (EUA). This EUA will remain in effect (meaning this test can be used) for the duration of the COVID-19 declaration under Section 564(b)(1) of the Act, 21 U.S.C. section 360bbb-3(b)(1), unless the authorization is terminated or revoked.     Resp Syncytial Virus by PCR NEGATIVE NEGATIVE Final    Comment: (NOTE) Fact Sheet for Patients: BloggerCourse.com  Fact Sheet for Healthcare Providers: SeriousBroker.it  This test is not yet approved or cleared by the [place] FDA and has been authorized for detection and/or diagnosis of SARS-CoV-2 by FDA under an Emergency Use Authorization (EUA). This EUA will remain in effect (meaning this test can be used) for the duration of the COVID-19 declaration under Section 564(b)(1) of the Act, 21 U.S.C. section 360bbb-3(b)(1), unless the authorization is terminated or revoked.  Performed at [facility], [address], [place], [place] [address]     Radiology Report No results found.    Signature  -   Susa Raring M.D on 07/09/2023 at 8:30 AM   -  To page go to [email or web address]

## 2023-07-09 NOTE — Anesthesia Postprocedure Evaluation (Signed)
Anesthesia Post Note  Patient: [name]  Procedure(s) Performed: ESOPHAGOGASTRODUODENOSCOPY (EGD) BIOPSY     Patient location during evaluation: PACU Anesthesia Type: MAC Level of consciousness: awake and alert Pain management: pain level controlled Vital Signs Assessment: post-procedure vital signs reviewed and stable Respiratory status: spontaneous breathing, nonlabored ventilation and respiratory function stable Cardiovascular status: blood pressure returned to baseline and stable Postop Assessment: no apparent nausea or vomiting Anesthetic complications: no   There were no known notable events for this encounter.  Last Vitals:  Vitals:   07/09/23 0901 07/09/23 0910  BP:  123/61  Pulse: 69 67  Resp: 12 12  Temp:    SpO2: 97% 97%    Last Pain:  Vitals:   07/09/23 0910  TempSrc:   PainSc: 0-No pain                 Lowella Curb

## 2023-07-09 NOTE — Progress Notes (Signed)
AVS provided, educational material discussed and reviewed with patient and family at bedside, verbalized understanding.  PIV d/c'd without complications.  Discharged home via wheelchair.

## 2023-07-09 NOTE — Op Note (Signed)
[facility] Patient Name: [name] Procedure Date : 07/09/2023 MRN: [id number] Attending MD: Kerin Salen , MD, [id number] Date of Birth: 1932/03/03 CSN: [id number] Age: 89 Admit Type: Outpatient Procedure:                Upper GI endoscopy Indications:              Iron deficiency anemia, Melena Providers:                Kerin Salen, MD, Fransisca Connors, Melany Guernsey,                            Technician Referring MD:             [facility] Medicines:                Monitored Anesthesia Care Complications:            No immediate complications. Estimated blood loss:                            Minimal. Estimated Blood Loss:     Estimated blood loss was minimal. Procedure:                Pre-Anesthesia Assessment:                           - Prior to the procedure, a History and Physical                            was performed, and patient medications and                            allergies were reviewed. The patient's tolerance of                            previous anesthesia was also reviewed. The risks                            and benefits of the procedure and the sedation                            options and risks were discussed with the patient.                            All questions were answered, and informed consent                            was obtained. Prior Anticoagulants: The patient has                            taken Plavix (clopidogrel), last dose was 3 days                            prior to procedure. ASA Grade Assessment: III - A  patient with severe systemic disease. After                            reviewing the risks and benefits, the patient was                            deemed in satisfactory condition to undergo the                            procedure.                           After obtaining informed consent, the endoscope was                            passed under direct vision. Throughout  the                            procedure, the patient's blood pressure, pulse, and                            oxygen saturations were monitored continuously. The                            [id number]-[id number] ([id number]) Olympus endoscope was introduced                            through the mouth, and advanced to the second part                            of duodenum. The upper GI endoscopy was                            accomplished without difficulty. The patient                            tolerated the procedure well. Scope In: Scope Out: Findings:      The examined esophagus was normal.      The Z-line was regular and was found 35 cm from the incisors.      One non-bleeding cratered gastric ulcer with a clean ulcer base (Forrest       Class III) was found in the gastric antrum. The lesion was 9 mm in       largest dimension. Biopsies were taken with a cold forceps for       Helicobacter pylori testing.      The cardia and gastric fundus were normal on retroflexion.      The examined duodenum was normal. Impression:               - Normal esophagus.                           - Z-line regular, 35 cm from the incisors.                           - Non-bleeding  gastric ulcer with a clean ulcer                            base (Forrest Class III). Biopsied.                           - Normal examined duodenum. Moderate Sedation:      Patient did not receive moderate sedation for this procedure, but       instead received monitored anesthesia care. Recommendation:           - Resume regular diet.                           - Continue present medications.                           - Await pathology results.                           - PPI BID for 2 months and then once a day                            indefinitely as he will need to stay on Plavix.                           - OK to resume Plavix from tomorrow. Procedure Code(s):        --- Professional ---                           [phone],  Esophagogastroduodenoscopy, flexible,                            transoral; with biopsy, single or multiple Diagnosis Code(s):        --- Professional ---                           K25.9, Gastric ulcer, unspecified as acute or                            chronic, without hemorrhage or perforation                           D50.9, Iron deficiency anemia, unspecified                           K92.1, Melena (includes Hematochezia) CPT copyright 2022 American [employer]. All rights reserved. The codes documented in this report are preliminary and upon coder review may  be revised to meet current compliance requirements. Kerin Salen, MD 07/09/2023 8:49:26 AM This report has been signed electronically. Number of Addenda: 0

## 2023-07-09 NOTE — Progress Notes (Signed)
Discharge nurse printed and reviewed AVS with patient and family  patient escorted with O2 via wheel chair to private Auto for discharge.

## 2023-07-09 NOTE — Discharge Summary (Signed)
[name] ZOX:[id number] DOB: August 03, 1931 DOA: 07/06/2023  PCP: Mila Palmer, MD  Admit date: 07/06/2023  Discharge date: 07/09/2023  Admitted From: Home   Disposition:  Home   Recommendations for Outpatient Follow-up:   Follow up with PCP in 1-2 weeks  PCP Please obtain BMP/CBC, 2 view CXR in 1week,  (see Discharge instructions)   PCP Please follow up on the following pending results:    Home Health: None   Equipment/Devices: None  Consultations: GI Discharge Condition: Stable    CODE STATUS: Full    Diet Recommendation: Heart Healthy Low Carb    CC - dark stools   Brief history of present illness from the day of admission and additional interim summary    89 y.o. male with past medical history significant for coronary artery disease, CVA (on aspirin and Plavix, last dose of Plavix on 07/05/2023), congestive heart failure, diabetes mellitus, obstructive sleep apnea. Presented to hospital on 07/06/2023 with chief complaint of melena (last occurring on 07/02/2023), shortness of breath and dizziness.                                                                  Hospital Course   Melena, History of peptic ulcer disease, H/o prior GI bleed 02/2023, on Plavix due to CAD. He had an episode of black tarry stool on Monday, with subsequent acute blood loss related anemia, has received 1 unit of packed RBC thus far this hospitalization, Plavix on hold, discussed with GI resume tomorrow, his CBC remains stable underwent EGD today showing findings as below, PPI twice daily for 2 months thereafter once daily indefinitely, follow-up with PCP and GI postdischarge.  Currently symptom-free eager to go home.     GI - EGD showed cratered antral ulcer with a clean base, I have taken biopsies. I will start him on regular  diet. Okay to resume Plavix from tomorrow. Recommend PPI twice daily for 2 months and then once a day indefinitely as he will be on Plavix.    Acute blood loss related anemia on top of anemia of chronic disease due to CKD stage IV.  See above.      Hypothyroidism  Continue levothyroxine.   Third-degree AV block s/p pacemaker placement 1990 and last generator change in 2020.  Patient has history of complete AV block. Home dose metoprolol resumed.   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  Currently compensated, on home oxygen which will be continued, continue Toprol-XL, continue to hold ACE, ARB, Entresto, SGLT2 due to underlying CKD and borderline hypotension.   Essential hypertension stable on beta-blocker monitor.   Chronic hypoxic respiratory failure on 2L O2.  Continue home O2 at baseline.   Insulin-dependent DM type II Continue regimen unchanged check CBG q. AC-HS  History of CAD -Plavix was held here per GI okay to resume from tomorrow continue beta-blocker and statin.   History of CVA Hold Plavix in the setting of GI bleed. Continue Crestor.   History of memory impairment Continue memantine 10 mg twice daily.   CKS stage IIIb baseline creatinine close to 2.65.  Currently close to baseline.  PCP to monitor.   Discharge diagnosis     Principal Problem:   Melena Active Problems:   History of gastric ulcer   Acute on chronic anemia   Mixed hyperlipidemia   Hypothyroidism   Insulin dependent type 2 diabetes mellitus (HCC)   CKD (chronic kidney disease), stage IV (HCC)   History of CAD (coronary artery disease)   History of CVA (cerebrovascular accident)    Discharge instructions    Discharge Instructions     Discharge instructions   Complete by: As directed    Follow with Primary MD Mila Palmer, MD in 7 days   Get CBC, CMP, Magnesium, Anemia panel -  checked next visit with your primary MD    Activity: As tolerated with Full fall  precautions use walker/cane & assistance as needed  Disposition Home     Diet: Heart Healthy  Low Carb, check CBGs QAC-HS  Check your Weight same time everyday, if you gain over 2 pounds, or you develop in leg swelling, experience more shortness of breath or chest pain, call your Primary MD immediately. Follow Cardiac Low Salt Diet and 1.5 lit/day fluid restriction.  Special Instructions: If you have smoked or chewed Tobacco  in the last 2 yrs please stop smoking, stop any regular Alcohol  and or any Recreational drug use.  On your next visit with your primary care physician please Get Medicines reviewed and adjusted.  Please request your Prim.MD to go over all Hospital Tests and Procedure/Radiological results at the follow up, please get all Hospital records sent to your Prim MD by signing hospital release before you go home.  If you experience worsening of your admission symptoms, develop shortness of breath, life threatening emergency, suicidal or homicidal thoughts you must seek medical attention immediately by calling 911 or calling your MD immediately  if symptoms less severe.  You Must read complete instructions/literature along with all the possible adverse reactions/side effects for all the Medicines you take and that have been prescribed to you. Take any new Medicines after you have completely understood and accpet all the possible adverse reactions/side effects.   Do not drive when taking Pain medications.  Do not take more than prescribed Pain, Sleep and Anxiety Medications  Wear Seat belts while driving.   Increase activity slowly   Complete by: As directed        Discharge Medications   Allergies as of 07/09/2023   No Known Allergies      Medication List     TAKE these medications    acetaminophen 500 MG tablet Commonly known as: TYLENOL Take 500 mg by mouth 2 (two) times daily as needed for moderate pain.   albuterol 108 (90 Base) MCG/ACT inhaler Commonly  known as: VENTOLIN HFA Inhale 2 puffs into the lungs every 4 (four) hours as needed for wheezing or shortness of breath.   calcitRIOL 0.25 MCG capsule Commonly known as: ROCALTROL Take 0.25 mcg by mouth 2 (two) times a week.   clopidogrel 75 MG tablet Commonly known as: PLAVIX Take 1 tablet (75 mg total) by mouth daily. Restart 10/15 Start taking on: July 10, 2023  Colace 100 MG capsule Generic drug: docusate sodium Take 100 mg by mouth daily.   COQ10 150 MG Caps Take 150 mg by mouth every evening.   Dulcolax 5 MG EC tablet Generic drug: bisacodyl Take 5 mg by mouth daily as needed for severe constipation.   FreeStyle Libre 3 Sensor Misc Apply topically.   Iron (Ferrous Sulfate) 325 (65 Fe) MG Tabs Take 325 mg by mouth daily.   levothyroxine 25 MCG tablet Commonly known as: SYNTHROID Take 25 mcg by mouth every morning.   memantine 10 MG tablet Commonly known as: NAMENDA Take 1 tablet (10 mg total) by mouth 2 (two) times daily.   metoprolol succinate 25 MG 24 hr tablet Commonly known as: Toprol XL Take 1 tablet (25 mg total) by mouth daily.   nitroGLYCERIN 0.4 MG SL tablet Commonly known as: NITROSTAT DISSOLVE ONE TABLET UNDER THE TONGUE EVERY 5 MINUTES AS NEEDED FOR CHEST PAIN.   NovoLIN 70/30 Kwikpen (70-30) 100 UNIT/ML KwikPen Generic drug: insulin isophane & regular human KwikPen Inject into the skin 2 (two) times daily. Takes 50 units in the morning and 13 units at night.   OVER THE COUNTER MEDICATION Take 2 tablets by mouth in the morning. Emma OTC medication   pantoprazole 40 MG tablet Commonly known as: PROTONIX Take 1 tablet (40 mg total) by mouth 2 (two) times daily.   polyethylene glycol 17 g packet Commonly known as: MIRALAX / GLYCOLAX Take 17 g by mouth daily.   RENAL MULTIVITAMIN/ZINC PO Take 1 tablet by mouth daily.   rosuvastatin 40 MG tablet Commonly known as: CRESTOR Take 1 tablet by mouth once daily         Follow-up  Information     Mila Palmer, MD. Schedule an appointment as soon as possible for a visit in 1 week(s).   Specialty: Family Medicine Contact information: [address] [place] [address] [phone]         Kerin Salen, MD. Schedule an appointment as soon as possible for a visit in 2 week(s).   Specialty: Gastroenterology Contact information: [address] [place] [address] [phone]                 Major procedures and Radiology Reports - PLEASE review detailed and final reports thoroughly  -       DG Chest [facility] 1 View Result Date: 07/06/2023 CLINICAL DATA:  Shortness of breath and weakness EXAM: PORTABLE CHEST 1 VIEW COMPARISON:  07/03/2023 FINDINGS: Cardiac shadow is enlarged but stable. Pacing device is again seen and stable. Overall inspiratory effort is poor. Fiducial markers are noted in the right lung. No focal confluent infiltrate is seen. IMPRESSION: No acute abnormality noted. Electronically Signed   By: Alcide Clever M.D.   On: 07/06/2023 21:29   CT ABDOMEN PELVIS WO CONTRAST Result Date: 07/06/2023 CLINICAL DATA:  Acute nonlocalized abdominal pain EXAM: CT ABDOMEN AND PELVIS WITHOUT CONTRAST TECHNIQUE: Multidetector CT imaging of the abdomen and pelvis was performed following the standard protocol without IV contrast. RADIATION DOSE REDUCTION: This exam was performed according to the departmental dose-optimization program which includes automated exposure control, adjustment of the mA and/or kV according to patient size and/or use of iterative reconstruction technique. COMPARISON:  None Available. FINDINGS: Lower chest: No acute abnormality. Extensive multi-vessel coronary artery calcification. Pacemaker leads are seen within the right heart. Hypoattenuation of the cardiac blood pool is in keeping with at least mild anemia. Hepatobiliary: Cholelithiasis without pericholecystic  inflammatory change. Liver unremarkable on  this noncontrast examination. No intra or extrahepatic biliary ductal dilation. Pancreas: Punctate calcifications throughout the pancreatic parenchyma are in keeping with changes of pancreatitis. No superimposed acute peripancreatic inflammatory changes or loculated peripancreatic fluid collections. The pancreatic duct is not dilated. Spleen: Unremarkable Adrenals/Urinary Tract: The adrenal glands are unremarkable. The kidneys are unremarkable on this noncontrast examination. Bladder unremarkable. Stomach/Bowel: Mild sigmoid diverticulosis. Stomach, small bowel, and large bowel are otherwise unremarkable. Appendix normal. No free intraperitoneal gas or fluid. Vascular/Lymphatic: Aortic atherosclerosis. No enlarged abdominal or pelvic lymph nodes. Reproductive: Brief there sees are seen within the prostate Other: Small fat containing umbilical hernia Musculoskeletal: Advanced degenerative changes are seen throughout the visualized thoracolumbar spine. No acute bone abnormality. No lytic or blastic bone lesion IMPRESSION: 1. No acute intra-abdominal pathology identified. No definite radiographic explanation for the patient's reported symptoms. 2. Extensive multi-vessel coronary artery calcification. 3. Cholelithiasis. 4. Mild sigmoid diverticulosis. 5. Changes of chronic pancreatitis. No superimposed acute peripancreatic inflammatory changes or loculated peripancreatic fluid collections. Aortic Atherosclerosis (ICD10-I70.0). Electronically Signed   By: Helyn Numbers M.D.   On: 07/06/2023 20:17   DG Abdomen Acute W/Chest Result Date: 07/03/2023 CLINICAL DATA:  Abdominal pain. EXAM: DG ABDOMEN ACUTE WITH 1 VIEW CHEST COMPARISON:  Chest radiograph dated 05/25/2023. Abdominal radiographs dated 02/27/2023. FINDINGS: Scattered gas and stool in the colon. No small or large bowel distention. No free intra-abdominal air. No radiopaque stones. Prostatic seed implants project over the pelvis. Visualized bones appear intact.  Stable cardiomegaly. Aortic atherosclerosis. Unchanged right chest wall pacing device in place. Mild right basilar atelectasis/scarring. No appreciable focal consolidation, sizeable pleural effusion, or pneumothorax. IMPRESSION: 1. Negative abdominal radiographs. 2. Mild right basilar atelectasis/scarring. Otherwise, no acute cardiopulmonary findings. Electronically Signed   By: Hart Robinsons M.D.   On: 07/03/2023 12:04   CUP PACEART REMOTE DEVICE CHECK Result Date: 06/26/2023 Scheduled remote reviewed. Normal device function.  1 AHR detection lasting 6 seconds. Next remote 91 days. - CS, CVRS   Micro Results    Recent Results (from the past 240 hours)  Resp panel by RT-PCR (RSV, Flu A&B, Covid) Anterior Nasal Swab     Status: None   Collection Time: 07/03/23  9:26 AM   Specimen: Anterior Nasal Swab  Result Value Ref Range Status   SARS Coronavirus 2 by RT PCR NEGATIVE NEGATIVE Final   Influenza A by PCR NEGATIVE NEGATIVE Final   Influenza B by PCR NEGATIVE NEGATIVE Final    Comment: (NOTE) The Xpert Xpress SARS-CoV-2/FLU/RSV plus assay is intended as an aid in the diagnosis of influenza from Nasopharyngeal swab specimens and should not be used as a sole basis for treatment. Nasal washings and aspirates are unacceptable for Xpert Xpress SARS-CoV-2/FLU/RSV testing.  Fact Sheet for Patients: BloggerCourse.com  Fact Sheet for Healthcare Providers: SeriousBroker.it  This test is not yet approved or cleared by the [place] FDA and has been authorized for detection and/or diagnosis of SARS-CoV-2 by FDA under an Emergency Use Authorization (EUA). This EUA will remain in effect (meaning this test can be used) for the duration of the COVID-19 declaration under Section 564(b)(1) of the Act, 21 U.S.C. section 360bbb-3(b)(1), unless the authorization is terminated or revoked.     Resp Syncytial Virus by PCR NEGATIVE NEGATIVE Final     Comment: (NOTE) Fact Sheet for Patients: BloggerCourse.com  Fact Sheet for Healthcare Providers: SeriousBroker.it  This test is not yet approved or cleared by the [place] and has been authorized  for detection and/or diagnosis of SARS-CoV-2 by FDA under an Emergency Use Authorization (EUA). This EUA will remain in effect (meaning this test can be used) for the duration of the COVID-19 declaration under Section 564(b)(1) of the Act, 21 U.S.C. section 360bbb-3(b)(1), unless the authorization is terminated or revoked.  Performed at [facility], [address], [place], [place] [address]     Today   Subjective    [name] today has no headache,no chest abdominal pain,no new weakness tingling or numbness, feels much better wants to go home today.    Objective   Blood pressure 123/61, pulse 67, temperature (!) 97.4 F (36.3 C), temperature source Temporal, resp. rate 12, SpO2 97%.   Intake/Output Summary (Last 24 hours) at 07/09/2023 1048 Last data filed at 07/09/2023 0842 Gross per 24 hour  Intake 628.29 ml  Output --  Net 628.29 ml    Exam  Awake Alert, No new F.N deficits,    [place].AT,PERRAL Supple Neck,   Symmetrical Chest wall movement, Good air movement bilaterally, CTAB RRR,No Gallops,   +ve B.Sounds, Abd Soft, Non tender,  No Cyanosis, Clubbing or edema    Data Review   Recent Labs  Lab 07/03/23 0924 07/06/23 1609 07/07/23 0534 07/07/23 1405 07/07/23 1801 07/08/23 0601 07/08/23 1448  WBC 13.2* 9.1 9.2  --   --  8.3 9.1  HGB 8.5* 7.2* 8.0* 8.7* 9.4* 8.0* 8.4*  HCT 27.4* 23.4* 26.1* 27.5* 29.1* 25.2* 26.9*  PLT 152 170 145*  --   --  153 169  MCV 90.4 91.4 90.6  --   --  89.0 89.1  MCH 28.1 28.1 27.8  --   --  28.3 27.8  MCHC 31.0 30.8 30.7  --   --  31.7 31.2  RDW 17.5* 19.4* 18.6*  --   --  18.0* 17.7*  LYMPHSABS 1.6 1.8  --   --   --  1.7  --   MONOABS 0.8 0.9  --   --   --  0.8   --   EOSABS 0.0 0.1  --   --   --  0.2  --   BASOSABS 0.0 0.0  --   --   --  0.0  --     Recent Labs  Lab 07/03/23 0924 07/06/23 1609 07/07/23 0214 07/07/23 0534 07/08/23 0601  NA 136 140  --  140 139  K 4.9 4.3  --  4.0 4.0  CL 104 104  --  104 104  CO2 23 27  --  25 27  ANIONGAP 9 9  --  11 8  GLUCOSE 223* 105*  --  94 132*  BUN 118* 64*  --  58* 48*  CREATININE 2.49* 2.29*  --  2.26* 2.65*  AST 18 30  --  26  --   ALT 13 21  --  20  --   ALKPHOS 37* 40  --  40  --   BILITOT 0.5 0.5  --  0.7  --   ALBUMIN 3.0* 3.2*  --  3.0*  --   LATICACIDVEN  --   --  0.9  --   --   TSH 2.364  --   --   --   --   BNP  --  125.6*  --   --   --   MG  --   --   --   --  2.3  PHOS  --   --   --   --  4.5  CALCIUM 9.0 9.0  --  8.6* 8.6*    Total Time in preparing paper work, data evaluation and todays exam - 35 minutes  Signature  -    Susa Raring M.D on 07/09/2023 at 10:48 AM   -  To page go to [email or web address]

## 2023-07-09 NOTE — Transfer of Care (Signed)
Immediate Anesthesia Transfer of Care Note  Patient: [name]  Procedure(s) Performed: ESOPHAGOGASTRODUODENOSCOPY (EGD) BIOPSY  Patient Location: Endoscopy Unit  Anesthesia Type:MAC  Level of Consciousness: awake, alert , and oriented  Airway & Oxygen Therapy: Patient Spontanous Breathing and Patient connected to nasal cannula oxygen  Post-op Assessment: Report given to RN and Post -op Vital signs reviewed and stable  Post vital signs: Reviewed and stable  Last Vitals:  Vitals Value Taken Time  BP 64/32 07/09/23 0850  Temp    Pulse 64 07/09/23 0850  Resp 15 07/09/23 0850  SpO2 95 % 07/09/23 0850  Vitals shown include unfiled device data.  Last Pain:  Vitals:   07/09/23 0749  TempSrc:   PainSc: 0-No pain         Complications: No notable events documented.

## 2023-07-09 NOTE — TOC Transition Note (Signed)
Transition of Care [facility]) - Discharge Note   Patient Details  Name: HERON PITCOCK MRN: [id number] Date of Birth: [birth year]  Transition of [facility]) CM/SW Contact:  Gordy Clement, RN Phone Number: 07/09/2023, 11:22 AM   Clinical Narrative:     Patient to DC to home today  No TOC needs Family to transport  Portable O2 is in car and patient will use one in room to get to car. Escort will return  to unit.            Patient Goals and CMS Choice            Discharge Placement                       Discharge Plan and Services Additional resources added to the After Visit Summary for                                       Social Drivers of Health (SDOH) Interventions SDOH Screenings   Food Insecurity: No Food Insecurity (07/07/2023)  Housing: Patient Declined (07/07/2023)  Transportation Needs: No Transportation Needs (07/07/2023)  Utilities: Not At Risk (07/07/2023)  Depression (PHQ2-9): Low Risk  (06/05/2022)  Social Connections: Patient Declined (07/07/2023)  Tobacco Use: Medium Risk (07/09/2023)     Readmission Risk Interventions     No data to display

## 2023-07-10 LAB — BPAM RBC
Blood Product Expiration Date: 202503112359
Blood Product Expiration Date: 202503112359
ISSUE DATE / TIME: 202502142349
Unit Type and Rh: 5100
Unit Type and Rh: 5100

## 2023-07-10 LAB — TYPE AND SCREEN
ABO/RH(D): O POS
Antibody Screen: NEGATIVE
Unit division: 0
Unit division: 0

## 2023-07-10 LAB — SURGICAL PATHOLOGY

## 2023-07-13 DIAGNOSIS — Z09 Encounter for follow-up examination after completed treatment for conditions other than malignant neoplasm: Secondary | ICD-10-CM | POA: Diagnosis not present

## 2023-07-13 DIAGNOSIS — E1142 Type 2 diabetes mellitus with diabetic polyneuropathy: Secondary | ICD-10-CM | POA: Diagnosis not present

## 2023-07-13 DIAGNOSIS — N184 Chronic kidney disease, stage 4 (severe): Secondary | ICD-10-CM | POA: Diagnosis not present

## 2023-07-13 DIAGNOSIS — E039 Hypothyroidism, unspecified: Secondary | ICD-10-CM | POA: Diagnosis not present

## 2023-07-13 DIAGNOSIS — K922 Gastrointestinal hemorrhage, unspecified: Secondary | ICD-10-CM | POA: Diagnosis not present

## 2023-08-02 NOTE — Progress Notes (Signed)
 Remote pacemaker transmission.

## 2023-08-06 DIAGNOSIS — D500 Iron deficiency anemia secondary to blood loss (chronic): Secondary | ICD-10-CM | POA: Diagnosis not present

## 2023-08-06 DIAGNOSIS — Z8719 Personal history of other diseases of the digestive system: Secondary | ICD-10-CM | POA: Diagnosis not present

## 2023-08-06 DIAGNOSIS — K254 Chronic or unspecified gastric ulcer with hemorrhage: Secondary | ICD-10-CM | POA: Diagnosis not present

## 2023-08-13 DIAGNOSIS — I2510 Atherosclerotic heart disease of native coronary artery without angina pectoris: Secondary | ICD-10-CM | POA: Diagnosis not present

## 2023-08-13 DIAGNOSIS — E1140 Type 2 diabetes mellitus with diabetic neuropathy, unspecified: Secondary | ICD-10-CM | POA: Diagnosis not present

## 2023-08-13 DIAGNOSIS — I5042 Chronic combined systolic (congestive) and diastolic (congestive) heart failure: Secondary | ICD-10-CM | POA: Diagnosis not present

## 2023-08-13 DIAGNOSIS — N184 Chronic kidney disease, stage 4 (severe): Secondary | ICD-10-CM | POA: Diagnosis not present

## 2023-08-20 ENCOUNTER — Ambulatory Visit (INDEPENDENT_AMBULATORY_CARE_PROVIDER_SITE_OTHER): Payer: Medicare Other | Admitting: Podiatry

## 2023-08-20 ENCOUNTER — Encounter: Payer: Self-pay | Admitting: Podiatry

## 2023-08-20 DIAGNOSIS — N184 Chronic kidney disease, stage 4 (severe): Secondary | ICD-10-CM | POA: Diagnosis not present

## 2023-08-20 DIAGNOSIS — M79674 Pain in right toe(s): Secondary | ICD-10-CM | POA: Diagnosis not present

## 2023-08-20 DIAGNOSIS — Z794 Long term (current) use of insulin: Secondary | ICD-10-CM

## 2023-08-20 DIAGNOSIS — E039 Hypothyroidism, unspecified: Secondary | ICD-10-CM | POA: Diagnosis not present

## 2023-08-20 DIAGNOSIS — I5042 Chronic combined systolic (congestive) and diastolic (congestive) heart failure: Secondary | ICD-10-CM | POA: Diagnosis not present

## 2023-08-20 DIAGNOSIS — B351 Tinea unguium: Secondary | ICD-10-CM

## 2023-08-20 DIAGNOSIS — M79675 Pain in left toe(s): Secondary | ICD-10-CM | POA: Diagnosis not present

## 2023-08-20 DIAGNOSIS — I2510 Atherosclerotic heart disease of native coronary artery without angina pectoris: Secondary | ICD-10-CM | POA: Diagnosis not present

## 2023-08-20 DIAGNOSIS — E1140 Type 2 diabetes mellitus with diabetic neuropathy, unspecified: Secondary | ICD-10-CM

## 2023-08-20 NOTE — Progress Notes (Signed)
This patient returns to my office for at risk foot care.  This patient requires this care by a professional since this patient will be at risk due to having diabetes and chronic kidney disease and coagulation defect.  Patient is taking plavix. This patient is unable to cut nails himself since the patient cannot reach his nails.These nails are painful walking and wearing shoes.  This patient presents for at risk foot care today.  General Appearance  Alert, conversant and in no acute stress.  Vascular  Dorsalis pedis and posterior tibial  pulses are palpable  bilaterally.  Capillary return is within normal limits  bilaterally. Temperature is within normal limits  bilaterally.  Neurologic  Senn-Weinstein monofilament wire test within normal limits  bilaterally. Muscle power within normal limits bilaterally.  Nails Thick disfigured discolored nails with subungual debris  from hallux to fifth toes bilaterally. No evidence of bacterial infection or drainage bilaterally.  Orthopedic  No limitations of motion  feet .  No crepitus or effusions noted.  No bony pathology or digital deformities noted.  Skin  normotropic skin with no porokeratosis noted bilaterally.  No signs of infections or ulcers noted.     Onychomycosis  Pain in right toes  Pain in left toes  Consent was obtained for treatment procedures.   Mechanical debridement of nails 1-5  bilaterally performed with a nail nipper.  Filed with dremel without incident.    Return office visit   3 months                  Told patient to return for periodic foot care and evaluation due to potential at risk complications.   [name] DPM

## 2023-08-27 ENCOUNTER — Encounter: Payer: Self-pay | Admitting: Physician Assistant

## 2023-08-27 ENCOUNTER — Ambulatory Visit (INDEPENDENT_AMBULATORY_CARE_PROVIDER_SITE_OTHER): Payer: BLUE CROSS/BLUE SHIELD | Admitting: Physician Assistant

## 2023-08-27 VITALS — BP 116/58 | HR 60 | Resp 20 | Wt 228.0 lb

## 2023-08-27 DIAGNOSIS — F01A0 Vascular dementia, mild, without behavioral disturbance, psychotic disturbance, mood disturbance, and anxiety: Secondary | ICD-10-CM

## 2023-08-27 NOTE — Patient Instructions (Addendum)
 It was a pleasure to see you today at our office.   Recommendations:  Follow up 03/03/2024 at 9:30  Continue Memantine 10 mg daily.     Whom to call:  Memory  decline, memory medications: Call our office [phone]   For psychiatric meds, mood meds: Please have your primary care physician manage these medications.    For assessment of decision of mental capacity and competency:  Call Dr. Erick Blinks, geriatric psychiatrist at [phone]  For guidance in geriatric dementia issues please call [facility] [phone]     If you have any severe symptoms of a stroke, or other severe issues such as confusion,severe chills or fever, etc call 911 or go to the ER as you may need to be evaluated further       RECOMMENDATIONS FOR ALL PATIENTS WITH MEMORY PROBLEMS: 1. Continue to exercise (Recommend 30 minutes of walking everyday, or 3 hours every week) 2. Increase social interactions - continue going to [place] and enjoy social gatherings with friends and family 3. Eat healthy, avoid fried foods and eat more fruits and vegetables 4. Maintain adequate blood pressure, blood sugar, and blood cholesterol level. Reducing the risk of stroke and cardiovascular disease also helps promoting better memory. 5. Avoid stressful situations. Live a simple life and avoid aggravations. Organize your time and prepare for the next day in anticipation. 6. Sleep well, avoid any interruptions of sleep and avoid any distractions in the bedroom that may interfere with adequate sleep quality 7. Avoid sugar, avoid sweets as there is a strong link between excessive sugar intake, diabetes, and cognitive impairment We discussed the Mediterranean diet, which has been shown to help patients reduce the risk of progressive memory disorders and reduces cardiovascular risk. This includes eating fish, eat fruits and green leafy vegetables, nuts like almonds and hazelnuts, walnuts, and also use olive oil. Avoid  fast foods and fried foods as much as possible. Avoid sweets and sugar as sugar use has been linked to worsening of memory function.  There is always a concern of gradual progression of memory problems. If this is the case, then we may need to adjust level of care according to patient needs. Support, both to the patient and caregiver, should then be put into place.    The [employer] is here all day, every day for people facing Alzheimer's disease through our free 24/7 Helpline: [phone]. The Helpline provides reliable information and support to all those who need assistance, such as individuals living with memory loss, Alzheimer's or other dementia, caregivers, health care professionals and the public.  Our highly trained and knowledgeable staff can help you with: Understanding memory loss, dementia and Alzheimer's  Medications and other treatment options  General information about aging and brain health  Skills to provide quality care and to find the best care from professionals  Legal, financial and living-arrangement decisions Our Helpline also features: Confidential care consultation provided by master's level clinicians who can help with decision-making support, crisis assistance and education on issues families face every day  Help in a caller's preferred language using our translation service that features more than 200 languages and dialects  Referrals to local community programs, services and ongoing support     FALL PRECAUTIONS: Be cautious when walking. Scan the area for obstacles that may increase the risk of trips and falls. When getting up in the mornings, sit up at the edge of the bed for a few minutes before getting out of bed. Consider  elevating the bed at the head end to avoid drop of blood pressure when getting up. Walk always in a well-lit room (use night lights in the walls). Avoid area rugs or power cords from appliances in the middle of the walkways. Use a  walker or a cane if necessary and consider physical therapy for balance exercise. Get your eyesight checked regularly.  FINANCIAL OVERSIGHT: Supervision, especially oversight when making financial decisions or transactions is also recommended.  HOME SAFETY: Consider the safety of the kitchen when operating appliances like stoves, microwave oven, and blender. Consider having supervision and share cooking responsibilities until no longer able to participate in those. Accidents with firearms and other hazards in the house should be identified and addressed as well.   ABILITY TO BE LEFT ALONE: If patient is unable to contact 911 operator, consider using LifeLine, or when the need is there, arrange for someone to stay with patients. Smoking is a fire hazard, consider supervision or cessation. Risk of wandering should be assessed by caregiver and if detected at any point, supervision and safe proof recommendations should be instituted.  MEDICATION SUPERVISION: Inability to self-administer medication needs to be constantly addressed. Implement a mechanism to ensure safe administration of the medications.        Mediterranean Diet A Mediterranean diet refers to food and lifestyle choices that are based on the traditions of countries located on the [employer]. This way of eating has been shown to help prevent certain conditions and improve outcomes for people who have chronic diseases, like kidney disease and heart disease. What are tips for following this plan? Lifestyle  Cook and eat meals together with your family, when possible. Drink enough fluid to keep your urine clear or pale yellow. Be physically active every day. This includes: Aerobic exercise like running or swimming. Leisure activities like gardening, walking, or housework. Get 7-8 hours of sleep each night. If recommended by your health care provider, drink red wine in moderation. This means 1 glass a day for nonpregnant women  and 2 glasses a day for men. A glass of wine equals 5 oz (150 mL). Reading food labels  Check the serving size of packaged foods. For foods such as rice and pasta, the serving size refers to the amount of cooked product, not dry. Check the total fat in packaged foods. Avoid foods that have saturated fat or trans fats. Check the ingredients list for added sugars, such as corn syrup. Shopping  At the grocery store, buy most of your food from the areas near the walls of the store. This includes: Fresh fruits and vegetables (produce). Grains, beans, nuts, and seeds. Some of these may be available in unpackaged forms or large amounts (in bulk). Fresh seafood. Poultry and eggs. Low-fat dairy products. Buy whole ingredients instead of prepackaged foods. Buy fresh fruits and vegetables in-season from local farmers markets. Buy frozen fruits and vegetables in resealable bags. If you do not have access to quality fresh seafood, buy precooked frozen shrimp or canned fish, such as tuna, salmon, or sardines. Buy small amounts of raw or cooked vegetables, salads, or olives from the deli or salad bar at your store. Stock your pantry so you always have certain foods on hand, such as olive oil, canned tuna, canned tomatoes, rice, pasta, and beans. Cooking  Cook foods with extra-virgin olive oil instead of using butter or other vegetable oils. Have meat as a side dish, and have vegetables or grains as your main dish. This means having  meat in small portions or adding small amounts of meat to foods like pasta or stew. Use beans or vegetables instead of meat in common dishes like chili or lasagna. Experiment with different cooking methods. Try roasting or broiling vegetables instead of steaming or sauteing them. Add frozen vegetables to soups, stews, pasta, or rice. Add nuts or seeds for added healthy fat at each meal. You can add these to yogurt, salads, or vegetable dishes. Marinate fish or vegetables using  olive oil, lemon juice, garlic, and fresh herbs. Meal planning  Plan to eat 1 vegetarian meal one day each week. Try to work up to 2 vegetarian meals, if possible. Eat seafood 2 or more times a week. Have healthy snacks readily available, such as: Vegetable sticks with hummus. Greek yogurt. Fruit and nut trail mix. Eat balanced meals throughout the week. This includes: Fruit: 2-3 servings a day Vegetables: 4-5 servings a day Low-fat dairy: 2 servings a day Fish, poultry, or lean meat: 1 serving a day Beans and legumes: 2 or more servings a week Nuts and seeds: 1-2 servings a day Whole grains: 6-8 servings a day Extra-virgin olive oil: 3-4 servings a day Limit red meat and sweets to only a few servings a month What are my food choices? Mediterranean diet Recommended Grains: Whole-grain pasta. Brown rice. Bulgar wheat. Polenta. Couscous. Whole-wheat bread. Orpah Cobb. Vegetables: Artichokes. Beets. Broccoli. Cabbage. Carrots. Eggplant. Green beans. Chard. Kale. Spinach. Onions. Leeks. Peas. Squash. Tomatoes. Peppers. Radishes. Fruits: Apples. Apricots. Avocado. Berries. Bananas. Cherries. Dates. Figs. Grapes. Lemons. Melon. Oranges. Peaches. Plums. Pomegranate. Meats and other protein foods: Beans. Almonds. Sunflower seeds. Pine nuts. Peanuts. Cod. Salmon. Scallops. Shrimp. Tuna. Tilapia. Clams. Oysters. Eggs. Dairy: Low-fat milk. Cheese. Greek yogurt. Beverages: Water. Red wine. Herbal tea. Fats and oils: Extra virgin olive oil. Avocado oil. Grape seed oil. Sweets and desserts: [place] yogurt with honey. Baked apples. Poached pears. Trail mix. Seasoning and other foods: Basil. Cilantro. Coriander. Cumin. Mint. Parsley. Sage. Rosemary. Tarragon. Garlic. Oregano. Thyme. Pepper. Balsalmic vinegar. Tahini. Hummus. Tomato sauce. Olives. Mushrooms. Limit these Grains: Prepackaged pasta or rice dishes. Prepackaged cereal with added sugar. Vegetables: Deep fried potatoes (french  fries). Fruits: Fruit canned in syrup. Meats and other protein foods: Beef. Pork. Lamb. Poultry with skin. Hot dogs. Tomasa Blase. Dairy: Ice cream. Sour cream. Whole milk. Beverages: Juice. Sugar-sweetened soft drinks. Beer. Liquor and spirits. Fats and oils: Butter. Canola oil. Vegetable oil. Beef fat (tallow). Lard. Sweets and desserts: Cookies. Cakes. Pies. Candy. Seasoning and other foods: Mayonnaise. Premade sauces and marinades. The items listed may not be a complete list. Talk with your dietitian about what dietary choices are right for you. Summary The Mediterranean diet includes both food and lifestyle choices. Eat a variety of fresh fruits and vegetables, beans, nuts, seeds, and whole grains. Limit the amount of red meat and sweets that you eat. Talk with your health care provider about whether it is safe for you to drink red wine in moderation. This means 1 glass a day for nonpregnant women and 2 glasses a day for men. A glass of wine equals 5 oz (150 mL). This information is not intended to replace advice given to you by your health care provider. Make sure you discuss any questions you have with your health care provider. Document Released: 12/30/2015 Document Revised: 02/01/2016 Document Reviewed: 12/30/2015 Elsevier Interactive Patient Education  2017 [employer].

## 2023-08-27 NOTE — Progress Notes (Signed)
 Assessment/Plan:   Mild Vascular Dementia     [name] is a very pleasant 89 y.o. 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 Patient is currently on memantine 10 mg twice a day, tolerating well.  He was recently hospitalized with GI bleed, experiencing significant drop in his H&H requiring transfusion, he was symptomatic for anemia.  He still feels weak but slowly recuperating.  Despite this, his MMSE today is 29/30.  He needs assistance with ADLs due to decreased mobility.  He no longer drives.  Mood is good.   Follow up in 6  months. Continue memantine 10 mg twice daily, side effects discussed Recommend good control of her cardiovascular risk factors Continue to control mood as per PCP Follow-up anemia with GI     Subjective:    This patient is accompanied in the office by his daughter-in-law who supplements the history.  Previous records as well as any outside records available were reviewed prior to todays visit. Patient was last seen on 05/09/2023 with MMSE 28/30.    Any changes in memory since last visit? " About the same".  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  He is also trying to stay in touch with his [place] friends. repeats oneself?  Endorsed Disoriented when walking into a room?  Patient denies   Leaving objects?  May misplace things but not in unusual places  Wandering behavior?  Denies.   Any personality changes since last visit? "He is quite, tired, especially after his GIB with Hb 8.7". "I am frustrated".  Any worsening depression?:  Denies.    Hallucinations or paranoia?  Denies.   Seizures? denies    Any sleep changes?  He continues to sleep on his lazy boy.  Denies vivid dreams, REM behavior or sleepwalking   Sleep apnea?   Endorsed, unable to use the CPAP. Uses his o2 at 2.5- 4 l.  Any hygiene concerns? Denies.  He needs assistance due to right frozen shoulder causing difficulty with mobility. Independent of bathing and dressing?  Needs assistance due to the above. Does the patient needs help with medications?  Daughter-in-law and his son in charge  Who is in charge of the finances?  Son is in charge    Any changes in appetite? "It  is good".    Patient have trouble swallowing? Denies.   Does the patient cook? No Any headaches?   denies   Chronic back pain  denies   Ambulates with difficulty?  He uses the walker for stability, he walks regularly and slowly, throughout the day  Recent falls or head injuries? denies     Unilateral weakness, numbness or tingling?  Denies any signs of a stroke.  He has a history of neuropathy on his toes Any tremors?  Only when low in oxygen below 80%. Any anosmia?  Denies   Any incontinence of urine?  Endorsed does not like to wear diapers or pads. Any bowel dysfunction?   Denies      Patient lives with daughter-in-law and his son  Does the patient drive? No longer drives  Neurocognitive Testing 03/30/22 Dr. Milbert Coulter Briefly, results suggested an isolated impairment surrounding attention/concentration. Performance variability was exhibited across processing speed, semantic fluency, and confrontation naming. However, it should be highlighted that testing was completed in English rather than his native [place] and that this fact by itself could certainly account for weaker scores across expressive language tasks rather than neurological decline. Despite what can be found in previous medical records, memory testing is certainly not suggestive of Alzheimer's disease as all aspects of memory testing  were appropriate when compared to age-matched peers. Regarding etiology, the most likely cause for ongoing dysfunction appears to be a combination of cerebrovascular disease and other complex medical ailments. 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 Testing patterns align with the expected impact of these ongoing ailments and neuroimaging findings well.     Initial visit 07/2021 The patient is seen in neurologic consultation at the request of Mila Palmer, MD for the evaluation of memory.  The patient is accompanied by his daughter-in-law who supplements the history.  His wife is on the phone to participate in supplement information. This is a 89 y.o. year old RH  male originally from [place] who initially reported having memory issues since December 2022, when he presented to the ED, with 4 day history of confusion.  Work-up was suspicious for hypoglycemia, versus TIA versus dementia.  After discharge, he was seen by his PCP, who referred him here for further evaluation.  After questioning, he reports that "the memory problems may have been longer than that, when I was in [place], I could not even remember [place] words which obviously I was fluent in ".  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  He used to exhibit his photos at [employer], and some of them are not familiar to him.  He finds himself repeating his questions and telling the same stories.  Sometimes he cannot remember long-term information, such as his sister's birthday.  He needs more time to think the answers than before.  He denies being disoriented when walking  into a room.  He denies leaving objects in unusual places.  He ambulates with some difficulty, he has a right frozen shoulder, and has some balance issues.  He denies any head injuries or recent falls.  [name] does the driving, as the patient has "several small strokes and diabetes, and when the low sugar comes, he shakes and closes worry".  The patient lives with his wife and his daughter-in-law who noticed the same changes.  His mood is overall stable, but lately, he has been more preoccupied, occasionally double and triple checking his wallet for example.  He denies depression, although he finds himself bored, seems he is computer has not been working for a few days, and "I am lost without it, waiting until he gets repaired ".  He does report some irritability due to the frustration of not being able to remember as before. He does not sleep well, or feels rested, because of his right frozen shoulder, which causes discomfort when he is moving in the bed.  He tried PT in the past, "it never worked ".  He denies vivid dreams, occasionally he may have  a nightmare, denies sleepwalking, hallucinations or paranoia.  He denies any hygiene concerns, he needs help with bathing and dressing because of the shoulder.  He has been always meticulous about his medications, but his daughter-in-law helps him monitor her.  He is wife is an Airline pilot, and has always been in charge of the finances.  His appetite is somewhat decreased "he is less enthusiastic about food lately because of the situation".  He denies trouble swallowing.  He admits to not drinking enough water.  He does not cook.  He denies any significant headaches, occasionally he may have a migraine which resolves quickly.  He denies double vision, dizziness, focal numbness, or tingling.  He has a right frozen shoulder as mentioned before, denies tremors or anosmia.  No history of seizures.  Denies urine incontinence, retention, he does have issues with  constipation, denies diarrhea.  20 years ago, he was diagnosed with OSA, but he never wanted to use his CPAP.  He denies alcohol or tobacco.  Family history of 1 aunt with Alzheimer's disease     CT of the head without contrast 04/22/2021 No acute intracranial process.. 2. Atrophy with extensive chronic microvascular ischemic changes.3. Chronic right maxillary sinus disease.    PREVIOUS MEDICATIONS:   CURRENT MEDICATIONS:  Outpatient Encounter Medications as of 08/27/2023  Medication Sig   acetaminophen (TYLENOL) 500 MG tablet Take 500 mg by mouth 2 (two) times daily as needed for moderate pain.   albuterol (VENTOLIN HFA) 108 (90 Base) MCG/ACT inhaler Inhale 2 puffs into the lungs every 4 (four) hours as needed for wheezing or shortness of breath.   bisacodyl (DULCOLAX) 5 MG EC tablet Take 5 mg by mouth daily as needed for severe constipation.   calcitRIOL (ROCALTROL) 0.25 MCG capsule Take 0.25 mcg by mouth 2 (two) times a week.   clopidogrel (PLAVIX) 75 MG tablet Take 1 tablet (75 mg total) by mouth daily. Restart 10/15   Coenzyme Q10 (COQ10) 150 MG CAPS Take 150 mg by mouth every evening.   Continuous Glucose Sensor (FREESTYLE LIBRE 3 SENSOR) MISC Apply topically.   docusate sodium (COLACE) 100 MG capsule Take 100 mg by mouth daily.   Iron, Ferrous Sulfate, 325 (65 Fe) MG TABS Take 325 mg by mouth daily.   levothyroxine (SYNTHROID, LEVOTHROID) 25 MCG tablet Take 25 mcg by mouth every morning.   memantine (NAMENDA) 10 MG tablet Take 1 tablet (10 mg total) by mouth 2 (two) times daily.   metoprolol succinate (TOPROL XL) 25 MG 24 hr tablet Take 1 tablet (25 mg total) by mouth daily.   Multiple Vitamin (RENAL MULTIVITAMIN/ZINC PO) Take 1 tablet by mouth daily.   nitroGLYCERIN (NITROSTAT) 0.4 MG SL tablet DISSOLVE ONE TABLET UNDER THE TONGUE EVERY 5 MINUTES AS NEEDED FOR CHEST PAIN.   NOVOLIN 70/30 KWIKPEN (70-30) 100 UNIT/ML KwikPen Inject into the skin 2 (two) times daily. Takes 50 units in the  morning and 13 units at night.   OVER THE COUNTER MEDICATION Take 2 tablets by mouth in the morning. Emma OTC medication   pantoprazole (PROTONIX) 40 MG tablet Take 1 tablet (40 mg total) by mouth 2 (two) times daily.   polyethylene glycol (MIRALAX / GLYCOLAX) 17 g packet Take 17 g by mouth daily.   rosuvastatin (CRESTOR) 40 MG tablet Take 1 tablet by mouth once daily   Facility-Administered Encounter Medications as of 08/27/2023  Medication   betamethasone acetate-betamethasone sodium phosphate (CELESTONE) injection 3 mg       08/27/2023  12:00 PM 05/09/2023   11:00 AM 10/19/2022    3:00 PM  MMSE - Mini Mental State Exam  Orientation to time 5 5 5   Orientation to Place 5 5 5   Registration 3 3 3   Attention/ Calculation 5 5 5   Recall 2 1 2   Language- name 2 objects 2 2 2   Language- repeat 1 1 1   Language- follow 3 step command 3 3 3   Language- read & follow direction 1 1 1   Write a sentence 1 1 0  Copy design 1 1 1   Total score 29 28 28       07/22/2021    7:00 AM  Montreal Cognitive Assessment   Visuospatial/ Executive (0/5) 4  Naming (0/3) 2  Attention: Read list of digits (0/2) 1  Attention: Read list of letters (0/1) 1  Attention: Serial 7 subtraction starting at 100 (0/3) 2  Language: Repeat phrase (0/2) 2  Language : Fluency (0/1) 0  Abstraction (0/2) 2  Delayed Recall (0/5) 0  Orientation (0/6) 6  Total 20  Adjusted Score (based on education) 20    Objective:     PHYSICAL EXAMINATION:    VITALS:   Vitals:   08/27/23 1144  BP: (!) 116/58  Pulse: 60  Resp: 20  SpO2: 98%  Weight: 228 lb (103.4 kg)    GEN:  The patient appears stated age and is in NAD. HEENT:  Normocephalic, atraumatic.   Neurological examination:  General: NAD, well-groomed, appears stated age. Orientation: The patient is alert. Oriented to person, place and date Cranial nerves: There is good facial symmetry.The speech is fluent and clear. No aphasia or dysarthria. Fund of knowledge  is appropriate. Recent and remote memory are impaired. Attention and concentration are normal.  Able to name objects and repeat phrases.  Hearing is reduced to conversational tone.  Sensation: Sensation is intact to light touch throughout Motor: Strength is at least antigravity x4. DTR's 2/4 in UE/LE     Movement examination: Tone: There is normal tone in the UE/LE Abnormal movements: Mild L intention tremor.  No myoclonus.  No asterixis.   Coordination:  There is no decremation with RAM's. Normal finger to nose  Gait and Station: The patient has some difficulty arising out of a deep-seated chair without the use of the hands. The patient's stride length is good that gets deconditioned walking.  Needs a walker to ambulate.  Gait is cautious and narrow.    Thank you for allowing [place] the opportunity to participate in the care of this nice patient. Please do not hesitate to contact us for any questions or concerns.   Total time spent on today's visit was 31 minutes dedicated to this patient today, preparing to see patient, examining the patient, ordering tests and/or medications and counseling the patient, documenting clinical information in the EHR or other health record, independently interpreting results and communicating results to the patient/family, discussing treatment and goals, answering patient's questions and coordinating care.  Cc:  Mila Palmer, MD  Marlowe Kays 08/27/2023 12:18 PM

## 2023-09-11 DIAGNOSIS — I5042 Chronic combined systolic (congestive) and diastolic (congestive) heart failure: Secondary | ICD-10-CM | POA: Diagnosis not present

## 2023-09-11 DIAGNOSIS — I2510 Atherosclerotic heart disease of native coronary artery without angina pectoris: Secondary | ICD-10-CM | POA: Diagnosis not present

## 2023-09-11 DIAGNOSIS — E1140 Type 2 diabetes mellitus with diabetic neuropathy, unspecified: Secondary | ICD-10-CM | POA: Diagnosis not present

## 2023-09-11 DIAGNOSIS — N184 Chronic kidney disease, stage 4 (severe): Secondary | ICD-10-CM | POA: Diagnosis not present

## 2023-09-19 DIAGNOSIS — I2510 Atherosclerotic heart disease of native coronary artery without angina pectoris: Secondary | ICD-10-CM | POA: Diagnosis not present

## 2023-09-19 DIAGNOSIS — I5042 Chronic combined systolic (congestive) and diastolic (congestive) heart failure: Secondary | ICD-10-CM | POA: Diagnosis not present

## 2023-09-19 DIAGNOSIS — N184 Chronic kidney disease, stage 4 (severe): Secondary | ICD-10-CM | POA: Diagnosis not present

## 2023-09-19 DIAGNOSIS — E1140 Type 2 diabetes mellitus with diabetic neuropathy, unspecified: Secondary | ICD-10-CM | POA: Diagnosis not present

## 2023-09-19 DIAGNOSIS — E039 Hypothyroidism, unspecified: Secondary | ICD-10-CM | POA: Diagnosis not present

## 2023-09-19 DIAGNOSIS — K922 Gastrointestinal hemorrhage, unspecified: Secondary | ICD-10-CM | POA: Diagnosis not present

## 2023-09-22 ENCOUNTER — Other Ambulatory Visit: Payer: Self-pay | Admitting: Emergency Medicine

## 2023-09-24 DIAGNOSIS — Z794 Long term (current) use of insulin: Secondary | ICD-10-CM | POA: Diagnosis not present

## 2023-09-24 DIAGNOSIS — E1142 Type 2 diabetes mellitus with diabetic polyneuropathy: Secondary | ICD-10-CM | POA: Diagnosis not present

## 2023-09-24 DIAGNOSIS — N184 Chronic kidney disease, stage 4 (severe): Secondary | ICD-10-CM | POA: Diagnosis not present

## 2023-09-24 DIAGNOSIS — E039 Hypothyroidism, unspecified: Secondary | ICD-10-CM | POA: Diagnosis not present

## 2023-09-24 DIAGNOSIS — I6930 Unspecified sequelae of cerebral infarction: Secondary | ICD-10-CM | POA: Diagnosis not present

## 2023-09-25 ENCOUNTER — Ambulatory Visit (INDEPENDENT_AMBULATORY_CARE_PROVIDER_SITE_OTHER): Payer: Medicare Other

## 2023-09-25 DIAGNOSIS — I495 Sick sinus syndrome: Secondary | ICD-10-CM

## 2023-09-26 LAB — CUP PACEART REMOTE DEVICE CHECK
Battery Remaining Longevity: 64 mo
Battery Remaining Percentage: 56 %
Battery Voltage: 2.98 V
Brady Statistic AP VP Percent: 21 %
Brady Statistic AP VS Percent: 1 %
Brady Statistic AS VP Percent: 79 %
Brady Statistic AS VS Percent: 1 %
Brady Statistic RA Percent Paced: 20 %
Brady Statistic RV Percent Paced: 99 %
Date Time Interrogation Session: 20250506210904
Implantable Lead Connection Status: 753985
Implantable Lead Connection Status: 753985
Implantable Lead Implant Date: 19901207
Implantable Lead Implant Date: 19901207
Implantable Lead Location: 753859
Implantable Lead Location: 753860
Implantable Pulse Generator Implant Date: 20201116
Lead Channel Impedance Value: 280 Ohm
Lead Channel Impedance Value: 510 Ohm
Lead Channel Pacing Threshold Amplitude: 0.5 V
Lead Channel Pacing Threshold Amplitude: 1 V
Lead Channel Pacing Threshold Pulse Width: 0.4 ms
Lead Channel Pacing Threshold Pulse Width: 0.4 ms
Lead Channel Sensing Intrinsic Amplitude: 12 mV
Lead Channel Sensing Intrinsic Amplitude: 2.3 mV
Lead Channel Setting Pacing Amplitude: 1.25 V
Lead Channel Setting Pacing Amplitude: 2 V
Lead Channel Setting Pacing Pulse Width: 0.4 ms
Lead Channel Setting Sensing Sensitivity: 3 mV
Pulse Gen Model: 2272
Pulse Gen Serial Number: 9177614

## 2023-09-30 ENCOUNTER — Encounter: Payer: Self-pay | Admitting: Cardiovascular Disease

## 2023-10-02 DIAGNOSIS — I129 Hypertensive chronic kidney disease with stage 1 through stage 4 chronic kidney disease, or unspecified chronic kidney disease: Secondary | ICD-10-CM | POA: Diagnosis not present

## 2023-10-02 DIAGNOSIS — E1122 Type 2 diabetes mellitus with diabetic chronic kidney disease: Secondary | ICD-10-CM | POA: Diagnosis not present

## 2023-10-02 DIAGNOSIS — N2581 Secondary hyperparathyroidism of renal origin: Secondary | ICD-10-CM | POA: Diagnosis not present

## 2023-10-02 DIAGNOSIS — N184 Chronic kidney disease, stage 4 (severe): Secondary | ICD-10-CM | POA: Diagnosis not present

## 2023-10-11 DIAGNOSIS — I2510 Atherosclerotic heart disease of native coronary artery without angina pectoris: Secondary | ICD-10-CM | POA: Diagnosis not present

## 2023-10-11 DIAGNOSIS — N184 Chronic kidney disease, stage 4 (severe): Secondary | ICD-10-CM | POA: Diagnosis not present

## 2023-10-11 DIAGNOSIS — I5042 Chronic combined systolic (congestive) and diastolic (congestive) heart failure: Secondary | ICD-10-CM | POA: Diagnosis not present

## 2023-10-11 DIAGNOSIS — E1140 Type 2 diabetes mellitus with diabetic neuropathy, unspecified: Secondary | ICD-10-CM | POA: Diagnosis not present

## 2023-10-17 ENCOUNTER — Telehealth: Payer: Self-pay

## 2023-10-17 NOTE — Telephone Encounter (Signed)
 Auth Submission: NO AUTH NEEDED Site of care: Site of care: [facility] Payer: Medicare A/B with BCBS supplement Medication & CPT/J Code(s) submitted: Feraheme (ferumoxytol) [id number] Route of submission (phone, fax, portal):  Phone # Fax # Auth type: Buy/Bill PB Units/visits requested: 510mg  x 2 doses Reference number:  Approval from: 10/17/23 to 06/21/24

## 2023-10-19 ENCOUNTER — Other Ambulatory Visit (HOSPITAL_COMMUNITY): Payer: Self-pay | Admitting: *Deleted

## 2023-10-20 DIAGNOSIS — E039 Hypothyroidism, unspecified: Secondary | ICD-10-CM | POA: Diagnosis not present

## 2023-10-20 DIAGNOSIS — I2510 Atherosclerotic heart disease of native coronary artery without angina pectoris: Secondary | ICD-10-CM | POA: Diagnosis not present

## 2023-10-20 DIAGNOSIS — E1140 Type 2 diabetes mellitus with diabetic neuropathy, unspecified: Secondary | ICD-10-CM | POA: Diagnosis not present

## 2023-10-20 DIAGNOSIS — I5042 Chronic combined systolic (congestive) and diastolic (congestive) heart failure: Secondary | ICD-10-CM | POA: Diagnosis not present

## 2023-10-20 DIAGNOSIS — N184 Chronic kidney disease, stage 4 (severe): Secondary | ICD-10-CM | POA: Diagnosis not present

## 2023-10-22 ENCOUNTER — Encounter (HOSPITAL_COMMUNITY)
Admission: RE | Admit: 2023-10-22 | Discharge: 2023-10-22 | Disposition: A | Discharge status: Home / Self Care | Source: Ambulatory Visit | Attending: Nephrology | Admitting: Nephrology

## 2023-10-22 DIAGNOSIS — D631 Anemia in chronic kidney disease: Secondary | ICD-10-CM | POA: Diagnosis not present

## 2023-10-22 DIAGNOSIS — N184 Chronic kidney disease, stage 4 (severe): Secondary | ICD-10-CM | POA: Insufficient documentation

## 2023-10-22 MED ORDER — SODIUM CHLORIDE 0.9 % IV SOLN
510.0000 mg | Freq: Once | INTRAVENOUS | Status: AC
  Administered 2023-10-22: 510 mg via INTRAVENOUS
  Filled 2023-10-22: qty 510

## 2023-11-07 NOTE — Addendum Note (Signed)
 Addended by: Lott Rouleau A on: 11/07/2023 10:48 AM   Modules accepted: Orders

## 2023-11-07 NOTE — Progress Notes (Signed)
 Remote pacemaker transmission.

## 2023-11-10 DIAGNOSIS — N184 Chronic kidney disease, stage 4 (severe): Secondary | ICD-10-CM | POA: Diagnosis not present

## 2023-11-10 DIAGNOSIS — I2510 Atherosclerotic heart disease of native coronary artery without angina pectoris: Secondary | ICD-10-CM | POA: Diagnosis not present

## 2023-11-10 DIAGNOSIS — I5042 Chronic combined systolic (congestive) and diastolic (congestive) heart failure: Secondary | ICD-10-CM | POA: Diagnosis not present

## 2023-11-10 DIAGNOSIS — E1140 Type 2 diabetes mellitus with diabetic neuropathy, unspecified: Secondary | ICD-10-CM | POA: Diagnosis not present

## 2023-11-11 ENCOUNTER — Other Ambulatory Visit: Payer: Self-pay | Admitting: Physician Assistant

## 2023-11-21 ENCOUNTER — Encounter: Payer: Self-pay | Admitting: Podiatry

## 2023-11-21 ENCOUNTER — Ambulatory Visit (INDEPENDENT_AMBULATORY_CARE_PROVIDER_SITE_OTHER): Admitting: Podiatry

## 2023-11-21 DIAGNOSIS — B351 Tinea unguium: Secondary | ICD-10-CM | POA: Diagnosis not present

## 2023-11-21 DIAGNOSIS — M79675 Pain in left toe(s): Secondary | ICD-10-CM | POA: Diagnosis not present

## 2023-11-21 DIAGNOSIS — Z794 Long term (current) use of insulin: Secondary | ICD-10-CM

## 2023-11-21 DIAGNOSIS — M79674 Pain in right toe(s): Secondary | ICD-10-CM

## 2023-11-21 DIAGNOSIS — E1140 Type 2 diabetes mellitus with diabetic neuropathy, unspecified: Secondary | ICD-10-CM

## 2023-11-21 NOTE — Progress Notes (Signed)
This patient returns to my office for at risk foot care.  This patient requires this care by a professional since this patient will be at risk due to having diabetes and chronic kidney disease and coagulation defect.  Patient is taking plavix. This patient is unable to cut nails himself since the patient cannot reach his nails.These nails are painful walking and wearing shoes.  This patient presents for at risk foot care today.  General Appearance  Alert, conversant and in no acute stress.  Vascular  Dorsalis pedis and posterior tibial  pulses are palpable  bilaterally.  Capillary return is within normal limits  bilaterally. Temperature is within normal limits  bilaterally.  Neurologic  Senn-Weinstein monofilament wire test within normal limits  bilaterally. Muscle power within normal limits bilaterally.  Nails Thick disfigured discolored nails with subungual debris  from hallux to fifth toes bilaterally. No evidence of bacterial infection or drainage bilaterally.  Orthopedic  No limitations of motion  feet .  No crepitus or effusions noted.  No bony pathology or digital deformities noted.  Skin  normotropic skin with no porokeratosis noted bilaterally.  No signs of infections or ulcers noted.     Onychomycosis  Pain in right toes  Pain in left toes  Consent was obtained for treatment procedures.   Mechanical debridement of nails 1-5  bilaterally performed with a nail nipper.  Filed with dremel without incident.    Return office visit   3 months                  Told patient to return for periodic foot care and evaluation due to potential at risk complications.   [name] DPM

## 2023-12-10 DIAGNOSIS — I2510 Atherosclerotic heart disease of native coronary artery without angina pectoris: Secondary | ICD-10-CM | POA: Diagnosis not present

## 2023-12-10 DIAGNOSIS — E1140 Type 2 diabetes mellitus with diabetic neuropathy, unspecified: Secondary | ICD-10-CM | POA: Diagnosis not present

## 2023-12-10 DIAGNOSIS — I5042 Chronic combined systolic (congestive) and diastolic (congestive) heart failure: Secondary | ICD-10-CM | POA: Diagnosis not present

## 2023-12-10 DIAGNOSIS — N184 Chronic kidney disease, stage 4 (severe): Secondary | ICD-10-CM | POA: Diagnosis not present

## 2023-12-17 DIAGNOSIS — N184 Chronic kidney disease, stage 4 (severe): Secondary | ICD-10-CM | POA: Diagnosis not present

## 2023-12-17 DIAGNOSIS — G309 Alzheimer's disease, unspecified: Secondary | ICD-10-CM | POA: Diagnosis not present

## 2023-12-17 DIAGNOSIS — I495 Sick sinus syndrome: Secondary | ICD-10-CM | POA: Diagnosis not present

## 2023-12-17 DIAGNOSIS — I6522 Occlusion and stenosis of left carotid artery: Secondary | ICD-10-CM | POA: Diagnosis not present

## 2023-12-17 DIAGNOSIS — D500 Iron deficiency anemia secondary to blood loss (chronic): Secondary | ICD-10-CM | POA: Diagnosis not present

## 2023-12-17 DIAGNOSIS — E1142 Type 2 diabetes mellitus with diabetic polyneuropathy: Secondary | ICD-10-CM | POA: Diagnosis not present

## 2023-12-20 DIAGNOSIS — E1140 Type 2 diabetes mellitus with diabetic neuropathy, unspecified: Secondary | ICD-10-CM | POA: Diagnosis not present

## 2023-12-20 DIAGNOSIS — I5042 Chronic combined systolic (congestive) and diastolic (congestive) heart failure: Secondary | ICD-10-CM | POA: Diagnosis not present

## 2023-12-20 DIAGNOSIS — N184 Chronic kidney disease, stage 4 (severe): Secondary | ICD-10-CM | POA: Diagnosis not present

## 2023-12-20 DIAGNOSIS — E039 Hypothyroidism, unspecified: Secondary | ICD-10-CM | POA: Diagnosis not present

## 2023-12-20 DIAGNOSIS — I2510 Atherosclerotic heart disease of native coronary artery without angina pectoris: Secondary | ICD-10-CM | POA: Diagnosis not present

## 2023-12-25 ENCOUNTER — Ambulatory Visit (INDEPENDENT_AMBULATORY_CARE_PROVIDER_SITE_OTHER): Payer: Medicare Other

## 2023-12-25 DIAGNOSIS — E1142 Type 2 diabetes mellitus with diabetic polyneuropathy: Secondary | ICD-10-CM | POA: Diagnosis not present

## 2023-12-25 DIAGNOSIS — I6930 Unspecified sequelae of cerebral infarction: Secondary | ICD-10-CM | POA: Diagnosis not present

## 2023-12-25 DIAGNOSIS — Z794 Long term (current) use of insulin: Secondary | ICD-10-CM | POA: Diagnosis not present

## 2023-12-25 DIAGNOSIS — E039 Hypothyroidism, unspecified: Secondary | ICD-10-CM | POA: Diagnosis not present

## 2023-12-25 DIAGNOSIS — N184 Chronic kidney disease, stage 4 (severe): Secondary | ICD-10-CM | POA: Diagnosis not present

## 2023-12-25 DIAGNOSIS — I495 Sick sinus syndrome: Secondary | ICD-10-CM | POA: Diagnosis not present

## 2023-12-26 LAB — CUP PACEART REMOTE DEVICE CHECK
Battery Remaining Longevity: 60 mo
Battery Remaining Percentage: 53 %
Battery Voltage: 2.98 V
Brady Statistic AP VP Percent: 25 %
Brady Statistic AP VS Percent: 1 %
Brady Statistic AS VP Percent: 74 %
Brady Statistic AS VS Percent: 1 %
Brady Statistic RA Percent Paced: 25 %
Brady Statistic RV Percent Paced: 99 %
Date Time Interrogation Session: 20250805040015
Implantable Lead Connection Status: 753985
Implantable Lead Connection Status: 753985
Implantable Lead Implant Date: 19901207
Implantable Lead Implant Date: 19901207
Implantable Lead Location: 753859
Implantable Lead Location: 753860
Implantable Pulse Generator Implant Date: 20201116
Lead Channel Impedance Value: 290 Ohm
Lead Channel Impedance Value: 550 Ohm
Lead Channel Pacing Threshold Amplitude: 0.5 V
Lead Channel Pacing Threshold Amplitude: 1.125 V
Lead Channel Pacing Threshold Pulse Width: 0.4 ms
Lead Channel Pacing Threshold Pulse Width: 0.4 ms
Lead Channel Sensing Intrinsic Amplitude: 12 mV
Lead Channel Sensing Intrinsic Amplitude: 2.3 mV
Lead Channel Setting Pacing Amplitude: 1.375
Lead Channel Setting Pacing Amplitude: 2 V
Lead Channel Setting Pacing Pulse Width: 0.4 ms
Lead Channel Setting Sensing Sensitivity: 3 mV
Pulse Gen Model: 2272
Pulse Gen Serial Number: 9177614

## 2023-12-27 ENCOUNTER — Ambulatory Visit: Payer: Self-pay | Admitting: Cardiovascular Disease

## 2024-01-09 DIAGNOSIS — E1140 Type 2 diabetes mellitus with diabetic neuropathy, unspecified: Secondary | ICD-10-CM | POA: Diagnosis not present

## 2024-01-09 DIAGNOSIS — N184 Chronic kidney disease, stage 4 (severe): Secondary | ICD-10-CM | POA: Diagnosis not present

## 2024-01-09 DIAGNOSIS — I5042 Chronic combined systolic (congestive) and diastolic (congestive) heart failure: Secondary | ICD-10-CM | POA: Diagnosis not present

## 2024-01-09 DIAGNOSIS — I2510 Atherosclerotic heart disease of native coronary artery without angina pectoris: Secondary | ICD-10-CM | POA: Diagnosis not present

## 2024-01-20 DIAGNOSIS — I5042 Chronic combined systolic (congestive) and diastolic (congestive) heart failure: Secondary | ICD-10-CM | POA: Diagnosis not present

## 2024-01-20 DIAGNOSIS — N184 Chronic kidney disease, stage 4 (severe): Secondary | ICD-10-CM | POA: Diagnosis not present

## 2024-01-20 DIAGNOSIS — I2510 Atherosclerotic heart disease of native coronary artery without angina pectoris: Secondary | ICD-10-CM | POA: Diagnosis not present

## 2024-01-20 DIAGNOSIS — E039 Hypothyroidism, unspecified: Secondary | ICD-10-CM | POA: Diagnosis not present

## 2024-01-20 DIAGNOSIS — E1140 Type 2 diabetes mellitus with diabetic neuropathy, unspecified: Secondary | ICD-10-CM | POA: Diagnosis not present

## 2024-01-23 DIAGNOSIS — N184 Chronic kidney disease, stage 4 (severe): Secondary | ICD-10-CM | POA: Diagnosis not present

## 2024-01-30 DIAGNOSIS — I129 Hypertensive chronic kidney disease with stage 1 through stage 4 chronic kidney disease, or unspecified chronic kidney disease: Secondary | ICD-10-CM | POA: Diagnosis not present

## 2024-01-30 DIAGNOSIS — E1122 Type 2 diabetes mellitus with diabetic chronic kidney disease: Secondary | ICD-10-CM | POA: Diagnosis not present

## 2024-01-30 DIAGNOSIS — N184 Chronic kidney disease, stage 4 (severe): Secondary | ICD-10-CM | POA: Diagnosis not present

## 2024-01-30 DIAGNOSIS — R809 Proteinuria, unspecified: Secondary | ICD-10-CM | POA: Diagnosis not present

## 2024-01-30 DIAGNOSIS — N2581 Secondary hyperparathyroidism of renal origin: Secondary | ICD-10-CM | POA: Diagnosis not present

## 2024-02-08 DIAGNOSIS — I2510 Atherosclerotic heart disease of native coronary artery without angina pectoris: Secondary | ICD-10-CM | POA: Diagnosis not present

## 2024-02-08 DIAGNOSIS — E1140 Type 2 diabetes mellitus with diabetic neuropathy, unspecified: Secondary | ICD-10-CM | POA: Diagnosis not present

## 2024-02-08 DIAGNOSIS — N184 Chronic kidney disease, stage 4 (severe): Secondary | ICD-10-CM | POA: Diagnosis not present

## 2024-02-08 DIAGNOSIS — I5042 Chronic combined systolic (congestive) and diastolic (congestive) heart failure: Secondary | ICD-10-CM | POA: Diagnosis not present

## 2024-02-10 ENCOUNTER — Other Ambulatory Visit: Payer: Self-pay | Admitting: Physician Assistant

## 2024-02-10 ENCOUNTER — Other Ambulatory Visit: Payer: Self-pay | Admitting: Cardiovascular Disease

## 2024-02-18 DIAGNOSIS — Z961 Presence of intraocular lens: Secondary | ICD-10-CM | POA: Diagnosis not present

## 2024-02-18 DIAGNOSIS — H04123 Dry eye syndrome of bilateral lacrimal glands: Secondary | ICD-10-CM | POA: Diagnosis not present

## 2024-02-18 DIAGNOSIS — H524 Presbyopia: Secondary | ICD-10-CM | POA: Diagnosis not present

## 2024-02-18 NOTE — Progress Notes (Signed)
 Remote PPM Transmission

## 2024-02-19 DIAGNOSIS — E1140 Type 2 diabetes mellitus with diabetic neuropathy, unspecified: Secondary | ICD-10-CM | POA: Diagnosis not present

## 2024-02-19 DIAGNOSIS — E039 Hypothyroidism, unspecified: Secondary | ICD-10-CM | POA: Diagnosis not present

## 2024-02-19 DIAGNOSIS — N184 Chronic kidney disease, stage 4 (severe): Secondary | ICD-10-CM | POA: Diagnosis not present

## 2024-02-19 DIAGNOSIS — I2510 Atherosclerotic heart disease of native coronary artery without angina pectoris: Secondary | ICD-10-CM | POA: Diagnosis not present

## 2024-02-19 DIAGNOSIS — I5042 Chronic combined systolic (congestive) and diastolic (congestive) heart failure: Secondary | ICD-10-CM | POA: Diagnosis not present

## 2024-02-20 ENCOUNTER — Ambulatory Visit: Admitting: Podiatry

## 2024-03-03 ENCOUNTER — Encounter: Payer: Self-pay | Admitting: Physician Assistant

## 2024-03-03 ENCOUNTER — Ambulatory Visit (INDEPENDENT_AMBULATORY_CARE_PROVIDER_SITE_OTHER): Admitting: Physician Assistant

## 2024-03-03 VITALS — BP 146/73 | HR 98 | Resp 20 | Wt 225.0 lb

## 2024-03-03 DIAGNOSIS — F01A0 Vascular dementia, mild, without behavioral disturbance, psychotic disturbance, mood disturbance, and anxiety: Secondary | ICD-10-CM | POA: Diagnosis not present

## 2024-03-03 MED ORDER — MEMANTINE HCL 10 MG PO TABS: 10.0000 mg | ORAL_TABLET | Freq: Two times a day (BID) | ORAL | 0 refills | Status: AC

## 2024-03-03 NOTE — Patient Instructions (Signed)
 It was a pleasure to see you today at our office.   Recommendations:  Follow up  in 6 months Continue Memantine  10 mg daily.        If you have any severe symptoms of a stroke, or other severe issues such as confusion,severe chills or fever, etc call 911 or go to the ER as you may need to be evaluated further       RECOMMENDATIONS FOR ALL PATIENTS WITH MEMORY PROBLEMS: 1. Continue to exercise (Recommend 30 minutes of walking everyday, or 3 hours every week) 2. Increase social interactions - continue going to [place] and enjoy social gatherings with friends and family 3. Eat healthy, avoid fried foods and eat more fruits and vegetables 4. Maintain adequate blood pressure, blood sugar, and blood cholesterol level. Reducing the risk of stroke and cardiovascular disease also helps promoting better memory. 5. Avoid stressful situations. Live a simple life and avoid aggravations. Organize your time and prepare for the next day in anticipation. 6. Sleep well, avoid any interruptions of sleep and avoid any distractions in the bedroom that may interfere with adequate sleep quality 7. Avoid sugar, avoid sweets as there is a strong link between excessive sugar intake, diabetes, and cognitive impairment We discussed the Mediterranean diet, which has been shown to help patients reduce the risk of progressive memory disorders and reduces cardiovascular risk. This includes eating fish, eat fruits and green leafy vegetables, nuts like almonds and hazelnuts, walnuts, and also use olive oil. Avoid fast foods and fried foods as much as possible. Avoid sweets and sugar as sugar use has been linked to worsening of memory function.  There is always a concern of gradual progression of memory problems. If this is the case, then we may need to adjust level of care according to patient needs. Support, both to the patient and caregiver, should then be put into place.    The Alzheimer's Association is here all day,  every day for people facing Alzheimer's disease through our free 24/7 Helpline: [phone]. The Helpline provides reliable information and support to all those who need assistance, such as individuals living with memory loss, Alzheimer's or other dementia, caregivers, health care professionals and the public.  Our highly trained and knowledgeable staff can help you with: Understanding memory loss, dementia and Alzheimer's  Medications and other treatment options  General information about aging and brain health  Skills to provide quality care and to find the best care from professionals  Legal, financial and living-arrangement decisions Our Helpline also features: Confidential care consultation provided by master's level clinicians who can help with decision-making support, crisis assistance and education on issues families face every day  Help in a caller's preferred language using our translation service that features more than 200 languages and dialects  Referrals to local community programs, services and ongoing support     FALL PRECAUTIONS: Be cautious when walking. Scan the area for obstacles that may increase the risk of trips and falls. When getting up in the mornings, sit up at the edge of the bed for a few minutes before getting out of bed. Consider elevating the bed at the head end to avoid drop of blood pressure when getting up. Walk always in a well-lit room (use night lights in the walls). Avoid area rugs or power cords from appliances in the middle of the walkways. Use a walker or a cane if necessary and consider physical therapy for balance exercise. Get your eyesight checked regularly.  FINANCIAL OVERSIGHT:  Supervision, especially oversight when making financial decisions or transactions is also recommended.  HOME SAFETY: Consider the safety of the kitchen when operating appliances like stoves, microwave oven, and blender. Consider having supervision and share cooking  responsibilities until no longer able to participate in those. Accidents with firearms and other hazards in the house should be identified and addressed as well.   ABILITY TO BE LEFT ALONE: If patient is unable to contact 911 operator, consider using LifeLine, or when the need is there, arrange for someone to stay with patients. Smoking is a fire hazard, consider supervision or cessation. Risk of wandering should be assessed by caregiver and if detected at any point, supervision and safe proof recommendations should be instituted.  MEDICATION SUPERVISION: Inability to self-administer medication needs to be constantly addressed. Implement a mechanism to ensure safe administration of the medications.        Mediterranean Diet A Mediterranean diet refers to food and lifestyle choices that are based on the traditions of countries located on the [employer]. This way of eating has been shown to help prevent certain conditions and improve outcomes for people who have chronic diseases, like kidney disease and heart disease. What are tips for following this plan? Lifestyle  Cook and eat meals together with your family, when possible. Drink enough fluid to keep your urine clear or pale yellow. Be physically active every day. This includes: Aerobic exercise like running or swimming. Leisure activities like gardening, walking, or housework. Get 7-8 hours of sleep each night. If recommended by your health care provider, drink red wine in moderation. This means 1 glass a day for nonpregnant women and 2 glasses a day for men. A glass of wine equals 5 oz (150 mL). Reading food labels  Check the serving size of packaged foods. For foods such as rice and pasta, the serving size refers to the amount of cooked product, not dry. Check the total fat in packaged foods. Avoid foods that have saturated fat or trans fats. Check the ingredients list for added sugars, such as corn syrup. Shopping  At the  grocery store, buy most of your food from the areas near the walls of the store. This includes: Fresh fruits and vegetables (produce). Grains, beans, nuts, and seeds. Some of these may be available in unpackaged forms or large amounts (in bulk). Fresh seafood. Poultry and eggs. Low-fat dairy products. Buy whole ingredients instead of prepackaged foods. Buy fresh fruits and vegetables in-season from local farmers markets. Buy frozen fruits and vegetables in resealable bags. If you do not have access to quality fresh seafood, buy precooked frozen shrimp or canned fish, such as tuna, salmon, or sardines. Buy small amounts of raw or cooked vegetables, salads, or olives from the deli or salad bar at your store. Stock your pantry so you always have certain foods on hand, such as olive oil, canned tuna, canned tomatoes, rice, pasta, and beans. Cooking  Cook foods with extra-virgin olive oil instead of using butter or other vegetable oils. Have meat as a side dish, and have vegetables or grains as your main dish. This means having meat in small portions or adding small amounts of meat to foods like pasta or stew. Use beans or vegetables instead of meat in common dishes like chili or lasagna. Experiment with different cooking methods. Try roasting or broiling vegetables instead of steaming or sauteing them. Add frozen vegetables to soups, stews, pasta, or rice. Add nuts or seeds for added healthy fat at each  meal. You can add these to yogurt, salads, or vegetable dishes. Marinate fish or vegetables using olive oil, lemon juice, garlic, and fresh herbs. Meal planning  Plan to eat 1 vegetarian meal one day each week. Try to work up to 2 vegetarian meals, if possible. Eat seafood 2 or more times a week. Have healthy snacks readily available, such as: Vegetable sticks with hummus. Greek yogurt. Fruit and nut trail mix. Eat balanced meals throughout the week. This includes: Fruit: 2-3 servings a  day Vegetables: 4-5 servings a day Low-fat dairy: 2 servings a day Fish, poultry, or lean meat: 1 serving a day Beans and legumes: 2 or more servings a week Nuts and seeds: 1-2 servings a day Whole grains: 6-8 servings a day Extra-virgin olive oil: 3-4 servings a day Limit red meat and sweets to only a few servings a month What are my food choices? Mediterranean diet Recommended Grains: Whole-grain pasta. Brown rice. Bulgar wheat. Polenta. Couscous. Whole-wheat bread. [place]. Vegetables: Artichokes. Beets. Broccoli. Cabbage. Carrots. Eggplant. Green beans. Chard. Kale. Spinach. Onions. Leeks. Peas. Squash. Tomatoes. Peppers. Radishes. Fruits: Apples. Apricots. Avocado. Berries. Bananas. Cherries. Dates. Figs. Grapes. Lemons. Melon. Oranges. Peaches. Plums. Pomegranate. Meats and other protein foods: Beans. Almonds. Sunflower seeds. Pine nuts. Peanuts. Cod. Salmon. Scallops. Shrimp. Tuna. Tilapia. Clams. Oysters. Eggs. Dairy: Low-fat milk. Cheese. Greek yogurt. Beverages: Water. Red wine. Herbal tea. Fats and oils: Extra virgin olive oil. Avocado oil. Grape seed oil. Sweets and desserts: [place] yogurt with honey. Baked apples. Poached pears. Trail mix. Seasoning and other foods: Basil. Cilantro. Coriander. Cumin. Mint. Parsley. Sage. Rosemary. Tarragon. Garlic. Oregano. Thyme. Pepper. Balsalmic vinegar. Tahini. Hummus. Tomato sauce. Olives. Mushrooms. Limit these Grains: Prepackaged pasta or rice dishes. Prepackaged cereal with added sugar. Vegetables: Deep fried potatoes (french fries). Fruits: Fruit canned in syrup. Meats and other protein foods: Beef. Pork. Lamb. Poultry with skin. Hot dogs. [place]. Dairy: Ice cream. Sour cream. Whole milk. Beverages: Juice. Sugar-sweetened soft drinks. Beer. Liquor and spirits. Fats and oils: Butter. Canola oil. Vegetable oil. Beef fat (tallow). Lard. Sweets and desserts: Cookies. Cakes. Pies. Candy. Seasoning and other foods: Mayonnaise.  Premade sauces and marinades. The items listed may not be a complete list. Talk with your dietitian about what dietary choices are right for you. Summary The Mediterranean diet includes both food and lifestyle choices. Eat a variety of fresh fruits and vegetables, beans, nuts, seeds, and whole grains. Limit the amount of red meat and sweets that you eat. Talk with your health care provider about whether it is safe for you to drink red wine in moderation. This means 1 glass a day for nonpregnant women and 2 glasses a day for men. A glass of wine equals 5 oz (150 mL). This information is not intended to replace advice given to you by your health care provider. Make sure you discuss any questions you have with your health care provider. Document Released: 12/30/2015 Document Revised: 02/01/2016 Document Reviewed: 12/30/2015 Elsevier Interactive Patient Education  2017 [employer].

## 2024-03-03 NOTE — Progress Notes (Signed)
 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 Memory is stable with MMSE at 30/30. Patient is currently on memantine  10 mg twice daily, tolerating well.   Patient needs assistance with ADLs due to decreased mobility.  He no longer drives.  Mood is good.       Follow up in 6 months. Continue memantine  10 mg twice daily, side effects discussed  Recommend good control of her cardiovascular risk factors Continue to control mood as per PCP Follow-up anemia with GI     Subjective:    This patient is accompanied in the office by his daughter-in-law who supplements the history.  Previous records as well as any outside records available were reviewed prior to todays visit. Patient was last seen on 08/27/2023 with MMSE 29/30.    Any changes in memory since last visit?  Pretty good.  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 LTM is fair, better since his Hb improved, currently at 14 repeats oneself?  Endorsed Disoriented when walking into a room? Denies    Leaving objects?  May misplace things but not in unusual places   Wandering behavior?  denies   Any personality changes since last visit?  He has moments of irritability.   Any worsening depression?:  Denies.   Hallucinations or paranoia?  Denies.   Seizures? denies    Any sleep changes?  He continues to sleep in his  Lazy-Boy, has vivid dreams but not  nightmares, denies REM behavior or sleepwalking   Sleep apnea?   Endorsed, unable to use a sleep.  Uses his oxygen  at 2 to 4 L Any hygiene concerns?  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  Daughter-in-law and son are in charge    Who is in charge of the finances?  Son is in charge     Any changes in appetite?  denies it is good, excellent    Patient have trouble swallowing? Denies.   Does the patient cook? No Any headaches?   denies   Any vision changes?denies  Chronic back pain  denies   Ambulates with difficulty?  Endorsed, he uses the walker for stability, he walks slowly but regularly throughout the day  Recent falls or head injuries? Denies.     Unilateral weakness, numbness or tingling?  Denies any new signs of a stroke.  He has been history of neuropathy on his toes Any tremors?  Only when the oxygen  is below 80%   Any anosmia?  Denies   Any incontinence of urine?  Endorsed   Any bowel dysfunction?   Denies      Patient lives with his daughter-in-law and his son         Neurocognitive Testing 03/30/22 Dr. Richie Briefly, results suggested an isolated impairment surrounding attention/concentration. Performance variability was exhibited across processing speed, semantic fluency, and confrontation naming. However, it  should be highlighted that testing was completed in English rather than his native [place] and that this fact by itself could certainly account for weaker scores across expressive language tasks rather than neurological decline. Despite what can be found in previous medical records, memory testing is certainly not suggestive of Alzheimer's disease as all aspects of memory testing were appropriate when compared to age-matched peers. Regarding etiology, the most likely cause for ongoing dysfunction appears to be a combination of cerebrovascular disease and other complex  medical ailments. A previous head CT suggested extensive microvascular ischemic disease and he has numerous medical ailments in his medical history which can worsen cognitive functioning (e.g., cardiomyopathy, coronary artery disease, complete heart block, heart failure, hyperlipidemia, type II diabetes, untreated obstructive sleep apnea). Testing patterns align with the expected impact of these ongoing ailments and neuroimaging findings well.     Initial visit 07/2021 The patient is seen in neurologic consultation at the request of Verena Mems, MD for the evaluation of memory.  The patient is accompanied by his daughter-in-law who supplements the history.  His wife is on the phone to participate in supplement information. This is a 89 y.o. year old RH  male originally from [place] who initially reported having memory issues since December 2022, when he presented to the ED, with 4 day history of confusion.  Work-up was suspicious for hypoglycemia, versus TIA versus dementia.  After discharge, he was seen by his PCP, who referred him here for further evaluation.  After questioning, he reports that the memory problems may have been longer than that, when I was in [place], I could not even remember [place] words which obviously I was fluent in .  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  He used to exhibit his photos at [employer], and some of them are not familiar to him.  He finds himself repeating his questions and telling the same stories.  Sometimes he cannot remember long-term information, such as his sister's birthday.  He needs more time to think the answers than before.  He denies being disoriented when walking into a room.  He denies leaving objects in unusual places.  He ambulates with some difficulty, he has a right frozen shoulder, and has some balance issues.  He denies any head injuries or  recent falls.  [name] does the driving, as the patient has several small strokes and diabetes, and when the low sugar comes, he shakes and closes worry.  The patient lives with his wife and his daughter-in-law who noticed the same changes.  His mood is overall stable, but lately, he has been more preoccupied, occasionally double and triple checking his wallet for example.  He denies depression, although he finds himself bored, seems he is computer has not been working for a few days, and I am lost without it, waiting until he gets repaired .  He does report some irritability due to the frustration of not being able to remember as before. He does not sleep well, or feels rested, because of his right frozen shoulder, which causes discomfort when he is moving in the bed.  He tried PT in the past, it never worked .  He denies vivid dreams, occasionally he may have a nightmare, denies sleepwalking, hallucinations or paranoia.  He denies any hygiene concerns, he needs help with bathing and dressing because of the shoulder.  He has  been always meticulous about his medications, but his daughter-in-law helps him monitor her.  He is wife is an Airline pilot, and has always been in charge of the finances.  His appetite is somewhat decreased he is less enthusiastic about food lately because of the situation.  He denies trouble swallowing.  He admits to not drinking enough water.  He does not cook.  He denies any significant headaches, occasionally he may have a migraine which resolves quickly.  He denies double vision, dizziness, focal numbness, or tingling.  He has a right frozen shoulder as mentioned before, denies tremors or anosmia.  No history of seizures.  Denies urine incontinence, retention, he does have issues with constipation, denies diarrhea.  20 years ago, he was diagnosed with OSA, but he never wanted to use his CPAP.  He denies alcohol  or tobacco.  Family history of 1 aunt with Alzheimer's  disease     CT of the head without contrast 04/22/2021 No acute intracranial process.. 2. Atrophy with extensive chronic microvascular ischemic changes.3. Chronic right maxillary sinus disease.   PREVIOUS MEDICATIONS:   CURRENT MEDICATIONS:  Outpatient Encounter Medications as of 03/03/2024  Medication Sig   acetaminophen  (TYLENOL ) 500 MG tablet Take 500 mg by mouth 2 (two) times daily as needed for moderate pain.   albuterol  (VENTOLIN  HFA) 108 (90 Base) MCG/ACT inhaler Inhale 2 puffs into the lungs every 4 (four) hours as needed for wheezing or shortness of breath.   bisacodyl  (DULCOLAX) 5 MG EC tablet Take 5 mg by mouth daily as needed for severe constipation.   clopidogrel  (PLAVIX ) 75 MG tablet Take 1 tablet (75 mg total) by mouth daily. Restart 10/15   Coenzyme Q10 (COQ10) 150 MG CAPS Take 150 mg by mouth every evening.   Continuous Glucose Sensor (FREESTYLE LIBRE 3 SENSOR) MISC Apply topically.   docusate sodium  (COLACE) 100 MG capsule Take 100 mg by mouth daily.   Iron , Ferrous Sulfate , 325 (65 Fe) MG TABS Take 325 mg by mouth daily.   levothyroxine  (SYNTHROID , LEVOTHROID) 25 MCG tablet Take 25 mcg by mouth every morning.   metoprolol  succinate (TOPROL -XL) 25 MG 24 hr tablet Take 1 tablet by mouth once daily   Multiple Vitamin (RENAL MULTIVITAMIN/ZINC  PO) Take 1 tablet by mouth daily.   NOVOLIN 70/30 KWIKPEN (70-30) 100 UNIT/ML KwikPen Inject into the skin 2 (two) times daily. Takes 50 units in the morning and 13 units at night. (Patient taking differently: Inject into the skin 2 (two) times daily. Takes 50 units in the morning and 13 units at night.now taken 10/13 40 units and 8)   OVER THE COUNTER MEDICATION Take 2 tablets by mouth in the morning. Emma OTC medication   pantoprazole  (PROTONIX ) 40 MG tablet Take 1 tablet (40 mg total) by mouth 2 (two) times daily.   polyethylene glycol (MIRALAX  / GLYCOLAX ) 17 g packet Take 17 g by mouth daily.   rosuvastatin  (CRESTOR ) 40 MG tablet Take  1 tablet by mouth once daily   [DISCONTINUED] memantine  (NAMENDA ) 10 MG tablet Take 1 tablet by mouth twice daily   calcitRIOL  (ROCALTROL ) 0.25 MCG capsule Take 0.25 mcg by mouth 2 (two) times a week.   memantine  (NAMENDA ) 10 MG tablet Take 1 tablet (10 mg total) by mouth 2 (two) times daily.   nitroGLYCERIN  (NITROSTAT ) 0.4 MG SL tablet DISSOLVE ONE TABLET UNDER THE TONGUE EVERY 5 MINUTES AS NEEDED FOR CHEST PAIN. (Patient not taking: Reported on 03/03/2024)   Facility-Administered Encounter Medications as of 03/03/2024  Medication  betamethasone  acetate-betamethasone  sodium phosphate  (CELESTONE ) injection 3 mg       08/27/2023   12:00 PM 05/09/2023   11:00 AM 10/19/2022    3:00 PM  MMSE - Mini Mental State Exam  Orientation to time 5 5 5   Orientation to Place 5 5 5   Registration 3 3 3   Attention/ Calculation 5 5 5   Recall 2 1 2   Language- name 2 objects 2 2 2   Language- repeat 1 1 1   Language- follow 3 step command 3 3 3   Language- read & follow direction 1 1 1   Write a sentence 1 1 0  Copy design 1 1 1   Total score 29 28 28       07/22/2021    7:00 AM  Montreal Cognitive Assessment   Visuospatial/ Executive (0/5) 4  Naming (0/3) 2  Attention: Read list of digits (0/2) 1  Attention: Read list of letters (0/1) 1  Attention: Serial 7 subtraction starting at 100 (0/3) 2  Language: Repeat phrase (0/2) 2  Language : Fluency (0/1) 0  Abstraction (0/2) 2  Delayed Recall (0/5) 0  Orientation (0/6) 6  Total 20  Adjusted Score (based on education) 20    Objective:     PHYSICAL EXAMINATION:    VITALS:   Vitals:   03/03/24 0924  BP: (!) 146/73  Pulse: 98  Resp: 20  SpO2: 96%  Weight: 225 lb (102.1 kg)    GEN:  The patient appears stated age and is in NAD. HEENT:  Normocephalic, atraumatic.   Neurological examination:  General: NAD, well-groomed, appears stated age. Orientation: The patient is alert. Oriented to person, place and date Cranial nerves: There is good  facial symmetry.The speech is fluent and clear. No aphasia or dysarthria. Fund of knowledge is appropriate. Recent and remote memory are impaired. Attention and concentration are reduced. Able to name objects and repeat phrases.  Hearing is reduced to conversational tone.   Sensation: Sensation is intact to light touch throughout Motor: Strength is at least antigravity x4. DTR's 2/4 in UE/LE     Movement examination: Tone: There is normal tone in the UE/LE Abnormal movements: Mild left intention tremor.  No myoclonus.  No asterixis.   Coordination:  There is no decremation with RAM's. Normal finger to nose  Gait and Station: The patient difficulty arising out of a deep-seated chair without the use of the hands. The patient's stride length is good but gets very deconditioned with walking, uses a walker stability.  Gait is cautious and narrow.    Thank you for allowing us  the opportunity to participate in the care of this nice patient. Please do not hesitate to contact us  for any questions or concerns.   Total time spent on today's visit was 25 minutes dedicated to this patient today, preparing to see patient, examining the patient, ordering tests and/or medications and counseling the patient, documenting clinical information in the EHR or other health record, independently interpreting results and communicating results to the patient/family, discussing treatment and goals, answering patient's questions and coordinating care.  Cc:  Verena Mems, MD  Camie Sevin 03/03/2024 9:54 AM

## 2024-03-07 ENCOUNTER — Other Ambulatory Visit: Payer: Self-pay | Admitting: Cardiovascular Disease

## 2024-03-09 DIAGNOSIS — N184 Chronic kidney disease, stage 4 (severe): Secondary | ICD-10-CM | POA: Diagnosis not present

## 2024-03-09 DIAGNOSIS — I2510 Atherosclerotic heart disease of native coronary artery without angina pectoris: Secondary | ICD-10-CM | POA: Diagnosis not present

## 2024-03-09 DIAGNOSIS — I5042 Chronic combined systolic (congestive) and diastolic (congestive) heart failure: Secondary | ICD-10-CM | POA: Diagnosis not present

## 2024-03-09 DIAGNOSIS — E1140 Type 2 diabetes mellitus with diabetic neuropathy, unspecified: Secondary | ICD-10-CM | POA: Diagnosis not present

## 2024-03-13 ENCOUNTER — Emergency Department (HOSPITAL_COMMUNITY)

## 2024-03-13 ENCOUNTER — Encounter (HOSPITAL_COMMUNITY): Payer: Self-pay

## 2024-03-13 ENCOUNTER — Other Ambulatory Visit: Payer: Self-pay

## 2024-03-13 ENCOUNTER — Inpatient Hospital Stay (HOSPITAL_COMMUNITY)
Admission: EM | Admit: 2024-03-13 | Discharge: 2024-03-16 | DRG: 065 | Disposition: A | Discharge status: Home Health | Attending: Internal Medicine | Admitting: Internal Medicine

## 2024-03-13 DIAGNOSIS — E1142 Type 2 diabetes mellitus with diabetic polyneuropathy: Secondary | ICD-10-CM | POA: Diagnosis present

## 2024-03-13 DIAGNOSIS — I495 Sick sinus syndrome: Secondary | ICD-10-CM | POA: Diagnosis present

## 2024-03-13 DIAGNOSIS — R29818 Other symptoms and signs involving the nervous system: Secondary | ICD-10-CM | POA: Diagnosis not present

## 2024-03-13 DIAGNOSIS — E66813 Obesity, class 3: Secondary | ICD-10-CM | POA: Diagnosis present

## 2024-03-13 DIAGNOSIS — I2510 Atherosclerotic heart disease of native coronary artery without angina pectoris: Secondary | ICD-10-CM | POA: Diagnosis present

## 2024-03-13 DIAGNOSIS — F32A Depression, unspecified: Secondary | ICD-10-CM | POA: Diagnosis present

## 2024-03-13 DIAGNOSIS — Z87891 Personal history of nicotine dependence: Secondary | ICD-10-CM

## 2024-03-13 DIAGNOSIS — Z7982 Long term (current) use of aspirin: Secondary | ICD-10-CM

## 2024-03-13 DIAGNOSIS — I130 Hypertensive heart and chronic kidney disease with heart failure and stage 1 through stage 4 chronic kidney disease, or unspecified chronic kidney disease: Secondary | ICD-10-CM | POA: Diagnosis present

## 2024-03-13 DIAGNOSIS — I480 Paroxysmal atrial fibrillation: Secondary | ICD-10-CM | POA: Diagnosis present

## 2024-03-13 DIAGNOSIS — Z6841 Body Mass Index (BMI) 40.0 and over, adult: Secondary | ICD-10-CM | POA: Diagnosis not present

## 2024-03-13 DIAGNOSIS — R29701 NIHSS score 1: Secondary | ICD-10-CM | POA: Diagnosis not present

## 2024-03-13 DIAGNOSIS — N184 Chronic kidney disease, stage 4 (severe): Secondary | ICD-10-CM | POA: Diagnosis present

## 2024-03-13 DIAGNOSIS — Z7902 Long term (current) use of antithrombotics/antiplatelets: Secondary | ICD-10-CM

## 2024-03-13 DIAGNOSIS — M5410 Radiculopathy, site unspecified: Secondary | ICD-10-CM

## 2024-03-13 DIAGNOSIS — I639 Cerebral infarction, unspecified: Secondary | ICD-10-CM | POA: Diagnosis not present

## 2024-03-13 DIAGNOSIS — I7090 Unspecified atherosclerosis: Secondary | ICD-10-CM | POA: Diagnosis not present

## 2024-03-13 DIAGNOSIS — I6381 Other cerebral infarction due to occlusion or stenosis of small artery: Secondary | ICD-10-CM | POA: Diagnosis present

## 2024-03-13 DIAGNOSIS — R531 Weakness: Secondary | ICD-10-CM | POA: Diagnosis not present

## 2024-03-13 DIAGNOSIS — I340 Nonrheumatic mitral (valve) insufficiency: Secondary | ICD-10-CM | POA: Diagnosis not present

## 2024-03-13 DIAGNOSIS — R29810 Facial weakness: Secondary | ICD-10-CM | POA: Diagnosis not present

## 2024-03-13 DIAGNOSIS — I5042 Chronic combined systolic (congestive) and diastolic (congestive) heart failure: Secondary | ICD-10-CM | POA: Diagnosis present

## 2024-03-13 DIAGNOSIS — K219 Gastro-esophageal reflux disease without esophagitis: Secondary | ICD-10-CM | POA: Diagnosis present

## 2024-03-13 DIAGNOSIS — Z794 Long term (current) use of insulin: Secondary | ICD-10-CM

## 2024-03-13 DIAGNOSIS — R201 Hypoesthesia of skin: Secondary | ICD-10-CM | POA: Diagnosis present

## 2024-03-13 DIAGNOSIS — R2990 Unspecified symptoms and signs involving the nervous system: Secondary | ICD-10-CM | POA: Diagnosis present

## 2024-03-13 DIAGNOSIS — E1122 Type 2 diabetes mellitus with diabetic chronic kidney disease: Secondary | ICD-10-CM | POA: Diagnosis present

## 2024-03-13 DIAGNOSIS — R29898 Other symptoms and signs involving the musculoskeletal system: Principal | ICD-10-CM

## 2024-03-13 DIAGNOSIS — M47812 Spondylosis without myelopathy or radiculopathy, cervical region: Secondary | ICD-10-CM | POA: Diagnosis not present

## 2024-03-13 DIAGNOSIS — E785 Hyperlipidemia, unspecified: Secondary | ICD-10-CM | POA: Diagnosis not present

## 2024-03-13 DIAGNOSIS — I442 Atrioventricular block, complete: Secondary | ICD-10-CM | POA: Diagnosis present

## 2024-03-13 DIAGNOSIS — E782 Mixed hyperlipidemia: Secondary | ICD-10-CM | POA: Diagnosis present

## 2024-03-13 DIAGNOSIS — Z950 Presence of cardiac pacemaker: Secondary | ICD-10-CM | POA: Diagnosis not present

## 2024-03-13 DIAGNOSIS — F01A3 Vascular dementia, mild, with mood disturbance: Secondary | ICD-10-CM | POA: Diagnosis present

## 2024-03-13 DIAGNOSIS — R200 Anesthesia of skin: Secondary | ICD-10-CM | POA: Diagnosis not present

## 2024-03-13 DIAGNOSIS — I6782 Cerebral ischemia: Secondary | ICD-10-CM | POA: Diagnosis not present

## 2024-03-13 DIAGNOSIS — H903 Sensorineural hearing loss, bilateral: Secondary | ICD-10-CM | POA: Diagnosis present

## 2024-03-13 DIAGNOSIS — E039 Hypothyroidism, unspecified: Secondary | ICD-10-CM | POA: Diagnosis present

## 2024-03-13 DIAGNOSIS — F0150 Vascular dementia without behavioral disturbance: Secondary | ICD-10-CM | POA: Diagnosis not present

## 2024-03-13 DIAGNOSIS — Z79899 Other long term (current) drug therapy: Secondary | ICD-10-CM

## 2024-03-13 DIAGNOSIS — G4733 Obstructive sleep apnea (adult) (pediatric): Secondary | ICD-10-CM | POA: Diagnosis present

## 2024-03-13 DIAGNOSIS — G8324 Monoplegia of upper limb affecting left nondominant side: Secondary | ICD-10-CM | POA: Diagnosis present

## 2024-03-13 DIAGNOSIS — F01A4 Vascular dementia, mild, with anxiety: Secondary | ICD-10-CM | POA: Diagnosis present

## 2024-03-13 DIAGNOSIS — Z8249 Family history of ischemic heart disease and other diseases of the circulatory system: Secondary | ICD-10-CM

## 2024-03-13 DIAGNOSIS — I361 Nonrheumatic tricuspid (valve) insufficiency: Secondary | ICD-10-CM | POA: Diagnosis not present

## 2024-03-13 DIAGNOSIS — Z955 Presence of coronary angioplasty implant and graft: Secondary | ICD-10-CM

## 2024-03-13 DIAGNOSIS — Z8673 Personal history of transient ischemic attack (TIA), and cerebral infarction without residual deficits: Secondary | ICD-10-CM

## 2024-03-13 DIAGNOSIS — E1151 Type 2 diabetes mellitus with diabetic peripheral angiopathy without gangrene: Secondary | ICD-10-CM | POA: Diagnosis not present

## 2024-03-13 DIAGNOSIS — I672 Cerebral atherosclerosis: Secondary | ICD-10-CM | POA: Diagnosis present

## 2024-03-13 DIAGNOSIS — I255 Ischemic cardiomyopathy: Secondary | ICD-10-CM | POA: Diagnosis present

## 2024-03-13 DIAGNOSIS — Z79890 Hormone replacement therapy (postmenopausal): Secondary | ICD-10-CM

## 2024-03-13 DIAGNOSIS — M1990 Unspecified osteoarthritis, unspecified site: Secondary | ICD-10-CM | POA: Diagnosis present

## 2024-03-13 LAB — COMPREHENSIVE METABOLIC PANEL WITH GFR
ALT: 16 U/L (ref 0–44)
AST: 30 U/L (ref 15–41)
Albumin: 3.7 g/dL (ref 3.5–5.0)
Alkaline Phosphatase: 62 U/L (ref 38–126)
Anion gap: 8 (ref 5–15)
BUN: 43 mg/dL — ABNORMAL HIGH (ref 8–23)
CO2: 26 mmol/L (ref 22–32)
Calcium: 9 mg/dL (ref 8.9–10.3)
Chloride: 104 mmol/L (ref 98–111)
Creatinine, Ser: 2.16 mg/dL — ABNORMAL HIGH (ref 0.61–1.24)
GFR, Estimated: 28 mL/min — ABNORMAL LOW (ref >60)
Glucose, Bld: 70 mg/dL (ref 70–99)
Potassium: 4.6 mmol/L (ref 3.5–5.1)
Sodium: 138 mmol/L (ref 135–145)
Total Bilirubin: 0.9 mg/dL (ref 0.0–1.2)
Total Protein: 6.8 g/dL (ref 6.5–8.1)

## 2024-03-13 LAB — CBC
HCT: 42.7 % (ref 39.0–52.0)
Hemoglobin: 13.3 g/dL (ref 13.0–17.0)
MCH: 29.7 pg (ref 26.0–34.0)
MCHC: 31.1 g/dL (ref 30.0–36.0)
MCV: 95.3 fL (ref 80.0–100.0)
Platelets: 161 K/uL (ref 150–400)
RBC: 4.48 MIL/uL (ref 4.22–5.81)
RDW: 14.5 % (ref 11.5–15.5)
WBC: 8.9 K/uL (ref 4.0–10.5)
nRBC: 0 % (ref 0.0–0.2)

## 2024-03-13 LAB — DIFFERENTIAL
Abs Immature Granulocytes: 0.02 K/uL (ref 0.00–0.07)
Basophils Absolute: 0 K/uL (ref 0.0–0.1)
Basophils Relative: 0 %
Eosinophils Absolute: 0.1 K/uL (ref 0.0–0.5)
Eosinophils Relative: 1 %
Immature Granulocytes: 0 %
Lymphocytes Relative: 18 %
Lymphs Abs: 1.6 K/uL (ref 0.7–4.0)
Monocytes Absolute: 0.9 K/uL (ref 0.1–1.0)
Monocytes Relative: 10 %
Neutro Abs: 6.3 K/uL (ref 1.7–7.7)
Neutrophils Relative %: 71 %

## 2024-03-13 LAB — LIPID PANEL
Cholesterol: 126 mg/dL (ref 0–200)
HDL: 41 mg/dL (ref >40)
LDL Cholesterol: 69 mg/dL (ref 0–99)
Total CHOL/HDL Ratio: 3.1 ratio
Triglycerides: 78 mg/dL (ref <150)
VLDL: 16 mg/dL (ref 0–40)

## 2024-03-13 LAB — CBG MONITORING, ED: Glucose-Capillary: 71 mg/dL (ref 70–99)

## 2024-03-13 LAB — HEMOGLOBIN A1C
Hgb A1c MFr Bld: 5.2 % (ref 4.8–5.6)
Mean Plasma Glucose: 102.54 mg/dL

## 2024-03-13 LAB — ETHANOL: Alcohol, Ethyl (B): <15 mg/dL (ref <15)

## 2024-03-13 MED ORDER — CLOPIDOGREL BISULFATE 75 MG PO TABS
75.0000 mg | ORAL_TABLET | Freq: Every day | ORAL | Status: DC
  Filled 2024-03-13: qty 1

## 2024-03-13 MED ORDER — PANTOPRAZOLE SODIUM 20 MG PO TBEC
20.0000 mg | DELAYED_RELEASE_TABLET | Freq: Every day | ORAL | Status: DC
  Administered 2024-03-14 – 2024-03-16 (×3): 20 mg via ORAL
  Filled 2024-03-13 (×3): qty 1

## 2024-03-13 MED ORDER — MELATONIN 3 MG PO TABS: 6.0000 mg | ORAL_TABLET | Freq: Every evening | ORAL | Status: DC | PRN

## 2024-03-13 MED ORDER — INSULIN ASPART 100 UNIT/ML IJ SOLN
0.0000 [IU] | Freq: Three times a day (TID) | INTRAMUSCULAR | Status: DC
  Administered 2024-03-14 – 2024-03-15 (×4): 1 [IU] via SUBCUTANEOUS

## 2024-03-13 MED ORDER — ONDANSETRON HCL 4 MG/2ML IJ SOLN: 4.0000 mg | Freq: Four times a day (QID) | INTRAMUSCULAR | Status: DC | PRN

## 2024-03-13 MED ORDER — SODIUM CHLORIDE 0.9% FLUSH
3.0000 mL | Freq: Two times a day (BID) | INTRAVENOUS | Status: DC
  Administered 2024-03-14 – 2024-03-16 (×5): 3 mL via INTRAVENOUS

## 2024-03-13 MED ORDER — INSULIN ASPART PROT & ASPART (70-30 MIX) 100 UNIT/ML ~~LOC~~ SUSP
30.0000 [IU] | Freq: Every day | SUBCUTANEOUS | Status: DC
  Administered 2024-03-15 – 2024-03-16 (×2): 30 [IU] via SUBCUTANEOUS
  Filled 2024-03-13: qty 10

## 2024-03-13 MED ORDER — ASPIRIN 81 MG PO TBEC
81.0000 mg | DELAYED_RELEASE_TABLET | Freq: Every day | ORAL | Status: DC
  Administered 2024-03-14 – 2024-03-16 (×3): 81 mg via ORAL
  Filled 2024-03-13 (×3): qty 1

## 2024-03-13 MED ORDER — ALBUTEROL SULFATE (2.5 MG/3ML) 0.083% IN NEBU: 2.5000 mg | INHALATION_SOLUTION | RESPIRATORY_TRACT | Status: DC | PRN

## 2024-03-13 MED ORDER — LEVOTHYROXINE SODIUM 25 MCG PO TABS
25.0000 ug | ORAL_TABLET | Freq: Every morning | ORAL | Status: DC
  Administered 2024-03-14 – 2024-03-16 (×3): 25 ug via ORAL
  Filled 2024-03-13 (×3): qty 1

## 2024-03-13 MED ORDER — POLYETHYLENE GLYCOL 3350 17 G PO PACK: 17.0000 g | PACK | Freq: Every day | ORAL | Status: DC | PRN

## 2024-03-13 MED ORDER — STROKE: EARLY STAGES OF RECOVERY BOOK
Freq: Once | Status: AC
  Filled 2024-03-13: qty 1

## 2024-03-13 MED ORDER — ACETAMINOPHEN 500 MG PO TABS: 1000.0000 mg | ORAL_TABLET | Freq: Four times a day (QID) | ORAL | Status: DC | PRN

## 2024-03-13 MED ORDER — ROSUVASTATIN CALCIUM 20 MG PO TABS
40.0000 mg | ORAL_TABLET | Freq: Every day | ORAL | Status: DC
  Administered 2024-03-14 – 2024-03-16 (×3): 40 mg via ORAL
  Filled 2024-03-13 (×3): qty 2

## 2024-03-13 MED ORDER — HEPARIN SODIUM (PORCINE) 5000 UNIT/ML IJ SOLN
5000.0000 [IU] | Freq: Three times a day (TID) | INTRAMUSCULAR | Status: DC
  Administered 2024-03-14 – 2024-03-16 (×7): 5000 [IU] via SUBCUTANEOUS
  Filled 2024-03-13 (×7): qty 1

## 2024-03-13 MED ORDER — MEMANTINE HCL 10 MG PO TABS
10.0000 mg | ORAL_TABLET | Freq: Two times a day (BID) | ORAL | Status: DC
  Administered 2024-03-13 – 2024-03-16 (×6): 10 mg via ORAL
  Filled 2024-03-13 (×6): qty 1

## 2024-03-13 NOTE — ED Notes (Signed)
 Neurology in triage assessing pt.

## 2024-03-13 NOTE — ED Provider Triage Note (Signed)
 Emergency Medicine Provider Triage Evaluation Note  [name] , a 89 y.o. male  was evaluated in triage.  Pt complains of left arm numbness and weakness.  Patient states he took a nap yesterday when he woke up around 3 PM, he noticed his left arm was numb and tingly.  He does not report any pain.  He does have a palpable radial pulse.  Patient has 0 out of 5 grip strength on the left, 5 out of 5 on the right.  The remainder of his neuroexam was normal.  No speech slurring.  Cranial nerves III through XII grossly intact aside from what was mentioned above.  Positive pronator drift on the left.  Patient does have a prior history of stroke.  Review of Systems  Positive: Left arm weakness, tingling Negative: Pain  Physical Exam  BP (!) 154/63 (BP Location: Left Arm)   Pulse 64   Temp 97.8 F (36.6 C)   Resp 18   Ht 5' 6 (1.676 m)   Wt 115.7 kg   SpO2 98%   BMI 41.16 kg/m  Gen:   Awake, no distress  Resp:  Normal effort.  Patient on 3 L of O2. MSK:   Moves right arm without difficulty.  Left arm with 0 out of 5 strength.   Other:  Positive pronator drift on the left.  Medical Decision Making  Medically screening exam initiated at 5:49 PM.  Appropriate orders placed.  Crispin E Eskew was informed that the remainder of the evaluation will be completed by another provider, this initial triage assessment does not replace that evaluation, and the importance of remaining in the ED until their evaluation is complete.  Patient is out of 24-hour stroke window.  ED stroke order set ordered.  Consult to neurology placed.   Torrence Marry RAMAN, PA-C 03/13/24 1801

## 2024-03-13 NOTE — ED Provider Notes (Incomplete)
 [place] EMERGENCY DEPARTMENT AT [facility] Provider Note   CSN: [id number] Arrival date & time: 03/13/24  1655     Patient presents with: Lt Hand Numbness   [name] is a 89 y.o. male   HPI     Prior to Admission medications   Medication Sig Start Date End Date Taking? Authorizing Provider  acetaminophen  (TYLENOL ) 500 MG tablet Take 500 mg by mouth in the morning and at bedtime.   Yes [provider]  Cholecalciferol 50 MCG (2000 UT) CAPS Take 2,000 Units by mouth daily.   Yes [provider]  clopidogrel  (PLAVIX ) 75 MG tablet Take 1 tablet (75 mg total) by mouth daily. Restart 10/15 07/10/23  Yes Singh, Prashant K, MD  Coenzyme Q10 (COQ10) 150 MG CAPS Take 150 mg by mouth every evening.   Yes [provider]  Continuous Glucose Sensor (FREESTYLE LIBRE 3 SENSOR) MISC Apply topically.   Yes [provider]  docusate sodium  (COLACE) 100 MG capsule Take 100 mg by mouth 2 (two) times daily.   Yes [provider]  Iron , Ferrous Sulfate , 325 (65 Fe) MG TABS Take 325 mg by mouth daily. 03/04/23  Yes Jadine Toribio SQUIBB, MD  levothyroxine  (SYNTHROID , LEVOTHROID) 25 MCG tablet Take 25 mcg by mouth every morning. 02/09/14  Yes [provider]  memantine  (NAMENDA ) 10 MG tablet Take 1 tablet (10 mg total) by mouth 2 (two) times daily. 03/03/24  Yes Wertman, Sara E, PA-C  metoprolol  succinate (TOPROL -XL) 25 MG 24 hr tablet Take 1 tablet (25 mg total) by mouth daily. 03/11/24  Yes Croitoru, Mihai, MD  Multiple Vitamin (RENAL MULTIVITAMIN/ZINC  PO) Take 1 tablet by mouth daily.   Yes [provider]  NOVOLIN 70/30 KWIKPEN (70-30) 100 UNIT/ML KwikPen Inject into the skin 2 (two) times daily. Takes 50 units in the morning and 13 units at night. Patient taking differently: Inject 8-40 Units into the skin See admin instructions. Inject 40 units into the skin in the morning and 8 units at night 04/25/22  Yes [provider]  OVER THE COUNTER MEDICATION Take 2 tablets by mouth in the morning. Emma OTC medication   Yes [provider]  pantoprazole  (PROTONIX ) 40 MG tablet Take 1 tablet (40 mg total) by mouth 2 (two) times daily. Patient taking differently: Take 20 mg by mouth daily. 07/09/23  Yes Singh, Prashant K, MD  polyethylene glycol (MIRALAX  / GLYCOLAX ) 17 g packet Take 17 g by mouth daily. 08/25/21  Yes Armenta Canning, MD  rosuvastatin  (CRESTOR ) 40 MG tablet Take 1 tablet by mouth once daily 09/24/23  Yes Croitoru, Mihai, MD  bisacodyl  (DULCOLAX) 5 MG EC tablet Take 5 mg by mouth daily as needed for severe constipation. Patient not taking: Reported on 03/13/2024    [provider]  furosemide  (LASIX ) 20 MG tablet Take 20 mg by mouth daily as needed. Patient not taking: Reported on 03/13/2024    [provider]  nitroGLYCERIN  (NITROSTAT ) 0.4 MG SL tablet DISSOLVE ONE TABLET UNDER THE TONGUE EVERY 5 MINUTES AS NEEDED FOR CHEST PAIN. Patient not taking: Reported on 03/03/2024 03/13/22   Croitoru, Jerel, MD    Allergies: Patient has no known allergies.    Review of Systems  Updated Vital Signs BP (!) 146/62   Pulse 64   Temp 98.5 F (36.9 C) (Oral)   Resp 16   Ht 5' 6 (1.676 m)   Wt 115.7 kg   SpO2 100%   BMI 41.16  kg/m   Physical Exam Vitals and nursing note reviewed.  Constitutional:      General: He is not in acute distress.    Appearance: He is not toxic-appearing.  Neurological:     Mental Status: He is alert.     (all labs ordered are listed, but only abnormal results are displayed) Labs Reviewed  COMPREHENSIVE METABOLIC PANEL WITH GFR - Abnormal; Notable for the following components:      Result Value   BUN 43 (*)    Creatinine, Ser 2.16 (*)    GFR, Estimated 28 (*)    All other components within normal limits  CBC  DIFFERENTIAL  ETHANOL  HEMOGLOBIN A1C  LIPID PANEL  RAPID URINE DRUG SCREEN, HOSP PERFORMED  PROTIME-INR  BASIC METABOLIC  PANEL WITH GFR  MAGNESIUM   PHOSPHORUS  CBC  CBG MONITORING, ED    EKG: None  Radiology: CT Cervical Spine Wo Contrast Result Date: 03/13/2024 EXAM: CT CERVICAL SPINE WITHOUT CONTRAST 03/13/2024 08:16:16 PM TECHNIQUE: CT of the cervical spine was performed without the administration of intravenous contrast. Multiplanar reformatted images are provided for review. Automated exposure control, iterative reconstruction, and/or weight based adjustment of the mA/kV was utilized to reduce the radiation dose to as low as reasonably achievable. COMPARISON: Comparison with 03/04/2022. CLINICAL HISTORY: Arm weakness. Chief complaints: Left hand numbness. Neuro deficit, acute, stroke suspected. FINDINGS: CERVICAL SPINE: BONES AND ALIGNMENT: No acute fracture or traumatic malalignment. 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 No severe spinal canal or neural foramina narrowing. SOFT TISSUES: No prevertebral soft tissue swelling. IMPRESSION: 1. No acute abnormality of the cervical spine. 2. 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 Electronically signed by: Norman Gatlin MD 03/13/2024 08:24 PM EDT RP Workstation: [id number]   CT HEAD WO CONTRAST Result Date: 03/13/2024 EXAM: CT HEAD WITHOUT CONTRAST 03/13/2024 08:16:16 PM TECHNIQUE: CT of the head was performed without the administration of intravenous contrast. Automated exposure control, iterative reconstruction, and/or weight based adjustment of the mA/kV was utilized to reduce the radiation dose to as low as reasonably achievable. COMPARISON: Comparison with 05/21/2022. CLINICAL HISTORY: Neuro deficit, acute, stroke suspected. Chief complaints; Lt Hand Numbness; CT HEAD WO CONTRAST; Neuro deficit, acute, stroke suspected; CT Cervical Spine Wo Contrast; arm weakness. FINDINGS: BRAIN AND VENTRICLES: No acute hemorrhage. No evidence of acute  infarct. No hydrocephalus. No extra-axial collection. No mass effect or midline shift. Chronic microvascular ischemia and generalized atrophy. ORBITS: No acute abnormality. SINUSES: Changes of Chronic right maxillary sinusitis. SOFT TISSUES AND SKULL: No acute soft tissue abnormality. No skull fracture. IMPRESSION: 1. No acute intracranial abnormality. Electronically signed by: Norman Gatlin MD 03/13/2024 08:21 PM EDT RP Workstation: [id number]   Procedures   Medications Ordered in the ED  heparin  injection 5,000 Units (has no administration in time range)  sodium chloride  flush (NS) 0.9 % injection 3 mL (has no administration in time range)  acetaminophen  (TYLENOL ) tablet 1,000 mg (has no administration in time range)  albuterol  (PROVENTIL ) (2.5 MG/3ML) 0.083% nebulizer solution 2.5 mg (has no administration in time range)  melatonin tablet 6 mg (has no administration in time range)  ondansetron  (ZOFRAN ) injection 4 mg (has no administration in time range)  polyethylene glycol (MIRALAX  / GLYCOLAX ) packet 17 g (has no administration in time range)   stroke: early stages of recovery book (has no administration in time range)  rosuvastatin  (CRESTOR ) tablet 40 mg (has no administration in time range)  memantine  (NAMENDA )  tablet 10 mg (10 mg Oral Given 03/13/24 2326)  levothyroxine  (SYNTHROID ) tablet 25 mcg (has no administration in time range)  insulin  aspart protamine- aspart (NOVOLOG  MIX 70/30) injection 30 Units (has no administration in time range)  pantoprazole  (PROTONIX ) EC tablet 20 mg (has no administration in time range)  clopidogrel  (PLAVIX ) tablet 75 mg (has no administration in time range)  aspirin  EC tablet 81 mg (has no administration in time range)  insulin  aspart (novoLOG ) injection 0-6 Units (has no administration in time range)   Medical Decision Making Risk Decision regarding hospitalization.   89 y.o. male presents to the ER for evaluation of left hand weakness.  Differential diagnosis includes but is not limited to stroke, pinched nerve, central cord syndrome. Vital signs elevated blood pressure otherwise unremarkable. Physical exam as noted above.   Patient is out of any stroke window given symptoms started greater than 24 hours ago.  I did consult neurology and spoke with Dr. Alphonsa who came to see the patient at bedside. Potential MRI given if the patient's pacemaker leads are MRI compatible and admission for stroke work up. Please see his note for the entirety of the recommendations.  CTA will be beneficial however given patient's kidney function he is not a candidate.  CT head and cervical spine to be ordered.  I independently reviewed and interpreted the patient's labs. CBC without leukocytosis or anemia.  CMP shows a creatinine of 2.16 and BUN of 43.  GFR of 28.  No other electrolyte or LFT abnormality.  And actually appears improved from baseline.  CBC within normal limits.  Ethanol undetectable.  Other labs pending.  Patient to be admitted. Dr. Maretta to admit.  Portions of this report may have been transcribed using voice recognition software. Every effort was made to ensure accuracy; however, inadvertent computerized transcription errors may be present.    Final diagnoses:  Left hand weakness  Stroke-like symptoms    ED Discharge Orders     None

## 2024-03-13 NOTE — H&P (Signed)
 History and Physical    [name] FMW:[id number] DOB: November 17, 1931 DOA: 03/13/2024  PCP: Espinoza, Alejandra, DO   Patient coming from: Home   Chief Complaint:  Chief Complaint  Patient presents with   Lt Hand Numbness    HPI:  [name] is a 89 y.o. male with hx of prior CVA, vascular dementia, CAD with PCI, carotid artery disease, systolic/diastolic HF, SSS s/p dual chamber PPM (St Jude [employer]; per Cards generator is MRI conditional, his leads (SJM atrial 1022T, ventricular 1216T) are not MRI compatible), CKD4, HTN, HLD, DM2, hypothyroidism, OSA, Obesity, DJD, mood d/o, who presented with left hand weakness and hypoesthesia. Reports going to take a nap at 3PM, and thinks he woke ~ 20 minutes later; although grandson thought it was more like an hour after. Upon waking noted that he was having difficulty keeping his left hand on his rollator because he could not feel the handle, and his left hand felt weak and clumsy. Did not have more proximal weakness/sensory change in the L arm, or any in the leg. No headache, vision change, speech change, or other numbness / weakness. Notes he does have chronic R shoulder issues. He thought the symptoms in his L hand were from sleeping on his left side but presented due to the persistence of his symptoms. Denies other recent illness. He is taking DAPT prior to admission.    Review of Systems:  ROS complete and negative except as marked above   No Known Allergies  Prior to Admission medications   Medication Sig Start Date End Date Taking? Authorizing Provider  acetaminophen  (TYLENOL ) 500 MG tablet Take 500 mg by mouth 2 (two) times daily as needed for moderate pain.    [provider]  albuterol  (VENTOLIN  HFA) 108 (90 Base) MCG/ACT inhaler Inhale 2 puffs into the lungs every 4 (four) hours as needed for wheezing or shortness of breath. 05/25/23   [provider]  bisacodyl  (DULCOLAX) 5 MG EC tablet Take 5 mg by mouth daily as  needed for severe constipation.    [provider]  calcitRIOL  (ROCALTROL ) 0.25 MCG capsule Take 0.25 mcg by mouth 2 (two) times a week. 02/05/23   [provider]  clopidogrel  (PLAVIX ) 75 MG tablet Take 1 tablet (75 mg total) by mouth daily. Restart 10/15 07/10/23   Singh, Prashant K, MD  Coenzyme Q10 (COQ10) 150 MG CAPS Take 150 mg by mouth every evening.    [provider]  Continuous Glucose Sensor (FREESTYLE LIBRE 3 SENSOR) MISC Apply topically.    [provider]  docusate sodium  (COLACE) 100 MG capsule Take 100 mg by mouth daily.    [provider]  Iron , Ferrous Sulfate , 325 (65 Fe) MG TABS Take 325 mg by mouth daily. 03/04/23   Jadine Toribio SQUIBB, MD  levothyroxine  (SYNTHROID , LEVOTHROID) 25 MCG tablet Take 25 mcg by mouth every morning. 02/09/14   [provider]  memantine  (NAMENDA ) 10 MG tablet Take 1 tablet (10 mg total) by mouth 2 (two) times daily. 03/03/24   Wertman, Sara E, PA-C  metoprolol  succinate (TOPROL -XL) 25 MG 24 hr tablet Take 1 tablet (25 mg total) by mouth daily. 03/11/24   Croitoru, Mihai, MD  Multiple Vitamin (RENAL MULTIVITAMIN/ZINC  PO) Take 1 tablet by mouth daily.    [provider]  nitroGLYCERIN  (NITROSTAT ) 0.4 MG SL tablet DISSOLVE ONE TABLET UNDER THE TONGUE EVERY 5 MINUTES AS NEEDED FOR CHEST PAIN. Patient not taking: Reported on 03/03/2024 03/13/22   Croitoru,  Mihai, MD  NOVOLIN 70/30 KWIKPEN (70-30) 100 UNIT/ML KwikPen Inject into the skin 2 (two) times daily. Takes 50 units in the morning and 13 units at night. Patient taking differently: Inject into the skin 2 (two) times daily. Takes 50 units in the morning and 13 units at night.now taken 10/13 40 units and 8 04/25/22   [provider]  OVER THE COUNTER MEDICATION Take 2 tablets by mouth in the morning. Emma OTC medication    [provider]  pantoprazole  (PROTONIX ) 40 MG tablet Take 1 tablet (40 mg total) by mouth 2 (two) times  daily. 07/09/23   Singh, Prashant K, MD  polyethylene glycol (MIRALAX  / GLYCOLAX ) 17 g packet Take 17 g by mouth daily. 08/25/21   Armenta Canning, MD  rosuvastatin  (CRESTOR ) 40 MG tablet Take 1 tablet by mouth once daily 09/24/23   Croitoru, Mihai, MD    Past Medical History:  Diagnosis Date   Abnormal Doppler ultrasound of carotid artery 12/16/2012   mildly abnormal doppler . no prior studies to compare to. follow up studies are recommended when clinically indicated.   Acute respiratory failure with hypoxia 11/2018   Blepharitis    CAD (coronary artery disease) 07/24/2013   Previously placed drug-eluting stents in the LAD and RCA in 2005, low risk perfusion study December 2011 in January 2014   Cardiomyopathy, ischemic 07/24/2013   EF 45-50% echo March 2014   Carotid stenosis, left 11/2018   on left 60%   Cerebrovascular disease 03/24/2022   CHB (complete heart block) 08/27/2019   Chronic combined systolic and diastolic heart failure 02/19/2015   CKD (chronic kidney disease) stage 3, GFR 30-59 ml/min 07/24/2013   Complication of anesthesia    Cardiac event during colonoscopy. States they had to restart his pacemaker.   Diabetic hypoglycemia 05/02/2021   Diabetic polyneuropathy 05/02/2021   Gastric ulcer    GI bleed    History of cancer    History of echocardiogram 08/15/2012   EF  45-50 % very difficult study, severe LVH, pacemaker, trace TR,mildly enlarged RV,; aorta mildly calcified   History of stress test 06/06/2012   low risk scan. fixed inferior defect consistant with prior infarction with moderatly reduced EF. no appreciable evidence of ischemia. no significant change from previous study   Hypothyroidism 11/24/2018   Long term (current) use of insulin  05/02/2021   Mild vascular dementia 03/23/2022   Mixed hyperlipidemia 07/24/2013   Morbid obesity 02/18/2016   OSA (obstructive sleep apnea) 07/24/2013   no CPAP use   Pacemaker 2011   St[employer] Jude [employer] DDDR ; this is the third  generator this pt. has had   Pain due to onychomycosis of toenails of both feet 11/13/2018   Pain in joint of right shoulder 09/15/2021   Second degree AV block 03/09/2014   Sensorineural hearing loss (SNHL) of both ears 04/08/2018   Shingles    Sinus node dysfunction 03/09/2014   TIA (transient ischemic attack) 11/2018   vs stroke not seen on CT   Tinnitus, bilateral 04/08/2018   Type II diabetes mellitus     Past Surgical History:  Procedure Laterality Date   BIOPSY  03/02/2023   Procedure: BIOPSY;  Surgeon: Rosalie Kitchens, MD;  Location: [facility];  Service: Gastroenterology;;   BIOPSY  07/09/2023   Procedure: BIOPSY;  Surgeon: Saintclair Jasper, MD;  Location: [facility];  Service: Gastroenterology;;   CARDIAC CATHETERIZATION  12-29-2003   rt. coronary angiography was done with a 6 french 4 cm taper  coronary cath. This demonstrated  approximately 70 to 75% mildly segmental stenosis in the mid RCA before the acute margin           CARDIAC CATHETERIZATION  11-19-2003   diagnostic cath,, pt. to get second cath to have stent placed   cardiac stents     CATARACT EXTRACTION     ESOPHAGOGASTRODUODENOSCOPY N/A 07/09/2023   Procedure: ESOPHAGOGASTRODUODENOSCOPY (EGD);  Surgeon: Saintclair Jasper, MD;  Location: [facility]COPY;  Service: Gastroenterology;  Laterality: N/A;   ESOPHAGOGASTRODUODENOSCOPY (EGD) WITH PROPOFOL  N/A 03/02/2023   Procedure: ESOPHAGOGASTRODUODENOSCOPY (EGD) WITH PROPOFOL ;  Surgeon: Rosalie Kitchens, MD;  Location: [facility];  Service: Gastroenterology;  Laterality: N/A;   KNEE SURGERY Right    PPM GENERATOR CHANGEOUT N/A 04/07/2019   Procedure: PPM GENERATOR CHANGEOUT;  Surgeon: Francyne Headland, MD;  Location: [facility];  Service: Cardiovascular;  Laterality: N/A;   PROSTATE BIOPSY     prostate seed implants     SPINE SURGERY     WISDOM TOOTH EXTRACTION       reports that he has never smoked. He has quit using smokeless tobacco. He reports current alcohol  use. He reports  that he does not use drugs.  Family History  Problem Relation Age of Onset   Memory loss Mother        with very advanced age   Heart disease Father      Physical Exam: Vitals:   03/13/24 1701 03/13/24 1712 03/13/24 2043  BP: (!) 154/63  134/68  Pulse: 64  68  Resp: 18  19  Temp: 97.8 F (36.6 C)  98.5 F (36.9 C)  TempSrc:   Oral  SpO2: 98%  99%  Weight:  115.7 kg   Height:  5' 6 (1.676 m)     Gen: Awake, alert, NAD   CV: Regular, normal S1, S2, no murmurs  Resp: Normal WOB, CTAB  Abd: Round, normoactive, nontender MSK: Symmetric, no edema  Skin: No rashes or lesions to exposed skin  Neuro: Alert and interactive, CN 2-12 intact, motor is 5/5 with exception of the L hand which has weak grip and Abduction. There is hypoesthesia in the left hand. Otherwise 5/5 on the R , and BLE, sensation otherwise intact to fine touch.  Psych: euthymic, appropriate    Data review:   Labs reviewed, notable for:   Cr 2.1 near b/l  LDL is 69  A1c 5.2%    Micro:  Results for orders placed or performed during the hospital encounter of 07/03/23  Resp panel by RT-PCR (RSV, Flu A&B, Covid) Anterior Nasal Swab     Status: None   Collection Time: 07/03/23  9:26 AM   Specimen: Anterior Nasal Swab  Result Value Ref Range Status   SARS Coronavirus 2 by RT PCR NEGATIVE NEGATIVE Final   Influenza A by PCR NEGATIVE NEGATIVE Final   Influenza B by PCR NEGATIVE NEGATIVE Final    Comment: (NOTE) The Xpert Xpress SARS-CoV-2/FLU/RSV plus assay is intended as an aid in the diagnosis of influenza from Nasopharyngeal swab specimens and should not be used as a sole basis for treatment. Nasal washings and aspirates are unacceptable for Xpert Xpress SARS-CoV-2/FLU/RSV testing.  Fact Sheet for Patients: BloggerCourse.com  Fact Sheet for Healthcare Providers: SeriousBroker.it  This test is not yet approved or cleared by the [place]  FDA  and has been authorized for detection and/or diagnosis of SARS-CoV-2 by FDA under an Emergency Use Authorization (EUA). This EUA will remain in effect (meaning this test  can be used) for the duration of the COVID-19 declaration under Section 564(b)(1) of the Act, 21 U.S.C. section 360bbb-3(b)(1), unless the authorization is terminated or revoked.     Resp Syncytial Virus by PCR NEGATIVE NEGATIVE Final    Comment: (NOTE) Fact Sheet for Patients: BloggerCourse.com  Fact Sheet for Healthcare Providers: SeriousBroker.it  This test is not yet approved or cleared by the [place]  FDA and has been authorized for detection and/or diagnosis of SARS-CoV-2 by FDA under an Emergency Use Authorization (EUA). This EUA will remain in effect (meaning this test can be used) for the duration of the COVID-19 declaration under Section 564(b)(1) of the Act, 21 U.S.C. section 360bbb-3(b)(1), unless the authorization is terminated or revoked.  Performed at [facility], [address], [place], [place] [address]     Imaging reviewed:  CT Cervical Spine Wo Contrast Result Date: 03/13/2024 EXAM: CT CERVICAL SPINE WITHOUT CONTRAST 03/13/2024 08:16:16 PM TECHNIQUE: CT of the cervical spine was performed without the administration of intravenous contrast. Multiplanar reformatted images are provided for review. Automated exposure control, iterative reconstruction, and/or weight based adjustment of the mA/kV was utilized to reduce the radiation dose to as low as reasonably achievable. COMPARISON: Comparison with 03/04/2022. CLINICAL HISTORY: Arm weakness. Chief complaints: Left hand numbness. Neuro deficit, acute, stroke suspected. FINDINGS: CERVICAL SPINE: BONES AND ALIGNMENT: No acute fracture or traumatic malalignment. 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 No severe spinal canal or neural foramina  narrowing. SOFT TISSUES: No prevertebral soft tissue swelling. IMPRESSION: 1. No acute abnormality of the cervical spine. 2. 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 Electronically signed by: Norman Gatlin MD 03/13/2024 08:24 PM EDT RP Workstation: [id number]   CT HEAD WO CONTRAST Result Date: 03/13/2024 EXAM: CT HEAD WITHOUT CONTRAST 03/13/2024 08:16:16 PM TECHNIQUE: CT of the head was performed without the administration of intravenous contrast. Automated exposure control, iterative reconstruction, and/or weight based adjustment of the mA/kV was utilized to reduce the radiation dose to as low as reasonably achievable. COMPARISON: Comparison with 05/21/2022. CLINICAL HISTORY: Neuro deficit, acute, stroke suspected. Chief complaints; Lt Hand Numbness; CT HEAD WO CONTRAST; Neuro deficit, acute, stroke suspected; CT Cervical Spine Wo Contrast; arm weakness. FINDINGS: BRAIN AND VENTRICLES: No acute hemorrhage. No evidence of acute infarct. No hydrocephalus. No extra-axial collection. No mass effect or midline shift. Chronic microvascular ischemia and generalized atrophy. ORBITS: No acute abnormality. SINUSES: Changes of Chronic right maxillary sinusitis. SOFT TISSUES AND SKULL: No acute soft tissue abnormality. No skull fracture. IMPRESSION: 1. No acute intracranial abnormality. Electronically signed by: Norman Gatlin MD 03/13/2024 08:21 PM EDT RP Workstation: [id number]    EKG:  Personally reviewed,  V paced   ED Course:   Neurology consulted, rec for stroke workup    Assessment/Plan:  89 y.o. male with hx prior CVA, vascular dementia, CAD with PCI, carotid artery disease, systolic/diastolic HF, SSS s/p dual chamber PPM (St Jude Assurity; per Cards generator is MRI conditional, his leads (SJM atrial 1022T, ventricular 1216T) are not MRI compatible), CKD4, HTN, HLD, DM2, hypothyroidism, OSA, Obesity, DJD, mood d/o, who  presented with left hand weakness and hypoesthesia  Stroke like symptoms, L hand weakness / hypoesthesia  LKWT 10/23 at 1500 before nap. Awoke with L hand symptoms per above. Exam with weakness and hypoesthesia in the L hand. CT head and C spine, negative for acute abnormality. Suspect stroke based on distribution of deficit. Not TNK candidate as  outside window  - Neurology consulted, appreciate recommendations - Per cardiology records, although PPM is MRI conditional, leads are not MRI compatible.; Will plan for an interval head CT at 24 hr to eval for changes of stroke.  -- Carotid US  for vessel imaging due to his CKD and MRI incompatible leads.  -- Antiplatelet with continued DAPT (on prior to admission)   - Statin continue rosuvastatin  40 mg; LDL is 69   - Permissive HTN <220/120; hold home Metoprolol , lasix  for now  - Serial neurochecks  - Telemonitoring, PPM interrogation, RN to call St Jude in AM  - TTE with bubble - PT/OT/SLP - DVT prophylaxis per below - Swallow screen then HH/CC diet    Chronic medical problems  Hx CVA: See acute management above  Vascular dementia: Continue home Memantine   CAD with PCI: See CVA mgmt above  carotid artery disease: Will be assessed on Carotid US   systolic/diastolic HF: Will be resassesed on echo. No acute exacerbation. Holding lasix , metoprolol  for now for permissive HTN  SSS s/p dual chamber PPM: St Jude Assurity; per Cards generator is MRI conditional, his leads (SJM atrial 1022T, ventricular 1216T) are not MRI compatible CKD4: Current Cr at recent low, 2.16  HTN: Holding metoprolol  and lasix   HLD: Continue statin per above  DM2: hold his evening dose of 70/30; resume tomorrow AM, modest reduction to 30 units AM and can reeval if PM dose needed while inpatient. Home regimen is 40 U AM, 8 U PM per his report  Hypothyroidism: Continue home levothyroxine   OSA: Not on CPAP  Obesity, class III: Would benefit from weight loss OP  GERD: continue  home PPI  DJD: Noted  Mood d/o: Not on medication   Body mass index is 41.16 kg/m.   DVT prophylaxis:  SQ Heparin  Code Status:  Full Code; was unsure about this. Prefers full code for now  Diet:  Diet Orders (From admission, onward)     Start     Ordered   03/13/24 1758  Diet NPO time specified  (Not Code Stroke (No thrombolytic / No IR, Stroke suspected.))  Diet effective now       Comments: NPO until swallow screen is complete   03/13/24 1801           Family Communication:  Yes spoke with grandson at bedside   Consults:  Neurology   Admission status:   Inpatient, Telemetry bed  Severity of Illness: The appropriate patient status for this patient is INPATIENT. Inpatient status is judged to be reasonable and necessary in order to provide the required intensity of service to ensure the patient's safety. The patient's presenting symptoms, physical exam findings, and initial radiographic and laboratory data in the context of their chronic comorbidities is felt to place them at high risk for further clinical deterioration. Furthermore, it is not anticipated that the patient will be medically stable for discharge from the hospital within 2 midnights of admission.   * I certify that at the point of admission it is my clinical judgment that the patient will require inpatient hospital care spanning beyond 2 midnights from the point of admission due to high intensity of service, high risk for further deterioration and high frequency of surveillance required.*   Dorn Dawson, MD [facility]  How to contact the [facility] Attending or Consulting provider 7A - 7P or covering provider during after hours 7P -7A, for this patient.  Check the care team in [facility] and look for a) attending/consulting [facility] provider  listed and b) the [facility] team listed Log into [email or web address] and use [place]'s universal password to access. If you do not have the password, please contact the hospital  operator. Locate the [facility] provider you are looking for under [facility] and page to a number that you can be directly reached. If you still have difficulty reaching the provider, please page the [facility] (Director on Call) for the Hospitalists listed on amion for assistance.  03/13/2024, 8:58 PM

## 2024-03-13 NOTE — Consult Note (Signed)
 NEUROLOGY CONSULT NOTE   Date of service: March 13, 2024 Patient Name: [name] MRN:  [id number] DOB:  16-Jul-1931 Chief Complaint: L arm weakness Requesting Provider: Ginger Lonni PARAS, *  History of Present Illness  [name] is a 89 y.o. 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  Patient reports that he got out of the bathroom at 2 PM yesterday.  He took a 20-minute nap.  He feels like he slept on the left arm.  When he tried to turn in the bed, he noted that his left arm was weak.  He came into the ED today as it was not getting better.  He thought that he just slept on it and that is why it was weak and felt that it would get better on its own.  He endorses prior history of TIA.  No family history of strokes.  He endorses history of diabetes but reports his most recent A1c was less than 6.  He endorses history of hypertension and hyperlipidemia.  He does not smoke, does not use any recreational substances.   LKW: 1400 on 03/12/2024. Modified rankin score: 1-No significant post stroke disability and can perform usual duties with stroke symptoms IV Thrombolysis: Not offered, outside of window.   EVT: Not offered, no LVO signs.    NIHSS components Score: Comment  1a Level of Conscious 0[x]  1[]  2[]  3[]      1b LOC Questions 0[x]  1[]  2[]       1c LOC Commands 0[x]  1[]  2[]       2 Best Gaze 0[x]  1[]  2[]       3 Visual 0[x]  1[]  2[]  3[]      4 Facial Palsy 0[x]  1[]  2[]  3[]      5a Motor Arm - left 0[x]  1[]  2[]  3[]  4[]  UN[]    5b Motor Arm - Right 0[x]  1[]  2[]  3[]  4[]  UN[]    6a Motor Leg - Left 0[x]  1[]  2[]  3[]  4[]  UN[]    6b Motor Leg - Right 0[x]  1[]  2[]  3[]  4[]  UN[]    7 Limb Ataxia 0[x]  1[]  2[]  UN[]       8 Sensory 0[]  1[x]  2[]  UN[]      9 Best Language 0[x]  1[]  2[]  3[]      10 Dysarthria 0[x]  1[]  2[]  UN[]      11 Extinct. and Inattention 0[x]  1[]  2[]       TOTAL: 1      ROS  Comprehensive ROS performed and pertinent positives documented in HPI   Past History   Past Medical History:  Diagnosis Date   Abnormal Doppler ultrasound of carotid artery 12/16/2012   mildly abnormal doppler . no prior studies to compare to. follow up studies are recommended when clinically indicated.   Acute respiratory failure with hypoxia 11/2018   Blepharitis    CAD (coronary artery disease) 07/24/2013   Previously placed drug-eluting stents in the LAD and RCA in 2005, low risk perfusion study December 2011 in January 2014   Cardiomyopathy, ischemic 07/24/2013   EF 45-50% echo March 2014   Carotid stenosis, left 11/2018   on left 60%   Cerebrovascular disease 03/24/2022   CHB (complete heart block) 08/27/2019   Chronic combined systolic and diastolic heart failure 02/19/2015   CKD (chronic kidney disease) stage 3,  GFR 30-59 ml/min 07/24/2013   Complication of anesthesia    Cardiac event during colonoscopy. States they had to restart his pacemaker.   Diabetic hypoglycemia 05/02/2021   Diabetic polyneuropathy 05/02/2021   Gastric ulcer    GI bleed    History of cancer    History of echocardiogram 08/15/2012   EF  45-50 % very difficult study, severe LVH, pacemaker, trace TR,mildly enlarged RV,; aorta mildly calcified   History of stress test 06/06/2012   low risk scan. fixed inferior defect consistant with prior infarction with moderatly reduced EF. no appreciable evidence of ischemia. no significant change from previous study   Hypothyroidism 11/24/2018   Long term (current) use of insulin  05/02/2021   Mild vascular dementia 03/23/2022   Mixed hyperlipidemia 07/24/2013   Morbid obesity 02/18/2016   OSA (obstructive sleep apnea) 07/24/2013   no CPAP use   Pacemaker 2011   [employer] DDDR ;  this is the third generator this pt. has had   Pain due to onychomycosis of toenails of both feet 11/13/2018   Pain in joint of right shoulder 09/15/2021   Second degree AV block 03/09/2014   Sensorineural hearing loss (SNHL) of both ears 04/08/2018   Shingles    Sinus node dysfunction 03/09/2014   TIA (transient ischemic attack) 11/2018   vs stroke not seen on CT   Tinnitus, bilateral 04/08/2018   Type II diabetes mellitus     Past Surgical History:  Procedure Laterality Date   BIOPSY  03/02/2023   Procedure: BIOPSY;  Surgeon: Rosalie Kitchens, MD;  Location: [facility];  Service: Gastroenterology;;   BIOPSY  07/09/2023   Procedure: BIOPSY;  Surgeon: Saintclair Jasper, MD;  Location: [facility] END[facility]COPY;  Service: Gastroenterology;;   CARDIAC CATHETERIZATION  12-29-2003   rt. coronary angiography was done with a 6 french 4 cm taper coronary cath. This demonstrated  approximately 70 to 75% mildly segmental stenosis in the mid RCA before the acute margin           CARDIAC CATHETERIZATION  11-19-2003   diagnostic cath,, pt. to get second cath to have stent placed   cardiac stents     CATARACT EXTRACTION     ESOPHAGOGASTRODUODENOSCOPY N/A 07/09/2023   Procedure: ESOPHAGOGASTRODUODENOSCOPY (EGD);  Surgeon: Saintclair Jasper, MD;  Location: [facility] ENDOSCOPY;  Service: Gastroenterology;  Laterality: N/A;   ESOPHAGOGASTRODUODENOSCOPY (EGD) WITH PROPOFOL  N/A 03/02/2023   Procedure: ESOPHAGOGASTRODUODENOSCOPY (EGD) WITH PROPOFOL ;  Surgeon: Rosalie Kitchens, MD;  Location: [facility] ENDOSCOPY;  Service: Gastroenterology;  Laterality: N/A;   KNEE SURGERY Right    PPM GENERATOR CHANGEOUT N/A 04/07/2019   Procedure: PPM GENERATOR CHANGEOUT;  Surgeon: Francyne Headland, MD;  Location: [facility] INVASIVE CV LAB;  Service: Cardiovascular;  Laterality: N/A;   PROSTATE BIOPSY     prostate seed implants     SPINE SURGERY     WISDOM TOOTH EXTRACTION      Family History: Family History  Problem Relation Age of Onset   Memory loss Mother         with very advanced age   Heart disease Father     Social History  reports that he has never smoked. He has quit using smokeless tobacco. He reports current alcohol  use. He reports that he does not use drugs.  No Known Allergies  Medications   Current Facility-Administered Medications:    betamethasone  acetate-betamethasone  sodium phosphate  (CELESTONE ) injection 3 mg, 3 mg, Intramuscular, Once, Evans, Brent M, DPM  Current Outpatient Medications:    acetaminophen  (TYLENOL ) 500  MG tablet, Take 500 mg by mouth 2 (two) times daily as needed for moderate pain., Disp: , Rfl:    albuterol  (VENTOLIN  HFA) 108 (90 Base) MCG/ACT inhaler, Inhale 2 puffs into the lungs every 4 (four) hours as needed for wheezing or shortness of breath., Disp: , Rfl:    bisacodyl  (DULCOLAX) 5 MG EC tablet, Take 5 mg by mouth daily as needed for severe constipation., Disp: , Rfl:    calcitRIOL  (ROCALTROL ) 0.25 MCG capsule, Take 0.25 mcg by mouth 2 (two) times a week., Disp: , Rfl:    clopidogrel  (PLAVIX ) 75 MG tablet, Take 1 tablet (75 mg total) by mouth daily. Restart 10/15, Disp: , Rfl:    Coenzyme Q10 (COQ10) 150 MG CAPS, Take 150 mg by mouth every evening., Disp: , Rfl:    Continuous Glucose Sensor (FREESTYLE LIBRE 3 SENSOR) MISC, Apply topically., Disp: , Rfl:    docusate sodium  (COLACE) 100 MG capsule, Take 100 mg by mouth daily., Disp: , Rfl:    Iron , Ferrous Sulfate , 325 (65 Fe) MG TABS, Take 325 mg by mouth daily., Disp: , Rfl:    levothyroxine  (SYNTHROID , LEVOTHROID) 25 MCG tablet, Take 25 mcg by mouth every morning., Disp: , Rfl:    memantine  (NAMENDA ) 10 MG tablet, Take 1 tablet (10 mg total) by mouth 2 (two) times daily., Disp: 180 tablet, Rfl: 0   metoprolol  succinate (TOPROL -XL) 25 MG 24 hr tablet, Take 1 tablet (25 mg total) by mouth daily., Disp: 30 tablet, Rfl: 0   Multiple Vitamin (RENAL MULTIVITAMIN/ZINC  PO), Take 1 tablet by mouth daily., Disp: , Rfl:    nitroGLYCERIN  (NITROSTAT ) 0.4 MG SL tablet,  DISSOLVE ONE TABLET UNDER THE TONGUE EVERY 5 MINUTES AS NEEDED FOR CHEST PAIN. (Patient not taking: Reported on 03/03/2024), Disp: 25 tablet, Rfl: 1   NOVOLIN 70/30 KWIKPEN (70-30) 100 UNIT/ML KwikPen, Inject into the skin 2 (two) times daily. Takes 50 units in the morning and 13 units at night. (Patient taking differently: Inject into the skin 2 (two) times daily. Takes 50 units in the morning and 13 units at night.now taken 10/13 40 units and 8), Disp: , Rfl:    OVER THE COUNTER MEDICATION, Take 2 tablets by mouth in the morning. Emma OTC medication, Disp: , Rfl:    pantoprazole  (PROTONIX ) 40 MG tablet, Take 1 tablet (40 mg total) by mouth 2 (two) times daily., Disp: 60 tablet, Rfl: 2   polyethylene glycol (MIRALAX  / GLYCOLAX ) 17 g packet, Take 17 g by mouth daily., Disp: 14 each, Rfl: 0   rosuvastatin  (CRESTOR ) 40 MG tablet, Take 1 tablet by mouth once daily, Disp: 90 tablet, Rfl: 1  Vitals   Vitals:   03/13/24 1701 03/13/24 1712  BP: (!) 154/63   Pulse: 64   Resp: 18   Temp: 97.8 F (36.6 C)   SpO2: 98%   Weight:  115.7 kg  Height:  5' 6 (1.676 m)    Body mass index is 41.16 kg/m.   Physical Exam   General: Laying comfortably in bed; in no acute distress.  HENT: Normal oropharynx and mucosa. Normal external appearance of ears and nose.  Neck: Supple, no pain or tenderness  CV: No JVD. No peripheral edema.  Pulmonary: Symmetric Chest rise. Normal respiratory effort.  Abdomen: Soft to touch, non-tender.  Ext: No cyanosis, edema, or deformity  Skin: No rash. Normal palpation of skin.   Musculoskeletal: Normal digits and nails by inspection. No clubbing.   Neurologic Examination  Mental status/Cognition: Alert,  oriented to self, place, month and year, good attention.  Speech/language: Fluent, comprehension intact, object naming intact, repetition intact.  Cranial nerves:   CN II Pupils equal and reactive to light, no VF deficits    CN III,IV,VI EOM intact, no gaze preference  or deviation, no nystagmus    CN V normal sensation in V1, V2, and V3 segments bilaterally    CN VII no asymmetry, no nasolabial fold flattening    CN VIII normal hearing to speech    CN IX & X normal palatal elevation, no uvular deviation    CN XI 5/5 head turn and 5/5 shoulder shrug bilaterally    CN XII midline tongue protrusion    Motor:  Muscle bulk: normal for his age, tone normal, pronator drift noted in LUE Mvmt Root Nerve  Muscle Right Left Comments  SA C5/6 Ax Deltoid 5 4   EF C5/6 Mc Biceps 5 4   EE C6/7/8 Rad Triceps 5 4   WF C6/7 Med FCR  2   WE C7/8 PIN ECU  2   F Ab C8/T1 U ADM/FDI 5 1   HF L1/2/3 Fem Illopsoas 5 5   KE L2/3/4 Fem Quad 5 5   DF L4/5 D Peron Tib Ant 5 5   PF S1/2 Tibial Grc/Sol 5 5    Sensation:  Light touch Decreased in left upper extremity to touch.   Pin prick    Temperature    Vibration   Proprioception    Coordination/Complex Motor:  - Finger to Nose intact bilaterally - Heel to shin intact bilaterally - Rapid alternating movement significantly slow over the left upper extremity. - Gait: Deferred for patient's safety. Labs/Imaging/Neurodiagnostic studies   CBC:  Recent Labs  Lab 04-06-2024 1801  WBC 8.9  NEUTROABS 6.3  HGB 13.3  HCT 42.7  MCV 95.3  PLT 161   Basic Metabolic Panel:  Lab Results  Component Value Date   NA 139 07/09/2023   K 4.1 07/09/2023   CO2 28 07/09/2023   GLUCOSE 174 (H) 07/09/2023   BUN 43 (H) 07/09/2023   CREATININE 2.79 (H) 07/09/2023   CALCIUM  8.3 (L) 07/09/2023   GFRNONAA 21 (L) 07/09/2023   GFRAA 34 (L) 04/03/2019   Lipid Panel:  Lab Results  Component Value Date   LDLCALC 47 03/05/2022   HgbA1c:  Lab Results  Component Value Date   HGBA1C 7.2 (H) 03/01/2023   Urine Drug Screen:     Component Value Date/Time   LABOPIA NONE DETECTED 05/02/2021 0531   COCAINSCRNUR NONE DETECTED 05/02/2021 0531   LABBENZ NONE DETECTED 05/02/2021 0531   AMPHETMU NONE DETECTED 05/02/2021 0531   THCU  NONE DETECTED 05/02/2021 0531   LABBARB NONE DETECTED 05/02/2021 0531    Alcohol  Level     Component Value Date/Time   ETH <10 05/02/2021 0003   INR  Lab Results  Component Value Date   INR 1.1 03/01/2023   APTT  Lab Results  Component Value Date   APTT 31 11/24/2018   AED levels: No results found for: PHENYTOIN, ZONISAMIDE, LAMOTRIGINE, LEVETIRACETA  CT Head without contrast(Personally reviewed): Pending  CT angio Head and Neck with contrast(Personally reviewed): Pending  MRI Brain(Personally reviewed): Unclear as if he is able to get the MRI with his pacemaker.  ASSESSMENT   KYVON HU is a 89 y.o. male with stroke risk factors including diabetes, hypertension, hyperlipidemia who presents with sudden onset left arm weakness and numbness.  He took a nap and  woke up 20 minutes later with the symptoms.  Initially thought that he just slept on his arm and will get better but when it did not improve, he came to the ED for further evaluation and workup.  The sensory loss does not seem to be in a dermatomal distribution, the weakness also seems to be along multiple nerve root distribution in the left arm.  My suspicion for stroke is higher than radiculopathy.  RECOMMENDATIONS  - Frequent Neuro checks per stroke unit protocol - Recommend brain imaging with MRI Brain without contrast if he can with his pacemaker. - Recommend Vascular imaging with CT angio of the head and neck along with CT C spine. - Recommend obtaining TTE - Recommend obtaining Lipid panel with LDL - Please start statin if LDL > 70 - Recommend HbA1c to evaluate for diabetes and how well it is controlled. - Antithrombotic(please do not start antithrombotic before CT head is obtained.) -aspirin  81 mg daily along with Plavix  75 mg daily for 21 days, followed by aspirin  81 mg daily alone if CT Head is negative for ICH. - Recommend DVT ppx - SBP goal -aim for gradual normotension.- Recommend Telemetry  monitoring for arrythmia - Recommend bedside swallow screen prior to PO intake. - Stroke education booklet - Recommend PT/OT/SLP consult - Interrogate pacemaker to evaluate for paroxysmal atrial fibrillation.  ______________________________________________________________________  Plan discussed with ED triage provider Carlo Cornish and [place].  Plan also discussed with patient and his grandson at the bedside.  Signed, Tephanie Escorcia, MD [facility]

## 2024-03-13 NOTE — ED Triage Notes (Signed)
 Pt endorses that he has not been having any pain in his Lt hand but has had decreased sensation & lack of control to grasp items in that hand since yesterday evening around 1400.

## 2024-03-13 NOTE — ED Provider Notes (Signed)
 [place] EMERGENCY DEPARTMENT AT [facility] Provider Note   CSN: [id number] Arrival date & time: 03/13/24  1655     Patient presents with: Lt Hand Numbness   [name] is a 89 y.o. male.  {Add pertinent medical, surgical, social history, OB history to HPI:32947} HPI     Prior to Admission medications   Medication Sig Start Date End Date Taking? Authorizing Provider  acetaminophen  (TYLENOL ) 500 MG tablet Take 500 mg by mouth in the morning and at bedtime.   Yes [provider]  Cholecalciferol 50 MCG (2000 UT) CAPS Take 2,000 Units by mouth daily.   Yes [provider]  clopidogrel  (PLAVIX ) 75 MG tablet Take 1 tablet (75 mg total) by mouth daily. Restart 10/15 07/10/23  Yes Singh, Prashant K, MD  Coenzyme Q10 (COQ10) 150 MG CAPS Take 150 mg by mouth every evening.   Yes [provider]  Continuous Glucose Sensor (FREESTYLE LIBRE 3 SENSOR) MISC Apply topically.   Yes [provider]  docusate sodium  (COLACE) 100 MG capsule Take 100 mg by mouth 2 (two) times daily.   Yes [provider]  Iron , Ferrous Sulfate , 325 (65 Fe) MG TABS Take 325 mg by mouth daily. 03/04/23  Yes Jadine Toribio SQUIBB, MD  levothyroxine  (SYNTHROID , LEVOTHROID) 25 MCG tablet Take 25 mcg by mouth every morning. 02/09/14  Yes [provider]  memantine  (NAMENDA ) 10 MG tablet Take 1 tablet (10 mg total) by mouth 2 (two) times daily. 03/03/24  Yes Wertman, Sara E, PA-C  metoprolol  succinate (TOPROL -XL) 25 MG 24 hr tablet Take 1 tablet (25 mg total) by mouth daily. 03/11/24  Yes Croitoru, Mihai, MD  Multiple Vitamin (RENAL MULTIVITAMIN/ZINC  PO) Take 1 tablet by mouth daily.   Yes [provider]  NOVOLIN 70/30 KWIKPEN (70-30) 100 UNIT/ML KwikPen Inject into the skin 2 (two) times daily. Takes 50 units in the morning and 13 units at night. Patient taking differently: Inject 8-40 Units into the skin See admin instructions. Inject 40 units into  the skin in the morning and 8 units at night 04/25/22  Yes [provider]  OVER THE COUNTER MEDICATION Take 2 tablets by mouth in the morning. Emma OTC medication   Yes [provider]  pantoprazole  (PROTONIX ) 40 MG tablet Take 1 tablet (40 mg total) by mouth 2 (two) times daily. Patient taking differently: Take 20 mg by mouth daily. 07/09/23  Yes Singh, Prashant K, MD  polyethylene glycol (MIRALAX  / GLYCOLAX ) 17 g packet Take 17 g by mouth daily. 08/25/21  Yes Armenta Canning, MD  rosuvastatin  (CRESTOR ) 40 MG tablet Take 1 tablet by mouth once daily 09/24/23  Yes Croitoru, Mihai, MD  bisacodyl  (DULCOLAX) 5 MG EC tablet Take 5 mg by mouth daily as needed for severe constipation. Patient not taking: Reported on 03/13/2024    [provider]  furosemide  (LASIX ) 20 MG tablet Take 20 mg by mouth daily as needed. Patient not taking: Reported on 03/13/2024    [provider]  nitroGLYCERIN  (NITROSTAT ) 0.4 MG SL tablet DISSOLVE ONE TABLET UNDER THE TONGUE EVERY 5 MINUTES AS NEEDED FOR CHEST PAIN. Patient not taking: Reported on 03/03/2024 03/13/22   Croitoru, Jerel, MD    Allergies: Patient has no known allergies.    Review of Systems  Updated Vital Signs BP (!) 146/62   Pulse 64   Temp 98.5 F (36.9 C) (Oral)   Resp 16   Ht 5' 6 (1.676 m)   Wt 115.7  kg   SpO2 100%   BMI 41.16 kg/m   Physical Exam  (all labs ordered are listed, but only abnormal results are displayed) Labs Reviewed  COMPREHENSIVE METABOLIC PANEL WITH GFR - Abnormal; Notable for the following components:      Result Value   BUN 43 (*)    Creatinine, Ser 2.16 (*)    GFR, Estimated 28 (*)    All other components within normal limits  CBC  DIFFERENTIAL  ETHANOL  HEMOGLOBIN A1C  LIPID PANEL  RAPID URINE DRUG SCREEN, HOSP PERFORMED  PROTIME-INR  BASIC METABOLIC PANEL WITH GFR  MAGNESIUM   PHOSPHORUS  CBC  CBG MONITORING, ED    EKG: None  Radiology: CT Cervical Spine Wo  Contrast Result Date: 03/13/2024 EXAM: CT CERVICAL SPINE WITHOUT CONTRAST 03/13/2024 08:16:16 PM TECHNIQUE: CT of the cervical spine was performed without the administration of intravenous contrast. Multiplanar reformatted images are provided for review. Automated exposure control, iterative reconstruction, and/or weight based adjustment of the mA/kV was utilized to reduce the radiation dose to as low as reasonably achievable. COMPARISON: Comparison with 03/04/2022. CLINICAL HISTORY: Arm weakness. Chief complaints: Left hand numbness. Neuro deficit, acute, stroke suspected. FINDINGS: CERVICAL SPINE: BONES AND ALIGNMENT: No acute fracture or traumatic malalignment. 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 No severe spinal canal or neural foramina narrowing. SOFT TISSUES: No prevertebral soft tissue swelling. IMPRESSION: 1. No acute abnormality of the cervical spine. 2. 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 Electronically signed by: Norman Gatlin MD 03/13/2024 08:24 PM EDT RP Workstation: [id number]   CT HEAD WO CONTRAST Result Date: 03/13/2024 EXAM: CT HEAD WITHOUT CONTRAST 03/13/2024 08:16:16 PM TECHNIQUE: CT of the head was performed without the administration of intravenous contrast. Automated exposure control, iterative reconstruction, and/or weight based adjustment of the mA/kV was utilized to reduce the radiation dose to as low as reasonably achievable. COMPARISON: Comparison with 05/21/2022. CLINICAL HISTORY: Neuro deficit, acute, stroke suspected. Chief complaints; Lt Hand Numbness; CT HEAD WO CONTRAST; Neuro deficit, acute, stroke suspected; CT Cervical Spine Wo Contrast; arm weakness. FINDINGS: BRAIN AND VENTRICLES: No acute hemorrhage. No evidence of acute infarct. No hydrocephalus. No extra-axial collection. No mass effect or midline shift. Chronic microvascular ischemia and  generalized atrophy. ORBITS: No acute abnormality. SINUSES: Changes of Chronic right maxillary sinusitis. SOFT TISSUES AND SKULL: No acute soft tissue abnormality. No skull fracture. IMPRESSION: 1. No acute intracranial abnormality. Electronically signed by: Norman Gatlin MD 03/13/2024 08:21 PM EDT RP Workstation: [id number]    {Document cardiac monitor, telemetry assessment procedure when appropriate:32947} Procedures   Medications Ordered in the ED  heparin  injection 5,000 Units (has no administration in time range)  sodium chloride  flush (NS) 0.9 % injection 3 mL (has no administration in time range)  acetaminophen  (TYLENOL ) tablet 1,000 mg (has no administration in time range)  albuterol  (PROVENTIL ) (2.5 MG/3ML) 0.083% nebulizer solution 2.5 mg (has no administration in time range)  melatonin tablet 6 mg (has no administration in time range)  ondansetron  (ZOFRAN ) injection 4 mg (has no administration in time range)  polyethylene glycol (MIRALAX  / GLYCOLAX ) packet 17 g (has no administration in time range)   stroke: early stages of recovery book (has no administration in time range)  rosuvastatin  (CRESTOR ) tablet 40 mg (has no administration in time range)  memantine  (NAMENDA ) tablet 10 mg (10 mg Oral Given 03/13/24 2326)  levothyroxine  (SYNTHROID ) tablet 25 mcg (has no administration in time range)  insulin   aspart protamine- aspart (NOVOLOG  MIX 70/30) injection 30 Units (has no administration in time range)  pantoprazole  (PROTONIX ) EC tablet 20 mg (has no administration in time range)  clopidogrel  (PLAVIX ) tablet 75 mg (has no administration in time range)  aspirin  EC tablet 81 mg (has no administration in time range)  insulin  aspart (novoLOG ) injection 0-6 Units (has no administration in time range)      {Click here for ABCD2, HEART and other calculators REFRESH Note before signing:1}                              Medical Decision Making Risk Decision regarding  hospitalization.   ***  {Document critical care time when appropriate  Document review of labs and clinical decision tools ie CHADS2VASC2, etc  Document your independent review of radiology images and any outside records  Document your discussion with family members, caretakers and with consultants  Document social determinants of health affecting pt's care  Document your decision making why or why not admission, treatments were needed:32947:::1}   Final diagnoses:  None    ED Discharge Orders     None

## 2024-03-13 NOTE — ED Triage Notes (Signed)
 Patients grandson brought patient to ED after having left hand pain and numbness since yesterday afternoon around 2pm. He's states he also is having a hard time moving his hand. Denies numbness, tingling, weakness anywhere else.  PMH: strokes

## 2024-03-13 NOTE — ED Notes (Addendum)
 Pt asymptomatic of 73 CBG, swallow screen done by this RN with Neurology with him in triage & he passed. Orange Juice & Saltine crackers given to pt after passing swallow eval.

## 2024-03-14 ENCOUNTER — Inpatient Hospital Stay (HOSPITAL_COMMUNITY)

## 2024-03-14 DIAGNOSIS — R2990 Unspecified symptoms and signs involving the nervous system: Secondary | ICD-10-CM | POA: Diagnosis not present

## 2024-03-14 DIAGNOSIS — I639 Cerebral infarction, unspecified: Secondary | ICD-10-CM

## 2024-03-14 DIAGNOSIS — I130 Hypertensive heart and chronic kidney disease with heart failure and stage 1 through stage 4 chronic kidney disease, or unspecified chronic kidney disease: Secondary | ICD-10-CM

## 2024-03-14 DIAGNOSIS — I340 Nonrheumatic mitral (valve) insufficiency: Secondary | ICD-10-CM | POA: Diagnosis not present

## 2024-03-14 DIAGNOSIS — I5042 Chronic combined systolic (congestive) and diastolic (congestive) heart failure: Secondary | ICD-10-CM

## 2024-03-14 DIAGNOSIS — E1151 Type 2 diabetes mellitus with diabetic peripheral angiopathy without gangrene: Secondary | ICD-10-CM

## 2024-03-14 DIAGNOSIS — E785 Hyperlipidemia, unspecified: Secondary | ICD-10-CM

## 2024-03-14 DIAGNOSIS — I361 Nonrheumatic tricuspid (valve) insufficiency: Secondary | ICD-10-CM

## 2024-03-14 DIAGNOSIS — E1122 Type 2 diabetes mellitus with diabetic chronic kidney disease: Secondary | ICD-10-CM | POA: Diagnosis not present

## 2024-03-14 DIAGNOSIS — F0150 Vascular dementia without behavioral disturbance: Secondary | ICD-10-CM

## 2024-03-14 DIAGNOSIS — I6782 Cerebral ischemia: Secondary | ICD-10-CM | POA: Diagnosis not present

## 2024-03-14 DIAGNOSIS — N184 Chronic kidney disease, stage 4 (severe): Secondary | ICD-10-CM

## 2024-03-14 LAB — ECHOCARDIOGRAM COMPLETE
AR max vel: 2.34 cm2
AV Peak grad: 6 mmHg
Ao pk vel: 1.22 m/s
Area-P 1/2: 2.8 cm2
Height: 66 in
S' Lateral: 3.1 cm
Weight: 4080 [oz_av]

## 2024-03-14 LAB — CBC
HCT: 43.4 % (ref 39.0–52.0)
Hemoglobin: 13.6 g/dL (ref 13.0–17.0)
MCH: 29.4 pg (ref 26.0–34.0)
MCHC: 31.3 g/dL (ref 30.0–36.0)
MCV: 93.7 fL (ref 80.0–100.0)
Platelets: 171 K/uL (ref 150–400)
RBC: 4.63 MIL/uL (ref 4.22–5.81)
RDW: 14.5 % (ref 11.5–15.5)
WBC: 8.6 K/uL (ref 4.0–10.5)
nRBC: 0 % (ref 0.0–0.2)

## 2024-03-14 LAB — BASIC METABOLIC PANEL WITH GFR
Anion gap: 11 (ref 5–15)
BUN: 42 mg/dL — ABNORMAL HIGH (ref 8–23)
CO2: 26 mmol/L (ref 22–32)
Calcium: 9 mg/dL (ref 8.9–10.3)
Chloride: 101 mmol/L (ref 98–111)
Creatinine, Ser: 2.27 mg/dL — ABNORMAL HIGH (ref 0.61–1.24)
GFR, Estimated: 27 mL/min — ABNORMAL LOW (ref >60)
Glucose, Bld: 126 mg/dL — ABNORMAL HIGH (ref 70–99)
Potassium: 4.6 mmol/L (ref 3.5–5.1)
Sodium: 138 mmol/L (ref 135–145)

## 2024-03-14 LAB — PROTIME-INR
INR: 1.1 (ref 0.8–1.2)
Prothrombin Time: 14.6 s (ref 11.4–15.2)

## 2024-03-14 LAB — GLUCOSE, CAPILLARY
Glucose-Capillary: 179 mg/dL — ABNORMAL HIGH (ref 70–99)
Glucose-Capillary: 160 mg/dL — ABNORMAL HIGH (ref 70–99)

## 2024-03-14 LAB — RAPID URINE DRUG SCREEN, HOSP PERFORMED
Amphetamines: NOT DETECTED
Barbiturates: NOT DETECTED
Benzodiazepines: NOT DETECTED
Cocaine: NOT DETECTED
Opiates: NOT DETECTED
Tetrahydrocannabinol: NOT DETECTED

## 2024-03-14 LAB — MAGNESIUM: Magnesium: 2.2 mg/dL (ref 1.7–2.4)

## 2024-03-14 LAB — CBG MONITORING, ED
Glucose-Capillary: 108 mg/dL — ABNORMAL HIGH (ref 70–99)
Glucose-Capillary: 160 mg/dL — ABNORMAL HIGH (ref 70–99)

## 2024-03-14 LAB — PHOSPHORUS: Phosphorus: 3.7 mg/dL (ref 2.5–4.6)

## 2024-03-14 MED ORDER — TICAGRELOR 90 MG PO TABS
90.0000 mg | ORAL_TABLET | Freq: Two times a day (BID) | ORAL | Status: DC
  Administered 2024-03-14 – 2024-03-16 (×5): 90 mg via ORAL
  Filled 2024-03-14 (×5): qty 1

## 2024-03-14 NOTE — Progress Notes (Signed)
 PROGRESS NOTE  [name] FMW:[id number] DOB: 03-17-1932 DOA: 03/13/2024 PCP: Chet Mad, DO   LOS: 1 day   Brief Narrative / Interim history: 89 y.o. male with hx of prior CVA, vascular dementia, CAD with PCI, carotid artery disease, systolic/diastolic HF, SSS s/p dual chamber PPM ([employer]; per Cards generator is MRI conditional, his leads (SJM atrial 1022T, ventricular 1216T) are not MRI compatible), CKD4, HTN, HLD, DM2, hypothyroidism, OSA, Obesity, DJD, mood d/o, who presented with left hand weakness and numbness.  Initially he thought this was related to how he slept, but upon persistence of his symptoms decided to come to the hospital  Subjective / 24h Interval events: He is doing well this morning, he feels like his left arm is slowly waking up but still not back to normal  Assesement and Plan: Principal problem Left hand weakness, numbness -definitely concern for CVA, he has multiple risk factors.  Neurology consulted and following, unable to obtain an MRI due to his leads not being compatible -Continue stroke workup, stroke team to see today.  He is on dual antiplatelet therapy with aspirin  and Plavix   Active problems History of CVA-workup underway, PT eval pending  Vascular dementia-very mild, alert and oriented x 4.  Continue memantine   CAD, history of PCI-no chest pain, this appears stable  SSS status post pacemaker-noted, keep on telemetry  Carotid artery disease-workup per neurology  CKD stage IV -creatinine is at baseline  Hypothyroidism-continue Synthroid   Hyperlipidemia-continue statin  OSA-not on CPAP  Obesity, morbid-BMI 41  DM 2, insulin -dependent-continue insulin  as below  Lab Results  Component Value Date   HGBA1C 5.2 03/13/2024   CBG (last 3)  Recent Labs    03/13/24 1824 03/14/24 0017  GLUCAP 71 108*    Scheduled Meds:   stroke: early stages of recovery book   Does not apply Once   aspirin  EC  81 mg Oral Daily    betamethasone  acetate-betamethasone  sodium phosphate   3 mg Intramuscular Once   clopidogrel   75 mg Oral Daily   heparin   5,000 Units Subcutaneous Q8H   insulin  aspart  0-6 Units Subcutaneous TID WC   insulin  aspart protamine- aspart  30 Units Subcutaneous Q breakfast   levothyroxine   25 mcg Oral q morning   memantine   10 mg Oral BID   pantoprazole   20 mg Oral Q0600   rosuvastatin   40 mg Oral Daily   sodium chloride  flush  3 mL Intravenous Q12H   Continuous Infusions: PRN Meds:.acetaminophen , albuterol , melatonin, ondansetron  (ZOFRAN ) IV, polyethylene glycol  Current Outpatient Medications  Medication Instructions   acetaminophen  (TYLENOL ) 500 mg, Oral, 2 times daily   Cholecalciferol 2,000 Units, Oral, Daily   clopidogrel  (PLAVIX ) 75 mg, Oral, Daily, Restart 10/15   Continuous Glucose Sensor (FREESTYLE LIBRE 3 SENSOR) MISC Apply topically.   COQ10 150 mg, Oral, Every evening   docusate sodium  (COLACE) 100 mg, Oral, 2 times daily   Dulcolax 5 mg, Daily PRN   furosemide  (LASIX ) 20 mg, Daily PRN   Iron  (Ferrous Sulfate ) 325 mg, Oral, Daily   levothyroxine  (SYNTHROID ) 25 mcg, Oral, Every morning   memantine  (NAMENDA ) 10 mg, Oral, 2 times daily   metoprolol  succinate (TOPROL -XL) 25 mg, Oral, Daily   Multiple Vitamin (RENAL MULTIVITAMIN/ZINC  PO) 1 tablet, Oral, Daily   nitroGLYCERIN  (NITROSTAT ) 0.4 MG SL tablet DISSOLVE ONE TABLET UNDER THE TONGUE EVERY 5 MINUTES AS NEEDED FOR CHEST PAIN.   NOVOLIN 70/30 KWIKPEN (70-30) 100 UNIT/ML KwikPen 2 times daily   OVER THE  COUNTER MEDICATION 2 tablets, Oral, Every morning, Emma OTC medication    pantoprazole  (PROTONIX ) 40 mg, Oral, 2 times daily   polyethylene glycol (MIRALAX  / GLYCOLAX ) 17 g, Oral, Daily   rosuvastatin  (CRESTOR ) 40 mg, Oral, Daily    Diet Orders (From admission, onward)     Start     Ordered   03/13/24 2214  Diet heart healthy/carb modified Room service appropriate? Yes; Fluid consistency: Thin  Diet effective now        Comments: OK for diet if passes swallow  Question Answer Comment  Diet-HS Snack? Nothing   Room service appropriate? Yes   Fluid consistency: Thin      03/13/24 2213            DVT prophylaxis: heparin  injection 5,000 Units Start: 03/14/24 0600   Lab Results  Component Value Date   PLT 161 03/13/2024      Code Status: Full Code  Family Communication: No family at bedside  Status is: Inpatient Remains inpatient appropriate because: Stroke   Level of care: Telemetry Medical  Consultants:  Neurology  Objective: Vitals:   03/14/24 0401 03/14/24 0620 03/14/24 0823 03/14/24 0945  BP: (!) 147/64 (!) 163/74    Pulse: 60 65  (!) 59  Resp: 14 17  16   Temp: 98.8 F (37.1 C)  97.8 F (36.6 C)   TempSrc: Oral  Oral   SpO2: 100% 100%  98%  Weight:      Height:       No intake or output data in the 24 hours ending 03/14/24 1101 Wt Readings from Last 3 Encounters:  03/13/24 115.7 kg  03/03/24 102.1 kg  10/22/23 103.9 kg    Examination:  Constitutional: NAD Eyes: no scleral icterus ENMT: Mucous membranes are moist.  Neck: normal, supple Respiratory: clear to auscultation bilaterally, no wheezing, no crackles.  Cardiovascular: Regular rate and rhythm, no murmurs / rubs / gallops. Abdomen: non distended, no tenderness. Bowel sounds positive.  Musculoskeletal: no clubbing / cyanosis.   Data Reviewed: I have independently reviewed following labs and imaging studies   CBC Recent Labs  Lab 03/13/24 1801  WBC 8.9  HGB 13.3  HCT 42.7  PLT 161  MCV 95.3  MCH 29.7  MCHC 31.1  RDW 14.5  LYMPHSABS 1.6  MONOABS 0.9  EOSABS 0.1  BASOSABS 0.0    Recent Labs  Lab 03/13/24 1801 03/13/24 2058 03/13/24 2333  NA 138  --  138  K 4.6  --  4.6  CL 104  --  101  CO2 26  --  26  GLUCOSE 70  --  126*  BUN 43*  --  42*  CREATININE 2.16*  --  2.27*  CALCIUM  9.0  --  9.0  AST 30  --   --   ALT 16  --   --   ALKPHOS 62  --   --   BILITOT 0.9  --   --    ALBUMIN  3.7  --   --   MG  --   --  2.2  INR  --   --  1.1  HGBA1C  --  5.2  --     ------------------------------------------------------------------------------------------------------------------ Recent Labs    03/13/24 2058  CHOL 126  HDL 41  LDLCALC 69  TRIG 78  CHOLHDL 3.1    Lab Results  Component Value Date   HGBA1C 5.2 03/13/2024   ------------------------------------------------------------------------------------------------------------------ No results for input(s): TSH, T4TOTAL, T3FREE, THYROIDAB in the last 72 hours.  Invalid input(s): FREET3  Cardiac Enzymes No results for input(s): CKMB, TROPONINI, MYOGLOBIN in the last 168 hours.  Invalid input(s): CK ------------------------------------------------------------------------------------------------------------------    Component Value Date/Time   BNP 114.7 (H) 07/09/2023 1033    CBG: Recent Labs  Lab 03/13/24 1824 03/14/24 0017  GLUCAP 71 108*    No results found for this or any previous visit (from the past 240 hours).   Radiology Studies: VAS US  CAROTID Result Date: 03/14/2024 Carotid Arterial Duplex Study Patient Name:  [name]  Date of Exam:   03/14/2024 Medical Rec #: [id number]       Accession #:    [id number] Date of Birth: 08-Oct-1931       Patient Gender: M Patient Age:   46 years Exam Location:  [facility] Procedure:      VAS US  CAROTID Referring Phys: DORN DAWSON --------------------------------------------------------------------------------  Indications:       CVA, Carotid artery disease and left hand weakness and                    hypoesthesia. Risk Factors:      Hypertension, hyperlipidemia, Diabetes. Other Factors:     CKD4. Comparison Study:  03/05/22 - Bilateral 1-39% ICA stenosis Performing Technologist: [name] Sturdivant-Jones RDMS, RVT  Examination Guidelines: A complete evaluation includes B-mode imaging, spectral Doppler, color Doppler, and  power Doppler as needed of all accessible portions of each vessel. Bilateral testing is considered an integral part of a complete examination. Limited examinations for reoccurring indications may be performed as noted.  Right Carotid Findings: +----------+--------+--------+--------+------------------+--------+           PSV cm/sEDV cm/sStenosisPlaque DescriptionComments +----------+--------+--------+--------+------------------+--------+ CCA Prox  82      17                                         +----------+--------+--------+--------+------------------+--------+ CCA Distal79      13                                         +----------+--------+--------+--------+------------------+--------+ ICA Prox  136     23      1-39%   calcific and focal         +----------+--------+--------+--------+------------------+--------+ ICA Mid   82      20                                         +----------+--------+--------+--------+------------------+--------+ ICA Distal81      20                                         +----------+--------+--------+--------+------------------+--------+ ECA       97      19                                         +----------+--------+--------+--------+------------------+--------+ +----------+--------+-------+----------------+-------------------+           PSV cm/sEDV cmsDescribe        Arm Pressure (mmHG) +----------+--------+-------+----------------+-------------------+ Subclavian101  Multiphasic, WNL                    +----------+--------+-------+----------------+-------------------+ +---------+--------+--+--------+--+---------+ VertebralPSV cm/s37EDV cm/s12Antegrade +---------+--------+--+--------+--+---------+  Left Carotid Findings: +----------+--------+--------+--------+------------------+--------+           PSV cm/sEDV cm/sStenosisPlaque DescriptionComments  +----------+--------+--------+--------+------------------+--------+ CCA Prox  82      15                                         +----------+--------+--------+--------+------------------+--------+ CCA Distal56      13                                         +----------+--------+--------+--------+------------------+--------+ ICA Prox  176     46      40-59%  calcific                   +----------+--------+--------+--------+------------------+--------+ ICA Mid   126     31                                         +----------+--------+--------+--------+------------------+--------+ ICA Distal107     32                                         +----------+--------+--------+--------+------------------+--------+ ECA       116                                                +----------+--------+--------+--------+------------------+--------+ +----------+--------+--------+----------------+-------------------+           PSV cm/sEDV cm/sDescribe        Arm Pressure (mmHG) +----------+--------+--------+----------------+-------------------+ Subclavian129             Multiphasic, WNL                    +----------+--------+--------+----------------+-------------------+ +---------+--------+--+--------+--+---------+ VertebralPSV cm/s30EDV cm/s10Antegrade +---------+--------+--+--------+--+---------+   Summary: Right Carotid: Velocities in the right ICA are consistent with a 1-39% stenosis. Left Carotid: Velocities in the left ICA are consistent with a 40-59% stenosis. Vertebrals:  Bilateral vertebral arteries demonstrate antegrade flow. Subclavians: Normal flow hemodynamics were seen in bilateral subclavian              arteries. *See table(s) above for measurements and observations.     Preliminary    CT Cervical Spine Wo Contrast Result Date: 03/13/2024 EXAM: CT CERVICAL SPINE WITHOUT CONTRAST 03/13/2024 08:16:16 PM TECHNIQUE: CT of the cervical spine was performed  without the administration of intravenous contrast. Multiplanar reformatted images are provided for review. Automated exposure control, iterative reconstruction, and/or weight based adjustment of the mA/kV was utilized to reduce the radiation dose to as low as reasonably achievable. COMPARISON: Comparison with 03/04/2022. CLINICAL HISTORY: Arm weakness. Chief complaints: Left hand numbness. Neuro deficit, acute, stroke suspected. FINDINGS: CERVICAL SPINE: BONES AND ALIGNMENT: No acute fracture or traumatic malalignment. 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 No severe spinal canal or neural foramina narrowing. SOFT TISSUES: No prevertebral soft tissue swelling. IMPRESSION: 1.  No acute abnormality of the cervical spine. 2. 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 Electronically signed by: Norman Gatlin MD 03/13/2024 08:24 PM EDT RP Workstation: [id number]   CT HEAD WO CONTRAST Result Date: 03/13/2024 EXAM: CT HEAD WITHOUT CONTRAST 03/13/2024 08:16:16 PM TECHNIQUE: CT of the head was performed without the administration of intravenous contrast. Automated exposure control, iterative reconstruction, and/or weight based adjustment of the mA/kV was utilized to reduce the radiation dose to as low as reasonably achievable. COMPARISON: Comparison with 05/21/2022. CLINICAL HISTORY: Neuro deficit, acute, stroke suspected. Chief complaints; Lt Hand Numbness; CT HEAD WO CONTRAST; Neuro deficit, acute, stroke suspected; CT Cervical Spine Wo Contrast; arm weakness. FINDINGS: BRAIN AND VENTRICLES: No acute hemorrhage. No evidence of acute infarct. No hydrocephalus. No extra-axial collection. No mass effect or midline shift. Chronic microvascular ischemia and generalized atrophy. ORBITS: No acute abnormality. SINUSES: Changes of Chronic right maxillary sinusitis. SOFT TISSUES AND SKULL: No acute soft tissue  abnormality. No skull fracture. IMPRESSION: 1. No acute intracranial abnormality. Electronically signed by: Norman Gatlin MD 03/13/2024 08:21 PM EDT RP Workstation: [id number]   Nilda Fendt, MD, PhD [facility]  Between 7 am - 7 pm I am available, please contact me via Amion (for emergencies) or Securechat (non urgent messages)  Between 7 pm - 7 am I am not available, please contact night coverage MD/APP via Amion

## 2024-03-14 NOTE — Plan of Care (Signed)
   Problem: Education: Goal: Knowledge of disease or condition will improve Outcome: Progressing Goal: Knowledge of secondary prevention will improve (MUST DOCUMENT ALL) Outcome: Progressing

## 2024-03-14 NOTE — Progress Notes (Signed)
 Carotid duplex  has been completed. Refer to [facility] under chart review to view preliminary results.   03/14/2024  9:13 AM [name], [name] BIRCH

## 2024-03-14 NOTE — Progress Notes (Signed)
 Attempted to complete admission documentation, pt off the floor. Spoke with pts grandson and able to answer some questions

## 2024-03-14 NOTE — Evaluation (Signed)
 Occupational Therapy Evaluation Patient Details Name: [name] MRN: [id number] DOB: 1931/11/11 Today's Date: 03/14/2024   History of Present Illness   Pt is a 77  male admitted with L hand weakness and numbness. Unable to do MRI due to PPM. Stroke work up.PMH: CVA, vascular dementia, CAD, PPM, CKD4, HTN, obesity, DJD, carotid artery disease.     Clinical Impressions Pt admitted with the above diagnosis and has the deficits outlined below. Pt would benefit from cont OT to increase coordination and use of LUE so he can perform adls at baseline. Pt would benefit from HHOT vs OPOT for LUE rehab if possible. Unsure if family can get pt to OPOT. At baseline pt has an aid 3x week that comes to assist with adls and otherwise has family to assist 24/7. Do feel pt will be safe returning home with family at current level.  Will continue to follow for adls and LUE rehab.      If plan is discharge home, recommend the following:   A little help with walking and/or transfers;A lot of help with bathing/dressing/bathroom;Assistance with cooking/housework;Assist for transportation;Help with stairs or ramp for entrance     Functional Status Assessment   Patient has had a recent decline in their functional status and demonstrates the ability to make significant improvements in function in a reasonable and predictable amount of time.     Equipment Recommendations   None recommended by OT     Recommendations for Other Services         Precautions/Restrictions   Precautions Precautions: Fall Recall of Precautions/Restrictions: Intact Restrictions Weight Bearing Restrictions Per Provider Order: No     Mobility Bed Mobility Overal bed mobility: Needs Assistance Bed Mobility: Supine to Sit, Sit to Supine     Supine to sit: Min assist Sit to supine: Min assist   General bed mobility comments: pt does not get in and out of a bed at home. sleeps in lift chair.     Transfers Overall transfer level: Needs assistance Equipment used: Rollator (4 wheels) Transfers: Sit to/from Stand, Bed to chair/wheelchair/BSC Sit to Stand: Min assist     Step pivot transfers: Contact guard assist     General transfer comment: assist to remember to use brakes on rollator. Daughter states he forgets at home too.      Balance Overall balance assessment: Mild deficits observed, not formally tested                                         ADL either performed or assessed with clinical judgement   ADL Overall ADL's : Needs assistance/impaired Eating/Feeding: Set up;Sitting   Grooming: Wash/dry face;Wash/dry hands;Oral care;Set up;Sitting   Upper Body Bathing: Set up;Sitting   Lower Body Bathing: Moderate assistance;Sit to/from stand   Upper Body Dressing : Moderate assistance;Sitting   Lower Body Dressing: Maximal assistance;Sit to/from stand   Toilet Transfer: Minimal assistance;Ambulation;Comfort height toilet;Rollator (4 wheels);Grab bars Toilet Transfer Details (indicate cue type and reason): Pt walked to bathroom with rollator Toileting- Clothing Manipulation and Hygiene: Minimal assistance;Sit to/from stand Toileting - Clothing Manipulation Details (indicate cue type and reason): assist to ensure cleanliess. pt is r handed but often uses L bc R shoulder so impaired.  now L hand is weak.     Functional mobility during ADLs: Minimal assistance;Rollator (4 wheels) General ADL Comments: Pt sleeps in recliner at home and  overall is close to baseline other than L hand being week and uncoordiated.     Vision Baseline Vision/History: 1 Wears glasses Ability to See in Adequate Light: 0 Adequate Patient Visual Report: No change from baseline;Other (comment) (states diabetes affects his vision) Vision Assessment?: No apparent visual deficits     Perception         Praxis         Pertinent Vitals/Pain Pain Assessment Pain  Assessment: Faces Pain Score: 2  Faces Pain Scale: Hurts a little bit Pain Location: all over per report Pain Descriptors / Indicators: Discomfort Pain Intervention(s): Monitored during session, Repositioned     Extremity/Trunk Assessment Upper Extremity Assessment Upper Extremity Assessment: Right hand dominant;RUE deficits/detail;LUE deficits/detail RUE Deficits / Details: R shoulder ROM limited to 50 degrees flexion (normal for him) otherwise [facility] RUE: Shoulder pain with ROM;Shoulder pain at rest RUE Sensation: WNL RUE Coordination: decreased gross motor LUE Deficits / Details: WFL except in hand. Pt can make full fist but has difficulty fully extending fingers and ab/adducting fingers.  unaable to oppose fingers fully LUE Sensation: decreased light touch;decreased proprioception LUE Coordination: decreased fine motor   Lower Extremity Assessment Lower Extremity Assessment: Defer to PT evaluation   Cervical / Trunk Assessment Cervical / Trunk Assessment: Kyphotic   Communication Communication Communication: No apparent difficulties   Cognition Arousal: Alert Behavior During Therapy: WFL for tasks assessed/performed Cognition: History of cognitive impairments             OT - Cognition Comments: Pt is at baseline per family with cognition. Pt oriented, follows all commands and memory for home setup and PLOF good.                 Following commands: Intact       Cueing  General Comments   Cueing Techniques: Verbal cues  Pt most limited by L hand weakness.   Exercises Exercises: Hand exercises, Hand activities Hand Exercises Digit Composite Flexion: AROM, Left, 10 reps, Seated Composite Extension: AROM, Left, 10 reps, Seated Thumb Abduction: AROM, Left, 10 reps, Seated Thumb Adduction: AROM, Left, 10 reps, Seated Opposition: AAROM, Left, 10 reps, Seated Hand Activities Pick Up, Palm, Put Down: Left, 10 reps, Seated Pick Up, Palm, Put Down Limitations:  uncoordinated   Shoulder Instructions      Home Living Family/patient expects to be discharged to:: Private residence Living Arrangements: Children Available Help at Discharge: Family;Available 24 hours/day Type of Home: House Home Access: Ramped entrance     Home Layout: Multi-level;Able to live on main level with bedroom/bathroom     Bathroom Shower/Tub: Walk-in shower;Curtain   Bathroom Toilet: Standard Bathroom Accessibility: Yes How Accessible: Accessible via walker Home Equipment: Rollator (4 wheels);Cane - single point;Grab bars - toilet;Grab bars - tub/shower;Shower seat;BSC/3in1          Prior Functioning/Environment Prior Level of Function : Needs assist       Physical Assist : ADLs (physical)   ADLs (physical): Bathing;Dressing;Grooming;IADLs Mobility Comments: ambulates with rollator at all times ADLs Comments: aid comes in 3x week and he gets shower on Thursdays.  Aid assists with bathing, donning pants, grooming.    OT Problem List: Decreased strength;Decreased range of motion;Impaired balance (sitting and/or standing);Decreased coordination;Pain;Impaired UE functional use   OT Treatment/Interventions: Self-care/ADL training;Therapeutic exercise;Therapeutic activities;Balance training      OT Goals(Current goals can be found in the care plan section)   Acute Rehab OT Goals Patient Stated Goal: to go home OT Goal Formulation: With patient/family  Time For Goal Achievement: 03/28/24 Potential to Achieve Goals: Good ADL Goals Pt/caregiver will Perform Home Exercise Program: Left upper extremity;With written HEP provided;Increased strength;Increased ROM Additional ADL Goal #1: Pt will walk to bathroom with rollator and complete all toileting with supervision Additional ADL Goal #2: Pt will use LUE for funtional tasks as much as possible when safe without cues.   OT Frequency:  Min 2X/week    Co-evaluation              AM-PAC OT 6 Clicks  Daily Activity     Outcome Measure Help from another person eating meals?: A Little Help from another person taking care of personal grooming?: A Little Help from another person toileting, which includes using toliet, bedpan, or urinal?: A Little Help from another person bathing (including washing, rinsing, drying)?: A Lot Help from another person to put on and taking off regular upper body clothing?: A Lot Help from another person to put on and taking off regular lower body clothing?: A Lot 6 Click Score: 15   End of Session Equipment Utilized During Treatment: Rollator (4 wheels) Nurse Communication: Mobility status  Activity Tolerance: Patient tolerated treatment well Patient left: in bed;with call bell/phone within reach;with family/visitor present  OT Visit Diagnosis: Unsteadiness on feet (R26.81);Pain;Hemiplegia and hemiparesis Hemiplegia - Right/Left: Left Hemiplegia - dominant/non-dominant: Non-Dominant Hemiplegia - caused by: Unspecified Pain - part of body:  (all over)                Time: 1211-1250 OT Time Calculation (min): 39 min Charges:  OT General Charges $OT Visit: 1 Visit OT Evaluation $OT Eval Moderate Complexity: 1 Mod OT Treatments $Self Care/Home Management : 8-22 mins  Joshua Silvano Dragon 03/14/2024, 1:08 PM

## 2024-03-14 NOTE — Progress Notes (Signed)
 Echocardiogram 2D Echocardiogram has been performed.  [name] 03/14/2024, 10:00 AM

## 2024-03-14 NOTE — Progress Notes (Signed)
 STROKE TEAM PROGRESS NOTE   BRIEF HPI Mr. [name] is a 89 y.o. male with history of HTN, HLD, DM2, TIAs, OSA not on CPAP, DJD, CKD IV, chronic combined systolic and diastolic CHF, anemia, heart block s/p PPM, anxiety, depression, hypothyroidism, and vascular dementia presenting with left arm weakness and numbness.   SIGNIFICANT HOSPITAL EVENTS 10/23 - Presented with left hand weakness and numbness CT head no acute abnormality.  MRI cannot be done due to incompatible pacer INTERIM HISTORY/SUBJECTIVE Patient's daughter-in-law is at the bedside, no acute overnight events.  Patient reports improvement in hand weakness though not back to baseline.  At baseline patient walks with a walker at home and is on home oxygen  but he lives with his family who prepares his food for him and hires help for showering.  OBJECTIVE CBC    Component Value Date/Time   WBC 8.9 03/13/2024 1801   RBC 4.48 03/13/2024 1801   HGB 13.3 03/13/2024 1801   HGB 9.5 (L) 03/14/2023 1207   HCT 42.7 03/13/2024 1801   HCT 30.1 (L) 03/14/2023 1207   PLT 161 03/13/2024 1801   PLT 231 03/14/2023 1207   MCV 95.3 03/13/2024 1801   MCV 95 03/14/2023 1207   MCH 29.7 03/13/2024 1801   MCHC 31.1 03/13/2024 1801   RDW 14.5 03/13/2024 1801   RDW 13.8 03/14/2023 1207   LYMPHSABS 1.6 03/13/2024 1801   MONOABS 0.9 03/13/2024 1801   EOSABS 0.1 03/13/2024 1801   BASOSABS 0.0 03/13/2024 1801   BMET    Component Value Date/Time   NA 138 03/13/2024 2333   NA 141 08/07/2022 1441   K 4.6 03/13/2024 2333   CL 101 03/13/2024 2333   CO2 26 03/13/2024 2333   GLUCOSE 126 (H) 03/13/2024 2333   BUN 42 (H) 03/13/2024 2333   BUN 39 (H) 08/07/2022 1441   CREATININE 2.27 (H) 03/13/2024 2333   CREATININE 2.01 (H) 04/01/2015 0927   CALCIUM  9.0 03/13/2024 2333   EGFR 34 (L) 08/07/2022 1441   GFRNONAA 27 (L) 03/13/2024 2333   Lab Results  Component Value Date   HGBA1C 5.2 03/13/2024   Lab Results  Component Value Date   CHOL  126 03/13/2024   HDL 41 03/13/2024   LDLCALC 69 03/13/2024   LDLDIRECT 138 (H) 04/01/2015   TRIG 78 03/13/2024   CHOLHDL 3.1 03/13/2024   IMAGING past 24 hours CT Cervical Spine Wo Contrast Result Date: 03/13/2024 EXAM: CT CERVICAL SPINE WITHOUT CONTRAST 03/13/2024 08:16:16 PM TECHNIQUE: CT of the cervical spine was performed without the administration of intravenous contrast. Multiplanar reformatted images are provided for review. Automated exposure control, iterative reconstruction, and/or weight based adjustment of the mA/kV was utilized to reduce the radiation dose to as low as reasonably achievable. COMPARISON: Comparison with 03/04/2022. CLINICAL HISTORY: Arm weakness. Chief complaints: Left hand numbness. Neuro deficit, acute, stroke suspected. FINDINGS: CERVICAL SPINE: BONES AND ALIGNMENT: No acute fracture or traumatic malalignment. 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 No severe spinal canal or neural foramina narrowing. SOFT TISSUES: No prevertebral soft tissue swelling. IMPRESSION: 1. No acute abnormality of the cervical spine. 2. 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 Electronically signed by: Norman Gatlin MD 03/13/2024 08:24 PM EDT RP Workstation: [id number]   CT HEAD WO CONTRAST Result Date: 03/13/2024 EXAM: CT HEAD WITHOUT CONTRAST 03/13/2024 08:16:16 PM TECHNIQUE: CT of the head was performed without the administration of intravenous contrast. Automated exposure control,  iterative reconstruction, and/or weight based adjustment of the mA/kV was utilized to reduce the radiation dose to as low as reasonably achievable. COMPARISON: Comparison with 05/21/2022. CLINICAL HISTORY: Neuro deficit, acute, stroke suspected. Chief complaints; Lt Hand Numbness; CT HEAD WO CONTRAST; Neuro deficit, acute, stroke suspected; CT Cervical Spine Wo Contrast; arm weakness. FINDINGS:  BRAIN AND VENTRICLES: No acute hemorrhage. No evidence of acute infarct. No hydrocephalus. No extra-axial collection. No mass effect or midline shift. Chronic microvascular ischemia and generalized atrophy. ORBITS: No acute abnormality. SINUSES: Changes of Chronic right maxillary sinusitis. SOFT TISSUES AND SKULL: No acute soft tissue abnormality. No skull fracture. IMPRESSION: 1. No acute intracranial abnormality. Electronically signed by: Norman Gatlin MD 03/13/2024 08:21 PM EDT RP Workstation: [id number]   Vitals:   03/14/24 0040 03/14/24 0400 03/14/24 0401 03/14/24 0620  BP:   (!) 147/64 (!) 163/74  Pulse:  (!) 58 60 65  Resp:  (!) 9 14 17   Temp: 98.2 F (36.8 C)  98.8 F (37.1 C)   TempSrc: Oral  Oral   SpO2:  99% 100% 100%  Weight:      Height:       PHYSICAL EXAM General:  Alert, well-nourished, well-developed patient in no acute distress Psych:  Mood and affect appropriate for situation. Calm and cooperative with patient exam.  CV: Paced rhythm on monitor Respiratory:  Regular, unlabored respirations on [place] (baseline) GI: Abdomen soft and nontender  NEURO:  Mental Status: AA&Ox3, patient is able to give clear and coherent history Speech/Language: speech is without dysarthria or aphasia.  Naming, repetition, fluency, and comprehension intact.  Cranial Nerves:  II: PERRL. Visual fields full.  III, IV, VI: EOMI. Eyelids elevate symmetrically.  V: Sensation is intact to light touch and symmetrical to face.  VII: Face is symmetrical resting and and with movement VIII: Hearing intact to voice. IX, X: Palate elevates symmetrically. Phonation is normal.  XI: Shoulder shrug 5/5. XII: Tongue is midline without fasciculations. Motor: 5/5 strength to all muscle groups tested distally.  Left grip strength weakness, decreased fine finger movements on the left, orbits right over left. Tone: is normal and bulk is normal Sensation: Intact to light touch bilaterally. Extinction absent to  light touch to DSS.   Coordination: FTN intact bilaterally, HKS: no ataxia in BLE.No drift.  Gait: Deferred  ASSESSMENT/PLAN Acute Ischemic Infarct:  right hemisphere acute ischemic stroke determined by clinical assessment Etiology: Likely small vessel disease CT head no acute intracranial abnormality Repeat CTH at 24 hours pending for 16:00 today  Unable to obtain MRI/MRA due to PPM TCD US  pending; CTA not ordered due to impaired renal function Carotid Doppler ultrasound consistent with 1 - 39% right ICA stenosis, 40 - 59% left ICA stenosis 2D Echo pending  LDL 69 HgbA1c 5.2 VTE prophylaxis - Heparin  SQ clopidogrel  75 mg daily prior to admission, now on aspirin  81 mg daily and Brilinta (ticagrelor) 90 mg bid for 4 weeks and then asoirin alone. Therapy recommendations:  Pending Disposition: Pending  Hx of Stroke/TIA Reported TIA in 2020 with 5 TIA episodes in the past (reports only seeking care for a few of the events) 2020 event with episodes of confusion and word finding difficulty concerning for possible left hemispheric TIA Patient on clopidogrel  at baseline  Hypertension Home meds:  Toprolol-XL, Lasix  Stable Blood Pressure Goal: BP less than 220/110.   Hyperlipidemia Home meds:  rosuvastatin  40 mg PO daily, resumed in hospital LDL 69, goal < 70 Continue statin at discharge  Diabetes type  II Controlled Home meds:  Novolin  HgbA1c 5.2, goal < 7.0 CBGs SSI Recommend close follow-up with PCP for better DM control  Other Stroke Risk Factors Obesity, Body mass index is 41.16 kg/m., BMI >/= 30 associated with increased stroke risk, recommend weight loss, diet and exercise as appropriate  Congestive heart failure Obstructive sleep apnea, not on CPAP at home  Other Active Problems Vascular dementia On Namenda  CKD stage IV Chronic combined systolic and diastolic heart failure Metoprolol , furosemide  Tachybradycardia syndrome  s/p permanent pacemaker placement Not  compatible with MRI Anxiety/depression Hypothyroidism Synthroid   Hospital day # 1  Mimi Ny, AGACNP-BC [facility] Pager: [phone] I have personally obtained history,examined this patient, reviewed notes, independently viewed imaging studies, participated in medical decision making and plan of care.ROS completed by me personally and pertinent positives fully documented  I have made any additions or clarifications directly to the above note. Agree with note above.  Patient presented with left arm weakness and numbness which is improving suspect right subcortical infarct not visualized on CT scan and unable to obtain MRI due to incompatible pacer.  Recommend repeat CT scan in 24 hours.  Change Plavix  to aspirin  and Brilinta for 4 weeks followed by aspirin  alone.  Continue ongoing stroke workup.  Maintain aggressive risk factor modification.  Patient also has baseline mild vascular dementia.  Long discussion with patient and daughter-in-law at the bedside and answered questions.   I personally spent a total of 50 minutes in the care of the patient today including getting/reviewing separately obtained history, performing a medically appropriate exam/evaluation, counseling and educating, placing orders, referring and communicating with other health care professionals, documenting clinical information in the EHR, independently interpreting results, and coordinating care.        Eather Popp, MD Medical Director [facility] Pager: [phone] 03/14/2024 4:18 PM  To contact Stroke Continuity provider, please refer to [email or web address]. After hours, contact General Neurology

## 2024-03-14 NOTE — ED Notes (Signed)
Pt assisted to hospital bed for comfort.

## 2024-03-14 NOTE — Progress Notes (Signed)
 TCD  has been completed. Refer to [facility] under chart review to view preliminary results.   03/14/2024  2:18 PM [name], [name] BIRCH

## 2024-03-15 ENCOUNTER — Other Ambulatory Visit (HOSPITAL_COMMUNITY): Payer: Self-pay

## 2024-03-15 DIAGNOSIS — I639 Cerebral infarction, unspecified: Secondary | ICD-10-CM | POA: Diagnosis not present

## 2024-03-15 DIAGNOSIS — I7090 Unspecified atherosclerosis: Secondary | ICD-10-CM | POA: Diagnosis not present

## 2024-03-15 DIAGNOSIS — R2990 Unspecified symptoms and signs involving the nervous system: Secondary | ICD-10-CM | POA: Diagnosis not present

## 2024-03-15 DIAGNOSIS — E1151 Type 2 diabetes mellitus with diabetic peripheral angiopathy without gangrene: Secondary | ICD-10-CM | POA: Diagnosis not present

## 2024-03-15 DIAGNOSIS — Z794 Long term (current) use of insulin: Secondary | ICD-10-CM

## 2024-03-15 DIAGNOSIS — E1122 Type 2 diabetes mellitus with diabetic chronic kidney disease: Secondary | ICD-10-CM | POA: Diagnosis not present

## 2024-03-15 LAB — GLUCOSE, CAPILLARY
Glucose-Capillary: 160 mg/dL — ABNORMAL HIGH (ref 70–99)
Glucose-Capillary: 100 mg/dL — ABNORMAL HIGH (ref 70–99)
Glucose-Capillary: 151 mg/dL — ABNORMAL HIGH (ref 70–99)
Glucose-Capillary: 135 mg/dL — ABNORMAL HIGH (ref 70–99)

## 2024-03-15 LAB — COMPREHENSIVE METABOLIC PANEL WITH GFR
ALT: 17 U/L (ref 0–44)
AST: 26 U/L (ref 15–41)
Albumin: 3.8 g/dL (ref 3.5–5.0)
Alkaline Phosphatase: 63 U/L (ref 38–126)
Anion gap: 12 (ref 5–15)
BUN: 41 mg/dL — ABNORMAL HIGH (ref 8–23)
CO2: 26 mmol/L (ref 22–32)
Calcium: 9.3 mg/dL (ref 8.9–10.3)
Chloride: 98 mmol/L (ref 98–111)
Creatinine, Ser: 2.36 mg/dL — ABNORMAL HIGH (ref 0.61–1.24)
GFR, Estimated: 26 mL/min — ABNORMAL LOW (ref >60)
Glucose, Bld: 99 mg/dL (ref 70–99)
Potassium: 4.2 mmol/L (ref 3.5–5.1)
Sodium: 136 mmol/L (ref 135–145)
Total Bilirubin: 1 mg/dL (ref 0.0–1.2)
Total Protein: 6.8 g/dL (ref 6.5–8.1)

## 2024-03-15 LAB — CBC
HCT: 44 % (ref 39.0–52.0)
Hemoglobin: 13.9 g/dL (ref 13.0–17.0)
MCH: 29.6 pg (ref 26.0–34.0)
MCHC: 31.6 g/dL (ref 30.0–36.0)
MCV: 93.6 fL (ref 80.0–100.0)
Platelets: 163 K/uL (ref 150–400)
RBC: 4.7 MIL/uL (ref 4.22–5.81)
RDW: 14.4 % (ref 11.5–15.5)
WBC: 9.4 K/uL (ref 4.0–10.5)
nRBC: 0 % (ref 0.0–0.2)

## 2024-03-15 LAB — MAGNESIUM: Magnesium: 2.3 mg/dL (ref 1.7–2.4)

## 2024-03-15 MED ORDER — CARMEX CLASSIC LIP BALM EX OINT
TOPICAL_OINTMENT | CUTANEOUS | Status: DC | PRN
  Filled 2024-03-15: qty 10

## 2024-03-15 MED ORDER — TICAGRELOR 90 MG PO TABS
90.0000 mg | ORAL_TABLET | Freq: Two times a day (BID) | ORAL | 0 refills | Status: DC
  Filled 2024-03-15: qty 56, 28d supply, fill #0

## 2024-03-15 NOTE — Progress Notes (Signed)
 Pt TOC meds received today, 03/15/24 , and pt in patient bin for discharge tomorrow. [name] 03/15/24 6:07 PM

## 2024-03-15 NOTE — Evaluation (Signed)
 Physical Therapy Evaluation Patient Details Name: [name] MRN: [id number] DOB: Jul 08, 1931 Today's Date: 03/15/2024  History of Present Illness  Pt is a 6  male admitted with L hand weakness and numbness. Unable to do MRI due to PPM. Stroke work up.PMH: CVA, vascular dementia, CAD, PPM, CKD4, HTN, obesity, DJD, carotid artery disease.  Clinical Impression  Pt presents with decreased activity tolerance vs anticipated baseline, impaired balance, and LUE weakness/numbness distally. Pt to benefit from acute PT to address deficits. Pt ambulated min hallway distance with use of RW and supervision for safety, pt endorsing fatiguing more quickly vs normal. Pt has good family support at home, plans to d/c home and interested in [facility]. PT to progress mobility as tolerated, and will continue to follow acutely.          If plan is discharge home, recommend the following: A little help with walking and/or transfers;A little help with bathing/dressing/bathroom   Can travel by private vehicle        Equipment Recommendations None recommended by PT  Recommendations for Other Services       Functional Status Assessment Patient has had a recent decline in their functional status and demonstrates the ability to make significant improvements in function in a reasonable and predictable amount of time.     Precautions / Restrictions Precautions Precautions: Fall Recall of Precautions/Restrictions: Intact Restrictions Weight Bearing Restrictions Per Provider Order: No      Mobility  Bed Mobility Overal bed mobility: Needs Assistance             General bed mobility comments: up in chair    Transfers Overall transfer level: Needs assistance Equipment used: Rollator (4 wheels) Transfers: Sit to/from Stand Sit to Stand: Supervision           General transfer comment: slow to rise and sit, cues for sequencing task but no physical assist     Ambulation/Gait Ambulation/Gait assistance: Supervision Gait Distance (Feet): 90 Feet Assistive device: Rollator (4 wheels) Gait Pattern/deviations: Step-through pattern, Decreased stride length, Trunk flexed Gait velocity: decr     General Gait Details: for safety, good awareness of L hand weakness/numbness so very attentive to maintain L hand on rollator  Stairs            Wheelchair Mobility     Tilt Bed    Modified Rankin (Stroke Patients Only) Modified Rankin (Stroke Patients Only) Pre-Morbid Rankin Score: Moderate disability Modified Rankin: Moderate disability     Balance Overall balance assessment: Mild deficits observed, not formally tested                                           Pertinent Vitals/Pain Pain Assessment Pain Assessment: Faces Faces Pain Scale: No hurt    Home Living Family/patient expects to be discharged to:: Private residence Living Arrangements: Children Available Help at Discharge: Family;Available 24 hours/day Type of Home: House Home Access: Ramped entrance       Home Layout: Multi-level;Able to live on main level with bedroom/bathroom Home Equipment: Rollator (4 wheels);Cane - single point;Grab bars - toilet;Grab bars - tub/shower;Shower seat;BSC/3in1      Prior Function Prior Level of Function : Needs assist       Physical Assist : ADLs (physical)   ADLs (physical): Bathing;Dressing;Grooming;IADLs Mobility Comments: ambulates with rollator at all times ADLs Comments: aid comes in 3x week and he  gets shower on Thursdays.  Aid assists with bathing, donning pants, grooming.     Extremity/Trunk Assessment   Upper Extremity Assessment Upper Extremity Assessment: Defer to OT evaluation LUE Deficits / Details: WFL except in hand LUE Sensation: decreased light touch;decreased proprioception LUE Coordination: decreased fine motor    Lower Extremity Assessment Lower Extremity Assessment: Generalized  weakness (symmetric, no changes in sensation)    Cervical / Trunk Assessment Cervical / Trunk Assessment: Kyphotic  Communication   Communication Communication: Impaired Factors Affecting Communication: Hearing impaired    Cognition Arousal: Alert Behavior During Therapy: WFL for tasks assessed/performed                             Following commands: Intact       Cueing Cueing Techniques: Verbal cues     General Comments      Exercises     Assessment/Plan    PT Assessment Patient needs continued PT services  PT Problem List Decreased strength;Decreased mobility;Decreased activity tolerance;Decreased balance       PT Treatment Interventions DME instruction;Therapeutic activities;Gait training;Therapeutic exercise;Patient/family education;Balance training;Neuromuscular re-education;Functional mobility training    PT Goals (Current goals can be found in the Care Plan section)  Acute Rehab PT Goals PT Goal Formulation: With patient Time For Goal Achievement: 03/29/24 Potential to Achieve Goals: Good    Frequency Min 2X/week     Co-evaluation               AM-PAC PT 6 Clicks Mobility  Outcome Measure Help needed turning from your back to your side while in a flat bed without using bedrails?: A Little Help needed moving from lying on your back to sitting on the side of a flat bed without using bedrails?: A Little Help needed moving to and from a bed to a chair (including a wheelchair)?: A Little Help needed standing up from a chair using your arms (e.g., wheelchair or bedside chair)?: A Little Help needed to walk in hospital room?: A Little Help needed climbing 3-5 steps with a railing? : A Little 6 Click Score: 18    End of Session Equipment Utilized During Treatment: Oxygen  Activity Tolerance: Patient tolerated treatment well Patient left: in chair;with call bell/phone within reach;with family/visitor present Nurse Communication: Mobility  status PT Visit Diagnosis: Other abnormalities of gait and mobility (R26.89)    Time: 1205-1224 PT Time Calculation (min) (ACUTE ONLY): 19 min   Charges:   PT Evaluation $PT Eval Low Complexity: 1 Low   PT General Charges $$ ACUTE PT VISIT: 1 Visit         Johana RAMAN, PT DPT Acute Rehabilitation Services Secure Chat Preferred  Office [phone]   Pauleen Goleman [place] 03/15/2024, 12:35 PM

## 2024-03-15 NOTE — Progress Notes (Signed)
 Dr. Trixie requested device interrogation. Spoke with [employer] device rep - Interrogation was done on arrival to ED 03/13/24 - showed 16 sec of atrial fib back on June 17th 2025, which is the longest since December 2021, but otherwise no recent events and normal device function. Relayed update to Dr. Trixie.

## 2024-03-15 NOTE — Progress Notes (Signed)
 STROKE TEAM PROGRESS NOTE   INTERIM HISTORY/SUBJECTIVE Patient's son is at the bedside. Pt today when working with PT and OT felt worsening slurry speech, some facial droop and weakened left arm. BP was  119/62 with glucose 60 earlier. Symptoms quickly improved after put back to bed. Had unremarkable orthostatic vital later with BP 140s, and glucose also normalized at that time. On my exam, pt has only left grip weakness, no facial droop or slurry speech.   OBJECTIVE CBC    Component Value Date/Time   WBC 9.4 03/15/2024 0900   RBC 4.70 03/15/2024 0900   HGB 13.9 03/15/2024 0900   HGB 9.5 (L) 03/14/2023 1207   HCT 44.0 03/15/2024 0900   HCT 30.1 (L) 03/14/2023 1207   PLT 163 03/15/2024 0900   PLT 231 03/14/2023 1207   MCV 93.6 03/15/2024 0900   MCV 95 03/14/2023 1207   MCH 29.6 03/15/2024 0900   MCHC 31.6 03/15/2024 0900   RDW 14.4 03/15/2024 0900   RDW 13.8 03/14/2023 1207   LYMPHSABS 1.6 03/13/2024 1801   MONOABS 0.9 03/13/2024 1801   EOSABS 0.1 03/13/2024 1801   BASOSABS 0.0 03/13/2024 1801   BMET    Component Value Date/Time   NA 136 03/15/2024 0900   NA 141 08/07/2022 1441   K 4.2 03/15/2024 0900   CL 98 03/15/2024 0900   CO2 26 03/15/2024 0900   GLUCOSE 99 03/15/2024 0900   BUN 41 (H) 03/15/2024 0900   BUN 39 (H) 08/07/2022 1441   CREATININE 2.36 (H) 03/15/2024 0900   CREATININE 2.01 (H) 04/01/2015 0927   CALCIUM  9.3 03/15/2024 0900   EGFR 34 (L) 08/07/2022 1441   GFRNONAA 26 (L) 03/15/2024 0900   Lab Results  Component Value Date   HGBA1C 5.2 03/13/2024   Lab Results  Component Value Date   CHOL 126 03/13/2024   HDL 41 03/13/2024   LDLCALC 69 03/13/2024   LDLDIRECT 138 (H) 04/01/2015   TRIG 78 03/13/2024   CHOLHDL 3.1 03/13/2024   IMAGING past 24 hours No results found.  Vitals:   03/15/24 0346 03/15/24 0727 03/15/24 1113 03/15/24 1522  BP: (!) 155/72 (!) 156/62 119/62 113/87  Pulse: 65 68 73 69  Resp:  19 20   Temp: (!) 97.4 F (36.3 C) 98 F  (36.7 C) 97.8 F (36.6 C) 98.5 F (36.9 C)  TempSrc: Oral   Oral  SpO2: 100% 97% 97% 100%  Weight:      Height:       PHYSICAL EXAM General:  Alert, well-nourished, well-developed patient in no acute distress CV: Paced rhythm on monitor Respiratory:  Regular, unlabored respirations GI: Abdomen soft and nontender  NEURO:  Mental Status: AA&Ox3, patient is able to give clear and coherent history Speech/Language: speech is without dysarthria or aphasia.  Naming, repetition, fluency, and comprehension intact.  Cranial Nerves:  II: PERRL. Visual fields full.  III, IV, VI: EOMI. Eyelids elevate symmetrically.  V: Sensation is intact to light touch and symmetrical to face.  VII: Face is symmetrical resting and and with movement VIII: Hearing intact to voice. IX, X: Palate elevates symmetrically. Phonation is normal.  XI: Shoulder shrug 5/5. XII: Tongue is midline without fasciculations. Motor: 5/5 strength to all muscle groups tested distally.  Left grip strength weakness, decreased fine finger movements on the left  Tone: is normal and bulk is normal Sensation: Intact to light touch bilaterally. Extinction absent to light touch to DSS.   Coordination: FTN intact bilaterally, HKS: no  ataxia in BLE.   Gait: Deferred  ASSESSMENT/PLAN Stroke:  right hemisphere acute ischemic stroke determined by clinical assessment, etiology: Likely small vessel disease CT head no acute intracranial abnormality Repeat CTH no acute abnormality Unable to obtain MRI/MRA due to PPM TCD intracranial atherosclerosis; CTA not ordered due to impaired renal function Carotid Doppler ultrasound consistent with 40 - 59% left ICA stenosis 2D Echo EF 40 to 45% LDL 69 HgbA1c 5.2 UDS negative VTE prophylaxis - Heparin  SQ clopidogrel  75 mg daily prior to admission, now on aspirin  81 mg daily and Brilinta (ticagrelor) 90 mg bid for 4 weeks and then Plavix  alone. Therapy recommendations: Home health PT and  OT Disposition: Pending  Hx of Stroke/TIA 11/2018 admitted for left brain TIA.  CT and CT repeated no acute abnormality.  CTA head and neck left ICA 6% stenosis.  Carotid Doppler left ICA 40 to 59% stenosis.  EF normal.  LDL 74, A1c 7.1.  Discharged on DAPT and Crestor  40.  Brief episode of paroxysmal A-fib Pacemaker interrogation showed 16 sec of atrial fib back on June 17th 2025  Per Dr. Francyne note on 02/20/23, no afib found at that time.  Likely not related to current stroke Will need close monitoring and cardiology follow-up  Hypertension Home meds:  Toprolol-XL, Lasix  Stable Avoid low BP Orthostatic vital unremarkable Long term BP goal normotensive  Hyperlipidemia Home meds:  rosuvastatin  40 mg PO daily, resumed in hospital LDL 69, goal < 70 Continue statin at discharge  Diabetes type II Controlled Home meds:  Novolin  HgbA1c 5.2, goal < 7.0 CBGs SSI Recommend close follow-up with PCP for better DM control  Other Stroke Risk Factors Obesity, Body mass index is 41.16 kg/m., BMI >/= 30 associated with increased stroke risk, recommend weight loss, diet and exercise as appropriate  Chronic combined systolic and diastolic heart failure, on metoprolol , furosemide  Obstructive sleep apnea, not on CPAP at home  Other Active Problems Vascular dementia On Namenda  CKD stage IV, creatinine 2.16--2.27 Tachybradycardia syndrome  s/p permanent pacemaker placement Not compatible with MRI Anxiety/depression Hypothyroidism Synthroid   Hospital day # 2  Ary Cummins, MD PhD Stroke Neurology 03/15/2024 6:53 PM   To contact Stroke Continuity provider, please refer to [email or web address]. After hours, contact General Neurology

## 2024-03-15 NOTE — Plan of Care (Signed)

## 2024-03-15 NOTE — Progress Notes (Signed)
 PROGRESS NOTE  [name] FMW:[id number] DOB: July 21, 1931 DOA: 03/13/2024 PCP: Chet Mad, DO   LOS: 2 days   Brief Narrative / Interim history: 89 y.o. male with hx of prior CVA, vascular dementia, CAD with PCI, carotid artery disease, systolic/diastolic HF, SSS s/p dual chamber PPM ([employer]; per Cards generator is MRI conditional, his leads (SJM atrial 1022T, ventricular 1216T) are not MRI compatible), CKD4, HTN, HLD, DM2, hypothyroidism, OSA, Obesity, DJD, mood d/o, who presented with left hand weakness and numbness.  Initially he thought this was related to how he slept, but upon persistence of his symptoms decided to come to the hospital  Subjective / 24h Interval events: Doing well, feels like he is improving. He did have another episode today of slurred speech, increased left sided weakness, not resolved  Assesement and Plan: Principal problem Left hand weakness, numbness -definitely concern for CVA, he has multiple risk factors.  Neurology consulted and following, unable to obtain an MRI due to his leads not being compatible. Underwent stroke workup with 2d echo with EF ~45%, unchanged, LDL 69, on statin, A1C 5.2. PT recommends HH PT.  -due to recurrent symptoms will continue to monitor -currently on Aspirin  and Brillinta  Active problems History of CVA-noted  Vascular dementia-very mild, alert and oriented x 4.  Continue memantine   CAD, history of PCI-no chest pain, this appears stable  SSS status post pacemaker-noted, keep on telemetry  Carotid artery disease-workup per neurology  CKD stage IV -creatinine is at baseline  Hypothyroidism-continue Synthroid   Hyperlipidemia-continue statin  OSA-not on CPAP  Obesity, morbid-BMI 41  DM 2, insulin -dependent-continue insulin  as below  Lab Results  Component Value Date   HGBA1C 5.2 03/13/2024   CBG (last 3)  Recent Labs    03/15/24 0612 03/15/24 1115 03/15/24 1522  GLUCAP 160* 100* 151*     Scheduled Meds:  aspirin  EC  81 mg Oral Daily   heparin   5,000 Units Subcutaneous Q8H   insulin  aspart  0-6 Units Subcutaneous TID WC   insulin  aspart protamine- aspart  30 Units Subcutaneous Q breakfast   levothyroxine   25 mcg Oral q morning   memantine   10 mg Oral BID   pantoprazole   20 mg Oral Q0600   rosuvastatin   40 mg Oral Daily   sodium chloride  flush  3 mL Intravenous Q12H   ticagrelor  90 mg Oral BID   Continuous Infusions: PRN Meds:.acetaminophen , albuterol , lip balm, melatonin, ondansetron  (ZOFRAN ) IV, polyethylene glycol  Current Outpatient Medications  Medication Instructions   acetaminophen  (TYLENOL ) 500 mg, Oral, 2 times daily   Cholecalciferol 2,000 Units, Oral, Daily   clopidogrel  (PLAVIX ) 75 mg, Oral, Daily, Restart 10/15   Continuous Glucose Sensor (FREESTYLE LIBRE 3 SENSOR) MISC Apply topically.   COQ10 150 mg, Oral, Every evening   docusate sodium  (COLACE) 100 mg, Oral, 2 times daily   Dulcolax 5 mg, Daily PRN   furosemide  (LASIX ) 20 mg, Daily PRN   Iron  (Ferrous Sulfate ) 325 mg, Oral, Daily   levothyroxine  (SYNTHROID ) 25 mcg, Oral, Every morning   memantine  (NAMENDA ) 10 mg, Oral, 2 times daily   metoprolol  succinate (TOPROL -XL) 25 mg, Oral, Daily   Multiple Vitamin (RENAL MULTIVITAMIN/ZINC  PO) 1 tablet, Oral, Daily   nitroGLYCERIN  (NITROSTAT ) 0.4 MG SL tablet DISSOLVE ONE TABLET UNDER THE TONGUE EVERY 5 MINUTES AS NEEDED FOR CHEST PAIN.   NOVOLIN 70/30 KWIKPEN (70-30) 100 UNIT/ML KwikPen 2 times daily   OVER THE COUNTER MEDICATION 2 tablets, Oral, Every morning, Emma OTC medication  pantoprazole  (PROTONIX ) 40 mg, Oral, 2 times daily   polyethylene glycol (MIRALAX  / GLYCOLAX ) 17 g, Oral, Daily   rosuvastatin  (CRESTOR ) 40 mg, Oral, Daily    Diet Orders (From admission, onward)     Start     Ordered   03/13/24 2214  Diet heart healthy/carb modified Room service appropriate? Yes; Fluid consistency: Thin  Diet effective now       Comments: OK for  diet if passes swallow  Question Answer Comment  Diet-HS Snack? Nothing   Room service appropriate? Yes   Fluid consistency: Thin      03/13/24 2213            DVT prophylaxis: heparin  injection 5,000 Units Start: 03/14/24 0600   Lab Results  Component Value Date   PLT 163 03/15/2024      Code Status: Full Code  Family Communication: No family at bedside  Status is: Inpatient Remains inpatient appropriate because: Stroke   Level of care: Telemetry Medical  Consultants:  Neurology  Objective: Vitals:   03/15/24 0346 03/15/24 0727 03/15/24 1113 03/15/24 1522  BP: (!) 155/72 (!) 156/62 119/62 113/87  Pulse: 65 68 73 69  Resp:  19 20   Temp: (!) 97.4 F (36.3 C) 98 F (36.7 C) 97.8 F (36.6 C) 98.5 F (36.9 C)  TempSrc: Oral   Oral  SpO2: 100% 97% 97% 100%  Weight:      Height:       No intake or output data in the 24 hours ending 03/15/24 1534 Wt Readings from Last 3 Encounters:  03/13/24 115.7 kg  03/03/24 102.1 kg  10/22/23 103.9 kg    Examination: Constitutional: NAD Eyes: lids and conjunctivae normal, no scleral icterus ENMT: mmm Neck: normal, supple Respiratory: clear to auscultation bilaterally, no wheezing, no crackles.  Cardiovascular: Regular rate and rhythm, no murmurs / rubs / gallops. No LE edema. Abdomen: soft, no distention, no tenderness. Bowel sounds positive.   Data Reviewed: I have independently reviewed following labs and imaging studies   CBC Recent Labs  Lab 03/13/24 1801 03/14/24 1851 03/15/24 0900  WBC 8.9 8.6 9.4  HGB 13.3 13.6 13.9  HCT 42.7 43.4 44.0  PLT 161 171 163  MCV 95.3 93.7 93.6  MCH 29.7 29.4 29.6  MCHC 31.1 31.3 31.6  RDW 14.5 14.5 14.4  LYMPHSABS 1.6  --   --   MONOABS 0.9  --   --   EOSABS 0.1  --   --   BASOSABS 0.0  --   --     Recent Labs  Lab 03/13/24 1801 03/13/24 2058 03/13/24 2333 03/15/24 0900  NA 138  --  138 136  K 4.6  --  4.6 4.2  CL 104  --  101 98  CO2 26  --  26 26   GLUCOSE 70  --  126* 99  BUN 43*  --  42* 41*  CREATININE 2.16*  --  2.27* 2.36*  CALCIUM  9.0  --  9.0 9.3  AST 30  --   --  26  ALT 16  --   --  17  ALKPHOS 62  --   --  63  BILITOT 0.9  --   --  1.0  ALBUMIN  3.7  --   --  3.8  MG  --   --  2.2 2.3  INR  --   --  1.1  --   HGBA1C  --  5.2  --   --     ------------------------------------------------------------------------------------------------------------------  Recent Labs    03/13/24 2058  CHOL 126  HDL 41  LDLCALC 69  TRIG 78  CHOLHDL 3.1    Lab Results  Component Value Date   HGBA1C 5.2 03/13/2024   ------------------------------------------------------------------------------------------------------------------ No results for input(s): TSH, T4TOTAL, T3FREE, THYROIDAB in the last 72 hours.  Invalid input(s): FREET3  Cardiac Enzymes No results for input(s): CKMB, TROPONINI, MYOGLOBIN in the last 168 hours.  Invalid input(s): CK ------------------------------------------------------------------------------------------------------------------    Component Value Date/Time   BNP 114.7 (H) 07/09/2023 1033    CBG: Recent Labs  Lab 03/14/24 1628 03/14/24 2115 03/15/24 0612 03/15/24 1115 03/15/24 1522  GLUCAP 179* 160* 160* 100* 151*    No results found for this or any previous visit (from the past 240 hours).   Radiology Studies: CT HEAD WO CONTRAST ( ) Result Date: 03/14/2024 EXAM: CT HEAD WITHOUT CONTRAST 03/14/2024 05:18:00 PM TECHNIQUE: CT of the head was performed without the administration of intravenous contrast. Automated exposure control, iterative reconstruction, and/or weight based adjustment of the mA/kV was utilized to reduce the radiation dose to as low as reasonably achievable. COMPARISON: 03/13/2024 CLINICAL HISTORY: Timed, please do not scan prior to 10/24 at 4PM. MRI incompatible PPM lead, interval eval stroke. L hand weakness / sensory change. FINDINGS: BRAIN AND  VENTRICLES: No acute hemorrhage. No evidence of acute infarct. Moderate chronic microvascular ischemic change. Generalized volume loss without lobar predominance. No hydrocephalus. No extra-axial collection. No mass effect or midline shift. ORBITS: Bilateral lens replacement noted. SINUSES: Mucosal thickening in right maxillary sinus, likely chronic sinusitis sequela. SOFT TISSUES AND SKULL: No acute soft tissue abnormality. No skull fracture. IMPRESSION: 1. No acute intracranial abnormality. 2. Moderate chronic microvascular ischemic change and generalized volume loss without lobar predominance. Electronically signed by: Lonni Necessary MD 03/14/2024 06:02 PM EDT RP Workstation: [id number]   Nilda Fendt, MD, PhD [facility]  Between 7 am - 7 pm I am available, please contact me via Amion (for emergencies) or Securechat (non urgent messages)  Between 7 pm - 7 am I am not available, please contact night coverage MD/APP via Amion

## 2024-03-16 ENCOUNTER — Other Ambulatory Visit (HOSPITAL_COMMUNITY): Payer: Self-pay

## 2024-03-16 ENCOUNTER — Other Ambulatory Visit: Payer: Self-pay | Admitting: Cardiovascular Disease

## 2024-03-16 DIAGNOSIS — E1122 Type 2 diabetes mellitus with diabetic chronic kidney disease: Secondary | ICD-10-CM | POA: Diagnosis not present

## 2024-03-16 DIAGNOSIS — R2990 Unspecified symptoms and signs involving the nervous system: Secondary | ICD-10-CM | POA: Diagnosis not present

## 2024-03-16 DIAGNOSIS — I639 Cerebral infarction, unspecified: Secondary | ICD-10-CM | POA: Diagnosis not present

## 2024-03-16 DIAGNOSIS — I7090 Unspecified atherosclerosis: Secondary | ICD-10-CM | POA: Diagnosis not present

## 2024-03-16 DIAGNOSIS — E1151 Type 2 diabetes mellitus with diabetic peripheral angiopathy without gangrene: Secondary | ICD-10-CM | POA: Diagnosis not present

## 2024-03-16 LAB — GLUCOSE, CAPILLARY
Glucose-Capillary: 143 mg/dL — ABNORMAL HIGH (ref 70–99)
Glucose-Capillary: 176 mg/dL — ABNORMAL HIGH (ref 70–99)

## 2024-03-16 MED ORDER — TICAGRELOR 90 MG PO TABS
90.0000 mg | ORAL_TABLET | Freq: Two times a day (BID) | ORAL | 0 refills | Status: AC
  Filled 2024-03-16: qty 56, 28d supply, fill #0

## 2024-03-16 MED ORDER — ASPIRIN 81 MG PO TBEC
81.0000 mg | DELAYED_RELEASE_TABLET | Freq: Every day | ORAL | 12 refills | Status: AC
  Filled 2024-03-16: qty 30, 30d supply, fill #0

## 2024-03-16 NOTE — Progress Notes (Signed)
 DC paperwork reviewed, all questions answered.  Pt already has an appointment with neurology next Wednesday.  All belongings returned. Pt has home portable 02 for discharge.  IV and tele removed by this RN.  Discharged via WC without incident

## 2024-03-16 NOTE — Discharge Summary (Signed)
 Physician Discharge Summary  KAYDE WAREHIME FMW:[id number] DOB: 1931-08-31 DOA: 03/13/2024  PCP: Espinoza, Alejandra, DO  Admit date: 03/13/2024 Discharge date: 03/16/2024  Admitted From: home Disposition:  home  Recommendations for Outpatient Follow-up:  Follow up with PCP in 1-2 weeks Follow-up with neurology as an outpatient  Home Health: PT Equipment/Devices: none  Discharge Condition: stable CODE STATUS: Full code Diet Orders (From admission, onward)     Start     Ordered   03/13/24 2214  Diet heart healthy/carb modified Room service appropriate? Yes; Fluid consistency: Thin  Diet effective now       Comments: OK for diet if passes swallow  Question Answer Comment  Diet-HS Snack? Nothing   Room service appropriate? Yes   Fluid consistency: Thin      03/13/24 2213            Brief Narrative / Interim history: 89 y.o. male with hx of prior CVA, vascular dementia, CAD with PCI, carotid artery disease, systolic/diastolic HF, SSS s/p dual chamber PPM ([employer]; per Cards generator is MRI conditional, his leads (SJM atrial 1022T, ventricular 1216T) are not MRI compatible), CKD4, HTN, HLD, DM2, hypothyroidism, OSA, Obesity, DJD, mood d/o, who presented with left hand weakness and numbness.  Initially he thought this was related to how he slept, but upon persistence of his symptoms decided to come to the hospital  Hospital Course / Discharge diagnoses: Principal problem Left hand weakness, numbness -definitely concern for CVA, he has multiple risk factors.  Neurology consulted and following, unable to obtain an MRI due to his leads not being compatible. Underwent stroke workup with 2d echo with EF ~45%, unchanged, LDL 69, on statin, A1C 5.2. PT recommends HH PT. Patient had recurren worsening of his symptoms 10/25, but now resolved and he is close to baseline.  He will be discharged home in stable condition, will go on aspirin  and Brilinta for 4 weeks per neurology,  then Plavix  alone   Active problems History of CVA-noted Vascular dementia-very mild, alert and oriented x 4.  Continue memantine  CAD, history of PCI-no chest pain, this appears stable SSS status post pacemaker-noted, keep on telemetry.  Pacemaker interrogated, he apparently had a 16-second course of A-fib back in June, seen by cardiology as an outpatient.  No A-fib since.  Discussed with neurology, will not start anticoagulation unless A-fib recurs.  Outpatient follow-up with cardiology Carotid artery disease-workup per neurology CKD stage IV -creatinine is at baseline Hypothyroidism-continue Synthroid  Hyperlipidemia-continue statin OSA-not on CPAP Obesity, morbid-BMI 41 DM 2, insulin -dependent-continue insulin  as below  Sepsis ruled out   Discharge Instructions   Allergies as of 03/16/2024   No Known Allergies      Medication List     STOP taking these medications    clopidogrel  75 MG tablet Commonly known as: PLAVIX        TAKE these medications    acetaminophen  500 MG tablet Commonly known as: TYLENOL  Take 500 mg by mouth in the morning and at bedtime.   aspirin  EC 81 MG tablet Take 1 tablet (81 mg total) by mouth daily. Swallow whole. Start taking on: March 17, 2024   Cholecalciferol 50 MCG (2000 UT) Caps Take 2,000 Units by mouth daily.   Colace 100 MG capsule Generic drug: docusate sodium  Take 100 mg by mouth 2 (two) times daily.   COQ10 150 MG Caps Take 150 mg by mouth every evening.   Dulcolax 5 MG EC tablet Generic drug: bisacodyl  Take 5 mg by  mouth daily as needed for severe constipation.   FreeStyle Libre 3 Sensor Misc Apply topically.   furosemide  20 MG tablet Commonly known as: LASIX  Take 20 mg by mouth daily as needed.   Iron  (Ferrous Sulfate ) 325 (65 Fe) MG Tabs Take 325 mg by mouth daily.   levothyroxine  25 MCG tablet Commonly known as: SYNTHROID  Take 25 mcg by mouth every morning.   memantine  10 MG tablet Commonly known  as: NAMENDA  Take 1 tablet (10 mg total) by mouth 2 (two) times daily.   metoprolol  succinate 25 MG 24 hr tablet Commonly known as: TOPROL -XL Take 1 tablet (25 mg total) by mouth daily.   nitroGLYCERIN  0.4 MG SL tablet Commonly known as: NITROSTAT  DISSOLVE ONE TABLET UNDER THE TONGUE EVERY 5 MINUTES AS NEEDED FOR CHEST PAIN.   NovoLIN 70/30 Kwikpen (70-30) 100 UNIT/ML KwikPen Generic drug: insulin  isophane & regular human KwikPen Inject into the skin 2 (two) times daily. Takes 50 units in the morning and 13 units at night. What changed:  how much to take when to take this additional instructions   OVER THE COUNTER MEDICATION Take 2 tablets by mouth in the morning. Emma OTC medication   pantoprazole  40 MG tablet Commonly known as: PROTONIX  Take 1 tablet (40 mg total) by mouth 2 (two) times daily. What changed:  how much to take when to take this   polyethylene glycol 17 g packet Commonly known as: MIRALAX  / GLYCOLAX  Take 17 g by mouth daily.   RENAL MULTIVITAMIN/ZINC  PO Take 1 tablet by mouth daily.   rosuvastatin  40 MG tablet Commonly known as: CRESTOR  Take 1 tablet by mouth once daily   ticagrelor 90 MG Tabs tablet Commonly known as: BRILINTA Take 1 tablet (90 mg total) by mouth 2 (two) times daily for 28 days.       Consultations: Neurology  Procedures/Studies:  ECHOCARDIOGRAM COMPLETE Result Date: 03/14/2024    ECHOCARDIOGRAM REPORT   Patient Name:   NEEV MCMAINS Date of Exam: 03/14/2024 Medical Rec #:  [id number]      Height:       66.0 in Accession #:    [id number]     Weight:       255.0 lb Date of Birth:  1932-04-10      BSA:          2.217 m Patient Age:    89 years       BP:           163/74 mmHg Patient Gender: M              HR:           113 bpm. Exam Location:  Inpatient Procedure: 2D Echo, Cardiac Doppler and Color Doppler (Both Spectral and Color            Flow Doppler were utilized during procedure). Indications:    Stroke I63.9  History:         Patient has prior history of Echocardiogram examinations, most                 recent 05/02/2021. Cardiomyopathy and CHF, CAD, Pacemaker,                 Stroke, TIA and CKD, stage 3; Risk Factors:Sleep Apnea, Diabetes                 and Dyslipidemia.  Sonographer:    Thea Norlander RCS Referring Phys: [facility]  Sonographer Comments: Suboptimal parasternal window,  suboptimal apical window and suboptimal subcostal window. IMPRESSIONS  1. Left ventricular ejection fraction, by estimation, is 40 to 45%. The left ventricle has mildly decreased function. The left ventricle has no regional wall motion abnormalities. There is mild concentric left ventricular hypertrophy. Left ventricular diastolic parameters are consistent with Grade I diastolic dysfunction (impaired relaxation).  2. Right ventricular systolic function is normal. The right ventricular size is normal.  3. The mitral valve is normal in structure. No evidence of mitral valve regurgitation. No evidence of mitral stenosis.  4. The aortic valve is tricuspid. There is mild calcification of the aortic valve. Aortic valve regurgitation is not visualized. Aortic valve sclerosis/calcification is present, without any evidence of aortic stenosis. Conclusion(s)/Recommendation(s): Technically difficult study due to poor sound wave transmission. FINDINGS  Left Ventricle: Left ventricular ejection fraction, by estimation, is 40 to 45%. The left ventricle has mildly decreased function. The left ventricle has no regional wall motion abnormalities. The left ventricular internal cavity size was normal in size. There is mild concentric left ventricular hypertrophy. Abnormal (paradoxical) septal motion, consistent with left bundle branch block. Left ventricular diastolic parameters are consistent with Grade I diastolic dysfunction (impaired relaxation). Right Ventricle: The right ventricular size is normal. No increase in right ventricular wall thickness. Right  ventricular systolic function is normal. Left Atrium: Left atrial size was normal in size. Right Atrium: Right atrial size was normal in size. Pericardium: There is no evidence of pericardial effusion. Mitral Valve: The mitral valve is normal in structure. Mild mitral annular calcification. No evidence of mitral valve regurgitation. No evidence of mitral valve stenosis. Tricuspid Valve: The tricuspid valve is normal in structure. Tricuspid valve regurgitation is mild . No evidence of tricuspid stenosis. Aortic Valve: The aortic valve is tricuspid. There is mild calcification of the aortic valve. Aortic valve regurgitation is not visualized. Aortic valve sclerosis/calcification is present, without any evidence of aortic stenosis. Aortic valve peak gradient measures 6.0 mmHg. Pulmonic Valve: The pulmonic valve was normal in structure. Pulmonic valve regurgitation is not visualized. No evidence of pulmonic stenosis. Aorta: The aortic root is normal in size and structure. Venous: The inferior vena cava was not well visualized. IAS/Shunts: No atrial level shunt detected by color flow Doppler.  LEFT VENTRICLE PLAX 2D LVIDd:         4.10 cm   Diastology LVIDs:         3.10 cm   LV e' medial:    3.70 cm/s LV PW:         1.20 cm   LV E/e' medial:  16.9 LV IVS:        1.30 cm   LV e' lateral:   6.53 cm/s LVOT diam:     2.10 cm   LV E/e' lateral: 9.6 LV SV:         58 LV SV Index:   26 LVOT Area:     3.46 cm  RIGHT VENTRICLE RV S prime:     12.80 cm/s TAPSE (M-mode): 2.1 cm LEFT ATRIUM             Index        RIGHT ATRIUM           Index LA diam:        4.10 cm 1.85 cm/m   RA Area:     13.90 cm LA Vol (A2C):   23.7 ml 10.69 ml/m  RA Volume:   28.80 ml  12.99 ml/m LA Vol (A4C):   47.6  ml 21.47 ml/m LA Biplane Vol: 36.8 ml 16.60 ml/m  AORTIC VALVE AV Area (Vmax): 2.34 cm AV Vmax:        122.00 cm/s AV Peak Grad:   6.0 mmHg LVOT Vmax:      82.30 cm/s LVOT Vmean:     56.600 cm/s LVOT VTI:       0.167 m  AORTA Ao Root  diam: 3.70 cm Ao Asc diam:  3.50 cm MITRAL VALVE MV Area (PHT): 2.80 cm    SHUNTS MV Decel Time: 271 msec    Systemic VTI:  0.17 m MV E velocity: 62.40 cm/s  Systemic Diam: 2.10 cm MV A velocity: 86.50 cm/s MV E/A ratio:  0.72 Toribio Fuel MD Electronically signed by Toribio Fuel MD Signature Date/Time: 03/14/2024/6:13:10 PM    Final    CT HEAD WO CONTRAST ( ) Result Date: 03/14/2024 EXAM: CT HEAD WITHOUT CONTRAST 03/14/2024 05:18:00 PM TECHNIQUE: CT of the head was performed without the administration of intravenous contrast. Automated exposure control, iterative reconstruction, and/or weight based adjustment of the mA/kV was utilized to reduce the radiation dose to as low as reasonably achievable. COMPARISON: 03/13/2024 CLINICAL HISTORY: Timed, please do not scan prior to 10/24 at 4PM. MRI incompatible PPM lead, interval eval stroke. L hand weakness / sensory change. FINDINGS: BRAIN AND VENTRICLES: No acute hemorrhage. No evidence of acute infarct. Moderate chronic microvascular ischemic change. Generalized volume loss without lobar predominance. No hydrocephalus. No extra-axial collection. No mass effect or midline shift. ORBITS: Bilateral lens replacement noted. SINUSES: Mucosal thickening in right maxillary sinus, likely chronic sinusitis sequela. SOFT TISSUES AND SKULL: No acute soft tissue abnormality. No skull fracture. IMPRESSION: 1. No acute intracranial abnormality. 2. Moderate chronic microvascular ischemic change and generalized volume loss without lobar predominance. Electronically signed by: Lonni Necessary MD 03/14/2024 06:02 PM EDT RP Workstation: [id number]   VAS US  TRANSCRANIAL DOPPLER Result Date: 03/14/2024  Transcranial Doppler Patient Name:  JERAMIAH MCCAUGHEY  Date of Exam:   03/14/2024 Medical Rec #: [id number]       Accession #:    [id number] Date of Birth: [birth year]       Patient Gender: M Patient Age:   17 years Exam Location:  [facility] Procedure:      VAS US   TRANSCRANIAL DOPPLER Referring Phys: MIMI [place] --------------------------------------------------------------------------------  Indications: Stroke. History: Hypertension, hyperlipidemia, diabetes, CKD4. Performing Technologist: [name] Sturdivant-Jones RDMS, RVT  Examination Guidelines: A complete evaluation includes B-mode imaging, spectral Doppler, color Doppler, and power Doppler as needed of all accessible portions of each vessel. Bilateral testing is considered an integral part of a complete examination. Limited examinations for reoccurring indications may be performed as noted.  +----------+---------------+----------+-----------+-------------+ RIGHT TCD Right VM (cm/s)Depth (cm)Pulsatility   Comment    +----------+---------------+----------+-----------+-------------+ MCA             32                    1.42                  +----------+---------------+----------+-----------+-------------+ ACA             -16                   1.50                  +----------+---------------+----------+-----------+-------------+ Term ICA        33  1.46                  +----------+---------------+----------+-----------+-------------+ PCA P1          18                    1.05                  +----------+---------------+----------+-----------+-------------+ Opthalmic       18                    1.04                  +----------+---------------+----------+-----------+-------------+ ICA siphon                                    not insonated +----------+---------------+----------+-----------+-------------+ Vertebral       -16                   0.99                  +----------+---------------+----------+-----------+-------------+  +----------+--------------+----------+-----------+-------------+ LEFT TCD  Left VM (cm/s)Depth (cm)Pulsatility   Comment    +----------+--------------+----------+-----------+-------------+ MCA             25                    1.41                  +----------+--------------+----------+-----------+-------------+ ACA            -25                   1.42                  +----------+--------------+----------+-----------+-------------+ Term ICA        24                   1.59                  +----------+--------------+----------+-----------+-------------+ PCA P1          13                   1.08                  +----------+--------------+----------+-----------+-------------+ Opthalmic       28                   1.65                  +----------+--------------+----------+-----------+-------------+ ICA siphon                                   not insonated +----------+--------------+----------+-----------+-------------+ Vertebral      -16                   1.04                  +----------+--------------+----------+-----------+-------------+  +------------+-------+-------+             VM cm/sComment +------------+-------+-------+ Prox Basilar  -18          +------------+-------+-------+ Dist Basilar  -24          +------------+-------+-------+ Summary:  Low normal mean flow velocities in majority of the vessels of  anetrior and posterior cerebral circulations with elevated pulsatility indices suggest diffuse intracranial atherosclerosis likely. *See table(s) above for TCD measurements and observations.  Diagnosing physician: Eather Popp MD Electronically signed by Eather Popp MD on 03/14/2024 at 5:42:32 PM.    Final    VAS US  CAROTID Result Date: 03/14/2024 Carotid Arterial Duplex Study Patient Name:  DAVIDLEE JEANBAPTISTE  Date of Exam:   03/14/2024 Medical Rec #: [id number]       Accession #:    [id number] Date of Birth: Mar 09, 1932       Patient Gender: M Patient Age:   63 years Exam Location:  [facility] Procedure:      VAS US  CAROTID Referring Phys: DORN DAWSON --------------------------------------------------------------------------------  Indications:       CVA,  Carotid artery disease and left hand weakness and                    hypoesthesia. Risk Factors:      Hypertension, hyperlipidemia, Diabetes. Other Factors:     CKD4. Comparison Study:  03/05/22 - Bilateral 1-39% ICA stenosis Performing Technologist: [name] Sturdivant-Jones RDMS, RVT  Examination Guidelines: A complete evaluation includes B-mode imaging, spectral Doppler, color Doppler, and power Doppler as needed of all accessible portions of each vessel. Bilateral testing is considered an integral part of a complete examination. Limited examinations for reoccurring indications may be performed as noted.  Right Carotid Findings: +----------+--------+--------+--------+------------------+--------+           PSV cm/sEDV cm/sStenosisPlaque DescriptionComments +----------+--------+--------+--------+------------------+--------+ CCA Prox  82      17                                         +----------+--------+--------+--------+------------------+--------+ CCA Distal79      13                                         +----------+--------+--------+--------+------------------+--------+ ICA Prox  136     23      1-39%   calcific and focal         +----------+--------+--------+--------+------------------+--------+ ICA Mid   82      20                                         +----------+--------+--------+--------+------------------+--------+ ICA Distal81      20                                         +----------+--------+--------+--------+------------------+--------+ ECA       97      19                                         +----------+--------+--------+--------+------------------+--------+ +----------+--------+-------+----------------+-------------------+           PSV cm/sEDV cmsDescribe        Arm Pressure (mmHG) +----------+--------+-------+----------------+-------------------+ Subclavian101            Multiphasic, WNL                     +----------+--------+-------+----------------+-------------------+ +---------+--------+--+--------+--+---------+  VertebralPSV cm/s37EDV cm/s12Antegrade +---------+--------+--+--------+--+---------+  Left Carotid Findings: +----------+--------+--------+--------+------------------+--------+           PSV cm/sEDV cm/sStenosisPlaque DescriptionComments +----------+--------+--------+--------+------------------+--------+ CCA Prox  82      15                                         +----------+--------+--------+--------+------------------+--------+ CCA Distal56      13                                         +----------+--------+--------+--------+------------------+--------+ ICA Prox  176     46      40-59%  calcific                   +----------+--------+--------+--------+------------------+--------+ ICA Mid   126     31                                         +----------+--------+--------+--------+------------------+--------+ ICA Distal107     32                                         +----------+--------+--------+--------+------------------+--------+ ECA       116                                                +----------+--------+--------+--------+------------------+--------+ +----------+--------+--------+----------------+-------------------+           PSV cm/sEDV cm/sDescribe        Arm Pressure (mmHG) +----------+--------+--------+----------------+-------------------+ Subclavian129             Multiphasic, WNL                    +----------+--------+--------+----------------+-------------------+ +---------+--------+--+--------+--+---------+ VertebralPSV cm/s30EDV cm/s10Antegrade +---------+--------+--+--------+--+---------+   Summary: Right Carotid: Velocities in the right ICA are consistent with a 1-39% stenosis. Left Carotid: Velocities in the left ICA are consistent with a 40-59% stenosis. Vertebrals:  Bilateral vertebral arteries  demonstrate antegrade flow. Subclavians: Normal flow hemodynamics were seen in bilateral subclavian              arteries. *See table(s) above for measurements and observations.  Electronically signed by Eather Popp MD on 03/14/2024 at 5:41:12 PM.    Final    CT Cervical Spine Wo Contrast Result Date: 03/13/2024 EXAM: CT CERVICAL SPINE WITHOUT CONTRAST 03/13/2024 08:16:16 PM TECHNIQUE: CT of the cervical spine was performed without the administration of intravenous contrast. Multiplanar reformatted images are provided for review. Automated exposure control, iterative reconstruction, and/or weight based adjustment of the mA/kV was utilized to reduce the radiation dose to as low as reasonably achievable. COMPARISON: Comparison with 03/04/2022. CLINICAL HISTORY: Arm weakness. Chief complaints: Left hand numbness. Neuro deficit, acute, stroke suspected. FINDINGS: CERVICAL SPINE: BONES AND ALIGNMENT: No acute fracture or traumatic malalignment. 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 No severe spinal canal or neural foramina narrowing. SOFT TISSUES: No prevertebral soft tissue swelling. IMPRESSION: 1. No acute abnormality of the cervical spine. 2. Fusion of anterior osteophytes throughout  the cervical spine without severe spinal canal or neuroforamen narrowing. Electronically signed by: Norman Gatlin MD 03/13/2024 08:24 PM EDT RP Workstation: [id number]   CT HEAD WO CONTRAST Result Date: 03/13/2024 EXAM: CT HEAD WITHOUT CONTRAST 03/13/2024 08:16:16 PM TECHNIQUE: CT of the head was performed without the administration of intravenous contrast. Automated exposure control, iterative reconstruction, and/or weight based adjustment of the mA/kV was utilized to reduce the radiation dose to as low as reasonably achievable. COMPARISON: Comparison with 05/21/2022. CLINICAL HISTORY: Neuro deficit, acute, stroke suspected. Chief complaints; Lt Hand Numbness; CT HEAD WO CONTRAST;  Neuro deficit, acute, stroke suspected; CT Cervical Spine Wo Contrast; arm weakness. FINDINGS: BRAIN AND VENTRICLES: No acute hemorrhage. No evidence of acute infarct. No hydrocephalus. No extra-axial collection. No mass effect or midline shift. Chronic microvascular ischemia and generalized atrophy. ORBITS: No acute abnormality. SINUSES: Changes of Chronic right maxillary sinusitis. SOFT TISSUES AND SKULL: No acute soft tissue abnormality. No skull fracture. IMPRESSION: 1. No acute intracranial abnormality. Electronically signed by: Norman Gatlin MD 03/13/2024 08:21 PM EDT RP Workstation: [id number]     Subjective: - no chest pain, shortness of breath, no abdominal pain, nausea or vomiting.   Discharge Exam: BP 120/66 (BP Location: Left Arm)   Pulse 87   Temp 98.8 F (37.1 C) (Oral)   Resp 18   Ht 5' 6 (1.676 m)   Wt 115.7 kg   SpO2 98%   BMI 41.16 kg/m   General: Pt is alert, awake, not in acute distress Cardiovascular: RRR, S1/S2 +, no rubs, no gallops Respiratory: CTA bilaterally, no wheezing, no rhonchi Abdominal: Soft, NT, ND, bowel sounds + Extremities: no edema, no cyanosis    The results of significant diagnostics from this hospitalization (including imaging, microbiology, ancillary and laboratory) are listed below for reference.     Microbiology: No results found for this or any previous visit (from the past 240 hours).   Labs: Basic Metabolic Panel: Recent Labs  Lab 03/13/24 1801 03/13/24 2333 03/15/24 0900  NA 138 138 136  K 4.6 4.6 4.2  CL 104 101 98  CO2 26 26 26   GLUCOSE 70 126* 99  BUN 43* 42* 41*  CREATININE 2.16* 2.27* 2.36*  CALCIUM  9.0 9.0 9.3  MG  --  2.2 2.3  PHOS  --  3.7  --    Liver Function Tests: Recent Labs  Lab 03/13/24 1801 03/15/24 0900  AST 30 26  ALT 16 17  ALKPHOS 62 63  BILITOT 0.9 1.0  PROT 6.8 6.8  ALBUMIN  3.7 3.8   CBC: Recent Labs  Lab 03/13/24 1801 03/14/24 1851 03/15/24 0900  WBC 8.9 8.6 9.4  NEUTROABS  6.3  --   --   HGB 13.3 13.6 13.9  HCT 42.7 43.4 44.0  MCV 95.3 93.7 93.6  PLT 161 171 163   CBG: Recent Labs  Lab 03/15/24 1115 03/15/24 1522 03/15/24 2103 03/16/24 0652 03/16/24 0811  GLUCAP 100* 151* 135* 143* 176*   Hgb A1c Recent Labs    03/13/24 2058  HGBA1C 5.2   Lipid Profile Recent Labs    03/13/24 2058  CHOL 126  HDL 41  LDLCALC 69  TRIG 78  CHOLHDL 3.1   Thyroid  function studies No results for input(s): TSH, T4TOTAL, T3FREE, THYROIDAB in the last 72 hours.  Invalid input(s): FREET3 Urinalysis    Component Value Date/Time   COLORURINE YELLOW 07/06/2023 1601   APPEARANCEUR HAZY (A) 07/06/2023 1601   LABSPEC 1.014 07/06/2023 1601   PHURINE 5.0 07/06/2023  1601   GLUCOSEU NEGATIVE 07/06/2023 1601   HGBUR NEGATIVE 07/06/2023 1601   BILIRUBINUR NEGATIVE 07/06/2023 1601   KETONESUR NEGATIVE 07/06/2023 1601   PROTEINUR NEGATIVE 07/06/2023 1601   UROBILINOGEN 0.2 06/29/2010 0930   NITRITE NEGATIVE 07/06/2023 1601   LEUKOCYTESUR NEGATIVE 07/06/2023 1601    FURTHER DISCHARGE INSTRUCTIONS:   Get Medicines reviewed and adjusted: Please take all your medications with you for your next visit with your Primary MD   Laboratory/radiological data: Please request your Primary MD to go over all hospital tests and procedure/radiological results at the follow up, please ask your Primary MD to get all Hospital records sent to his/her office.   In some cases, they will be blood work, cultures and biopsy results pending at the time of your discharge. Please request that your primary care M.D. goes through all the records of your hospital data and follows up on these results.   Also Note the following: If you experience worsening of your admission symptoms, develop shortness of breath, life threatening emergency, suicidal or homicidal thoughts you must seek medical attention immediately by calling 911 or calling your MD immediately  if symptoms less severe.    You must read complete instructions/literature along with all the possible adverse reactions/side effects for all the Medicines you take and that have been prescribed to you. Take any new Medicines after you have completely understood and accpet all the possible adverse reactions/side effects.    Do not drive when taking Pain medications or sleeping medications (Benzodaizepines)   Do not take more than prescribed Pain, Sleep and Anxiety Medications. It is not advisable to combine anxiety,sleep and pain medications without talking with your primary care practitioner   Special Instructions: If you have smoked or chewed Tobacco  in the last 2 yrs please stop smoking, stop any regular Alcohol   and or any Recreational drug use.   Wear Seat belts while driving.   Please note: You were cared for by a hospitalist during your hospital stay. Once you are discharged, your primary care physician will handle any further medical issues. Please note that NO REFILLS for any discharge medications will be authorized once you are discharged, as it is imperative that you return to your primary care physician (or establish a relationship with a primary care physician if you do not have one) for your post hospital discharge needs so that they can reassess your need for medications and monitor your lab values.  Time coordinating discharge: 35 minutes  SIGNED:  Nilda Fendt, MD, PhD 03/16/2024, 10:37 AM

## 2024-03-16 NOTE — Progress Notes (Signed)
 RNCM met with patient and daughter-in-law at [facility] to discuss [facility].  RNCM received HH orders for PT/OT/RN.  Patient would like to use [facility] again as has used them in the past.  Joane at [facility] contacted with order and confirmation received.  Patient in agreement to services.

## 2024-03-16 NOTE — Discharge Instructions (Signed)
 Neurology recommends that you will take aspirin  and Brilinta for the next 4 weeks.  Following that, you will reverse back to Plavix  per neurology recommendations  Follow with Cross, Alejandra, DO in 5-7 days  Please get a complete blood count and chemistry panel checked by your Primary MD at your next visit, and again as instructed by your Primary MD. Please get your medications reviewed and adjusted by your Primary MD.  Please request your Primary MD to go over all Hospital Tests and Procedure/Radiological results at the follow up, please get all Hospital records sent to your Prim MD by signing hospital release before you go home.  In some cases, there will be blood work, cultures and biopsy results pending at the time of your discharge. Please request that your primary care M.D. goes through all the records of your hospital data and follows up on these results.  If you had Pneumonia of Lung problems at the Hospital: Please get a 2 view Chest X ray done in 6-8 weeks after hospital discharge or sooner if instructed by your Primary MD.  If you have Congestive Heart Failure: Please call your Cardiologist or Primary MD anytime you have any of the following symptoms:  1) 3 pound weight gain in 24 hours or 5 pounds in 1 week  2) shortness of breath, with or without a dry hacking cough  3) swelling in the hands, feet or stomach  4) if you have to sleep on extra pillows at night in order to breathe  Follow cardiac low salt diet and 1.5 lit/day fluid restriction.  If you have diabetes Accuchecks 4 times/day, Once in AM empty stomach and then before each meal. Log in all results and show them to your primary doctor at your next visit. If any glucose reading is under 80 or above 300 call your primary MD immediately.  If you have Seizure/Convulsions/Epilepsy: Please do not drive, operate heavy machinery, participate in activities at heights or participate in high speed sports until you have seen  by Primary MD or a Neurologist and advised to do so again. Per [place] statutes, patients with seizures are not allowed to drive until they have been seizure-free for six months.  Use caution when using heavy equipment or power tools. Avoid working on ladders or at heights. Take showers instead of baths. Ensure the water temperature is not too high on the home water heater. Do not go swimming alone. Do not lock yourself in a room alone (i.e. bathroom). When caring for infants or small children, sit down when holding, feeding, or changing them to minimize risk of injury to the child in the event you have a seizure. Maintain good sleep hygiene. Avoid alcohol .   If you had Gastrointestinal Bleeding: Please ask your Primary MD to check a complete blood count within one week of discharge or at your next visit. Your endoscopic/colonoscopic biopsies that are pending at the time of discharge, will also need to followed by your Primary MD.  Get Medicines reviewed and adjusted. Please take all your medications with you for your next visit with your Primary MD  Please request your Primary MD to go over all hospital tests and procedure/radiological results at the follow up, please ask your Primary MD to get all Hospital records sent to his/her office.  If you experience worsening of your admission symptoms, develop shortness of breath, life threatening emergency, suicidal or homicidal thoughts you must seek medical attention immediately by calling 911 or calling your  MD immediately  if symptoms less severe.  You must read complete instructions/literature along with all the possible adverse reactions/side effects for all the Medicines you take and that have been prescribed to you. Take any new Medicines after you have completely understood and accpet all the possible adverse reactions/side effects.   Do not drive or operate heavy machinery when taking Pain medications.   Do not take more than  prescribed Pain, Sleep and Anxiety Medications  Special Instructions: If you have smoked or chewed Tobacco  in the last 2 yrs please stop smoking, stop any regular Alcohol   and or any Recreational drug use.  Wear Seat belts while driving.  Please note You were cared for by a hospitalist during your hospital stay. If you have any questions about your discharge medications or the care you received while you were in the hospital after you are discharged, you can call the unit and asked to speak with the hospitalist on call if the hospitalist that took care of you is not available. Once you are discharged, your primary care physician will handle any further medical issues. Please note that NO REFILLS for any discharge medications will be authorized once you are discharged, as it is imperative that you return to your primary care physician (or establish a relationship with a primary care physician if you do not have one) for your aftercare needs so that they can reassess your need for medications and monitor your lab values.  You can reach the hospitalist office at phone [phone] or fax [phone]   If you do not have a primary care physician, you can call [phone] for a physician referral.  Activity: As tolerated with Full fall precautions use walker/cane & assistance as needed    Diet: heart healthy  Disposition Home

## 2024-03-16 NOTE — Progress Notes (Signed)
 STROKE TEAM PROGRESS NOTE   INTERIM HISTORY/SUBJECTIVE No acute event overnight. Pt neuro stable and improved. Orthostatic vital yesterday unremarkable.   OBJECTIVE CBC    Component Value Date/Time   WBC 9.4 03/15/2024 0900   RBC 4.70 03/15/2024 0900   HGB 13.9 03/15/2024 0900   HGB 9.5 (L) 03/14/2023 1207   HCT 44.0 03/15/2024 0900   HCT 30.1 (L) 03/14/2023 1207   PLT 163 03/15/2024 0900   PLT 231 03/14/2023 1207   MCV 93.6 03/15/2024 0900   MCV 95 03/14/2023 1207   MCH 29.6 03/15/2024 0900   MCHC 31.6 03/15/2024 0900   RDW 14.4 03/15/2024 0900   RDW 13.8 03/14/2023 1207   LYMPHSABS 1.6 03/13/2024 1801   MONOABS 0.9 03/13/2024 1801   EOSABS 0.1 03/13/2024 1801   BASOSABS 0.0 03/13/2024 1801   BMET    Component Value Date/Time   NA 136 03/15/2024 0900   NA 141 08/07/2022 1441   K 4.2 03/15/2024 0900   CL 98 03/15/2024 0900   CO2 26 03/15/2024 0900   GLUCOSE 99 03/15/2024 0900   BUN 41 (H) 03/15/2024 0900   BUN 39 (H) 08/07/2022 1441   CREATININE 2.36 (H) 03/15/2024 0900   CREATININE 2.01 (H) 04/01/2015 0927   CALCIUM  9.3 03/15/2024 0900   EGFR 34 (L) 08/07/2022 1441   GFRNONAA 26 (L) 03/15/2024 0900   Lab Results  Component Value Date   HGBA1C 5.2 03/13/2024   Lab Results  Component Value Date   CHOL 126 03/13/2024   HDL 41 03/13/2024   LDLCALC 69 03/13/2024   LDLDIRECT 138 (H) 04/01/2015   TRIG 78 03/13/2024   CHOLHDL 3.1 03/13/2024   IMAGING past 24 hours No results found.  Vitals:   03/15/24 2039 03/16/24 0027 03/16/24 0513 03/16/24 0812  BP: (!) 161/71 (!) 130/58 101/86 120/66  Pulse: 68 76 85 87  Resp: 18   18  Temp: 98.4 F (36.9 C)  97.9 F (36.6 C) 98.8 F (37.1 C)  TempSrc: Oral  Oral Oral  SpO2: 97% 95%  98%  Weight:      Height:       PHYSICAL EXAM General:  Alert, well-nourished, well-developed patient in no acute distress CV: Paced rhythm on monitor Respiratory:  Regular, unlabored respirations GI: Abdomen soft and  nontender  NEURO:  Mental Status: AA&Ox3, patient is able to give clear and coherent history Speech/Language: speech is without dysarthria or aphasia.  Naming, repetition, fluency, and comprehension intact.  Cranial Nerves:  II: PERRL. Visual fields full.  III, IV, VI: EOMI. Eyelids elevate symmetrically.  V: Sensation is intact to light touch and symmetrical to face.  VII: Face is symmetrical resting and and with movement VIII: Hearing intact to voice. IX, X: Palate elevates symmetrically. Phonation is normal.  XI: Shoulder shrug 5/5. XII: Tongue is midline without fasciculations. Motor: 5/5 strength to all muscle groups tested distally.  Left grip strength weakness, decreased fine finger movements on the left  Tone: is normal and bulk is normal Sensation: Intact to light touch bilaterally. Extinction absent to light touch to DSS.   Coordination: FTN intact bilaterally, HKS: no ataxia in BLE.   Gait: Deferred  ASSESSMENT/PLAN Stroke:  right hemisphere acute ischemic stroke determined by clinical assessment, etiology: Likely small vessel disease CT head no acute intracranial abnormality Repeat CTH no acute abnormality Unable to obtain MRI/MRA due to PPM TCD intracranial atherosclerosis; CTA not ordered due to impaired renal function Carotid Doppler ultrasound consistent with 40 - 59%  left ICA stenosis 2D Echo EF 40 to 45% LDL 69 HgbA1c 5.2 UDS negative VTE prophylaxis - Heparin  SQ clopidogrel  75 mg daily prior to admission, now on aspirin  81 mg daily and Brilinta (ticagrelor) 90 mg bid for 4 weeks and then Plavix  alone. Therapy recommendations: Home health PT and OT Disposition: Pending  Hx of Stroke/TIA 11/2018 admitted for left brain TIA.  CT and CT repeated no acute abnormality.  CTA head and neck left ICA 6% stenosis.  Carotid Doppler left ICA 40 to 59% stenosis.  EF normal.  LDL 74, A1c 7.1.  Discharged on DAPT and Crestor  40.  Brief episode of paroxysmal A-fib Pacemaker  interrogation showed 16 sec of atrial fib back on June 17th 2025  Per Dr. Francyne note on 02/20/23, no afib found at that time.  Likely not related to current stroke Will need close monitoring and cardiology follow-up  Hypertension Home meds:  Toprolol-XL, Lasix  Stable Avoid low BP Orthostatic vital unremarkable Long term BP goal normotensive  Hyperlipidemia Home meds:  rosuvastatin  40 mg PO daily, resumed in hospital LDL 69, goal < 70 Continue statin at discharge  Diabetes type II Controlled Home meds:  Novolin  HgbA1c 5.2, goal < 7.0 CBGs SSI Recommend close follow-up with PCP for better DM control  Other Stroke Risk Factors Obesity, Body mass index is 41.16 kg/m., BMI >/= 30 associated with increased stroke risk, recommend weight loss, diet and exercise as appropriate  Chronic combined systolic and diastolic heart failure, on metoprolol , furosemide  Obstructive sleep apnea, not on CPAP at home  Other Active Problems Vascular dementia On Namenda  CKD stage IV, creatinine 2.16--2.27--2.36 Tachybradycardia syndrome  s/p permanent pacemaker placement Not compatible with MRI Anxiety/depression Hypothyroidism Synthroid   Hospital day # 3  Neurology will sign off. Please call with questions. Pt will follow up with [facility] NP at [facility] in about 4 weeks. Thanks for the consult.   William Cummins, MD PhD Stroke Neurology 03/16/2024 12:31 PM   To contact Stroke Continuity provider, please refer to [email or web address]. After hours, contact General Neurology

## 2024-03-18 DIAGNOSIS — N184 Chronic kidney disease, stage 4 (severe): Secondary | ICD-10-CM | POA: Diagnosis not present

## 2024-03-18 DIAGNOSIS — I130 Hypertensive heart and chronic kidney disease with heart failure and stage 1 through stage 4 chronic kidney disease, or unspecified chronic kidney disease: Secondary | ICD-10-CM | POA: Diagnosis not present

## 2024-03-18 DIAGNOSIS — E1122 Type 2 diabetes mellitus with diabetic chronic kidney disease: Secondary | ICD-10-CM | POA: Diagnosis not present

## 2024-03-18 DIAGNOSIS — F01A18 Vascular dementia, mild, with other behavioral disturbance: Secondary | ICD-10-CM | POA: Diagnosis not present

## 2024-03-18 DIAGNOSIS — I69398 Other sequelae of cerebral infarction: Secondary | ICD-10-CM | POA: Diagnosis not present

## 2024-03-18 DIAGNOSIS — S80812D Abrasion, left lower leg, subsequent encounter: Secondary | ICD-10-CM | POA: Diagnosis not present

## 2024-03-18 DIAGNOSIS — I5042 Chronic combined systolic (congestive) and diastolic (congestive) heart failure: Secondary | ICD-10-CM | POA: Diagnosis not present

## 2024-03-18 DIAGNOSIS — F39 Unspecified mood [affective] disorder: Secondary | ICD-10-CM | POA: Diagnosis not present

## 2024-03-18 DIAGNOSIS — I495 Sick sinus syndrome: Secondary | ICD-10-CM | POA: Diagnosis not present

## 2024-03-18 DIAGNOSIS — F01A3 Vascular dementia, mild, with mood disturbance: Secondary | ICD-10-CM | POA: Diagnosis not present

## 2024-03-18 DIAGNOSIS — G4733 Obstructive sleep apnea (adult) (pediatric): Secondary | ICD-10-CM | POA: Diagnosis not present

## 2024-03-18 DIAGNOSIS — R29898 Other symptoms and signs involving the musculoskeletal system: Secondary | ICD-10-CM | POA: Diagnosis not present

## 2024-03-19 DIAGNOSIS — Z0000 Encounter for general adult medical examination without abnormal findings: Secondary | ICD-10-CM | POA: Diagnosis not present

## 2024-03-19 DIAGNOSIS — R202 Paresthesia of skin: Secondary | ICD-10-CM | POA: Diagnosis not present

## 2024-03-19 DIAGNOSIS — I2510 Atherosclerotic heart disease of native coronary artery without angina pectoris: Secondary | ICD-10-CM | POA: Diagnosis not present

## 2024-03-19 DIAGNOSIS — R531 Weakness: Secondary | ICD-10-CM | POA: Diagnosis not present

## 2024-03-19 DIAGNOSIS — E1122 Type 2 diabetes mellitus with diabetic chronic kidney disease: Secondary | ICD-10-CM | POA: Diagnosis not present

## 2024-03-19 DIAGNOSIS — N184 Chronic kidney disease, stage 4 (severe): Secondary | ICD-10-CM | POA: Diagnosis not present

## 2024-03-19 DIAGNOSIS — I495 Sick sinus syndrome: Secondary | ICD-10-CM | POA: Diagnosis not present

## 2024-03-19 DIAGNOSIS — Z23 Encounter for immunization: Secondary | ICD-10-CM | POA: Diagnosis not present

## 2024-03-19 DIAGNOSIS — I5042 Chronic combined systolic (congestive) and diastolic (congestive) heart failure: Secondary | ICD-10-CM | POA: Diagnosis not present

## 2024-03-19 DIAGNOSIS — E039 Hypothyroidism, unspecified: Secondary | ICD-10-CM | POA: Diagnosis not present

## 2024-03-19 DIAGNOSIS — Z950 Presence of cardiac pacemaker: Secondary | ICD-10-CM | POA: Diagnosis not present

## 2024-03-19 DIAGNOSIS — E785 Hyperlipidemia, unspecified: Secondary | ICD-10-CM | POA: Diagnosis not present

## 2024-03-20 DIAGNOSIS — E1122 Type 2 diabetes mellitus with diabetic chronic kidney disease: Secondary | ICD-10-CM | POA: Diagnosis not present

## 2024-03-21 DIAGNOSIS — E1140 Type 2 diabetes mellitus with diabetic neuropathy, unspecified: Secondary | ICD-10-CM | POA: Diagnosis not present

## 2024-03-21 DIAGNOSIS — I5042 Chronic combined systolic (congestive) and diastolic (congestive) heart failure: Secondary | ICD-10-CM | POA: Diagnosis not present

## 2024-03-21 DIAGNOSIS — I2510 Atherosclerotic heart disease of native coronary artery without angina pectoris: Secondary | ICD-10-CM | POA: Diagnosis not present

## 2024-03-21 DIAGNOSIS — N184 Chronic kidney disease, stage 4 (severe): Secondary | ICD-10-CM | POA: Diagnosis not present

## 2024-03-21 DIAGNOSIS — E039 Hypothyroidism, unspecified: Secondary | ICD-10-CM | POA: Diagnosis not present

## 2024-03-25 ENCOUNTER — Ambulatory Visit (INDEPENDENT_AMBULATORY_CARE_PROVIDER_SITE_OTHER): Payer: Medicare Other

## 2024-03-25 DIAGNOSIS — I495 Sick sinus syndrome: Secondary | ICD-10-CM

## 2024-03-25 LAB — CUP PACEART REMOTE DEVICE CHECK
Battery Remaining Longevity: 55 mo
Battery Remaining Percentage: 50 %
Battery Voltage: 2.98 V
Brady Statistic AP VP Percent: 29 %
Brady Statistic AP VS Percent: 1 %
Brady Statistic AS VP Percent: 70 %
Brady Statistic AS VS Percent: 1 %
Brady Statistic RA Percent Paced: 29 %
Brady Statistic RV Percent Paced: 99 %
Date Time Interrogation Session: 20251104053235
Implantable Lead Connection Status: 753985
Implantable Lead Connection Status: 753985
Implantable Lead Implant Date: 19901207
Implantable Lead Implant Date: 19901207
Implantable Lead Location: 753859
Implantable Lead Location: 753860
Implantable Pulse Generator Implant Date: 20201116
Lead Channel Impedance Value: 280 Ohm
Lead Channel Impedance Value: 530 Ohm
Lead Channel Pacing Threshold Amplitude: 0.5 V
Lead Channel Pacing Threshold Amplitude: 1.25 V
Lead Channel Pacing Threshold Pulse Width: 0.4 ms
Lead Channel Pacing Threshold Pulse Width: 0.4 ms
Lead Channel Sensing Intrinsic Amplitude: 12 mV
Lead Channel Sensing Intrinsic Amplitude: 3.4 mV
Lead Channel Setting Pacing Amplitude: 1.5 V
Lead Channel Setting Pacing Amplitude: 2 V
Lead Channel Setting Pacing Pulse Width: 0.4 ms
Lead Channel Setting Sensing Sensitivity: 3 mV
Pulse Gen Model: 2272
Pulse Gen Serial Number: 9177614

## 2024-03-29 ENCOUNTER — Ambulatory Visit: Payer: Self-pay | Admitting: Cardiovascular Disease

## 2024-03-31 NOTE — Progress Notes (Signed)
 Remote PPM Transmission

## 2024-04-07 ENCOUNTER — Other Ambulatory Visit: Payer: Self-pay | Admitting: Cardiovascular Disease

## 2024-04-08 DIAGNOSIS — I5042 Chronic combined systolic (congestive) and diastolic (congestive) heart failure: Secondary | ICD-10-CM | POA: Diagnosis not present

## 2024-04-08 DIAGNOSIS — E1140 Type 2 diabetes mellitus with diabetic neuropathy, unspecified: Secondary | ICD-10-CM | POA: Diagnosis not present

## 2024-04-08 DIAGNOSIS — N184 Chronic kidney disease, stage 4 (severe): Secondary | ICD-10-CM | POA: Diagnosis not present

## 2024-04-08 DIAGNOSIS — I2510 Atherosclerotic heart disease of native coronary artery without angina pectoris: Secondary | ICD-10-CM | POA: Diagnosis not present

## 2024-04-09 DIAGNOSIS — Z23 Encounter for immunization: Secondary | ICD-10-CM | POA: Diagnosis not present

## 2024-04-14 ENCOUNTER — Other Ambulatory Visit: Payer: Self-pay | Admitting: Cardiovascular Disease

## 2024-04-20 DIAGNOSIS — I2510 Atherosclerotic heart disease of native coronary artery without angina pectoris: Secondary | ICD-10-CM | POA: Diagnosis not present

## 2024-04-20 DIAGNOSIS — E039 Hypothyroidism, unspecified: Secondary | ICD-10-CM | POA: Diagnosis not present

## 2024-04-20 DIAGNOSIS — I5042 Chronic combined systolic (congestive) and diastolic (congestive) heart failure: Secondary | ICD-10-CM | POA: Diagnosis not present

## 2024-04-20 DIAGNOSIS — N184 Chronic kidney disease, stage 4 (severe): Secondary | ICD-10-CM | POA: Diagnosis not present

## 2024-04-20 DIAGNOSIS — E1140 Type 2 diabetes mellitus with diabetic neuropathy, unspecified: Secondary | ICD-10-CM | POA: Diagnosis not present

## 2024-04-21 ENCOUNTER — Other Ambulatory Visit (HOSPITAL_COMMUNITY): Payer: Self-pay

## 2024-05-14 ENCOUNTER — Other Ambulatory Visit: Payer: Self-pay | Admitting: Cardiovascular Disease

## 2024-06-24 ENCOUNTER — Ambulatory Visit: Payer: Medicare Other

## 2024-06-25 LAB — CUP PACEART REMOTE DEVICE CHECK
Battery Remaining Longevity: 53 mo
Battery Remaining Percentage: 47 %
Battery Voltage: 2.98 V
Brady Statistic AP VP Percent: 33 %
Brady Statistic AP VS Percent: 1 %
Brady Statistic AS VP Percent: 66 %
Brady Statistic AS VS Percent: 1 %
Brady Statistic RA Percent Paced: 33 %
Brady Statistic RV Percent Paced: 99 %
Date Time Interrogation Session: 20260203040017
Implantable Lead Connection Status: 753985
Implantable Lead Connection Status: 753985
Implantable Lead Implant Date: 19901207
Implantable Lead Implant Date: 19901207
Implantable Lead Location: 753859
Implantable Lead Location: 753860
Implantable Pulse Generator Implant Date: 20201116
Lead Channel Impedance Value: 290 Ohm
Lead Channel Impedance Value: 530 Ohm
Lead Channel Pacing Threshold Amplitude: 0.5 V
Lead Channel Pacing Threshold Amplitude: 1.375 V
Lead Channel Pacing Threshold Pulse Width: 0.4 ms
Lead Channel Pacing Threshold Pulse Width: 0.4 ms
Lead Channel Sensing Intrinsic Amplitude: 12 mV
Lead Channel Sensing Intrinsic Amplitude: 3 mV
Lead Channel Setting Pacing Amplitude: 1.625
Lead Channel Setting Pacing Amplitude: 2 V
Lead Channel Setting Pacing Pulse Width: 0.4 ms
Lead Channel Setting Sensing Sensitivity: 3 mV
Pulse Gen Model: 2272
Pulse Gen Serial Number: 9177614

## 2024-06-26 ENCOUNTER — Ambulatory Visit: Payer: Self-pay | Admitting: Cardiovascular Disease

## 2024-09-01 ENCOUNTER — Ambulatory Visit: Admitting: Physician Assistant

## 2024-09-23 ENCOUNTER — Ambulatory Visit

## 2024-12-23 ENCOUNTER — Ambulatory Visit

## 2025-03-24 ENCOUNTER — Ambulatory Visit

## 2025-06-23 ENCOUNTER — Ambulatory Visit

## 2025-09-22 ENCOUNTER — Ambulatory Visit
